# Patient Record
Sex: Male | Born: 1945 | Race: White | Hispanic: No | Marital: Married | Smoking: Never smoker
Health system: Southern US, Community
[De-identification: ages and names within clinical notes are randomized; demographics above are authoritative.]

## PROBLEM LIST (undated history)

## (undated) DIAGNOSIS — R634 Abnormal weight loss: Secondary | ICD-10-CM

## (undated) DIAGNOSIS — Z9889 Other specified postprocedural states: Secondary | ICD-10-CM

## (undated) DIAGNOSIS — E119 Type 2 diabetes mellitus without complications: Secondary | ICD-10-CM

## (undated) DIAGNOSIS — I10 Essential (primary) hypertension: Secondary | ICD-10-CM

## (undated) DIAGNOSIS — L299 Pruritus, unspecified: Secondary | ICD-10-CM

## (undated) DIAGNOSIS — N058 Unspecified nephritic syndrome with other morphologic changes: Secondary | ICD-10-CM

## (undated) DIAGNOSIS — K831 Obstruction of bile duct: Secondary | ICD-10-CM

## (undated) DIAGNOSIS — C259 Malignant neoplasm of pancreas, unspecified: Secondary | ICD-10-CM

## (undated) DIAGNOSIS — R748 Abnormal levels of other serum enzymes: Secondary | ICD-10-CM

## (undated) DIAGNOSIS — C4490 Unspecified malignant neoplasm of skin, unspecified: Secondary | ICD-10-CM

## (undated) DIAGNOSIS — E785 Hyperlipidemia, unspecified: Secondary | ICD-10-CM

## (undated) DIAGNOSIS — R112 Nausea with vomiting, unspecified: Secondary | ICD-10-CM

## (undated) DIAGNOSIS — G2581 Restless legs syndrome: Secondary | ICD-10-CM

## (undated) DIAGNOSIS — K219 Gastro-esophageal reflux disease without esophagitis: Secondary | ICD-10-CM

## (undated) HISTORY — DX: Essential (primary) hypertension: I10

## (undated) HISTORY — PX: TONSILLECTOMY: SUR1361

## (undated) HISTORY — PX: INGUINAL HERNIA REPAIR: SUR1180

## (undated) HISTORY — PX: COLONOSCOPY: SHX174

## (undated) HISTORY — PX: BACK SURGERY: SHX140

## (undated) HISTORY — PX: WISDOM TOOTH EXTRACTION: SHX21

## (undated) HISTORY — DX: Hyperlipidemia, unspecified: E78.5

---

## 1992-10-22 HISTORY — PX: ANTERIOR CERVICAL DECOMP/DISCECTOMY FUSION: SHX1161

## 2005-07-05 ENCOUNTER — Ambulatory Visit (HOSPITAL_BASED_OUTPATIENT_CLINIC_OR_DEPARTMENT_OTHER): Admission: RE | Admit: 2005-07-05 | Discharge: 2005-07-05 | Payer: Self-pay | Admitting: Orthopedic Surgery

## 2005-07-05 ENCOUNTER — Ambulatory Visit (HOSPITAL_COMMUNITY): Admission: RE | Admit: 2005-07-05 | Discharge: 2005-07-05 | Payer: Self-pay | Admitting: Orthopedic Surgery

## 2006-11-08 ENCOUNTER — Ambulatory Visit (HOSPITAL_COMMUNITY): Admission: RE | Admit: 2006-11-08 | Discharge: 2006-11-08 | Payer: Self-pay | Admitting: *Deleted

## 2012-10-22 HISTORY — PX: SHOULDER ARTHROSCOPY W/ ROTATOR CUFF REPAIR: SHX2400

## 2015-05-23 ENCOUNTER — Other Ambulatory Visit: Payer: Self-pay | Admitting: Family Medicine

## 2015-05-24 ENCOUNTER — Other Ambulatory Visit: Payer: Self-pay | Admitting: Family Medicine

## 2015-05-24 NOTE — Telephone Encounter (Signed)
ok 

## 2015-05-24 NOTE — Telephone Encounter (Signed)
Pt called and states that he is a friend of yours and would like to have this medication filled. He has NOT been seen here but does have an appt scheduled for later this month. ? OK to Refill

## 2015-05-25 ENCOUNTER — Other Ambulatory Visit: Payer: Self-pay | Admitting: Family Medicine

## 2015-05-25 MED ORDER — INSULIN ASPART 100 UNIT/ML FLEXPEN: 20.0000 [IU] | PEN_INJECTOR | Freq: Two times a day (BID) | SUBCUTANEOUS | Status: DC

## 2015-05-25 MED ORDER — INSULIN DETEMIR 100 UNIT/ML FLEXPEN: 20.0000 [IU] | PEN_INJECTOR | Freq: Every day | SUBCUTANEOUS | Status: DC

## 2015-05-25 NOTE — Telephone Encounter (Signed)
Medication refilled per protocol.  Verbal OK from provider

## 2015-06-16 ENCOUNTER — Encounter: Payer: Self-pay | Admitting: Family Medicine

## 2015-06-16 ENCOUNTER — Ambulatory Visit (INDEPENDENT_AMBULATORY_CARE_PROVIDER_SITE_OTHER): Payer: Medicare Other | Admitting: Family Medicine

## 2015-06-16 VITALS — BP 104/68 | HR 62 | Temp 98.2°F | Resp 14 | Ht 72.0 in | Wt 176.0 lb

## 2015-06-16 DIAGNOSIS — Z794 Long term (current) use of insulin: Secondary | ICD-10-CM | POA: Diagnosis not present

## 2015-06-16 DIAGNOSIS — Z7189 Other specified counseling: Secondary | ICD-10-CM

## 2015-06-16 DIAGNOSIS — Z23 Encounter for immunization: Secondary | ICD-10-CM

## 2015-06-16 DIAGNOSIS — Z0000 Encounter for general adult medical examination without abnormal findings: Secondary | ICD-10-CM | POA: Diagnosis not present

## 2015-06-16 DIAGNOSIS — Z7689 Persons encountering health services in other specified circumstances: Secondary | ICD-10-CM

## 2015-06-16 DIAGNOSIS — E119 Type 2 diabetes mellitus without complications: Secondary | ICD-10-CM

## 2015-06-16 DIAGNOSIS — IMO0001 Reserved for inherently not codable concepts without codable children: Secondary | ICD-10-CM

## 2015-06-16 LAB — LIPID PANEL
Cholesterol: 151 mg/dL (ref 125–200)
HDL: 44 mg/dL (ref >=40)
LDL CALC: 90 mg/dL (ref <130)
Total CHOL/HDL Ratio: 3.4 Ratio (ref <=5.0)
Triglycerides: 87 mg/dL (ref <150)
VLDL: 17 mg/dL (ref <30)

## 2015-06-16 LAB — CBC WITH DIFFERENTIAL/PLATELET
BASOS ABS: 0.1 10*3/uL (ref 0.0–0.1)
Basophils Relative: 1 % (ref 0–1)
EOS ABS: 0.3 10*3/uL (ref 0.0–0.7)
Eosinophils Relative: 4 % (ref 0–5)
HCT: 42 % (ref 39.0–52.0)
Hemoglobin: 13.9 g/dL (ref 13.0–17.0)
Lymphocytes Relative: 20 % (ref 12–46)
Lymphs Abs: 1.6 10*3/uL (ref 0.7–4.0)
MCH: 29.1 pg (ref 26.0–34.0)
MCHC: 33.1 g/dL (ref 30.0–36.0)
MCV: 88.1 fL (ref 78.0–100.0)
MPV: 9.7 fL (ref 8.6–12.4)
Monocytes Absolute: 0.8 10*3/uL (ref 0.1–1.0)
Monocytes Relative: 10 % (ref 3–12)
Neutro Abs: 5.3 10*3/uL (ref 1.7–7.7)
Neutrophils Relative %: 65 % (ref 43–77)
PLATELETS: 210 10*3/uL (ref 150–400)
RBC: 4.77 MIL/uL (ref 4.22–5.81)
RDW: 14.3 % (ref 11.5–15.5)
WBC: 8.1 10*3/uL (ref 4.0–10.5)

## 2015-06-16 LAB — COMPLETE METABOLIC PANEL WITH GFR
ALT: 25 U/L (ref 9–46)
AST: 25 U/L (ref 10–35)
Albumin: 4.5 g/dL (ref 3.6–5.1)
Alkaline Phosphatase: 100 U/L (ref 40–115)
BUN: 26 mg/dL — ABNORMAL HIGH (ref 7–25)
CO2: 25 mmol/L (ref 20–31)
Calcium: 9.5 mg/dL (ref 8.6–10.3)
Chloride: 100 mmol/L (ref 98–110)
Creat: 1.3 mg/dL — ABNORMAL HIGH (ref 0.70–1.25)
GFR, EST NON AFRICAN AMERICAN: 56 mL/min — AB (ref >=60)
GFR, Est African American: 65 mL/min (ref >=60)
GLUCOSE: 162 mg/dL — AB (ref 70–99)
Potassium: 5.2 mmol/L (ref 3.5–5.3)
SODIUM: 138 mmol/L (ref 135–146)
TOTAL PROTEIN: 7.1 g/dL (ref 6.1–8.1)
Total Bilirubin: 0.6 mg/dL (ref 0.2–1.2)

## 2015-06-16 LAB — HEMOGLOBIN A1C
HEMOGLOBIN A1C: 8.3 % — AB (ref <5.7)
Mean Plasma Glucose: 192 mg/dL — ABNORMAL HIGH (ref <117)

## 2015-06-16 NOTE — Progress Notes (Signed)
Subjective:    Patient ID: Jacob Hardin, male    DOB: 06-25-1946, 69 y.o.   MRN: [id number]  HPI  Patient is a very pleasant 69 year old white male who arrives known since I was a child. 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 Past medical history is significant for hypertension, hyperlipidemia, and insulin-dependent diabetes mellitus. Patient states that up until 2 weeks ago, his fasting blood sugars are all less than 130. However he recently went on a cruise to [place] and he admits to dietary indiscretion and inactivity. With that, his fasting blood sugars have risen substantially but this is just been recently. He denies any hypoglycemia. He denies any chest pain shortness of breath or dyspnea on exertion. Patient has had Pneumovax 23 as well as the shingles vaccine. He is due for Prevnar 13. He states that his prostate exam and PSA have been less than 12 months ago and we will defer that today. He had a colonoscopy within the last 5 years and was told that he is good for 10 years. Past Medical History  Diagnosis Date  . Diabetes mellitus without complication   . Hypertension   . Hyperlipidemia    History reviewed. No pertinent past surgical history. Current Outpatient Prescriptions on File Prior to Visit  Medication Sig Dispense Refill  . insulin aspart (NOVOLOG) 100 UNIT/ML FlexPen Inject 20 Units into the skin 2 (two) times daily with a meal. (Patient taking differently: Inject 8 Units into the skin daily. Before dinner) 15 mL 0  . Insulin Detemir (LEVEMIR FLEXTOUCH) 100 UNIT/ML Pen Inject 20 Units into the skin daily at 10 pm. (Patient taking differently: Inject 20 Units into the skin daily with breakfast. ) 15 mL 0   No current facility-administered medications on file prior to visit.   Allergies  Allergen Reactions  . Codeine Nausea Only   Social History   Social History  . Marital Status: Married    Spouse Name: N/A  . Number of  Children: N/A  . Years of Education: N/A   Occupational History  . Not on file.   Social History Main Topics  . Smoking status: Never Smoker   . Smokeless tobacco: Never Used  . Alcohol Use: No  . Drug Use: No  . Sexual Activity: Yes     Comment: married to [place].  Retired Corporate investment banker   Other Topics Concern  . Not on file   Social History Narrative  . No narrative on file   Family History  Problem Relation Age of Onset  . Diabetes Mother   . Hypertension Mother   . Cancer Father     lung 59  . Hypertension Father   . Cancer Brother     pancreatic   . Cancer Paternal Uncle     prostate  . Cancer Paternal Grandfather     colon     Review of Systems  All other systems reviewed and are negative.      Objective:   Physical Exam  Constitutional: He is oriented to person, place, and time. He appears well-developed and well-nourished. No distress.  HENT:  Head: Normocephalic and atraumatic.  Right Ear: External ear normal.  Left Ear: External ear normal.  Nose: Nose normal.  Mouth/Throat: Oropharynx is clear and moist. No oropharyngeal exudate.  Eyes: Conjunctivae and EOM are normal. Pupils are equal, round, and reactive to light. Right eye exhibits no discharge. Left eye  exhibits no discharge. No scleral icterus.  Neck: Normal range of motion. Neck supple. No JVD present. No tracheal deviation present. No thyromegaly present.  Cardiovascular: Normal rate, regular rhythm, normal heart sounds and intact distal pulses.  Exam reveals no gallop and no friction rub.   No murmur heard. Pulmonary/Chest: Effort normal and breath sounds normal. No stridor. No respiratory distress. He has no wheezes. He has no rales. He exhibits no tenderness.  Abdominal: Soft. Bowel sounds are normal. He exhibits no distension and no mass. There is no tenderness. There is no rebound and no guarding.  Musculoskeletal: Normal range of motion. He exhibits no edema or  tenderness.  Lymphadenopathy:    He has no cervical adenopathy.  Neurological: He is alert and oriented to person, place, and time. He has normal reflexes. He displays normal reflexes. No cranial nerve deficit. He exhibits normal muscle tone. Coordination normal.  Skin: Skin is warm. No rash noted. He is not diaphoretic. No erythema. No pallor.  Psychiatric: He has a normal mood and affect. His behavior is normal. Judgment and thought content normal.  Vitals reviewed.         Assessment & Plan:  IDDM (insulin dependent diabetes mellitus) - Plan: CBC with Differential/Platelet, COMPLETE METABOLIC PANEL WITH GFR, Lipid panel, Hemoglobin A1c, Microalbumin, urine  Routine general medical examination at a health care facility - Plan: PSA, Medicare  Establishing care with new doctor, encounter for  patient's blood pressure is outstanding. I will check a CBC, CMP, fasting lipid panel, hemoglobin A1c, and urine microalbumin. I will defer the rectal exam and a PSA until his follow-up visit in 3 months. I recommended diet exercise and lifestyle changes to address his increased fasting blood sugar. He is to check his sugar twice daily and notify me in one month. If the sugars not starting to decline, I would increase his Levemir until his fasting blood sugar was less than 130. Patient will receive Prevnar 13 today. The remainder of his preventative care is up-to-date.

## 2015-06-17 LAB — MICROALBUMIN, URINE: Microalb, Ur: 5.5 mg/dL — ABNORMAL HIGH (ref <2.0)

## 2015-06-22 ENCOUNTER — Encounter: Payer: Self-pay | Admitting: Family Medicine

## 2015-06-22 DIAGNOSIS — E785 Hyperlipidemia, unspecified: Secondary | ICD-10-CM | POA: Insufficient documentation

## 2015-06-22 DIAGNOSIS — E119 Type 2 diabetes mellitus without complications: Secondary | ICD-10-CM | POA: Insufficient documentation

## 2015-06-22 DIAGNOSIS — I10 Essential (primary) hypertension: Secondary | ICD-10-CM | POA: Insufficient documentation

## 2015-07-26 ENCOUNTER — Other Ambulatory Visit: Payer: Self-pay | Admitting: Family Medicine

## 2015-07-28 ENCOUNTER — Other Ambulatory Visit: Payer: Self-pay | Admitting: Family Medicine

## 2015-07-28 NOTE — Telephone Encounter (Signed)
Refill appropriate and filled per protocol. 

## 2015-09-16 ENCOUNTER — Ambulatory Visit: Payer: Medicare Other | Admitting: Family Medicine

## 2015-09-19 ENCOUNTER — Encounter: Payer: Self-pay | Admitting: Family Medicine

## 2015-09-19 ENCOUNTER — Ambulatory Visit (INDEPENDENT_AMBULATORY_CARE_PROVIDER_SITE_OTHER): Payer: Medicare Other | Admitting: Family Medicine

## 2015-09-19 VITALS — BP 152/78 | HR 74 | Temp 97.7°F | Resp 16 | Ht 72.0 in | Wt 177.0 lb

## 2015-09-19 DIAGNOSIS — Z794 Long term (current) use of insulin: Secondary | ICD-10-CM | POA: Diagnosis not present

## 2015-09-19 DIAGNOSIS — I10 Essential (primary) hypertension: Secondary | ICD-10-CM | POA: Diagnosis not present

## 2015-09-19 DIAGNOSIS — E119 Type 2 diabetes mellitus without complications: Secondary | ICD-10-CM | POA: Diagnosis not present

## 2015-09-19 LAB — CBC WITH DIFFERENTIAL/PLATELET
BASOS ABS: 0 10*3/uL (ref 0.0–0.1)
Basophils Relative: 0 % (ref 0–1)
EOS ABS: 0.2 10*3/uL (ref 0.0–0.7)
EOS PCT: 2 % (ref 0–5)
HEMATOCRIT: 39.8 % (ref 39.0–52.0)
Hemoglobin: 13.4 g/dL (ref 13.0–17.0)
LYMPHS ABS: 1.6 10*3/uL (ref 0.7–4.0)
LYMPHS PCT: 18 % (ref 12–46)
MCH: 29.3 pg (ref 26.0–34.0)
MCHC: 33.7 g/dL (ref 30.0–36.0)
MCV: 87.1 fL (ref 78.0–100.0)
MONOS PCT: 10 % (ref 3–12)
MPV: 9.3 fL (ref 8.6–12.4)
Monocytes Absolute: 0.9 10*3/uL (ref 0.1–1.0)
Neutro Abs: 6.3 10*3/uL (ref 1.7–7.7)
Neutrophils Relative %: 70 % (ref 43–77)
Platelets: 250 10*3/uL (ref 150–400)
RBC: 4.57 MIL/uL (ref 4.22–5.81)
RDW: 14.3 % (ref 11.5–15.5)
WBC: 9 10*3/uL (ref 4.0–10.5)

## 2015-09-19 LAB — COMPLETE METABOLIC PANEL WITH GFR
ALT: 23 U/L (ref 9–46)
AST: 27 U/L (ref 10–35)
Albumin: 4.5 g/dL (ref 3.6–5.1)
Alkaline Phosphatase: 174 U/L — ABNORMAL HIGH (ref 40–115)
BILIRUBIN TOTAL: 0.5 mg/dL (ref 0.2–1.2)
BUN: 15 mg/dL (ref 7–25)
CALCIUM: 9.9 mg/dL (ref 8.6–10.3)
CO2: 28 mmol/L (ref 20–31)
CREATININE: 0.97 mg/dL (ref 0.70–1.25)
Chloride: 101 mmol/L (ref 98–110)
GFR, EST NON AFRICAN AMERICAN: 80 mL/min (ref >=60)
Glucose, Bld: 155 mg/dL — ABNORMAL HIGH (ref 70–99)
Potassium: 5.4 mmol/L — ABNORMAL HIGH (ref 3.5–5.3)
Sodium: 140 mmol/L (ref 135–146)
TOTAL PROTEIN: 7.1 g/dL (ref 6.1–8.1)

## 2015-09-19 LAB — HEMOGLOBIN A1C
HEMOGLOBIN A1C: 7 % — AB (ref <5.7)
MEAN PLASMA GLUCOSE: 154 mg/dL — AB (ref <117)

## 2015-09-19 LAB — LIPID PANEL
CHOLESTEROL: 141 mg/dL (ref 125–200)
HDL: 44 mg/dL (ref >=40)
LDL Cholesterol: 80 mg/dL (ref <130)
Total CHOL/HDL Ratio: 3.2 Ratio (ref <=5.0)
Triglycerides: 85 mg/dL (ref <150)
VLDL: 17 mg/dL (ref <30)

## 2015-09-19 NOTE — Progress Notes (Signed)
Subjective:    Patient ID: Jacob Hardin, male    DOB: 05/09/1946, 69 y.o.   MRN: [id number]  HPI  06/16/15 Patient is a very pleasant 69 year old white male who arrives known since I was a child. 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 Past medical history is significant for hypertension, hyperlipidemia, and insulin-dependent diabetes mellitus. Patient states that up until 2 weeks ago, his fasting blood sugars are all less than 130. However he recently went on a cruise to [place] and he admits to dietary indiscretion and inactivity. With that, his fasting blood sugars have risen substantially but this is just been recently. He denies any hypoglycemia. He denies any chest pain shortness of breath or dyspnea on exertion. Patient has had Pneumovax 23 as well as the shingles vaccine. He is due for Prevnar 13. He states that his prostate exam and PSA have been less than 12 months ago and we will defer that today. He had a colonoscopy within the last 5 years and was told that he is good for 10 years.  At that time, my plan was: patient's blood pressure is outstanding. I will check a CBC, CMP, fasting lipid panel, hemoglobin A1c, and urine microalbumin. I will defer the rectal exam and a PSA until his follow-up visit in 3 months. I recommended diet exercise and lifestyle changes to address his increased fasting blood sugar. He is to check his sugar twice daily and notify me in one month. If the sugars not starting to decline, I would increase his Levemir until his fasting blood sugar was less than 130. Patient will receive Prevnar 13 today. The remainder of his preventative care is up-to-date.  09/19/15 Here today for follow up.  In August, HgA1c was 8.3 and the remainder of his lab work was normal.  Since I last saw the patient, he has increased his insulin Levemir to 37 units every day. He continues to take 7 units of NovoLog with meals. His fasting blood sugar  ranges between 75 and 145. The majority are between 120 and 130. His two-hour postprandial sugars are between 130 and 160. He does have an occasional 190. However his sugars have improved dramatically since his last office visit. Patient states that he has had his flu shot this year. He is here today to recheck fasting lab work. Past Medical History  Diagnosis Date  . Diabetes mellitus without complication   . Hyperlipidemia   . Hypertension    Past Surgical History  Procedure Laterality Date  . Spine surgery  1994    cervical  . Rotator cuff repair  2014   Current Outpatient Prescriptions on File Prior to Visit  Medication Sig Dispense Refill  . aspirin 81 MG tablet Take 81 mg by mouth daily.    . BD PEN NEEDLE NANO U/F 32G X 4 MM MISC USE TO INJECT INSULIN TWICE A DAY 200 each 3  . Glucosamine-Chondroitin (MOVE FREE PO) Take 2 tablets by mouth every morning.    . insulin aspart (NOVOLOG) 100 UNIT/ML FlexPen Inject 20 Units into the skin 2 (two) times daily with a meal. (Patient taking differently: Inject 8 Units into the skin daily. Before dinner) 15 mL 0  . Insulin Detemir (LEVEMIR FLEXTOUCH) 100 UNIT/ML Pen Inject 20 Units into the skin daily at 10 pm. (Patient taking differently: Inject 20 Units into the skin daily with breakfast. ) 15 mL 0  . JANUMET XR 706 [id number] MG  TB24 Take 1 tablet by mouth daily.  4  . LEVEMIR FLEXTOUCH 100 UNIT/ML Pen INJECT 20 UNITS SUBCUTANEOUSLY DAILY 45 mL 3  . magnesium gluconate (MAGONATE) 500 MG tablet Take 1,000 mg by mouth daily.    . Multiple Vitamin (MULTIVITAMIN) tablet Take 1 tablet by mouth daily. CentrumSilver    . pravastatin (PRAVACHOL) 80 MG tablet Take 80 mg by mouth daily.  3  . telmisartan-hydrochlorothiazide (MICARDIS HCT) 80-12.5 MG per tablet Take 1 tablet by mouth daily.  11  . vitamin C (ASCORBIC ACID) 500 MG tablet Take 500 mg by mouth daily as needed.     No current facility-administered medications on file prior to visit.    Allergies  Allergen Reactions  . Codeine Nausea Only   Social History   Social History  . Marital Status: Married    Spouse Name: N/A  . Number of Children: N/A  . Years of Education: N/A   Occupational History  . Not on file.   Social History Main Topics  . Smoking status: Never Smoker   . Smokeless tobacco: Never Used  . Alcohol Use: No  . Drug Use: No  . Sexual Activity: Yes    Birth Control/ Protection: None     Comment: married to [place].  Retired Corporate investment banker   Other Topics Concern  . Not on file   Social History Narrative   Family History  Problem Relation Age of Onset  . Diabetes Mother   . Hypertension Mother   . Cancer Father     lung 88  . Hypertension Father   . Cancer Brother     pancreatic   . Diabetes Brother   . Cancer Paternal Uncle     prostate  . Cancer Paternal Grandfather     colon     Review of Systems  All other systems reviewed and are negative.      Objective:   Physical Exam  Constitutional: He is oriented to person, place, and time. He appears well-developed and well-nourished. No distress.  HENT:  Head: Normocephalic and atraumatic.  Right Ear: External ear normal.  Left Ear: External ear normal.  Nose: Nose normal.  Mouth/Throat: Oropharynx is clear and moist. No oropharyngeal exudate.  Eyes: Conjunctivae and EOM are normal. Pupils are equal, round, and reactive to light. Right eye exhibits no discharge. Left eye exhibits no discharge. No scleral icterus.  Neck: Normal range of motion. Neck supple. No JVD present. No tracheal deviation present. No thyromegaly present.  Cardiovascular: Normal rate, regular rhythm, normal heart sounds and intact distal pulses.  Exam reveals no gallop and no friction rub.   No murmur heard. Pulmonary/Chest: Effort normal and breath sounds normal. No stridor. No respiratory distress. He has no wheezes. He has no rales. He exhibits no tenderness.  Abdominal: Soft. Bowel  sounds are normal. He exhibits no distension and no mass. There is no tenderness. There is no rebound and no guarding.  Musculoskeletal: Normal range of motion. He exhibits no edema or tenderness.  Lymphadenopathy:    He has no cervical adenopathy.  Neurological: He is alert and oriented to person, place, and time. He has normal reflexes. No cranial nerve deficit. He exhibits normal muscle tone. Coordination normal.  Skin: Skin is warm. No rash noted. He is not diaphoretic. No erythema. No pallor.  Psychiatric: He has a normal mood and affect. His behavior is normal. Judgment and thought content normal.  Vitals reviewed.  Assessment & Plan:  Diabetes mellitus, type II, insulin dependent ([place]) - Plan: CBC with Differential/Platelet, COMPLETE METABOLIC PANEL WITH GFR, Hemoglobin A1c, Lipid panel, Microalbumin, urine  Benign essential HTN  I am very pleased with his blood sugars. I will recheck a hemoglobin A1c. Goal hemoglobin A1c is less than 7. For the last 2 months, his sugars have been within a range that should allow his hemoglobin A1c to fall below 7. Therefore I will make no changes in his insulin at this time. I'll also check a urine microalbumin as well as an LDL cholesterol. Patient recently had Prevnar 57. He reports that his flu shot is up-to-date. His blood pressure today is slightly elevated however on recheck it is normal and he states that it runs normal at home.  He does report a 6 week history of occasional scratchy cough which he attributes to allergies. I recommended trying Zyrtec 10 mg by mouth daily for that.

## 2015-09-20 LAB — MICROALBUMIN, URINE: Microalb, Ur: 5 mg/dL

## 2015-09-28 ENCOUNTER — Encounter: Payer: Self-pay | Admitting: Family Medicine

## 2015-09-30 ENCOUNTER — Other Ambulatory Visit: Payer: Self-pay | Admitting: Family Medicine

## 2015-09-30 DIAGNOSIS — R748 Abnormal levels of other serum enzymes: Secondary | ICD-10-CM

## 2015-10-25 ENCOUNTER — Other Ambulatory Visit: Payer: Self-pay | Admitting: Family Medicine

## 2015-10-25 NOTE — Telephone Encounter (Signed)
Medication called/sent to requested pharmacy  

## 2015-11-03 ENCOUNTER — Other Ambulatory Visit: Payer: Medicare Other

## 2015-11-03 DIAGNOSIS — R748 Abnormal levels of other serum enzymes: Secondary | ICD-10-CM

## 2015-11-03 LAB — COMPLETE METABOLIC PANEL WITH GFR
ALT: 150 U/L — AB (ref 9–46)
AST: 107 U/L — ABNORMAL HIGH (ref 10–35)
Albumin: 4.2 g/dL (ref 3.6–5.1)
Alkaline Phosphatase: 365 U/L — ABNORMAL HIGH (ref 40–115)
BUN: 20 mg/dL (ref 7–25)
CALCIUM: 9.5 mg/dL (ref 8.6–10.3)
CHLORIDE: 100 mmol/L (ref 98–110)
CO2: 28 mmol/L (ref 20–31)
CREATININE: 0.98 mg/dL (ref 0.70–1.25)
GFR, EST NON AFRICAN AMERICAN: 79 mL/min (ref >=60)
Glucose, Bld: 101 mg/dL — ABNORMAL HIGH (ref 70–99)
Potassium: 4.3 mmol/L (ref 3.5–5.3)
Sodium: 139 mmol/L (ref 135–146)
Total Bilirubin: 0.7 mg/dL (ref 0.2–1.2)
Total Protein: 6.8 g/dL (ref 6.1–8.1)

## 2015-11-04 ENCOUNTER — Other Ambulatory Visit: Payer: Self-pay | Admitting: Family Medicine

## 2015-11-04 DIAGNOSIS — R945 Abnormal results of liver function studies: Secondary | ICD-10-CM

## 2015-11-07 ENCOUNTER — Telehealth: Payer: Self-pay | Admitting: *Deleted

## 2015-11-07 MED ORDER — INSULIN LISPRO 100 UNIT/ML (KWIKPEN): 4.0000 [IU] | PEN_INJECTOR | Freq: Every day | SUBCUTANEOUS | Status: DC

## 2015-11-07 NOTE — Telephone Encounter (Signed)
Pt wife called and aware of appt

## 2015-11-07 NOTE — Telephone Encounter (Signed)
Pt has appt scheduled for Friday Jan 20th at 10:00am pt is to arrive at 9:40am at [facility] on [address]. Lmtrc to pt for appt information

## 2015-11-07 NOTE — Telephone Encounter (Signed)
Received call from patient wife Butch Penny.   Reports that patient insurance will no longer cover Novolog. States that insurance will cover Humalog.   Requested prescription for Humalog.   Prescription sent to pharmacy.

## 2015-11-10 ENCOUNTER — Ambulatory Visit (INDEPENDENT_AMBULATORY_CARE_PROVIDER_SITE_OTHER): Payer: Medicare Other | Admitting: Family Medicine

## 2015-11-10 ENCOUNTER — Encounter: Payer: Self-pay | Admitting: Family Medicine

## 2015-11-10 VITALS — BP 100/60 | HR 74 | Temp 97.9°F | Resp 14 | Ht 72.0 in | Wt 172.0 lb

## 2015-11-10 DIAGNOSIS — R945 Abnormal results of liver function studies: Principal | ICD-10-CM

## 2015-11-10 DIAGNOSIS — R7989 Other specified abnormal findings of blood chemistry: Secondary | ICD-10-CM | POA: Diagnosis not present

## 2015-11-10 NOTE — Progress Notes (Signed)
Subjective:    Patient ID: Jacob Hardin, male    DOB: 1945/12/01, 70 y.o.   MRN: [id number]  HPI  06/16/15 Patient is a very pleasant 70 year old white male who arrives known since I was a child. 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 Past medical history is significant for hypertension, hyperlipidemia, and insulin-dependent diabetes mellitus. Patient states that up until 2 weeks ago, his fasting blood sugars are all less than 130. However he recently went on a cruise to [place] and he admits to dietary indiscretion and inactivity. With that, his fasting blood sugars have risen substantially but this is just been recently. He denies any hypoglycemia. He denies any chest pain shortness of breath or dyspnea on exertion. Patient has had Pneumovax 23 as well as the shingles vaccine. He is due for Prevnar 13. He states that his prostate exam and PSA have been less than 12 months ago and we will defer that today. He had a colonoscopy within the last 5 years and was told that he is good for 10 years.  At that time, my plan was: patient's blood pressure is outstanding. I will check a CBC, CMP, fasting lipid panel, hemoglobin A1c, and urine microalbumin. I will defer the rectal exam and a PSA until his follow-up visit in 3 months. I recommended diet exercise and lifestyle changes to address his increased fasting blood sugar. He is to check his sugar twice daily and notify me in one month. If the sugars not starting to decline, I would increase his Levemir until his fasting blood sugar was less than 130. Patient will receive Prevnar 13 today. The remainder of his preventative care is up-to-date.  09/19/15 Here today for follow up.  In August, HgA1c was 8.3 and the remainder of his lab work was normal.  Since I last saw the patient, he has increased his insulin Levemir to 37 units every day. He continues to take 7 units of NovoLog with meals. His fasting blood sugar  ranges between 75 and 145. The majority are between 120 and 130. His two-hour postprandial sugars are between 130 and 160. He does have an occasional 190. However his sugars have improved dramatically since his last office visit. Patient states that he has had his flu shot this year. He is here today to recheck fasting lab work.  At that time, my plan was: I am very pleased with his blood sugars. I will recheck a hemoglobin A1c. Goal hemoglobin A1c is less than 7. For the last 2 months, his sugars have been within a range that should allow his hemoglobin A1c to fall below 7. Therefore I will make no changes in his insulin at this time. I'll also check a urine microalbumin as well as an LDL cholesterol. Patient recently had Prevnar 63. He reports that his flu shot is up-to-date. His blood pressure today is slightly elevated however on recheck it is normal and he states that it runs normal at home.  He does report a 6 week history of occasional scratchy cough which he attributes to allergies. I recommended trying Zyrtec 10 mg by mouth daily for that.   11/10/15 Appointment on 11/03/2015  Component Date Value Ref Range Status  . Sodium 11/03/2015 139  135 - 146 mmol/L Final  . Potassium 11/03/2015 4.3  3.5 - 5.3 mmol/L Final  . Chloride 11/03/2015 100  98 - 110 mmol/L Final  . CO2 11/03/2015 28  20 -  31 mmol/L Final  . Glucose, Bld 11/03/2015 101* 70 - 99 mg/dL Final  . BUN 11/03/2015 20  7 - 25 mg/dL Final  . Creat 11/03/2015 0.98  0.70 - 1.25 mg/dL Final  . Total Bilirubin 11/03/2015 0.7  0.2 - 1.2 mg/dL Final  . Alkaline Phosphatase 11/03/2015 365* 40 - 115 U/L Final  . AST 11/03/2015 107* 10 - 35 U/L Final  . ALT 11/03/2015 150* 9 - 46 U/L Final  . Total Protein 11/03/2015 6.8  6.1 - 8.1 g/dL Final  . Albumin 11/03/2015 4.2  3.6 - 5.1 g/dL Final  . Calcium 11/03/2015 9.5  8.6 - 10.3 mg/dL Final  . GFR, Est African American 11/03/2015 >89  >=60 mL/min Final  . GFR, Est Non African American  11/03/2015 79  >=60 mL/min Final   Comment:   The estimated GFR is a calculation valid for adults (>=19 years old) that uses the CKD-EPI algorithm to adjust for age and sex. It is   not to be used for children, pregnant women, hospitalized patients,    patients on dialysis, or with rapidly changing kidney function. According to the NKDEP, eGFR >89 is normal, 60-89 shows mild impairment, 30-59 shows moderate impairment, 15-29 shows severe impairment and <15 is ESRD.     Liver function tests have subsequently returned elevated prompting me to have him hold pravastatin and order RUQ [place] and viral hepatitis panel.  Since I last saw Mortimer Fries, he has lost 5 pounds. He states that he has very little appetite. He denies any abdominal pain. He denies any black tarry stools or blood in his stool. He denies any fevers. However with the weight loss, his blood pressure is extremely low at 100/60. He does report symptoms consistent with orthostatic hypotension. He has discontinued Tylenol as well as pravastatin. He is also decreased his quick-acting insulin due to hypoglycemia which he has experienced due to the decreased appetite. He has right upper quadrant ultrasound scheduled for first thing in the morning. Viral hepatitis panel has not been checked yet.  Past Medical History  Diagnosis Date  . Diabetes mellitus without complication ([place])   . Hyperlipidemia   . Hypertension    Past Surgical History  Procedure Laterality Date  . Spine surgery  1994    cervical  . Rotator cuff repair  2014   Current Outpatient Prescriptions on File Prior to Visit  Medication Sig Dispense Refill  . aspirin 81 MG tablet Take 81 mg by mouth daily.    . BD PEN NEEDLE NANO U/F 32G X 4 MM MISC USE TO INJECT INSULIN TWICE A DAY 200 each 3  . Glucosamine-Chondroitin (MOVE FREE PO) Take 2 tablets by mouth every morning.    . Insulin Detemir (LEVEMIR FLEXTOUCH) 100 UNIT/ML Pen Inject 20 Units into the skin daily at 10 pm.  (Patient taking differently: Inject 37 Units into the skin daily with breakfast. ) 15 mL 0  . insulin lispro (HUMALOG KWIKPEN) 100 UNIT/ML KiwkPen Inject 0.04 mLs (4 Units total) into the skin daily. 15 mL 11  . JANUMET XR [phone] MG TB24 Take 1 tablet by mouth daily.  4  . magnesium gluconate (MAGONATE) 500 MG tablet Take 1,000 mg by mouth daily.    . Multiple Vitamin (MULTIVITAMIN) tablet Take 1 tablet by mouth daily. CentrumSilver    . pravastatin (PRAVACHOL) 80 MG tablet Take 80 mg by mouth daily.  3  . telmisartan-hydrochlorothiazide (MICARDIS HCT) 80-12.5 MG tablet TAKE 1 TABLET DAILY 30 tablet 11  .  vitamin C (ASCORBIC ACID) 500 MG tablet Take 500 mg by mouth daily as needed.     No current facility-administered medications on file prior to visit.   Allergies  Allergen Reactions  . Codeine Nausea Only   Social History   Social History  . Marital Status: Married    Spouse Name: N/A  . Number of Children: N/A  . Years of Education: N/A   Occupational History  . Not on file.   Social History Main Topics  . Smoking status: Never Smoker   . Smokeless tobacco: Never Used  . Alcohol Use: No  . Drug Use: No  . Sexual Activity: Yes    Birth Control/ Protection: None     Comment: married to [place].  Retired Corporate investment banker   Other Topics Concern  . Not on file   Social History Narrative   Family History  Problem Relation Age of Onset  . Diabetes Mother   . Hypertension Mother   . Cancer Father     lung 73  . Hypertension Father   . Cancer Brother     pancreatic   . Diabetes Brother   . Cancer Paternal Uncle     prostate  . Cancer Paternal Grandfather     colon     Review of Systems  All other systems reviewed and are negative.      Objective:   Physical Exam  Constitutional: He is oriented to person, place, and time. He appears well-developed and well-nourished. No distress.  HENT:  Head: Normocephalic and atraumatic.  Right Ear:  External ear normal.  Left Ear: External ear normal.  Nose: Nose normal.  Mouth/Throat: Oropharynx is clear and moist. No oropharyngeal exudate.  Eyes: Conjunctivae and EOM are normal. Pupils are equal, round, and reactive to light. Right eye exhibits no discharge. Left eye exhibits no discharge. No scleral icterus.  Neck: Normal range of motion. Neck supple. No JVD present. No tracheal deviation present. No thyromegaly present.  Cardiovascular: Normal rate, regular rhythm, normal heart sounds and intact distal pulses.  Exam reveals no gallop and no friction rub.   No murmur heard. Pulmonary/Chest: Effort normal and breath sounds normal. No stridor. No respiratory distress. He has no wheezes. He has no rales. He exhibits no tenderness.  Abdominal: Soft. Bowel sounds are normal. He exhibits no distension and no mass. There is no tenderness. There is no rebound and no guarding.  Musculoskeletal: Normal range of motion. He exhibits no edema or tenderness.  Lymphadenopathy:    He has no cervical adenopathy.  Neurological: He is alert and oriented to person, place, and time. He has normal reflexes. No cranial nerve deficit. He exhibits normal muscle tone. Coordination normal.  Skin: Skin is warm. No rash noted. He is not diaphoretic. No erythema. No pallor.  Psychiatric: He has a normal mood and affect. His behavior is normal. Judgment and thought content normal.  Vitals reviewed.         Assessment & Plan:  Elevated LFTs - Plan: AFP tumor marker, Hepatitis panel, acute, ANA, CMV IgM, CBC with Differential/Platelet  While I am hopeful that the elevated liver function tests may be due to the Tylenol and the pravastatin and that stopping the medication may solve the problem, I am concerned by the weight loss. I will await to see the results of the ultrasound. I have asked the patient to stop his blood pressure medication given his low blood pressure and to discontinue his rapid acting  insulin  until his appetite improves. I will check a viral hepatitis panel although the patient has no risk factors for hepatitis B or hepatitis C. I will check an AFP in case there is a mass on ultrasound. I will check an ANA to screen for autoimmune hepatitis. If the ANA is positive we may want to do follow-up test to evaluate for autoimmune hepatitis including anti-liver kidney microsomal antibodies and anti-smooth muscle antibodies. I will also check a CMV IgM level to evaluate for cytomegalovirus. If there is a mass on ultrasound I'll schedule the patient for a biopsy as soon as possible. If the lab work is negative and there is no mass on ultrasound, we may need to schedule a liver biopsy to evaluate further.

## 2015-11-11 ENCOUNTER — Other Ambulatory Visit: Payer: Self-pay | Admitting: Family Medicine

## 2015-11-11 ENCOUNTER — Ambulatory Visit
Admission: RE | Admit: 2015-11-11 | Discharge: 2015-11-11 | Disposition: A | Discharge status: Home / Self Care | Payer: Medicare Other | Source: Ambulatory Visit | Attending: Family Medicine | Admitting: Family Medicine

## 2015-11-11 DIAGNOSIS — R945 Abnormal results of liver function studies: Secondary | ICD-10-CM

## 2015-11-11 DIAGNOSIS — R160 Hepatomegaly, not elsewhere classified: Secondary | ICD-10-CM

## 2015-11-11 LAB — CBC WITH DIFFERENTIAL/PLATELET
Basophils Absolute: 0 10*3/uL (ref 0.0–0.1)
Basophils Relative: 0 % (ref 0–1)
Eosinophils Absolute: 0.2 10*3/uL (ref 0.0–0.7)
Eosinophils Relative: 2 % (ref 0–5)
HEMATOCRIT: 34.2 % — AB (ref 39.0–52.0)
HEMOGLOBIN: 11.3 g/dL — AB (ref 13.0–17.0)
LYMPHS PCT: 18 % (ref 12–46)
Lymphs Abs: 1.6 10*3/uL (ref 0.7–4.0)
MCH: 28.9 pg (ref 26.0–34.0)
MCHC: 33 g/dL (ref 30.0–36.0)
MCV: 87.5 fL (ref 78.0–100.0)
MONO ABS: 1 10*3/uL (ref 0.1–1.0)
MONOS PCT: 11 % (ref 3–12)
MPV: 9.5 fL (ref 8.6–12.4)
NEUTROS ABS: 6.1 10*3/uL (ref 1.7–7.7)
Neutrophils Relative %: 69 % (ref 43–77)
Platelets: 259 10*3/uL (ref 150–400)
RBC: 3.91 MIL/uL — ABNORMAL LOW (ref 4.22–5.81)
RDW: 14.5 % (ref 11.5–15.5)
WBC: 8.9 10*3/uL (ref 4.0–10.5)

## 2015-11-11 LAB — HEPATITIS PANEL, ACUTE
HCV AB: NEGATIVE
HEP B C IGM: NONREACTIVE
HEP B S AG: NEGATIVE
Hep A IgM: NONREACTIVE

## 2015-11-11 LAB — AFP TUMOR MARKER: AFP TUMOR MARKER: 1.8 ng/mL (ref <6.1)

## 2015-11-11 LAB — CMV IGM: CMV IgM: <8 AU/mL (ref <30.00)

## 2015-11-11 LAB — ANA: Anti Nuclear Antibody(ANA): POSITIVE — AB

## 2015-11-11 LAB — ANTI-NUCLEAR AB-TITER (ANA TITER)

## 2015-11-14 ENCOUNTER — Telehealth: Payer: Self-pay | Admitting: Family Medicine

## 2015-11-14 ENCOUNTER — Other Ambulatory Visit: Payer: Self-pay | Admitting: Gastroenterology

## 2015-11-14 NOTE — Telephone Encounter (Signed)
Pt's wife Butch Penny called to let Dr. Dennard Schaumann know that they have scheduled his MRI (abdomen) for this coming Wednesday the 25th @ 2:20 pm [id number]

## 2015-11-15 ENCOUNTER — Other Ambulatory Visit: Payer: Self-pay | Admitting: Family Medicine

## 2015-11-15 ENCOUNTER — Telehealth: Payer: Self-pay | Admitting: *Deleted

## 2015-11-15 ENCOUNTER — Encounter (HOSPITAL_COMMUNITY): Payer: Self-pay | Admitting: *Deleted

## 2015-11-15 ENCOUNTER — Ambulatory Visit
Admission: RE | Admit: 2015-11-15 | Discharge: 2015-11-15 | Disposition: A | Discharge status: Home / Self Care | Payer: Medicare Other | Source: Ambulatory Visit | Attending: Family Medicine | Admitting: Family Medicine

## 2015-11-15 DIAGNOSIS — K8689 Other specified diseases of pancreas: Secondary | ICD-10-CM

## 2015-11-15 DIAGNOSIS — R160 Hepatomegaly, not elsewhere classified: Secondary | ICD-10-CM

## 2015-11-15 MED ORDER — GADOBENATE DIMEGLUMINE 529 MG/ML IV SOLN
15.0000 mL | Freq: Once | INTRAVENOUS | Status: AC | PRN
  Administered 2015-11-15: 15 mL via INTRAVENOUS

## 2015-11-15 NOTE — Telephone Encounter (Signed)
Oncology Nurse Navigator Documentation  Oncology Nurse Navigator Flowsheets 11/15/2015  Navigator Location [facility]  Navigator Encounter Type Introductory phone call  Abnormal Finding Date 11/11/2015  Left VM for patient to see Dr. [name] on 11/24/15 at 2:00 pm. Requested return call to confirm. Left my direct # to call.

## 2015-11-15 NOTE — Telephone Encounter (Signed)
Wife returned call and left VM that they will be here on 2/2 at 2 pm to see Dr. [name].

## 2015-11-16 ENCOUNTER — Encounter (HOSPITAL_COMMUNITY): Payer: Self-pay | Admitting: *Deleted

## 2015-11-16 ENCOUNTER — Other Ambulatory Visit: Payer: Medicare Other

## 2015-11-16 NOTE — Progress Notes (Signed)
Pt SDW-pre-op call completed by both patient and pt spouse with consent. Pt denies SOB, chest pain, and being under the care of a cardiologist. Pt denies having an echo and cardiac cath but stated that a stress test was done >10 years ago. Pt spouse stated , " he will not take any Levemir the morning of surgery because he takes it with breakfast, he will be alright." Pt spouse made aware for pt to stop  taking Aspirin, otc vitamins, fish oil and herbal medications. Do not take any NSAIDs ie: Ibuprofen, Advil, Naproxen or any medication containing Aspirin. Pt spouse verbalized understanding of all pre-op instructions.

## 2015-11-18 ENCOUNTER — Encounter (HOSPITAL_COMMUNITY)
Admission: RE | Disposition: A | Discharge status: Home / Self Care | Payer: Self-pay | Source: Ambulatory Visit | Attending: Gastroenterology

## 2015-11-18 ENCOUNTER — Ambulatory Visit (HOSPITAL_COMMUNITY)
Admission: RE | Admit: 2015-11-18 | Discharge: 2015-11-18 | Disposition: A | Discharge status: Home / Self Care | Payer: Medicare Other | Source: Ambulatory Visit | Attending: Gastroenterology | Admitting: Gastroenterology

## 2015-11-18 ENCOUNTER — Ambulatory Visit (HOSPITAL_COMMUNITY): Payer: Medicare Other | Admitting: Certified Registered Nurse Anesthetist

## 2015-11-18 ENCOUNTER — Encounter (HOSPITAL_COMMUNITY): Payer: Self-pay | Admitting: *Deleted

## 2015-11-18 DIAGNOSIS — C170 Malignant neoplasm of duodenum: Secondary | ICD-10-CM | POA: Insufficient documentation

## 2015-11-18 DIAGNOSIS — C787 Secondary malignant neoplasm of liver and intrahepatic bile duct: Secondary | ICD-10-CM | POA: Diagnosis not present

## 2015-11-18 DIAGNOSIS — E785 Hyperlipidemia, unspecified: Secondary | ICD-10-CM | POA: Insufficient documentation

## 2015-11-18 DIAGNOSIS — I10 Essential (primary) hypertension: Secondary | ICD-10-CM | POA: Diagnosis not present

## 2015-11-18 DIAGNOSIS — E119 Type 2 diabetes mellitus without complications: Secondary | ICD-10-CM | POA: Diagnosis not present

## 2015-11-18 DIAGNOSIS — Z800 Family history of malignant neoplasm of digestive organs: Secondary | ICD-10-CM | POA: Diagnosis not present

## 2015-11-18 DIAGNOSIS — K8689 Other specified diseases of pancreas: Secondary | ICD-10-CM

## 2015-11-18 DIAGNOSIS — K831 Obstruction of bile duct: Secondary | ICD-10-CM | POA: Diagnosis not present

## 2015-11-18 HISTORY — PX: ESOPHAGOGASTRODUODENOSCOPY: SHX5428

## 2015-11-18 HISTORY — PX: EUS: SHX5427

## 2015-11-18 LAB — GLUCOSE, CAPILLARY: Glucose-Capillary: 129 mg/dL — ABNORMAL HIGH (ref 65–99)

## 2015-11-18 SURGERY — UPPER ENDOSCOPIC ULTRASOUND (EUS) LINEAR
Anesthesia: General

## 2015-11-18 MED ORDER — SODIUM CHLORIDE 0.9 % IV SOLN: INTRAVENOUS | Status: DC

## 2015-11-18 MED ORDER — ONDANSETRON HCL 4 MG/2ML IJ SOLN
4.0000 mg | Freq: Once | INTRAMUSCULAR | Status: AC
  Administered 2015-11-18: 4 mg via INTRAVENOUS

## 2015-11-18 MED ORDER — INDOMETHACIN 50 MG RE SUPP
RECTAL | Status: AC
  Filled 2015-11-18: qty 2

## 2015-11-18 MED ORDER — ONDANSETRON HCL 4 MG/2ML IJ SOLN
INTRAMUSCULAR | Status: DC | PRN
  Administered 2015-11-18: 4 mg via INTRAVENOUS

## 2015-11-18 MED ORDER — CIPROFLOXACIN IN D5W 400 MG/200ML IV SOLN
400.0000 mg | Freq: Two times a day (BID) | INTRAVENOUS | Status: DC
  Administered 2015-11-18: 400 mg via INTRAVENOUS

## 2015-11-18 MED ORDER — FENTANYL CITRATE (PF) 100 MCG/2ML IJ SOLN
INTRAMUSCULAR | Status: DC | PRN
  Administered 2015-11-18: 50 ug via INTRAVENOUS

## 2015-11-18 MED ORDER — MEPERIDINE HCL 100 MG/ML IJ SOLN: 6.2500 mg | INTRAMUSCULAR | Status: DC | PRN

## 2015-11-18 MED ORDER — DEXAMETHASONE SODIUM PHOSPHATE 4 MG/ML IJ SOLN
INTRAMUSCULAR | Status: DC | PRN
  Administered 2015-11-18: 4 mg via INTRAVENOUS

## 2015-11-18 MED ORDER — CIPROFLOXACIN IN D5W 400 MG/200ML IV SOLN
INTRAVENOUS | Status: AC
  Filled 2015-11-18: qty 200

## 2015-11-18 MED ORDER — LIDOCAINE HCL (CARDIAC) 20 MG/ML IV SOLN
INTRAVENOUS | Status: DC | PRN
  Administered 2015-11-18: 100 mg via INTRAVENOUS

## 2015-11-18 MED ORDER — PROMETHAZINE HCL 25 MG/ML IJ SOLN: 6.2500 mg | INTRAMUSCULAR | Status: DC | PRN

## 2015-11-18 MED ORDER — LACTATED RINGERS IV SOLN
INTRAVENOUS | Status: DC
  Administered 2015-11-18 (×2): via INTRAVENOUS

## 2015-11-18 MED ORDER — ONDANSETRON HCL 4 MG/2ML IJ SOLN
INTRAMUSCULAR | Status: AC
  Filled 2015-11-18: qty 2

## 2015-11-18 MED ORDER — SUCCINYLCHOLINE CHLORIDE 20 MG/ML IJ SOLN
INTRAMUSCULAR | Status: DC | PRN
  Administered 2015-11-18: 100 mg via INTRAVENOUS

## 2015-11-18 MED ORDER — HYDROMORPHONE HCL 1 MG/ML IJ SOLN: 0.2500 mg | INTRAMUSCULAR | Status: DC | PRN

## 2015-11-18 MED ORDER — EPHEDRINE SULFATE 50 MG/ML IJ SOLN
INTRAMUSCULAR | Status: DC | PRN
  Administered 2015-11-18 (×2): 5 mg via INTRAVENOUS
  Administered 2015-11-18: 10 mg via INTRAVENOUS

## 2015-11-18 MED ORDER — PROPOFOL 10 MG/ML IV BOLUS
INTRAVENOUS | Status: DC | PRN
  Administered 2015-11-18: 150 mg via INTRAVENOUS
  Administered 2015-11-18: 50 mg via INTRAVENOUS

## 2015-11-18 NOTE — Anesthesia Procedure Notes (Signed)
Procedure Name: Intubation Date/Time: 11/18/2015 11:27 AM Performed by: Rejeana Brock L Pre-anesthesia Checklist: Patient identified, Timeout performed, Emergency Drugs available, Suction available and Patient being monitored Patient Re-evaluated:Patient Re-evaluated prior to inductionOxygen Delivery Method: Circle system utilized Preoxygenation: Pre-oxygenation with 100% oxygen Intubation Type: IV induction Ventilation: Mask ventilation without difficulty Laryngoscope Size: Mac and 4 Grade View: Grade I Tube type: Oral Tube size: 7.5 mm Number of attempts: 1 Airway Equipment and Method: Stylet Placement Confirmation: ETT inserted through vocal cords under direct vision,  positive ETCO2 and breath sounds checked- equal and bilateral Secured at: 23 cm Tube secured with: Tape Dental Injury: Teeth and Oropharynx as per pre-operative assessment

## 2015-11-18 NOTE — Anesthesia Postprocedure Evaluation (Signed)
Anesthesia Post Note  Patient: Jacob Hardin  Procedure(s) Performed: Procedure(s): UPPER ENDOSCOPIC ULTRASOUND (EUS) LINEAR  Patient location during evaluation: PACU Anesthesia Type: General Level of consciousness: sedated and patient cooperative Pain management: pain level controlled Vital Signs Assessment: post-procedure vital signs reviewed and stable Respiratory status: spontaneous breathing Cardiovascular status: stable Anesthetic complications: no    Last Vitals:  Filed Vitals:   11/18/15 1340 11/18/15 1345  BP:  158/79  Pulse: 70 68  Resp: 11 18    Last Pain: There were no vitals filed for this visit.               Nolon Nations

## 2015-11-18 NOTE — Anesthesia Preprocedure Evaluation (Signed)
Anesthesia Evaluation  Patient identified by MRN, date of birth, ID band Patient awake    Reviewed: Allergy & Precautions, NPO status , Patient's Chart, lab work & pertinent test results  History of Anesthesia Complications (+) PONV and history of anesthetic complications  Airway Mallampati: II  TM Distance: >3 FB Neck ROM: Full    Dental no notable dental hx.    Pulmonary neg pulmonary ROS,    Pulmonary exam normal breath sounds clear to auscultation       Cardiovascular hypertension, Pt. on medications Normal cardiovascular exam Rhythm:Regular Rate:Normal     Neuro/Psych negative neurological ROS  negative psych ROS   GI/Hepatic negative GI ROS, Neg liver ROS,   Endo/Other  negative endocrine ROSdiabetes  Renal/GU negative Renal ROS     Musculoskeletal negative musculoskeletal ROS (+)   Abdominal   Peds  Hematology negative hematology ROS (+)   Anesthesia Other Findings   Reproductive/Obstetrics                             Anesthesia Physical Anesthesia Plan  ASA: II  Anesthesia Plan: General   Post-op Pain Management:    Induction: Intravenous  Airway Management Planned: Oral ETT  Additional Equipment:   Intra-op Plan:   Post-operative Plan: Extubation in OR  Informed Consent: I have reviewed the patients History and Physical, chart, labs and discussed the procedure including the risks, benefits and alternatives for the proposed anesthesia with the patient or authorized representative who has indicated his/her understanding and acceptance.   Dental advisory given  Plan Discussed with: CRNA  Anesthesia Plan Comments:         Anesthesia Quick Evaluation

## 2015-11-18 NOTE — H&P (Signed)
  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  He was noted, incidentally, to have an elevated liver panel.  Initially it was thought that it was secondary to statins, but further evaluation with an ultrasound revealed biliary ductal dilation, a possible mass in the head of the pancreas, and probable metastatic lesions in the liver.  A follow up MRCP was obtained, which confirmed and elucidated the findings of the ultrasound.  He is here today to undergo an ERCP with stent placement to decompress his biliary tract.  No evidence of an ascending cholangitis.  His brother died of pancreatic cancer at the age of 69/58.  Past Medical History  Diagnosis Date  . Diabetes mellitus without complication ([place])   . Hyperlipidemia   . Complication of anesthesia   . PONV (postoperative nausea and vomiting)   . Hypertension     recently weight lost-no meds now-running low.    Past Surgical History  Procedure Laterality Date  . Spine surgery  1994    cervical-bone graft  . Rotator cuff repair Left 2014  . Tonsillectomy    . Hernia repair    . Colonoscopy    . Wisdom tooth extraction      Family History  Problem Relation Age of Onset  . Diabetes Mother   . Hypertension Mother   . Cancer Father     lung 64  . Hypertension Father   . Cancer Brother     pancreatic   . Diabetes Brother   . Cancer Paternal Uncle     prostate  . Cancer Paternal Grandfather     colon    Social History:  reports that he has never smoked. He has never used smokeless tobacco. He reports that he does not drink alcohol or use illicit drugs.  Allergies:  Allergies  Allergen Reactions  . Codeine Nausea Only    Medications:  Scheduled:  Continuous: . sodium chloride    . lactated ringers      No results found for this or any previous visit (from the past 24 hour(s)).   No results found.  ROS:  As stated above in the HPI otherwise negative.  Blood  pressure 148/73, pulse 55, resp. rate 13, SpO2 98 %.    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  Plan: 1) ERCP with stent placement.  Bernie Fobes D 11/18/2015, 10:46 AM

## 2015-11-18 NOTE — Op Note (Signed)
[facility] [place], [id number]   ERCP PROCEDURE REPORT        EXAM DATE: 11/18/2015  PATIENT NAME:          Jacob Hardin, Jacob Hardin          MR #: [id number] BIRTHDATE:       1946-01-31     VISIT #:     [phone] ATTENDING:     Carol Ada, MD     STATUS:     outpatient ASSISTANT:      Jiles Harold and Cleda Daub   INDICATIONS:  The patient is a 70 yr old male here for an ERCP due to biliary obstruction. PROCEDURE PERFORMED:     Aborted ERCP, EGD with biopsies, and EUS with FNA. MEDICATIONS:     General Anesthesia  CONSENT: The patient understands the risks and benefits of the procedure and understands that these risks include, but are not limited to: sedation, allergic reaction, infection, perforation and/or bleeding. Alternative means of evaluation and treatment include, among others: physical exam, x-rays, and/or surgical intervention. The patient elects to proceed with this endoscopic procedure.  DESCRIPTION OF PROCEDURE: During intra-op preparation period all mechanical & medical equipment was checked for proper function. Hand hygiene and appropriate measures for infection prevention was taken. After the risks, benefits and alternatives of the procedure were thoroughly explained, Informed was verified, confirmed and timeout was successfully executed by the treatment team. With the patient in left semi-prone position, medications were administered intravenously.The Pentax Ercp Scope [id number] was passed from the mouth into the esophagus and further advanced from the esophagus into the stomach. From stomach scope was directed to the first portion of the duodenum.  Major papilla was aligned with the duodenoscope. The scope position was confirmed fluoroscopically. Rest of the findings/therapeutics are given below. The scope was then completely withdrawn from the patient and the procedure completed. The pulse, BP, and O2  saturation were monitored and documented by the physician and the nursing staff throughout the entire procedure. The patient was cared for as planned according to standard protocol. The patient was then discharged to recovery in stable condition and with appropriate post procedure care. Estimated blood loss is zero unless otherwise noted in this procedure report.  The ERCP scope encountered a malignant duodenal obstruction (Image 003).  I was not able to pass the scope through the obstruction. The ERCP procedure was abandoned and an EGD was performed to assess the area better.  The distal duodenal bulb was ulcerated and friable.  Cold biopsies were obtained.  Even with the smaller EGD scope the endoscope was not able to pass through the obstruction. After the EGD and EUS with FNA was pursued.  Under ultrasound evaluation the celiac axis was normal.  No evidence of any lymph nodes.  The left adrenal gland was also normal (Image 019).  In the left lobe of the liver multiple hypoechoic lesions were identified and a large 1.5 x 2.0 cm lesion was found (Image 008).  The pancreatic body and tail of the pancreas were atrophic.  In the head of the pancreas a large 3.0 x 4.0 cm irregularly bordered hypoechoic mass was identified (Image 011).  No evidence of any cystic lesions.  The CBD was occluded and it measured 1.6 cm (Image 010).  The mass did not invade into the portal vein, but it encircled the vessel (Image 012).  Five passes with the FNA needle were performed (3 passes with the  25 gauge and then 2 passes with the 22 gauge) (Image 014).  Excellent samples were obtained.    ADVERSE EVENT:     There were no complications. IMPRESSIONS:     1) Large pancreatic head mass. 2) Liver mets. 3) Malignant duodenal obstruction. 4) Dilated CBD. 5) Malignant compression of the PV.  RECOMMENDATIONS:     1) Await biopsy results. 2) Duodenal stent placement 11/22/2015. 3) Follow up for obstructive  symptoms, i.e., jaundice, and then repeat ultrasound to evaluate for biliary ductal dilation.  I spoke with IR and a percutaneous approach cannot be pursued until the ducts further dilated. REPEAT EXAM:   ___________________________________ Carol Ada, MD eSigned:  Carol Ada, MD 2015-12-06 3:56 PM   cc:  CPT CODES: ICD9 CODES:  The ICD and CPT codes recommended by this software are interpretations from the data that the clinical staff has captured with the software.  The verification of the translation of this report to the ICD and CPT codes and modifiers is the sole responsibility of the health care institution and practicing physician where this report was generated.  [facility]. will not be held responsible for the validity of the ICD and CPT codes included on this report.  AMA assumes no liability for data contained or not contained herein. CPT is a Designer, television/film set of the [employer].   PATIENT NAME:  Jacob Hardin, Jacob Hardin MR#: [id number]

## 2015-11-18 NOTE — Transfer of Care (Signed)
Immediate Anesthesia Transfer of Care Note  Patient: Jacob Hardin  Procedure(s) Performed: Procedure(s): UPPER ENDOSCOPIC ULTRASOUND (EUS) LINEAR  Patient Location: PACU  Anesthesia Type:General  Level of Consciousness: awake  Airway & Oxygen Therapy: Patient Spontanous Breathing and Patient connected to nasal cannula oxygen  Post-op Assessment: Report given to RN and Post -op Vital signs reviewed and stable  Post vital signs: stable  Last Vitals:  Filed Vitals:   11/18/15 1038 11/18/15 1257  BP: 148/73 149/71  Pulse: 55 75  Resp: 13 14    Complications: No apparent anesthesia complications

## 2015-11-18 NOTE — Discharge Instructions (Signed)
General Anesthesia, Adult °General anesthesia is a sleep-like state of non-feeling produced by medicines (anesthetics). General anesthesia prevents you from being alert and feeling pain during a medical procedure. Your caregiver may recommend general anesthesia if your procedure: °· Is long. °· Is painful or uncomfortable. °· Would be frightening to see or hear. °· Requires you to be still. °· Affects your breathing. °· Causes significant blood loss. °LET YOUR CAREGIVER KNOW ABOUT: °· Allergies to food or medicine. °· Medicines taken, including vitamins, herbs, eyedrops, over-the-counter medicines, and creams. °· Use of steroids (by mouth or creams). °· Previous problems with anesthetics or numbing medicines, including problems experienced by relatives. °· History of bleeding problems or blood clots. °· Previous surgeries and types of anesthetics received. °· Possibility of pregnancy, if this applies. °· Use of cigarettes, alcohol, or illegal drugs. °· Any health condition(s), especially diabetes, sleep apnea, and high blood pressure. °RISKS AND COMPLICATIONS °General anesthesia rarely causes complications. However, if complications do occur, they can be life threatening. Complications include: °· A lung infection. °· A stroke. °· A heart attack. °· Waking up during the procedure. When this occurs, the patient may be unable to move and communicate that he or she is awake. The patient may feel severe pain. °Older adults and adults with serious medical problems are more likely to have complications than adults who are young and healthy. Some complications can be prevented by answering all of your caregiver's questions thoroughly and by following all pre-procedure instructions. It is important to tell your caregiver if any of the pre-procedure instructions, especially those related to diet, were not followed. Any food or liquid in the stomach can cause problems when you are under general anesthesia. °BEFORE THE  PROCEDURE °· Ask your caregiver if you will have to spend the night at the hospital. If you will not have to spend the night, arrange to have an adult drive you and stay with you for 24 hours. °· Follow your caregiver's instructions if you are taking dietary supplements or medicines. Your caregiver may tell you to stop taking them or to reduce your dosage. °· Do not smoke for as long as possible before your procedure. If possible, stop smoking 3-6 weeks before the procedure. °· Do not take new dietary supplements or medicines within 1 week of your procedure unless your caregiver approves them. °· Do not eat within 8 hours of your procedure or as directed by your caregiver. Drink only clear liquids, such as water, black coffee (without milk or cream), and fruit juices (without pulp). °· Do not drink within 3 hours of your procedure or as directed by your caregiver. °· You may brush your teeth on the morning of the procedure, but make sure to spit out the toothpaste and water when finished. °PROCEDURE  °You will receive anesthetics through a mask, through an intravenous (IV) access tube, or through both. A doctor who specializes in anesthesia (anesthesiologist) or a nurse who specializes in anesthesia (nurse anesthetist) or both will stay with you throughout the procedure to make sure you remain unconscious. He or she will also watch your blood pressure, pulse, and oxygen levels to make sure that the anesthetics do not cause any problems. Once you are asleep, a breathing tube or mask may be used to help you breathe. °AFTER THE PROCEDURE °You will wake up after the procedure is complete. You may be in the room where the procedure was performed or in a recovery area. You may have a sore throat   if a breathing tube was used. You may also feel:  Dizzy.  Weak.  Drowsy.  Confused.  Nauseous.  Cold. These are all normal responses and can be expected to last for up to 24 hours after the procedure is complete. A  caregiver will tell you when you are ready to go home. This will usually be when you are fully awake and in stable condition.   This information is not intended to replace advice given to you by your health care provider. Make sure you discuss any questions you have with your health care provider.   Document Released: 01/15/2008 Document Revised: 10/29/2014 Document Reviewed: 02/06/2012 Elsevier Interactive Patient Education 2016 [place]. Esophagogastroduodenoscopy, Care After Refer to this sheet in the next few weeks. These instructions provide you with information about caring for yourself after your procedure. Your health care provider may also give you more specific instructions. Your treatment has been planned according to current medical practices, but problems sometimes occur. Call your health care provider if you have any problems or questions after your procedure. WHAT TO EXPECT AFTER THE PROCEDURE After your procedure, it is typical to feel:  Soreness in your throat.  Pain with swallowing.  Sick to your stomach (nauseous).  Bloated.  Dizzy.  Fatigued. HOME CARE INSTRUCTIONS  Do not eat or drink anything until the numbing medicine (local anesthetic) has worn off and your gag reflex has returned. You will know that the local anesthetic has worn off when you can swallow comfortably.  Do not drive or operate machinery until directed by your health care provider.  Take medicines only as directed by your health care provider. SEEK MEDICAL CARE IF:   You cannot stop coughing.  You are not urinating at all or less than usual. SEEK IMMEDIATE MEDICAL CARE IF:  You have difficulty swallowing.  You cannot eat or drink.  You have worsening throat or chest pain.  You have dizziness or lightheadedness or you faint.  You have nausea or vomiting.  You have chills.  You have a fever.  You have severe abdominal pain.  You have black, tarry, or bloody stools.   This  information is not intended to replace advice given to you by your health care provider. Make sure you discuss any questions you have with your health care provider.   Document Released: 09/24/2012 Document Revised: 10/29/2014 Document Reviewed: 09/24/2012 Elsevier Interactive Patient Education [employer].

## 2015-11-19 ENCOUNTER — Encounter (HOSPITAL_COMMUNITY): Payer: Self-pay | Admitting: Anesthesiology

## 2015-11-19 NOTE — Anesthesia Preprocedure Evaluation (Deleted)
Anesthesia Evaluation  Patient identified by MRN, date of birth, ID band Patient awake    Reviewed: Allergy & Precautions, NPO status , Patient's Chart, lab work & pertinent test results  History of Anesthesia Complications (+) PONV and history of anesthetic complications  Airway Mallampati: II  TM Distance: >3 FB Neck ROM: Full    Dental no notable dental hx.    Pulmonary neg pulmonary ROS,    Pulmonary exam normal breath sounds clear to auscultation       Cardiovascular hypertension, Pt. on medications Normal cardiovascular exam Rhythm:Regular Rate:Normal     Neuro/Psych negative neurological ROS  negative psych ROS   GI/Hepatic negative GI ROS, Neg liver ROS,   Endo/Other  diabetes  Renal/GU negative Renal ROS     Musculoskeletal negative musculoskeletal ROS (+)   Abdominal   Peds  Hematology negative hematology ROS (+)   Anesthesia Other Findings Pancreatic mass  Reproductive/Obstetrics                             Anesthesia Physical  Anesthesia Plan  ASA: II  Anesthesia Plan: MAC   Post-op Pain Management:    Induction: Intravenous  Airway Management Planned: Nasal Cannula  Additional Equipment:   Intra-op Plan:   Post-operative Plan:   Informed Consent: I have reviewed the patients History and Physical, chart, labs and discussed the procedure including the risks, benefits and alternatives for the proposed anesthesia with the patient or authorized representative who has indicated his/her understanding and acceptance.   Dental advisory given  Plan Discussed with: CRNA  Anesthesia Plan Comments:         Anesthesia Quick Evaluation

## 2015-11-21 ENCOUNTER — Encounter (HOSPITAL_COMMUNITY): Payer: Self-pay | Admitting: Gastroenterology

## 2015-11-21 NOTE — Progress Notes (Signed)
Pt SDW-pre-op call completed by both patient and pt spouse with consent. Pt denies SOB, chest pain, and being under the care of a cardiologist. Pt denies having an echo and cardiac cath but stated that a stress test was done >10 years ago. Pt spouse made aware that pt should take 20 units of Levemir Insulin the morning of procedure after checking BS. Spouse made aware of diabetes protocol for BS < 70 > 220, including interventions and checking BS every 2 hours prior to arrival. Pt spouse made aware for pt to stoptaking Aspirin, otc vitamins, fish oil and herbal medications. Do not take any NSAIDs ie: Ibuprofen, Advil, Naproxen or any medication containing Aspirin. Pt spouse verbalized understanding of all pre-op instructions.

## 2015-11-22 ENCOUNTER — Ambulatory Visit (HOSPITAL_COMMUNITY): Payer: Medicare Other | Admitting: Anesthesiology

## 2015-11-22 ENCOUNTER — Encounter (HOSPITAL_COMMUNITY)
Admission: RE | Disposition: A | Discharge status: Home / Self Care | Payer: Self-pay | Source: Ambulatory Visit | Attending: Gastroenterology

## 2015-11-22 ENCOUNTER — Ambulatory Visit (HOSPITAL_COMMUNITY)
Admission: RE | Admit: 2015-11-22 | Discharge: 2015-11-22 | Disposition: A | Discharge status: Home / Self Care | Payer: Medicare Other | Source: Ambulatory Visit | Attending: Gastroenterology | Admitting: Gastroenterology

## 2015-11-22 ENCOUNTER — Ambulatory Visit (HOSPITAL_COMMUNITY): Payer: Medicare Other

## 2015-11-22 ENCOUNTER — Encounter (HOSPITAL_COMMUNITY): Payer: Self-pay | Admitting: *Deleted

## 2015-11-22 DIAGNOSIS — C784 Secondary malignant neoplasm of small intestine: Secondary | ICD-10-CM | POA: Diagnosis not present

## 2015-11-22 DIAGNOSIS — E119 Type 2 diabetes mellitus without complications: Secondary | ICD-10-CM | POA: Diagnosis not present

## 2015-11-22 DIAGNOSIS — I10 Essential (primary) hypertension: Secondary | ICD-10-CM | POA: Diagnosis not present

## 2015-11-22 DIAGNOSIS — K315 Obstruction of duodenum: Secondary | ICD-10-CM | POA: Diagnosis present

## 2015-11-22 DIAGNOSIS — K831 Obstruction of bile duct: Secondary | ICD-10-CM | POA: Diagnosis not present

## 2015-11-22 DIAGNOSIS — C787 Secondary malignant neoplasm of liver and intrahepatic bile duct: Secondary | ICD-10-CM | POA: Insufficient documentation

## 2015-11-22 DIAGNOSIS — Z794 Long term (current) use of insulin: Secondary | ICD-10-CM | POA: Insufficient documentation

## 2015-11-22 DIAGNOSIS — E785 Hyperlipidemia, unspecified: Secondary | ICD-10-CM | POA: Insufficient documentation

## 2015-11-22 DIAGNOSIS — C250 Malignant neoplasm of head of pancreas: Secondary | ICD-10-CM | POA: Insufficient documentation

## 2015-11-22 DIAGNOSIS — Z800 Family history of malignant neoplasm of digestive organs: Secondary | ICD-10-CM | POA: Diagnosis not present

## 2015-11-22 HISTORY — PX: ESOPHAGOGASTRODUODENOSCOPY: SHX5428

## 2015-11-22 HISTORY — PX: DUODENAL STENT PLACEMENT: SHX5541

## 2015-11-22 HISTORY — DX: Malignant neoplasm of pancreas, unspecified: C25.9

## 2015-11-22 LAB — HEPATIC FUNCTION PANEL
ALT: 122 U/L — AB (ref 17–63)
AST: 97 U/L — AB (ref 15–41)
Albumin: 3.4 g/dL — ABNORMAL LOW (ref 3.5–5.0)
Alkaline Phosphatase: 526 U/L — ABNORMAL HIGH (ref 38–126)
BILIRUBIN DIRECT: 3.4 mg/dL — AB (ref 0.1–0.5)
BILIRUBIN TOTAL: 5.6 mg/dL — AB (ref 0.3–1.2)
Indirect Bilirubin: 2.2 mg/dL — ABNORMAL HIGH (ref 0.3–0.9)
Total Protein: 6.2 g/dL — ABNORMAL LOW (ref 6.5–8.1)

## 2015-11-22 LAB — GLUCOSE, CAPILLARY: Glucose-Capillary: 99 mg/dL (ref 65–99)

## 2015-11-22 SURGERY — EGD (ESOPHAGOGASTRODUODENOSCOPY)
Anesthesia: Monitor Anesthesia Care

## 2015-11-22 MED ORDER — SODIUM CHLORIDE 0.9 % IV SOLN: INTRAVENOUS | Status: DC

## 2015-11-22 MED ORDER — LACTATED RINGERS IV SOLN
INTRAVENOUS | Status: DC
  Administered 2015-11-22: 12:00:00 via INTRAVENOUS

## 2015-11-22 MED ORDER — IOHEXOL 300 MG/ML  SOLN
INTRAMUSCULAR | Status: DC | PRN
  Administered 2015-11-22: 25 mL via INTRAVENOUS

## 2015-11-22 MED ORDER — LIDOCAINE HCL (CARDIAC) 20 MG/ML IV SOLN
INTRAVENOUS | Status: DC | PRN
  Administered 2015-11-22: 100 mg via INTRAVENOUS

## 2015-11-22 MED ORDER — PROPOFOL 500 MG/50ML IV EMUL
INTRAVENOUS | Status: DC | PRN
  Administered 2015-11-22: 100 ug/kg/min via INTRAVENOUS

## 2015-11-22 MED ORDER — PROPOFOL 10 MG/ML IV BOLUS
INTRAVENOUS | Status: DC | PRN
  Administered 2015-11-22: 50 mg via INTRAVENOUS
  Administered 2015-11-22: 30 mg via INTRAVENOUS

## 2015-11-22 NOTE — Discharge Instructions (Signed)
Esophagogastroduodenoscopy Esophagogastroduodenoscopy (EGD) is a procedure that is used to examine the lining of the esophagus, stomach, and first part of the small intestine (duodenum). A long, flexible, lighted tube with a camera attached (endoscope) is inserted down the throat to view these organs. This procedure is done to detect problems or abnormalities, such as inflammation, bleeding, ulcers, or growths, in order to treat them. The procedure lasts 5-20 minutes. It is usually an outpatient procedure, but it may need to be performed in a hospital in emergency cases. LET [facility] CARE PROVIDER KNOW ABOUT:  Any allergies you have.  All medicines you are taking, including vitamins, herbs, eye drops, creams, and over-the-counter medicines.  Previous problems you or members of your family have had with the use of anesthetics.  Any blood disorders you have.  Previous surgeries you have had.  Medical conditions you have. RISKS AND COMPLICATIONS Generally, this is a safe procedure. However, problems can occur and include:  Infection.  Bleeding.  Tearing (perforation) of the esophagus, stomach, or duodenum.  Difficulty breathing or not being able to breathe.  Excessive sweating.  Spasms of the larynx.  Slowed heartbeat.  Low blood pressure. BEFORE THE PROCEDURE  Do not eat or drink anything after midnight on the night before the procedure or as directed by your health care provider.  Do not take your regular medicines before the procedure if your health care provider asks you not to. Ask your health care provider about changing or stopping those medicines.  If you wear dentures, be prepared to remove them before the procedure.  Arrange for someone to drive you home after the procedure. PROCEDURE  A numbing medicine (local anesthetic) may be sprayed in your throat for comfort and to stop you from gagging or coughing.  You will have an IV tube inserted in a vein in your  hand or arm. You will receive medicines and fluids through this tube.  You will be given a medicine to relax you (sedative).  A pain reliever will be given through the IV tube.  A mouth guard may be placed in your mouth to protect your teeth and to keep you from biting on the endoscope.  You will be asked to lie on your left side.  The endoscope will be inserted down your throat and into your esophagus, stomach, and duodenum.  Air will be put through the endoscope to allow your health care provider to clearly view the lining of your esophagus.  The lining of your esophagus, stomach, and duodenum will be examined. During the exam, your health care provider may:  Remove tissue to be examined under a microscope (biopsy) for inflammation, infection, or other medical problems.  Remove growths.  Remove objects (foreign bodies) that are stuck.  Treat any bleeding with medicines or other devices that stop tissues from bleeding (hot cautery, clipping devices).  Widen (dilate) or stretch narrowed areas of your esophagus and stomach.  The endoscope will be withdrawn. AFTER THE PROCEDURE  You will be taken to a recovery area for observation. Your blood pressure, heart rate, breathing rate, and blood oxygen level will be monitored often until the medicines you were given have worn off.  Do not eat or drink anything until the numbing medicine has worn off and your gag reflex has returned. You may choke.  Your health care provider should be able to discuss his or her findings with you. It will take longer to discuss the test results if any biopsies were taken.  This information is not intended to replace advice given to you by your health care provider. Make sure you discuss any questions you have with your health care provider. °  °Document Released: 02/08/2005 Document Revised: 10/29/2014 Document Reviewed: 09/10/2012 °Elsevier Interactive Patient Education ©2016 [employer]vier Inc. ° °

## 2015-11-22 NOTE — H&P (View-Only) (Signed)
  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  He was noted, incidentally, to have an elevated liver panel.  Initially it was thought that it was secondary to statins, but further evaluation with an ultrasound revealed biliary ductal dilation, a possible mass in the head of the pancreas, and probable metastatic lesions in the liver.  A follow up MRCP was obtained, which confirmed and elucidated the findings of the ultrasound.  He is here today to undergo an ERCP with stent placement to decompress his biliary tract.  No evidence of an ascending cholangitis.  His brother died of pancreatic cancer at the age of 51/58.  Past Medical History  Diagnosis Date  . Diabetes mellitus without complication ([place])   . Hyperlipidemia   . Complication of anesthesia   . PONV (postoperative nausea and vomiting)   . Hypertension     recently weight lost-no meds now-running low.    Past Surgical History  Procedure Laterality Date  . Spine surgery  1994    cervical-bone graft  . Rotator cuff repair Left 2014  . Tonsillectomy    . Hernia repair    . Colonoscopy    . Wisdom tooth extraction      Family History  Problem Relation Age of Onset  . Diabetes Mother   . Hypertension Mother   . Cancer Father     lung 38  . Hypertension Father   . Cancer Brother     pancreatic   . Diabetes Brother   . Cancer Paternal Uncle     prostate  . Cancer Paternal Grandfather     colon    Social History:  reports that he has never smoked. He has never used smokeless tobacco. He reports that he does not drink alcohol or use illicit drugs.  Allergies:  Allergies  Allergen Reactions  . Codeine Nausea Only    Medications:  Scheduled:  Continuous: . sodium chloride    . lactated ringers      No results found for this or any previous visit (from the past 24 hour(s)).   No results found.  ROS:  As stated above in the HPI otherwise negative.  Blood  pressure 148/73, pulse 55, resp. rate 13, SpO2 98 %.    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  Plan: 1) ERCP with stent placement.  Remiel Corti D 11/18/2015, 10:46 AM

## 2015-11-22 NOTE — Transfer of Care (Signed)
Immediate Anesthesia Transfer of Care Note  Patient: Jacob Hardin  Procedure(s) Performed: Procedure(s) with comments: ESOPHAGOGASTRODUODENOSCOPY (EGD) (N/A) - Uncovered duodenal stent placement.  Fluoroscopy required. DUODENAL STENT PLACEMENT (N/A)  Patient Location: PACU and Endoscopy Unit  Anesthesia Type:MAC  Level of Consciousness: awake, alert , oriented and patient cooperative  Airway & Oxygen Therapy: Patient Spontanous Breathing and Patient connected to nasal cannula oxygen  Post-op Assessment: Report given to RN, Post -op Vital signs reviewed and stable and Patient moving all extremities X 4  Post vital signs: Reviewed and stable  Last Vitals:  Filed Vitals:   11/22/15 1135 11/22/15 1422  BP: 147/67 113/67  Pulse: 55 62  Temp: 36.8 C   Resp:  11    Complications: No apparent anesthesia complications

## 2015-11-22 NOTE — Interval H&P Note (Signed)
History and Physical Interval Note:  11/22/2015 1:29 PM  Jacob Hardin  has presented today for surgery, with the diagnosis of Malignant duodenal obstruction.  The various methods of treatment have been discussed with the patient and family. After consideration of risks, benefits and other options for treatment, the patient has consented to  Procedure(s) with comments: ESOPHAGOGASTRODUODENOSCOPY (EGD) (N/A) - Uncovered duodenal stent placement.  Fluoroscopy required. DUODENAL STENT PLACEMENT (N/A) as a surgical intervention .  The patient's history has been reviewed, patient examined, no change in status, stable for surgery.  I have reviewed the patient's chart and labs.  Questions were answered to the patient's satisfaction.     Adysson Revelle D

## 2015-11-22 NOTE — Anesthesia Preprocedure Evaluation (Addendum)
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    Pulmonary neg pulmonary ROS,    Pulmonary exam normal breath sounds clear to auscultation       Cardiovascular hypertension, Pt. on medications Normal cardiovascular exam Rhythm:Regular Rate:Normal     Neuro/Psych negative neurological ROS  negative psych ROS   GI/Hepatic negative GI ROS, Neg liver ROS,   Endo/Other  diabetes, Insulin Dependent, Oral Hypoglycemic Agents  Renal/GU negative Renal ROS  negative genitourinary   Musculoskeletal negative musculoskeletal ROS (+)   Abdominal   Peds negative pediatric ROS (+)  Hematology negative hematology ROS (+)   Anesthesia Other Findings   Reproductive/Obstetrics negative OB ROS                            Anesthesia Physical Anesthesia Plan  ASA: III  Anesthesia Plan: MAC   Post-op Pain Management:    Induction: Intravenous  Airway Management Planned: Nasal Cannula  Additional Equipment:   Intra-op Plan:   Post-operative Plan:   Informed Consent: I have reviewed the patients History and Physical, chart, labs and discussed the procedure including the risks, benefits and alternatives for the proposed anesthesia with the patient or authorized representative who has indicated his/her understanding and acceptance.   Dental advisory given  Plan Discussed with: CRNA, Surgeon and Anesthesiologist  Anesthesia Plan Comments:        Anesthesia Quick Evaluation

## 2015-11-22 NOTE — Op Note (Signed)
[facility] [place], [address]   ENDOSCOPY PROCEDURE REPORT  PATIENT: Jacob Hardin, Jacob Hardin  MR#: [id number] BIRTHDATE: May 05, 1946 , 30  yrs. old GENDER: male ENDOSCOPIST:Purvi Ruehl [name], MD REFERRED BY: PROCEDURE DATE:  12-08-2015 PROCEDURE:   EGD with stent placement ASA CLASS:    Class II INDICATIONS: Malignant duodenla obstruction MEDICATION: Monitored anesthesia care TOPICAL ANESTHETIC:   none  DESCRIPTION OF PROCEDURE:   After the risks and benefits of the procedure were explained, informed consent was obtained.  The Pentax [id number] 3.8 [id number]  endoscope was introduced through the mouth  and advanced to the second portion of the duodenum .  The instrument was slowly withdrawn as the mucosa was fully examined. Estimated blood loss is zero unless otherwise noted in this procedure report.   FINDINGS: The esophagus was normal as well  as the gastric lumen. In the duodenal bulb friable mucosa was identified.  Using a guidewire with fluoroscopy, the wire was advanced to the proximal jejunum.  A balloon cathether was then advanced over the guidewire beyond the mass.  The balloon was inflated and withdrawn until resistance was met, which was the distal edge of the malignant stricture.  The balloon was deflated and withdrawn while injecting contrast.  The length of the obstruction was approximatly 4 cm.  A 6 cm uncovered duodenal stent was successfuly deployed fluoroscopically and also with endoscopic visualization.  Excellent placement was achieved as confirmed endoscopically and with fluoroscopy.    Retroflexed views revealed no abnormalities.    The scope was then withdrawn from the patient and the procedure completed.  COMPLICATIONS: There were no immediate complications.  ENDOSCOPIC IMPRESSION: 1) Malignant duodenal obtruction with successful stent placement.  RECOMMENDATIONS: 1) Check liver panel. 2) Follow up with Oncology on  Thursday. _______________________________ eSigned:  Carol Ada, MD 2015/12/08 2:36 PM   cc: CPT CODES: ICD CODES:

## 2015-11-22 NOTE — Anesthesia Postprocedure Evaluation (Signed)
Anesthesia Post Note  Patient: CHOYA SALEHI  Procedure(s) Performed: Procedure(s) (LRB): ESOPHAGOGASTRODUODENOSCOPY (EGD) (N/A) DUODENAL STENT PLACEMENT (N/A)  Patient location during evaluation: PACU Anesthesia Type: MAC Level of consciousness: awake and alert Pain management: pain level controlled Vital Signs Assessment: post-procedure vital signs reviewed and stable Respiratory status: spontaneous breathing, nonlabored ventilation, respiratory function stable and patient connected to nasal cannula oxygen Cardiovascular status: blood pressure returned to baseline and stable Postop Assessment: no signs of nausea or vomiting Anesthetic complications: no    Last Vitals:  Filed Vitals:   11/22/15 1422 11/22/15 1425  BP: 113/67 113/67  Pulse: 62 59  Temp:    Resp: 11 14    Last Pain: There were no vitals filed for this visit.               Earnestene Angello S

## 2015-11-23 ENCOUNTER — Encounter (HOSPITAL_COMMUNITY): Payer: Self-pay | Admitting: Gastroenterology

## 2015-11-24 ENCOUNTER — Encounter: Payer: Self-pay | Admitting: *Deleted

## 2015-11-24 ENCOUNTER — Telehealth: Payer: Self-pay | Admitting: Oncology

## 2015-11-24 ENCOUNTER — Encounter: Payer: Self-pay | Admitting: Oncology

## 2015-11-24 ENCOUNTER — Ambulatory Visit (HOSPITAL_BASED_OUTPATIENT_CLINIC_OR_DEPARTMENT_OTHER): Payer: Medicare Other | Admitting: Oncology

## 2015-11-24 ENCOUNTER — Other Ambulatory Visit: Payer: Self-pay | Admitting: Radiology

## 2015-11-24 VITALS — BP 145/66 | HR 58 | Temp 97.9°F | Resp 17 | Wt 170.5 lb

## 2015-11-24 DIAGNOSIS — E119 Type 2 diabetes mellitus without complications: Secondary | ICD-10-CM

## 2015-11-24 DIAGNOSIS — C250 Malignant neoplasm of head of pancreas: Secondary | ICD-10-CM | POA: Diagnosis not present

## 2015-11-24 DIAGNOSIS — C787 Secondary malignant neoplasm of liver and intrahepatic bile duct: Secondary | ICD-10-CM | POA: Diagnosis not present

## 2015-11-24 NOTE — Progress Notes (Signed)
[facility] Patient Consult   Referring MD: [name], Md [address] [place], [place] [address]   Jacob Hardin 70 y.o.  1945-12-15    Reason for Referral: Pancreas cancer   HPI: Jacob Hardin developed anorexia and weight loss beginning in December 2016. He saw Dr. Dennard Schaumann and the liver enzymes were elevated. He was referred for an abdominal ultrasound 11/11/2015. The common bile duct was dilated and there was a questionable mass in the pancreatic head. Multiple hypoechoic lesions were seen in the liver. An MRI of the abdomen 11/15/2015 revealed innumerable lesions in the liver consistent with metastases. There is moderate intrahepatic bile duct dilatation with the common bile duct measuring up to 1.5 cm. A mass is seen in the head of the pancreas with obstruction of the common bile duct and pancreatic duct. The mass encases the portal vein with no involvement of the celiac trunk or superior mesenteric artery. No free fluid.  He was referred to Dr. [name] and was taken to an ERCP procedure 11/18/2015. A malignant obstruction was encountered in the duodenum. The scope could not be passed through the obstruction. The distal duodenal bulb was ulcerated and friable. Biopsies were obtained. An EUS was performed multiple hypoechoic lesions were seen in the liver. A mass was noted in the head of the pancreas. The common bile duct was occluded. The mass did not invade the portal vein, but encircled the vessel. FNA biopsies of the mass were performed. The cytology confirmed malignant cells consistent with adenocarcinoma. The biopsy from the duodenal mass also revealed adenocarcinoma. He was taken to an EGD with stent placement 11/22/2015. A duodenal stent was placed over the malignant obstruction.    Past Medical History  Diagnosis Date  . Diabetes mellitus without complication ([place])   . Hyperlipidemia   . Complication of anesthesia   . PONV (postoperative  nausea and vomiting)   . Hypertension     recently weight lost-no meds now-running low.  . Primary pancreatic adenocarcinoma [facility])  January 2017     mets to liver and duodenum    Past Surgical History  Procedure Laterality Date  . Spine surgery  1994    cervical-bone graft  . Rotator cuff repair Left 2014  . Tonsillectomy    . Hernia repair    . Colonoscopy    . Wisdom tooth extraction    . Eus  11/18/2015    Procedure: UPPER ENDOSCOPIC ULTRASOUND (EUS) LINEAR;  Surgeon: Carol Ada, MD;  Location: [facility] ENDOSCOPY;  Service: Endoscopy;;  . Esophagogastroduodenoscopy N/A 11/18/2015    Procedure: ESOPHAGOGASTRODUODENOSCOPY (EGD);  Surgeon: Carol Ada, MD;  Location: [facility] ENDOSCOPY;  Service: Endoscopy;  Laterality: N/A;  . Esophagogastroduodenoscopy N/A 11/22/2015    Procedure: ESOPHAGOGASTRODUODENOSCOPY (EGD);  Surgeon: Carol Ada, MD;  Location: [facility] ENDOSCOPY;  Service: Endoscopy;  Laterality: N/A;  Uncovered duodenal stent placement.  Fluoroscopy required.  . Duodenal stent placement N/A 11/22/2015    Procedure: DUODENAL STENT PLACEMENT;  Surgeon: Carol Ada, MD;  Location: [facility] ENDOSCOPY;  Service: Endoscopy;  Laterality: N/A;    Medications: Reviewed  Allergies:  Allergies  Allergen Reactions  . Codeine Nausea Only    Family history: His father died of lung cancer and was a smoker. A paternal uncle had prostate cancer. His paternal grandfather died of colon cancer. His brother died of pancreas cancer at age 34. He has no children. No other family history of cancer.  Social History:   He is a  retired Firefighter. He lives in [place]. He does not use tobacco or alcohol. No transfusion history. No risk factor for HIV or hepatitis.     ROS:   Positives include: Anorexia, 10 pound weight loss, postprandial "tightness "in the upper abdomen, dark urine  A complete ROS was otherwise negative.  Physical Exam:  Blood pressure 145/66, pulse 58, temperature 97.9  F (36.6 C), temperature source Oral, resp. rate 17, weight 170 lb 8 oz (77.338 kg), SpO2 100 %.  HEENT: Oropharynx without visible mass, neck without mass, scleral icterus Lungs: Clear bilaterally Cardiac: Regular rate and rhythm Abdomen: No hepatosplenomegaly, no apparent ascites, no mass, mild tenderness in the right subcostal region GU: Testes without mass, nodularity at the epididymis bilaterally  Vascular: No leg edema Lymph nodes: No cervical, supraclavicular, axillary, or inguinal nodes Neurologic: Alert and oriented, the motor exam appears intact in the upper and lower extremities Skin: Jaundice Musculoskeletal: No spine tenderness   LAB:  CBC  Lab Results  Component Value Date   WBC 8.9 11/10/2015   HGB 11.3* 11/10/2015   HCT 34.2* 11/10/2015   MCV 87.5 11/10/2015   PLT 259 11/10/2015   NEUTROABS 6.1 11/10/2015     CMP      Component Value Date/Time   NA 139 11/03/2015 0839   K 4.3 11/03/2015 0839   CL 100 11/03/2015 0839   CO2 28 11/03/2015 0839   GLUCOSE 101* 11/03/2015 0839   BUN 20 11/03/2015 0839   CREATININE 0.98 11/03/2015 0839   CALCIUM 9.5 11/03/2015 0839   PROT 6.2* 11/22/2015 1448   ALBUMIN 3.4* 11/22/2015 1448   AST 97* 11/22/2015 1448   ALT 122* 11/22/2015 1448   ALKPHOS 526* 11/22/2015 1448   BILITOT 5.6* 11/22/2015 1448   GFRNONAA 79 11/03/2015 0839   GFRAA >89 11/03/2015 0839      Imaging: MRI 11/15/2015-reviewed   Assessment/Plan:   1. 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   Diabetes  3.   Anorexia/weight loss secondary to #1  4.   Obstructive jaundice   Disposition:   Jacob Hardin has been diagnosed with metastatic  pancreas cancer. The common bile duct is obstructed by tumor. An ERCP could not be performed secondary to tumor obstructing the duodenum. I reviewed the MRI images with interventional radiology. He will be referred for placement of an external/internal biliary drain within the next few days.  Jacob Hardin will return for an office visit 11/29/2015. We had a preliminary discussion regarding systemic treatment options. He will consider treatment with FOLFIRINOX, gemcitabine/Abraxane, or enrollment on the SWOG S1313 study.  We referred him for placement of a Port-A-Cath.  Approximately 50 minutes were spent with the patient today. The majority of the time was used for counseling and coordination of care.  [place], [place] 11/24/2015, 5:11 PM

## 2015-11-24 NOTE — Progress Notes (Signed)
Oncology Nurse Navigator Documentation  Oncology Nurse Navigator Flowsheets 11/24/2015  Navigator Location [facility]Med Onc  Navigator Encounter Type Initial MedOnc  Abnormal Finding Date -  Confirmed Diagnosis Date 11/18/2015  Patient Visit Type MedOnc;Initial  Treatment Phase Pre-Tx/Tx Discussion  Barriers/Navigation Needs Family concerns;Education  Education Accessing Care/ Finding Providers;Understanding Cancer/ Treatment Options;Newly Diagnosed Cancer Education;Preparing for Upcoming  Treatment  Interventions Education Method  Education Method Verbal;Written;Teach-back  Support Groups/Services GI Support Group--declines need for ACS Personal Health Manager;referral to nutrition  Acuity Level 2  Time Spent with Patient 23  Met with patient and wife, Butch Penny during new patient visit. Explained the role of the GI Nurse Navigator and provided New Patient Packet with information on: 1. Pancreas cancer 2. Support groups 3. Advanced Directives 4. Fall Safety Plan Answered questions, reviewed current treatment plan using TEACH back and provided emotional support. Provided copy of current treatment plan. Scheduled him to see Dr. [name] on 11/29/15 at 12:00. Noted for wife the names of the drugs on FOLFIRINOX regimen and Gemzar/Abraxane. Will discuss in more detail at next visit. Priority at this time is to get his bilary drain open with stent and temporary external drainage bag to get his bilirubin down so he can be treated. He is jaundiced today upon exam. Has been given samples of Creon from GI. Will provide Creon co pay card at next visit to use when script is written for him.  Merceda Elks, RN, BSN GI Oncology [place]

## 2015-11-24 NOTE — Telephone Encounter (Signed)
per pof to sch pt appt-cld pt and left message of time & date of 2/14 appt

## 2015-11-25 ENCOUNTER — Encounter (HOSPITAL_COMMUNITY): Payer: Self-pay

## 2015-11-25 ENCOUNTER — Ambulatory Visit (HOSPITAL_COMMUNITY)
Admission: RE | Admit: 2015-11-25 | Discharge: 2015-11-25 | Disposition: A | Discharge status: Home / Self Care | Payer: Medicare Other | Source: Ambulatory Visit | Attending: Gastroenterology | Admitting: Gastroenterology

## 2015-11-25 ENCOUNTER — Encounter (HOSPITAL_COMMUNITY)
Admission: RE | Disposition: A | Discharge status: Home / Self Care | Payer: Self-pay | Source: Ambulatory Visit | Attending: Gastroenterology

## 2015-11-25 ENCOUNTER — Other Ambulatory Visit: Payer: Self-pay | Admitting: Oncology

## 2015-11-25 ENCOUNTER — Ambulatory Visit (HOSPITAL_COMMUNITY)
Admission: RE | Admit: 2015-11-25 | Discharge: 2015-11-25 | Disposition: A | Discharge status: Home / Self Care | Payer: Medicare Other | Source: Ambulatory Visit | Attending: Oncology | Admitting: Oncology

## 2015-11-25 DIAGNOSIS — C784 Secondary malignant neoplasm of small intestine: Secondary | ICD-10-CM | POA: Diagnosis not present

## 2015-11-25 DIAGNOSIS — E785 Hyperlipidemia, unspecified: Secondary | ICD-10-CM | POA: Diagnosis not present

## 2015-11-25 DIAGNOSIS — Z833 Family history of diabetes mellitus: Secondary | ICD-10-CM | POA: Diagnosis not present

## 2015-11-25 DIAGNOSIS — Z800 Family history of malignant neoplasm of digestive organs: Secondary | ICD-10-CM | POA: Insufficient documentation

## 2015-11-25 DIAGNOSIS — C259 Malignant neoplasm of pancreas, unspecified: Secondary | ICD-10-CM | POA: Insufficient documentation

## 2015-11-25 DIAGNOSIS — C787 Secondary malignant neoplasm of liver and intrahepatic bile duct: Secondary | ICD-10-CM | POA: Diagnosis not present

## 2015-11-25 DIAGNOSIS — Z79899 Other long term (current) drug therapy: Secondary | ICD-10-CM | POA: Diagnosis not present

## 2015-11-25 DIAGNOSIS — R748 Abnormal levels of other serum enzymes: Secondary | ICD-10-CM | POA: Insufficient documentation

## 2015-11-25 DIAGNOSIS — I10 Essential (primary) hypertension: Secondary | ICD-10-CM | POA: Diagnosis not present

## 2015-11-25 DIAGNOSIS — Z8042 Family history of malignant neoplasm of prostate: Secondary | ICD-10-CM | POA: Diagnosis not present

## 2015-11-25 DIAGNOSIS — K831 Obstruction of bile duct: Secondary | ICD-10-CM | POA: Insufficient documentation

## 2015-11-25 DIAGNOSIS — Z885 Allergy status to narcotic agent status: Secondary | ICD-10-CM | POA: Diagnosis not present

## 2015-11-25 DIAGNOSIS — C250 Malignant neoplasm of head of pancreas: Secondary | ICD-10-CM

## 2015-11-25 DIAGNOSIS — Z794 Long term (current) use of insulin: Secondary | ICD-10-CM | POA: Insufficient documentation

## 2015-11-25 DIAGNOSIS — Z8249 Family history of ischemic heart disease and other diseases of the circulatory system: Secondary | ICD-10-CM | POA: Insufficient documentation

## 2015-11-25 DIAGNOSIS — Z801 Family history of malignant neoplasm of trachea, bronchus and lung: Secondary | ICD-10-CM | POA: Insufficient documentation

## 2015-11-25 DIAGNOSIS — E119 Type 2 diabetes mellitus without complications: Secondary | ICD-10-CM | POA: Insufficient documentation

## 2015-11-25 HISTORY — DX: Other specified postprocedural states: R11.2

## 2015-11-25 HISTORY — DX: Other specified postprocedural states: Z98.890

## 2015-11-25 LAB — PROTIME-INR
INR: 1.07 (ref 0.00–1.49)
PROTHROMBIN TIME: 14.1 s (ref 11.6–15.2)

## 2015-11-25 LAB — COMPREHENSIVE METABOLIC PANEL
ALK PHOS: 544 U/L — AB (ref 38–126)
ALT: 123 U/L — AB (ref 17–63)
ANION GAP: 11 (ref 5–15)
AST: 101 U/L — ABNORMAL HIGH (ref 15–41)
Albumin: 3.6 g/dL (ref 3.5–5.0)
BILIRUBIN TOTAL: 6.8 mg/dL — AB (ref 0.3–1.2)
BUN: 15 mg/dL (ref 6–20)
CALCIUM: 9.5 mg/dL (ref 8.9–10.3)
CO2: 23 mmol/L (ref 22–32)
CREATININE: 0.74 mg/dL (ref 0.61–1.24)
Chloride: 105 mmol/L (ref 101–111)
Glucose, Bld: 167 mg/dL — ABNORMAL HIGH (ref 65–99)
Potassium: 3.8 mmol/L (ref 3.5–5.1)
Sodium: 139 mmol/L (ref 135–145)
TOTAL PROTEIN: 7 g/dL (ref 6.5–8.1)

## 2015-11-25 LAB — CBC WITH DIFFERENTIAL/PLATELET
Basophils Absolute: 0 10*3/uL (ref 0.0–0.1)
Basophils Relative: 0 %
EOS ABS: 0.3 10*3/uL (ref 0.0–0.7)
Eosinophils Relative: 3 %
HCT: 32.8 % — ABNORMAL LOW (ref 39.0–52.0)
HEMOGLOBIN: 11 g/dL — AB (ref 13.0–17.0)
LYMPHS ABS: 1.1 10*3/uL (ref 0.7–4.0)
LYMPHS PCT: 11 %
MCH: 28.9 pg (ref 26.0–34.0)
MCHC: 33.5 g/dL (ref 30.0–36.0)
MCV: 86.1 fL (ref 78.0–100.0)
MONOS PCT: 9 %
Monocytes Absolute: 0.8 10*3/uL (ref 0.1–1.0)
NEUTROS PCT: 77 %
Neutro Abs: 7.4 10*3/uL (ref 1.7–7.7)
Platelets: 235 10*3/uL (ref 150–400)
RBC: 3.81 MIL/uL — AB (ref 4.22–5.81)
RDW: 14.8 % (ref 11.5–15.5)
WBC: 9.7 10*3/uL (ref 4.0–10.5)

## 2015-11-25 LAB — APTT: aPTT: 27 seconds (ref 24–37)

## 2015-11-25 LAB — GLUCOSE, CAPILLARY: GLUCOSE-CAPILLARY: 129 mg/dL — AB (ref 65–99)

## 2015-11-25 SURGERY — UPPER ENDOSCOPIC ULTRASOUND (EUS) LINEAR
Anesthesia: Monitor Anesthesia Care

## 2015-11-25 MED ORDER — OXYCODONE HCL 5 MG PO TABS: 5.0000 mg | ORAL_TABLET | Freq: Four times a day (QID) | ORAL | Status: DC | PRN

## 2015-11-25 MED ORDER — MIDAZOLAM HCL 2 MG/2ML IJ SOLN
INTRAMUSCULAR | Status: AC | PRN
  Administered 2015-11-25 (×5): 1 mg via INTRAVENOUS

## 2015-11-25 MED ORDER — ONDANSETRON HCL 4 MG/2ML IJ SOLN
4.0000 mg | Freq: Once | INTRAMUSCULAR | Status: AC
  Administered 2015-11-25: 4 mg via INTRAVENOUS
  Filled 2015-11-25: qty 2

## 2015-11-25 MED ORDER — HYDROMORPHONE HCL 2 MG/ML IJ SOLN
INTRAMUSCULAR | Status: AC
  Filled 2015-11-25: qty 1

## 2015-11-25 MED ORDER — IOHEXOL 300 MG/ML  SOLN
50.0000 mL | Freq: Once | INTRAMUSCULAR | Status: AC | PRN
  Administered 2015-11-25: 8 mL via INTRAVENOUS

## 2015-11-25 MED ORDER — HYDROCODONE-ACETAMINOPHEN 5-325 MG PO TABS: 1.0000 | ORAL_TABLET | ORAL | Status: DC | PRN

## 2015-11-25 MED ORDER — ONDANSETRON HCL 4 MG/2ML IJ SOLN
INTRAMUSCULAR | Status: DC
Start: 2015-11-25 — End: 2015-11-25
  Filled 2015-11-25: qty 2

## 2015-11-25 MED ORDER — LIDOCAINE HCL 1 % IJ SOLN
INTRAMUSCULAR | Status: AC
  Filled 2015-11-25: qty 20

## 2015-11-25 MED ORDER — MIDAZOLAM HCL 2 MG/2ML IJ SOLN
INTRAMUSCULAR | Status: AC
  Filled 2015-11-25: qty 6

## 2015-11-25 MED ORDER — FENTANYL CITRATE (PF) 100 MCG/2ML IJ SOLN
INTRAMUSCULAR | Status: AC | PRN
  Administered 2015-11-25 (×2): 25 ug via INTRAVENOUS
  Administered 2015-11-25: 50 ug via INTRAVENOUS

## 2015-11-25 MED ORDER — SODIUM CHLORIDE 0.9 % IV SOLN
INTRAVENOUS | Status: DC
  Administered 2015-11-25: 08:00:00 via INTRAVENOUS

## 2015-11-25 MED ORDER — PIPERACILLIN-TAZOBACTAM 3.375 G IVPB
3.3750 g | Freq: Once | INTRAVENOUS | Status: AC
  Administered 2015-11-25: 3.375 g via INTRAVENOUS
  Filled 2015-11-25: qty 50

## 2015-11-25 MED ORDER — FENTANYL CITRATE (PF) 100 MCG/2ML IJ SOLN
INTRAMUSCULAR | Status: AC
  Filled 2015-11-25: qty 4

## 2015-11-25 NOTE — H&P (Signed)
Chief Complaint: Patient was seen in consultation today for percutaneous transhepatic cholangiogram with biliary drainage  Referring Physician(s): Sherrill,Gary B  History of Present Illness: Jacob Hardin is a 70 y.o. male with history of newly diagnosed metastatic pancreatic carcinoma and biliary obstruction with recent duodenal stent placement. Recent MRI abdomen has revealed extensive liver metastasis as well as obstruction of the common bile duct and pancreatic duct by the pancreatic mass with partial encasement of the portal vein at the level of the portal venous confluence. Request now received for PTC with biliary drain placement.  Past Medical History  Diagnosis Date  . Diabetes mellitus without complication ([place])   . Hyperlipidemia   . Complication of anesthesia   . PONV (postoperative nausea and vomiting)   . Hypertension     recently weight lost-no meds now-running low.  . Primary pancreatic adenocarcinoma ([place])     mets to liver and duodenum    Past Surgical History  Procedure Laterality Date  . Spine surgery  1994    cervical-bone graft  . Rotator cuff repair Left 2014  . Tonsillectomy    . Hernia repair    . Colonoscopy    . Wisdom tooth extraction    . Eus  11/18/2015    Procedure: UPPER ENDOSCOPIC ULTRASOUND (EUS) LINEAR;  Surgeon: Carol Ada, MD;  [facility] ENDOSCOPY;  Service: Endoscopy;;  . Esophagogastroduodenoscopy N/A 11/18/2015    Procedure: ESOPHAGOGASTRODUODENOSCOPY (EGD);  Surgeon: Carol Ada, MD;  Location: [facility] ENDOSCOPY;  Service: Endoscopy;  Laterality: N/A;  . Esophagogastroduodenoscopy N/A 11/22/2015    Procedure: ESOPHAGOGASTRODUODENOSCOPY (EGD);  Surgeon: Carol Ada, MD;  Location: [facility] ENDOSCOPY;  Service: Endoscopy;  Laterality: N/A;  Uncovered duodenal stent placement.  Fluoroscopy required.  . Duodenal stent placement N/A 11/22/2015    Procedure: DUODENAL STENT PLACEMENT;  Surgeon: Carol Ada, MD;  Location: [facility] ENDOSCOPY;   Service: Endoscopy;  Laterality: N/A;    Allergies: Codeine  Medications: Prior to Admission medications   Medication Sig Start Date End Date Taking? Authorizing Provider  BD PEN NEEDLE NANO U/F 32G X 4 MM MISC USE TO INJECT INSULIN TWICE A DAY 07/26/15  Yes [name], MD  Insulin Detemir (LEVEMIR FLEXTOUCH) 100 UNIT/ML Pen Inject 20 Units into the skin daily at 10 pm. Patient taking differently: Inject 40 Units into the skin daily with breakfast.  05/25/15  Yes [name], MD  lipase/protease/amylase (CREON) 12000 units CPEP capsule Take 24,000 Units by mouth 3 (three) times daily before meals.   Yes Historical Provider, MD  Multiple Vitamin (MULTIVITAMIN) tablet Take 1 tablet by mouth every morning. CentrumSilver   Yes Historical Provider, MD  diazepam (VALIUM) 10 MG tablet Take 10 mg by mouth once. For MRIs. 11/14/15   Historical Provider, MD  loperamide (IMODIUM A-D) 2 MG tablet Take 2 mg by mouth 4 (four) times daily as needed for diarrhea or loose stools.    Historical Provider, MD     Family History  Problem Relation Age of Onset  . Diabetes Mother   . Hypertension Mother   . Cancer Father     lung 69  . Hypertension Father   . Cancer Brother     pancreatic   . Diabetes Brother   . Cancer Paternal Uncle     prostate  . Cancer Paternal Grandfather     colon    Social History   Social History  . Marital Status: Married    Spouse Name: Butch Penny  . Number  of Children: 0  . Years of Education: N/A   Occupational History  . retired     Pharmacist, hospital   Social History Main Topics  . Smoking status: Never Smoker   . Smokeless tobacco: Never Used  . Alcohol Use: No  . Drug Use: No  . Sexual Activity: Yes    Birth Control/ Protection: None     Comment: married to [place].  Retired Corporate investment banker   Other Topics Concern  . None   Social History Narrative   Married, wife Butch Penny   No children   Retired Art therapist      Review of Systems    Constitutional: Positive for appetite change and unexpected weight change. Negative for fever and chills.  Respiratory: Negative for cough and shortness of breath.   Cardiovascular: Negative for chest pain.  Gastrointestinal: Positive for abdominal pain. Negative for nausea, vomiting and blood in stool.  Genitourinary: Negative for dysuria and hematuria.  Musculoskeletal: Negative for back pain.  Skin:       jaundiced  Neurological: Negative for headaches.    Vital Signs: Blood pressure 126/76, heart rate 62, temperature 98.4, oxygen saturation is 97% room air   Physical Exam  Constitutional: He is oriented to person, place, and time. He appears well-developed and well-nourished.  Eyes: Scleral icterus is present.  Cardiovascular: Normal rate and regular rhythm.   Pulmonary/Chest: Effort normal and breath sounds normal.  Abdominal: Soft. Bowel sounds are normal.  "fullness" of epigastric region per pt but not sig tender  Musculoskeletal: Normal range of motion.  Neurological: He is alert and oriented to person, place, and time.  Skin:  jaundiced    Mallampati Score:     Imaging: Dg Abd 1 View - Kub  11/22/2015  CLINICAL DATA:  Duodenal stent placement via endoscopy. EXAM: DG C-ARM 61-120 MIN; ABDOMEN - 1 VIEW COMPARISON:  None. FINDINGS: Examination demonstrates a curvilinear metallic stent over the midline of the mid abdomen convex right compatible recent placement of duodenal stent. The stent is more narrow over its superior aspect compared to its inferior aspect. There are minimal degenerative changes of the spine. Recommend correlation with findings at the time of the procedure. IMPRESSION: Evidence of patient's duodenal stent. Electronically Signed   By: Marin Olp M.D.   On: 11/22/2015 14:36   Mr Abdomen W Wo Contrast  11/15/2015  CLINICAL DATA:  Abnormal liver enzymes. Evaluate for liver metastasis. EXAM: MRI ABDOMEN WITHOUT AND WITH CONTRAST TECHNIQUE: Multiplanar  multisequence MR imaging of the abdomen was performed both before and after the administration of intravenous contrast. CONTRAST:  57m MULTIHANCE GADOBENATE DIMEGLUMINE 529 MG/ML IV SOLN COMPARISON:  None FINDINGS: Lower chest:  No pleural or pericardial effusion identified. Hepatobiliary: Innumerable lesions are identified throughout both lobes of liver compatible with widespread liver metastases. Index lesion within lateral segment of left lobe measures 2.5 cm, image number 10 of series 11. Index lesion within segment 7 of the liver measures 2.3 cm, image number 8 of series 11. Index lesion within the segment 6 of the liver measures 3.2 cm, image 20 of series 11. The gallbladder appears normal. There is moderate intrahepatic bile duct dilatation. The common bile duct is increased in caliber measuring up to 1.5 cm. Pancreas: There is a mass within the head of pancreas which measures 3.3 x 3.6 by 3.6 cm. This obstructs the common bile duct and pancreatic duct. There is atrophy of the body and tail of pancreas with increase in  caliber of the distal pancreatic duct. The mass within head of pancreas partially encases the portal vein at the level of the portal venous confluence. The splenic vein remains patent. There is no involvement of the celiac trunk or superior mesenteric artery. Spleen: Negative Adrenals/Urinary Tract: Normal appearance of the adrenal glands. The kidneys are unremarkable. Stomach/Bowel: The stomach and the small bowel loops have a normal course and caliber. No bowel obstruction identified. Vascular/Lymphatic: Normal appearance of the abdominal aorta. No significant upper abdominal adenopathy. Other: No free fluid or fluid collections. Musculoskeletal: Normal signal identified from within the bone marrow. IMPRESSION: 1. Examination is remarkable for mass within the head of pancreas which is obstructing the pancreatic duct and common bile duct. Finding is worrisome for primary pancreatic  adenocarcinoma. 2. Extensive liver metastasis. Electronically Signed   By: Kerby Moors M.D.   On: 11/15/2015 15:11   Dg C-arm 1-60 Min  11/22/2015  CLINICAL DATA:  Duodenal stent placement via endoscopy. EXAM: DG C-ARM 61-120 MIN; ABDOMEN - 1 VIEW COMPARISON:  None. FINDINGS: Examination demonstrates a curvilinear metallic stent over the midline of the mid abdomen convex right compatible recent placement of duodenal stent. The stent is more narrow over its superior aspect compared to its inferior aspect. There are minimal degenerative changes of the spine. Recommend correlation with findings at the time of the procedure. IMPRESSION: Evidence of patient's duodenal stent. Electronically Signed   By: Marin Olp M.D.   On: 11/22/2015 14:36   US Abdomen Limited Ruq  11/11/2015  CLINICAL DATA:  Abnormal LFTs.  6 pound weight loss in last month. EXAM: US ABDOMEN LIMITED - RIGHT UPPER QUADRANT COMPARISON:  None. FINDINGS: Gallbladder: Sludge within the gallbladder. No stones or wall thickening. Negative sonographic Murphy's. Common bile duct: Diameter: Dilated, 14 mm. Questionable mass in the region the pancreatic head measuring up to 4.5 cm. Liver: Multiple hypoechoic lesions within the liver. Bowel some of these appear cystic, others appear to represent solid hypoechoic lesions. Cannot exclude metastases. IMPRESSION: Abnormal study, with multiple hypoechoic lesions within the liver, many of which appear solid. Common bile duct is dilated to 14 mm with possible pancreatic head mass. Sludge within the gallbladder. Recommend further evaluation with MRI of the abdomen with and without contrast. Electronically Signed   By: Rolm Baptise M.D.   On: 11/11/2015 12:16    Labs:  CBC:  Recent Labs  06/16/15 0839 09/19/15 0838 11/10/15 1518 11/25/15 0753  WBC 8.1 9.0 8.9 9.7  HGB 13.9 13.4 11.3* 11.0*  HCT 42.0 39.8 34.2* 32.8*  PLT 210 250 259 235    COAGS:  Recent Labs  11/25/15 0753  INR 1.07    APTT 27    BMP:  Recent Labs  06/16/15 0839 09/19/15 0838 11/03/15 0839 11/25/15 0753  NA 138 140 139 139  K 5.2 5.4* 4.3 3.8  CL 100 101 100 105  CO2 '25 28 28 23  '$ GLUCOSE 162* 155* 101* 167*  BUN 26* '15 20 15  '$ CALCIUM 9.5 9.9 9.5 9.5  CREATININE 1.30* 0.97 0.98 0.74  GFRNONAA 56* 80 79 >60  GFRAA 65 >89 >89 >60    LIVER FUNCTION TESTS:  Recent Labs  09/19/15 0838 11/03/15 0839 11/22/15 1448 11/25/15 0753  BILITOT 0.5 0.7 5.6* 6.8*  AST 27 107* 97* 101*  ALT 23 150* 122* 123*  ALKPHOS 174* 365* 526* 544*  PROT 7.1 6.8 6.2* 7.0  ALBUMIN 4.5 4.2 3.4* 3.6    TUMOR MARKERS:  Recent Labs  11/10/15 1518  AFPTM 1.8    Assessment and Plan: 70 y.o. diabetic male with history of newly diagnosed metastatic pancreatic carcinoma and biliary obstruction with recent duodenal stent placement. Recent MRI abdomen has revealed extensive liver metastases as well as obstruction of the common bile duct and pancreatic duct by the pancreatic mass with partial encasement of the portal vein at the level of the portal venous confluence. Request now received for PTC with biliary drain placement and he presents today for the procedure. Total bilirubin today is 6.8. Details/risks of procedure, including but not limited to, internal bleeding, infection/sepsis, injury to adjacent organs discussed with patient and wife with their understanding and consent.    Thank you for this interesting consult.  I greatly enjoyed meeting Jacob Hardin and look forward to participating in their care.  A copy of this report was sent to the requesting provider on this date.  Electronically Signed: D. Rowe Pieter 11/25/2015, 8:30 AM   I spent a total of 15 minutes in face to face in clinical consultation, greater than 50% of which was counseling/coordinating care for percutaneous transhepatic cholangiogram with biliary drain placement

## 2015-11-25 NOTE — Procedures (Signed)
R int/ext biliary drain catheter + cholangiogram No complication No blood loss. See complete dictation in [facility].

## 2015-11-25 NOTE — Discharge Instructions (Signed)
Biliary Drainage Catheter Home Guide A biliary drainage catheter is a tube that is inserted through your skin into the bile ducts in your liver. The purpose of a biliary drainage catheter is to prevent backup of bile into the liver. Bile is a thick yellow or green fluid that helps digest fat in foods. Backup of bile can occur when there is a blockage preventing bile from moving from the bile ducts into your small intestine, as it normally should. This can occur from a tumor, gallstones, or scar tissue. There are three types of biliary drainage:  External biliary drainage--With this type, bile is only drained into a collection bag outside your body (external collection bag).  Internal-external biliary drainage--Bile is drained to an external collection bag as well as into your small intestine.  Internal biliary drainage--Bile is only drained into your small intestine. HOW DO I CHANGE MY DRESSING? The dressing over the drain should be changed at least every other day or more frequently if needed to keep the dressing dry.  1. Wash your hands with soap and water. 2. Gently remove the old dressing. Avoid using scissors to remove the dressing because this may lead to accidental damage to the drain. 3. Once the dressing is removed, inspect the skin around the drain for redness, swelling, and foul smelling yellow or green discharge. 4. If the drain was sutured to the skin, inspect the suture to verify that it is still anchored in the skin. 5. Clean the skin around the insertion site with mild soap and warm water. Pat the area dry with a clean cloth. 6. Do not apply creams, ointments, or alcohol to the site. Allow the skin to air dry completely before applying a new dressing. 7. Use a drain sponge (4x4 split gauze) and place the drain through the slit. Cover the drain and the first gauze with a 4x4 gauze. The drain should rest on the gauze and not on the skin. 8. Tape the dressing to the skin. 9. Wash your  hands with soap and water. HOW DO I FLUSH A DRAIN NOT ATTACHED TO A BAG? Biliary drains should be flushed daily unless you are instructed otherwise by a health care provider. The end of the drain is closed using an IV cap to which a syringe can be directly connected.  1. Clean the IV cap with an alcohol swab and then screw the tip of a 10 ml normal saline syringe onto the IV cap. 2. Inject the saline over 5-10 seconds. If you feel resistance while injecting, stop immediately. 3. Remove the syringe from the cap. HOW DO I ATTACH A BAG TO MY DRAIN? If you are having trouble with your biliary drain, you may be directed by your health care provider to use bag drainage until you can be seen to fix the problem. You should always have a drainage bag and connecting tubing at home for this reason. If you do not, remember to ask for these at your next appointment.  1. Remove the bag and connecting tubing from their packaging. 2. Connect the funnel end of the tubing to the bag's cone-shaped stem. 3. Remove the IV cap from the biliary drain by unscrewing it and replace it with the screw-on end of the tubing. 4. Save the IV cap in a sealable plastic storage bag. HOW DO I EMPTY MY DRAINAGE BAG? The drainage bag should be emptied when it becomes 2/3 full or before you go to sleep. Most drainage bags have a drainage  valve at the bottom that allows them to be emptied easily. 1. Hold the bag over the toilet or basin (or measuring container, if you are directed to measure the drainage). 2. Unscrew the valve to open it, and allow the bag to drain. 3. Close the valve securely to avoid leakage, and wipe it clean with a tissue or disposable napkin. SEEK MEDICAL CARE IF:  Your pain gets worse after an initial improvement.  You have any questions about your tube.  Your redness, soreness, or swelling at the tube insertion site gets worse despite good cleaning.  Your skin breaks down around the tube.  You have  leakage of bile around the tube.  Your tube becomes blocked or clogged.  Your catheter is dislodged or comes out.  You have a fever.  You have chills or increased pain. MAKE SURE YOU:  Understand these instructions.  Will watch your condition.  Will get help right away if you are not doing well or get worse.   This information is not intended to replace advice given to you by your health care provider. Make sure you discuss any questions you have with your health care provider.   Document Released: 07/29/2013 Document Revised: 10/13/2013 Document Reviewed: 07/29/2013 Elsevier Interactive Patient Education 2016 [place] Drainage Catheter Placement A biliary drainage catheter is a tube that is inserted through your skin into the bile ducts in your liver. This is sometimes called a percutaneous transhepatic biliary drainage catheter. The purpose of a biliary drainage catheter is to keep bile from backing up into the liver. Bile is a thick yellow or green fluid that helps digest fat in foods. Backup of bile can occur when there is a blockage stopping bile from moving from the bile ducts into your small intestine, as it normally should. This can occur from a tumor or scar tissue. There are three types of biliary drainage. These are:   External biliary drainage. This is where bile is only drained into a collection bag outside your body (external collection bag).   Internal-external biliary drainage. This is where bile is drained to both an outside collection bag as well as into your small intestine.   Internal biliary drainage. This is where bile is only drained into your small intestine.  LET [facility] CARE PROVIDER KNOW ABOUT: 10. Any allergies you have. 11. All medicines you are taking, including vitamins, herbs, eye drops, creams, and over-the-counter medicines. 12. Previous problems you or members of your family have had with the use of anesthetics. 13. Any blood  disorders you have. 14. Previous surgeries you have had. 15. Medical conditions you have. 16. Possibility of pregnancy, if this applies. RISKS AND COMPLICATIONS Generally, this is a safe procedure. However, as with any procedure, problems can occur. Possible problems include: 4. Bleeding. 5. Infection. 6. Injury to the surrounding structures, such as the liver, bile ducts, intestines, or stomach. 7. Bile leak. BEFORE THE PROCEDURE 5. Your health care provider may want you to have blood tests. These tests can show how well your kidneys and liver are working. They can also show how well your blood clumps together (clots).  6. If you take blood thinners (anticoagulants), ask your health care provider about if and when you should stop taking them.  7. Make arrangements for someone to drive you home after the procedure.  PROCEDURE 4. A tube called an IV catheter will be inserted into one of your veins. Medicine will be able to flow right into  your body through this catheter. You may be given medicine through this tube to help prevent nausea, pain, or infection.  5. You will be given a medicine that makes you go to sleep (general anesthetic).  6. The biliary drainage catheter, which is thin and flexible, will be inserted into a needle.  7. The needle will be inserted into your body and guided to your liver bile duct, with the help of an imaging method that uses X-ray images (fluoroscopy). Depending on the type of biliary drainage catheter your health care provider inserts, the catheter may be advanced to your small intestine.  8. A dye will be injected through the catheter. Then, X-rays will be taken. This helps to produce pictures of the bile ducts.  9. The catheter is then left in place with the help of a stitch placed into your skin and around the tube. If an external or internal-external biliary drainage system is inserted, the catheter will exit your body and be connected to a drainage  bag. If an internal biliary drainage system is inserted, the catheter exiting your body will temporarily remain in place until its location is confirmed by X-ray.  10. The catheter will be removed once the placement of the biliary drainage system is confirmed.  AFTER THE PROCEDURE  You will stay in a recovery area until the general anesthetic has worn off. Your blood pressure and pulse will be checked.   You may have discomfort at the drain site. You will be given pain medicines to control the pain.  You will be instructed on how to empty and care for the drain.  You will need to remain lying down for several hours.   This information is not intended to replace advice given to you by your health care provider. Make sure you discuss any questions you have with your health care provider.   Document Released: 07/29/2013 Document Revised: 10/13/2013 Document Reviewed: 07/29/2013 Elsevier Interactive Patient Education 2016 [place] Drainage Catheter Placement, Care After Refer to this sheet in the next few weeks. These instructions provide you with information on caring for yourself after your procedure. Your health care provider may also give you more specific instructions. Your treatment has been planned according to current medical practices, but problems sometimes occur. Call your health care provider if you have any problems or questions after your procedure. WHAT TO EXPECT AFTER THE PROCEDURE After your procedure, it is typical to have the following:  Pain or soreness at the catheter insertion site.  Drowsiness for several hours after the procedure.  Some bruising at the catheter insertion site.  Drainage into the collection bag on the outside of your body, if you have an external drainage catheter. You might see bloody discharge in the bag for the first day or two. This should turn a yellow-green color soon afterward. HOME CARE INSTRUCTIONS  17. Do not use machinery,  drive, or make legal decisions for 24 hours after your procedure. 18. Have someone drive you home. 19. Resume your usual diet. Avoid alcoholic beverages for 24 hours after your procedure. 20. Rest for the remainder of the day. 21. Only take over-the-counter or prescription medicines for pain, discomfort, or fever as directed by your health care provider. Do not take aspirin or blood thinners unless directed otherwise. This can make bleeding worse. 22. Clean the tube insertion site as directed by your health care provider. 23. Take showers, not baths. Avoid pools and hot tubs. Before showering, cover the area with  plastic wrap and tape the edges of the plastic wrap to your skin. This is done to keep your skin dry. 24. Keep the skin around the insertion site dry. If the area gets wet, dry the skin completely. 25. Keep all follow-up appointments. SEEK MEDICAL CARE IF:  8. Your pain gets worse and is not relieved with pain medicines after an initial improvement. 9. You have any questions about your tube. 10. Your skin breaks down around the tube. 11. You have a fever. 12. You have chills. SEEK IMMEDIATE MEDICAL CARE IF:  8. Your redness, soreness, or swelling at the tube insertion site gets worse despite good cleaning. 9. You have leakage of bile around the tube. 10. Your tube becomes blocked or clogged. 11. Your catheter is dislodged or comes out.   This information is not intended to replace advice given to you by your health care provider. Make sure you discuss any questions you have with your health care provider.   Document Released: 05/22/2004 Document Revised: 10/13/2013 Document Reviewed: 06/15/2013 Elsevier Interactive Patient Education 2016 [employer] Moderate Conscious Sedation, Adult Sedation is the use of medicines to promote relaxation and relieve discomfort and anxiety. Moderate conscious sedation is a type of sedation. Under moderate conscious sedation you are less alert  than normal but are still able to respond to instructions or stimulation. Moderate conscious sedation is used during short medical and dental procedures. It is milder than deep sedation or general anesthesia and allows you to return to your regular activities sooner. LET [facility] CARE PROVIDER KNOW ABOUT:   Any allergies you have.  All medicines you are taking, including vitamins, herbs, eye drops, creams, and over-the-counter medicines.  Use of steroids (by mouth or creams).  Previous problems you or members of your family have had with the use of anesthetics.  Any blood disorders you have.  Previous surgeries you have had.  Medical conditions you have.  Possibility of pregnancy, if this applies.  Use of cigarettes, alcohol, or illegal drugs. RISKS AND COMPLICATIONS Generally, this is a safe procedure. However, as with any procedure, problems can occur. Possible problems include: 26. Oversedation. 27. Trouble breathing on your own. You may need to have a breathing tube until you are awake and breathing on your own. 28. Allergic reaction to any of the medicines used for the procedure. BEFORE THE PROCEDURE 13. You may have blood tests done. These tests can help show how well your kidneys and liver are working. They can also show how well your blood clots. 14. A physical exam will be done. 15. Only take medicines as directed by your health care provider. You may need to stop taking medicines (such as blood thinners, aspirin, or nonsteroidal anti-inflammatory drugs) before the procedure.  16. Do not eat or drink at least 6 hours before the procedure or as directed by your health care provider. 39. Arrange for a responsible adult, family member, or friend to take you home after the procedure. He or she should stay with you for at least 24 hours after the procedure, until the medicine has worn off. PROCEDURE  12. An intravenous (IV) catheter will be inserted into one of your veins.  Medicine will be able to flow directly into your body through this catheter. You may be given medicine through this tube to help prevent pain and help you relax. 13. The medical or dental procedure will be done. AFTER THE PROCEDURE 11. You will stay in a recovery area until the medicine  has worn off. Your blood pressure and pulse will be checked.  12. Depending on the procedure you had, you may be allowed to go home when you can tolerate liquids and your pain is under control.   This information is not intended to replace advice given to you by your health care provider. Make sure you discuss any questions you have with your health care provider.   Document Released: 07/03/2001 Document Revised: 10/29/2014 Document Reviewed: 06/15/2013 Elsevier Interactive Patient Education [employer].

## 2015-11-29 ENCOUNTER — Ambulatory Visit (HOSPITAL_BASED_OUTPATIENT_CLINIC_OR_DEPARTMENT_OTHER): Payer: Medicare Other | Admitting: Oncology

## 2015-11-29 ENCOUNTER — Telehealth: Payer: Self-pay | Admitting: Oncology

## 2015-11-29 ENCOUNTER — Other Ambulatory Visit: Payer: Self-pay | Admitting: Radiology

## 2015-11-29 ENCOUNTER — Telehealth: Payer: Self-pay | Admitting: *Deleted

## 2015-11-29 VITALS — BP 119/64 | HR 62 | Temp 98.1°F | Resp 18 | Ht 72.0 in | Wt 164.9 lb

## 2015-11-29 DIAGNOSIS — C250 Malignant neoplasm of head of pancreas: Secondary | ICD-10-CM | POA: Diagnosis not present

## 2015-11-29 DIAGNOSIS — R17 Unspecified jaundice: Secondary | ICD-10-CM

## 2015-11-29 DIAGNOSIS — C787 Secondary malignant neoplasm of liver and intrahepatic bile duct: Secondary | ICD-10-CM | POA: Diagnosis not present

## 2015-11-29 NOTE — Telephone Encounter (Signed)
Pt confirmed labs/ov/inj/chemo class per 02/07 POF, gave pt AVS and Calendar.Cherylann Banas, sent msg to add chemo

## 2015-11-29 NOTE — Telephone Encounter (Signed)
Per staff message and POF I have scheduled appts. Advised scheduler of appts and no available on 2/15 for treatment. Moved treatment to 2/14. JMW

## 2015-11-29 NOTE — Progress Notes (Signed)
  [facility] OFFICE PROGRESS NOTE   Diagnosis: Pancreas cancer  INTERVAL HISTORY:   Jacob Hardin returns as scheduled. He underwent placement of a internal/external biliary drain on 11/25/2015. He reports soreness in the right upper abdomen following procedure. This has improved. He feels much better since undergoing placement of the drain. No diarrhea.   Objective:  Vital signs in last 24 hours:  Blood pressure 119/64, pulse 62, temperature 98.1 F (36.7 C), temperature source Oral, resp. rate 18, height 6' (1.829 m), weight 164 lb 14.4 oz (74.798 kg), SpO2 100 %.    HEENT: Mild scleral icterus Resp: Lungs clear bilaterally Cardio: Regular rate and rhythm GI: No hepatomegaly, right upper quadrant drain site with a gauze dressing, nontender Vascular: No leg edema   Lab Results:  Lab Results  Component Value Date   WBC 9.7 11/25/2015   HGB 11.0* 11/25/2015   HCT 32.8* 11/25/2015   MCV 86.1 11/25/2015   PLT 235 11/25/2015   NEUTROABS 7.4 11/25/2015     Medications: I have reviewed the patient's current medications.  Assessment/Plan: 1. 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 Diabetes  3. Anorexia/weight loss secondary to #1  4. Obstructive jaundice secondary to #1-status post placement of a percutaneous internal/external biliary drain 11/25/2015    Disposition:  The jaundice appears improved after placement of the biliary drain. He is scheduled for Port-A-Cath placement tomorrow.  I discussed systemic treatment options with Mr. Mayhew and his wife. He will not be a candidate for the SWOG study since there is not enough diagnostic tissue  available for ancillary studies. I recommend FOLFIRINOX chemotherapy.  We reviewed the potential toxicities associated with the FOLFIRINOX regimen including the chance for nausea/vomiting, mucositis, diarrhea, alopecia, and hematologic toxicity. We discussed the sun sensitivity, rash, hyperpigmentation, and hand/foot syndrome associated with 5-fluorouracil. We discussed the acute and delayed diarrhea seen with irinotecan. We reviewed the allergic reaction and various types of neuropathy associated with oxaliplatin. He agrees to proceed. He will attend a chemotherapy teaching class.  The plan is to begin a first cycle of FOLFIRINOX on 12/07/2015. He will return for an office visit and cycle 2 on 12/21/2015.  We will ask interventional radiology to internalize the biliary drain as soon as possible.  Betsy Coder, MD  11/29/2015  12:08 PM

## 2015-11-30 ENCOUNTER — Encounter (HOSPITAL_COMMUNITY): Payer: Self-pay

## 2015-11-30 ENCOUNTER — Other Ambulatory Visit: Payer: Self-pay | Admitting: Oncology

## 2015-11-30 ENCOUNTER — Ambulatory Visit (HOSPITAL_COMMUNITY)
Admission: RE | Admit: 2015-11-30 | Discharge: 2015-11-30 | Disposition: A | Discharge status: Home / Self Care | Payer: Medicare Other | Source: Ambulatory Visit | Attending: Oncology | Admitting: Oncology

## 2015-11-30 ENCOUNTER — Other Ambulatory Visit (HOSPITAL_COMMUNITY): Payer: Self-pay | Admitting: Interventional Radiology

## 2015-11-30 DIAGNOSIS — C250 Malignant neoplasm of head of pancreas: Secondary | ICD-10-CM

## 2015-11-30 DIAGNOSIS — C259 Malignant neoplasm of pancreas, unspecified: Secondary | ICD-10-CM | POA: Diagnosis not present

## 2015-11-30 DIAGNOSIS — K831 Obstruction of bile duct: Secondary | ICD-10-CM | POA: Diagnosis not present

## 2015-11-30 DIAGNOSIS — C787 Secondary malignant neoplasm of liver and intrahepatic bile duct: Secondary | ICD-10-CM | POA: Insufficient documentation

## 2015-11-30 LAB — HEPATIC FUNCTION PANEL
ALBUMIN: 3.6 g/dL (ref 3.5–5.0)
ALK PHOS: 324 U/L — AB (ref 38–126)
ALT: 51 U/L (ref 17–63)
AST: 44 U/L — AB (ref 15–41)
BILIRUBIN INDIRECT: 1.4 mg/dL — AB (ref 0.3–0.9)
Bilirubin, Direct: 1.3 mg/dL — ABNORMAL HIGH (ref 0.1–0.5)
TOTAL PROTEIN: 7 g/dL (ref 6.5–8.1)
Total Bilirubin: 2.7 mg/dL — ABNORMAL HIGH (ref 0.3–1.2)

## 2015-11-30 LAB — GLUCOSE, CAPILLARY: GLUCOSE-CAPILLARY: 127 mg/dL — AB (ref 65–99)

## 2015-11-30 LAB — CBC
HEMATOCRIT: 36 % — AB (ref 39.0–52.0)
Hemoglobin: 11.6 g/dL — ABNORMAL LOW (ref 13.0–17.0)
MCH: 28.6 pg (ref 26.0–34.0)
MCHC: 32.2 g/dL (ref 30.0–36.0)
MCV: 88.9 fL (ref 78.0–100.0)
Platelets: 335 10*3/uL (ref 150–400)
RBC: 4.05 MIL/uL — ABNORMAL LOW (ref 4.22–5.81)
RDW: 14.5 % (ref 11.5–15.5)
WBC: 11.1 10*3/uL — ABNORMAL HIGH (ref 4.0–10.5)

## 2015-11-30 LAB — BASIC METABOLIC PANEL
Anion gap: 12 (ref 5–15)
BUN: 18 mg/dL (ref 6–20)
CHLORIDE: 101 mmol/L (ref 101–111)
CO2: 23 mmol/L (ref 22–32)
Calcium: 9.3 mg/dL (ref 8.9–10.3)
Creatinine, Ser: 0.94 mg/dL (ref 0.61–1.24)
GFR calc Af Amer: >60 mL/min (ref >60)
GFR calc non Af Amer: >60 mL/min (ref >60)
GLUCOSE: 139 mg/dL — AB (ref 65–99)
POTASSIUM: 4.2 mmol/L (ref 3.5–5.1)
SODIUM: 136 mmol/L (ref 135–145)

## 2015-11-30 LAB — APTT: aPTT: 28 seconds (ref 24–37)

## 2015-11-30 LAB — PROTIME-INR
INR: 1.1 (ref 0.00–1.49)
Prothrombin Time: 13.9 seconds (ref 11.6–15.2)

## 2015-11-30 MED ORDER — ONDANSETRON HCL 4 MG/2ML IJ SOLN
4.0000 mg | Freq: Once | INTRAMUSCULAR | Status: DC | PRN
Start: 2015-11-30 — End: 2015-12-01

## 2015-11-30 MED ORDER — FENTANYL CITRATE (PF) 100 MCG/2ML IJ SOLN
INTRAMUSCULAR | Status: AC | PRN
  Administered 2015-11-30: 50 ug via INTRAVENOUS

## 2015-11-30 MED ORDER — SODIUM CHLORIDE 0.9 % IV SOLN
Freq: Once | INTRAVENOUS | Status: AC
  Administered 2015-11-30: 12:00:00 via INTRAVENOUS

## 2015-11-30 MED ORDER — LIDOCAINE-EPINEPHRINE 2 %-1:100000 IJ SOLN
INTRAMUSCULAR | Status: AC
  Filled 2015-11-30: qty 1

## 2015-11-30 MED ORDER — MIDAZOLAM HCL 2 MG/2ML IJ SOLN
INTRAMUSCULAR | Status: AC
  Filled 2015-11-30: qty 6

## 2015-11-30 MED ORDER — CEFAZOLIN SODIUM-DEXTROSE 2-3 GM-% IV SOLR
INTRAVENOUS | Status: AC
  Filled 2015-11-30: qty 50

## 2015-11-30 MED ORDER — HEPARIN SOD (PORK) LOCK FLUSH 100 UNIT/ML IV SOLN
INTRAVENOUS | Status: AC
  Filled 2015-11-30: qty 5

## 2015-11-30 MED ORDER — MIDAZOLAM HCL 2 MG/2ML IJ SOLN
INTRAMUSCULAR | Status: AC | PRN
  Administered 2015-11-30 (×4): 1 mg via INTRAVENOUS

## 2015-11-30 MED ORDER — FENTANYL CITRATE (PF) 100 MCG/2ML IJ SOLN
INTRAMUSCULAR | Status: AC
  Filled 2015-11-30: qty 4

## 2015-11-30 MED ORDER — CEFAZOLIN SODIUM-DEXTROSE 2-3 GM-% IV SOLR
2.0000 g | INTRAVENOUS | Status: AC
  Administered 2015-11-30: 2 g via INTRAVENOUS

## 2015-11-30 MED ORDER — HEPARIN SOD (PORK) LOCK FLUSH 100 UNIT/ML IV SOLN
INTRAVENOUS | Status: AC | PRN
  Administered 2015-11-30: 500 [IU]

## 2015-11-30 NOTE — Procedures (Signed)
Successful placement of right IJ approach port-a-cath with tip at the superior caval atrial junction. The catheter is ready for immediate use. Estimated Blood Loss: Minimal No immediate post procedural complications.  Jay Pixie Burgener, MD Pager #: [phone]   

## 2015-11-30 NOTE — Discharge Instructions (Signed)
Implanted Port Insertion, Care After Refer to this sheet in the next few weeks. These instructions provide you with information on caring for yourself after your procedure. Your health care provider may also give you more specific instructions. Your treatment has been planned according to current medical practices, but problems sometimes occur. Call your health care provider if you have any problems or questions after your procedure. WHAT TO EXPECT AFTER THE PROCEDURE After your procedure, it is typical to have the following:   Discomfort at the port insertion site. Ice packs to the area will help.  Bruising on the skin over the port. This will subside in 3-4 days. HOME CARE INSTRUCTIONS  You may remove the dressings and shower after 24 hours. You may wash the area as usual and gently pat dry. The surgical glue will slowly wear away over the next 7-10 days - do not pull at the glue; please allow it to wear away on its own.  Do not soak in tub baths or go swimming until cleared by your doctor to do so.  Do not use powders or lotions over the incision site until fully healed.  After your port is placed, you will get a manufacturer's information card. The card has information about your port. Keep this card with you at all times.   Know what kind of port you have. There are many types of ports available.   Wear a medical alert bracelet in case of an emergency. This can help alert health care workers that you have a port.   The port can stay in for as long as your health care provider believes it is necessary.   A home health care nurse may give medicines and take care of the port.   You or a family member can get special training and directions for giving medicine and taking care of the port at home.  SEEK MEDICAL CARE IF:   Your port does not flush or you are unable to get a blood return.   You have a fever or chills. SEEK IMMEDIATE MEDICAL CARE IF:  You have new fluid or pus  coming from your incision.   You notice a bad smell coming from your incision site.   You have swelling, pain, or more redness at the incision or port site.   You have chest pain or shortness of breath.   This information is not intended to replace advice given to you by your health care provider. Make sure you discuss any questions you have with your health care provider.   Document Released: 07/29/2013 Document Revised: 10/13/2013 Document Reviewed: 07/29/2013 Elsevier Interactive Patient Education [employer].

## 2015-11-30 NOTE — Sedation Documentation (Signed)
Patient denies pain and is resting comfortably.  

## 2015-11-30 NOTE — Progress Notes (Signed)
Patient ID: Jacob Hardin, male   DOB: September 29, 1946, 70 y.o.   MRN: [id number]    Referring Physician(s): Betsy Coder B  Chief Complaint: "I'm here to get a port a cath"   Subjective: Patient familiar to IR service from placement of internal/ external biliary drainage catheter on 11/25/15. He is a 70 y.o. male with history of newly diagnosed metastatic pancreatic carcinoma and biliary obstruction with recent duodenal stent placement. Recent MRI abdomen has revealed extensive liver metastasis as well as obstruction of the common bile duct and pancreatic duct by the pancreatic mass with partial encasement of the portal vein at the level of the portal venous confluence. He presents again today for Port-A-Cath placement for chemotherapy. He currently denies fevers, chills, headache, chest pain, dyspnea, cough, significant abdominal or back pain, nausea, vomiting or abnormal bleeding. He did experience a moderate amount of abdominal pain post PTC placement but this resolved after a few days.   Allergies: Codeine  Medications: Prior to Admission medications   Medication Sig Start Date End Date Taking? Authorizing Provider  Insulin Detemir (LEVEMIR FLEXTOUCH) 100 UNIT/ML Pen Inject 20 Units into the skin daily at 10 pm. Patient taking differently: Inject 40 Units into the skin daily with breakfast.  05/25/15  Yes [name], MD  lipase/protease/amylase (CREON) 12000 units CPEP capsule Take 24,000 Units by mouth 3 (three) times daily before meals.   Yes Historical Provider, MD  BD PEN NEEDLE NANO U/F 32G X 4 MM MISC USE TO INJECT INSULIN TWICE A DAY 07/26/15   [name], MD  loperamide (IMODIUM A-D) 2 MG tablet Take 2 mg by mouth 4 (four) times daily as needed for diarrhea or loose stools.    Historical Provider, MD  Multiple Vitamin (MULTIVITAMIN) tablet Take 1 tablet by mouth every morning. CentrumSilver    Historical Provider, MD     Vital Signs: Blood pressure 115/68, temperature  98.6, heart rate 67, respirations 16, oxygen saturation 99% room air   Physical Exam patient is awake, alert. Chest is clear to auscultation bilaterally. Heart with regular rate and rhythm. Abdomen soft, positive bowel sounds, clean, intact internal/external biliary drain catheter with green bile in bag; extremities with full range of motion and no edema.  Imaging: No results found.  Labs:  CBC:  Recent Labs  09/19/15 0838 11/10/15 1518 11/25/15 0753 11/30/15 1130  WBC 9.0 8.9 9.7 11.1*  HGB 13.4 11.3* 11.0* 11.6*  HCT 39.8 34.2* 32.8* 36.0*  PLT 250 259 235 335    COAGS:  Recent Labs  11/25/15 0753 11/30/15 1130  INR 1.07 1.10  APTT 27 28    BMP:  Recent Labs  09/19/15 0838 11/03/15 0839 11/25/15 0753 11/30/15 1130  NA 140 139 139 136  K 5.4* 4.3 3.8 4.2  CL 101 100 105 101  CO2 _0 GLUCOSE 155* 101* 167* 139*  BUN _1 CALCIUM 9.9 9.5 9.5 9.3  CREATININE 0.97 0.98 0.74 0.94  GFRNONAA 80 79 >60 >60  GFRAA >89 >89 >60 >60    LIVER FUNCTION TESTS:  Recent Labs  09/19/15 0838 11/03/15 0839 11/22/15 1448 11/25/15 0753  BILITOT 0.5 0.7 5.6* 6.8*  AST 27 107* 97* 101*  ALT 23 150* 122* 123*  ALKPHOS 174* 365* 526* 544*  PROT 7.1 6.8 6.2* 7.0  ALBUMIN 4.5 4.2 3.4* 3.6    Assessment and Plan: 70 y.o. male with history of newly diagnosed metastatic pancreatic carcinoma and biliary obstruction with  recent duodenal stent placement. Recent MRI abdomen has revealed extensive liver metastasis as well as obstruction of the common bile duct and pancreatic duct by the pancreatic mass with partial encasement of the portal vein at the level of the portal venous confluence. He is s/p I/E biliary drain placement on 11/25/15.   He presents again today for Port-A-Cath placement for chemotherapy. Risks and benefits discussed with the patient/wife including, but not limited to bleeding, infection, pneumothorax, or fibrin sheath development and need for  additional procedures.All of the patient's questions were answered, patient is agreeable to proceed.Consent signed and in chart.     Electronically Signed: D. Rowe Macsen 11/30/2015, 1:13 PM   I spent a total of 15 minutes at the the patient's bedside AND on the patient's hospital floor or unit, greater than 50% of which was counseling/coordinating care for port a cath placement

## 2015-12-01 ENCOUNTER — Other Ambulatory Visit: Payer: Self-pay | Admitting: *Deleted

## 2015-12-01 ENCOUNTER — Telehealth: Payer: Self-pay | Admitting: Oncology

## 2015-12-01 DIAGNOSIS — C250 Malignant neoplasm of head of pancreas: Secondary | ICD-10-CM

## 2015-12-01 NOTE — Telephone Encounter (Signed)
cld & spoke to Jacob Hardin pt spouse and adv pt to come in @ 9:00 to have labs

## 2015-12-02 ENCOUNTER — Other Ambulatory Visit (HOSPITAL_BASED_OUTPATIENT_CLINIC_OR_DEPARTMENT_OTHER): Payer: Medicare Other

## 2015-12-02 ENCOUNTER — Ambulatory Visit (HOSPITAL_COMMUNITY)
Admission: RE | Admit: 2015-12-02 | Discharge: 2015-12-02 | Disposition: A | Discharge status: Home / Self Care | Payer: Medicare Other | Source: Ambulatory Visit | Attending: Interventional Radiology | Admitting: Interventional Radiology

## 2015-12-02 ENCOUNTER — Other Ambulatory Visit: Payer: Self-pay | Admitting: *Deleted

## 2015-12-02 ENCOUNTER — Other Ambulatory Visit (HOSPITAL_COMMUNITY): Payer: Self-pay | Admitting: Interventional Radiology

## 2015-12-02 ENCOUNTER — Encounter: Payer: Self-pay | Admitting: *Deleted

## 2015-12-02 ENCOUNTER — Ambulatory Visit: Payer: Medicare Other

## 2015-12-02 DIAGNOSIS — C250 Malignant neoplasm of head of pancreas: Secondary | ICD-10-CM

## 2015-12-02 LAB — COMPREHENSIVE METABOLIC PANEL
ALBUMIN: 3.6 g/dL (ref 3.5–5.0)
ALK PHOS: 361 U/L — AB (ref 40–150)
ALT: 51 U/L (ref 0–55)
ANION GAP: 14 meq/L — AB (ref 3–11)
AST: 50 U/L — ABNORMAL HIGH (ref 5–34)
BILIRUBIN TOTAL: 2.74 mg/dL — AB (ref 0.20–1.20)
BUN: 17 mg/dL (ref 7.0–26.0)
CO2: 24 meq/L (ref 22–29)
Calcium: 10.4 mg/dL (ref 8.4–10.4)
Chloride: 101 mEq/L (ref 98–109)
Creatinine: 1.1 mg/dL (ref 0.7–1.3)
EGFR: 67 mL/min/{1.73_m2} — AB (ref >90)
GLUCOSE: 224 mg/dL — AB (ref 70–140)
POTASSIUM: 4.9 meq/L (ref 3.5–5.1)
Sodium: 139 mEq/L (ref 136–145)
TOTAL PROTEIN: 7.8 g/dL (ref 6.4–8.3)

## 2015-12-02 MED ORDER — LIDOCAINE-PRILOCAINE 2.5-2.5 % EX CREA: TOPICAL_CREAM | CUTANEOUS | Status: DC

## 2015-12-02 MED ORDER — PROCHLORPERAZINE MALEATE 10 MG PO TABS: 10.0000 mg | ORAL_TABLET | Freq: Four times a day (QID) | ORAL | Status: DC | PRN

## 2015-12-02 NOTE — Progress Notes (Signed)
Spent time with patient and his wife in chemo class discussing side effects of Folfirinox.  See Education notes

## 2015-12-02 NOTE — Procedures (Signed)
Patient came in today to see if we could cap the biliary int/ext catheter.  Per Dr Kathlene Cote, if the bilirubin is stable or less than previous day then we can cap the catheter.  The bili was basically stable ay 2.74/  The catheter ws capped and the patient was educated on how to connect a new drainage bag if necessary.  He has no further questions.

## 2015-12-03 ENCOUNTER — Other Ambulatory Visit: Payer: Self-pay | Admitting: Oncology

## 2015-12-06 ENCOUNTER — Other Ambulatory Visit (HOSPITAL_BASED_OUTPATIENT_CLINIC_OR_DEPARTMENT_OTHER): Payer: Medicare Other

## 2015-12-06 ENCOUNTER — Ambulatory Visit: Payer: Medicare Other | Admitting: Nutrition

## 2015-12-06 ENCOUNTER — Telehealth: Payer: Self-pay | Admitting: Nurse Practitioner

## 2015-12-06 ENCOUNTER — Encounter: Payer: Self-pay | Admitting: *Deleted

## 2015-12-06 ENCOUNTER — Ambulatory Visit (HOSPITAL_BASED_OUTPATIENT_CLINIC_OR_DEPARTMENT_OTHER): Payer: Medicare Other | Admitting: Nurse Practitioner

## 2015-12-06 ENCOUNTER — Ambulatory Visit (HOSPITAL_BASED_OUTPATIENT_CLINIC_OR_DEPARTMENT_OTHER): Payer: Medicare Other

## 2015-12-06 ENCOUNTER — Ambulatory Visit: Payer: Medicare Other

## 2015-12-06 ENCOUNTER — Other Ambulatory Visit: Payer: Self-pay | Admitting: Diagnostic Radiology

## 2015-12-06 ENCOUNTER — Telehealth: Payer: Self-pay | Admitting: *Deleted

## 2015-12-06 VITALS — BP 126/64 | HR 68 | Temp 98.3°F | Resp 18 | Ht 72.0 in | Wt 165.1 lb

## 2015-12-06 VITALS — BP 147/69 | HR 59 | Temp 98.0°F

## 2015-12-06 DIAGNOSIS — E119 Type 2 diabetes mellitus without complications: Secondary | ICD-10-CM

## 2015-12-06 DIAGNOSIS — C787 Secondary malignant neoplasm of liver and intrahepatic bile duct: Secondary | ICD-10-CM | POA: Diagnosis not present

## 2015-12-06 DIAGNOSIS — C250 Malignant neoplasm of head of pancreas: Secondary | ICD-10-CM | POA: Diagnosis not present

## 2015-12-06 DIAGNOSIS — Z5111 Encounter for antineoplastic chemotherapy: Secondary | ICD-10-CM | POA: Diagnosis not present

## 2015-12-06 DIAGNOSIS — C259 Malignant neoplasm of pancreas, unspecified: Secondary | ICD-10-CM

## 2015-12-06 LAB — CBC WITH DIFFERENTIAL/PLATELET
BASO%: 0.6 % (ref 0.0–2.0)
BASOS ABS: 0.1 10*3/uL (ref 0.0–0.1)
EOS%: 2.8 % (ref 0.0–7.0)
Eosinophils Absolute: 0.3 10*3/uL (ref 0.0–0.5)
HEMATOCRIT: 32 % — AB (ref 38.4–49.9)
HGB: 10.6 g/dL — ABNORMAL LOW (ref 13.0–17.1)
LYMPH%: 9.9 % — AB (ref 14.0–49.0)
MCH: 28.6 pg (ref 27.2–33.4)
MCHC: 33 g/dL (ref 32.0–36.0)
MCV: 86.9 fL (ref 79.3–98.0)
MONO#: 0.7 10*3/uL (ref 0.1–0.9)
MONO%: 7.6 % (ref 0.0–14.0)
NEUT%: 79.1 % — AB (ref 39.0–75.0)
NEUTROS ABS: 7.4 10*3/uL — AB (ref 1.5–6.5)
PLATELETS: 238 10*3/uL (ref 140–400)
RBC: 3.69 10*6/uL — AB (ref 4.20–5.82)
RDW: 14.6 % (ref 11.0–14.6)
WBC: 9.3 10*3/uL (ref 4.0–10.3)
lymph#: 0.9 10*3/uL (ref 0.9–3.3)

## 2015-12-06 LAB — COMPREHENSIVE METABOLIC PANEL
ALBUMIN: 3 g/dL — AB (ref 3.5–5.0)
ALK PHOS: 387 U/L — AB (ref 40–150)
ALT: 50 U/L (ref 0–55)
ANION GAP: 10 meq/L (ref 3–11)
AST: 46 U/L — ABNORMAL HIGH (ref 5–34)
BILIRUBIN TOTAL: 1.67 mg/dL — AB (ref 0.20–1.20)
BUN: 13.5 mg/dL (ref 7.0–26.0)
CALCIUM: 8.9 mg/dL (ref 8.4–10.4)
CO2: 24 meq/L (ref 22–29)
CREATININE: 1 mg/dL (ref 0.7–1.3)
Chloride: 104 mEq/L (ref 98–109)
EGFR: 80 mL/min/{1.73_m2} — AB (ref >90)
Glucose: 256 mg/dl — ABNORMAL HIGH (ref 70–140)
Potassium: 3.9 mEq/L (ref 3.5–5.1)
Sodium: 138 mEq/L (ref 136–145)
TOTAL PROTEIN: 6.4 g/dL (ref 6.4–8.3)

## 2015-12-06 MED ORDER — PALONOSETRON HCL INJECTION 0.25 MG/5ML
INTRAVENOUS | Status: AC
  Filled 2015-12-06: qty 5

## 2015-12-06 MED ORDER — ATROPINE SULFATE 1 MG/ML IJ SOLN
0.5000 mg | Freq: Once | INTRAMUSCULAR | Status: AC | PRN
  Administered 2015-12-06: 0.5 mg via INTRAVENOUS

## 2015-12-06 MED ORDER — FOSAPREPITANT DIMEGLUMINE INJECTION 150 MG
Freq: Once | INTRAVENOUS | Status: AC
  Administered 2015-12-06: 11:00:00 via INTRAVENOUS
  Filled 2015-12-06: qty 5

## 2015-12-06 MED ORDER — OXALIPLATIN CHEMO INJECTION 100 MG/20ML
85.0000 mg/m2 | Freq: Once | INTRAVENOUS | Status: AC
  Administered 2015-12-06: 165 mg via INTRAVENOUS
  Filled 2015-12-06: qty 33

## 2015-12-06 MED ORDER — LEUCOVORIN CALCIUM INJECTION 350 MG
400.0000 mg/m2 | Freq: Once | INTRAMUSCULAR | Status: AC
  Administered 2015-12-06: 780 mg via INTRAVENOUS
  Filled 2015-12-06: qty 39

## 2015-12-06 MED ORDER — DEXTROSE 5 % IV SOLN
Freq: Once | INTRAVENOUS | Status: AC
  Administered 2015-12-06: 10:00:00 via INTRAVENOUS

## 2015-12-06 MED ORDER — ATROPINE SULFATE 1 MG/ML IJ SOLN
INTRAMUSCULAR | Status: AC
  Filled 2015-12-06: qty 1

## 2015-12-06 MED ORDER — PALONOSETRON HCL INJECTION 0.25 MG/5ML
0.2500 mg | Freq: Once | INTRAVENOUS | Status: AC
  Administered 2015-12-06: 0.25 mg via INTRAVENOUS

## 2015-12-06 MED ORDER — FLUOROURACIL CHEMO INJECTION 5 GM/100ML
2400.0000 mg/m2 | INTRAVENOUS | Status: DC
  Administered 2015-12-06: 4700 mg via INTRAVENOUS
  Filled 2015-12-06: qty 94

## 2015-12-06 MED ORDER — IRINOTECAN HCL CHEMO INJECTION 100 MG/5ML
174.0000 mg/m2 | Freq: Once | INTRAVENOUS | Status: AC
  Administered 2015-12-06: 340 mg via INTRAVENOUS
  Filled 2015-12-06: qty 5.67

## 2015-12-06 NOTE — Telephone Encounter (Signed)
Per staff message and POF I have scheduled appts. Advised scheduler of appts. JMW  

## 2015-12-06 NOTE — Progress Notes (Signed)
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  Past medical history includes diabetes, hyperlipidemia, postop nausea vomiting, and hypertension.  Medications include Creon, Imodium, multivitamin, Levemir and Humalog.  Labs include albumin 3.6, glucose 167 on February 3.  Height: 6 feet 0 inches. Weight: 165.1 pounds. Usual body weight 180 pounds. BMI: 22.39.  Patient reports improved appetite after biliary drain and duodenal stent were placed. He has been drinking one oral nutrition supplement daily. Has been eating about 3 meals a day but doesn't typically snack "because he is a diabetic".  Nutrition diagnosis:  Unintended weight loss related to new diagnosis of pancreas cancer and associated treatments as evidenced by no prior need for nutrition related information.  Intervention:  Patient was educated to consume small frequent meals and snacks utilizing high-calorie high-protein foods. Discouraged patient losing any further weight. Reviewed high protein foods and appropriate snacks.  I provided fact sheet on increasing calories and protein. Discussed oral nutrition supplements in detail and provided information and coupons. Questions were answered.  Teach back method used.  Contact information was given.  Monitoring, evaluation, goals: Patient will tolerate adequate calories and protein to minimize weight loss.  Next visit: Wednesday, March 1 during infusion.  **Disclaimer: This note was dictated with voice recognition software. Similar sounding words can inadvertently be transcribed and this note may contain transcription errors which may not have been corrected upon publication of note.**

## 2015-12-06 NOTE — Telephone Encounter (Signed)
Pt confirmed labs/ov per 02/14 POF, gave pt AVS and Calendar..... KJ, sent msg to add chemo °

## 2015-12-06 NOTE — Progress Notes (Signed)
OK to treat per Dr. [name] with today's labs and Bilirubin of 1.67. 1420 Patient having a sensation mid chest that feels like "I need to burp." The sensation lasts about 3 seconds then goes away.  1430 Patient had a series of hiccups and sensations stopped. Vital signs stable.  1557: Patient had "rumbly tummy" . Atropine 0.5 mg  Administered.

## 2015-12-06 NOTE — Progress Notes (Signed)
Oncology Nurse Navigator Documentation  Oncology Nurse Navigator Flowsheets 12/06/2015  Navigator Location [facility]  Navigator Encounter Type Treatment  Abnormal Finding Date -  Confirmed Diagnosis Date -  Treatment Initiated Date 12/06/2015  Patient Visit Type MedOnc  Treatment Phase First Chemo Tx--FOLFIRINOX  Barriers/Navigation Needs Education  Education Other--asking when he can go to IR to get external drain internalized?  Interventions Other--message to Dr. [name] with his question  Education Method -  Support Groups/Services -  Acuity Level 1  Time Spent with Patient -  Met with patient during initial chemotherapy treatment to provide support and assess for needs to promote continuity of care He has his antiemetics and has no questions regarding chemo, just about his drain. This was forwarded to MD.  Merceda Elks, RN, BSN GI Oncology [place]

## 2015-12-06 NOTE — Progress Notes (Addendum)
  Jacob Hardin OFFICE PROGRESS NOTE   Diagnosis:  Pancreas cancer  INTERVAL HISTORY:   Jacob Hardin returns as scheduled. He has mild pain at the drain site. The pain overall is improving. He denies nausea/vomiting. No diarrhea. No fever. Appetite is better. He is gaining weight.  Objective:  Vital signs in last 24 hours:  Blood pressure 126/64, pulse 68, temperature 98.3 F (36.8 C), temperature source Oral, resp. rate 18, height 6' (1.829 m), weight 165 lb 1.6 oz (74.889 kg), SpO2 100 %.    HEENT: No thrush or ulcers. Resp: Lungs clear bilaterally. Cardio: Regular rate and rhythm. GI: Abdomen is soft and nontender. Right upper quadrant drain site is bandaged. No hepatomegaly. Vascular: No leg edema.  Port a cath without erythema.  Lab Results:  Lab Results  Component Value Date   WBC 9.3 12/06/2015   HGB 10.6* 12/06/2015   HCT 32.0* 12/06/2015   MCV 86.9 12/06/2015   PLT 238 12/06/2015   NEUTROABS 7.4* 12/06/2015    Imaging:  No results found.  Medications: I have reviewed the patient's current medications.  Assessment/Plan: 1. Pancreas cancer, stage IV, pancreas head mass, status post an EUS biopsy 11/18/2015 confirming adenocarcinoma  Upper endoscopy 11/18/2015 confirmed duodenal invasion/obstruction with a biopsy confirming adenocarcinoma  Placement of a duodenal stent 11/22/2015  MRI abdomen 11/15/2015 revealed a pancreas head mass and liver metastases  Cycle 1 FOLFIRINOX 12/06/2015  2. Diabetes  3. Anorexia/weight loss secondary to #1  4. Obstructive jaundice secondary to #1-status post placement of a percutaneous internal/external biliary drain 11/25/2015; biliary drainage catheter capped 12/02/2015  5.    Port-A-Cath placement 11/30/2015 interventional radiology    Disposition: Jacob Hardin appears stable. Plan to proceed with cycle 1 FOLFIRINOX today as scheduled. Neulasta on the day of pump discontinuation.  Bilirubin  continues to improve. We will contact interventional radiology regarding internalizing the biliary drain.  He will return for a follow-up visit and cycle 2 FOLFIRINOX on 12/21/2015. He will contact the office in the interim with any problems.  Patient seen with Dr. [name].  [name] ANP/GNP-BC   12/06/2015  9:50 AM  This was a shared visit with [name]. Jacob Hardin Will proceed with a first cycle of FOLFIRINOX today. The bilirubin continues to improve with the internal/external drain. We will refer him to interventional radiology for placement of a stent.  He will return for an office visit and cycle 2 FOLFIRINOX in 2 weeks.  [name], M.D.

## 2015-12-07 ENCOUNTER — Ambulatory Visit: Payer: Medicare Other | Admitting: Nurse Practitioner

## 2015-12-07 ENCOUNTER — Telehealth: Payer: Self-pay | Admitting: *Deleted

## 2015-12-07 ENCOUNTER — Other Ambulatory Visit: Payer: Medicare Other

## 2015-12-07 LAB — CANCER ANTIGEN 19-9

## 2015-12-07 NOTE — Telephone Encounter (Signed)
Wife, Butch Penny called.  Patient has his first Folfirinox yesterday.  When he woke up this morning his dressing over his capped biliary drain was all green.  He slept on his back in a recliner last night.  It was clean last night when he went to sleep and he woke up with it like that.  She changed the bandage and did not see any visible sign of leaking.  Instructed her to contact radiology for them to look at this.  Reviewed this with [name] NP who saw him yesterday and she is in agreement with this.

## 2015-12-08 ENCOUNTER — Ambulatory Visit (HOSPITAL_BASED_OUTPATIENT_CLINIC_OR_DEPARTMENT_OTHER): Payer: Medicare Other

## 2015-12-08 VITALS — BP 129/67 | HR 69 | Temp 98.2°F

## 2015-12-08 DIAGNOSIS — Z452 Encounter for adjustment and management of vascular access device: Secondary | ICD-10-CM | POA: Diagnosis not present

## 2015-12-08 DIAGNOSIS — Z5189 Encounter for other specified aftercare: Secondary | ICD-10-CM

## 2015-12-08 DIAGNOSIS — C250 Malignant neoplasm of head of pancreas: Secondary | ICD-10-CM | POA: Diagnosis not present

## 2015-12-08 MED ORDER — HEPARIN SOD (PORK) LOCK FLUSH 100 UNIT/ML IV SOLN
500.0000 [IU] | Freq: Once | INTRAVENOUS | Status: AC | PRN
  Administered 2015-12-08: 500 [IU]
  Filled 2015-12-08: qty 5

## 2015-12-08 MED ORDER — SODIUM CHLORIDE 0.9% FLUSH
10.0000 mL | INTRAVENOUS | Status: DC | PRN
  Administered 2015-12-08: 10 mL
  Filled 2015-12-08: qty 10

## 2015-12-08 MED ORDER — PEGFILGRASTIM INJECTION 6 MG/0.6ML ~~LOC~~
6.0000 mg | PREFILLED_SYRINGE | Freq: Once | SUBCUTANEOUS | Status: AC
  Administered 2015-12-08: 6 mg via SUBCUTANEOUS
  Filled 2015-12-08: qty 0.6

## 2015-12-08 NOTE — Patient Instructions (Signed)
Implanted Port Home Guide An implanted port is a type of central line that is placed under the skin. Central lines are used to provide IV access when treatment or nutrition needs to be given through a person's veins. Implanted ports are used for long-term IV access. An implanted port may be placed because:   You need IV medicine that would be irritating to the small veins in your hands or arms.   You need long-term IV medicines, such as antibiotics.   You need IV nutrition for a long period.   You need frequent blood draws for lab tests.   You need dialysis.  Implanted ports are usually placed in the chest area, but they can also be placed in the upper arm, the abdomen, or the leg. An implanted port has two main parts:   Reservoir. The reservoir is round and will appear as a small, raised area under your skin. The reservoir is the part where a needle is inserted to give medicines or draw blood.   Catheter. The catheter is a thin, flexible tube that extends from the reservoir. The catheter is placed into a large vein. Medicine that is inserted into the reservoir goes into the catheter and then into the vein.  HOW WILL I CARE FOR MY INCISION SITE? Do not get the incision site wet. Bathe or shower as directed by your health care provider.  HOW IS MY PORT ACCESSED? Special steps must be taken to access the port:   Before the port is accessed, a numbing cream can be placed on the skin. This helps numb the skin over the port site.   Your health care provider uses a sterile technique to access the port.  Your health care provider must put on a mask and sterile gloves.  The skin over your port is cleaned carefully with an antiseptic and allowed to dry.  The port is gently pinched between sterile gloves, and a needle is inserted into the port.  Only "non-coring" port needles should be used to access the port. Once the port is accessed, a blood return should be checked. This helps  ensure that the port is in the vein and is not clogged.   If your port needs to remain accessed for a constant infusion, a clear (transparent) bandage will be placed over the needle site. The bandage and needle will need to be changed every week, or as directed by your health care provider.   Keep the bandage covering the needle clean and dry. Do not get it wet. Follow your health care provider's instructions on how to take a shower or bath while the port is accessed.   If your port does not need to stay accessed, no bandage is needed over the port.  WHAT IS FLUSHING? Flushing helps keep the port from getting clogged. Follow your health care provider's instructions on how and when to flush the port. Ports are usually flushed with saline solution or a medicine called heparin. The need for flushing will depend on how the port is used.   If the port is used for intermittent medicines or blood draws, the port will need to be flushed:   After medicines have been given.   After blood has been drawn.   As part of routine maintenance.   If a constant infusion is running, the port may not need to be flushed.  HOW LONG WILL MY PORT STAY IMPLANTED? The port can stay in for as long as your health care   provider thinks it is needed. When it is time for the port to come out, surgery will be done to remove it. The procedure is similar to the one performed when the port was put in.  WHEN SHOULD I SEEK IMMEDIATE MEDICAL CARE? When you have an implanted port, you should seek immediate medical care if:   You notice a bad smell coming from the incision site.   You have swelling, redness, or drainage at the incision site.   You have more swelling or pain at the port site or the surrounding area.   You have a fever that is not controlled with medicine.   This information is not intended to replace advice given to you by your health care provider. Make sure you discuss any questions you have with  your health care provider.   Document Released: 10/08/2005 Document Revised: 07/29/2013 Document Reviewed: 06/15/2013 Elsevier Interactive Patient Education 2016 [employer] Pegfilgrastim injection What is this medicine? PEGFILGRASTIM (PEG fil gra stim) is a long-acting granulocyte colony-stimulating factor that stimulates the growth of neutrophils, a type of white blood cell important in the body's fight against infection. It is used to reduce the incidence of fever and infection in patients with certain types of cancer who are receiving chemotherapy that affects the bone marrow, and to increase survival after being exposed to high doses of radiation. This medicine may be used for other purposes; ask your health care provider or pharmacist if you have questions. What should I tell my health care provider before I take this medicine? They need to know if you have any of these conditions: -kidney disease -latex allergy -ongoing radiation therapy -sickle cell disease -skin reactions to acrylic adhesives (On-Body Injector only) -an unusual or allergic reaction to pegfilgrastim, filgrastim, other medicines, foods, dyes, or preservatives -pregnant or trying to get pregnant -breast-feeding How should I use this medicine? This medicine is for injection under the skin. If you get this medicine at home, you will be taught how to prepare and give the pre-filled syringe or how to use the On-body Injector. Refer to the patient Instructions for Use for detailed instructions. Use exactly as directed. Take your medicine at regular intervals. Do not take your medicine more often than directed. It is important that you put your used needles and syringes in a special sharps container. Do not put them in a trash can. If you do not have a sharps container, call your pharmacist or healthcare provider to get one. Talk to your pediatrician regarding the use of this medicine in children. While this drug may be  prescribed for selected conditions, precautions do apply. Overdosage: If you think you have taken too much of this medicine contact a poison control center or emergency room at once. NOTE: This medicine is only for you. Do not share this medicine with others. What if I miss a dose? It is important not to miss your dose. Call your doctor or health care professional if you miss your dose. If you miss a dose due to an On-body Injector failure or leakage, a new dose should be administered as soon as possible using a single prefilled syringe for manual use. What may interact with this medicine? Interactions have not been studied. Give your health care provider a list of all the medicines, herbs, non-prescription drugs, or dietary supplements you use. Also tell them if you smoke, drink alcohol, or use illegal drugs. Some items may interact with your medicine. This list may not describe all possible   interactions. Give your health care provider a list of all the medicines, herbs, non-prescription drugs, or dietary supplements you use. Also tell them if you smoke, drink alcohol, or use illegal drugs. Some items may interact with your medicine. What should I watch for while using this medicine? You may need blood work done while you are taking this medicine. If you are going to need a MRI, CT scan, or other procedure, tell your doctor that you are using this medicine (On-Body Injector only). What side effects may I notice from receiving this medicine? Side effects that you should report to your doctor or health care professional as soon as possible: -allergic reactions like skin rash, itching or hives, swelling of the face, lips, or tongue -dizziness -fever -pain, redness, or irritation at site where injected -pinpoint red spots on the skin -red or dark-brown urine -shortness of breath or breathing problems -stomach or side pain, or pain at the shoulder -swelling -tiredness -trouble passing urine or  change in the amount of urine Side effects that usually do not require medical attention (report to your doctor or health care professional if they continue or are bothersome): -bone pain -muscle pain This list may not describe all possible side effects. Call your doctor for medical advice about side effects. You may report side effects to FDA at 1-800-FDA-1088. Where should I keep my medicine? Keep out of the reach of children. Store pre-filled syringes in a refrigerator between 2 and 8 degrees C (36 and 46 degrees F). Do not freeze. Keep in carton to protect from light. Throw away this medicine if it is left out of the refrigerator for more than 48 hours. Throw away any unused medicine after the expiration date. NOTE: This sheet is a summary. It may not cover all possible information. If you have questions about this medicine, talk to your doctor, pharmacist, or health care provider.    2016, Elsevier/Gold Standard. (2014-10-28 14:30:14)  

## 2015-12-08 NOTE — Progress Notes (Signed)
Rondald here for pump disconnect and Neulasta injection after his 1st chemotherapy.  States that he is doing well,  Eating and drinking well, no nausea,vomiting or diarrhea.  Just having some constipation.  He is taking stool softener twice a day.  This nurse suggested him using something like Miralax of other laxatives.  Know to call if has any problems or concerns.

## 2015-12-09 ENCOUNTER — Ambulatory Visit: Payer: Medicare Other

## 2015-12-12 ENCOUNTER — Other Ambulatory Visit: Payer: Self-pay | Admitting: Radiology

## 2015-12-12 ENCOUNTER — Ambulatory Visit (HOSPITAL_COMMUNITY)
Admission: RE | Admit: 2015-12-12 | Discharge: 2015-12-12 | Disposition: A | Discharge status: Home / Self Care | Payer: Medicare Other | Source: Ambulatory Visit | Attending: Oncology | Admitting: Oncology

## 2015-12-12 ENCOUNTER — Other Ambulatory Visit: Payer: Self-pay | Admitting: General Surgery

## 2015-12-13 ENCOUNTER — Ambulatory Visit (HOSPITAL_COMMUNITY)
Admission: RE | Admit: 2015-12-13 | Discharge: 2015-12-13 | Disposition: A | Discharge status: Home / Self Care | Payer: Medicare Other | Source: Ambulatory Visit | Attending: Diagnostic Radiology | Admitting: Diagnostic Radiology

## 2015-12-13 ENCOUNTER — Encounter (HOSPITAL_COMMUNITY): Payer: Self-pay

## 2015-12-13 DIAGNOSIS — I10 Essential (primary) hypertension: Secondary | ICD-10-CM | POA: Diagnosis not present

## 2015-12-13 DIAGNOSIS — Z4803 Encounter for change or removal of drains: Secondary | ICD-10-CM | POA: Insufficient documentation

## 2015-12-13 DIAGNOSIS — E785 Hyperlipidemia, unspecified: Secondary | ICD-10-CM | POA: Diagnosis not present

## 2015-12-13 DIAGNOSIS — Z794 Long term (current) use of insulin: Secondary | ICD-10-CM | POA: Diagnosis not present

## 2015-12-13 DIAGNOSIS — E119 Type 2 diabetes mellitus without complications: Secondary | ICD-10-CM | POA: Diagnosis not present

## 2015-12-13 DIAGNOSIS — C259 Malignant neoplasm of pancreas, unspecified: Secondary | ICD-10-CM | POA: Diagnosis not present

## 2015-12-13 DIAGNOSIS — Z8249 Family history of ischemic heart disease and other diseases of the circulatory system: Secondary | ICD-10-CM | POA: Insufficient documentation

## 2015-12-13 DIAGNOSIS — K831 Obstruction of bile duct: Secondary | ICD-10-CM | POA: Insufficient documentation

## 2015-12-13 DIAGNOSIS — C787 Secondary malignant neoplasm of liver and intrahepatic bile duct: Secondary | ICD-10-CM | POA: Insufficient documentation

## 2015-12-13 LAB — PROTIME-INR
INR: 1.08 (ref 0.00–1.49)
PROTHROMBIN TIME: 14.2 s (ref 11.6–15.2)

## 2015-12-13 LAB — GLUCOSE, CAPILLARY: GLUCOSE-CAPILLARY: 164 mg/dL — AB (ref 65–99)

## 2015-12-13 LAB — CBC
HEMATOCRIT: 31.4 % — AB (ref 39.0–52.0)
Hemoglobin: 10.3 g/dL — ABNORMAL LOW (ref 13.0–17.0)
MCH: 28.5 pg (ref 26.0–34.0)
MCHC: 32.8 g/dL (ref 30.0–36.0)
MCV: 87 fL (ref 78.0–100.0)
Platelets: 197 10*3/uL (ref 150–400)
RBC: 3.61 MIL/uL — ABNORMAL LOW (ref 4.22–5.81)
RDW: 14 % (ref 11.5–15.5)
WBC: 18 10*3/uL — ABNORMAL HIGH (ref 4.0–10.5)

## 2015-12-13 LAB — COMPREHENSIVE METABOLIC PANEL
ALBUMIN: 3.3 g/dL — AB (ref 3.5–5.0)
ALT: 41 U/L (ref 17–63)
ANION GAP: 11 (ref 5–15)
AST: 35 U/L (ref 15–41)
Alkaline Phosphatase: 404 U/L — ABNORMAL HIGH (ref 38–126)
BILIRUBIN TOTAL: 1.1 mg/dL (ref 0.3–1.2)
BUN: 18 mg/dL (ref 6–20)
CO2: 25 mmol/L (ref 22–32)
Calcium: 9 mg/dL (ref 8.9–10.3)
Chloride: 99 mmol/L — ABNORMAL LOW (ref 101–111)
Creatinine, Ser: 0.99 mg/dL (ref 0.61–1.24)
GFR calc non Af Amer: >60 mL/min (ref >60)
GLUCOSE: 189 mg/dL — AB (ref 65–99)
POTASSIUM: 3.9 mmol/L (ref 3.5–5.1)
Sodium: 135 mmol/L (ref 135–145)
TOTAL PROTEIN: 6.4 g/dL — AB (ref 6.5–8.1)

## 2015-12-13 LAB — APTT: APTT: 26 s (ref 24–37)

## 2015-12-13 MED ORDER — LIDOCAINE HCL 1 % IJ SOLN
INTRAMUSCULAR | Status: AC
  Filled 2015-12-13: qty 20

## 2015-12-13 MED ORDER — SODIUM CHLORIDE 0.9 % IV SOLN
INTRAVENOUS | Status: DC
  Administered 2015-12-13: 08:00:00 via INTRAVENOUS

## 2015-12-13 MED ORDER — HYDROCODONE-ACETAMINOPHEN 5-325 MG PO TABS: 1.0000 | ORAL_TABLET | ORAL | Status: DC | PRN

## 2015-12-13 MED ORDER — PIPERACILLIN-TAZOBACTAM 3.375 G IVPB
3.3750 g | INTRAVENOUS | Status: AC
  Administered 2015-12-13: 3.375 g via INTRAVENOUS
  Filled 2015-12-13 (×2): qty 50

## 2015-12-13 MED ORDER — IOHEXOL 300 MG/ML  SOLN
50.0000 mL | Freq: Once | INTRAMUSCULAR | Status: AC | PRN
  Administered 2015-12-13: 20 mL

## 2015-12-13 MED ORDER — FENTANYL CITRATE (PF) 100 MCG/2ML IJ SOLN
INTRAMUSCULAR | Status: AC | PRN
  Administered 2015-12-13: 25 ug via INTRAVENOUS
  Administered 2015-12-13: 50 ug via INTRAVENOUS
  Administered 2015-12-13: 25 ug via INTRAVENOUS

## 2015-12-13 MED ORDER — MIDAZOLAM HCL 2 MG/2ML IJ SOLN
INTRAMUSCULAR | Status: AC
  Filled 2015-12-13: qty 6

## 2015-12-13 MED ORDER — FENTANYL CITRATE (PF) 100 MCG/2ML IJ SOLN
INTRAMUSCULAR | Status: AC
  Filled 2015-12-13: qty 4

## 2015-12-13 MED ORDER — MIDAZOLAM HCL 2 MG/2ML IJ SOLN
INTRAMUSCULAR | Status: AC | PRN
  Administered 2015-12-13 (×2): 0.5 mg via INTRAVENOUS
  Administered 2015-12-13: 1 mg via INTRAVENOUS

## 2015-12-13 MED ORDER — HEPARIN SOD (PORK) LOCK FLUSH 100 UNIT/ML IV SOLN
500.0000 [IU] | Freq: Once | INTRAVENOUS | Status: DC
  Filled 2015-12-13: qty 5

## 2015-12-13 NOTE — Sedation Documentation (Signed)
Patient denies pain and is resting comfortably.  

## 2015-12-13 NOTE — H&P (Signed)
Chief Complaint: pancreatic cancer Referring Physician:Dr. [name] HPI: Jacob Hardin is an 70 y.o. male well-known to [place] for a int/ext biliary drain that was internalized about 2 weeks.  He has done well with this.  It has leaked only a small amount.  He started chemo last Tuesday and then received a Nulasta injection on Friday.  He has been tired, but otherwise feels about like his normal self.  He presents today for attempted stent placement and removal of drain if possible.  Past Medical History:  Past Medical History  Diagnosis Date  . Diabetes mellitus without complication ([place])   . Hyperlipidemia   . Complication of anesthesia   . PONV (postoperative nausea and vomiting)   . Hypertension     recently weight lost-no meds now-running low.  . Primary pancreatic adenocarcinoma ([place])     mets to liver and duodenum    Past Surgical History:  Past Surgical History  Procedure Laterality Date  . Spine surgery  1994    cervical-bone graft  . Rotator cuff repair Left 2014  . Tonsillectomy    . Hernia repair    . Colonoscopy    . Wisdom tooth extraction    . Eus  11/18/2015    Procedure: UPPER ENDOSCOPIC ULTRASOUND (EUS) LINEAR;  Surgeon: Carol Ada, MD;  Location: [facility] ENDOSCOPY;  Service: Endoscopy;;  . Esophagogastroduodenoscopy N/A 11/18/2015    Procedure: ESOPHAGOGASTRODUODENOSCOPY (EGD);  Surgeon: Carol Ada, MD;  Location: [facility] ENDOSCOPY;  Service: Endoscopy;  Laterality: N/A;  . Esophagogastroduodenoscopy N/A 11/22/2015    Procedure: ESOPHAGOGASTRODUODENOSCOPY (EGD);  Surgeon: Carol Ada, MD;  Location: [facility] ENDOSCOPY;  Service: Endoscopy;  Laterality: N/A;  Uncovered duodenal stent placement.  Fluoroscopy required.  . Duodenal stent placement N/A 11/22/2015    Procedure: DUODENAL STENT PLACEMENT;  Surgeon: Carol Ada, MD;  Location: [facility] ENDOSCOPY;  Service: Endoscopy;  Laterality: N/A;    Family History:  Family History  Problem Relation Age of Onset  .  Diabetes Mother   . Hypertension Mother   . Cancer Father     lung 27  . Hypertension Father   . Cancer Brother     pancreatic   . Diabetes Brother   . Cancer Paternal Uncle     prostate  . Cancer Paternal Grandfather     colon    Social History:  reports that he has never smoked. He has never used smokeless tobacco. He reports that he does not drink alcohol or use illicit drugs.  Allergies:  Allergies  Allergen Reactions  . Codeine Nausea Only    Medications:   Medication List    ASK your doctor about these medications        BD PEN NEEDLE NANO U/F 32G X 4 MM Misc  Generic drug:  Insulin Pen Needle  USE TO INJECT INSULIN TWICE A DAY     CREON 12000 units Cpep capsule  Generic drug:  lipase/protease/amylase  Take 24,000 Units by mouth 3 (three) times daily before meals.     Insulin Detemir 100 UNIT/ML Pen  Commonly known as:  LEVEMIR FLEXTOUCH  Inject 20 Units into the skin daily at 10 pm.     lidocaine-prilocaine cream  Commonly known as:  EMLA  Apply small amount over port area 1 hour prior to treatment and cover with plastic wrap.  DO NOT RUB IN     loperamide 2 MG tablet  Commonly known as:  IMODIUM A-D  Take 2 mg by mouth 4 (four)  times daily as needed for diarrhea or loose stools.     multivitamin tablet  Take 1 tablet by mouth every morning. CentrumSilver     prochlorperazine 10 MG tablet  Commonly known as:  COMPAZINE  Take 1 tablet (10 mg total) by mouth every 6 (six) hours as needed for nausea or vomiting.        Please HPI for pertinent positives, otherwise complete 10 system ROS negative.  Mallampati Score: MD Evaluation Airway: WNL Heart: WNL Abdomen: WNL Chest/ Lungs: WNL ASA  Classification: 3 Mallampati/Airway Score: One  Physical Exam: There were no vitals taken for this visit. There is no weight on file to calculate BMI. General: pleasant, WD, WN white male who is laying in bed in NAD HEENT: head is normocephalic, atraumatic.   Sclera are noninjected.  PERRL.  Ears and nose without any masses or lesions.  Mouth is pink and moist Heart: regular, rate, and rhythm.  Normal s1,s2. No obvious murmurs, gallops, or rubs noted.  Palpable radial and pedal pulses bilaterally Lungs: CTAB, no wheezes, rhonchi, or rales noted.  Respiratory effort nonlabored, PAC in RU chest Abd: soft, NT, ND, +BS, no masses, hernias, or organomegaly, drain is capped off and present in RUQ with minimal bile stain on dressing MS: all 4 extremities are symmetrical with no cyanosis, clubbing, or edema. Skin: warm and dry with no masses, lesions, or rashes Psych: A&Ox3 with an appropriate affect.   Labs: Results for orders placed or performed during the hospital encounter of 12/13/15 (from the past 48 hour(s))  Glucose, capillary     Status: Abnormal   Collection Time: 12/13/15  7:36 AM  Result Value Ref Range   Glucose-Capillary 164 (H) 65 - 99 mg/dL   Comment 1 Notify RN     Imaging: No results found.  Assessment/Plan 1. Pancreatic cancer with biliary obstruction s/p int/ext biliary drain placement -plan today is to removal this drain and place a biliary stent. -WBC is elevated at 18K.  He denies any fevers, chills, dysuria, cough, CP, SOB, etc.  He did receive Nulasta last Friday.  I have spoken to Dr. Vernard Gambles as well as Dr. [name], his oncologist.  His TB and AST,ALT are normal.  It is felt that his elevation of his WBC is secondary to his Nulasta injection.  We will go ahead and proceed with the procedure. -the procedure including risks and complications, including but not limited to, bleeding, infection, injury to adjacent structures, inability to remove drain or place stent, were explained to the patient.  He understands and is agreeable to proceed.  Thank you for this interesting consult.  I greatly enjoyed meeting Jacob Hardin and look forward to participating in their care.  A copy of this report was sent to the requesting  provider on this date.  Electronically Signed: Henreitta Cea 12/13/2015, 8:46 AM   I spent a total of    25 minutes in face to face in clinical consultation, greater than 50% of which was counseling/coordinating care for pancreatic cancer with biliary obstruction, needs stent

## 2015-12-13 NOTE — Procedures (Signed)
Internalize biliary drain with [id number] stent No complication No blood loss. See complete dictation in [facility].

## 2015-12-13 NOTE — Discharge Instructions (Signed)
Biliary Drainage Catheter Placement, Care After °Refer to this sheet in the next few weeks. These instructions provide you with information on caring for yourself after your procedure. Your health care provider may also give you more specific instructions. Your treatment has been planned according to current medical practices, but problems sometimes occur. Call your health care provider if you have any problems or questions after your procedure. °WHAT TO EXPECT AFTER THE PROCEDURE °After your procedure, it is typical to have the following: °· Pain or soreness at the catheter insertion site. °· Drowsiness for several hours after the procedure. °· Some bruising at the catheter insertion site. °· Drainage into the collection bag on the outside of your body, if you have an external drainage catheter. You might see bloody discharge in the bag for the first day or two. This should turn a yellow-green color soon afterward. °HOME CARE INSTRUCTIONS  °· Do not use machinery, drive, or make legal decisions for 24 hours after your procedure. °· Have someone drive you home. °· Resume your usual diet. Avoid alcoholic beverages for 24 hours after your procedure. °· Rest for the remainder of the day. °· Only take over-the-counter or prescription medicines for pain, discomfort, or fever as directed by your health care provider. Do not take aspirin or blood thinners unless directed otherwise. This can make bleeding worse. °· Clean the tube insertion site as directed by your health care provider. °· Take showers, not baths. Avoid pools and hot tubs. Before showering, cover the area with plastic wrap and tape the edges of the plastic wrap to your skin. This is done to keep your skin dry. °· Keep the skin around the insertion site dry. If the area gets wet, dry the skin completely. °· Keep all follow-up appointments. °SEEK MEDICAL CARE IF:  °· Your pain gets worse and is not relieved with pain medicines after an initial  improvement. °· You have any questions about your tube. °· Your skin breaks down around the tube. °· You have a fever. °· You have chills. °SEEK IMMEDIATE MEDICAL CARE IF:  °· Your redness, soreness, or swelling at the tube insertion site gets worse despite good cleaning. °· You have leakage of bile around the tube. °· Your tube becomes blocked or clogged. °· Your catheter is dislodged or comes out. °  °This information is not intended to replace advice given to you by your health care provider. Make sure you discuss any questions you have with your health care provider. °  °Document Released: 05/22/2004 Document Revised: 10/13/2013 Document Reviewed: 06/15/2013 °Elsevier Interactive Patient Education ©2016 [employer] °Moderate Conscious Sedation, Adult, Care After °Refer to this sheet in the next few weeks. These instructions provide you with information on caring for yourself after your procedure. Your health care provider may also give you more specific instructions. Your treatment has been planned according to current medical practices, but problems sometimes occur. Call your health care provider if you have any problems or questions after your procedure. °WHAT TO EXPECT AFTER THE PROCEDURE  °After your procedure: °· You may feel sleepy, clumsy, and have poor balance for several hours. °· Vomiting may occur if you eat too soon after the procedure. °HOME CARE INSTRUCTIONS °· Do not participate in any activities where you could become injured for at least 24 hours. Do not: °¨ Drive. °¨ Swim. °¨ Ride a bicycle. °¨ Operate heavy machinery. °¨ Cook. °¨ Use power tools. °¨ Climb ladders. °¨ Work from a high place. °·   Do not make important decisions or sign legal documents until you are improved. °· If you vomit, drink water, juice, or soup when you can drink without vomiting. Make sure you have little or no nausea before eating solid foods. °· Only take over-the-counter or prescription medicines for pain,  discomfort, or fever as directed by your health care provider. °· Make sure you and your family fully understand everything about the medicines given to you, including what side effects may occur. °· You should not drink alcohol, take sleeping pills, or take medicines that cause drowsiness for at least 24 hours. °· If you smoke, do not smoke without supervision. °· If you are feeling better, you may resume normal activities 24 hours after you were sedated. °· Keep all appointments with your health care provider. °SEEK MEDICAL CARE IF: °· Your skin is pale or bluish in color. °· You continue to feel nauseous or vomit. °· Your pain is getting worse and is not helped by medicine. °· You have bleeding or swelling. °· You are still sleepy or feeling clumsy after 24 hours. °SEEK IMMEDIATE MEDICAL CARE IF: °· You develop a rash. °· You have difficulty breathing. °· You develop any type of allergic problem. °· You have a fever. °MAKE SURE YOU: °· Understand these instructions. °· Will watch your condition. °· Will get help right away if you are not doing well or get worse. °  °This information is not intended to replace advice given to you by your health care provider. Make sure you discuss any questions you have with your health care provider. °  °Document Released: 07/29/2013 Document Revised: 10/29/2014 Document Reviewed: 07/29/2013 °Elsevier Interactive Patient Education ©2016 [employer] Inc. ° °

## 2015-12-18 ENCOUNTER — Other Ambulatory Visit: Payer: Self-pay | Admitting: Oncology

## 2015-12-21 ENCOUNTER — Ambulatory Visit (HOSPITAL_BASED_OUTPATIENT_CLINIC_OR_DEPARTMENT_OTHER): Payer: Medicare Other

## 2015-12-21 ENCOUNTER — Other Ambulatory Visit (HOSPITAL_BASED_OUTPATIENT_CLINIC_OR_DEPARTMENT_OTHER): Payer: Medicare Other

## 2015-12-21 ENCOUNTER — Ambulatory Visit: Payer: Medicare Other | Admitting: Nutrition

## 2015-12-21 ENCOUNTER — Telehealth: Payer: Self-pay | Admitting: *Deleted

## 2015-12-21 ENCOUNTER — Ambulatory Visit (HOSPITAL_BASED_OUTPATIENT_CLINIC_OR_DEPARTMENT_OTHER): Payer: Medicare Other | Admitting: Nurse Practitioner

## 2015-12-21 ENCOUNTER — Ambulatory Visit: Payer: Medicare Other

## 2015-12-21 ENCOUNTER — Other Ambulatory Visit: Payer: Medicare Other

## 2015-12-21 VITALS — BP 126/68 | HR 67 | Temp 98.8°F | Resp 18 | Ht 72.0 in | Wt 161.1 lb

## 2015-12-21 DIAGNOSIS — C787 Secondary malignant neoplasm of liver and intrahepatic bile duct: Secondary | ICD-10-CM

## 2015-12-21 DIAGNOSIS — C250 Malignant neoplasm of head of pancreas: Secondary | ICD-10-CM

## 2015-12-21 DIAGNOSIS — Z95828 Presence of other vascular implants and grafts: Secondary | ICD-10-CM

## 2015-12-21 DIAGNOSIS — Z5111 Encounter for antineoplastic chemotherapy: Secondary | ICD-10-CM | POA: Diagnosis not present

## 2015-12-21 LAB — COMPREHENSIVE METABOLIC PANEL
ALBUMIN: 3.2 g/dL — AB (ref 3.5–5.0)
ALK PHOS: 319 U/L — AB (ref 40–150)
ALT: 41 U/L (ref 0–55)
ANION GAP: 9 meq/L (ref 3–11)
AST: 33 U/L (ref 5–34)
BILIRUBIN TOTAL: 0.65 mg/dL (ref 0.20–1.20)
BUN: 9.7 mg/dL (ref 7.0–26.0)
CO2: 26 meq/L (ref 22–29)
Calcium: 8.9 mg/dL (ref 8.4–10.4)
Chloride: 104 mEq/L (ref 98–109)
Creatinine: 1 mg/dL (ref 0.7–1.3)
EGFR: 81 mL/min/{1.73_m2} — AB (ref >90)
GLUCOSE: 197 mg/dL — AB (ref 70–140)
POTASSIUM: 4 meq/L (ref 3.5–5.1)
SODIUM: 139 meq/L (ref 136–145)
TOTAL PROTEIN: 6.1 g/dL — AB (ref 6.4–8.3)

## 2015-12-21 LAB — CBC WITH DIFFERENTIAL/PLATELET
BASO%: 0.6 % (ref 0.0–2.0)
BASOS ABS: 0.1 10*3/uL (ref 0.0–0.1)
EOS ABS: 0.4 10*3/uL (ref 0.0–0.5)
EOS%: 3 % (ref 0.0–7.0)
HCT: 30.9 % — ABNORMAL LOW (ref 38.4–49.9)
HGB: 10.2 g/dL — ABNORMAL LOW (ref 13.0–17.1)
LYMPH%: 11.7 % — AB (ref 14.0–49.0)
MCH: 28.7 pg (ref 27.2–33.4)
MCHC: 33 g/dL (ref 32.0–36.0)
MCV: 86.8 fL (ref 79.3–98.0)
MONO#: 1.1 10*3/uL — AB (ref 0.1–0.9)
MONO%: 7.4 % (ref 0.0–14.0)
NEUT%: 77.3 % — AB (ref 39.0–75.0)
NEUTROS ABS: 10.9 10*3/uL — AB (ref 1.5–6.5)
PLATELETS: 232 10*3/uL (ref 140–400)
RBC: 3.56 10*6/uL — AB (ref 4.20–5.82)
RDW: 15.2 % — ABNORMAL HIGH (ref 11.0–14.6)
WBC: 14.1 10*3/uL — ABNORMAL HIGH (ref 4.0–10.3)
lymph#: 1.7 10*3/uL (ref 0.9–3.3)

## 2015-12-21 MED ORDER — SODIUM CHLORIDE 0.9 % IV SOLN
Freq: Once | INTRAVENOUS | Status: AC
  Administered 2015-12-21: 11:00:00 via INTRAVENOUS
  Filled 2015-12-21: qty 5

## 2015-12-21 MED ORDER — SODIUM CHLORIDE 0.9 % IV SOLN
2400.0000 mg/m2 | INTRAVENOUS | Status: DC
  Administered 2015-12-21: 4700 mg via INTRAVENOUS
  Filled 2015-12-21: qty 94

## 2015-12-21 MED ORDER — DEXTROSE 5 % IV SOLN
400.0000 mg/m2 | Freq: Once | INTRAVENOUS | Status: AC
  Administered 2015-12-21: 780 mg via INTRAVENOUS
  Filled 2015-12-21: qty 39

## 2015-12-21 MED ORDER — ATROPINE SULFATE 1 MG/ML IJ SOLN
0.5000 mg | Freq: Once | INTRAMUSCULAR | Status: AC | PRN
  Administered 2015-12-21: 0.5 mg via INTRAVENOUS

## 2015-12-21 MED ORDER — DEXTROSE 5 % IV SOLN
Freq: Once | INTRAVENOUS | Status: AC
  Administered 2015-12-21: 12:00:00 via INTRAVENOUS

## 2015-12-21 MED ORDER — BACLOFEN 10 MG PO TABS: 5.0000 mg | ORAL_TABLET | Freq: Three times a day (TID) | ORAL | Status: DC | PRN

## 2015-12-21 MED ORDER — ATROPINE SULFATE 1 MG/ML IJ SOLN
INTRAMUSCULAR | Status: AC
  Filled 2015-12-21: qty 1

## 2015-12-21 MED ORDER — PALONOSETRON HCL INJECTION 0.25 MG/5ML
INTRAVENOUS | Status: AC
  Filled 2015-12-21: qty 5

## 2015-12-21 MED ORDER — SODIUM CHLORIDE 0.9% FLUSH
10.0000 mL | INTRAVENOUS | Status: DC | PRN
  Administered 2015-12-21: 10 mL via INTRAVENOUS
  Filled 2015-12-21: qty 10

## 2015-12-21 MED ORDER — PANCRELIPASE (LIP-PROT-AMYL) 12000-38000 UNITS PO CPEP
ORAL_CAPSULE | ORAL | Status: DC
Start: 2015-12-21 — End: 2016-04-27

## 2015-12-21 MED ORDER — OXALIPLATIN CHEMO INJECTION 100 MG/20ML
85.0000 mg/m2 | Freq: Once | INTRAVENOUS | Status: AC
  Administered 2015-12-21: 165 mg via INTRAVENOUS
  Filled 2015-12-21: qty 23

## 2015-12-21 MED ORDER — PALONOSETRON HCL INJECTION 0.25 MG/5ML
0.2500 mg | Freq: Once | INTRAVENOUS | Status: AC
  Administered 2015-12-21: 0.25 mg via INTRAVENOUS

## 2015-12-21 MED ORDER — IRINOTECAN HCL CHEMO INJECTION 100 MG/5ML
174.0000 mg/m2 | Freq: Once | INTRAVENOUS | Status: AC
  Administered 2015-12-21: 340 mg via INTRAVENOUS
  Filled 2015-12-21: qty 5.67

## 2015-12-21 NOTE — Telephone Encounter (Signed)
Per staff message and POF I have scheduled appts. Advised scheduler of appts. JMW  

## 2015-12-21 NOTE — Telephone Encounter (Signed)
Call from Amy, Infusion RN: Pt is requesting something for hiccups. Had hiccups off and on for 2 days after last treatment. Reviewed with [name], NP: Order received for Baclofen. Hiccups usually go away on their own, caution pt re: drowsiness.  Teaching reviewed with pt and wife. Rx sent to pharmacy.

## 2015-12-21 NOTE — Patient Instructions (Signed)

## 2015-12-21 NOTE — Patient Instructions (Signed)
[facility] Discharge Instructions for Patients Receiving Chemotherapy  Today you received the following chemotherapy agents OXaliplatin, Leucovorin, Irinotecan and Fluorouracil.  To help prevent nausea and vomiting after your treatment, we encourage you to take your nausea medication as directed.   If you develop nausea and vomiting that is not controlled by your nausea medication, call the clinic.   BELOW ARE SYMPTOMS THAT SHOULD BE REPORTED IMMEDIATELY:  *FEVER GREATER THAN 100.5 F  *CHILLS WITH OR WITHOUT FEVER  NAUSEA AND VOMITING THAT IS NOT CONTROLLED WITH YOUR NAUSEA MEDICATION  *UNUSUAL SHORTNESS OF BREATH  *UNUSUAL BRUISING OR BLEEDING  TENDERNESS IN MOUTH AND THROAT WITH OR WITHOUT PRESENCE OF ULCERS  *URINARY PROBLEMS  *BOWEL PROBLEMS  UNUSUAL RASH Items with * indicate a potential emergency and should be followed up as soon as possible.  Feel free to call the clinic you have any questions or concerns. The clinic phone number is [phone].  Please show the [place] at check-in to the Emergency Department and triage nurse.

## 2015-12-21 NOTE — Progress Notes (Signed)
  [place] OFFICE PROGRESS NOTE   Diagnosis:  Pancreas cancer  INTERVAL HISTORY:   [name] returns as scheduled. He completed cycle 1 FOLFIRINOX 12/06/2015. He denies nausea/vomiting. No mouth sores. No diarrhea. He had mild constipation. Cold sensitivity lasted 4-5 days. No bone pain following Neulasta.  Objective:  Vital signs in last 24 hours:  Blood pressure 126/68, pulse 67, temperature 98.8 F (37.1 C), temperature source Oral, resp. rate 18, height 6' (1.829 m), weight 161 lb 1.6 oz (73.074 kg), SpO2 98 %.    HEENT: No thrush or ulcers. Resp: Lungs clear bilaterally. Cardio: Regular rate and rhythm. GI: Abdomen soft and nontender. No hepatomegaly. No mass. Vascular: No leg edema. Calves soft and nontender. Port-A-Cath without erythema.    Lab Results:  Lab Results  Component Value Date   WBC 14.1* 12/21/2015   HGB 10.2* 12/21/2015   HCT 30.9* 12/21/2015   MCV 86.8 12/21/2015   PLT 232 12/21/2015   NEUTROABS 10.9* 12/21/2015    Imaging:  No results found.  Medications: I have reviewed the patient's current medications.  Assessment/Plan: 1. Pancreas cancer, stage IV, pancreas head mass, status post an EUS biopsy 11/18/2015 confirming adenocarcinoma  Upper endoscopy 11/18/2015 confirmed duodenal invasion/obstruction with a biopsy confirming adenocarcinoma  Placement of a duodenal stent 11/22/2015  MRI abdomen 11/15/2015 revealed a pancreas head mass and liver metastases  Cycle 1 FOLFIRINOX 12/06/2015  Cycle 2 FOLFIRINOX 12/21/2015  2. Diabetes  3. Anorexia/weight loss secondary to #1  4. Obstructive jaundice secondary to #1-status post placement of a percutaneous internal/external biliary drain 11/25/2015; biliary drainage catheter capped 12/02/2015; biliary drain internalized 12/13/2015  5. Port-A-Cath placement 11/30/2015 interventional radiology    Disposition: Jacob Hardin appears stable. He has completed 1  cycle of FOLFIRINOX. Plan to proceed with cycle 2 today as scheduled. He will return for a follow-up visit and cycle 3 in 2 weeks.  He will contact the office in the interim with any problems.  [name] ANP/GNP-BC   12/21/2015  9:56 AM

## 2015-12-21 NOTE — Progress Notes (Signed)
Nutrition follow-up completed with patient diagnosed with stage IV pancreas cancer. Patient reports he experienced taste alterations 3-4 days after treatment. He also reported constipation. Weight decreased and documented as 161.1 pounds March 1 decreased from 165.1 pounds February 14. Patient reports he drinks one premier protein daily providing additional 30 g of protein.  Nutrition diagnosis: Unintended weight loss continues.  Intervention:  Educated patient on strategies for improving taste alterations and provided fact sheet Reviewed suggestions for minimizing constipation. Discussed appropriate snacks that are higher in calories, as well as high in protein, moderate in carbohydrates. Provided recipes for nonfortified milk and encouraged patient to consume homemade smoothies. Questions were answered.  Teach back method used.  Monitoring, evaluation, and goals: Patient will increase oral intake to minimize further weight loss.  Next visit: Wednesday, March 15, during treatment.  **Disclaimer: This note was dictated with voice recognition software. Similar sounding words can inadvertently be transcribed and this note may contain transcription errors which may not have been corrected upon publication of note.**

## 2015-12-23 ENCOUNTER — Ambulatory Visit: Payer: Medicare Other

## 2015-12-23 ENCOUNTER — Ambulatory Visit (HOSPITAL_BASED_OUTPATIENT_CLINIC_OR_DEPARTMENT_OTHER): Payer: Medicare Other

## 2015-12-23 DIAGNOSIS — Z5189 Encounter for other specified aftercare: Secondary | ICD-10-CM

## 2015-12-23 DIAGNOSIS — Z452 Encounter for adjustment and management of vascular access device: Secondary | ICD-10-CM | POA: Diagnosis not present

## 2015-12-23 DIAGNOSIS — C250 Malignant neoplasm of head of pancreas: Secondary | ICD-10-CM | POA: Diagnosis not present

## 2015-12-23 MED ORDER — SODIUM CHLORIDE 0.9% FLUSH
10.0000 mL | INTRAVENOUS | Status: DC | PRN
  Administered 2015-12-23: 10 mL
  Filled 2015-12-23: qty 10

## 2015-12-23 MED ORDER — PEGFILGRASTIM INJECTION 6 MG/0.6ML ~~LOC~~
6.0000 mg | PREFILLED_SYRINGE | Freq: Once | SUBCUTANEOUS | Status: AC
  Administered 2015-12-23: 6 mg via SUBCUTANEOUS
  Filled 2015-12-23: qty 0.6

## 2015-12-23 MED ORDER — HEPARIN SOD (PORK) LOCK FLUSH 100 UNIT/ML IV SOLN
500.0000 [IU] | Freq: Once | INTRAVENOUS | Status: AC | PRN
  Administered 2015-12-23: 500 [IU]
  Filled 2015-12-23: qty 5

## 2015-12-23 NOTE — Progress Notes (Signed)
Neulasta injection given by infusion nurse after home infusion pump disconnected 

## 2015-12-23 NOTE — Patient Instructions (Signed)
Implanted Port Home Guide An implanted port is a type of central line that is placed under the skin. Central lines are used to provide IV access when treatment or nutrition needs to be given through a person's veins. Implanted ports are used for long-term IV access. An implanted port may be placed because:   You need IV medicine that would be irritating to the small veins in your hands or arms.   You need long-term IV medicines, such as antibiotics.   You need IV nutrition for a long period.   You need frequent blood draws for lab tests.   You need dialysis.  Implanted ports are usually placed in the chest area, but they can also be placed in the upper arm, the abdomen, or the leg. An implanted port has two main parts:   Reservoir. The reservoir is round and will appear as a small, raised area under your skin. The reservoir is the part where a needle is inserted to give medicines or draw blood.   Catheter. The catheter is a thin, flexible tube that extends from the reservoir. The catheter is placed into a large vein. Medicine that is inserted into the reservoir goes into the catheter and then into the vein.  HOW WILL I CARE FOR MY INCISION SITE? Do not get the incision site wet. Bathe or shower as directed by your health care provider.  HOW IS MY PORT ACCESSED? Special steps must be taken to access the port:   Before the port is accessed, a numbing cream can be placed on the skin. This helps numb the skin over the port site.   Your health care provider uses a sterile technique to access the port.  Your health care provider must put on a mask and sterile gloves.  The skin over your port is cleaned carefully with an antiseptic and allowed to dry.  The port is gently pinched between sterile gloves, and a needle is inserted into the port.  Only "non-coring" port needles should be used to access the port. Once the port is accessed, a blood return should be checked. This helps  ensure that the port is in the vein and is not clogged.   If your port needs to remain accessed for a constant infusion, a clear (transparent) bandage will be placed over the needle site. The bandage and needle will need to be changed every week, or as directed by your health care provider.   Keep the bandage covering the needle clean and dry. Do not get it wet. Follow your health care provider's instructions on how to take a shower or bath while the port is accessed.   If your port does not need to stay accessed, no bandage is needed over the port.  WHAT IS FLUSHING? Flushing helps keep the port from getting clogged. Follow your health care provider's instructions on how and when to flush the port. Ports are usually flushed with saline solution or a medicine called heparin. The need for flushing will depend on how the port is used.   If the port is used for intermittent medicines or blood draws, the port will need to be flushed:   After medicines have been given.   After blood has been drawn.   As part of routine maintenance.   If a constant infusion is running, the port may not need to be flushed.  HOW LONG WILL MY PORT STAY IMPLANTED? The port can stay in for as long as your health care   provider thinks it is needed. When it is time for the port to come out, surgery will be done to remove it. The procedure is similar to the one performed when the port was put in.  WHEN SHOULD I SEEK IMMEDIATE MEDICAL CARE? When you have an implanted port, you should seek immediate medical care if:   You notice a bad smell coming from the incision site.   You have swelling, redness, or drainage at the incision site.   You have more swelling or pain at the port site or the surrounding area.   You have a fever that is not controlled with medicine.   This information is not intended to replace advice given to you by your health care provider. Make sure you discuss any questions you have with  your health care provider.   Document Released: 10/08/2005 Document Revised: 07/29/2013 Document Reviewed: 06/15/2013 Elsevier Interactive Patient Education 2016 [employer] Pegfilgrastim injection What is this medicine? PEGFILGRASTIM (PEG fil gra stim) is a long-acting granulocyte colony-stimulating factor that stimulates the growth of neutrophils, a type of white blood cell important in the body's fight against infection. It is used to reduce the incidence of fever and infection in patients with certain types of cancer who are receiving chemotherapy that affects the bone marrow, and to increase survival after being exposed to high doses of radiation. This medicine may be used for other purposes; ask your health care provider or pharmacist if you have questions. What should I tell my health care provider before I take this medicine? They need to know if you have any of these conditions: -kidney disease -latex allergy -ongoing radiation therapy -sickle cell disease -skin reactions to acrylic adhesives (On-Body Injector only) -an unusual or allergic reaction to pegfilgrastim, filgrastim, other medicines, foods, dyes, or preservatives -pregnant or trying to get pregnant -breast-feeding How should I use this medicine? This medicine is for injection under the skin. If you get this medicine at home, you will be taught how to prepare and give the pre-filled syringe or how to use the On-body Injector. Refer to the patient Instructions for Use for detailed instructions. Use exactly as directed. Take your medicine at regular intervals. Do not take your medicine more often than directed. It is important that you put your used needles and syringes in a special sharps container. Do not put them in a trash can. If you do not have a sharps container, call your pharmacist or healthcare provider to get one. Talk to your pediatrician regarding the use of this medicine in children. While this drug may be  prescribed for selected conditions, precautions do apply. Overdosage: If you think you have taken too much of this medicine contact a poison control center or emergency room at once. NOTE: This medicine is only for you. Do not share this medicine with others. What if I miss a dose? It is important not to miss your dose. Call your doctor or health care professional if you miss your dose. If you miss a dose due to an On-body Injector failure or leakage, a new dose should be administered as soon as possible using a single prefilled syringe for manual use. What may interact with this medicine? Interactions have not been studied. Give your health care provider a list of all the medicines, herbs, non-prescription drugs, or dietary supplements you use. Also tell them if you smoke, drink alcohol, or use illegal drugs. Some items may interact with your medicine. This list may not describe all possible   interactions. Give your health care provider a list of all the medicines, herbs, non-prescription drugs, or dietary supplements you use. Also tell them if you smoke, drink alcohol, or use illegal drugs. Some items may interact with your medicine. What should I watch for while using this medicine? You may need blood work done while you are taking this medicine. If you are going to need a MRI, CT scan, or other procedure, tell your doctor that you are using this medicine (On-Body Injector only). What side effects may I notice from receiving this medicine? Side effects that you should report to your doctor or health care professional as soon as possible: -allergic reactions like skin rash, itching or hives, swelling of the face, lips, or tongue -dizziness -fever -pain, redness, or irritation at site where injected -pinpoint red spots on the skin -red or dark-brown urine -shortness of breath or breathing problems -stomach or side pain, or pain at the shoulder -swelling -tiredness -trouble passing urine or  change in the amount of urine Side effects that usually do not require medical attention (report to your doctor or health care professional if they continue or are bothersome): -bone pain -muscle pain This list may not describe all possible side effects. Call your doctor for medical advice about side effects. You may report side effects to FDA at 1-800-FDA-1088. Where should I keep my medicine? Keep out of the reach of children. Store pre-filled syringes in a refrigerator between 2 and 8 degrees C (36 and 46 degrees F). Do not freeze. Keep in carton to protect from light. Throw away this medicine if it is left out of the refrigerator for more than 48 hours. Throw away any unused medicine after the expiration date. NOTE: This sheet is a summary. It may not cover all possible information. If you have questions about this medicine, talk to your doctor, pharmacist, or health care provider.    2016, Elsevier/Gold Standard. (2014-10-28 14:30:14) Pegfilgrastim injection What is this medicine? PEGFILGRASTIM (PEG fil gra stim) is a long-acting granulocyte colony-stimulating factor that stimulates the growth of neutrophils, a type of white blood cell important in the body's fight against infection. It is used to reduce the incidence of fever and infection in patients with certain types of cancer who are receiving chemotherapy that affects the bone marrow, and to increase survival after being exposed to high doses of radiation. This medicine may be used for other purposes; ask your health care provider or pharmacist if you have questions. What should I tell my health care provider before I take this medicine? They need to know if you have any of these conditions: -kidney disease -latex allergy -ongoing radiation therapy -sickle cell disease -skin reactions to acrylic adhesives (On-Body Injector only) -an unusual or allergic reaction to pegfilgrastim, filgrastim, other medicines, foods, dyes, or  preservatives -pregnant or trying to get pregnant -breast-feeding How should I use this medicine? This medicine is for injection under the skin. If you get this medicine at home, you will be taught how to prepare and give the pre-filled syringe or how to use the On-body Injector. Refer to the patient Instructions for Use for detailed instructions. Use exactly as directed. Take your medicine at regular intervals. Do not take your medicine more often than directed. It is important that you put your used needles and syringes in a special sharps container. Do not put them in a trash can. If you do not have a sharps container, call your pharmacist or healthcare provider to get one.  Talk to your pediatrician regarding the use of this medicine in children. While this drug may be prescribed for selected conditions, precautions do apply. Overdosage: If you think you have taken too much of this medicine contact a poison control center or emergency room at once. NOTE: This medicine is only for you. Do not share this medicine with others. What if I miss a dose? It is important not to miss your dose. Call your doctor or health care professional if you miss your dose. If you miss a dose due to an On-body Injector failure or leakage, a new dose should be administered as soon as possible using a single prefilled syringe for manual use. What may interact with this medicine? Interactions have not been studied. Give your health care provider a list of all the medicines, herbs, non-prescription drugs, or dietary supplements you use. Also tell them if you smoke, drink alcohol, or use illegal drugs. Some items may interact with your medicine. This list may not describe all possible interactions. Give your health care provider a list of all the medicines, herbs, non-prescription drugs, or dietary supplements you use. Also tell them if you smoke, drink alcohol, or use illegal drugs. Some items may interact with your  medicine. What should I watch for while using this medicine? You may need blood work done while you are taking this medicine. If you are going to need a MRI, CT scan, or other procedure, tell your doctor that you are using this medicine (On-Body Injector only). What side effects may I notice from receiving this medicine? Side effects that you should report to your doctor or health care professional as soon as possible: -allergic reactions like skin rash, itching or hives, swelling of the face, lips, or tongue -dizziness -fever -pain, redness, or irritation at site where injected -pinpoint red spots on the skin -red or dark-brown urine -shortness of breath or breathing problems -stomach or side pain, or pain at the shoulder -swelling -tiredness -trouble passing urine or change in the amount of urine Side effects that usually do not require medical attention (report to your doctor or health care professional if they continue or are bothersome): -bone pain -muscle pain This list may not describe all possible side effects. Call your doctor for medical advice about side effects. You may report side effects to FDA at 1-800-FDA-1088. Where should I keep my medicine? Keep out of the reach of children. Store pre-filled syringes in a refrigerator between 2 and 8 degrees C (36 and 46 degrees F). Do not freeze. Keep in carton to protect from light. Throw away this medicine if it is left out of the refrigerator for more than 48 hours. Throw away any unused medicine after the expiration date. NOTE: This sheet is a summary. It may not cover all possible information. If you have questions about this medicine, talk to your doctor, pharmacist, or health care provider.    2016, Elsevier/Gold Standard. (2014-10-28 14:30:14)

## 2015-12-30 ENCOUNTER — Other Ambulatory Visit: Payer: Self-pay | Admitting: Oncology

## 2016-01-04 ENCOUNTER — Other Ambulatory Visit: Payer: Self-pay | Admitting: Nurse Practitioner

## 2016-01-04 ENCOUNTER — Other Ambulatory Visit (HOSPITAL_BASED_OUTPATIENT_CLINIC_OR_DEPARTMENT_OTHER): Payer: Medicare Other

## 2016-01-04 ENCOUNTER — Ambulatory Visit (HOSPITAL_BASED_OUTPATIENT_CLINIC_OR_DEPARTMENT_OTHER): Payer: Medicare Other | Admitting: Nurse Practitioner

## 2016-01-04 ENCOUNTER — Ambulatory Visit (HOSPITAL_BASED_OUTPATIENT_CLINIC_OR_DEPARTMENT_OTHER): Payer: Medicare Other

## 2016-01-04 ENCOUNTER — Telehealth: Payer: Self-pay | Admitting: Oncology

## 2016-01-04 ENCOUNTER — Ambulatory Visit: Payer: Medicare Other | Admitting: Nutrition

## 2016-01-04 VITALS — BP 132/69 | HR 68 | Temp 98.4°F | Resp 18 | Ht 72.0 in | Wt 159.5 lb

## 2016-01-04 DIAGNOSIS — C250 Malignant neoplasm of head of pancreas: Secondary | ICD-10-CM

## 2016-01-04 DIAGNOSIS — Z5111 Encounter for antineoplastic chemotherapy: Secondary | ICD-10-CM | POA: Diagnosis not present

## 2016-01-04 DIAGNOSIS — C787 Secondary malignant neoplasm of liver and intrahepatic bile duct: Secondary | ICD-10-CM

## 2016-01-04 DIAGNOSIS — Z452 Encounter for adjustment and management of vascular access device: Secondary | ICD-10-CM | POA: Diagnosis not present

## 2016-01-04 DIAGNOSIS — Z95828 Presence of other vascular implants and grafts: Secondary | ICD-10-CM

## 2016-01-04 DIAGNOSIS — E119 Type 2 diabetes mellitus without complications: Secondary | ICD-10-CM

## 2016-01-04 LAB — COMPREHENSIVE METABOLIC PANEL
ALT: 62 U/L — ABNORMAL HIGH (ref 0–55)
AST: 53 U/L — AB (ref 5–34)
Albumin: 2.8 g/dL — ABNORMAL LOW (ref 3.5–5.0)
Alkaline Phosphatase: 518 U/L — ABNORMAL HIGH (ref 40–150)
Anion Gap: 10 mEq/L (ref 3–11)
BUN: 12.1 mg/dL (ref 7.0–26.0)
CHLORIDE: 103 meq/L (ref 98–109)
CO2: 27 meq/L (ref 22–29)
Calcium: 9.1 mg/dL (ref 8.4–10.4)
Creatinine: 1 mg/dL (ref 0.7–1.3)
EGFR: 80 mL/min/{1.73_m2} — ABNORMAL LOW (ref >90)
GLUCOSE: 203 mg/dL — AB (ref 70–140)
POTASSIUM: 4.3 meq/L (ref 3.5–5.1)
SODIUM: 140 meq/L (ref 136–145)
Total Bilirubin: 0.46 mg/dL (ref 0.20–1.20)
Total Protein: 6.3 g/dL — ABNORMAL LOW (ref 6.4–8.3)

## 2016-01-04 LAB — CBC WITH DIFFERENTIAL/PLATELET
BASO%: 0.8 % (ref 0.0–2.0)
Basophils Absolute: 0.1 10*3/uL (ref 0.0–0.1)
EOS%: 0.5 % (ref 0.0–7.0)
Eosinophils Absolute: 0.1 10*3/uL (ref 0.0–0.5)
HEMATOCRIT: 31 % — AB (ref 38.4–49.9)
HEMOGLOBIN: 9.9 g/dL — AB (ref 13.0–17.1)
LYMPH#: 1 10*3/uL (ref 0.9–3.3)
LYMPH%: 8.1 % — ABNORMAL LOW (ref 14.0–49.0)
MCH: 27.8 pg (ref 27.2–33.4)
MCHC: 32 g/dL (ref 32.0–36.0)
MCV: 86.8 fL (ref 79.3–98.0)
MONO#: 1 10*3/uL — ABNORMAL HIGH (ref 0.1–0.9)
MONO%: 8 % (ref 0.0–14.0)
NEUT%: 82.6 % — ABNORMAL HIGH (ref 39.0–75.0)
NEUTROS ABS: 9.8 10*3/uL — AB (ref 1.5–6.5)
Platelets: 297 10*3/uL (ref 140–400)
RBC: 3.57 10*6/uL — AB (ref 4.20–5.82)
RDW: 15.6 % — AB (ref 11.0–14.6)
WBC: 11.9 10*3/uL — ABNORMAL HIGH (ref 4.0–10.3)

## 2016-01-04 MED ORDER — LORAZEPAM 2 MG/ML IJ SOLN
INTRAMUSCULAR | Status: AC
  Filled 2016-01-04: qty 1

## 2016-01-04 MED ORDER — IRINOTECAN HCL CHEMO INJECTION 100 MG/5ML
174.0000 mg/m2 | Freq: Once | INTRAVENOUS | Status: AC
  Administered 2016-01-04: 340 mg via INTRAVENOUS
  Filled 2016-01-04: qty 5.67

## 2016-01-04 MED ORDER — SODIUM CHLORIDE 0.9% FLUSH
10.0000 mL | INTRAVENOUS | Status: AC | PRN
Start: 2016-01-04 — End: ?
  Administered 2016-01-04: 10 mL via INTRAVENOUS
  Filled 2016-01-04: qty 10

## 2016-01-04 MED ORDER — ALTEPLASE 2 MG IJ SOLR
2.0000 mg | Freq: Once | INTRAMUSCULAR | Status: AC | PRN
  Administered 2016-01-04: 2 mg
  Filled 2016-01-04: qty 2

## 2016-01-04 MED ORDER — SODIUM CHLORIDE 0.9 % IV SOLN
2400.0000 mg/m2 | INTRAVENOUS | Status: DC
  Administered 2016-01-04: 4700 mg via INTRAVENOUS
  Filled 2016-01-04: qty 94

## 2016-01-04 MED ORDER — PALONOSETRON HCL INJECTION 0.25 MG/5ML
0.2500 mg | Freq: Once | INTRAVENOUS | Status: AC
  Administered 2016-01-04: 0.25 mg via INTRAVENOUS

## 2016-01-04 MED ORDER — LEUCOVORIN CALCIUM INJECTION 350 MG
400.0000 mg/m2 | Freq: Once | INTRAVENOUS | Status: AC
  Administered 2016-01-04: 780 mg via INTRAVENOUS
  Filled 2016-01-04: qty 39

## 2016-01-04 MED ORDER — LORAZEPAM 2 MG/ML IJ SOLN
0.5000 mg | Freq: Once | INTRAMUSCULAR | Status: AC
  Administered 2016-01-04: 0.5 mg via INTRAVENOUS

## 2016-01-04 MED ORDER — DEXTROSE 5 % IV SOLN
Freq: Once | INTRAVENOUS | Status: AC
  Administered 2016-01-04: 11:00:00 via INTRAVENOUS

## 2016-01-04 MED ORDER — FOSAPREPITANT DIMEGLUMINE INJECTION 150 MG
Freq: Once | INTRAVENOUS | Status: AC
  Administered 2016-01-04: 11:00:00 via INTRAVENOUS
  Filled 2016-01-04: qty 5

## 2016-01-04 MED ORDER — OXALIPLATIN CHEMO INJECTION 100 MG/20ML
85.0000 mg/m2 | Freq: Once | INTRAVENOUS | Status: AC
  Administered 2016-01-04: 165 mg via INTRAVENOUS
  Filled 2016-01-04: qty 20

## 2016-01-04 MED ORDER — PALONOSETRON HCL INJECTION 0.25 MG/5ML
INTRAVENOUS | Status: AC
  Filled 2016-01-04: qty 5

## 2016-01-04 NOTE — Progress Notes (Signed)
At 1030, brisk blood return noted from Port-A-Cath site. Free flows to NS line. Denies pain or discomfort when Port flushed, no swelling noted.

## 2016-01-04 NOTE — Telephone Encounter (Signed)
Gave adn printed appt sched and avs for pt for March and April  °

## 2016-01-04 NOTE — Progress Notes (Signed)
  [facility] OFFICE PROGRESS NOTE   Diagnosis:  Pancreas cancer  INTERVAL HISTORY:   Jacob Hardin returns as scheduled. He completed cycle 2 FOLFIRINOX 12/21/2015. He denies nausea/vomiting. No mouth sores. No diarrhea. Cold sensitivity lasted 6 or 7 days. No persistent neuropathy symptoms. Between days 4 and 6 he developed an intermittent "cramp" at the end of the sternum. The cramp was relieved with stretching. He denies dysphagia.  Objective:  Vital signs in last 24 hours:  Blood pressure 132/69, pulse 68, temperature 98.4 F (36.9 C), temperature source Oral, resp. rate 18, height 6' (1.829 m), weight 159 lb 8 oz (72.349 kg), SpO2 100 %.    HEENT: No thrush or ulcers. Resp: Lungs clear bilaterally.  Cardio: Regular rate and rhythm. GI: Abdomen soft and nontender. No hepatomegaly. Vascular: No leg edema. Neuro: Vibratory sense minimally decreased over the fingertips per tuning fork exam.  Skin: No rash. Port-A-Cath without erythema.    Lab Results:  Lab Results  Component Value Date   WBC 11.9* 01/04/2016   HGB 9.9* 01/04/2016   HCT 31.0* 01/04/2016   MCV 86.8 01/04/2016   PLT 297 01/04/2016   NEUTROABS 9.8* 01/04/2016    Imaging:  No results found.  Medications: I have reviewed the patient's current medications.  Assessment/Plan: 1. 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 Diabetes  3. Anorexia/weight loss secondary to #1  4. Obstructive jaundice secondary to #1-status post placement of a percutaneous internal/external biliary drain 11/25/2015;  biliary drainage catheter capped 12/02/2015; biliary drain internalized 12/13/2015  5. Port-A-Cath placement 11/30/2015 interventional radiology  Disposition: Jacob Hardin appears stable. He has completed 2 cycles of FOLFIRINOX. Plan to proceed with cycle 3 today as scheduled. He will return for a follow-up visit and cycle 4 in 2 weeks. We will repeat the CA-19-9 when he returns in 2 weeks. He will contact the office in the interim with any problems.    [name] ANP/GNP-BC   01/04/2016  9:24 AM

## 2016-01-04 NOTE — Patient Instructions (Signed)
[facility] Discharge Instructions for Patients Receiving Chemotherapy  Today you received the following chemotherapy agents OXaliplatin, Leucovorin, Irinotecan and Fluorouracil.  To help prevent nausea and vomiting after your treatment, we encourage you to take your nausea medication as directed.   If you develop nausea and vomiting that is not controlled by your nausea medication, call the clinic.   BELOW ARE SYMPTOMS THAT SHOULD BE REPORTED IMMEDIATELY:  *FEVER GREATER THAN 100.5 F  *CHILLS WITH OR WITHOUT FEVER  NAUSEA AND VOMITING THAT IS NOT CONTROLLED WITH YOUR NAUSEA MEDICATION  *UNUSUAL SHORTNESS OF BREATH  *UNUSUAL BRUISING OR BLEEDING  TENDERNESS IN MOUTH AND THROAT WITH OR WITHOUT PRESENCE OF ULCERS  *URINARY PROBLEMS  *BOWEL PROBLEMS  UNUSUAL RASH Items with * indicate a potential emergency and should be followed up as soon as possible.  Feel free to call the clinic you have any questions or concerns. The clinic phone number is [phone].  Please show the [facility] at check-in to the Emergency Department and triage nurse.

## 2016-01-04 NOTE — Patient Instructions (Signed)

## 2016-01-04 NOTE — Progress Notes (Signed)
Nutrition follow-up completed with patient diagnosed with stage IV pancreas cancer. Patient reports he feels well. Constipation has resolved.  Patient denies nausea or vomiting. Weight decreased and documented as 159.5 pounds March 15, down from 161.1 pounds March 1. Patient continues to drink one Premier protein shake daily.  Nutrition diagnosis: Unintended weight loss continues.  Intervention:  Patient educated to continue strategies for increasing calories and protein to minimize further weight loss. Recommended patient continue strategies for bowel regimen to minimize constipation. Recommended patient switched to whole milk from 2% milk. Teach back method used.  Monitoring, evaluation, goals: Patient will increase oral intake to minimize further weight loss.  Next visit: Wednesday able 12, during infusion.  **Disclaimer: This note was dictated with voice recognition software. Similar sounding words can inadvertently be transcribed and this note may contain transcription errors which may not have been corrected upon publication of note.**

## 2016-01-06 ENCOUNTER — Ambulatory Visit: Payer: Medicare Other

## 2016-01-06 ENCOUNTER — Ambulatory Visit (HOSPITAL_BASED_OUTPATIENT_CLINIC_OR_DEPARTMENT_OTHER): Payer: Medicare Other

## 2016-01-06 VITALS — BP 154/72 | HR 79 | Temp 99.0°F | Resp 16

## 2016-01-06 DIAGNOSIS — C250 Malignant neoplasm of head of pancreas: Secondary | ICD-10-CM

## 2016-01-06 DIAGNOSIS — Z5189 Encounter for other specified aftercare: Secondary | ICD-10-CM | POA: Diagnosis not present

## 2016-01-06 DIAGNOSIS — Z452 Encounter for adjustment and management of vascular access device: Secondary | ICD-10-CM

## 2016-01-06 MED ORDER — PEGFILGRASTIM INJECTION 6 MG/0.6ML ~~LOC~~
6.0000 mg | PREFILLED_SYRINGE | Freq: Once | SUBCUTANEOUS | Status: AC
  Administered 2016-01-06: 6 mg via SUBCUTANEOUS
  Filled 2016-01-06: qty 0.6

## 2016-01-06 MED ORDER — HEPARIN SOD (PORK) LOCK FLUSH 100 UNIT/ML IV SOLN
500.0000 [IU] | Freq: Once | INTRAVENOUS | Status: AC | PRN
  Administered 2016-01-06: 500 [IU]
  Filled 2016-01-06: qty 5

## 2016-01-06 MED ORDER — SODIUM CHLORIDE 0.9% FLUSH
10.0000 mL | INTRAVENOUS | Status: DC | PRN
  Administered 2016-01-06: 10 mL
  Filled 2016-01-06: qty 10

## 2016-01-06 NOTE — Patient Instructions (Signed)

## 2016-01-06 NOTE — Progress Notes (Signed)
Neulasta injection given by infusion nurse after home infusion pump disconnected 

## 2016-01-15 ENCOUNTER — Other Ambulatory Visit: Payer: Self-pay | Admitting: Oncology

## 2016-01-17 ENCOUNTER — Encounter (HOSPITAL_COMMUNITY): Payer: Self-pay | Admitting: *Deleted

## 2016-01-17 ENCOUNTER — Telehealth: Payer: Self-pay | Admitting: *Deleted

## 2016-01-17 ENCOUNTER — Inpatient Hospital Stay (HOSPITAL_COMMUNITY)
Admission: EM | Admit: 2016-01-17 | Discharge: 2016-01-22 | DRG: 872 | Disposition: A | Discharge status: Home Health | Payer: Medicare Other | Attending: Internal Medicine | Admitting: Internal Medicine

## 2016-01-17 ENCOUNTER — Emergency Department (HOSPITAL_COMMUNITY): Payer: Medicare Other

## 2016-01-17 DIAGNOSIS — E440 Moderate protein-calorie malnutrition: Secondary | ICD-10-CM | POA: Diagnosis present

## 2016-01-17 DIAGNOSIS — E785 Hyperlipidemia, unspecified: Secondary | ICD-10-CM | POA: Diagnosis present

## 2016-01-17 DIAGNOSIS — E869 Volume depletion, unspecified: Secondary | ICD-10-CM | POA: Diagnosis present

## 2016-01-17 DIAGNOSIS — C787 Secondary malignant neoplasm of liver and intrahepatic bile duct: Secondary | ICD-10-CM | POA: Diagnosis present

## 2016-01-17 DIAGNOSIS — C801 Malignant (primary) neoplasm, unspecified: Secondary | ICD-10-CM | POA: Diagnosis present

## 2016-01-17 DIAGNOSIS — R509 Fever, unspecified: Secondary | ICD-10-CM | POA: Diagnosis not present

## 2016-01-17 DIAGNOSIS — I10 Essential (primary) hypertension: Secondary | ICD-10-CM | POA: Diagnosis present

## 2016-01-17 DIAGNOSIS — E876 Hypokalemia: Secondary | ICD-10-CM | POA: Diagnosis not present

## 2016-01-17 DIAGNOSIS — T80219A Unspecified infection due to central venous catheter, initial encounter: Secondary | ICD-10-CM | POA: Insufficient documentation

## 2016-01-17 DIAGNOSIS — B954 Other streptococcus as the cause of diseases classified elsewhere: Secondary | ICD-10-CM | POA: Diagnosis not present

## 2016-01-17 DIAGNOSIS — E871 Hypo-osmolality and hyponatremia: Secondary | ICD-10-CM | POA: Diagnosis present

## 2016-01-17 DIAGNOSIS — D899 Disorder involving the immune mechanism, unspecified: Secondary | ICD-10-CM | POA: Diagnosis present

## 2016-01-17 DIAGNOSIS — C799 Secondary malignant neoplasm of unspecified site: Secondary | ICD-10-CM

## 2016-01-17 DIAGNOSIS — C250 Malignant neoplasm of head of pancreas: Secondary | ICD-10-CM | POA: Diagnosis present

## 2016-01-17 DIAGNOSIS — A419 Sepsis, unspecified organism: Secondary | ICD-10-CM | POA: Diagnosis not present

## 2016-01-17 DIAGNOSIS — A491 Streptococcal infection, unspecified site: Secondary | ICD-10-CM | POA: Insufficient documentation

## 2016-01-17 DIAGNOSIS — Z794 Long term (current) use of insulin: Secondary | ICD-10-CM | POA: Diagnosis not present

## 2016-01-17 DIAGNOSIS — Z66 Do not resuscitate: Secondary | ICD-10-CM | POA: Diagnosis present

## 2016-01-17 DIAGNOSIS — A408 Other streptococcal sepsis: Principal | ICD-10-CM | POA: Diagnosis present

## 2016-01-17 DIAGNOSIS — Z6821 Body mass index (BMI) 21.0-21.9, adult: Secondary | ICD-10-CM

## 2016-01-17 DIAGNOSIS — R7881 Bacteremia: Secondary | ICD-10-CM | POA: Insufficient documentation

## 2016-01-17 DIAGNOSIS — Z95828 Presence of other vascular implants and grafts: Secondary | ICD-10-CM | POA: Diagnosis not present

## 2016-01-17 DIAGNOSIS — E119 Type 2 diabetes mellitus without complications: Secondary | ICD-10-CM

## 2016-01-17 DIAGNOSIS — C259 Malignant neoplasm of pancreas, unspecified: Secondary | ICD-10-CM | POA: Insufficient documentation

## 2016-01-17 DIAGNOSIS — R569 Unspecified convulsions: Secondary | ICD-10-CM | POA: Diagnosis present

## 2016-01-17 DIAGNOSIS — R6510 Systemic inflammatory response syndrome (SIRS) of non-infectious origin without acute organ dysfunction: Secondary | ICD-10-CM | POA: Insufficient documentation

## 2016-01-17 DIAGNOSIS — T80211D Bloodstream infection due to central venous catheter, subsequent encounter: Secondary | ICD-10-CM | POA: Diagnosis not present

## 2016-01-17 DIAGNOSIS — D849 Immunodeficiency, unspecified: Secondary | ICD-10-CM

## 2016-01-17 DIAGNOSIS — Y838 Other surgical procedures as the cause of abnormal reaction of the patient, or of later complication, without mention of misadventure at the time of the procedure: Secondary | ICD-10-CM | POA: Diagnosis not present

## 2016-01-17 HISTORY — DX: Unspecified malignant neoplasm of skin, unspecified: C44.90

## 2016-01-17 HISTORY — DX: Type 2 diabetes mellitus without complications: E11.9

## 2016-01-17 LAB — COMPREHENSIVE METABOLIC PANEL
ALK PHOS: 632 U/L — AB (ref 38–126)
ALT: 55 U/L (ref 17–63)
ANION GAP: 12 (ref 5–15)
AST: 48 U/L — ABNORMAL HIGH (ref 15–41)
Albumin: 2.7 g/dL — ABNORMAL LOW (ref 3.5–5.0)
BILIRUBIN TOTAL: 0.7 mg/dL (ref 0.3–1.2)
BUN: 13 mg/dL (ref 6–20)
CALCIUM: 8.7 mg/dL — AB (ref 8.9–10.3)
CO2: 21 mmol/L — AB (ref 22–32)
CREATININE: 1.11 mg/dL (ref 0.61–1.24)
Chloride: 98 mmol/L — ABNORMAL LOW (ref 101–111)
GFR calc non Af Amer: >60 mL/min (ref >60)
GLUCOSE: 253 mg/dL — AB (ref 65–99)
Potassium: 3.5 mmol/L (ref 3.5–5.1)
SODIUM: 131 mmol/L — AB (ref 135–145)
TOTAL PROTEIN: 6 g/dL — AB (ref 6.5–8.1)

## 2016-01-17 LAB — CBC WITH DIFFERENTIAL/PLATELET
BASOS ABS: 0 10*3/uL (ref 0.0–0.1)
BASOS PCT: 0 %
EOS ABS: 0 10*3/uL (ref 0.0–0.7)
Eosinophils Relative: 0 %
HCT: 30.3 % — ABNORMAL LOW (ref 39.0–52.0)
Hemoglobin: 9.8 g/dL — ABNORMAL LOW (ref 13.0–17.0)
Lymphocytes Relative: 3 %
Lymphs Abs: 0.5 10*3/uL — ABNORMAL LOW (ref 0.7–4.0)
MCH: 27.5 pg (ref 26.0–34.0)
MCHC: 32.3 g/dL (ref 30.0–36.0)
MCV: 85.1 fL (ref 78.0–100.0)
MONO ABS: 0.9 10*3/uL (ref 0.1–1.0)
Monocytes Relative: 6 %
NEUTROS ABS: 14 10*3/uL — AB (ref 1.7–7.7)
NEUTROS PCT: 91 %
PLATELETS: 248 10*3/uL (ref 150–400)
RBC: 3.56 MIL/uL — ABNORMAL LOW (ref 4.22–5.81)
RDW: 15.8 % — AB (ref 11.5–15.5)
WBC: 15.4 10*3/uL — ABNORMAL HIGH (ref 4.0–10.5)

## 2016-01-17 LAB — GLUCOSE, CAPILLARY: Glucose-Capillary: 115 mg/dL — ABNORMAL HIGH (ref 65–99)

## 2016-01-17 LAB — URINE MICROSCOPIC-ADD ON

## 2016-01-17 LAB — CBG MONITORING, ED: GLUCOSE-CAPILLARY: 256 mg/dL — AB (ref 65–99)

## 2016-01-17 LAB — URINALYSIS, ROUTINE W REFLEX MICROSCOPIC
Ketones, ur: NEGATIVE mg/dL
Leukocytes, UA: NEGATIVE
Nitrite: NEGATIVE
Protein, ur: 100 mg/dL — AB
Specific Gravity, Urine: 1.026 (ref 1.005–1.030)
pH: 5.5 (ref 5.0–8.0)

## 2016-01-17 LAB — LACTIC ACID, PLASMA
Lactic Acid, Venous: 2.2 mmol/L (ref 0.5–2.0)
Lactic Acid, Venous: 1.2 mmol/L (ref 0.5–2.0)

## 2016-01-17 LAB — I-STAT CG4 LACTIC ACID, ED: Lactic Acid, Venous: 2.88 mmol/L (ref 0.5–2.0)

## 2016-01-17 LAB — INFLUENZA PANEL BY PCR (TYPE A & B)
H1N1FLUPCR: NOT DETECTED
Influenza A By PCR: NEGATIVE
Influenza B By PCR: NEGATIVE

## 2016-01-17 MED ORDER — SODIUM CHLORIDE 0.9 % IV SOLN
1000.0000 mL | INTRAVENOUS | Status: DC
Start: 2016-01-17 — End: 2016-01-17
  Administered 2016-01-17: 1000 mL via INTRAVENOUS

## 2016-01-17 MED ORDER — SODIUM CHLORIDE 0.9 % IV SOLN
1000.0000 mL | INTRAVENOUS | Status: DC
  Administered 2016-01-18 – 2016-01-20 (×5): 1000 mL via INTRAVENOUS

## 2016-01-17 MED ORDER — SODIUM CHLORIDE 0.9 % IV BOLUS (SEPSIS): 1000.0000 mL | INTRAVENOUS | Status: DC

## 2016-01-17 MED ORDER — ACETAMINOPHEN 325 MG PO TABS
650.0000 mg | ORAL_TABLET | Freq: Four times a day (QID) | ORAL | Status: DC | PRN
  Filled 2016-01-17: qty 2

## 2016-01-17 MED ORDER — PIPERACILLIN-TAZOBACTAM 3.375 G IVPB
3.3750 g | Freq: Three times a day (TID) | INTRAVENOUS | Status: DC
  Administered 2016-01-17 – 2016-01-20 (×8): 3.375 g via INTRAVENOUS
  Filled 2016-01-17 (×10): qty 50

## 2016-01-17 MED ORDER — ENOXAPARIN SODIUM 40 MG/0.4ML ~~LOC~~ SOLN
40.0000 mg | SUBCUTANEOUS | Status: DC
  Administered 2016-01-17 – 2016-01-21 (×5): 40 mg via SUBCUTANEOUS
  Filled 2016-01-17 (×5): qty 0.4

## 2016-01-17 MED ORDER — IBUPROFEN 600 MG PO TABS
600.0000 mg | ORAL_TABLET | Freq: Four times a day (QID) | ORAL | Status: DC | PRN
  Administered 2016-01-20 – 2016-01-21 (×2): 600 mg via ORAL
  Filled 2016-01-17 (×2): qty 1

## 2016-01-17 MED ORDER — SODIUM CHLORIDE 0.9 % IV BOLUS (SEPSIS): 500.0000 mL | INTRAVENOUS | Status: AC

## 2016-01-17 MED ORDER — ENSURE ENLIVE PO LIQD
237.0000 mL | Freq: Two times a day (BID) | ORAL | Status: DC
  Administered 2016-01-18 – 2016-01-22 (×5): 237 mL via ORAL

## 2016-01-17 MED ORDER — LORATADINE 10 MG PO TABS
10.0000 mg | ORAL_TABLET | Freq: Every day | ORAL | Status: DC
  Administered 2016-01-18 – 2016-01-22 (×5): 10 mg via ORAL
  Filled 2016-01-17 (×5): qty 1

## 2016-01-17 MED ORDER — SODIUM CHLORIDE 0.9 % IV SOLN
INTRAVENOUS | Status: AC
  Administered 2016-01-17: 19:00:00 via INTRAVENOUS

## 2016-01-17 MED ORDER — ACETAMINOPHEN 650 MG RE SUPP: 650.0000 mg | Freq: Four times a day (QID) | RECTAL | Status: DC | PRN

## 2016-01-17 MED ORDER — ONDANSETRON HCL 4 MG/2ML IJ SOLN: 4.0000 mg | Freq: Four times a day (QID) | INTRAMUSCULAR | Status: DC | PRN

## 2016-01-17 MED ORDER — LOPERAMIDE HCL 2 MG PO TABS: 2.0000 mg | ORAL_TABLET | Freq: Four times a day (QID) | ORAL | Status: DC | PRN

## 2016-01-17 MED ORDER — SODIUM CHLORIDE 0.9 % IV BOLUS (SEPSIS)
1000.0000 mL | INTRAVENOUS | Status: AC
  Administered 2016-01-17 (×2): 1000 mL via INTRAVENOUS

## 2016-01-17 MED ORDER — SODIUM CHLORIDE 0.9 % IV BOLUS (SEPSIS)
500.0000 mL | Freq: Once | INTRAVENOUS | Status: AC
  Administered 2016-01-17: 500 mL via INTRAVENOUS

## 2016-01-17 MED ORDER — VANCOMYCIN HCL IN DEXTROSE 1-5 GM/200ML-% IV SOLN
1000.0000 mg | Freq: Once | INTRAVENOUS | Status: AC
  Administered 2016-01-17: 1000 mg via INTRAVENOUS
  Filled 2016-01-17: qty 200

## 2016-01-17 MED ORDER — VANCOMYCIN HCL 500 MG IV SOLR
500.0000 mg | Freq: Two times a day (BID) | INTRAVENOUS | Status: DC
  Administered 2016-01-18 – 2016-01-20 (×5): 500 mg via INTRAVENOUS
  Filled 2016-01-17 (×7): qty 500

## 2016-01-17 MED ORDER — PROCHLORPERAZINE MALEATE 10 MG PO TABS
10.0000 mg | ORAL_TABLET | Freq: Four times a day (QID) | ORAL | Status: DC | PRN
  Filled 2016-01-17: qty 1

## 2016-01-17 MED ORDER — INSULIN DETEMIR 100 UNIT/ML FLEXPEN: 20.0000 [IU] | PEN_INJECTOR | Freq: Every day | SUBCUTANEOUS | Status: DC

## 2016-01-17 MED ORDER — PIPERACILLIN-TAZOBACTAM 3.375 G IVPB 30 MIN
3.3750 g | Freq: Once | INTRAVENOUS | Status: AC
  Administered 2016-01-17: 3.375 g via INTRAVENOUS
  Filled 2016-01-17: qty 50

## 2016-01-17 MED ORDER — LOPERAMIDE HCL 2 MG PO CAPS: 2.0000 mg | ORAL_CAPSULE | Freq: Four times a day (QID) | ORAL | Status: DC | PRN

## 2016-01-17 MED ORDER — IBUPROFEN 400 MG PO TABS
600.0000 mg | ORAL_TABLET | Freq: Once | ORAL | Status: AC
  Administered 2016-01-17: 600 mg via ORAL
  Filled 2016-01-17: qty 1

## 2016-01-17 MED ORDER — ACETAMINOPHEN 500 MG PO TABS: 1000.0000 mg | ORAL_TABLET | Freq: Once | ORAL | Status: DC

## 2016-01-17 MED ORDER — ONE-DAILY MULTI VITAMINS PO TABS: 1.0000 | ORAL_TABLET | Freq: Every day | ORAL | Status: DC

## 2016-01-17 MED ORDER — ONDANSETRON HCL 4 MG PO TABS: 4.0000 mg | ORAL_TABLET | Freq: Four times a day (QID) | ORAL | Status: DC | PRN

## 2016-01-17 MED ORDER — ADULT MULTIVITAMIN W/MINERALS CH
1.0000 | ORAL_TABLET | Freq: Every day | ORAL | Status: DC
Start: 2016-01-17 — End: 2016-01-22
  Administered 2016-01-18 – 2016-01-22 (×5): 1 via ORAL
  Filled 2016-01-17 (×5): qty 1

## 2016-01-17 MED ORDER — SODIUM CHLORIDE 0.9 % IV BOLUS (SEPSIS): 500.0000 mL | INTRAVENOUS | Status: DC

## 2016-01-17 MED ORDER — PANCRELIPASE (LIP-PROT-AMYL) 12000-38000 UNITS PO CPEP
12000.0000 [IU] | ORAL_CAPSULE | Freq: Three times a day (TID) | ORAL | Status: DC
  Administered 2016-01-18: 12000 [IU] via ORAL
  Filled 2016-01-17: qty 1

## 2016-01-17 MED ORDER — INSULIN DETEMIR 100 UNIT/ML ~~LOC~~ SOLN
20.0000 [IU] | Freq: Every day | SUBCUTANEOUS | Status: DC
  Administered 2016-01-19 – 2016-01-22 (×4): 20 [IU] via SUBCUTANEOUS
  Filled 2016-01-17 (×6): qty 0.2

## 2016-01-17 NOTE — Telephone Encounter (Signed)
Message from pt's friend, Jan: Butch Penny called EMS to transport pt to [facility] ED. EMT thought pt's symptoms appeared neurological so the took him to [facility] ED. Dr. [name] made aware.

## 2016-01-17 NOTE — H&P (Signed)
[facility] History and Physical  Jacob Hardin [id number] DOB: [birth year] DOA: 01/17/2016  Referring physician: EDP PCP: Jacob Fraction, MD   Chief Complaint: shakes  HPI: Jacob Hardin is a 70 y.o. 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 Patient's wife reports that he's not been feeling well since his most recent round of chemotherapy he had a fever of 103, 2 nights ago and 102 last night. They deny any abdominal pain, nausea, vomiting, diarrhea, cough congestion, rash etc. He also denies any flulike symptoms. Today he was having severe shakes and his wife suspected that he was having seizures since gotten to [facility] emergency room was noted to have a fever of 10 33F. Rest of his vital signs were stable in the emergency room, fever down to 103.1 after a dose of ibuprofen. WBC is 15K  Review of Systems: positives bolded Constitutional:  No weight loss, night sweats, Fevers, chills, fatigue.  HEENT:  No headaches, Difficulty swallowing,Tooth/dental problems,Sore throat,  No sneezing, itching, ear ache, nasal congestion, post nasal drip,  Cardio-vascular:  No chest pain, Orthopnea, PND, swelling in lower extremities, anasarca, dizziness, palpitations  GI:  No heartburn, indigestion, abdominal pain, nausea, vomiting, diarrhea, change in bowel habits, loss of appetite  Resp:  No shortness of breath with exertion or at rest. No excess mucus, no productive cough, No non-productive cough, No coughing up of blood.No change in color of mucus.No wheezing.No chest wall deformity  Skin:  no rash or lesions.  GU:  no dysuria, change in color of urine, no urgency or frequency. No flank pain.  Musculoskeletal:  No joint  pain or swelling. No decreased range of motion. No back pain.  Psych:  No change in mood or affect. No depression or anxiety. No memory loss.   Past Medical History  Diagnosis Date  . Diabetes mellitus without complication ([place])   . Hyperlipidemia   . Complication of anesthesia   . PONV (postoperative nausea and vomiting)   . Hypertension     recently weight lost-no meds now-running low.  . Primary pancreatic adenocarcinoma ([place])     mets to liver and duodenum   Past Surgical History  Procedure Laterality Date  . Spine surgery  1994    cervical-bone graft  . Rotator cuff repair Left 2014  . Tonsillectomy    . Hernia repair    . Colonoscopy    . Wisdom tooth extraction    . Eus  11/18/2015    Procedure: UPPER ENDOSCOPIC ULTRASOUND (EUS) LINEAR;  Surgeon: Jacob Ada, MD;  Location: [facility] ENDOSCOPY;  Service: Endoscopy;;  . Esophagogastroduodenoscopy N/A 11/18/2015    Procedure: ESOPHAGOGASTRODUODENOSCOPY (EGD);  Surgeon: Jacob Ada, MD;  Location: [facility] ENDOSCOPY;  Service: Endoscopy;  Laterality: N/A;  . Esophagogastroduodenoscopy N/A 11/22/2015    Procedure: ESOPHAGOGASTRODUODENOSCOPY (EGD);  Surgeon: Jacob Ada, MD;  Location: [facility] ENDOSCOPY;  Service: Endoscopy;  Laterality: N/A;  Uncovered duodenal stent placement.  Fluoroscopy required.  . Duodenal stent placement N/A 11/22/2015    Procedure: DUODENAL STENT PLACEMENT;  Surgeon: Jacob Ada, MD;  Location: [facility] ENDOSCOPY;  Service: Endoscopy;  Laterality: N/A;   Social History:  reports that he has never smoked. He has never used smokeless tobacco. He reports that he does not drink alcohol or use illicit drugs.  Allergies  Allergen Reactions  . Codeine Nausea Only    Family History  Problem Relation Age of Onset  . Diabetes Mother   . Hypertension Mother   . Cancer Father     lung 70  . Hypertension Father   . Cancer Brother     pancreatic   . Diabetes Brother   . Cancer Paternal Uncle     prostate  . Cancer Paternal  Grandfather     colon    Prior to Admission medications   Medication Sig Start Date End Date Taking? Authorizing Provider  Insulin Detemir (LEVEMIR FLEXTOUCH) 100 UNIT/ML Pen Inject 20 Units into the skin daily at 10 pm. Patient taking differently: Inject 40 Units into the skin daily with breakfast.  05/25/15  Yes Jacob Frizzle, MD  lidocaine-prilocaine (EMLA) cream Apply small amount over port area 1 hour prior to treatment and cover with plastic wrap.  DO NOT RUB IN 12/02/15  Yes Jacob Pier, MD  lipase/protease/amylase (CREON) 12000 units CPEP capsule Take 2 tabs before breakfast and 1 tab before lunch and supper 12/21/15  Yes Jacob Shark, NP  loperamide (IMODIUM A-D) 2 MG tablet Take 2 mg by mouth 4 (four) times daily as needed for diarrhea or loose stools.   Yes Historical Provider, MD  loratadine (CLARITIN) 10 MG tablet Take 10 mg by mouth daily. Takes couple day prior to Neulasta injection   Yes Historical Provider, MD  Multiple Vitamin (MULTIVITAMIN) tablet Take 1 tablet by mouth daily. CentrumSilver   Yes Historical Provider, MD  prochlorperazine (COMPAZINE) 10 MG tablet Take 1 tablet (10 mg total) by mouth every 6 (six) hours as needed for nausea or vomiting. 12/02/15  Yes Jacob Pier, MD  baclofen (LIORESAL) 10 MG tablet Take 0.5 tablets (5 mg total) by mouth 3 (three) times daily as needed (Hiccups). Patient not taking: Reported on 01/17/2016 12/21/15   Jacob Shark, NP  BD PEN NEEDLE NANO U/F 32G X 4 MM MISC USE TO INJECT INSULIN TWICE A DAY 07/26/15   Jacob Frizzle, MD   Physical Exam: Filed Vitals:   01/17/16 1615 01/17/16 1630 01/17/16 1646 01/17/16 1700  BP: 148/71 143/63  134/63  Pulse: 102 98  92  Temp:   103.1 F (39.5 C)   TempSrc:   Rectal   Resp: 25 17  21   Weight:      SpO2: 100% 99%  99%    Wt Readings from Last 3 Encounters:  01/17/16 67.246 kg (148 lb 4 oz)  01/04/16 72.349 kg (159 lb 8 oz)  12/21/15 73.074 kg (161 lb 1.6 oz)    General:  Appears  calm and comfortable, non distress Eyes: PERRL, normal lids, irises & conjunctiva ENT: grossly normal lips & tongue, no pharyngeal erythema Neck: no LAD, masses or thyromegaly Cardiovascular: RRR, no m/r/g. No LE edema. Telemetry: SR, no arrhythmias  Respiratory: CTA bilaterally, no w/r/r. Normal respiratory effort. Abdomen: soft, ntnd, BS present Skin: no rash or induration seen on limited exam Musculoskeletal: grossly normal tone BUE/BLE Psychiatric: grossly normal mood and affect, speech fluent and appropriate Neurologic: grossly non-focal.          Labs on Admission:  Basic Metabolic Panel:  Recent Labs Lab 01/17/16 1534  NA 131*  K 3.5  CL 98*  CO2 21*  GLUCOSE 253*  BUN 13  CREATININE 1.11  CALCIUM 8.7*   Liver Function Tests:  Recent Labs Lab 01/17/16 1534  AST 48*  ALT 55  ALKPHOS 632*  BILITOT 0.7  PROT 6.0*  ALBUMIN 2.7*   No results for input(s): LIPASE, AMYLASE in the last 168 hours. No results for input(s): AMMONIA in the last 168 hours. CBC:  Recent Labs Lab 01/17/16 1534  WBC 15.4*  NEUTROABS 14.0*  HGB 9.8*  HCT 30.3*  MCV 85.1  PLT 248   Cardiac Enzymes: No results for input(s): CKTOTAL, CKMB, CKMBINDEX, TROPONINI in the last 168 hours.  BNP (last 3 results) No results for input(s): BNP in the last 8760 hours.  ProBNP (last 3 results) No results for input(s): PROBNP in the last 8760 hours.  CBG:  Recent Labs Lab 01/17/16 1450  GLUCAP 256*    Radiological Exams on Admission: Dg Chest Port 1 View  01/17/2016  CLINICAL DATA:  High grade fever today, patient on chemotherapy for pancreatic cancer EXAM: PORTABLE CHEST 1 VIEW COMPARISON:  None. FINDINGS: Right Port-A-Cath with tip to the cavoatrial junction. Heart size and vascular pattern normal. No consolidation or effusion. IMPRESSION: No active disease. Electronically Signed   By: Skipper Cliche M.D.   On: 01/17/2016 15:28    EKG: Independently reviewed. Sinus tachycardia,  LAFB  Assessment/Plan Principal Problem:   Fever/SIRS (systemic inflammatory response syndrome) (HCC) -source unclear -bacteremia is a possibility, has a port, do not suspect cholangitis with normal bili and absence of abd pain -IVF, continue broad spectrum Abx -FU Blood Cx, FU FLU PCR -could be related to his underlying malignancy if infectious workup is negative, consider CT abd if continues to have fevers -trend lactate    Diabetes mellitus without complication ([place]) -continue lantus at half dose, SSI    Stage 4 pancreatic CA -liver mets, on chemo, followed by Dr.Sherril, will notify him via EPIC    Hyponatremia -think its due to volume depletion, hydrate and monitor   Code Status: DNR DVT Prophylaxis:lovenox Family Communication: wife Disposition Plan: inpatient  Time spent: 30min  Lanett Lasorsa [facility] Pager [phone]

## 2016-01-17 NOTE — ED Notes (Signed)
Ice bag leaked on bed and gown - pt is refusing a gown and bed sheet change.

## 2016-01-17 NOTE — ED Notes (Signed)
CBG 256  °

## 2016-01-17 NOTE — Telephone Encounter (Signed)
Spouse Butch Penny called asking advice.  "Auguster had a fever Sunday 102.1, I gave ibuprofen, it came down.  Last night temp = 103.1, I gave ibuprofen, it came down.  He now is trembling all over.  Temp = 96.2.  I gave an ibuprofen a few minutes ago.  What do I need to do.  He has no other symptoms.  No N/V, diarrhea, cough, congestion, urinating fine.  Port-a-cath site looks good."  Offered [facility].  "He's trembling so bad he's not able to ride.  Dr. [name] notified.  Verbal order received and read back from Dr. [name] for Wife to call 911 to get him to ED for further evaluation.  Order given to Butch Penny at this time.  Says she "will see what she can do".

## 2016-01-17 NOTE — ED Notes (Signed)
Pt arrives from home via [facility]. Pt was noted to have a period of slurred speech followed by seizure like activity of the arms only noted by EMS x45 min. Pt was a&o during the episode, wife informed this RN that his cancer RN states the oxyplatin he is on can cause sx like this. Pt sx have resolved upon arrival. Pt does have stage 4 pancreatic cancer.

## 2016-01-17 NOTE — Progress Notes (Signed)
CRITICAL VALUE ALERT  Critical value received: Lactic acid=2.2  Date of notification:  01-17-16  Time of notification:  1954  Critical value read back:yes  Nurse who received alert: Skipper Cliche  MD notified (1st page): Luiz Blare  Time of first page: 2003  MD notified (2nd page):  Time of second page:  Responding MD: Forrest Moron, PA  Time MD responded: 20:06

## 2016-01-17 NOTE — Progress Notes (Signed)
Pharmacy Antibiotic Note  Jacob Hardin is a 70 y.o. male with pancreatic cancer admitted on 01/17/2016 with sepsis.  Pharmacy has been consulted for Vancomycin and Zosyn dosing. WBC 15.4, Tmax 103.1, LA 2.88, CrCl ~27mL/min   Plan: Vancomycin 1g IV x1 in the ED Vancomycin 500mg  IV 12h  Zosyn 3.375g IV x1 in the ED (30 min inf) Zosyn 3.375g IV Q8h (4 hr inf) F/U BMET and further abx dosing   Weight: 148 lb 4 oz (67.246 kg)  Temp (24hrs), Avg:102.4 F (39.1 C), Min:99.1 F (37.3 C), Max:105.6 F (40.9 C)  No results for input(s): WBC, CREATININE, LATICACIDVEN, VANCOTROUGH, VANCOPEAK, VANCORANDOM, GENTTROUGH, GENTPEAK, GENTRANDOM, TOBRATROUGH, TOBRAPEAK, TOBRARND, AMIKACINPEAK, AMIKACINTROU, AMIKACIN in the last 168 hours.  Estimated Creatinine Clearance: 67.2 mL/min (by C-G formula based on Cr of 1).    Allergies  Allergen Reactions  . Codeine Nausea Only    Antimicrobials this admission: 3/28 Vanc>> 3/28 Zosyn>>  Thank you for allowing pharmacy to be a part of this patient's care.  Faustina Gebert C. Lennox Grumbles, PharmD Pharmacy Resident  Pager: [phone] 01/17/2016 3:24 PM

## 2016-01-17 NOTE — ED Provider Notes (Signed)
CSN: [id number]     Arrival date & time 01/17/16  1406 History   First MD Initiated Contact with Patient 01/17/16 1410     Chief Complaint  Patient presents with  . Seizures     (Consider location/radiation/quality/duration/timing/severity/associated sxs/prior Treatment) The history is provided by the spouse and the patient.     Pt with metastatic pancreatic cancer, diabetes, stent placement for obstructive jaundice, port-a-cath p/w diffuse shaking and chills that began around 12:30pm today.  He has had fevers for the past two nights - two nights ago to 102.6 and yesterday to 103.1.  When he started shaking today his temperature was 96.  His last chemotherapy treatment was 2 weeks ago.  He denies any other sick symptoms at all including URI symptoms, any pain, N/V/D, urinary symptoms.   He and his wife were concerned for seizure or stroke, thought that his chemotherapy drug may be causing this.  The symptoms they were concerned were seizure or stroke pt was exhibiting while we spoke.    Oncologist is Dr [name].    Past Medical History  Diagnosis Date  . Diabetes mellitus without complication ([place])   . Hyperlipidemia   . Complication of anesthesia   . PONV (postoperative nausea and vomiting)   . Hypertension     recently weight lost-no meds now-running low.  . Primary pancreatic adenocarcinoma ([place])     mets to liver and duodenum   Past Surgical History  Procedure Laterality Date  . Spine surgery  1994    cervical-bone graft  . Rotator cuff repair Left 2014  . Tonsillectomy    . Hernia repair    . Colonoscopy    . Wisdom tooth extraction    . Eus  11/18/2015    Procedure: UPPER ENDOSCOPIC ULTRASOUND (EUS) LINEAR;  Surgeon: Carol Ada, MD;  Location: [facility] ENDOSCOPY;  Service: Endoscopy;;  . Esophagogastroduodenoscopy N/A 11/18/2015    Procedure: ESOPHAGOGASTRODUODENOSCOPY (EGD);  Surgeon: Carol Ada, MD;  Location: [facility] ENDOSCOPY;  Service: Endoscopy;  Laterality: N/A;  .  Esophagogastroduodenoscopy N/A 11/22/2015    Procedure: ESOPHAGOGASTRODUODENOSCOPY (EGD);  Surgeon: Carol Ada, MD;  Location: [facility] ENDOSCOPY;  Service: Endoscopy;  Laterality: N/A;  Uncovered duodenal stent placement.  Fluoroscopy required.  . Duodenal stent placement N/A 11/22/2015    Procedure: DUODENAL STENT PLACEMENT;  Surgeon: Carol Ada, MD;  Location: [facility] ENDOSCOPY;  Service: Endoscopy;  Laterality: N/A;   Family History  Problem Relation Age of Onset  . Diabetes Mother   . Hypertension Mother   . Cancer Father     lung 59  . Hypertension Father   . Cancer Brother     pancreatic   . Diabetes Brother   . Cancer Paternal Uncle     prostate  . Cancer Paternal Grandfather     colon   Social History  Substance Use Topics  . Smoking status: Never Smoker   . Smokeless tobacco: Never Used  . Alcohol Use: No    Review of Systems  All other systems reviewed and are negative.     Allergies  Codeine  Home Medications   Prior to Admission medications   Medication Sig Start Date End Date Taking? Authorizing Provider  baclofen (LIORESAL) 10 MG tablet Take 0.5 tablets (5 mg total) by mouth 3 (three) times daily as needed (Hiccups). 12/21/15   Owens Shark, NP  BD PEN NEEDLE NANO U/F 32G X 4 MM MISC USE TO INJECT INSULIN TWICE A DAY 07/26/15   [name], MD  Insulin Detemir (LEVEMIR FLEXTOUCH) 100 UNIT/ML Pen Inject 20 Units into the skin daily at 10 pm. Patient taking differently: Inject 40 Units into the skin daily with breakfast.  05/25/15   [name], MD  lidocaine-prilocaine (EMLA) cream Apply small amount over port area 1 hour prior to treatment and cover with plastic wrap.  DO NOT RUB IN 12/02/15   Ladell Pier, MD  lipase/protease/amylase (CREON) 12000 units CPEP capsule Take 2 tabs before breakfast and 1 tab before lunch and supper 12/21/15   Owens Shark, NP  loperamide (IMODIUM A-D) 2 MG tablet Take 2 mg by mouth 4 (four) times daily as needed for diarrhea or  loose stools.    Historical Provider, MD  loratadine (CLARITIN) 10 MG tablet Take 10 mg by mouth daily. Takes couple day prior to Neulasta injection    Historical Provider, MD  Multiple Vitamin (MULTIVITAMIN) tablet Take 1 tablet by mouth every morning. CentrumSilver    Historical Provider, MD  pegfilgrastim (NEULASTA) 6 MG/0.6ML injection Inject 6 mg into the skin once.    Historical Provider, MD  prochlorperazine (COMPAZINE) 10 MG tablet Take 1 tablet (10 mg total) by mouth every 6 (six) hours as needed for nausea or vomiting. 12/02/15   Ladell Pier, MD   BP 149/79 mmHg  Pulse 97  Temp(Src) 99.1 F (37.3 C) (Oral)  Resp 14  SpO2 98% Physical Exam  Constitutional: He appears well-developed and well-nourished. No distress.  Rigors.  Covered in multiple blankets.    HENT:  Head: Normocephalic and atraumatic.  Eyes: Conjunctivae are normal.  Neck: Normal range of motion. Neck supple.  Cardiovascular: Normal rate and regular rhythm.   Pulmonary/Chest: Effort normal and breath sounds normal. No respiratory distress. He has no wheezes. He has no rales.  Right chest port a cath.  No overlying erythema, edema, warmth, tenderness.    Abdominal: Soft. He exhibits no distension and no mass. There is no tenderness. There is no rebound and no guarding.  Neurological: He is alert. He exhibits normal muscle tone.  Skin: No rash noted. He is not diaphoretic.  Nursing note and vitals reviewed.   ED Course  Procedures (including critical care time) Labs Review Labs Reviewed  COMPREHENSIVE METABOLIC PANEL - Abnormal; Notable for the following:    Sodium 131 (*)    Chloride 98 (*)    CO2 21 (*)    Glucose, Bld 253 (*)    Calcium 8.7 (*)    Total Protein 6.0 (*)    Albumin 2.7 (*)    AST 48 (*)    Alkaline Phosphatase 632 (*)    All other components within normal limits  CBC WITH DIFFERENTIAL/PLATELET - Abnormal; Notable for the following:    WBC 15.4 (*)    RBC 3.56 (*)    Hemoglobin  9.8 (*)    HCT 30.3 (*)    RDW 15.8 (*)    Neutro Abs 14.0 (*)    Lymphs Abs 0.5 (*)    All other components within normal limits  URINALYSIS, ROUTINE W REFLEX MICROSCOPIC (NOT AT [facility]) - Abnormal; Notable for the following:    Color, Urine AMBER (*)    APPearance CLOUDY (*)    Glucose, UA >1000 (*)    Hgb urine dipstick MODERATE (*)    Bilirubin Urine SMALL (*)    Protein, ur 100 (*)    All other components within normal limits  URINE MICROSCOPIC-ADD ON - Abnormal; Notable for the following:  Squamous Epithelial / LPF 0-5 (*)    Bacteria, UA RARE (*)    Casts HYALINE CASTS (*)    Crystals CA OXALATE CRYSTALS (*)    All other components within normal limits  I-STAT CG4 LACTIC ACID, ED - Abnormal; Notable for the following:    Lactic Acid, Venous 2.88 (*)    All other components within normal limits  CBG MONITORING, ED - Abnormal; Notable for the following:    Glucose-Capillary 256 (*)    All other components within normal limits  CULTURE, BLOOD (ROUTINE X 2)  CULTURE, BLOOD (ROUTINE X 2)  URINE CULTURE  INFLUENZA PANEL BY PCR (TYPE A & B, H1N1)  I-STAT CG4 LACTIC ACID, ED    Imaging Review Dg Chest Port 1 View  01/17/2016  CLINICAL DATA:  High grade fever today, patient on chemotherapy for pancreatic cancer EXAM: PORTABLE CHEST 1 VIEW COMPARISON:  None. FINDINGS: Right Port-A-Cath with tip to the cavoatrial junction. Heart size and vascular pattern normal. No consolidation or effusion. IMPRESSION: No active disease. Electronically Signed   By: Skipper Cliche M.D.   On: 01/17/2016 15:28   I have personally reviewed and evaluated these images and lab results as part of my medical decision-making.   EKG Interpretation None       3:05 PM Temp 105 rectally.  Tylenol ordered, sepsis, unknown source abx ordered.    5:07 PM Admitted to [facility] Internal Medicine teaching service.    Medicine resident called and informed me patient's PCP is [facility] to [facility].    5:26 PM I spoke  with Dr Broadus John of [facility] who agrees to admission, recommends med-surg, full admission.    CRITICAL CARE Performed by: Clayton Bibles   Total critical care time: 30 minutes  Critical care time was exclusive of separately billable procedures and treating other patients.  Critical care was necessary to treat or prevent imminent or life-threatening deterioration.  Critical care was time spent personally by me on the following activities: development of treatment plan with patient and/or surrogate as well as nursing, discussions with consultants, evaluation of patient's response to treatment, examination of patient, obtaining history from patient or surrogate, ordering and performing treatments and interventions, ordering and review of laboratory studies, ordering and review of radiographic studies, pulse oximetry and re-evaluation of patient's condition.   MDM   Final diagnoses:  Sepsis, due to unspecified organism [facility])  Metastatic cancer ([place])  Immunocompromised patient ([place])    Immunocompromised patient presented with rigors, Sepsis protocol initiated. Rectal temp 105.  Workup did not reveal source of infection.   Lactic acid 2.8, leukocytosis 15, chronic anemia, baseline/chronic abnormalities of chemistry panel.  UA and CXR did not demonstrate infection.  Blood cultures pending.  Urine culture pending.  Vanc/zosyn started, IVF given.  Unclear if this could be related to chemotherapy medications, last chemo treatment was 2 weeks ago.  Admitted to [facility], Dr Broadus John accepting.      Clayton Bibles, PA-C 01/17/16 2019  Charlesetta Shanks, MD 01/18/16 [phone]

## 2016-01-18 ENCOUNTER — Inpatient Hospital Stay (HOSPITAL_COMMUNITY): Payer: Medicare Other

## 2016-01-18 ENCOUNTER — Ambulatory Visit: Payer: Medicare Other | Admitting: Oncology

## 2016-01-18 ENCOUNTER — Ambulatory Visit: Payer: Medicare Other

## 2016-01-18 ENCOUNTER — Other Ambulatory Visit: Payer: Medicare Other

## 2016-01-18 ENCOUNTER — Encounter (HOSPITAL_COMMUNITY): Payer: Self-pay | Admitting: Radiology

## 2016-01-18 ENCOUNTER — Other Ambulatory Visit: Payer: Self-pay | Admitting: *Deleted

## 2016-01-18 DIAGNOSIS — C801 Malignant (primary) neoplasm, unspecified: Secondary | ICD-10-CM

## 2016-01-18 DIAGNOSIS — C250 Malignant neoplasm of head of pancreas: Secondary | ICD-10-CM

## 2016-01-18 DIAGNOSIS — C799 Secondary malignant neoplasm of unspecified site: Secondary | ICD-10-CM

## 2016-01-18 DIAGNOSIS — R509 Fever, unspecified: Secondary | ICD-10-CM

## 2016-01-18 DIAGNOSIS — E119 Type 2 diabetes mellitus without complications: Secondary | ICD-10-CM

## 2016-01-18 DIAGNOSIS — C259 Malignant neoplasm of pancreas, unspecified: Secondary | ICD-10-CM | POA: Insufficient documentation

## 2016-01-18 DIAGNOSIS — A419 Sepsis, unspecified organism: Secondary | ICD-10-CM

## 2016-01-18 LAB — BASIC METABOLIC PANEL
Anion gap: 7 (ref 5–15)
BUN: 8 mg/dL (ref 6–20)
CALCIUM: 8.3 mg/dL — AB (ref 8.9–10.3)
CO2: 25 mmol/L (ref 22–32)
CREATININE: 0.9 mg/dL (ref 0.61–1.24)
Chloride: 110 mmol/L (ref 101–111)
GFR calc Af Amer: >60 mL/min (ref >60)
Glucose, Bld: 90 mg/dL (ref 65–99)
Potassium: 3.8 mmol/L (ref 3.5–5.1)
SODIUM: 142 mmol/L (ref 135–145)

## 2016-01-18 LAB — CBC
HCT: 26.6 % — ABNORMAL LOW (ref 39.0–52.0)
Hemoglobin: 8.5 g/dL — ABNORMAL LOW (ref 13.0–17.0)
MCH: 27.8 pg (ref 26.0–34.0)
MCHC: 32 g/dL (ref 30.0–36.0)
MCV: 86.9 fL (ref 78.0–100.0)
PLATELETS: 201 10*3/uL (ref 150–400)
RBC: 3.06 MIL/uL — AB (ref 4.22–5.81)
RDW: 16 % — AB (ref 11.5–15.5)
WBC: 14.8 10*3/uL — ABNORMAL HIGH (ref 4.0–10.5)

## 2016-01-18 LAB — GLUCOSE, CAPILLARY
GLUCOSE-CAPILLARY: 49 mg/dL — AB (ref 65–99)
Glucose-Capillary: 88 mg/dL (ref 65–99)
Glucose-Capillary: 245 mg/dL — ABNORMAL HIGH (ref 65–99)
Glucose-Capillary: 208 mg/dL — ABNORMAL HIGH (ref 65–99)

## 2016-01-18 LAB — LACTIC ACID, PLASMA: Lactic Acid, Venous: 2.2 mmol/L (ref 0.5–2.0)

## 2016-01-18 MED ORDER — PANCRELIPASE (LIP-PROT-AMYL) 12000-38000 UNITS PO CPEP
24000.0000 [IU] | ORAL_CAPSULE | Freq: Every day | ORAL | Status: DC
  Administered 2016-01-19 – 2016-01-22 (×4): 24000 [IU] via ORAL
  Filled 2016-01-18 (×4): qty 2

## 2016-01-18 MED ORDER — PANCRELIPASE (LIP-PROT-AMYL) 12000-38000 UNITS PO CPEP
12000.0000 [IU] | ORAL_CAPSULE | Freq: Two times a day (BID) | ORAL | Status: DC
  Administered 2016-01-18 – 2016-01-22 (×9): 12000 [IU] via ORAL
  Filled 2016-01-18 (×10): qty 1

## 2016-01-18 MED ORDER — IOHEXOL 300 MG/ML  SOLN
100.0000 mL | Freq: Once | INTRAMUSCULAR | Status: AC | PRN
  Administered 2016-01-18: 100 mL via INTRAVENOUS

## 2016-01-18 MED ORDER — IOHEXOL 300 MG/ML  SOLN
25.0000 mL | INTRAMUSCULAR | Status: AC
  Administered 2016-01-18 (×2): 25 mL via INTRAVENOUS

## 2016-01-18 NOTE — Progress Notes (Signed)
CRITICAL VALUE ALERT  Critical value received: Gram (+) cocci in chain an anaerobic bottle taken on 01/17/16 at 1534 from Lt FA Date of notification: 01/18/16 Time of notification:  0735 Critical value read back: yes  Nurse who received alert:  Jeralene Peters, RN MD notified (1st page):  Dr. Coralyn Pear Time of first page: 0815 MD notified (2nd page):N/A  Time of second page:N/A  Responding MD: Coralyn Pear Time MD responded: [phone]

## 2016-01-18 NOTE — Care Management Note (Signed)
Case Management Note  Patient Details  Name: Jacob Hardin MRN: [id number] Date of Birth: 05-Oct-1946  Subjective/Objective:                    Action/Plan:  Initial UR completed  Expected Discharge Date:                  Expected Discharge Plan:  Home/Self Care  In-House Referral:     Discharge planning Services     Post Acute Care Choice:    Choice offered to:     DME Arranged:    DME Agency:     HH Arranged:    [place] Agency:     Status of Service:  In process, will continue to follow  Medicare Important Message Given:    Date Medicare IM Given:    Medicare IM give by:    Date Additional Medicare IM Given:    Additional Medicare Important Message give by:     If discussed at [place] of Stay Meetings, dates discussed:    Additional Comments:  Marilu Favre, RN 01/18/2016, 11:36 AM

## 2016-01-18 NOTE — Progress Notes (Signed)
CRITICAL VALUE ALERT  Critical value received:Positive blood cultures; Gram (+) Cocci in pairs and chains in anaerobic bottle Date of notification:  01/18/16 Time of notification: 0909 Critical value read back:yes Nurse who received alert:  Jeralene Peters, RN MD notified (1st page):  Coralyn Pear Time of first page: [phone] MD notified (2nd page):N/A  Time of second page:N/A  Responding MD: Coralyn Pear Time MD responded: [phone]

## 2016-01-18 NOTE — Progress Notes (Addendum)
Inpatient Diabetes Program Recommendations  AACE/ADA: New Consensus Statement on Inpatient Glycemic Control (2015)  Target Ranges:  Prepandial:   less than 140 mg/dL      Peak postprandial:   less than 180 mg/dL (1-2 hours)      Critically ill patients:  140 - 180 mg/dL   Results for ARNEY, DRACE (MRN [id number]) as of 01/18/2016 11:03  Ref. Range 01/17/2016 14:50 01/17/2016 18:32 01/18/2016 08:00 01/18/2016 08:28  Glucose-Capillary Latest Ref Range: 65-99 mg/dL 256 (H) 115 (H) 49 (L) 88   Admit with: Fever/ SIRS  History: DM2, Stage 4 Pancreatic Cancer with Liver Mets  Home DM Meds: Levemir 40 units daily  Current Insulin Orders: Levemir 20 units daily      MD- Note patient with Severe Hypoglycemia this AM (CBG 49 mg/dl).  Has not received any insulin yet this admission.  Patient may have still had some Levemir insulin on board from home?  RN held Levemir 20 units this AM.  May want to consider reducing dose further to Levemir 10 units daily (25% home dose)  Please also start Novolog Sensitive Correction Scale/ SSI (0-9 units) Q4 hours      --Will follow patient during hospitalization--  Wyn Quaker RN, MSN, CDE Diabetes Coordinator Inpatient Glycemic Control Team Team Pager: [phone] (8a-5p)

## 2016-01-18 NOTE — Progress Notes (Signed)
[facility] PROGRESS NOTE  Jacob Hardin [id number] DOB: 1946-03-09 DOA: 01/17/2016 PCP: Jacob Fraction, MD  Assessment/Plan: 1. Sepsis -Present on admission, evidenced by fever of 105.6, heart rate of 112, white count of 15,400, blood cultures growing gram-positive cocci in chains x 2 cultures. Suspect the source of infection may be intra-abdominal given history of metastatic pancreatic cancer. -CT scan of abdomen and pelvis ordered to assess for possible intra-abdominal source of infection. -On physical examination his abdomen was nontender and overall appears nonfocal -Meanwhile plan to continue broad-spectrum IV antibiotic therapy, IV fluid resuscitation, supportive care  2.  Stage IV pancreatic cancer  -Pathology confirming adenocarcinoma -Currently receiving his care at the cancer center undergoing so far 3 cycles of FOLFIRINOX -He has a history of obstructive jaundice undergoing percutaneous biliary drain placement on 11/25/2015 -His primary oncologist Dr.Sherrill consulted  3.  Diabetes mellitus. 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 He was admitted to the medicine service on 01/17/2016. On presentation he was found to have a temperature of 105.6. Initial lab work did not reveal obvious source of infection. Chest x-ray negative for acute cardiopulmonary disease. Urinalysis unremarkable. He was further worked up with CT scan of abdomen  and pelvis to assess for possible intra-abdominal source of infection. Meanwhile started on broad-spectrum IV antibiotic therapy with vancomycin and Zosyn.  Objective: Filed Vitals:   01/17/16 2215 01/18/16 0537  BP: 105/67 138/83  Pulse: 71 72  Temp: 98.3 F (36.8 C) 98.4 F (36.9 C)  Resp: 18 18    Intake/Output Summary (Last 24 hours) at 01/18/16 1338 Last data filed at 01/18/16 0858  Gross per 24 hour  Intake 2076.67 ml  Output      0 ml  Net 2076.67 ml   Filed Weights   01/17/16 1451 01/17/16 1826  Weight: 67.246 kg (148 lb 4 oz) 72.031 kg (158 lb 12.8 oz)    Exam:   General:  He is nontoxic appearing, awake and alert, states feeling a little better today  Cardiovascular: Regular rate rhythm normal S1-S2 no murmurs rubs or gallops  Respiratory: Normal respiratory effort  Abdomen: Soft nontender nondistended  Musculoskeletal: No edema  Data Reviewed: Basic Metabolic Panel:  Recent Labs Lab 01/17/16 1534 01/18/16 0120  NA 131* 142  K 3.5 3.8  CL 98* 110  CO2 21* 25  GLUCOSE 253* 90  BUN 13 8  CREATININE 1.11 0.90  CALCIUM 8.7* 8.3*   Liver Function Tests:  Recent Labs Lab 01/17/16 1534  AST 48*  ALT 55  ALKPHOS 632*  BILITOT 0.7  PROT 6.0*  ALBUMIN 2.7*   No results for input(s): LIPASE, AMYLASE in the last 168 hours. No results for input(s): AMMONIA in the last 168 hours. CBC:  Recent Labs Lab 01/17/16 1534 01/18/16 0120  WBC 15.4* 14.8*  NEUTROABS 14.0*  --   HGB 9.8* 8.5*  HCT 30.3* 26.6*  MCV 85.1 86.9  PLT 248 201   Cardiac Enzymes: No results for input(s): CKTOTAL, CKMB, CKMBINDEX, TROPONINI in the last  168 hours. BNP (last 3 results) No results for input(s): BNP in the last 8760 hours.  ProBNP (last 3 results) No results for input(s): PROBNP in the last 8760 hours.  CBG:  Recent Labs Lab 01/17/16 1450 01/17/16 1832 01/18/16 0800 01/18/16 0828 01/18/16 1202  GLUCAP 256* 115* 49* 88 245*    Recent Results  (from the past 240 hour(s))  Blood Culture (routine x 2)     Status: None (Preliminary result)   Collection Time: 01/17/16  3:26 PM  Result Value Ref Range Status   Specimen Description BLOOD LEFT ANTECUBITAL  Final   Special Requests BOTTLES DRAWN AEROBIC AND ANAEROBIC 5CC  Final   Culture  Setup Time   Final    GRAM POSITIVE COCCI IN PAIRS IN CHAINS ANAEROBIC BOTTLE ONLY CRITICAL RESULT CALLED TO, READ BACK BY AND VERIFIED WITH: J. BOVELAL,RN AT [id number] ON [id number] BY Rhea Bleacher    Culture NO GROWTH < 24 HOURS  Final   Report Status PENDING  Incomplete  Blood Culture (routine x 2)     Status: None (Preliminary result)   Collection Time: 01/17/16  3:34 PM  Result Value Ref Range Status   Specimen Description BLOOD LEFT FOREARM  Final   Special Requests BOTTLES DRAWN AEROBIC AND ANAEROBIC 5CC  Final   Culture  Setup Time   Final    GRAM POSITIVE COCCI IN CHAINS ANAEROBIC BOTTLE ONLY CRITICAL RESULT CALLED TO, READ BACK BY AND VERIFIED WITH: J BOZEMAN,RN @0735  01/18/16 MKELLY    Culture GRAM POSITIVE COCCI  Final   Report Status PENDING  Incomplete  Urine culture     Status: None (Preliminary result)   Collection Time: 01/17/16  4:16 PM  Result Value Ref Range Status   Specimen Description URINE, CLEAN CATCH  Final   Special Requests NONE  Final   Culture NO GROWTH < 24 HOURS  Final   Report Status PENDING  Incomplete     Studies: Dg Chest Port 1 View  01/17/2016  CLINICAL DATA:  High grade fever today, patient on chemotherapy for pancreatic cancer EXAM: PORTABLE CHEST 1 VIEW COMPARISON:  None. FINDINGS: Right Port-A-Cath with tip to the cavoatrial junction. Heart size and vascular pattern normal. No consolidation or effusion. IMPRESSION: No active disease. Electronically Signed   By: Skipper Cliche M.D.   On: 01/17/2016 15:28    Scheduled Meds: . enoxaparin (LOVENOX) injection  40 mg Subcutaneous Q24H  . feeding supplement (ENSURE ENLIVE)  237 mL Oral BID BM  . insulin detemir   20 Units Subcutaneous Q breakfast  . lipase/protease/amylase  12,000 Units Oral BID AC  . [START ON 01/19/2016] lipase/protease/amylase  24,000 Units Oral Q breakfast  . loratadine  10 mg Oral Daily  . multivitamin with minerals  1 tablet Oral Daily  . piperacillin-tazobactam (ZOSYN)  IV  3.375 g Intravenous 3 times per day  . vancomycin  500 mg Intravenous Q12H   Continuous Infusions: . 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 If 7PM-7AM, please contact night-coverage at [email or web address], password [facility] 01/18/2016, 1:38 PM  LOS: 1 day

## 2016-01-18 NOTE — Progress Notes (Signed)
IP PROGRESS NOTE  Subjective:   Jacob Hardin is known to me with a history of metastatic pancreas cancer. He completed cycle 3 FOLFIRINOX 01/04/2016. He reports increased anorexia and malaise following this cycle of chemotherapy. No nausea/vomiting or diarrhea. He developed a fever on Sunday and Monday of this week. The fever was greater than 102 on Monday and responded to ibuprofen. Yesterday he developed severe rigors and [facility]. He was noted to have a high fever and was admitted with the concern for sepsis. He denies pain at the Port-A-Cath site, dysuria, and he does not have significant respiratory symptoms.  Objective: Vital signs in last 24 hours: Blood pressure 138/83, pulse 72, temperature 98.4 F (36.9 C), temperature source Oral, resp. rate 18, height 6' (1.829 m), weight 158 lb 12.8 oz (72.031 kg), SpO2 99 %.  Intake/Output from previous day: 03/28 0701 - 03/29 0700 In: 1836.7 [P.O.:120; I.V.:1216.7; IV Piggyback:500] Out: -   Physical Exam:  HEENT: No thrush, mild erythema at the tip of the tongue Lungs: Clear bilaterally Cardiac: Regular rate and rhythm Abdomen: No hepatomegaly, nontender, no mass Extremities: No leg edema   Portacath without erythema or tenderness  Lab Results:  Recent Labs  01/17/16 1534 01/18/16 0120  WBC 15.4* 14.8*  HGB 9.8* 8.5*  HCT 30.3* 26.6*  PLT 248 201    BMET  Recent Labs  01/17/16 1534 01/18/16 0120  NA 131* 142  K 3.5 3.8  CL 98* 110  CO2 21* 25  GLUCOSE 253* 90  BUN 13 8  CREATININE 1.11 0.90  CALCIUM 8.7* 8.3*  Blood cultures from 01/17/2016-positive for gram-positive cocci in chains  Studies/Results: Dg Chest Port 1 View  01/17/2016  CLINICAL DATA:  High grade fever today, patient on chemotherapy for pancreatic cancer EXAM: PORTABLE CHEST 1 VIEW COMPARISON:  None. FINDINGS: Right Port-A-Cath with tip to the cavoatrial junction. Heart size and vascular pattern normal. No  consolidation or effusion. IMPRESSION: No active disease. Electronically Signed   By: Skipper Cliche M.D.   On: 01/17/2016 15:28    Medications: I have reviewed the patient's current medications.  Assessment/Plan:  1. 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 Diabetes  3. Anorexia/weight loss secondary to #1  4. Obstructive jaundice secondary to #1-status post placement of a percutaneous internal/external biliary drain 11/25/2015; biliary drainage catheter capped 12/02/2015; biliary drain internalized 12/13/2015  5. Port-A-Cath placement 11/30/2015 interventional radiology  6.   Admission 01/17/2016 with a high fever and rigors-no apparent source for infection upon review of his history and examination  [name] has metastatic pancreas cancer. He has completed 3 cycles of FOLFIRINOX and is scheduled for cycle 4 today. He presented yesterday with a high fever and chills. No source for infection has been identified. The likely sources for infection in his case are the Port-A-Cath and hepatobiliary system.  Recommendations:  1. Continue broad-spectrum intravenous antibiotics and follow-up ID/sensitivity on the gram-positive cocci identified on the 01/17/2016 blood cultures 2. Check CA 19-9 3. Cycle 4 FOLFIRINOX will be rescheduled for  01/25/2016    LOS: 1 day   Betsy Coder, MD   01/18/2016, 9:32 AM

## 2016-01-19 ENCOUNTER — Other Ambulatory Visit: Payer: Self-pay | Admitting: *Deleted

## 2016-01-19 ENCOUNTER — Telehealth: Payer: Self-pay | Admitting: Oncology

## 2016-01-19 ENCOUNTER — Telehealth: Payer: Self-pay | Admitting: *Deleted

## 2016-01-19 LAB — URINE CULTURE: Culture: NO GROWTH

## 2016-01-19 LAB — GLUCOSE, CAPILLARY: Glucose-Capillary: 183 mg/dL — ABNORMAL HIGH (ref 65–99)

## 2016-01-19 MED ORDER — SODIUM CHLORIDE 0.9% FLUSH
10.0000 mL | INTRAVENOUS | Status: DC | PRN
  Administered 2016-01-20 (×2): 10 mL
  Filled 2016-01-19 (×2): qty 40

## 2016-01-19 NOTE — Progress Notes (Signed)
[facility] PROGRESS NOTE  Jacob Hardin [id number] DOB: [birth year] DOA: 01/17/2016 PCP: [name], MD  Assessment/Plan: 1. Sepsis -Present on admission, evidenced by fever of 105.6, heart rate of 112, white count of 15,400, blood cultures growing gram-positive cocci in chains x 2 cultures. Suspect the source of infection may be intra-abdominal given history of metastatic pancreatic cancer. -A CT scan of abdomen and pelvis performed on 01/18/2016 did not reveal evidence of an acute intra-abdominal infection. I think it is possible that he may have seeded bacteria having underlying metastatic pancreatic cancer with multiple hepatic metastasis. -Microbiology reporting gram-positive cocci in chains, awaiting identification and susceptibility testing. Repeat blood cultures were obtained on 01/19/2016. -Plan to continue current IV antimicrobial regimen with IV vancomycin and Zosyn until culture data is available.  2.  Stage IV pancreatic cancer  -Pathology confirming adenocarcinoma -Currently receiving his care at the cancer center undergoing so far 3 cycles of FOLFIRINOX -He has a history of obstructive jaundice undergoing percutaneous biliary drain placement on 11/25/2015 -His primary oncologist Dr.Sherrill consulted  3.  Diabetes mellitus. -Continue Levemir 20 units subcutaneous daily -Accu-Cheks before meals and at bedtime with sliding scale coverage   Code Status: DO NOT RESUSCITATE Family Communication: I spoke to his wife was present at bedside Disposition Plan: Awaiting culture data    Consultants:  Medical oncology  Antibiotics:  IV vancomycin  IV Zosyn  HPI/Subjective: Jacob Hardin is a 70 year old gentleman with a past medical history stage IV pancreatic cancer with liver metastasis undergoing FOLFIRINOX therapy at the cancer center, presented with fevers, chills, malaise. He was admitted to the medicine service on 01/17/2016. On presentation he was  found to have a temperature of 105.6. Initial lab work did not reveal obvious source of infection. Chest x-ray negative for acute cardiopulmonary disease. Urinalysis unremarkable. He was further worked up with CT scan of abdomen and pelvis to assess for possible intra-abdominal source of infection. Meanwhile started on broad-spectrum IV antibiotic therapy with vancomycin and Zosyn.  Objective: Filed Vitals:   01/19/16 1100 01/19/16 1400  BP: 135/75 157/74  Pulse: 70 70  Temp: 98.2 F (36.8 C) 98.6 F (37 C)  Resp:      Intake/Output Summary (Last 24 hours) at 01/19/16 1506 Last data filed at 01/19/16 1500  Gross per 24 hour  Intake   4580 ml  Output      0 ml  Net   4580 ml   Filed Weights   01/17/16 1451 01/17/16 1826  Weight: 67.246 kg (148 lb 4 oz) 72.031 kg (158 lb 12.8 oz)    Exam:   General:  Looks better, reports feeling well, ablating down the hallway  Cardiovascular: Regular rate rhythm normal S1-S2 no murmurs rubs or gallops  Respiratory: Normal respiratory effort  Abdomen: Soft nontender nondistended  Musculoskeletal: No edema  Data Reviewed: Basic Metabolic Panel:  Recent Labs Lab 01/17/16 1534 01/18/16 0120  NA 131* 142  K 3.5 3.8  CL 98* 110  CO2 21* 25  GLUCOSE 253* 90  BUN 13 8  CREATININE 1.11 0.90  CALCIUM 8.7* 8.3*   Liver Function Tests:  Recent Labs Lab 01/17/16 1534  AST 48*  ALT 55  ALKPHOS 632*  BILITOT 0.7  PROT 6.0*  ALBUMIN 2.7*   No results for input(s): LIPASE, AMYLASE in the last 168 hours. No results for input(s): AMMONIA in the last 168 hours. CBC:  Recent Labs Lab 01/17/16 1534 01/18/16 0120  WBC 15.4* 14.8*  NEUTROABS 14.0*  --  HGB 9.8* 8.5*  HCT 30.3* 26.6*  MCV 85.1 86.9  PLT 248 201   Cardiac Enzymes: No results for input(s): CKTOTAL, CKMB, CKMBINDEX, TROPONINI in the last 168 hours. BNP (last 3 results) No results for input(s): BNP in the last 8760 hours.  ProBNP (last 3 results) No results  for input(s): PROBNP in the last 8760 hours.  CBG:  Recent Labs Lab 01/18/16 0800 01/18/16 0828 01/18/16 1202 01/18/16 1654 01/19/16 0833  GLUCAP 49* 88 245* 208* 183*    Recent Results (from the past 240 hour(s))  Blood Culture (routine x 2)     Status: None (Preliminary result)   Collection Time: 01/17/16  3:26 PM  Result Value Ref Range Status   Specimen Description BLOOD LEFT ANTECUBITAL  Final   Special Requests BOTTLES DRAWN AEROBIC AND ANAEROBIC 5CC  Final   Culture  Setup Time   Final    GRAM POSITIVE COCCI IN PAIRS IN CHAINS IN BOTH AEROBIC AND ANAEROBIC BOTTLES CRITICAL RESULT CALLED TO, READ BACK BY AND VERIFIED WITH: J. BOVELAL,RN AT [id number] ON [id number] BY Rhea Bleacher    Culture   Final    GRAM POSITIVE COCCI IDENTIFICATION AND SUSCEPTIBILITIES TO FOLLOW    Report Status PENDING  Incomplete  Blood Culture (routine x 2)     Status: None (Preliminary result)   Collection Time: 01/17/16  3:34 PM  Result Value Ref Range Status   Specimen Description BLOOD LEFT FOREARM  Final   Special Requests BOTTLES DRAWN AEROBIC AND ANAEROBIC 5CC  Final   Culture  Setup Time   Final    GRAM POSITIVE COCCI IN CHAINS ANAEROBIC BOTTLE ONLY CRITICAL RESULT CALLED TO, READ BACK BY AND VERIFIED WITH: J BOZEMAN,RN @0735  01/18/16 MKELLY    Culture GRAM POSITIVE COCCI  Final   Report Status PENDING  Incomplete  Urine culture     Status: None   Collection Time: 01/17/16  4:16 PM  Result Value Ref Range Status   Specimen Description URINE, CLEAN CATCH  Final   Special Requests NONE  Final   Culture NO GROWTH 2 DAYS  Final   Report Status 01/19/2016 FINAL  Final  Culture, blood (Routine X 2) w Reflex to ID Panel     Status: None (Preliminary result)   Collection Time: 01/19/16 11:10 AM  Result Value Ref Range Status   Specimen Description BLOOD RIGHT ANTECUBITAL  Final   Special Requests IN PEDIATRIC BOTTLE 3CC  Final   Culture PENDING  Incomplete   Report Status PENDING  Incomplete      Studies: Ct Abdomen Pelvis W Contrast  01/18/2016  CLINICAL DATA:  Sepsis, follow-up, prior abnormal MRI showing a pancreatic mass with CBD obstruction and hepatic metastases EXAM: CT ABDOMEN AND PELVIS WITH CONTRAST TECHNIQUE: Multidetector CT imaging of the abdomen and pelvis was performed using the standard protocol following bolus administration of intravenous contrast. Sagittal and coronal MPR images reconstructed from axial data set. CONTRAST:  16mL OMNIPAQUE IOHEXOL 300 MG/ML SOLN IV. Dilute oral contrast. COMPARISON:  Jacob abdomen 11/15/2015 FINDINGS: Dependent atelectasis at both lung bases. Multiple poorly defined hepatic masses compatible with metastases, up to 3.1 cm in size. CBD stent extending into duodenum. Pneumobilia with additional gas within gallbladder. Atrophic pancreas with enlargement of pancreatic head corresponding to tumor seen on prior Jacob, margins poorly delineated question 3.5 x 2.4 x 5.8 cm. Mild pancreatic ductal dilatation. Spleen, adrenal glands and kidneys normal appearance. Duodenal stent present. Stomach and bowel loops normal appearance. Small poorly marginated  fluid collection medial to ascending colon 2.3 x 1.7 cm, uncertain etiology, not definitely seen on prior Jacob. Scattered atherosclerotic calcification including coronary arteries. Unremarkable bladder and ureters. Minimal free pelvic fluid. No new adenopathy, free air, hernia or acute bone lesions. IMPRESSION: Pancreatic head mass post CBD stenting and duodenal stenting. Multiple hepatic metastases. Small amount of free pelvic fluid as well as a poorly marginated collection of fluid in the mesentery medial to the RIGHT colon, nonspecific ; this does not have the appearance of a discrete marginated abscess collection at this time. Electronically Signed   By: Lavonia Dana M.D.   On: 01/18/2016 19:30   Dg Chest Port 1 View  01/17/2016  CLINICAL DATA:  High grade fever today, patient on chemotherapy for pancreatic  cancer EXAM: PORTABLE CHEST 1 VIEW COMPARISON:  None. FINDINGS: Right Port-A-Cath with tip to the cavoatrial junction. Heart size and vascular pattern normal. No consolidation or effusion. IMPRESSION: No active disease. Electronically Signed   By: Skipper Cliche M.D.   On: 01/17/2016 15:28    Scheduled Meds: . enoxaparin (LOVENOX) injection  40 mg Subcutaneous Q24H  . feeding supplement (ENSURE ENLIVE)  237 mL Oral BID BM  . insulin detemir  20 Units Subcutaneous Q breakfast  . lipase/protease/amylase  12,000 Units Oral BID AC  . lipase/protease/amylase  24,000 Units Oral Q breakfast  . loratadine  10 mg Oral Daily  . multivitamin with minerals  1 tablet Oral Daily  . piperacillin-tazobactam (ZOSYN)  IV  3.375 g Intravenous 3 times per day  . vancomycin  500 mg Intravenous Q12H   Continuous Infusions: . sodium chloride 1,000 mL (01/19/16 1058)    Principal Problem:   Sepsis ([place]) Active Problems:   Diabetes mellitus without complication ([place])   Cancer of head of pancreas ([place])   Metastatic cancer ([place])    Time spent: 25 minutes    Kelvin Cellar  [facility] Pager [phone]. If 7PM-7AM, please contact night-coverage at [email or web address], password [facility] 01/19/2016, 3:06 PM  LOS: 2 days

## 2016-01-19 NOTE — Telephone Encounter (Signed)
s.w. pt and advised on April appt °

## 2016-01-19 NOTE — Progress Notes (Signed)
Initial Nutrition Assessment  DOCUMENTATION CODES:   Non-severe (moderate) malnutrition in context of chronic illness  INTERVENTION:   -Continue Ensure Enlive po BID, each supplement provides 350 kcal and 20 grams of protein -Pt wife to bring in home supply of Premier Protein to help supplement diet  NUTRITION DIAGNOSIS:   Malnutrition related to chronic illness as evidenced by mild depletion of muscle mass, mild depletion of body fat, percent weight loss.  GOAL:   Patient will meet greater than or equal to 90% of their needs  MONITOR:   PO intake, Supplement acceptance, Labs, Weight trends, Skin, I & O's  REASON FOR ASSESSMENT:   Malnutrition Screening Tool    ASSESSMENT:   Jacob Hardin is a 70 y.o. 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  Pt admitted with sepsis. Noted pt with pancreatic cancer with liver mets. Pt is [facility] by [facility]; noted pt completed cycle 3 FOLFIRINOX on 01/04/2016. Plan to resume treatment on 01/25/16.   Spoke with pt and wife at bedside. Both confirm a general decline in health including poor appetite and weight loss since being diagnosed with pancreatic cancer and side effects of chemotherapy treatments over the past 3 months. Pt was in good spirits today and reports he had a good breakfast this morning (meal completion 25-100%). Pt reveals that this is the first meal that he enjoyed without taste changes in the last several months.   Pt has been consuming Ensure supplements, however, pt wife concerned about effects on pt's blood sugar. She reveals pt consumes 1-2 Premier Protein shakes daily PTA to optimize nutritional intake. Pt wife reports she would like to bring home supply of Premier Protein  to hospital. Pt is also followed by [facility] RD and both pt and wife report that these visits have been very helpful.   Pt reports UBW around 175#. Pt estimated a 20-25# wt loss over the past 2-3 months. Noted a 10.7% wt loss over the past 3-4 months, which is significant for time frame.   Nutrition-Focused physical exam completed. Findings are mild to moderate fat depletion, mild to moderate muscle depletion, and no edema.   Pt and wife very knowledgeable about importance of good meal and supplement intake to promote nutritional adequacy and prevent further weight loss. They have no further questions at this time and expressed appreciation for visit. Both verbalize intent to continue to follow-up with [facility] for further surveillance.  Case discussed with RN.  Labs reviewed: CBGS: 183-208.   Diet Order:  Diet Carb Modified Fluid consistency:: Thin; Room service appropriate?: Yes  Skin:  Reviewed, no issues  Last BM:  01/19/16  Height:   Ht Readings from Last 1 Encounters:  01/17/16 6' (1.829 m)    Weight:   Wt Readings from Last 1 Encounters:  01/17/16 158 lb 12.8 oz (72.031 kg)    Ideal Body Weight:  80.9 kg  BMI:  Body mass index is 21.53 kg/(m^2).  Estimated Nutritional Needs:   Kcal:  2100-2300  Protein:  95-110 grams  Fluid:  2.1-2.3 L  EDUCATION NEEDS:   Education needs addressed  Jacob Hardin, RD, LDN, CDE Pager: [phone] After hours Pager: [phone]

## 2016-01-19 NOTE — Progress Notes (Signed)
IP PROGRESS NOTE  Subjective:   Jacob Hardin reports mild discomfort in the upper abdomen.  Objective: Vital signs in last 24 hours: Blood pressure 157/74, pulse 70, temperature 98.6 F (37 C), temperature source Oral, resp. rate 16, height 6' (1.829 m), weight 158 lb 12.8 oz (72.031 kg), SpO2 99 %.  Intake/Output from previous day: 03/29 0701 - 03/30 0700 In: 4280 [P.O.:240; I.V.:3890; IV Piggyback:150] Out: -   Physical Exam:  HEENT: No thrush  Abdomen: No hepatomegaly, nontender, no mass Extremities: No leg edema   Portacath without erythema or tenderness  Lab Results:  Recent Labs  01/17/16 1534 01/18/16 0120  WBC 15.4* 14.8*  HGB 9.8* 8.5*  HCT 30.3* 26.6*  PLT 248 201    BMET  Recent Labs  01/17/16 1534 01/18/16 0120  NA 131* 142  K 3.5 3.8  CL 98* 110  CO2 21* 25  GLUCOSE 253* 90  BUN 13 8  CREATININE 1.11 0.90  CALCIUM 8.7* 8.3*  Blood cultures from 01/17/2016-positive for gram-positive cocci in chains  Studies/Results: Ct Abdomen Pelvis W Contrast  01/18/2016  CLINICAL DATA:  Sepsis, follow-up, prior abnormal MRI showing a pancreatic mass with CBD obstruction and hepatic metastases EXAM: CT ABDOMEN AND PELVIS WITH CONTRAST TECHNIQUE: Multidetector CT imaging of the abdomen and pelvis was performed using the standard protocol following bolus administration of intravenous contrast. Sagittal and coronal MPR images reconstructed from axial data set. CONTRAST:  170mL OMNIPAQUE IOHEXOL 300 MG/ML SOLN IV. Dilute oral contrast. COMPARISON:  MR abdomen 11/15/2015 FINDINGS: Dependent atelectasis at both lung bases. Multiple poorly defined hepatic masses compatible with metastases, up to 3.1 cm in size. CBD stent extending into duodenum. Pneumobilia with additional gas within gallbladder. Atrophic pancreas with enlargement of pancreatic head corresponding to tumor seen on prior MR, margins poorly delineated question 3.5 x 2.4 x 5.8 cm. Mild pancreatic ductal  dilatation. Spleen, adrenal glands and kidneys normal appearance. Duodenal stent present. Stomach and bowel loops normal appearance. Small poorly marginated fluid collection medial to ascending colon 2.3 x 1.7 cm, uncertain etiology, not definitely seen on prior MR. Scattered atherosclerotic calcification including coronary arteries. Unremarkable bladder and ureters. Minimal free pelvic fluid. No new adenopathy, free air, hernia or acute bone lesions. IMPRESSION: Pancreatic head mass post CBD stenting and duodenal stenting. Multiple hepatic metastases. Small amount of free pelvic fluid as well as a poorly marginated collection of fluid in the mesentery medial to the RIGHT colon, nonspecific ; this does not have the appearance of a discrete marginated abscess collection at this time. Electronically Signed   By: Lavonia Dana M.D.   On: 01/18/2016 19:30    Medications: I have reviewed the patient's current medications.  Assessment/Plan:  1. 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 Diabetes  3. Anorexia/weight loss secondary to #1  4. Obstructive jaundice secondary to #1-status post placement of a percutaneous internal/external biliary drain 11/25/2015; biliary drainage catheter capped 12/02/2015; biliary drain internalized 12/13/2015  5. Port-A-Cath placement 11/30/2015 interventional radiology  6.   Admission 01/17/2016 with a high fever and rigors-no apparent source for infection upon review of his history and examination, blood cultures positive for gram-positive cocci  Mr.  Jacob Hardin appears  well. He had a low-grade fever this morning. The identification of the gram-positive cocci is pending. The CT abdomen 01/18/2016 reveals no clear source for infection. I suspect the infection is related to the biliary tract or liver. I have a low clinical suspicion for a Port-A-Cath infection.  The CT confirms a pancreas mass and multiple liver metastases. I will ask for a direct comparison with the 11/15/2015 MRI scan. We checked a CA 19-9 today.    Recommendations:  1. Continue broad-spectrum intravenous antibiotics and follow-up ID/sensitivity on the gram-positive cocci identified on the 01/17/2016 blood cultures 2. Consider infectious disease consult to recommend a course of antibiotics 3. Access Port-A-Cath and draw blood culture from the Port-A-Cath 4. Please call Oncology as needed. I will check on him 01/23/2016 if he remains in the hospital 5. Cycle 4 FOLFIRINOX will be rescheduled for  01/25/2016    LOS: 2 days   Betsy Coder, MD   01/19/2016, 3:52 PM

## 2016-01-19 NOTE — Telephone Encounter (Signed)
Per staff message and POF I have scheduled appts. Advised scheduler of appts and first available given. JMW  

## 2016-01-20 ENCOUNTER — Ambulatory Visit: Payer: Medicare Other

## 2016-01-20 DIAGNOSIS — A408 Other streptococcal sepsis: Principal | ICD-10-CM

## 2016-01-20 DIAGNOSIS — C787 Secondary malignant neoplasm of liver and intrahepatic bile duct: Secondary | ICD-10-CM

## 2016-01-20 DIAGNOSIS — Z95828 Presence of other vascular implants and grafts: Secondary | ICD-10-CM

## 2016-01-20 DIAGNOSIS — R7881 Bacteremia: Secondary | ICD-10-CM

## 2016-01-20 LAB — CBC
HEMATOCRIT: 26.6 % — AB (ref 39.0–52.0)
Hemoglobin: 8.6 g/dL — ABNORMAL LOW (ref 13.0–17.0)
MCH: 27.7 pg (ref 26.0–34.0)
MCHC: 32.3 g/dL (ref 30.0–36.0)
MCV: 85.5 fL (ref 78.0–100.0)
PLATELETS: 183 10*3/uL (ref 150–400)
RBC: 3.11 MIL/uL — ABNORMAL LOW (ref 4.22–5.81)
RDW: 15.9 % — AB (ref 11.5–15.5)
WBC: 10.4 10*3/uL (ref 4.0–10.5)

## 2016-01-20 LAB — BASIC METABOLIC PANEL
Anion gap: 6 (ref 5–15)
CO2: 27 mmol/L (ref 22–32)
CREATININE: 0.68 mg/dL (ref 0.61–1.24)
Calcium: 8.1 mg/dL — ABNORMAL LOW (ref 8.9–10.3)
Chloride: 105 mmol/L (ref 101–111)
GFR calc Af Amer: >60 mL/min (ref >60)
GLUCOSE: 72 mg/dL (ref 65–99)
Potassium: 3.1 mmol/L — ABNORMAL LOW (ref 3.5–5.1)
SODIUM: 138 mmol/L (ref 135–145)

## 2016-01-20 LAB — CULTURE, BLOOD (ROUTINE X 2)

## 2016-01-20 LAB — GLUCOSE, CAPILLARY: Glucose-Capillary: 102 mg/dL — ABNORMAL HIGH (ref 65–99)

## 2016-01-20 LAB — CANCER ANTIGEN 19-9: CA 19 9: 481 U/mL — AB (ref 0–35)

## 2016-01-20 MED ORDER — POTASSIUM CHLORIDE CRYS ER 20 MEQ PO TBCR
40.0000 meq | EXTENDED_RELEASE_TABLET | Freq: Four times a day (QID) | ORAL | Status: AC
  Administered 2016-01-20 (×2): 40 meq via ORAL
  Filled 2016-01-20 (×2): qty 2

## 2016-01-20 MED ORDER — DEXTROSE 5 % IV SOLN
4.0000 10*6.[IU] | INTRAVENOUS | Status: DC
  Administered 2016-01-20 – 2016-01-21 (×7): 4 10*6.[IU] via INTRAVENOUS
  Filled 2016-01-20 (×9): qty 4

## 2016-01-20 NOTE — Progress Notes (Addendum)
[facility] PROGRESS NOTE  Jacob Hardin [id number] DOB: 09-Dec-1945 DOA: 01/17/2016 PCP: Jacob Fraction, MD  Assessment/Plan: 1. Sepsis -Present on admission, evidenced by fever of 105.6, heart rate of 112, white count of 15,400, blood cultures growing gram-positive cocci in chains x 2 cultures. Suspect the source of infection may be intra-abdominal given history of metastatic pancreatic cancer. -A CT scan of abdomen and pelvis performed on 01/18/2016 did not reveal evidence of an acute intra-abdominal infection. I think it is possible that he may have seeded bacteria having underlying metastatic pancreatic cancer with multiple hepatic metastasis. -On 01/20/2016 microbiology identified organism as  viridans streptococcus, raising the possibility of this being of GI source having history of metastatic pancreatic cancer. It is possible that bacteria may have translocated from gut having an underlying malignancy.  -Plan to follow up with transthoracic echocardiogram. B blood cultures remain negative. -Dr Jacob Hardin of infectious disease consulted, recommended changing antibiotics to IV penicillin G. Patient can be discharged on amoxicillin 500 mg 3 times a day. Would consider transesophageal echocardiogram if TTE concerning for endocarditis or repeat blood cultures turned positive.  2.  Stage IV pancreatic cancer  -Pathology confirming adenocarcinoma -Currently receiving his care at the cancer center undergoing so far 3 cycles of FOLFIRINOX -He has a history of obstructive jaundice undergoing percutaneous biliary drain placement on 11/25/2015 -His primary oncologist Jacob Hardin following  3.  Diabetes mellitus. -Continue Levemir 20 units subcutaneous daily -Accu-Cheks before meals and at bedtime with sliding scale coverage  4.  Hypokalemia -A.m. labs showing potassium of 3.1, will replace with oral potassium   Code Status: DO NOT RESUSCITATE Family Communication: I spoke to his  wife was present at bedside Disposition Plan: Awaiting culture data    Consultants:  Medical oncology  Antibiotics:  IV vancomycin  IV Zosyn  HPI/Subjective: Jacob Hardin is a 70 year old gentleman with a past medical history stage IV pancreatic cancer with liver metastasis undergoing FOLFIRINOX therapy at the cancer center, presented with fevers, chills, malaise. He was admitted to the medicine service on 01/17/2016. On presentation he was found to have a temperature of 105.6. Initial lab work did not reveal obvious source of infection. Chest x-ray negative for acute cardiopulmonary disease. Urinalysis unremarkable. He was further worked up with CT scan of abdomen and pelvis to assess for possible intra-abdominal source of infection. Meanwhile started on broad-spectrum IV antibiotic therapy with vancomycin and Zosyn.  Objective: Filed Vitals:   01/19/16 2110 01/20/16 0500  BP: 153/76 147/68  Pulse: 74 64  Temp: 99.4 F (37.4 C) 98.3 F (36.8 C)  Resp: 18 18    Intake/Output Summary (Last 24 hours) at 01/20/16 1409 Last data filed at 01/20/16 1238  Gross per 24 hour  Intake 2941.33 ml  Output      0 ml  Net 2941.33 ml   Filed Weights   01/17/16 1451 01/17/16 1826  Weight: 67.246 kg (148 lb 4 oz) 72.031 kg (158 lb 12.8 oz)    Exam:   General:  Looks better, reports feeling well, ablating down the hallway  Cardiovascular: Regular rate rhythm normal S1-S2 no murmurs rubs or gallops  Respiratory: Normal respiratory effort  Abdomen: Soft nontender nondistended  Musculoskeletal: No edema  Data Reviewed: Basic Metabolic Panel:  Recent Labs Lab 01/17/16 1534 01/18/16 0120 01/20/16 0458  NA 131* 142 138  K 3.5 3.8 3.1*  CL 98* 110 105  CO2 21* 25 27  GLUCOSE 253* 90 72  BUN 13 8 <5*  CREATININE 1.11  0.90 0.68  CALCIUM 8.7* 8.3* 8.1*   Liver Function Tests:  Recent Labs Lab 01/17/16 1534  AST 48*  ALT 55  ALKPHOS 632*  BILITOT 0.7  PROT 6.0*   ALBUMIN 2.7*   No results for input(s): LIPASE, AMYLASE in the last 168 hours. No results for input(s): AMMONIA in the last 168 hours. CBC:  Recent Labs Lab 01/17/16 1534 01/18/16 0120 01/20/16 0458  WBC 15.4* 14.8* 10.4  NEUTROABS 14.0*  --   --   HGB 9.8* 8.5* 8.6*  HCT 30.3* 26.6* 26.6*  MCV 85.1 86.9 85.5  PLT 248 201 183   Cardiac Enzymes: No results for input(s): CKTOTAL, CKMB, CKMBINDEX, TROPONINI in the last 168 hours. BNP (last 3 results) No results for input(s): BNP in the last 8760 hours.  ProBNP (last 3 results) No results for input(s): PROBNP in the last 8760 hours.  CBG:  Recent Labs Lab 01/18/16 0828 01/18/16 1202 01/18/16 1654 01/19/16 0833 01/20/16 0717  GLUCAP 88 245* 208* 183* 102*    Recent Results (from the past 240 hour(s))  Blood Culture (routine x 2)     Status: None   Collection Time: 01/17/16  3:26 PM  Result Value Ref Range Status   Specimen Description BLOOD LEFT ANTECUBITAL  Final   Special Requests BOTTLES DRAWN AEROBIC AND ANAEROBIC 5CC  Final   Culture  Setup Time   Final    GRAM POSITIVE COCCI IN PAIRS IN CHAINS IN BOTH AEROBIC AND ANAEROBIC BOTTLES CRITICAL RESULT CALLED TO, READ BACK BY AND VERIFIED WITH: J. BOVELAL,RN AT [id number] ON [id number] BY Rhea Bleacher    Culture VIRIDANS STREPTOCOCCUS  Final   Report Status 01/20/2016 FINAL  Final   Organism ID, Bacteria VIRIDANS STREPTOCOCCUS  Final      Susceptibility   Viridans streptococcus - MIC*    PENICILLIN <=0.06 SENSITIVE Sensitive     CEFTRIAXONE <=0.12 SENSITIVE Sensitive     ERYTHROMYCIN 2 RESISTANT Resistant     LEVOFLOXACIN 0.5 SENSITIVE Sensitive     VANCOMYCIN 0.5 SENSITIVE Sensitive     * VIRIDANS STREPTOCOCCUS  Blood Culture (routine x 2)     Status: None   Collection Time: 01/17/16  3:34 PM  Result Value Ref Range Status   Specimen Description BLOOD LEFT FOREARM  Final   Special Requests BOTTLES DRAWN AEROBIC AND ANAEROBIC 5CC  Final   Culture  Setup Time    Final    GRAM POSITIVE COCCI IN CHAINS ANAEROBIC BOTTLE ONLY CRITICAL RESULT CALLED TO, READ BACK BY AND VERIFIED WITH: J BOZEMAN,RN @0735  01/18/16 MKELLY    Culture   Final    VIRIDANS STREPTOCOCCUS SUSCEPTIBILITIES PERFORMED ON PREVIOUS CULTURE WITHIN THE LAST 5 DAYS.    Report Status 01/20/2016 FINAL  Final  Urine culture     Status: None   Collection Time: 01/17/16  4:16 PM  Result Value Ref Range Status   Specimen Description URINE, CLEAN CATCH  Final   Special Requests NONE  Final   Culture NO GROWTH 2 DAYS  Final   Report Status 01/19/2016 FINAL  Final  Culture, blood (Routine X 2) w Reflex to ID Panel     Status: None (Preliminary result)   Collection Time: 01/19/16 11:10 AM  Result Value Ref Range Status   Specimen Description BLOOD RIGHT ANTECUBITAL  Final   Special Requests IN PEDIATRIC BOTTLE 3CC  Final   Culture NO GROWTH < 24 HOURS  Final   Report Status PENDING  Incomplete  Culture, blood (Routine  X 2) w Reflex to ID Panel     Status: None (Preliminary result)   Collection Time: 01/19/16 12:30 PM  Result Value Ref Range Status   Specimen Description BLOOD  Final   Special Requests BOTTLES DRAWN AEROBIC AND ANAEROBIC 5CC  Final   Culture NO GROWTH < 24 HOURS  Final   Report Status PENDING  Incomplete     Studies: Ct Abdomen Pelvis W Contrast  01/19/2016  ADDENDUM REPORT: 01/19/2016 17:06 ADDENDUM: Addendum requested by Dr. [name] for comparison to prior MRI with regards to patient's metastatic pancreatic cancer. The pancreatic head mass is difficult to discretely measure but measures approximately 3.2 x 2.2 cm (series 201/image 33), previously 3.3 x 3.6 cm, mildly decreased. Multifocal hepatic metastases are similar but favored to be mildly decreased. For example: --2.1 x 1.9 cm lesion anteriorly in the right hepatic dome (series 201/image 16), previously 2.2 x 2.1 cm --1.6 x 1.4 cm lesion in the lateral segment left hepatic lobe (series 201/image 20), previously  1.7 x 1.4 cm --3.2 x 2.0 cm lesion in the posterior segment right hepatic lobe (series 201/image 27), previously 4.3 x 2.5 cm --2.5 x 2.1 cm lesion inferiorly in the right hepatic lobe (series 201/image 33), previously 2.8 x 2.1 cm Overall, the patient's metastatic pancreatic cancer is favored to be mildly improved when compared to 11/15/2015. Electronically Signed   By: Julian Hy M.D.   On: 01/19/2016 17:06  01/19/2016  CLINICAL DATA:  Sepsis, follow-up, prior abnormal MRI showing a pancreatic mass with CBD obstruction and hepatic metastases EXAM: CT ABDOMEN AND PELVIS WITH CONTRAST TECHNIQUE: Multidetector CT imaging of the abdomen and pelvis was performed using the standard protocol following bolus administration of intravenous contrast. Sagittal and coronal MPR images reconstructed from axial data set. CONTRAST:  129mL OMNIPAQUE IOHEXOL 300 MG/ML SOLN IV. Dilute oral contrast. COMPARISON:  Jacob abdomen 11/15/2015 FINDINGS: Dependent atelectasis at both lung bases. Multiple poorly defined hepatic masses compatible with metastases, up to 3.1 cm in size. CBD stent extending into duodenum. Pneumobilia with additional gas within gallbladder. Atrophic pancreas with enlargement of pancreatic head corresponding to tumor seen on prior Jacob, margins poorly delineated question 3.5 x 2.4 x 5.8 cm. Mild pancreatic ductal dilatation. Spleen, adrenal glands and kidneys normal appearance. Duodenal stent present. Stomach and bowel loops normal appearance. Small poorly marginated fluid collection medial to ascending colon 2.3 x 1.7 cm, uncertain etiology, not definitely seen on prior Jacob. Scattered atherosclerotic calcification including coronary arteries. Unremarkable bladder and ureters. Minimal free pelvic fluid. No new adenopathy, free air, hernia or acute bone lesions. IMPRESSION: Pancreatic head mass post CBD stenting and duodenal stenting. Multiple hepatic metastases. Small amount of free pelvic fluid as well as a  poorly marginated collection of fluid in the mesentery medial to the RIGHT colon, nonspecific ; this does not have the appearance of a discrete marginated abscess collection at this time. Electronically Signed: By: Lavonia Dana M.D. On: 01/18/2016 19:30    Scheduled Meds: . enoxaparin (LOVENOX) injection  40 mg Subcutaneous Q24H  . feeding supplement (ENSURE ENLIVE)  237 mL Oral BID BM  . insulin detemir  20 Units Subcutaneous Q breakfast  . lipase/protease/amylase  12,000 Units Oral BID AC  . lipase/protease/amylase  24,000 Units Oral Q breakfast  . loratadine  10 mg Oral Daily  . multivitamin with minerals  1 tablet Oral Daily  . pencillin G potassium IV  4 Million Units Intravenous 6 times per day   Continuous Infusions:  Principal Problem:   Sepsis ([place]) Active Problems:   Diabetes mellitus without complication ([place])   Cancer of head of pancreas ([place])   Metastatic cancer ([place])    Time spent: 25 minutes    Kelvin Cellar  [facility] Pager [phone]. If 7PM-7AM, please contact night-coverage at [email or web address], password [facility] 01/20/2016, 2:09 PM  LOS: 3 days

## 2016-01-20 NOTE — Care Management Important Message (Signed)
Important Message  Patient Details  Name: ALAKI ROSEL MRN: [id number] Date of Birth: January 10, 1946   Medicare Important Message Given:  Yes    Barb Merino [place] 01/20/2016, 12:24 PM

## 2016-01-20 NOTE — Consult Note (Signed)
[place] for [facility]       Reason for Consult: Strep viridans bacteremia    Referring Physician: Dr. Coralyn Pear  Principal Problem:   Sepsis [facility]) Active Problems:   Diabetes mellitus without complication ([place])   Cancer of head of pancreas ([place])   Metastatic cancer ([place])   . enoxaparin (LOVENOX) injection  40 mg Subcutaneous Q24H  . feeding supplement (ENSURE ENLIVE)  237 mL Oral BID BM  . insulin detemir  20 Units Subcutaneous Q breakfast  . lipase/protease/amylase  12,000 Units Oral BID AC  . lipase/protease/amylase  24,000 Units Oral Q breakfast  . loratadine  10 mg Oral Daily  . multivitamin with minerals  1 tablet Oral Daily  . pencillin G potassium IV  4 Million Units Intravenous 6 times per day  . potassium chloride  40 mEq Oral Q6H    Recommendations: Will narrow to penicillin IV TTE Repeat blood cultures sent TEE only if repeat blood cultures again positive or concerns on TTE Can use oral amoxicillin 500 mg three times a day for 7 days at discharge   Dr Tommy Medal will follow up tomorrow  Assessment: He has Strep viridans bacteremia, 2/2, likely GI source. Much improved since starting antibiotics.   WBC now WNL.    Antibiotics: Vancomycin and zosyn  HPI: Jacob Hardin is a 70 y.o. male with pancreatic cancer with liver metastasis, undergoing chemotherapy and now has completed 3 cycles, history of obstructive jaundice with biliary drains placed in February who came in with fever to 105, rigors that occurred rather suddenly.  Due to severe shaking, the wife thought it might be a seizure but now has grown Strep viridans in 2/2 blood cultures.  Now feeling much better.   CT abd independently reviewed and no abscess noted.    Review of Systems:  Constitutional: negative for fevers and chills Cardiovascular: negative for chest pain Gastrointestinal: negative for nausea and diarrhea Integument/breast: negative for rash All other systems reviewed  and are negative   Past Medical History  Diagnosis Date  . Hyperlipidemia   . Hypertension     recently weight lost-no meds now-running low.  . Primary pancreatic adenocarcinoma ([place])     mets to liver and duodenum  . Skin cancer     "burned off my face & head"  . PONV (postoperative nausea and vomiting)   . Type II diabetes mellitus ([place])     Social History  Substance Use Topics  . Smoking status: Never Smoker   . Smokeless tobacco: Never Used  . Alcohol Use: No    Family History  Problem Relation Age of Onset  . Diabetes Mother   . Hypertension Mother   . Cancer Father     lung 48  . Hypertension Father   . Cancer Brother     pancreatic   . Diabetes Brother   . Cancer Paternal Uncle     prostate  . Cancer Paternal Grandfather     colon    Allergies  Allergen Reactions  . Codeine Nausea Only    Physical Exam: Constitutional: in no apparent distress and alert  Filed Vitals:   01/19/16 2110 01/20/16 0500  BP: 153/76 147/68  Pulse: 74 64  Temp: 99.4 F (37.4 C) 98.3 F (36.8 C)  Resp: 18 18   EYES: anicteric ENMT: no thrush Cardiovascular: Cor RRR Respiratory: CTA B; normal respiratory effort GI: Bowel sounds are normal, liver is not enlarged, spleen is not enlarged Musculoskeletal: no  pedal edema noted Skin: negatives: no rash Hematologic: no cervical or supraclavicular lad Chest: port-a-cath with no erythema  Lab Results  Component Value Date   WBC 10.4 01/20/2016   HGB 8.6* 01/20/2016   HCT 26.6* 01/20/2016   MCV 85.5 01/20/2016   PLT 183 01/20/2016    Lab Results  Component Value Date   CREATININE 0.68 01/20/2016   BUN <5* 01/20/2016   NA 138 01/20/2016   K 3.1* 01/20/2016   CL 105 01/20/2016   CO2 27 01/20/2016    Lab Results  Component Value Date   ALT 55 01/17/2016   AST 48* 01/17/2016   ALKPHOS 632* 01/17/2016     Microbiology: Recent Results (from the past 240 hour(s))  Blood Culture (routine x 2)     Status: None    Collection Time: 01/17/16  3:26 PM  Result Value Ref Range Status   Specimen Description BLOOD LEFT ANTECUBITAL  Final   Special Requests BOTTLES DRAWN AEROBIC AND ANAEROBIC 5CC  Final   Culture  Setup Time   Final    GRAM POSITIVE COCCI IN PAIRS IN CHAINS IN BOTH AEROBIC AND ANAEROBIC BOTTLES CRITICAL RESULT CALLED TO, READ BACK BY AND VERIFIED WITH: J. BOVELAL,RN AT [id number] ON [id number] BY Rhea Bleacher    Culture VIRIDANS STREPTOCOCCUS  Final   Report Status 01/20/2016 FINAL  Final   Organism ID, Bacteria VIRIDANS STREPTOCOCCUS  Final      Susceptibility   Viridans streptococcus - MIC*    PENICILLIN <=0.06 SENSITIVE Sensitive     CEFTRIAXONE <=0.12 SENSITIVE Sensitive     ERYTHROMYCIN 2 RESISTANT Resistant     LEVOFLOXACIN 0.5 SENSITIVE Sensitive     VANCOMYCIN 0.5 SENSITIVE Sensitive     * VIRIDANS STREPTOCOCCUS  Blood Culture (routine x 2)     Status: None   Collection Time: 01/17/16  3:34 PM  Result Value Ref Range Status   Specimen Description BLOOD LEFT FOREARM  Final   Special Requests BOTTLES DRAWN AEROBIC AND ANAEROBIC 5CC  Final   Culture  Setup Time   Final    GRAM POSITIVE COCCI IN CHAINS ANAEROBIC BOTTLE ONLY CRITICAL RESULT CALLED TO, READ BACK BY AND VERIFIED WITH: J BOZEMAN,RN @0735  01/18/16 MKELLY    Culture   Final    VIRIDANS STREPTOCOCCUS SUSCEPTIBILITIES PERFORMED ON PREVIOUS CULTURE WITHIN THE LAST 5 DAYS.    Report Status 01/20/2016 FINAL  Final  Urine culture     Status: None   Collection Time: 01/17/16  4:16 PM  Result Value Ref Range Status   Specimen Description URINE, CLEAN CATCH  Final   Special Requests NONE  Final   Culture NO GROWTH 2 DAYS  Final   Report Status 01/19/2016 FINAL  Final  Culture, blood (Routine X 2) w Reflex to ID Panel     Status: None (Preliminary result)   Collection Time: 01/19/16 11:10 AM  Result Value Ref Range Status   Specimen Description BLOOD RIGHT ANTECUBITAL  Final   Special Requests IN PEDIATRIC BOTTLE 3CC  Final    Culture PENDING  Incomplete   Report Status PENDING  Incomplete    Austin Pongratz, Herbie [place], [place] [facility] [email or web address] [phone] pager  [phone] cell 01/20/2016, 10:57 AM

## 2016-01-21 ENCOUNTER — Inpatient Hospital Stay (HOSPITAL_COMMUNITY): Payer: Medicare Other

## 2016-01-21 DIAGNOSIS — Y838 Other surgical procedures as the cause of abnormal reaction of the patient, or of later complication, without mention of misadventure at the time of the procedure: Secondary | ICD-10-CM

## 2016-01-21 DIAGNOSIS — T80211D Bloodstream infection due to central venous catheter, subsequent encounter: Secondary | ICD-10-CM

## 2016-01-21 DIAGNOSIS — A491 Streptococcal infection, unspecified site: Secondary | ICD-10-CM | POA: Insufficient documentation

## 2016-01-21 DIAGNOSIS — R7881 Bacteremia: Secondary | ICD-10-CM | POA: Insufficient documentation

## 2016-01-21 DIAGNOSIS — R509 Fever, unspecified: Secondary | ICD-10-CM

## 2016-01-21 DIAGNOSIS — D899 Disorder involving the immune mechanism, unspecified: Secondary | ICD-10-CM

## 2016-01-21 DIAGNOSIS — T80219A Unspecified infection due to central venous catheter, initial encounter: Secondary | ICD-10-CM | POA: Insufficient documentation

## 2016-01-21 DIAGNOSIS — D849 Immunodeficiency, unspecified: Secondary | ICD-10-CM | POA: Insufficient documentation

## 2016-01-21 DIAGNOSIS — E440 Moderate protein-calorie malnutrition: Secondary | ICD-10-CM | POA: Insufficient documentation

## 2016-01-21 DIAGNOSIS — B954 Other streptococcus as the cause of diseases classified elsewhere: Secondary | ICD-10-CM

## 2016-01-21 LAB — BASIC METABOLIC PANEL
Anion gap: 7 (ref 5–15)
BUN: 8 mg/dL (ref 6–20)
CHLORIDE: 105 mmol/L (ref 101–111)
CO2: 26 mmol/L (ref 22–32)
CREATININE: 0.69 mg/dL (ref 0.61–1.24)
Calcium: 8.4 mg/dL — ABNORMAL LOW (ref 8.9–10.3)
GFR calc non Af Amer: >60 mL/min (ref >60)
Glucose, Bld: 167 mg/dL — ABNORMAL HIGH (ref 65–99)
Potassium: 4.1 mmol/L (ref 3.5–5.1)
Sodium: 138 mmol/L (ref 135–145)

## 2016-01-21 LAB — ECHOCARDIOGRAM COMPLETE
Height: 72 in
WEIGHTICAEL: 2540.8 [oz_av]

## 2016-01-21 LAB — GLUCOSE, CAPILLARY: Glucose-Capillary: 117 mg/dL — ABNORMAL HIGH (ref 65–99)

## 2016-01-21 MED ORDER — DEXTROSE 5 % IV SOLN
2.0000 g | INTRAVENOUS | Status: DC
  Administered 2016-01-21 – 2016-01-22 (×2): 2 g via INTRAVENOUS
  Filled 2016-01-21 (×2): qty 2

## 2016-01-21 NOTE — Progress Notes (Signed)
Called Jacob Hardin and spoke to her about case management order.

## 2016-01-21 NOTE — Progress Notes (Signed)
[facility] PROGRESS NOTE  Jacob Hardin [id number] DOB: 03/08/1946 DOA: 01/17/2016 PCP: [name], MD  Assessment/Plan: 1. Sepsis -Present on admission, evidenced by fever of 105.6, heart rate of 112, white count of 15,400, blood cultures growing gram-positive cocci in chains x 2 cultures. Suspect the source of infection may be intra-abdominal given history of metastatic pancreatic cancer. -A CT scan of abdomen and pelvis performed on 01/18/2016 did not reveal evidence of an acute intra-abdominal infection. I think it is possible that he may have seeded bacteria having underlying metastatic pancreatic cancer with multiple hepatic metastasis. -On 01/20/2016 microbiology identified organism as  viridans streptococcus, raising the possibility of this being of GI source having history of metastatic pancreatic cancer. It is possible that bacteria may have translocated from gut having an underlying malignancy.  -Plan to follow up with transthoracic echocardiogram. B blood cultures remain negative. -On 01/21/2016 I discussed case with Dr. Lucianne Lei dam of infectious disease. Transthoracic echocardiogram did not reveal evidence of endocarditis. He did recommend that third and streptococcal bacteremia be treated with IV ceftriaxone for a total of 2 weeks. -Requested home health services for home IV therapies.  2.  Stage IV pancreatic cancer  -Pathology confirming adenocarcinoma -Currently receiving his care at the cancer center undergoing so far 3 cycles of FOLFIRINOX -He has a history of obstructive jaundice undergoing percutaneous biliary drain placement on 11/25/2015 -His primary oncologist Dr.Sherrill following  3.  Diabetes mellitus. -Continue Levemir 20 units subcutaneous daily -Accu-Cheks before meals and at bedtime with sliding scale coverage  4.  Hypokalemia -Corrected with administration of oral potassium replacement   Code Status: DO NOT RESUSCITATE Family  Communication: I spoke to his wife was present at bedside Disposition Plan: Plan for patient to be discharged the next 24 hours will be going home on a 2 week course of ceftriaxone 1 g IV every 24 hours   Consultants:  Medical oncology  Antibiotics:  IV vancomycin  IV Zosyn  HPI/Subjective: [name] is a 70 year old gentleman with a past medical history stage IV pancreatic cancer with liver metastasis undergoing FOLFIRINOX therapy at the cancer center, presented with fevers, chills, malaise. He was admitted to the medicine service on 01/17/2016. On presentation he was found to have a temperature of 105.6. Initial lab work did not reveal obvious source of infection. Chest x-ray negative for acute cardiopulmonary disease. Urinalysis unremarkable. He was further worked up with CT scan of abdomen and pelvis to assess for possible intra-abdominal source of infection. Meanwhile started on broad-spectrum IV antibiotic therapy with vancomycin and Zosyn.  Objective: Filed Vitals:   01/20/16 2110 01/21/16 0519  BP: 145/82 159/79  Pulse: 79 61  Temp: 97.5 F (36.4 C) 98.1 F (36.7 C)  Resp: 18 18    Intake/Output Summary (Last 24 hours) at 01/21/16 1643 Last data filed at 01/21/16 1500  Gross per 24 hour  Intake   1270 ml  Output    675 ml  Net    595 ml   Filed Weights   01/17/16 1451 01/17/16 1826  Weight: 67.246 kg (148 lb 4 oz) 72.031 kg (158 lb 12.8 oz)    Exam:   General:  Looks better, reports feeling well, Ambulating down the hallway  Cardiovascular: Regular rate rhythm normal S1-S2 no murmurs rubs or gallops  Respiratory: Normal respiratory effort  Abdomen: Soft nontender nondistended  Musculoskeletal: No edema  Data Reviewed: Basic Metabolic Panel:  Recent Labs Lab 01/17/16 1534 01/18/16 0120 01/20/16 0458 01/21/16 0529  NA  131* 142 138 138  K 3.5 3.8 3.1* 4.1  CL 98* 110 105 105  CO2 21* 25 27 26   GLUCOSE 253* 90 72 167*  BUN 13 8 <5* 8   CREATININE 1.11 0.90 0.68 0.69  CALCIUM 8.7* 8.3* 8.1* 8.4*   Liver Function Tests:  Recent Labs Lab 01/17/16 1534  AST 48*  ALT 55  ALKPHOS 632*  BILITOT 0.7  PROT 6.0*  ALBUMIN 2.7*   No results for input(s): LIPASE, AMYLASE in the last 168 hours. No results for input(s): AMMONIA in the last 168 hours. CBC:  Recent Labs Lab 01/17/16 1534 01/18/16 0120 01/20/16 0458  WBC 15.4* 14.8* 10.4  NEUTROABS 14.0*  --   --   HGB 9.8* 8.5* 8.6*  HCT 30.3* 26.6* 26.6*  MCV 85.1 86.9 85.5  PLT 248 201 183   Cardiac Enzymes: No results for input(s): CKTOTAL, CKMB, CKMBINDEX, TROPONINI in the last 168 hours. BNP (last 3 results) No results for input(s): BNP in the last 8760 hours.  ProBNP (last 3 results) No results for input(s): PROBNP in the last 8760 hours.  CBG:  Recent Labs Lab 01/18/16 1202 01/18/16 1654 01/19/16 0833 01/20/16 0717 01/21/16 0813  GLUCAP 245* 208* 183* 102* 117*    Recent Results (from the past 240 hour(s))  Blood Culture (routine x 2)     Status: None   Collection Time: 01/17/16  3:26 PM  Result Value Ref Range Status   Specimen Description BLOOD LEFT ANTECUBITAL  Final   Special Requests BOTTLES DRAWN AEROBIC AND ANAEROBIC 5CC  Final   Culture  Setup Time   Final    GRAM POSITIVE COCCI IN PAIRS IN CHAINS IN BOTH AEROBIC AND ANAEROBIC BOTTLES CRITICAL RESULT CALLED TO, READ BACK BY AND VERIFIED WITH: J. BOVELAL,RN AT [id number] ON [id number] BY Rhea Bleacher    Culture VIRIDANS STREPTOCOCCUS  Final   Report Status 01/20/2016 FINAL  Final   Organism ID, Bacteria VIRIDANS STREPTOCOCCUS  Final      Susceptibility   Viridans streptococcus - MIC*    PENICILLIN <=0.06 SENSITIVE Sensitive     CEFTRIAXONE <=0.12 SENSITIVE Sensitive     ERYTHROMYCIN 2 RESISTANT Resistant     LEVOFLOXACIN 0.5 SENSITIVE Sensitive     VANCOMYCIN 0.5 SENSITIVE Sensitive     * VIRIDANS STREPTOCOCCUS  Blood Culture (routine x 2)     Status: None   Collection Time: 01/17/16   3:34 PM  Result Value Ref Range Status   Specimen Description BLOOD LEFT FOREARM  Final   Special Requests BOTTLES DRAWN AEROBIC AND ANAEROBIC 5CC  Final   Culture  Setup Time   Final    GRAM POSITIVE COCCI IN CHAINS ANAEROBIC BOTTLE ONLY CRITICAL RESULT CALLED TO, READ BACK BY AND VERIFIED WITH: J BOZEMAN,RN @0735  01/18/16 MKELLY    Culture   Final    VIRIDANS STREPTOCOCCUS SUSCEPTIBILITIES PERFORMED ON PREVIOUS CULTURE WITHIN THE LAST 5 DAYS.    Report Status 01/20/2016 FINAL  Final  Urine culture     Status: None   Collection Time: 01/17/16  4:16 PM  Result Value Ref Range Status   Specimen Description URINE, CLEAN CATCH  Final   Special Requests NONE  Final   Culture NO GROWTH 2 DAYS  Final   Report Status 01/19/2016 FINAL  Final  Culture, blood (Routine X 2) w Reflex to ID Panel     Status: None (Preliminary result)   Collection Time: 01/19/16 11:10 AM  Result Value Ref Range Status  Specimen Description BLOOD RIGHT ANTECUBITAL  Final   Special Requests IN PEDIATRIC BOTTLE 3CC  Final   Culture NO GROWTH 2 DAYS  Final   Report Status PENDING  Incomplete  Culture, blood (Routine X 2) w Reflex to ID Panel     Status: None (Preliminary result)   Collection Time: 01/19/16 12:30 PM  Result Value Ref Range Status   Specimen Description BLOOD RIGHT ANTECUBITAL  Final   Special Requests BOTTLES DRAWN AEROBIC AND ANAEROBIC 5CC  Final   Culture NO GROWTH 2 DAYS  Final   Report Status PENDING  Incomplete     Studies: No results found.  Scheduled Meds: . cefTRIAXone (ROCEPHIN)  IV  2 g Intravenous Q24H  . enoxaparin (LOVENOX) injection  40 mg Subcutaneous Q24H  . feeding supplement (ENSURE ENLIVE)  237 mL Oral BID BM  . insulin detemir  20 Units Subcutaneous Q breakfast  . lipase/protease/amylase  12,000 Units Oral BID AC  . lipase/protease/amylase  24,000 Units Oral Q breakfast  . loratadine  10 mg Oral Daily  . 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 If 7PM-7AM, please contact night-coverage at [email or web address], password [facility] 01/21/2016, 4:43 PM  LOS: 4 days

## 2016-01-21 NOTE — Progress Notes (Signed)
Subjective: No new complaints   Antibiotics:  Anti-infectives    Start     Dose/Rate Route Frequency Ordered Stop   01/21/16 1345  cefTRIAXone (ROCEPHIN) 2 g in dextrose 5 % 50 mL IVPB     2 g 100 mL/hr over 30 Minutes Intravenous Every 24 hours 01/21/16 1335     01/20/16 1200  penicillin G potassium 4 Million Units in dextrose 5 % 250 mL IVPB  Status:  Discontinued     4 Million Units 250 mL/hr over 60 Minutes Intravenous 6 times per day 01/20/16 1057 01/21/16 1335   01/18/16 0330  vancomycin (VANCOCIN) 500 mg in sodium chloride 0.9 % 100 mL IVPB  Status:  Discontinued     500 mg 100 mL/hr over 60 Minutes Intravenous Every 12 hours 01/17/16 1808 01/20/16 1057   01/17/16 2130  piperacillin-tazobactam (ZOSYN) IVPB 3.375 g  Status:  Discontinued     3.375 g 12.5 mL/hr over 240 Minutes Intravenous 3 times per day 01/17/16 1808 01/20/16 1057   01/17/16 1515  piperacillin-tazobactam (ZOSYN) IVPB 3.375 g     3.375 g 100 mL/hr over 30 Minutes Intravenous  Once 01/17/16 1505 01/17/16 1600   01/17/16 1515  vancomycin (VANCOCIN) IVPB 1000 mg/200 mL premix     1,000 mg 200 mL/hr over 60 Minutes Intravenous  Once 01/17/16 1505 01/17/16 1630      Medications: Scheduled Meds: . cefTRIAXone (ROCEPHIN)  IV  2 g Intravenous Q24H  . enoxaparin (LOVENOX) injection  40 mg Subcutaneous Q24H  . feeding supplement (ENSURE ENLIVE)  237 mL Oral BID BM  . insulin detemir  20 Units Subcutaneous Q breakfast  . lipase/protease/amylase  12,000 Units Oral BID AC  . lipase/protease/amylase  24,000 Units Oral Q breakfast  . loratadine  10 mg Oral Daily  . multivitamin with minerals  1 tablet Oral Daily   Continuous Infusions:  PRN Meds:.acetaminophen **OR** acetaminophen, ibuprofen, loperamide, ondansetron **OR** ondansetron (ZOFRAN) IV, prochlorperazine, sodium chloride flush    Objective: Weight change:   Intake/Output Summary (Last 24 hours) at 01/21/16 1533 Last data filed at  01/21/16 1500  Gross per 24 hour  Intake   1520 ml  Output    675 ml  Net    845 ml   Blood pressure 159/79, pulse 61, temperature 98.1 F (36.7 C), temperature source Oral, resp. rate 18, height 6' (1.829 m), weight 158 lb 12.8 oz (72.031 kg), SpO2 98 %. Temp:  [97.5 F (36.4 C)-98.1 F (36.7 C)] 98.1 F (36.7 C) (04/01 0519) Pulse Rate:  [61-79] 61 (04/01 0519) Resp:  [18] 18 (04/01 0519) BP: (145-159)/(79-82) 159/79 mmHg (04/01 0519) SpO2:  [98 %-100 %] 98 % (04/01 0519)  Physical Exam: General: Alert and awake, oriented x3, not in any acute distress. HEENT: anicteric sclera, pupils reactive to light and accommodation, EOMI CVS regular rate, normal r,  no murmur rubs or gallops Chest: clear to auscultation bilaterally, no wheezing, rales or rhonchi Abdomen: soft nontender, nondistended, normal bowel sounds, Extremities: no  clubbing or edema noted bilaterally Skin: Port-A-Cath clean Neuro: nonfocal  CBC:  CBC Latest Ref Rng 01/20/2016 01/18/2016 01/17/2016  WBC 4.0 - 10.5 K/uL 10.4 14.8(H) 15.4(H)  Hemoglobin 13.0 - 17.0 g/dL 8.6(L) 8.5(L) 9.8(L)  Hematocrit 39.0 - 52.0 % 26.6(L) 26.6(L) 30.3(L)  Platelets 150 - 400 K/uL 183 201 248       BMET  Recent Labs  01/20/16 0458 01/21/16 0529  NA 138 138  K 3.1*  4.1  CL 105 105  CO2 27 26  GLUCOSE 72 167*  BUN <5* 8  CREATININE 0.68 0.69  CALCIUM 8.1* 8.4*     Liver Panel  No results for input(s): PROT, ALBUMIN, AST, ALT, ALKPHOS, BILITOT, BILIDIR, IBILI in the last 72 hours.     Sedimentation Rate No results for input(s): ESRSEDRATE in the last 72 hours. C-Reactive Protein No results for input(s): CRP in the last 72 hours.  Micro Results: Recent Results (from the past 720 hour(s))  Blood Culture (routine x 2)     Status: None   Collection Time: 01/17/16  3:26 PM  Result Value Ref Range Status   Specimen Description BLOOD LEFT ANTECUBITAL  Final   Special Requests BOTTLES DRAWN AEROBIC AND ANAEROBIC  5CC  Final   Culture  Setup Time   Final    GRAM POSITIVE COCCI IN PAIRS IN CHAINS IN BOTH AEROBIC AND ANAEROBIC BOTTLES CRITICAL RESULT CALLED TO, READ BACK BY AND VERIFIED WITH: J. BOVELAL,RN AT [id number] ON [id number] BY Rhea Bleacher    Culture VIRIDANS STREPTOCOCCUS  Final   Report Status 01/20/2016 FINAL  Final   Organism ID, Bacteria VIRIDANS STREPTOCOCCUS  Final      Susceptibility   Viridans streptococcus - MIC*    PENICILLIN <=0.06 SENSITIVE Sensitive     CEFTRIAXONE <=0.12 SENSITIVE Sensitive     ERYTHROMYCIN 2 RESISTANT Resistant     LEVOFLOXACIN 0.5 SENSITIVE Sensitive     VANCOMYCIN 0.5 SENSITIVE Sensitive     * VIRIDANS STREPTOCOCCUS  Blood Culture (routine x 2)     Status: None   Collection Time: 01/17/16  3:34 PM  Result Value Ref Range Status   Specimen Description BLOOD LEFT FOREARM  Final   Special Requests BOTTLES DRAWN AEROBIC AND ANAEROBIC 5CC  Final   Culture  Setup Time   Final    GRAM POSITIVE COCCI IN CHAINS ANAEROBIC BOTTLE ONLY CRITICAL RESULT CALLED TO, READ BACK BY AND VERIFIED WITH: J BOZEMAN,RN @0735  01/18/16 MKELLY    Culture   Final    VIRIDANS STREPTOCOCCUS SUSCEPTIBILITIES PERFORMED ON PREVIOUS CULTURE WITHIN THE LAST 5 DAYS.    Report Status 01/20/2016 FINAL  Final  Urine culture     Status: None   Collection Time: 01/17/16  4:16 PM  Result Value Ref Range Status   Specimen Description URINE, CLEAN CATCH  Final   Special Requests NONE  Final   Culture NO GROWTH 2 DAYS  Final   Report Status 01/19/2016 FINAL  Final  Culture, blood (Routine X 2) w Reflex to ID Panel     Status: None (Preliminary result)   Collection Time: 01/19/16 11:10 AM  Result Value Ref Range Status   Specimen Description BLOOD RIGHT ANTECUBITAL  Final   Special Requests IN PEDIATRIC BOTTLE 3CC  Final   Culture NO GROWTH 2 DAYS  Final   Report Status PENDING  Incomplete  Culture, blood (Routine X 2) w Reflex to ID Panel     Status: None (Preliminary result)   Collection  Time: 01/19/16 12:30 PM  Result Value Ref Range Status   Specimen Description BLOOD RIGHT ANTECUBITAL  Final   Special Requests BOTTLES DRAWN AEROBIC AND ANAEROBIC 5CC  Final   Culture NO GROWTH 2 DAYS  Final   Report Status PENDING  Incomplete    Studies/Results: No results found.    Assessment/Plan:  INTERVAL HISTORY:   TTE negative   Principal Problem:   Sepsis ([place]) Active Problems:   Diabetes mellitus  without complication ([place])   Cancer of head of pancreas ([place])   Metastatic cancer [facility])    Jacob Hardin is a 70 y.o. male with  Pancreatic cancer, viridans streptococcal bacteremia and porta-cath. TTE is negative for vegetations  #1 Viridans streptococcal bacteremia: no endocarditis from TTE. Given that we are going to try to "treat through the Port-A-Cath" for this bloodstream infection I would prefer to do so with an IV antibiotic in the form of ceftriaxone.  Therefore I would give him 2 weeks of IV ceftriaxone which can be injected quickly through his Port-A-Cath on a daily basis.  We can see him for follow-up in 4 weeks for surveillance cultures or they can be obtained at his oncologists office  I will sign off for now  Please call with questions.    LOS: 4 days   Alcide Evener 01/21/2016, 3:33 PM

## 2016-01-21 NOTE — Care Management Note (Signed)
Case Management Note  Patient Details  Name: Jacob Hardin MRN: [id number] Date of Birth: 22-Nov-1945  Subjective/Objective:                  1. Sepsis Action/Plan: Discharge planning Expected Discharge Date:                  Expected Discharge Plan:  [place]  In-House Referral:     Discharge planning Services  CM Consult  Post Acute Care Choice:    Choice offered to:  Patient, Spouse  DME Arranged:  IV pump/equipment DME Agency:  [place]:  RN [facility] Agency:  [place]  Status of Service:  In process, will continue to follow  Medicare Important Message Given:  Yes Date Medicare IM Given:    Medicare IM give by:    Date Additional Medicare IM Given:    Additional Medicare Important Message give by:     If discussed at [place] of Stay Meetings, dates discussed:    Additional Comments: CM received call from RN as plan to go home with IV ABX and please begin arranging.  CM spoke with pt and wife who choose [facility] to render [facility] services for IV ABX and PICC line maintenance.  Referral called to [facility] rep, Tiffany.  CM requested orders and face to face and will continue to follow for progression.  Dellie Catholic, RN 01/21/2016, 3:50 PM

## 2016-01-22 LAB — GLUCOSE, CAPILLARY: GLUCOSE-CAPILLARY: 120 mg/dL — AB (ref 65–99)

## 2016-01-22 MED ORDER — DEXTROSE 5 % IV SOLN: 2.0000 g | INTRAVENOUS | Status: DC

## 2016-01-22 NOTE — Discharge Summary (Signed)
Physician Discharge Summary  JACY KRUKOWSKI [id number] DOB: Oct 08, 1946 DOA: 01/17/2016  PCP: [name], MD  Admit date: 01/17/2016 Discharge date: 01/22/2016  Time spent: 35 minutes  Recommendations for Outpatient Follow-up:  1. [name] to be treated with Ceftriaxone 2 grams IV q 24 hours x 2 weeks, anticipated stop date: February 04, 2016 for treatment of viridans streptococcal bacteremia as recommended by infectious disease. 2. Home health services set up for IV home infusion 3. Please follow-up on CBC and BMP on hospital follow-up visit   Discharge Diagnoses:  Principal Problem:   Sepsis ([place]) Active Problems:   Diabetes mellitus without complication ([place])   Cancer of head of pancreas ([place])   Metastatic cancer ([place])   Immunocompromised patient ([place])   Viridans streptococci infection   Bacteremia   Central line infection   Malnutrition of moderate degree   Discharge Condition: Stable  Diet recommendation: Heart healthy  Filed Weights   01/17/16 1451 01/17/16 1826  Weight: 67.246 kg (148 lb 4 oz) 72.031 kg (158 lb 12.8 oz)    History of present illness:  EDKER BERGSTEN is a 70 y.o. 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 Patient's wife reports that he's not been feeling well since his most recent round of chemotherapy he had a fever of 103, 2 nights ago and 102 last night. They deny any abdominal pain, nausea, vomiting, diarrhea, cough congestion, rash etc. He also denies any flulike symptoms. Today he was having severe shakes and his wife suspected that he was having seizures since gotten to [facility] emergency room was noted to have a fever of 10 63F. Rest of his vital signs were stable in the  emergency room, fever down to 103.1 after a dose of ibuprofen. 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 He was admitted to the medicine service on 01/17/2016. On presentation he was found to have a temperature of 105.6. Initial lab work did not reveal obvious source of infection. Chest x-ray negative for acute cardiopulmonary disease. Urinalysis unremarkable. He was further worked up with CT scan of abdomen and pelvis to assess for possible intra-abdominal source of infection. Meanwhile started on broad-spectrum IV antibiotic therapy with vancomycin and Zosyn.   Sepsis -Present on admission, evidenced by fever of 105.6, heart rate of 112, white count of 15,400, blood cultures growing gram-positive cocci in chains x 2 cultures. Suspect the source of infection may be intra-abdominal given history of metastatic pancreatic cancer. -A CT scan of abdomen and pelvis performed on 01/18/2016 did not reveal evidence of an acute intra-abdominal infection. I think it is possible that he may have seeded bacteria having underlying metastatic pancreatic cancer with multiple hepatic metastasis. -On 01/20/2016 microbiology identified organism as viridans streptococcus, raising the possibility of this being of GI source having history of metastatic pancreatic cancer. It is possible that bacteria may have translocated from gut having an underlying malignancy.  -Plan to follow up with transthoracic echocardiogram. B blood cultures remain negative. -On 01/21/2016 I discussed case with Dr. Lucianne Lei dam of infectious disease. Transthoracic echocardiogram did not reveal evidence of endocarditis. He  did recommend that third and streptococcal bacteremia be treated with IV ceftriaxone for a total of 2 weeks. -Requested home health services  for home IV therapies.  2. Stage IV pancreatic cancer  -Pathology confirming adenocarcinoma -Currently receiving his care at the cancer center undergoing so far 3 cycles of FOLFIRINOX -He has a history of obstructive jaundice undergoing percutaneous biliary drain placement on 11/25/2015 -Follow up with his primary oncologist Dr.Sherrill  3. Diabetes mellitus. -Continue Levemir 20 units subcutaneous daily -Accu-Cheks before meals and at bedtime with sliding scale coverage  4. Hypokalemia -Corrected with administration of oral potassium replacement   Consultations:  ID  Oncology  Discharge Exam: Filed Vitals:   01/21/16 2107 01/22/16 0515  BP: 146/76 148/76  Pulse: 76 76  Temp: 99.9 F (37.7 C) 97.4 F (36.3 C)  Resp: 18 190     General: Looks better, reports feeling well, Ambulating down the hallway  Cardiovascular: Regular rate rhythm normal S1-S2 no murmurs rubs or gallops  Respiratory: Normal respiratory effort  Abdomen: Soft nontender nondistended  Musculoskeletal: No edema  Discharge Instructions   Discharge Instructions    Call MD for:  difficulty breathing, headache or visual disturbances    Complete by:  As directed      Call MD for:  extreme fatigue    Complete by:  As directed      Call MD for:  hives    Complete by:  As directed      Call MD for:  persistant dizziness or light-headedness    Complete by:  As directed      Call MD for:  persistant nausea and vomiting    Complete by:  As directed      Call MD for:  redness, tenderness, or signs of infection (pain, swelling, redness, odor or green/yellow discharge around incision site)    Complete by:  As directed      Call MD for:  severe uncontrolled pain    Complete by:  As directed      Call MD for:  temperature >100.4    Complete by:  As directed      Call MD for:    Complete by:  As directed      Diet - low sodium heart healthy    Complete by:  As directed      Increase activity  slowly    Complete by:  As directed           Current Discharge Medication List    START taking these medications   Details  cefTRIAXone 2 g in dextrose 5 % 50 mL Inject 2 g into the vein daily. Qty: 1 Bottle, Refills: 0      CONTINUE these medications which have NOT CHANGED   Details  Insulin Detemir (LEVEMIR FLEXTOUCH) 100 UNIT/ML Pen Inject 20 Units into the skin daily at 10 pm. Qty: 15 mL, Refills: 0    lidocaine-prilocaine (EMLA) cream Apply small amount over port area 1 hour prior to treatment and cover with plastic wrap.  DO NOT RUB IN Qty: 30 g, Refills: PRN    lipase/protease/amylase (CREON) 12000 units CPEP capsule Take 2 tabs before breakfast and 1 tab before lunch and supper Qty: 120 capsule, Refills: 2   Associated Diagnoses: Cancer of head of pancreas (HCC)    loperamide (IMODIUM A-D) 2 MG tablet Take 2 mg by mouth 4 (four) times daily as needed for diarrhea or loose stools.    loratadine (CLARITIN) 10 MG tablet Take  10 mg by mouth daily. Takes couple day prior to Neulasta injection    Multiple Vitamin (MULTIVITAMIN) tablet Take 1 tablet by mouth daily. CentrumSilver    prochlorperazine (COMPAZINE) 10 MG tablet Take 1 tablet (10 mg total) by mouth every 6 (six) hours as needed for nausea or vomiting. Qty: 30 tablet, Refills: 0    baclofen (LIORESAL) 10 MG tablet Take 0.5 tablets (5 mg total) by mouth 3 (three) times daily as needed (Hiccups). Qty: 30 each, Refills: 0    BD PEN NEEDLE NANO U/F 32G X 4 MM MISC USE TO INJECT INSULIN TWICE A DAY Qty: 200 each, Refills: 3       Allergies  Allergen Reactions  . Codeine Nausea Only   Follow-up Information    Follow up with Alcide Evener, MD In 1 week.   Specialty:  Infectious Diseases   Contact information:   [address] [place] [address] [phone]       Follow up with [facility] TOM, MD In 2 weeks.   Specialty:  Family Medicine   Contact information:   [address] [place] [address] [phone]       Follow up with Betsy Coder, MD In 3 days.   Specialty:  Oncology   Contact information:   [place] [address] [phone]        The results of significant diagnostics from this hospitalization (including imaging, microbiology, ancillary and laboratory) are listed below for reference.    Significant Diagnostic Studies: Ct Abdomen Pelvis W Contrast  01/19/2016  ADDENDUM REPORT: 01/19/2016 17:06 ADDENDUM: Addendum requested by Dr. [name] for comparison to prior MRI with regards to patient's metastatic pancreatic cancer. The pancreatic head mass is difficult to discretely measure but measures approximately 3.2 x 2.2 cm (series 201/image 33), previously 3.3 x 3.6 cm, mildly decreased. Multifocal hepatic metastases are similar but favored to be mildly decreased. For example: --2.1 x 1.9 cm lesion anteriorly in the right hepatic dome (series 201/image 16), previously 2.2 x 2.1 cm --1.6 x 1.4 cm lesion in the lateral segment left hepatic lobe (series 201/image 20), previously 1.7 x 1.4 cm --3.2 x 2.0 cm lesion in the posterior segment right hepatic lobe (series 201/image 27), previously 4.3 x 2.5 cm --2.5 x 2.1 cm lesion inferiorly in the right hepatic lobe (series 201/image 33), previously 2.8 x 2.1 cm Overall, the patient's metastatic pancreatic cancer is favored to be mildly improved when compared to 11/15/2015. Electronically Signed   By: Julian Hy M.D.   On: 01/19/2016 17:06  01/19/2016  CLINICAL DATA:  Sepsis, follow-up, prior abnormal MRI showing a pancreatic mass with CBD obstruction and hepatic metastases EXAM: CT ABDOMEN AND PELVIS WITH CONTRAST TECHNIQUE: Multidetector CT imaging of the abdomen and pelvis was performed using the standard protocol following bolus administration of intravenous contrast. Sagittal and coronal MPR images reconstructed from axial data set. CONTRAST:  19mL OMNIPAQUE  IOHEXOL 300 MG/ML SOLN IV. Dilute oral contrast. COMPARISON:  MR abdomen 11/15/2015 FINDINGS: Dependent atelectasis at both lung bases. Multiple poorly defined hepatic masses compatible with metastases, up to 3.1 cm in size. CBD stent extending into duodenum. Pneumobilia with additional gas within gallbladder. Atrophic pancreas with enlargement of pancreatic head corresponding to tumor seen on prior MR, margins poorly delineated question 3.5 x 2.4 x 5.8 cm. Mild pancreatic ductal dilatation. Spleen, adrenal glands and kidneys normal appearance. Duodenal stent present. Stomach and bowel loops normal appearance.  Small poorly marginated fluid collection medial to ascending colon 2.3 x 1.7 cm, uncertain etiology, not definitely seen on prior MR. Scattered atherosclerotic calcification including coronary arteries. Unremarkable bladder and ureters. Minimal free pelvic fluid. No new adenopathy, free air, hernia or acute bone lesions. IMPRESSION: Pancreatic head mass post CBD stenting and duodenal stenting. Multiple hepatic metastases. Small amount of free pelvic fluid as well as a poorly marginated collection of fluid in the mesentery medial to the RIGHT colon, nonspecific ; this does not have the appearance of a discrete marginated abscess collection at this time. Electronically Signed: By: Lavonia Dana M.D. On: 01/18/2016 19:30   Dg Chest Port 1 View  01/17/2016  CLINICAL DATA:  High grade fever today, patient on chemotherapy for pancreatic cancer EXAM: PORTABLE CHEST 1 VIEW COMPARISON:  None. FINDINGS: Right Port-A-Cath with tip to the cavoatrial junction. Heart size and vascular pattern normal. No consolidation or effusion. IMPRESSION: No active disease. Electronically Signed   By: Skipper Cliche M.D.   On: 01/17/2016 15:28    Microbiology: Recent Results (from the past 240 hour(s))  Blood Culture (routine x 2)     Status: None   Collection Time: 01/17/16  3:26 PM  Result Value Ref Range Status   Specimen  Description BLOOD LEFT ANTECUBITAL  Final   Special Requests BOTTLES DRAWN AEROBIC AND ANAEROBIC 5CC  Final   Culture  Setup Time   Final    GRAM POSITIVE COCCI IN PAIRS IN CHAINS IN BOTH AEROBIC AND ANAEROBIC BOTTLES CRITICAL RESULT CALLED TO, READ BACK BY AND VERIFIED WITH: J. BOVELAL,RN AT [id number] ON [id number] BY Rhea Bleacher    Culture VIRIDANS STREPTOCOCCUS  Final   Report Status 01/20/2016 FINAL  Final   Organism ID, Bacteria VIRIDANS STREPTOCOCCUS  Final      Susceptibility   Viridans streptococcus - MIC*    PENICILLIN <=0.06 SENSITIVE Sensitive     CEFTRIAXONE <=0.12 SENSITIVE Sensitive     ERYTHROMYCIN 2 RESISTANT Resistant     LEVOFLOXACIN 0.5 SENSITIVE Sensitive     VANCOMYCIN 0.5 SENSITIVE Sensitive     * VIRIDANS STREPTOCOCCUS  Blood Culture (routine x 2)     Status: None   Collection Time: 01/17/16  3:34 PM  Result Value Ref Range Status   Specimen Description BLOOD LEFT FOREARM  Final   Special Requests BOTTLES DRAWN AEROBIC AND ANAEROBIC 5CC  Final   Culture  Setup Time   Final    GRAM POSITIVE COCCI IN CHAINS ANAEROBIC BOTTLE ONLY CRITICAL RESULT CALLED TO, READ BACK BY AND VERIFIED WITH: J BOZEMAN,RN @0735  01/18/16 MKELLY    Culture   Final    VIRIDANS STREPTOCOCCUS SUSCEPTIBILITIES PERFORMED ON PREVIOUS CULTURE WITHIN THE LAST 5 DAYS.    Report Status 01/20/2016 FINAL  Final  Urine culture     Status: None   Collection Time: 01/17/16  4:16 PM  Result Value Ref Range Status   Specimen Description URINE, CLEAN CATCH  Final   Special Requests NONE  Final   Culture NO GROWTH 2 DAYS  Final   Report Status 01/19/2016 FINAL  Final  Culture, blood (Routine X 2) w Reflex to ID Panel     Status: None (Preliminary result)   Collection Time: 01/19/16 11:10 AM  Result Value Ref Range Status   Specimen Description BLOOD RIGHT ANTECUBITAL  Final   Special Requests IN PEDIATRIC BOTTLE 3CC  Final   Culture NO GROWTH 2 DAYS  Final   Report Status PENDING  Incomplete  Culture, blood (Routine X 2) w Reflex to ID Panel     Status: None (Preliminary result)   Collection Time: 01/19/16 12:30 PM  Result Value Ref Range Status   Specimen Description BLOOD RIGHT ANTECUBITAL  Final   Special Requests BOTTLES DRAWN AEROBIC AND ANAEROBIC 5CC  Final   Culture NO GROWTH 2 DAYS  Final   Report Status PENDING  Incomplete     Labs: Basic Metabolic Panel:  Recent Labs Lab 01/17/16 1534 01/18/16 0120 01/20/16 0458 01/21/16 0529  NA 131* 142 138 138  K 3.5 3.8 3.1* 4.1  CL 98* 110 105 105  CO2 21* 25 27 26   GLUCOSE 253* 90 72 167*  BUN 13 8 <5* 8  CREATININE 1.11 0.90 0.68 0.69  CALCIUM 8.7* 8.3* 8.1* 8.4*   Liver Function Tests:  Recent Labs Lab 01/17/16 1534  AST 48*  ALT 55  ALKPHOS 632*  BILITOT 0.7  PROT 6.0*  ALBUMIN 2.7*   No results for input(s): LIPASE, AMYLASE in the last 168 hours. No results for input(s): AMMONIA in the last 168 hours. CBC:  Recent Labs Lab 01/17/16 1534 01/18/16 0120 01/20/16 0458  WBC 15.4* 14.8* 10.4  NEUTROABS 14.0*  --   --   HGB 9.8* 8.5* 8.6*  HCT 30.3* 26.6* 26.6*  MCV 85.1 86.9 85.5  PLT 248 201 183   Cardiac Enzymes: No results for input(s): CKTOTAL, CKMB, CKMBINDEX, TROPONINI in the last 168 hours. BNP: BNP (last 3 results) No results for input(s): BNP in the last 8760 hours.  ProBNP (last 3 results) No results for input(s): PROBNP in the last 8760 hours.  CBG:  Recent Labs Lab 01/18/16 1654 01/19/16 0833 01/20/16 0717 01/21/16 0813 01/22/16 0720  GLUCAP 208* 183* 102* 117* 120*       Signed:  Kelvin Cellar MD.  [facility] 01/22/2016, 10:08 AM

## 2016-01-22 NOTE — Progress Notes (Signed)
AVS given to patient.  IV removed.  Belongings packed.  Transportation arranged by patient. Understanding demonstrated.

## 2016-01-22 NOTE — Progress Notes (Signed)
CM met with pt and wife in room and explained pt would receive 13:00 dose of Rocephin her at the hospital and [facility] for IV ABX will begin tomorrow at pt's home.  CM notified RN, retrieved prescription, and faxed to [facility] pharmacy.  [facility] rep, Tiffany notified of discharge.  NO other CM needs were communicated.

## 2016-01-24 LAB — CULTURE, BLOOD (ROUTINE X 2)
Culture: NO GROWTH
Culture: NO GROWTH

## 2016-01-26 ENCOUNTER — Telehealth: Payer: Self-pay | Admitting: Oncology

## 2016-01-26 ENCOUNTER — Telehealth: Payer: Self-pay | Admitting: *Deleted

## 2016-01-26 ENCOUNTER — Encounter: Payer: Self-pay | Admitting: *Deleted

## 2016-01-26 ENCOUNTER — Ambulatory Visit: Payer: Medicare Other

## 2016-01-26 ENCOUNTER — Ambulatory Visit (HOSPITAL_BASED_OUTPATIENT_CLINIC_OR_DEPARTMENT_OTHER): Payer: Medicare Other | Admitting: Nurse Practitioner

## 2016-01-26 ENCOUNTER — Other Ambulatory Visit (HOSPITAL_BASED_OUTPATIENT_CLINIC_OR_DEPARTMENT_OTHER): Payer: Medicare Other

## 2016-01-26 VITALS — BP 123/63 | HR 79 | Temp 98.2°F | Resp 18 | Ht 72.0 in | Wt 156.3 lb

## 2016-01-26 DIAGNOSIS — C250 Malignant neoplasm of head of pancreas: Secondary | ICD-10-CM

## 2016-01-26 DIAGNOSIS — C799 Secondary malignant neoplasm of unspecified site: Secondary | ICD-10-CM

## 2016-01-26 DIAGNOSIS — E119 Type 2 diabetes mellitus without complications: Secondary | ICD-10-CM

## 2016-01-26 DIAGNOSIS — A491 Streptococcal infection, unspecified site: Secondary | ICD-10-CM

## 2016-01-26 DIAGNOSIS — B954 Other streptococcus as the cause of diseases classified elsewhere: Secondary | ICD-10-CM | POA: Diagnosis not present

## 2016-01-26 DIAGNOSIS — Z452 Encounter for adjustment and management of vascular access device: Secondary | ICD-10-CM

## 2016-01-26 DIAGNOSIS — Z95828 Presence of other vascular implants and grafts: Secondary | ICD-10-CM

## 2016-01-26 LAB — CBC WITH DIFFERENTIAL/PLATELET
BASO%: 0.9 % (ref 0.0–2.0)
Basophils Absolute: 0.1 10*3/uL (ref 0.0–0.1)
EOS ABS: 0.1 10*3/uL (ref 0.0–0.5)
EOS%: 1.3 % (ref 0.0–7.0)
HEMATOCRIT: 32.6 % — AB (ref 38.4–49.9)
HEMOGLOBIN: 10.5 g/dL — AB (ref 13.0–17.1)
LYMPH#: 1.2 10*3/uL (ref 0.9–3.3)
LYMPH%: 10.7 % — AB (ref 14.0–49.0)
MCH: 28 pg (ref 27.2–33.4)
MCHC: 32.1 g/dL (ref 32.0–36.0)
MCV: 87 fL (ref 79.3–98.0)
MONO#: 1.1 10*3/uL — AB (ref 0.1–0.9)
MONO%: 9.6 % (ref 0.0–14.0)
NEUT%: 77.5 % — ABNORMAL HIGH (ref 39.0–75.0)
NEUTROS ABS: 9 10*3/uL — AB (ref 1.5–6.5)
PLATELETS: 383 10*3/uL (ref 140–400)
RBC: 3.75 10*6/uL — ABNORMAL LOW (ref 4.20–5.82)
RDW: 17.6 % — ABNORMAL HIGH (ref 11.0–14.6)
WBC: 11.6 10*3/uL — AB (ref 4.0–10.3)

## 2016-01-26 LAB — COMPREHENSIVE METABOLIC PANEL
ALBUMIN: 2.9 g/dL — AB (ref 3.5–5.0)
ALT: 50 U/L (ref 0–55)
ANION GAP: 10 meq/L (ref 3–11)
AST: 46 U/L — ABNORMAL HIGH (ref 5–34)
Alkaline Phosphatase: 631 U/L — ABNORMAL HIGH (ref 40–150)
BILIRUBIN TOTAL: 0.66 mg/dL (ref 0.20–1.20)
BUN: 16.6 mg/dL (ref 7.0–26.0)
CALCIUM: 9.6 mg/dL (ref 8.4–10.4)
CO2: 26 mEq/L (ref 22–29)
Chloride: 100 mEq/L (ref 98–109)
Creatinine: 1 mg/dL (ref 0.7–1.3)
EGFR: 78 mL/min/{1.73_m2} — AB (ref >90)
Glucose: 252 mg/dl — ABNORMAL HIGH (ref 70–140)
Potassium: 4.9 mEq/L (ref 3.5–5.1)
Sodium: 136 mEq/L (ref 136–145)
TOTAL PROTEIN: 7.2 g/dL (ref 6.4–8.3)

## 2016-01-26 MED ORDER — HEPARIN SOD (PORK) LOCK FLUSH 100 UNIT/ML IV SOLN
500.0000 [IU] | Freq: Once | INTRAVENOUS | Status: AC
  Administered 2016-01-26: 500 [IU] via INTRAVENOUS
  Filled 2016-01-26: qty 5

## 2016-01-26 MED ORDER — SODIUM CHLORIDE 0.9% FLUSH
10.0000 mL | INTRAVENOUS | Status: DC | PRN
  Administered 2016-01-26: 10 mL via INTRAVENOUS
  Filled 2016-01-26: qty 10

## 2016-01-26 NOTE — Telephone Encounter (Signed)
per pfo to sch pt appt*gave pt copy of avs-MW sch trmt

## 2016-01-26 NOTE — Telephone Encounter (Signed)
Per staff message and POF I have scheduled appts. Advised scheduler of appts. JMW  

## 2016-01-26 NOTE — Progress Notes (Signed)
Oncology Nurse Navigator Documentation  Oncology Nurse Navigator Flowsheets 01/26/2016  Navigator Location [facility]  Navigator Encounter Type Follow-up Appt  Abnormal Finding Date 11/11/2015  Confirmed Diagnosis Date 11/18/2015  Treatment Initiated Date -  Patient Visit Type MedOnc  Treatment Phase Other--Hold this week due to IV antibiotics continuing  Barriers/Navigation Needs No barriers at this time;No Questions;No Needs  Education -  Interventions None required  Education Method -  Support Groups/Services -  Acuity Level 1  Time Spent with Patient 37  Wife doing well with administration of IV antibiotic and flushing PAC line afterwards. Will delay tx till next week. He reports getting his appetite back and feeling better. [facility] providing supplies for home infusions. Started having significant thinning of hair after 2nd tx-has shaved his head now.

## 2016-01-26 NOTE — Progress Notes (Addendum)
  [facility] OFFICE PROGRESS NOTE   Diagnosis:  Pancreas cancer  INTERVAL HISTORY:   Jacob Hardin returns for follow-up. He completed cycle 3 FOLFIRINOX 01/04/2016. He was hospitalized 01/17/2016 with fever. Blood cultures returned positive for viridans streptococcus. He is completing 2 weeks of IV antibiotics. He was discharged home 01/22/2016.  He is feeling better. No fever. No nausea or vomiting. No mouth sores. No diarrhea. No numbness or tingling in his hands or feet.  Objective:  Vital signs in last 24 hours:  Blood pressure 123/63, pulse 79, temperature 98.2 F (36.8 C), temperature source Oral, resp. rate 18, height 6' (1.829 m), weight 156 lb 4.8 oz (70.897 kg), SpO2 99 %.    HEENT: No thrush or ulcers. Resp: Lungs clear bilaterally. Cardio: Regular rate and rhythm. GI: Abdomen soft and nontender. No hepatomegaly. Vascular: No leg edema. Calves soft and nontender. Neuro: Vibratory sense moderately decreased over the fingertips per tuning fork exam.  Skin: No rash. Port-A-Cath without erythema.    Lab Results:  Lab Results  Component Value Date   WBC 11.6* 01/26/2016   HGB 10.5* 01/26/2016   HCT 32.6* 01/26/2016   MCV 87.0 01/26/2016   PLT 383 01/26/2016   NEUTROABS 9.0* 01/26/2016    Imaging:  No results found.  Medications: I have reviewed the patient's current medications.  Assessment/Plan: 1. 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  2.  Diabetes  3. Anorexia/weight loss secondary to #1  4. Obstructive jaundice secondary to #1-status post placement of a percutaneous internal/external biliary drain 11/25/2015; biliary drainage catheter capped 12/02/2015; biliary drain internalized 12/13/2015  5. Port-A-Cath placement 11/30/2015 interventional radiology  6. Admission 01/17/2016 with a high fever and rigors-no apparent source for infection upon review of his history and examination, blood cultures positive for gram-positive cocci--viridans streptococcus--completing 2 weeks of IV ceftriaxone; no endocarditis on TTE.  Disposition: Jacob Hardin is recovering from the recent hospitalization with viridans streptococcus bacteremia. He is completing a two-week course of IV antibiotics.  He has completed 3 cycles of FOLFIRINOX. CT abdomen/pelvis on 01/19/2016 showed improvement. The plan is to proceed with cycle 4 on 02/01/2016. We will see him in follow-up prior to treatment that day. He will contact the office in the interim with any problems.  Patient seen with Dr. [name].    [name] ANP/GNP-BC   01/26/2016  8:53 AM  This was a shared visit with [name]. Jacob Hardin is recovering from the admission with strep bacteremia. We will hold the next cycle of FOLFIRINOX for one week.  [name], M.D.

## 2016-01-31 ENCOUNTER — Other Ambulatory Visit: Payer: Self-pay | Admitting: *Deleted

## 2016-02-01 ENCOUNTER — Encounter: Payer: Self-pay | Admitting: *Deleted

## 2016-02-01 ENCOUNTER — Other Ambulatory Visit (HOSPITAL_BASED_OUTPATIENT_CLINIC_OR_DEPARTMENT_OTHER): Payer: Medicare Other

## 2016-02-01 ENCOUNTER — Telehealth: Payer: Self-pay | Admitting: Oncology

## 2016-02-01 ENCOUNTER — Ambulatory Visit (HOSPITAL_BASED_OUTPATIENT_CLINIC_OR_DEPARTMENT_OTHER): Payer: Medicare Other

## 2016-02-01 ENCOUNTER — Ambulatory Visit: Payer: Medicare Other | Admitting: Nutrition

## 2016-02-01 ENCOUNTER — Ambulatory Visit (HOSPITAL_BASED_OUTPATIENT_CLINIC_OR_DEPARTMENT_OTHER): Payer: Medicare Other | Admitting: Oncology

## 2016-02-01 VITALS — BP 115/59 | HR 65 | Temp 97.7°F | Resp 18 | Ht 72.0 in | Wt 155.4 lb

## 2016-02-01 DIAGNOSIS — C787 Secondary malignant neoplasm of liver and intrahepatic bile duct: Secondary | ICD-10-CM

## 2016-02-01 DIAGNOSIS — C250 Malignant neoplasm of head of pancreas: Secondary | ICD-10-CM

## 2016-02-01 DIAGNOSIS — E119 Type 2 diabetes mellitus without complications: Secondary | ICD-10-CM

## 2016-02-01 DIAGNOSIS — Z452 Encounter for adjustment and management of vascular access device: Secondary | ICD-10-CM | POA: Diagnosis not present

## 2016-02-01 DIAGNOSIS — Z5111 Encounter for antineoplastic chemotherapy: Secondary | ICD-10-CM | POA: Diagnosis not present

## 2016-02-01 DIAGNOSIS — Z95828 Presence of other vascular implants and grafts: Secondary | ICD-10-CM

## 2016-02-01 LAB — CBC WITH DIFFERENTIAL/PLATELET
BASO%: 1.4 % (ref 0.0–2.0)
Basophils Absolute: 0.1 10*3/uL (ref 0.0–0.1)
EOS ABS: 0.2 10*3/uL (ref 0.0–0.5)
EOS%: 2.9 % (ref 0.0–7.0)
HEMATOCRIT: 31 % — AB (ref 38.4–49.9)
HGB: 9.9 g/dL — ABNORMAL LOW (ref 13.0–17.1)
LYMPH#: 1.4 10*3/uL (ref 0.9–3.3)
LYMPH%: 16.8 % (ref 14.0–49.0)
MCH: 28 pg (ref 27.2–33.4)
MCHC: 31.9 g/dL — ABNORMAL LOW (ref 32.0–36.0)
MCV: 87.8 fL (ref 79.3–98.0)
MONO#: 0.7 10*3/uL (ref 0.1–0.9)
MONO%: 8.6 % (ref 0.0–14.0)
NEUT%: 70.3 % (ref 39.0–75.0)
NEUTROS ABS: 5.7 10*3/uL (ref 1.5–6.5)
PLATELETS: 339 10*3/uL (ref 140–400)
RBC: 3.53 10*6/uL — ABNORMAL LOW (ref 4.20–5.82)
RDW: 16.6 % — ABNORMAL HIGH (ref 11.0–14.6)
WBC: 8.1 10*3/uL (ref 4.0–10.3)

## 2016-02-01 LAB — COMPREHENSIVE METABOLIC PANEL
ALK PHOS: 506 U/L — AB (ref 40–150)
ALT: 49 U/L (ref 0–55)
ANION GAP: 9 meq/L (ref 3–11)
AST: 51 U/L — ABNORMAL HIGH (ref 5–34)
Albumin: 2.9 g/dL — ABNORMAL LOW (ref 3.5–5.0)
BILIRUBIN TOTAL: 0.48 mg/dL (ref 0.20–1.20)
BUN: 17.1 mg/dL (ref 7.0–26.0)
CALCIUM: 9.4 mg/dL (ref 8.4–10.4)
CO2: 28 mEq/L (ref 22–29)
CREATININE: 1 mg/dL (ref 0.7–1.3)
Chloride: 102 mEq/L (ref 98–109)
EGFR: 81 mL/min/{1.73_m2} — AB (ref >90)
Glucose: 231 mg/dl — ABNORMAL HIGH (ref 70–140)
Potassium: 4 mEq/L (ref 3.5–5.1)
Sodium: 138 mEq/L (ref 136–145)
Total Protein: 6.9 g/dL (ref 6.4–8.3)

## 2016-02-01 MED ORDER — DEXTROSE 5 % IV SOLN
Freq: Once | INTRAVENOUS | Status: AC
  Administered 2016-02-01: 11:00:00 via INTRAVENOUS

## 2016-02-01 MED ORDER — SODIUM CHLORIDE 0.9 % IV SOLN
Freq: Once | INTRAVENOUS | Status: AC
  Administered 2016-02-01: 11:00:00 via INTRAVENOUS

## 2016-02-01 MED ORDER — PALONOSETRON HCL INJECTION 0.25 MG/5ML
INTRAVENOUS | Status: AC
  Filled 2016-02-01: qty 5

## 2016-02-01 MED ORDER — OXALIPLATIN CHEMO INJECTION 100 MG/20ML
85.0000 mg/m2 | Freq: Once | INTRAVENOUS | Status: AC
  Administered 2016-02-01: 165 mg via INTRAVENOUS
  Filled 2016-02-01: qty 33

## 2016-02-01 MED ORDER — LEUCOVORIN CALCIUM INJECTION 350 MG
400.0000 mg/m2 | Freq: Once | INTRAVENOUS | Status: AC
  Administered 2016-02-01: 780 mg via INTRAVENOUS
  Filled 2016-02-01: qty 39

## 2016-02-01 MED ORDER — SODIUM CHLORIDE 0.9 % IV SOLN
2400.0000 mg/m2 | INTRAVENOUS | Status: DC
  Administered 2016-02-01: 4700 mg via INTRAVENOUS
  Filled 2016-02-01: qty 94

## 2016-02-01 MED ORDER — SODIUM CHLORIDE 0.9% FLUSH
10.0000 mL | INTRAVENOUS | Status: DC | PRN
  Administered 2016-02-01: 10 mL via INTRAVENOUS
  Filled 2016-02-01: qty 10

## 2016-02-01 MED ORDER — ATROPINE SULFATE 1 MG/ML IJ SOLN
0.5000 mg | Freq: Once | INTRAMUSCULAR | Status: AC | PRN
  Administered 2016-02-01: 0.5 mg via INTRAVENOUS

## 2016-02-01 MED ORDER — FOSAPREPITANT DIMEGLUMINE INJECTION 150 MG
Freq: Once | INTRAVENOUS | Status: AC
  Administered 2016-02-01: 11:00:00 via INTRAVENOUS
  Filled 2016-02-01: qty 5

## 2016-02-01 MED ORDER — PALONOSETRON HCL INJECTION 0.25 MG/5ML
0.2500 mg | Freq: Once | INTRAVENOUS | Status: AC
  Administered 2016-02-01: 0.25 mg via INTRAVENOUS

## 2016-02-01 MED ORDER — ATROPINE SULFATE 1 MG/ML IJ SOLN
INTRAMUSCULAR | Status: AC
  Filled 2016-02-01: qty 1

## 2016-02-01 MED ORDER — IRINOTECAN HCL CHEMO INJECTION 100 MG/5ML
174.0000 mg/m2 | Freq: Once | INTRAVENOUS | Status: AC
  Administered 2016-02-01: 340 mg via INTRAVENOUS
  Filled 2016-02-01: qty 5.67

## 2016-02-01 NOTE — Patient Instructions (Signed)
[facility] Discharge Instructions for Patients Receiving Chemotherapy  Today you received the following chemotherapy agents : Oxaliplatin, Camptosar, Leucovorin, Fluorouracil.  To help prevent nausea and vomiting after your treatment, we encourage you to take your nausea medication as prescribed.   If you develop nausea and vomiting that is not controlled by your nausea medication, call the clinic.   BELOW ARE SYMPTOMS THAT SHOULD BE REPORTED IMMEDIATELY:  *FEVER GREATER THAN 100.5 F  *CHILLS WITH OR WITHOUT FEVER  NAUSEA AND VOMITING THAT IS NOT CONTROLLED WITH YOUR NAUSEA MEDICATION  *UNUSUAL SHORTNESS OF BREATH  *UNUSUAL BRUISING OR BLEEDING  TENDERNESS IN MOUTH AND THROAT WITH OR WITHOUT PRESENCE OF ULCERS  *URINARY PROBLEMS  *BOWEL PROBLEMS  UNUSUAL RASH Items with * indicate a potential emergency and should be followed up as soon as possible.  Feel free to call the clinic you have any questions or concerns. The clinic phone number is [phone].  Please show the [facility] at check-in to the Emergency Department and triage nurse.

## 2016-02-01 NOTE — Telephone Encounter (Signed)
Gave pt appt for may & avs °

## 2016-02-01 NOTE — Progress Notes (Signed)
Nutrition follow-up completed with patient and wife in the infusion area for chemotherapy for stage IV pancreas cancer. Patient reports he feels well and he attributes this to a break from chemotherapy. Patient denies all nutrition impact symptoms. Patient has increased premier protein shakes to twice daily. Weight has decreased and was documented as 155.0 pounds April 12, down from 159.5 pounds March 15.  Nutrition diagnosis:  Unintended weight loss continues.  Intervention:  Provided support and encouragement for patient to continue increased calories and protein throughout the day. Patient should continue premier protein shakes twice a day. Teach back method used.  Monitoring, evaluation, goals:  Patient will work to increase oral intake to promote weight stabilization.  Next visit: Wednesday, May 10, during infusion.  **Disclaimer: This note was dictated with voice recognition software. Similar sounding words can inadvertently be transcribed and this note may contain transcription errors which may not have been corrected upon publication of note.**

## 2016-02-01 NOTE — Patient Instructions (Signed)

## 2016-02-01 NOTE — Progress Notes (Signed)
Pt saw Dr. [name] today prior to chemo.  OK to treat with all lab results today as per Dr. [name].

## 2016-02-01 NOTE — Progress Notes (Signed)
Oncology Nurse Navigator Documentation  Oncology Nurse Navigator Flowsheets 02/01/2016  Navigator Location [facility]  Navigator Encounter Type Treatment  Abnormal Finding Date -  Confirmed Diagnosis Date -  Treatment Initiated Date -  Patient Visit Type MedOnc  Treatment Phase Active Tx--FOLFIRINOX # 4  Barriers/Navigation Needs No barriers at this time;No Questions;No Needs  Education -  Interventions None required  Education Method -  Support Groups/Services -  Acuity -  Time Spent with Patient 15  Feeling back to his baseline today.Will resume chemo-wife doing well with administration of his antibiotic daily.

## 2016-02-01 NOTE — Progress Notes (Signed)
  [facility] OFFICE PROGRESS NOTE   Diagnosis: Pancreas cancer  INTERVAL HISTORY:   Jacob Hardin returns as scheduled. He feels well. No fever or chills. His energy level and appetite are improved. No complaint. He is completing a course of outpatient ceftriaxone. No neuropathy symptoms.  Objective:  Vital signs in last 24 hours:  Blood pressure 115/59, pulse 65, temperature 97.7 F (36.5 C), temperature source Oral, resp. rate 18, height 6' (1.829 m), weight 155 lb 6.4 oz (70.489 kg), SpO2 99 %.    HEENT: No thrush or ulcers Resp: Lungs clear bilaterally Cardio: Regular rate and rhythm GI: No hepatomegaly, no mass, nontender Vascular: No leg edema     Portacath/PICC-without erythema  Lab Results:  Lab Results  Component Value Date   WBC 8.1 02/01/2016   HGB 9.9* 02/01/2016   HCT 31.0* 02/01/2016   MCV 87.8 02/01/2016   PLT 339 02/01/2016   NEUTROABS 5.7 02/01/2016     Medications: I have reviewed the patient's current medications.  Assessment/Plan: 1. 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  Cycle 4 FOLFIRINOX 02/01/2016  2. Diabetes  3. History of Anorexia/weight loss secondary to #1  4. Obstructive jaundice secondary to #1-status post placement of a percutaneous internal/external biliary drain 11/25/2015; biliary drainage catheter capped 12/02/2015; biliary drain internalized 12/13/2015  5. Port-A-Cath placement 11/30/2015 interventional  radiology  6. Admission 01/17/2016 with a high fever and rigors-no apparent source for infection upon review of his history and examination, blood cultures positive for gram-positive cocci--viridans streptococcus--completed 2 weeks of IV ceftriaxone; no endocarditis on TTE.    Disposition:  Mr. Gibas appears well. He appears stable to resume chemotherapy today. He will complete cycle 4 of FOLFIRINOX today.  Mr. Maruca will return for an office visit and chemotherapy in 2 weeks. He will contact us for recurrent fever. He will complete the course of IV ceftriaxone on 02/04/2016.  Betsy Coder, MD  02/01/2016  10:24 AM

## 2016-02-03 ENCOUNTER — Ambulatory Visit (HOSPITAL_BASED_OUTPATIENT_CLINIC_OR_DEPARTMENT_OTHER): Payer: Medicare Other

## 2016-02-03 ENCOUNTER — Other Ambulatory Visit: Payer: Self-pay | Admitting: Medical Oncology

## 2016-02-03 ENCOUNTER — Ambulatory Visit: Payer: Medicare Other

## 2016-02-03 DIAGNOSIS — C250 Malignant neoplasm of head of pancreas: Secondary | ICD-10-CM

## 2016-02-03 DIAGNOSIS — Z452 Encounter for adjustment and management of vascular access device: Secondary | ICD-10-CM | POA: Diagnosis not present

## 2016-02-03 DIAGNOSIS — Z5189 Encounter for other specified aftercare: Secondary | ICD-10-CM

## 2016-02-03 MED ORDER — HEPARIN SOD (PORK) LOCK FLUSH 100 UNIT/ML IV SOLN
500.0000 [IU] | Freq: Once | INTRAVENOUS | Status: DC | PRN
  Filled 2016-02-03: qty 5

## 2016-02-03 MED ORDER — PEGFILGRASTIM INJECTION 6 MG/0.6ML ~~LOC~~
6.0000 mg | PREFILLED_SYRINGE | Freq: Once | SUBCUTANEOUS | Status: AC
  Administered 2016-02-03: 6 mg via SUBCUTANEOUS
  Filled 2016-02-03: qty 0.6

## 2016-02-03 MED ORDER — SODIUM CHLORIDE 0.9% FLUSH
10.0000 mL | INTRAVENOUS | Status: DC | PRN
Start: 2016-02-03 — End: 2016-02-03
  Administered 2016-02-03: 10 mL
  Filled 2016-02-03: qty 10

## 2016-02-03 NOTE — Progress Notes (Signed)
Neulasata injection given by the flush nurse after home infusion pump disconnected.

## 2016-02-03 NOTE — Patient Instructions (Signed)

## 2016-02-06 ENCOUNTER — Other Ambulatory Visit: Payer: Self-pay | Admitting: Nurse Practitioner

## 2016-02-06 DIAGNOSIS — C250 Malignant neoplasm of head of pancreas: Secondary | ICD-10-CM

## 2016-02-12 ENCOUNTER — Other Ambulatory Visit: Payer: Self-pay | Admitting: Oncology

## 2016-02-14 ENCOUNTER — Other Ambulatory Visit: Payer: Self-pay

## 2016-02-14 DIAGNOSIS — Z95828 Presence of other vascular implants and grafts: Secondary | ICD-10-CM | POA: Insufficient documentation

## 2016-02-15 ENCOUNTER — Ambulatory Visit (HOSPITAL_BASED_OUTPATIENT_CLINIC_OR_DEPARTMENT_OTHER): Payer: Medicare Other

## 2016-02-15 ENCOUNTER — Ambulatory Visit (HOSPITAL_BASED_OUTPATIENT_CLINIC_OR_DEPARTMENT_OTHER): Payer: Medicare Other | Admitting: Oncology

## 2016-02-15 ENCOUNTER — Ambulatory Visit: Payer: Medicare Other

## 2016-02-15 ENCOUNTER — Telehealth: Payer: Self-pay | Admitting: Oncology

## 2016-02-15 ENCOUNTER — Other Ambulatory Visit (HOSPITAL_BASED_OUTPATIENT_CLINIC_OR_DEPARTMENT_OTHER): Payer: Medicare Other

## 2016-02-15 VITALS — BP 133/72 | HR 78 | Temp 98.5°F | Resp 18 | Ht 72.0 in | Wt 155.7 lb

## 2016-02-15 DIAGNOSIS — R740 Nonspecific elevation of levels of transaminase and lactic acid dehydrogenase [LDH]: Secondary | ICD-10-CM | POA: Diagnosis not present

## 2016-02-15 DIAGNOSIS — C250 Malignant neoplasm of head of pancreas: Secondary | ICD-10-CM

## 2016-02-15 DIAGNOSIS — Z5111 Encounter for antineoplastic chemotherapy: Secondary | ICD-10-CM | POA: Diagnosis not present

## 2016-02-15 DIAGNOSIS — G620 Drug-induced polyneuropathy: Secondary | ICD-10-CM | POA: Diagnosis not present

## 2016-02-15 DIAGNOSIS — C799 Secondary malignant neoplasm of unspecified site: Secondary | ICD-10-CM

## 2016-02-15 DIAGNOSIS — C787 Secondary malignant neoplasm of liver and intrahepatic bile duct: Secondary | ICD-10-CM | POA: Diagnosis not present

## 2016-02-15 DIAGNOSIS — A491 Streptococcal infection, unspecified site: Secondary | ICD-10-CM

## 2016-02-15 DIAGNOSIS — Z95828 Presence of other vascular implants and grafts: Secondary | ICD-10-CM

## 2016-02-15 LAB — COMPREHENSIVE METABOLIC PANEL
ALBUMIN: 3.3 g/dL — AB (ref 3.5–5.0)
ALK PHOS: 523 U/L — AB (ref 40–150)
ALT: 127 U/L — ABNORMAL HIGH (ref 0–55)
AST: 134 U/L — ABNORMAL HIGH (ref 5–34)
Anion Gap: 8 mEq/L (ref 3–11)
BUN: 12.2 mg/dL (ref 7.0–26.0)
CHLORIDE: 104 meq/L (ref 98–109)
CO2: 25 meq/L (ref 22–29)
Calcium: 9.3 mg/dL (ref 8.4–10.4)
Creatinine: 0.8 mg/dL (ref 0.7–1.3)
GLUCOSE: 174 mg/dL — AB (ref 70–140)
POTASSIUM: 3.8 meq/L (ref 3.5–5.1)
SODIUM: 138 meq/L (ref 136–145)
Total Bilirubin: 0.76 mg/dL (ref 0.20–1.20)
Total Protein: 6.5 g/dL (ref 6.4–8.3)

## 2016-02-15 LAB — CBC WITH DIFFERENTIAL/PLATELET
BASO%: 0.4 % (ref 0.0–2.0)
BASOS ABS: 0 10*3/uL (ref 0.0–0.1)
EOS ABS: 0.3 10*3/uL (ref 0.0–0.5)
EOS%: 3 % (ref 0.0–7.0)
HCT: 32.3 % — ABNORMAL LOW (ref 38.4–49.9)
HGB: 10.5 g/dL — ABNORMAL LOW (ref 13.0–17.1)
LYMPH%: 11.2 % — AB (ref 14.0–49.0)
MCH: 28.6 pg (ref 27.2–33.4)
MCHC: 32.5 g/dL (ref 32.0–36.0)
MCV: 88 fL (ref 79.3–98.0)
MONO#: 0.8 10*3/uL (ref 0.1–0.9)
MONO%: 8.4 % (ref 0.0–14.0)
NEUT#: 7.2 10*3/uL — ABNORMAL HIGH (ref 1.5–6.5)
NEUT%: 77 % — AB (ref 39.0–75.0)
Platelets: 177 10*3/uL (ref 140–400)
RBC: 3.67 10*6/uL — AB (ref 4.20–5.82)
RDW: 17.1 % — ABNORMAL HIGH (ref 11.0–14.6)
WBC: 9.3 10*3/uL (ref 4.0–10.3)
lymph#: 1 10*3/uL (ref 0.9–3.3)

## 2016-02-15 MED ORDER — DEXTROSE 5 % IV SOLN
Freq: Once | INTRAVENOUS | Status: AC
  Administered 2016-02-15: 10:00:00 via INTRAVENOUS

## 2016-02-15 MED ORDER — OXALIPLATIN CHEMO INJECTION 100 MG/20ML
85.0000 mg/m2 | Freq: Once | INTRAVENOUS | Status: AC
  Administered 2016-02-15: 165 mg via INTRAVENOUS
  Filled 2016-02-15: qty 33

## 2016-02-15 MED ORDER — PALONOSETRON HCL INJECTION 0.25 MG/5ML
0.2500 mg | Freq: Once | INTRAVENOUS | Status: AC
  Administered 2016-02-15: 0.25 mg via INTRAVENOUS

## 2016-02-15 MED ORDER — SODIUM CHLORIDE 0.9 % IJ SOLN
10.0000 mL | INTRAMUSCULAR | Status: DC | PRN
  Administered 2016-02-15: 10 mL via INTRAVENOUS
  Filled 2016-02-15: qty 10

## 2016-02-15 MED ORDER — ATROPINE SULFATE 1 MG/ML IJ SOLN
INTRAMUSCULAR | Status: AC
  Filled 2016-02-15: qty 1

## 2016-02-15 MED ORDER — SODIUM CHLORIDE 0.9 % IV SOLN
Freq: Once | INTRAVENOUS | Status: AC
  Administered 2016-02-15: 10:00:00 via INTRAVENOUS
  Filled 2016-02-15: qty 5

## 2016-02-15 MED ORDER — PALONOSETRON HCL INJECTION 0.25 MG/5ML
INTRAVENOUS | Status: AC
  Filled 2016-02-15: qty 5

## 2016-02-15 MED ORDER — LEUCOVORIN CALCIUM INJECTION 350 MG
400.0000 mg/m2 | Freq: Once | INTRAVENOUS | Status: AC
  Administered 2016-02-15: 780 mg via INTRAVENOUS
  Filled 2016-02-15: qty 39

## 2016-02-15 MED ORDER — IRINOTECAN HCL CHEMO INJECTION 100 MG/5ML
180.0000 mg/m2 | Freq: Once | INTRAVENOUS | Status: AC
  Administered 2016-02-15: 340 mg via INTRAVENOUS
  Filled 2016-02-15: qty 5.67

## 2016-02-15 MED ORDER — FLUOROURACIL CHEMO INJECTION 5 GM/100ML
2400.0000 mg/m2 | INTRAVENOUS | Status: DC
  Administered 2016-02-15: 4700 mg via INTRAVENOUS
  Filled 2016-02-15: qty 94

## 2016-02-15 MED ORDER — ATROPINE SULFATE 1 MG/ML IJ SOLN
0.5000 mg | Freq: Once | INTRAMUSCULAR | Status: AC | PRN
  Administered 2016-02-15: 0.5 mg via INTRAVENOUS

## 2016-02-15 NOTE — Patient Instructions (Signed)
[facility] Discharge Instructions for Patients Receiving Chemotherapy  Today you received the following chemotherapy agents : Oxaliplatin, Camptosar, Leucovorin, Fluorouracil.  To help prevent nausea and vomiting after your treatment, we encourage you to take your nausea medication as prescribed.   If you develop nausea and vomiting that is not controlled by your nausea medication, call the clinic.   BELOW ARE SYMPTOMS THAT SHOULD BE REPORTED IMMEDIATELY:  *FEVER GREATER THAN 100.5 F  *CHILLS WITH OR WITHOUT FEVER  NAUSEA AND VOMITING THAT IS NOT CONTROLLED WITH YOUR NAUSEA MEDICATION  *UNUSUAL SHORTNESS OF BREATH  *UNUSUAL BRUISING OR BLEEDING  TENDERNESS IN MOUTH AND THROAT WITH OR WITHOUT PRESENCE OF ULCERS  *URINARY PROBLEMS  *BOWEL PROBLEMS  UNUSUAL RASH Items with * indicate a potential emergency and should be followed up as soon as possible.  Feel free to call the clinic you have any questions or concerns. The clinic phone number is [phone].  Please show the [facility] at check-in to the Emergency Department and triage nurse.

## 2016-02-15 NOTE — Telephone Encounter (Signed)
Gave and printed appt sched and avs for pt for April and May °

## 2016-02-15 NOTE — Patient Instructions (Signed)

## 2016-02-15 NOTE — Progress Notes (Signed)
  [facility] OFFICE PROGRESS NOTE   Diagnosis: Pancreas cancer  INTERVAL HISTORY:   Jacob Hardin completed another cycle FOLFIRINOX 02/01/2016. No nausea/vomiting, mouth sores, or diarrhea following chemotherapy. He reports cold sensitivity lasted 7-8 days. No neuropathy symptoms at present. He has an improved energy level and appetite this week. No fever. He has developed dryness and the rash at the forearms.  Objective:  Vital signs in last 24 hours:  Blood pressure 133/72, pulse 78, temperature 98.5 F (36.9 C), temperature source Oral, resp. rate 18, height 6' (1.829 m), weight 155 lb 11.2 oz (70.625 kg), SpO2 100 %.    HEENT: No thrush or ulcers Resp: Lungs clear bilaterally Cardio: Regular rate and rhythm GI: No hepatomegaly, nontender Vascular: No leg edema Neuro: Mild to moderate decrease in vibratory sense at the fingertips bilaterally  Skin: Mild hyperpigmented rash at the forearm bilaterally   Portacath/PICC-without erythema  Lab Results:  Lab Results  Component Value Date   WBC 9.3 02/15/2016   HGB 10.5* 02/15/2016   HCT 32.3* 02/15/2016   MCV 88.0 02/15/2016   PLT 177 02/15/2016   NEUTROABS 7.2* 02/15/2016    Medications: I have reviewed the patient's current medications.  Assessment/Plan: 1. 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  Cycle 4 FOLFIRINOX 02/01/2016  Cycle 5 FOLFIRINOX 02/15/2016  2. Diabetes  3. History of Anorexia/weight loss  secondary to #1  4. Obstructive jaundice secondary to #1-status post placement of a percutaneous internal/external biliary drain 11/25/2015; biliary drainage catheter capped 12/02/2015; biliary drain internalized 12/13/2015  5. Port-A-Cath placement 11/30/2015 interventional radiology  6. Admission 01/17/2016 with a high fever and rigors-no apparent source for infection upon review of his history and examination, blood cultures positive for gram-positive cocci--viridans streptococcus--completed 2 weeks of IV ceftriaxone; no endocarditis on TTE.  7.   Oxaliplatin neuropathy  8.   Elevated transaminases-likely secondary to chemotherapy, reviewed with [facility]   Disposition:  Jacob Hardin appears well. He has completed 4 cycles of FOLFIRINOX. We will follow-up on the CA 19-9 from today. The plan is to proceed with cycle 5 FOLFIRINOX today. He will return for an office visit and chemotherapy in 2 weeks. He will have repeat blood cultures today.  I discussed the elevated liver enzymes with the [place]. There is no indication to dose reduce or delay chemotherapy based on the review of the literature.    Betsy Coder, MD  02/15/2016  8:55 AM

## 2016-02-16 LAB — CANCER ANTIGEN 19-9: CAN 19-9: 245 U/mL — AB (ref 0–35)

## 2016-02-17 ENCOUNTER — Ambulatory Visit: Payer: Medicare Other

## 2016-02-17 ENCOUNTER — Ambulatory Visit (HOSPITAL_BASED_OUTPATIENT_CLINIC_OR_DEPARTMENT_OTHER): Payer: Medicare Other

## 2016-02-17 ENCOUNTER — Telehealth: Payer: Self-pay | Admitting: *Deleted

## 2016-02-17 VITALS — BP 134/72 | HR 57 | Temp 98.3°F | Resp 17

## 2016-02-17 DIAGNOSIS — C250 Malignant neoplasm of head of pancreas: Secondary | ICD-10-CM

## 2016-02-17 DIAGNOSIS — Z452 Encounter for adjustment and management of vascular access device: Secondary | ICD-10-CM | POA: Diagnosis not present

## 2016-02-17 DIAGNOSIS — Z5189 Encounter for other specified aftercare: Secondary | ICD-10-CM

## 2016-02-17 MED ORDER — SODIUM CHLORIDE 0.9% FLUSH
10.0000 mL | INTRAVENOUS | Status: DC | PRN
  Administered 2016-02-17: 10 mL
  Filled 2016-02-17: qty 10

## 2016-02-17 MED ORDER — HEPARIN SOD (PORK) LOCK FLUSH 100 UNIT/ML IV SOLN
500.0000 [IU] | Freq: Once | INTRAVENOUS | Status: AC | PRN
  Administered 2016-02-17: 500 [IU]
  Filled 2016-02-17: qty 5

## 2016-02-17 MED ORDER — PEGFILGRASTIM INJECTION 6 MG/0.6ML ~~LOC~~
6.0000 mg | PREFILLED_SYRINGE | Freq: Once | SUBCUTANEOUS | Status: AC
  Administered 2016-02-17: 6 mg via SUBCUTANEOUS
  Filled 2016-02-17: qty 0.6

## 2016-02-17 NOTE — Telephone Encounter (Signed)
-----   Message from Ladell Pier, MD sent at 02/17/2016  5:51 PM EDT ----- Please call patient, ca19-9 is better

## 2016-02-17 NOTE — Telephone Encounter (Signed)
Per Dr. [name], pt.'s wife notified that ca19-9 is better.  Pt.'s wife has no questions at this time and is appreciative of call.

## 2016-02-17 NOTE — Progress Notes (Signed)
Neulasta injection given during flush appt.

## 2016-02-21 LAB — CULTURE, BLOOD (SINGLE)

## 2016-02-25 ENCOUNTER — Other Ambulatory Visit: Payer: Self-pay | Admitting: Oncology

## 2016-02-29 ENCOUNTER — Ambulatory Visit (HOSPITAL_BASED_OUTPATIENT_CLINIC_OR_DEPARTMENT_OTHER): Payer: Medicare Other

## 2016-02-29 ENCOUNTER — Other Ambulatory Visit (HOSPITAL_BASED_OUTPATIENT_CLINIC_OR_DEPARTMENT_OTHER): Payer: Medicare Other

## 2016-02-29 ENCOUNTER — Encounter: Payer: Self-pay | Admitting: *Deleted

## 2016-02-29 ENCOUNTER — Ambulatory Visit: Payer: Medicare Other | Admitting: Nutrition

## 2016-02-29 ENCOUNTER — Ambulatory Visit (HOSPITAL_BASED_OUTPATIENT_CLINIC_OR_DEPARTMENT_OTHER): Payer: Medicare Other | Admitting: Oncology

## 2016-02-29 VITALS — BP 124/69 | HR 94 | Temp 98.3°F | Resp 18 | Wt 156.8 lb

## 2016-02-29 DIAGNOSIS — Z452 Encounter for adjustment and management of vascular access device: Secondary | ICD-10-CM

## 2016-02-29 DIAGNOSIS — C250 Malignant neoplasm of head of pancreas: Secondary | ICD-10-CM

## 2016-02-29 DIAGNOSIS — C787 Secondary malignant neoplasm of liver and intrahepatic bile duct: Secondary | ICD-10-CM

## 2016-02-29 DIAGNOSIS — Z95828 Presence of other vascular implants and grafts: Secondary | ICD-10-CM

## 2016-02-29 DIAGNOSIS — G620 Drug-induced polyneuropathy: Secondary | ICD-10-CM

## 2016-02-29 DIAGNOSIS — Z5111 Encounter for antineoplastic chemotherapy: Secondary | ICD-10-CM

## 2016-02-29 DIAGNOSIS — R740 Nonspecific elevation of levels of transaminase and lactic acid dehydrogenase [LDH]: Secondary | ICD-10-CM

## 2016-02-29 DIAGNOSIS — E119 Type 2 diabetes mellitus without complications: Secondary | ICD-10-CM | POA: Diagnosis not present

## 2016-02-29 LAB — CBC WITH DIFFERENTIAL/PLATELET
BASO%: 0.3 % (ref 0.0–2.0)
Basophils Absolute: 0 10*3/uL (ref 0.0–0.1)
EOS%: 1.1 % (ref 0.0–7.0)
Eosinophils Absolute: 0.1 10*3/uL (ref 0.0–0.5)
HEMATOCRIT: 30.7 % — AB (ref 38.4–49.9)
HEMOGLOBIN: 9.9 g/dL — AB (ref 13.0–17.1)
LYMPH#: 1.1 10*3/uL (ref 0.9–3.3)
LYMPH%: 8.8 % — ABNORMAL LOW (ref 14.0–49.0)
MCH: 28.5 pg (ref 27.2–33.4)
MCHC: 32.2 g/dL (ref 32.0–36.0)
MCV: 88.5 fL (ref 79.3–98.0)
MONO#: 1.1 10*3/uL — ABNORMAL HIGH (ref 0.1–0.9)
MONO%: 8.5 % (ref 0.0–14.0)
NEUT#: 10.4 10*3/uL — ABNORMAL HIGH (ref 1.5–6.5)
NEUT%: 81.3 % — ABNORMAL HIGH (ref 39.0–75.0)
Platelets: 201 10*3/uL (ref 140–400)
RBC: 3.47 10*6/uL — ABNORMAL LOW (ref 4.20–5.82)
RDW: 17.4 % — AB (ref 11.0–14.6)
WBC: 12.8 10*3/uL — AB (ref 4.0–10.3)

## 2016-02-29 LAB — COMPREHENSIVE METABOLIC PANEL
ALT: 52 U/L (ref 0–55)
ANION GAP: 8 meq/L (ref 3–11)
AST: 43 U/L — ABNORMAL HIGH (ref 5–34)
Albumin: 3.1 g/dL — ABNORMAL LOW (ref 3.5–5.0)
Alkaline Phosphatase: 419 U/L — ABNORMAL HIGH (ref 40–150)
BILIRUBIN TOTAL: 0.37 mg/dL (ref 0.20–1.20)
BUN: 10.2 mg/dL (ref 7.0–26.0)
CALCIUM: 9 mg/dL (ref 8.4–10.4)
CO2: 27 meq/L (ref 22–29)
CREATININE: 0.8 mg/dL (ref 0.7–1.3)
Chloride: 103 mEq/L (ref 98–109)
EGFR: >90 mL/min/{1.73_m2} (ref >90)
Glucose: 207 mg/dl — ABNORMAL HIGH (ref 70–140)
Potassium: 3.8 mEq/L (ref 3.5–5.1)
Sodium: 138 mEq/L (ref 136–145)
TOTAL PROTEIN: 6.2 g/dL — AB (ref 6.4–8.3)

## 2016-02-29 MED ORDER — ATROPINE SULFATE 1 MG/ML IJ SOLN: 0.5000 mg | Freq: Once | INTRAMUSCULAR | Status: DC | PRN

## 2016-02-29 MED ORDER — LEUCOVORIN CALCIUM INJECTION 350 MG
413.0000 mg/m2 | Freq: Once | INTRAVENOUS | Status: AC
  Administered 2016-02-29: 780 mg via INTRAVENOUS
  Filled 2016-02-29: qty 39

## 2016-02-29 MED ORDER — PALONOSETRON HCL INJECTION 0.25 MG/5ML
0.2500 mg | Freq: Once | INTRAVENOUS | Status: AC
  Administered 2016-02-29: 0.25 mg via INTRAVENOUS

## 2016-02-29 MED ORDER — SODIUM CHLORIDE 0.9 % IV SOLN
Freq: Once | INTRAVENOUS | Status: AC
  Administered 2016-02-29: 09:00:00 via INTRAVENOUS
  Filled 2016-02-29: qty 5

## 2016-02-29 MED ORDER — IRINOTECAN HCL CHEMO INJECTION 100 MG/5ML
180.0000 mg/m2 | Freq: Once | INTRAVENOUS | Status: AC
  Administered 2016-02-29: 340 mg via INTRAVENOUS
  Filled 2016-02-29: qty 5.67

## 2016-02-29 MED ORDER — OXALIPLATIN CHEMO INJECTION 100 MG/20ML
86.0000 mg/m2 | Freq: Once | INTRAVENOUS | Status: AC
  Administered 2016-02-29: 165 mg via INTRAVENOUS
  Filled 2016-02-29: qty 33

## 2016-02-29 MED ORDER — SODIUM CHLORIDE 0.9 % IV SOLN
2490.0000 mg/m2 | INTRAVENOUS | Status: DC
  Administered 2016-02-29: 4700 mg via INTRAVENOUS
  Filled 2016-02-29: qty 94

## 2016-02-29 MED ORDER — SODIUM CHLORIDE 0.9 % IJ SOLN
10.0000 mL | INTRAMUSCULAR | Status: DC | PRN
  Administered 2016-02-29: 10 mL via INTRAVENOUS
  Filled 2016-02-29: qty 10

## 2016-02-29 MED ORDER — PALONOSETRON HCL INJECTION 0.25 MG/5ML
INTRAVENOUS | Status: AC
  Filled 2016-02-29: qty 5

## 2016-02-29 MED ORDER — DEXTROSE 5 % IV SOLN
Freq: Once | INTRAVENOUS | Status: AC
  Administered 2016-02-29: 09:00:00 via INTRAVENOUS

## 2016-02-29 NOTE — Progress Notes (Signed)
Nutrition follow-up completed with patient and wife, during infusion for pancreas cancer. Patient reports he continues to feel well stating he feels almost normal. Patient denies nutrition impact symptoms. Weight improved documented as 156.8 pounds on May 10 improved from 155 pounds April 12. Patient has not needed to drink as many protein shakes because he feels he is eating more food.  Nutrition diagnosis: Unintended weight loss improved.  Intervention:  Educated patient to continue strategies for adequate calories and protein intake to promote weight stability. Recommended patient continue premier protein shakes as needed. Teach back method used.  Monitoring, evaluation, goals: Patient will continue to consume adequate calories and protein to promote weight stabilization.  Next visit: Wednesday, May 24, during infusion.  **Disclaimer: This note was dictated with voice recognition software. Similar sounding words can inadvertently be transcribed and this note may contain transcription errors which may not have been corrected upon publication of note.**

## 2016-02-29 NOTE — Patient Instructions (Signed)

## 2016-02-29 NOTE — Progress Notes (Signed)
Oncology Nurse Navigator Documentation  Oncology Nurse Navigator Flowsheets 02/29/2016  Navigator Location [facility]  Navigator Encounter Type Treatment  Abnormal Finding Date -  Confirmed Diagnosis Date -  Treatment Initiated Date -  Patient Visit Type MedOnc  Treatment Phase Active Tx-FOLFIRINOX   Barriers/Navigation Needs No barriers at this time;No Questions;No Needs  Education -  Interventions -  Education Method -  Support Groups/Services -  Acuity -  Time Spent with Patient 15  Reports feeling better of recent-eating well (weight up),mowed his grass. Informed him he could be feeling better because his cancer is getting better and his liver functions are improved.

## 2016-02-29 NOTE — Patient Instructions (Signed)
[facility] Discharge Instructions for Patients Receiving Chemotherapy  Today you received the following chemotherapy agents:  Oxaliplatin, Irinotecan, Leucovorin, Fluorouracil  To help prevent nausea and vomiting after your treatment, we encourage you to take your nausea medication as prescribed.   If you develop nausea and vomiting that is not controlled by your nausea medication, call the clinic.   BELOW ARE SYMPTOMS THAT SHOULD BE REPORTED IMMEDIATELY:  *FEVER GREATER THAN 100.5 F  *CHILLS WITH OR WITHOUT FEVER  NAUSEA AND VOMITING THAT IS NOT CONTROLLED WITH YOUR NAUSEA MEDICATION  *UNUSUAL SHORTNESS OF BREATH  *UNUSUAL BRUISING OR BLEEDING  TENDERNESS IN MOUTH AND THROAT WITH OR WITHOUT PRESENCE OF ULCERS  *URINARY PROBLEMS  *BOWEL PROBLEMS  UNUSUAL RASH Items with * indicate a potential emergency and should be followed up as soon as possible.  Feel free to call the clinic you have any questions or concerns. The clinic phone number is [phone].  Please show the [facility] at check-in to the Emergency Department and triage nurse.

## 2016-02-29 NOTE — Progress Notes (Signed)
  [facility] OFFICE PROGRESS NOTE   Diagnosis: Pancreas cancer  INTERVAL HISTORY:   Jacob Hardin returns as scheduled. He completed another cycle of FOLFIRINOX on 02/15/2016. No nausea/vomiting or mouth sores following chemotherapy. He had cold sensitivity and tingling at the fingers for proximally 5 days following chemotherapy. No neuropathy symptoms at present. He reports constipation for 2-3 days following chemotherapy. No fever. He feels well.  Objective:  Vital signs in last 24 hours:  Blood pressure 124/69, pulse 94, temperature 98.3 F (36.8 C), temperature source Oral, resp. rate 18, weight 156 lb 12.8 oz (71.124 kg), SpO2 100 %.    HEENT: No thrush or ulcers Resp: Lungs clear bilaterally Cardio: Regular rate and rhythm GI: No hepatomegaly, nontender, no mass Vascular: No leg edema Neuro: Mild loss of vibratory sense at [place] bilaterally  Skin: Palms without erythema   Portacath/PICC-without erythema  Lab Results:  Lab Results  Component Value Date   WBC 12.8* 02/29/2016   HGB 9.9* 02/29/2016   HCT 30.7* 02/29/2016   MCV 88.5 02/29/2016   PLT 201 02/29/2016   NEUTROABS 10.4* 02/29/2016     Medications: I have reviewed the patient's current medications.  Assessment/Plan: 1. 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  Cycle 4 FOLFIRINOX 02/01/2016  Cycle 5 FOLFIRINOX 02/15/2016  Cycle 6 FOLFIRINOX 02/29/2016  2. Diabetes  3. History of  Anorexia/weight loss secondary to #1  4. Obstructive jaundice secondary to #1-status post placement of a percutaneous internal/external biliary drain 11/25/2015; biliary drainage catheter capped 12/02/2015; biliary drain internalized 12/13/2015  5. Port-A-Cath placement 11/30/2015 interventional radiology  6. Admission 01/17/2016 with a high fever and rigors-no apparent source for infection upon review of his history and examination, blood cultures positive for gram-positive cocci--viridans streptococcus--completed 2 weeks of IV ceftriaxone; no endocarditis on TTE.  7. Oxaliplatin neuropathy  8. Elevated transaminases-likely secondary to chemotherapy, reviewed with [facility]    Disposition:  Jacob Hardin has completed 5 cycles of FOLFIRINOX. He is tolerating the chemotherapy well. He has an excellent performance status. The plan is to proceed with cycle 6 FOLFIRINOX today. He will return for an office visit and chemotherapy in 2 weeks.  He has developed mild oxaliplatin neuropathy. We will discontinue oxaliplatin from the chemotherapy regimen if the neuropathy symptoms or physical exam findings progress.  The plan is to schedule a restaging CT evaluation after cycle 7.  Betsy Coder, MD  02/29/2016  8:30 AM

## 2016-03-01 ENCOUNTER — Telehealth: Payer: Self-pay | Admitting: *Deleted

## 2016-03-01 LAB — CANCER ANTIGEN 19-9: CAN 19-9: 174 U/mL — AB (ref 0–35)

## 2016-03-01 NOTE — Telephone Encounter (Signed)
Per Dr. [name], pt notified that CA19-9 is better.  Pt appreciative of call and has no questions at this time.

## 2016-03-01 NOTE — Telephone Encounter (Signed)
-----   Message from Ladell Pier, MD sent at 03/01/2016  1:31 PM EDT ----- Please call patient, ca19-9 is better

## 2016-03-02 ENCOUNTER — Ambulatory Visit: Payer: Medicare Other

## 2016-03-02 ENCOUNTER — Ambulatory Visit (HOSPITAL_BASED_OUTPATIENT_CLINIC_OR_DEPARTMENT_OTHER): Payer: Medicare Other

## 2016-03-02 VITALS — BP 146/78 | HR 62 | Temp 97.9°F | Resp 18

## 2016-03-02 DIAGNOSIS — C250 Malignant neoplasm of head of pancreas: Secondary | ICD-10-CM

## 2016-03-02 DIAGNOSIS — Z452 Encounter for adjustment and management of vascular access device: Secondary | ICD-10-CM | POA: Diagnosis not present

## 2016-03-02 DIAGNOSIS — Z5189 Encounter for other specified aftercare: Secondary | ICD-10-CM | POA: Diagnosis not present

## 2016-03-02 MED ORDER — SODIUM CHLORIDE 0.9% FLUSH
10.0000 mL | INTRAVENOUS | Status: DC | PRN
  Administered 2016-03-02: 10 mL
  Filled 2016-03-02: qty 10

## 2016-03-02 MED ORDER — HEPARIN SOD (PORK) LOCK FLUSH 100 UNIT/ML IV SOLN
500.0000 [IU] | Freq: Once | INTRAVENOUS | Status: AC | PRN
  Administered 2016-03-02: 500 [IU]
  Filled 2016-03-02: qty 5

## 2016-03-02 MED ORDER — PEGFILGRASTIM INJECTION 6 MG/0.6ML ~~LOC~~
6.0000 mg | PREFILLED_SYRINGE | Freq: Once | SUBCUTANEOUS | Status: AC
  Administered 2016-03-02: 6 mg via SUBCUTANEOUS
  Filled 2016-03-02: qty 0.6

## 2016-03-11 ENCOUNTER — Other Ambulatory Visit: Payer: Self-pay | Admitting: Oncology

## 2016-03-14 ENCOUNTER — Ambulatory Visit (HOSPITAL_BASED_OUTPATIENT_CLINIC_OR_DEPARTMENT_OTHER): Payer: Medicare Other | Admitting: Oncology

## 2016-03-14 ENCOUNTER — Ambulatory Visit: Payer: Medicare Other | Admitting: Nutrition

## 2016-03-14 ENCOUNTER — Ambulatory Visit (HOSPITAL_BASED_OUTPATIENT_CLINIC_OR_DEPARTMENT_OTHER): Payer: Medicare Other

## 2016-03-14 ENCOUNTER — Telehealth: Payer: Self-pay | Admitting: Oncology

## 2016-03-14 ENCOUNTER — Other Ambulatory Visit: Payer: Self-pay | Admitting: *Deleted

## 2016-03-14 ENCOUNTER — Ambulatory Visit: Payer: Medicare Other

## 2016-03-14 ENCOUNTER — Other Ambulatory Visit (HOSPITAL_BASED_OUTPATIENT_CLINIC_OR_DEPARTMENT_OTHER): Payer: Medicare Other

## 2016-03-14 VITALS — BP 143/51 | HR 71 | Temp 98.5°F | Resp 18 | Ht 72.0 in | Wt 155.6 lb

## 2016-03-14 DIAGNOSIS — Z95828 Presence of other vascular implants and grafts: Secondary | ICD-10-CM

## 2016-03-14 DIAGNOSIS — E119 Type 2 diabetes mellitus without complications: Secondary | ICD-10-CM

## 2016-03-14 DIAGNOSIS — C787 Secondary malignant neoplasm of liver and intrahepatic bile duct: Secondary | ICD-10-CM

## 2016-03-14 DIAGNOSIS — C250 Malignant neoplasm of head of pancreas: Secondary | ICD-10-CM

## 2016-03-14 DIAGNOSIS — Z5111 Encounter for antineoplastic chemotherapy: Secondary | ICD-10-CM | POA: Diagnosis not present

## 2016-03-14 LAB — CBC WITH DIFFERENTIAL/PLATELET
BASO%: 0.3 % (ref 0.0–2.0)
Basophils Absolute: 0 10*3/uL (ref 0.0–0.1)
EOS ABS: 0.1 10*3/uL (ref 0.0–0.5)
EOS%: 1 % (ref 0.0–7.0)
HCT: 30.8 % — ABNORMAL LOW (ref 38.4–49.9)
HGB: 10.1 g/dL — ABNORMAL LOW (ref 13.0–17.1)
LYMPH%: 7.7 % — AB (ref 14.0–49.0)
MCH: 28.9 pg (ref 27.2–33.4)
MCHC: 32.8 g/dL (ref 32.0–36.0)
MCV: 88.3 fL (ref 79.3–98.0)
MONO#: 1.2 10*3/uL — ABNORMAL HIGH (ref 0.1–0.9)
MONO%: 8.9 % (ref 0.0–14.0)
NEUT%: 82.1 % — ABNORMAL HIGH (ref 39.0–75.0)
NEUTROS ABS: 11 10*3/uL — AB (ref 1.5–6.5)
Platelets: 199 10*3/uL (ref 140–400)
RBC: 3.49 10*6/uL — AB (ref 4.20–5.82)
RDW: 17.3 % — ABNORMAL HIGH (ref 11.0–14.6)
WBC: 13.4 10*3/uL — ABNORMAL HIGH (ref 4.0–10.3)
lymph#: 1 10*3/uL (ref 0.9–3.3)

## 2016-03-14 LAB — COMPREHENSIVE METABOLIC PANEL
ALT: 59 U/L — ABNORMAL HIGH (ref 0–55)
AST: 57 U/L — ABNORMAL HIGH (ref 5–34)
Albumin: 3.1 g/dL — ABNORMAL LOW (ref 3.5–5.0)
Alkaline Phosphatase: 526 U/L — ABNORMAL HIGH (ref 40–150)
Anion Gap: 9 mEq/L (ref 3–11)
BUN: 6.6 mg/dL — ABNORMAL LOW (ref 7.0–26.0)
CO2: 25 meq/L (ref 22–29)
Calcium: 8.8 mg/dL (ref 8.4–10.4)
Chloride: 104 mEq/L (ref 98–109)
Creatinine: 0.9 mg/dL (ref 0.7–1.3)
EGFR: 89 mL/min/{1.73_m2} — AB (ref >90)
GLUCOSE: 162 mg/dL — AB (ref 70–140)
POTASSIUM: 3.1 meq/L — AB (ref 3.5–5.1)
SODIUM: 138 meq/L (ref 136–145)
TOTAL PROTEIN: 6.1 g/dL — AB (ref 6.4–8.3)
Total Bilirubin: 0.47 mg/dL (ref 0.20–1.20)

## 2016-03-14 MED ORDER — IRINOTECAN HCL CHEMO INJECTION 100 MG/5ML
180.0000 mg/m2 | Freq: Once | INTRAVENOUS | Status: AC
  Administered 2016-03-14: 340 mg via INTRAVENOUS
  Filled 2016-03-14: qty 5.67

## 2016-03-14 MED ORDER — PALONOSETRON HCL INJECTION 0.25 MG/5ML
INTRAVENOUS | Status: AC
  Filled 2016-03-14: qty 5

## 2016-03-14 MED ORDER — PALONOSETRON HCL INJECTION 0.25 MG/5ML
0.2500 mg | Freq: Once | INTRAVENOUS | Status: AC
  Administered 2016-03-14: 0.25 mg via INTRAVENOUS

## 2016-03-14 MED ORDER — POTASSIUM CHLORIDE CRYS ER 20 MEQ PO TBCR: 20.0000 meq | EXTENDED_RELEASE_TABLET | Freq: Every day | ORAL | Status: DC

## 2016-03-14 MED ORDER — SODIUM CHLORIDE 0.9 % IV SOLN
Freq: Once | INTRAVENOUS | Status: AC
  Administered 2016-03-14: 11:00:00 via INTRAVENOUS
  Filled 2016-03-14: qty 5

## 2016-03-14 MED ORDER — LEUCOVORIN CALCIUM INJECTION 350 MG
400.0000 mg/m2 | Freq: Once | INTRAMUSCULAR | Status: AC
  Administered 2016-03-14: 756 mg via INTRAVENOUS
  Filled 2016-03-14: qty 37.8

## 2016-03-14 MED ORDER — DEXTROSE 5 % IV SOLN
Freq: Once | INTRAVENOUS | Status: AC
  Administered 2016-03-14: 10:00:00 via INTRAVENOUS

## 2016-03-14 MED ORDER — LORAZEPAM 2 MG/ML IJ SOLN
1.0000 mg | Freq: Once | INTRAMUSCULAR | Status: AC
  Administered 2016-03-14: 1 mg via INTRAVENOUS

## 2016-03-14 MED ORDER — OXALIPLATIN CHEMO INJECTION 100 MG/20ML
85.0000 mg/m2 | Freq: Once | INTRAVENOUS | Status: AC
  Administered 2016-03-14: 160 mg via INTRAVENOUS
  Filled 2016-03-14: qty 32

## 2016-03-14 MED ORDER — SODIUM CHLORIDE 0.9 % IV SOLN
2400.0000 mg/m2 | INTRAVENOUS | Status: DC
  Administered 2016-03-14: 4550 mg via INTRAVENOUS
  Filled 2016-03-14: qty 91

## 2016-03-14 MED ORDER — SODIUM CHLORIDE 0.9% FLUSH
10.0000 mL | INTRAVENOUS | Status: AC | PRN
  Administered 2016-03-14: 10 mL via INTRAVENOUS
  Filled 2016-03-14: qty 10

## 2016-03-14 NOTE — Patient Instructions (Addendum)
Irinotecan injection  What is this medicine?  IRINOTECAN (ir in oh TEE kan ) is a chemotherapy drug. It is used to treat colon and rectal cancer.  This medicine may be used for other purposes; ask your health care provider or pharmacist if you have questions.  What should I tell my health care provider before I take this medicine?  They need to know if you have any of these conditions:  -blood disorders  -dehydration  -diarrhea  -infection (especially a virus infection such as chickenpox, cold sores, or herpes)  -liver disease  -low blood counts, like low white cell, platelet, or red cell counts  -recent or ongoing radiation therapy  -an unusual or allergic reaction to irinotecan, sorbitol, other chemotherapy, other medicines, foods, dyes, or preservatives  -pregnant or trying to get pregnant  -breast-feeding  How should I use this medicine?  This drug is given as an infusion into a vein. It is administered in a hospital or clinic by a specially trained health care professional.  Talk to your pediatrician regarding the use of this medicine in children. Special care may be needed.  Overdosage: If you think you have taken too much of this medicine contact a poison control center or emergency room at once.  NOTE: This medicine is only for you. Do not share this medicine with others.  What if I miss a dose?  It is important not to miss your dose. Call your doctor or health care professional if you are unable to keep an appointment.  What may interact with this medicine?  Do not take this medicine with any of the following medications:  -atazanavir  -certain medicines for fungal infections like itraconazole and ketoconazole  -St. John's Wort  This medicine may also interact with the following medications:  -dexamethasone  -diuretics  -laxatives  -medicines for seizures like carbamazepine, mephobarbital, phenobarbital, phenytoin, primidone  -medicines to increase blood counts like filgrastim, pegfilgrastim,  sargramostim  -prochlorperazine  -vaccines  This list may not describe all possible interactions. Give your health care provider a list of all the medicines, herbs, non-prescription drugs, or dietary supplements you use. Also tell them if you smoke, drink alcohol, or use illegal drugs. Some items may interact with your medicine.  What should I watch for while using this medicine?  Your condition will be monitored carefully while you are receiving this medicine. You will need important blood work done while you are taking this medicine.  This drug may make you feel generally unwell. This is not uncommon, as chemotherapy can affect healthy cells as well as cancer cells. Report any side effects. Continue your course of treatment even though you feel ill unless your doctor tells you to stop.  In some cases, you may be given additional medicines to help with side effects. Follow all directions for their use.  You may get drowsy or dizzy. Do not drive, use machinery, or do anything that needs mental alertness until you know how this medicine affects you. Do not stand or sit up quickly, especially if you are an older patient. This reduces the risk of dizzy or fainting spells.  Call your doctor or health care professional for advice if you get a fever, chills or sore throat, or other symptoms of a cold or flu. Do not treat yourself. This drug decreases your body's ability to fight infections. Try to avoid being around people who are sick.  This medicine may increase your risk to bruise or bleed. Call your doctor   receiving this medicine. Avoid taking products that contain aspirin, acetaminophen, ibuprofen, naproxen, or ketoprofen unless instructed by your doctor. These medicines may  hide a fever. Do not become pregnant while taking this medicine. Women should inform their doctor if they wish to become pregnant or think they might be pregnant. There is a potential for serious side effects to an unborn child. Talk to your health care professional or pharmacist for more information. Do not breast-feed an infant while taking this medicine. What side effects may I notice from receiving this medicine? Side effects that you should report to your doctor or health care professional as soon as possible: -allergic reactions like skin rash, itching or hives, swelling of the face, lips, or tongue -low blood counts - this medicine may decrease the number of white blood cells, red blood cells and platelets. You may be at increased risk for infections and bleeding. -signs of infection - fever or chills, cough, sore throat, pain or difficulty passing urine -signs of decreased platelets or bleeding - bruising, pinpoint red spots on the skin, black, tarry stools, blood in the urine -signs of decreased red blood cells - unusually weak or tired, fainting spells, lightheadedness -breathing problems -chest pain -diarrhea -feeling faint or lightheaded, falls -flushing, runny nose, sweating during infusion -mouth sores or pain -pain, swelling, redness or irritation where injected -pain, swelling, warmth in the leg -pain, tingling, numbness in the hands or feet -problems with balance, talking, walking -stomach cramps, pain -trouble passing urine or change in the amount of urine -vomiting as to be unable to hold down drinks or food -yellowing of the eyes or skin Side effects that usually do not require medical attention (report to your doctor or health care professional if they continue or are bothersome): -constipation -hair loss -headache -loss of appetite -nausea, vomiting -stomach upset This list may not describe all possible side effects. Call your doctor for medical advice about side  effects. You may report side effects to FDA at 1-800-FDA-1088. Where should I keep my medicine? This drug is given in a hospital or clinic and will not be stored at home. NOTE: This sheet is a summary. It may not cover all possible information. If you have questions about this medicine, talk to your doctor, pharmacist, or health care provider.    2016, [employer]. (2013-04-06 16:29:32) Oxaliplatin Injection What is this medicine? OXALIPLATIN (ox AL i PLA tin) is a chemotherapy drug. It targets fast dividing cells, like cancer cells, and causes these cells to die. This medicine is used to treat cancers of the colon and rectum, and many other cancers. This medicine may be used for other purposes; ask your health care provider or pharmacist if you have questions. What should I tell my health care provider before I take this medicine? They need to know if you have any of these conditions: -kidney disease -an unusual or allergic reaction to oxaliplatin, other chemotherapy, other medicines, foods, dyes, or preservatives -pregnant or trying to get pregnant -breast-feeding How should I use this medicine? This drug is given as an infusion into a vein. It is administered in a hospital or clinic by a specially trained health care professional. Talk to your pediatrician regarding the use of this medicine in children. Special care may be needed. Overdosage: If you think you have taken too much of this medicine contact a poison control center or emergency room at once. NOTE: This medicine is only for you. Do not share this medicine with others. What if  I miss a dose? It is important not to miss a dose. Call your doctor or health care professional if you are unable to keep an appointment. What may interact with this medicine? -medicines to increase blood counts like filgrastim, pegfilgrastim, sargramostim -probenecid -some antibiotics like amikacin, gentamicin, neomycin, polymyxin B,  streptomycin, tobramycin -zalcitabine Talk to your doctor or health care professional before taking any of these medicines: -acetaminophen -aspirin -ibuprofen -ketoprofen -naproxen This list may not describe all possible interactions. Give your health care provider a list of all the medicines, herbs, non-prescription drugs, or dietary supplements you use. Also tell them if you smoke, drink alcohol, or use illegal drugs. Some items may interact with your medicine. What should I watch for while using this medicine? Your condition will be monitored carefully while you are receiving this medicine. You will need important blood work done while you are taking this medicine. This medicine can make you more sensitive to cold. Do not drink cold drinks or use ice. Cover exposed skin before coming in contact with cold temperatures or cold objects. When out in cold weather wear warm clothing and cover your mouth and nose to warm the air that goes into your lungs. Tell your doctor if you get sensitive to the cold. This drug may make you feel generally unwell. This is not uncommon, as chemotherapy can affect healthy cells as well as cancer cells. Report any side effects. Continue your course of treatment even though you feel ill unless your doctor tells you to stop. In some cases, you may be given additional medicines to help with side effects. Follow all directions for their use. Call your doctor or health care professional for advice if you get a fever, chills or sore throat, or other symptoms of a cold or flu. Do not treat yourself. This drug decreases your body's ability to fight infections. Try to avoid being around people who are sick. This medicine may increase your risk to bruise or bleed. Call your doctor or health care professional if you notice any unusual bleeding. Be careful brushing and flossing your teeth or using a toothpick because you may get an infection or bleed more easily. If you have any  dental work done, tell your dentist you are receiving this medicine. Avoid taking products that contain aspirin, acetaminophen, ibuprofen, naproxen, or ketoprofen unless instructed by your doctor. These medicines may hide a fever. Do not become pregnant while taking this medicine. Women should inform their doctor if they wish to become pregnant or think they might be pregnant. There is a potential for serious side effects to an unborn child. Talk to your health care professional or pharmacist for more information. Do not breast-feed an infant while taking this medicine. Call your doctor or health care professional if you get diarrhea. Do not treat yourself. What side effects may I notice from receiving this medicine? Side effects that you should report to your doctor or health care professional as soon as possible: -allergic reactions like skin rash, itching or hives, swelling of the face, lips, or tongue -low blood counts - This drug may decrease the number of white blood cells, red blood cells and platelets. You may be at increased risk for infections and bleeding. -signs of infection - fever or chills, cough, sore throat, pain or difficulty passing urine -signs of decreased platelets or bleeding - bruising, pinpoint red spots on the skin, black, tarry stools, nosebleeds -signs of decreased red blood cells - unusually weak or tired, fainting spells,  lightheadedness -breathing problems -chest pain, pressure -cough -diarrhea -jaw tightness -mouth sores -nausea and vomiting -pain, swelling, redness or irritation at the injection site -pain, tingling, numbness in the hands or feet -problems with balance, talking, walking -redness, blistering, peeling or loosening of the skin, including inside the mouth -trouble passing urine or change in the amount of urine Side effects that usually do not require medical attention (report to your doctor or health care professional if they continue or are  bothersome): -changes in vision -constipation -hair loss -loss of appetite -metallic taste in the mouth or changes in taste -stomach pain This list may not describe all possible side effects. Call your doctor for medical advice about side effects. You may report side effects to FDA at 1-800-FDA-1088. Where should I keep my medicine? This drug is given in a hospital or clinic and will not be stored at home. NOTE: This sheet is a summary. It may not cover all possible information. If you have questions about this medicine, talk to your doctor, pharmacist, or health care provider.    2016, [employer] Standard. (2008-05-04 17:22:47) Leucovorin injection What is this medicine? LEUCOVORIN (loo koe VOR in) is used to prevent or treat the harmful effects of some medicines. This medicine is used to treat anemia caused by a low amount of folic acid in the body. It is also used with 5-fluorouracil (5-FU) to treat colon cancer. This medicine may be used for other purposes; ask your health care provider or pharmacist if you have questions. What should I tell my health care provider before I take this medicine? They need to know if you have any of these conditions: -anemia from low levels of vitamin B-12 in the blood -an unusual or allergic reaction to leucovorin, folic acid, other medicines, foods, dyes, or preservatives -pregnant or trying to get pregnant -breast-feeding How should I use this medicine? This medicine is for injection into a muscle or into a vein. It is given by a health care professional in a hospital or clinic setting. Talk to your pediatrician regarding the use of this medicine in children. Special care may be needed. Overdosage: If you think you have taken too much of this medicine contact a poison control center or emergency room at once. NOTE: This medicine is only for you. Do not share this medicine with others. What if I miss a dose? This does not apply. What may interact  with this medicine? -capecitabine -fluorouracil -phenobarbital -phenytoin -primidone -trimethoprim-sulfamethoxazole This list may not describe all possible interactions. Give your health care provider a list of all the medicines, herbs, non-prescription drugs, or dietary supplements you use. Also tell them if you smoke, drink alcohol, or use illegal drugs. Some items may interact with your medicine. What should I watch for while using this medicine? Your condition will be monitored carefully while you are receiving this medicine. This medicine may increase the side effects of 5-fluorouracil, 5-FU. Tell your doctor or health care professional if you have diarrhea or mouth sores that do not get better or that get worse. What side effects may I notice from receiving this medicine? Side effects that you should report to your doctor or health care professional as soon as possible: -allergic reactions like skin rash, itching or hives, swelling of the face, lips, or tongue -breathing problems -fever, infection -mouth sores -unusual bleeding or bruising -unusually weak or tired Side effects that usually do not require medical attention (report to your doctor or health care professional if they continue  or are bothersome): -constipation or diarrhea -loss of appetite -nausea, vomiting This list may not describe all possible side effects. Call your doctor for medical advice about side effects. You may report side effects to FDA at 1-800-FDA-1088. Where should I keep my medicine? This drug is given in a hospital or clinic and will not be stored at home. NOTE: This sheet is a summary. It may not cover all possible information. If you have questions about this medicine, talk to your doctor, pharmacist, or health care provider.    2016, [employer] Standard. (2008-04-13 16:50:29) Fluorouracil, 5-FU injection What is this medicine? FLUOROURACIL, 5-FU (flure oh YOOR a sil) is a chemotherapy drug. It  slows the growth of cancer cells. This medicine is used to treat many types of cancer like breast cancer, colon or rectal cancer, pancreatic cancer, and stomach cancer. This medicine may be used for other purposes; ask your health care provider or pharmacist if you have questions. What should I tell my health care provider before I take this medicine? They need to know if you have any of these conditions: -blood disorders -dihydropyrimidine dehydrogenase (DPD) deficiency -infection (especially a virus infection such as chickenpox, cold sores, or herpes) -kidney disease -liver disease -malnourished, poor nutrition -recent or ongoing radiation therapy -an unusual or allergic reaction to fluorouracil, other chemotherapy, other medicines, foods, dyes, or preservatives -pregnant or trying to get pregnant -breast-feeding How should I use this medicine? This drug is given as an infusion or injection into a vein. It is administered in a hospital or clinic by a specially trained health care professional. Talk to your pediatrician regarding the use of this medicine in children. Special care may be needed. Overdosage: If you think you have taken too much of this medicine contact a poison control center or emergency room at once. NOTE: This medicine is only for you. Do not share this medicine with others. What if I miss a dose? It is important not to miss your dose. Call your doctor or health care professional if you are unable to keep an appointment. What may interact with this medicine? -allopurinol -cimetidine -dapsone -digoxin -hydroxyurea -leucovorin -levamisole -medicines for seizures like ethotoin, fosphenytoin, phenytoin -medicines to increase blood counts like filgrastim, pegfilgrastim, sargramostim -medicines that treat or prevent blood clots like warfarin, enoxaparin, and dalteparin -methotrexate -metronidazole -pyrimethamine -some other chemotherapy drugs like busulfan, cisplatin,  estramustine, vinblastine -trimethoprim -trimetrexate -vaccines Talk to your doctor or health care professional before taking any of these medicines: -acetaminophen -aspirin -ibuprofen -ketoprofen -naproxen This list may not describe all possible interactions. Give your health care provider a list of all the medicines, herbs, non-prescription drugs, or dietary supplements you use. Also tell them if you smoke, drink alcohol, or use illegal drugs. Some items may interact with your medicine. What should I watch for while using this medicine? Visit your doctor for checks on your progress. This drug may make you feel generally unwell. This is not uncommon, as chemotherapy can affect healthy cells as well as cancer cells. Report any side effects. Continue your course of treatment even though you feel ill unless your doctor tells you to stop. In some cases, you may be given additional medicines to help with side effects. Follow all directions for their use. Call your doctor or health care professional for advice if you get a fever, chills or sore throat, or other symptoms of a cold or flu. Do not treat yourself. This drug decreases your body's ability to fight infections. Try to avoid  being around people who are sick. This medicine may increase your risk to bruise or bleed. Call your doctor or health care professional if you notice any unusual bleeding. Be careful brushing and flossing your teeth or using a toothpick because you may get an infection or bleed more easily. If you have any dental work done, tell your dentist you are receiving this medicine. Avoid taking products that contain aspirin, acetaminophen, ibuprofen, naproxen, or ketoprofen unless instructed by your doctor. These medicines may hide a fever. Do not become pregnant while taking this medicine. Women should inform their doctor if they wish to become pregnant or think they might be pregnant. There is a potential for serious side effects  to an unborn child. Talk to your health care professional or pharmacist for more information. Do not breast-feed an infant while taking this medicine. Men should inform their doctor if they wish to father a child. This medicine may lower sperm counts. Do not treat diarrhea with over the counter products. Contact your doctor if you have diarrhea that lasts more than 2 days or if it is severe and watery. This medicine can make you more sensitive to the sun. Keep out of the sun. If you cannot avoid being in the sun, wear protective clothing and use sunscreen. Do not use sun lamps or tanning beds/booths. What side effects may I notice from receiving this medicine? Side effects that you should report to your doctor or health care professional as soon as possible: -allergic reactions like skin rash, itching or hives, swelling of the face, lips, or tongue -low blood counts - this medicine may decrease the number of white blood cells, red blood cells and platelets. You may be at increased risk for infections and bleeding. -signs of infection - fever or chills, cough, sore throat, pain or difficulty passing urine -signs of decreased platelets or bleeding - bruising, pinpoint red spots on the skin, black, tarry stools, blood in the urine -signs of decreased red blood cells - unusually weak or tired, fainting spells, lightheadedness -breathing problems -changes in vision -chest pain -mouth sores -nausea and vomiting -pain, swelling, redness at site where injected -pain, tingling, numbness in the hands or feet -redness, swelling, or sores on hands or feet -stomach pain -unusual bleeding Side effects that usually do not require medical attention (report to your doctor or health care professional if they continue or are bothersome): -changes in finger or toe nails -diarrhea -dry or itchy skin -hair loss -headache -loss of appetite -sensitivity of eyes to the light -stomach upset -unusually teary  eyes This list may not describe all possible side effects. Call your doctor for medical advice about side effects. You may report side effects to FDA at 1-800-FDA-1088. Where should I keep my medicine? This drug is given in a hospital or clinic and will not be stored at home. NOTE: This sheet is a summary. It may not cover all possible information. If you have questions about this medicine, talk to your doctor, pharmacist, or health care provider.    2016, [employer] Standard. (2008-02-11 13:53:16)   PRESCRIPTION FOR POTASSIUM 20 MEQ DAILY HAS BEEN CALLED IN TO [facility] ON [address]

## 2016-03-14 NOTE — Progress Notes (Signed)
1543 patient feeling less nausea after getting ativan iv push. Left via w/c. Wife donna to drive.

## 2016-03-14 NOTE — Progress Notes (Signed)
Per Dr. [name], pt is to start KCL 20 meq PO daily.  Pt in infusion room and notified of new prescription.

## 2016-03-14 NOTE — Telephone Encounter (Signed)
per pof to sch pt appt-sent MW email to sch trmt-pt to get updated copy on 5/26 appt

## 2016-03-14 NOTE — Progress Notes (Unsigned)
Patient c/o nausea, per lisa thomas give 1 mg of iv ativan.

## 2016-03-14 NOTE — Progress Notes (Signed)
  [place] OFFICE PROGRESS NOTE   Diagnosis: Pancreas cancer  INTERVAL HISTORY:   [name] returns as scheduled. He completed another cycle of FOLFIRINOX on 02/29/2016. He reports increased malaise following this cycle of chemotherapy. No nausea/vomiting, mouth sores, diarrhea, or fever. He has persistent cold sensitivity. No numbness. Good appetite. No pain. He has been active working at home.  Objective:  Vital signs in last 24 hours:  Blood pressure 143/51, pulse 71, temperature 98.5 F (36.9 C), temperature source Oral, resp. rate 18, height 6' (1.829 m), weight 155 lb 9.6 oz (70.58 kg), SpO2 98 %.    HEENT: No thrush or ulcers Resp: Lungs clear bilaterally Cardio: Regular rate and rhythm GI: No hepatomegaly, nontender, no mass Vascular: No leg edema Neuro: Mild to moderate decrease in vibratory sense at the fingertips bilaterally  Skin: Healing erythematous lesion was superficial skin breakdown at the left upper back   Portacath/PICC-without erythema  Lab Results:  Lab Results  Component Value Date   WBC 13.4* 03/14/2016   HGB 10.1* 03/14/2016   HCT 30.8* 03/14/2016   MCV 88.3 03/14/2016   PLT 199 03/14/2016   NEUTROABS 11.0* 03/14/2016  Alkaline phosphatase 526, AST 57, ALT 59 CA 19-9 on 02/29/2016-174  Medications: I have reviewed the patient's current medications.  Assessment/Plan: 1. 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  Cycle 4 FOLFIRINOX  02/01/2016  Cycle 5 FOLFIRINOX 02/15/2016  Cycle 6 FOLFIRINOX 02/29/2016  Cycle 7 FOLFIRINOX 03/14/2016  2. Diabetes  3. History of Anorexia/weight loss secondary to #1  4. Obstructive jaundice secondary to #1-status post placement of a percutaneous internal/external biliary drain 11/25/2015; biliary drainage catheter capped 12/02/2015; biliary drain internalized 12/13/2015  5. Port-A-Cath placement 11/30/2015 interventional radiology  6. Admission 01/17/2016 with a high fever and rigors-no apparent source for infection upon review of his history and examination, blood cultures positive for gram-positive cocci--viridans streptococcus--completed 2 weeks of IV ceftriaxone; no endocarditis on TTE.  7. Oxaliplatin neuropathy  8. Elevated transaminases-likely secondary to chemotherapy-improved    Disposition:  Jacob Hardin continues to tolerate chemotherapy well. He has developed mild neuropathy symptoms, not interfering with activity at present. The plan is to proceed with another cycle of FOLFIRINOX today. He will return for an office visit and cycle 8 FOLFIRINOX in 2 weeks. He will be scheduled for a restaging CT after cycle 8.    Betsy Coder, MD  03/14/2016  10:21 AM

## 2016-03-14 NOTE — Progress Notes (Signed)
Nutrition follow-up completed with patient and wife, during infusion for pancreas cancer. Patient is asleep so I did not awaken. Weight is stable overall and documented as 155 pounds. Per wife.  Patient feels good and has a good appetite.   Wife reports patient's potassium was low. Expressed no nutrition needs.  Nutrition diagnosis: Unintended weight loss improved.  Intervention:  Patient was educated to continue strategies for adequate calories and protein to promote weight maintenance. Provided diet education on foods high in potassium.  I provided fact sheet. Questions were answered.  Teach back was used.  Monitoring, evaluation, goals: Patient will tolerate adequate calories and protein to promote maintenance of lean body mass.  Next visit: Wednesday, June 7, during infusion.  **Disclaimer: This note was dictated with voice recognition software. Similar sounding words can inadvertently be transcribed and this note may contain transcription errors which may not have been corrected upon publication of note.**

## 2016-03-14 NOTE — Progress Notes (Signed)
Pt here in infusion room, will notify patient of new prescription for KCL.

## 2016-03-15 LAB — CANCER ANTIGEN 19-9: CA 19-9: 171 U/mL — ABNORMAL HIGH (ref 0–35)

## 2016-03-16 ENCOUNTER — Ambulatory Visit (HOSPITAL_BASED_OUTPATIENT_CLINIC_OR_DEPARTMENT_OTHER): Payer: Medicare Other

## 2016-03-16 ENCOUNTER — Ambulatory Visit: Payer: Medicare Other

## 2016-03-16 VITALS — BP 144/76 | HR 68 | Temp 98.0°F | Resp 16

## 2016-03-16 DIAGNOSIS — Z5189 Encounter for other specified aftercare: Secondary | ICD-10-CM | POA: Diagnosis not present

## 2016-03-16 DIAGNOSIS — C250 Malignant neoplasm of head of pancreas: Secondary | ICD-10-CM

## 2016-03-16 MED ORDER — HEPARIN SOD (PORK) LOCK FLUSH 100 UNIT/ML IV SOLN
500.0000 [IU] | Freq: Once | INTRAVENOUS | Status: AC | PRN
  Administered 2016-03-16: 500 [IU]
  Filled 2016-03-16: qty 5

## 2016-03-16 MED ORDER — SODIUM CHLORIDE 0.9% FLUSH
10.0000 mL | INTRAVENOUS | Status: DC | PRN
  Administered 2016-03-16: 10 mL
  Filled 2016-03-16: qty 10

## 2016-03-16 MED ORDER — PEGFILGRASTIM INJECTION 6 MG/0.6ML ~~LOC~~
6.0000 mg | PREFILLED_SYRINGE | Freq: Once | SUBCUTANEOUS | Status: AC
  Administered 2016-03-16: 6 mg via SUBCUTANEOUS
  Filled 2016-03-16: qty 0.6

## 2016-03-16 NOTE — Patient Instructions (Signed)

## 2016-03-25 ENCOUNTER — Other Ambulatory Visit: Payer: Self-pay | Admitting: Oncology

## 2016-03-26 LAB — HM DIABETES EYE EXAM

## 2016-03-27 ENCOUNTER — Encounter: Payer: Self-pay | Admitting: *Deleted

## 2016-03-28 ENCOUNTER — Ambulatory Visit (HOSPITAL_BASED_OUTPATIENT_CLINIC_OR_DEPARTMENT_OTHER): Payer: Medicare Other

## 2016-03-28 ENCOUNTER — Ambulatory Visit: Payer: Medicare Other | Admitting: Nutrition

## 2016-03-28 ENCOUNTER — Other Ambulatory Visit (HOSPITAL_BASED_OUTPATIENT_CLINIC_OR_DEPARTMENT_OTHER): Payer: Medicare Other

## 2016-03-28 ENCOUNTER — Ambulatory Visit (HOSPITAL_BASED_OUTPATIENT_CLINIC_OR_DEPARTMENT_OTHER): Payer: Medicare Other | Admitting: Nurse Practitioner

## 2016-03-28 VITALS — BP 127/67 | HR 59 | Temp 97.7°F | Resp 18 | Wt 150.8 lb

## 2016-03-28 DIAGNOSIS — C250 Malignant neoplasm of head of pancreas: Secondary | ICD-10-CM

## 2016-03-28 DIAGNOSIS — G620 Drug-induced polyneuropathy: Secondary | ICD-10-CM | POA: Diagnosis not present

## 2016-03-28 DIAGNOSIS — E119 Type 2 diabetes mellitus without complications: Secondary | ICD-10-CM

## 2016-03-28 DIAGNOSIS — Z5111 Encounter for antineoplastic chemotherapy: Secondary | ICD-10-CM

## 2016-03-28 DIAGNOSIS — C787 Secondary malignant neoplasm of liver and intrahepatic bile duct: Secondary | ICD-10-CM | POA: Diagnosis not present

## 2016-03-28 DIAGNOSIS — R740 Nonspecific elevation of levels of transaminase and lactic acid dehydrogenase [LDH]: Secondary | ICD-10-CM

## 2016-03-28 LAB — CBC WITH DIFFERENTIAL/PLATELET
BASO%: 0.5 % (ref 0.0–2.0)
Basophils Absolute: 0 10*3/uL (ref 0.0–0.1)
EOS%: 2.1 % (ref 0.0–7.0)
Eosinophils Absolute: 0.2 10*3/uL (ref 0.0–0.5)
HEMATOCRIT: 32.4 % — AB (ref 38.4–49.9)
HEMOGLOBIN: 10.7 g/dL — AB (ref 13.0–17.1)
LYMPH#: 1.2 10*3/uL (ref 0.9–3.3)
LYMPH%: 12.9 % — ABNORMAL LOW (ref 14.0–49.0)
MCH: 29.5 pg (ref 27.2–33.4)
MCHC: 32.9 g/dL (ref 32.0–36.0)
MCV: 89.5 fL (ref 79.3–98.0)
MONO#: 1 10*3/uL — ABNORMAL HIGH (ref 0.1–0.9)
MONO%: 10.9 % (ref 0.0–14.0)
NEUT%: 73.6 % (ref 39.0–75.0)
NEUTROS ABS: 6.7 10*3/uL — AB (ref 1.5–6.5)
Platelets: 179 10*3/uL (ref 140–400)
RBC: 3.62 10*6/uL — ABNORMAL LOW (ref 4.20–5.82)
RDW: 19.1 % — AB (ref 11.0–14.6)
WBC: 9.1 10*3/uL (ref 4.0–10.3)

## 2016-03-28 LAB — COMPREHENSIVE METABOLIC PANEL
ALBUMIN: 3.2 g/dL — AB (ref 3.5–5.0)
ALK PHOS: 471 U/L — AB (ref 40–150)
ALT: 59 U/L — AB (ref 0–55)
AST: 50 U/L — AB (ref 5–34)
Anion Gap: 8 mEq/L (ref 3–11)
BUN: 10.8 mg/dL (ref 7.0–26.0)
CO2: 26 mEq/L (ref 22–29)
CREATININE: 0.9 mg/dL (ref 0.7–1.3)
Calcium: 9 mg/dL (ref 8.4–10.4)
Chloride: 105 mEq/L (ref 98–109)
EGFR: 87 mL/min/{1.73_m2} — ABNORMAL LOW (ref >90)
GLUCOSE: 214 mg/dL — AB (ref 70–140)
POTASSIUM: 4.1 meq/L (ref 3.5–5.1)
SODIUM: 140 meq/L (ref 136–145)
TOTAL PROTEIN: 6.2 g/dL — AB (ref 6.4–8.3)
Total Bilirubin: 0.34 mg/dL (ref 0.20–1.20)

## 2016-03-28 MED ORDER — ATROPINE SULFATE 1 MG/ML IJ SOLN: 0.5000 mg | Freq: Once | INTRAMUSCULAR | Status: DC | PRN

## 2016-03-28 MED ORDER — PALONOSETRON HCL INJECTION 0.25 MG/5ML
INTRAVENOUS | Status: AC
  Filled 2016-03-28: qty 5

## 2016-03-28 MED ORDER — SODIUM CHLORIDE 0.9 % IV SOLN
2400.0000 mg/m2 | INTRAVENOUS | Status: DC
  Administered 2016-03-28: 4550 mg via INTRAVENOUS
  Filled 2016-03-28: qty 91

## 2016-03-28 MED ORDER — IRINOTECAN HCL CHEMO INJECTION 100 MG/5ML
180.0000 mg/m2 | Freq: Once | INTRAVENOUS | Status: AC
  Administered 2016-03-28: 340 mg via INTRAVENOUS
  Filled 2016-03-28: qty 5.67

## 2016-03-28 MED ORDER — DEXTROSE 5 % IV SOLN
Freq: Once | INTRAVENOUS | Status: AC
  Administered 2016-03-28: 11:00:00 via INTRAVENOUS

## 2016-03-28 MED ORDER — FOSAPREPITANT DIMEGLUMINE INJECTION 150 MG
Freq: Once | INTRAVENOUS | Status: AC
  Administered 2016-03-28: 11:00:00 via INTRAVENOUS
  Filled 2016-03-28: qty 5

## 2016-03-28 MED ORDER — OXALIPLATIN CHEMO INJECTION 100 MG/20ML
85.0000 mg/m2 | Freq: Once | INTRAVENOUS | Status: AC
  Administered 2016-03-28: 160 mg via INTRAVENOUS
  Filled 2016-03-28: qty 32

## 2016-03-28 MED ORDER — DEXTROSE 5 % IV SOLN
400.0000 mg/m2 | Freq: Once | INTRAVENOUS | Status: AC
  Administered 2016-03-28: 756 mg via INTRAVENOUS
  Filled 2016-03-28: qty 37.8

## 2016-03-28 MED ORDER — PALONOSETRON HCL INJECTION 0.25 MG/5ML
0.2500 mg | Freq: Once | INTRAVENOUS | Status: AC
  Administered 2016-03-28: 0.25 mg via INTRAVENOUS

## 2016-03-28 NOTE — Progress Notes (Signed)
  [place] OFFICE PROGRESS NOTE   Diagnosis:  Pancreas cancer  INTERVAL HISTORY:   Jacob Hardin returns as scheduled. He completed cycle 7 FOLFIRINOX 03/14/2016. He denies nausea/vomiting. No mouth sores. No diarrhea. He has mild persistent cold sensitivity. No numbness or tingling unrelated to cold exposure. He denies pain. Main complaints are decreased appetite and poor energy.  Objective:  Vital signs in last 24 hours:  Blood pressure 127/67, pulse 59, temperature 97.7 F (36.5 C), temperature source Oral, resp. rate 18, weight 150 lb 12.8 oz (68.402 kg), SpO2 100 %.    HEENT: No thrush or ulcers. Resp: Lungs clear bilaterally. Cardio: Regular rate and rhythm. GI: Abdomen soft and nontender. No hepatomegaly. No mass. Vascular: No leg edema. Calves soft and nontender. Neuro: Vibratory sense mildly decreased over the fingertips per tuning fork exam.  Skin: Healing, scabbed lesion left upper back. Port-A-Cath without erythema.    Lab Results:  Lab Results  Component Value Date   WBC 9.1 03/28/2016   HGB 10.7* 03/28/2016   HCT 32.4* 03/28/2016   MCV 89.5 03/28/2016   PLT 179 03/28/2016   NEUTROABS 6.7* 03/28/2016    Imaging:  No results found.  Medications: I have reviewed the patient's current medications.  Assessment/Plan: 1. 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  Cycle 4 FOLFIRINOX 02/01/2016  Cycle 5 FOLFIRINOX 02/15/2016  Cycle 6 FOLFIRINOX  02/29/2016  Cycle 7 FOLFIRINOX 03/14/2016  Cycle 8 FOLFIRINOX 03/28/2016  2. Diabetes  3. History of Anorexia/weight loss secondary to #1  4. Obstructive jaundice secondary to #1-status post placement of a percutaneous internal/external biliary drain 11/25/2015; biliary drainage catheter capped 12/02/2015; biliary drain internalized 12/13/2015  5. Port-A-Cath placement 11/30/2015 interventional radiology  6. Admission 01/17/2016 with a high fever and rigors-no apparent source for infection upon review of his history and examination, blood cultures positive for gram-positive cocci--viridans streptococcus--completed 2 weeks of IV ceftriaxone; no endocarditis on TTE.  7. Oxaliplatin neuropathy  8. Elevated transaminases-likely secondary to chemotherapy-improved   Disposition: Jacob Hardin appears stable. He has completed 7 cycles of FOLFIRINOX. Plan to proceed with cycle 8 today as scheduled. He is scheduled for restaging CT scans 04/09/2016. He will return for a follow-up visit 04/11/2016. He will contact the office in the interim with any problems.  Plan reviewed with Dr. [name].    [name] ANP/GNP-BC   03/28/2016  9:55 AM

## 2016-03-28 NOTE — Patient Instructions (Signed)
[facility] Discharge Instructions for Patients Receiving Chemotherapy  Today you received the following chemotherapy agents:  Oxaliplatin, Leucovorin, Irinotecan, Fluorouracil  To help prevent nausea and vomiting after your treatment, we encourage you to take your nausea medication as prescribed.   If you develop nausea and vomiting that is not controlled by your nausea medication, call the clinic.   BELOW ARE SYMPTOMS THAT SHOULD BE REPORTED IMMEDIATELY:  *FEVER GREATER THAN 100.5 F  *CHILLS WITH OR WITHOUT FEVER  NAUSEA AND VOMITING THAT IS NOT CONTROLLED WITH YOUR NAUSEA MEDICATION  *UNUSUAL SHORTNESS OF BREATH  *UNUSUAL BRUISING OR BLEEDING  TENDERNESS IN MOUTH AND THROAT WITH OR WITHOUT PRESENCE OF ULCERS  *URINARY PROBLEMS  *BOWEL PROBLEMS  UNUSUAL RASH Items with * indicate a potential emergency and should be followed up as soon as possible.  Feel free to call the clinic you have any questions or concerns. The clinic phone number is [phone].  Please show the CHEMO ALERT CARD at check-in to the Emergency Department and triage nurse.   

## 2016-03-28 NOTE — Progress Notes (Signed)
OK to infuse the Leucovorin for 90 mins per Arbie Cookey Pharmacist to run with the Irinotecan for 90 mins.

## 2016-03-28 NOTE — Progress Notes (Signed)
Nutrition follow-up completed with patient and wife, during infusion for pancreas cancer. Weight has decreased and was documented as 150.8 pounds June 7, down from 155 pounds. Patient reports decreased appetite and increased fatigue. Complains of taste alterations and increased cold sensitivity. Patient struggles with finding room temperature or warm foods. Potassium improved and documented as 4.1.  Nutrition diagnosis: Unintended weight loss continues.  Intervention: Educated patient on strategies for increased oral intake to minimize further weight loss. Reviewed warm foods, supplements and samples. Provided support and encouragement. Teach back method used.  Monitoring, evaluation, goals: Patient will tolerate increased calories and protein to promote maintenance of lean body mass.  Next visit: To be scheduled as needed.  **Disclaimer: This note was dictated with voice recognition software. Similar sounding words can inadvertently be transcribed and this note may contain transcription errors which may not have been corrected upon publication of note.**

## 2016-03-29 LAB — CANCER ANTIGEN 19-9: CAN 19-9: 169 U/mL — AB (ref 0–35)

## 2016-03-30 ENCOUNTER — Ambulatory Visit: Payer: Medicare Other

## 2016-03-30 ENCOUNTER — Ambulatory Visit (HOSPITAL_BASED_OUTPATIENT_CLINIC_OR_DEPARTMENT_OTHER): Payer: Medicare Other

## 2016-03-30 VITALS — BP 137/74 | HR 69 | Temp 98.2°F | Resp 18

## 2016-03-30 DIAGNOSIS — Z5189 Encounter for other specified aftercare: Secondary | ICD-10-CM

## 2016-03-30 DIAGNOSIS — Z452 Encounter for adjustment and management of vascular access device: Secondary | ICD-10-CM | POA: Diagnosis not present

## 2016-03-30 DIAGNOSIS — C250 Malignant neoplasm of head of pancreas: Secondary | ICD-10-CM | POA: Diagnosis not present

## 2016-03-30 MED ORDER — HEPARIN SOD (PORK) LOCK FLUSH 100 UNIT/ML IV SOLN
500.0000 [IU] | Freq: Once | INTRAVENOUS | Status: AC | PRN
  Administered 2016-03-30: 500 [IU]
  Filled 2016-03-30: qty 5

## 2016-03-30 MED ORDER — PEGFILGRASTIM INJECTION 6 MG/0.6ML ~~LOC~~
6.0000 mg | PREFILLED_SYRINGE | Freq: Once | SUBCUTANEOUS | Status: AC
  Administered 2016-03-30: 6 mg via SUBCUTANEOUS
  Filled 2016-03-30: qty 0.6

## 2016-03-30 MED ORDER — SODIUM CHLORIDE 0.9% FLUSH
10.0000 mL | INTRAVENOUS | Status: DC | PRN
  Administered 2016-03-30: 10 mL
  Filled 2016-03-30: qty 10

## 2016-03-30 NOTE — Patient Instructions (Signed)
Pegfilgrastim injection What is this medicine? PEGFILGRASTIM (PEG fil gra stim) is a long-acting granulocyte colony-stimulating factor that stimulates the growth of neutrophils, a type of white blood cell important in the body's fight against infection. It is used to reduce the incidence of fever and infection in patients with certain types of cancer who are receiving chemotherapy that affects the bone marrow, and to increase survival after being exposed to high doses of radiation. This medicine may be used for other purposes; ask your health care provider or pharmacist if you have questions. What should I tell my health care provider before I take this medicine? They need to know if you have any of these conditions: -kidney disease -latex allergy -ongoing radiation therapy -sickle cell disease -skin reactions to acrylic adhesives (On-Body Injector only) -an unusual or allergic reaction to pegfilgrastim, filgrastim, other medicines, foods, dyes, or preservatives -pregnant or trying to get pregnant -breast-feeding How should I use this medicine? This medicine is for injection under the skin. If you get this medicine at home, you will be taught how to prepare and give the pre-filled syringe or how to use the On-body Injector. Refer to the patient Instructions for Use for detailed instructions. Use exactly as directed. Take your medicine at regular intervals. Do not take your medicine more often than directed. It is important that you put your used needles and syringes in a special sharps container. Do not put them in a trash can. If you do not have a sharps container, call your pharmacist or healthcare provider to get one. Talk to your pediatrician regarding the use of this medicine in children. While this drug may be prescribed for selected conditions, precautions do apply. Overdosage: If you think you have taken too much of this medicine contact a poison control center or emergency room at  once. NOTE: This medicine is only for you. Do not share this medicine with others. What if I miss a dose? It is important not to miss your dose. Call your doctor or health care professional if you miss your dose. If you miss a dose due to an On-body Injector failure or leakage, a new dose should be administered as soon as possible using a single prefilled syringe for manual use. What may interact with this medicine? Interactions have not been studied. Give your health care provider a list of all the medicines, herbs, non-prescription drugs, or dietary supplements you use. Also tell them if you smoke, drink alcohol, or use illegal drugs. Some items may interact with your medicine. This list may not describe all possible interactions. Give your health care provider a list of all the medicines, herbs, non-prescription drugs, or dietary supplements you use. Also tell them if you smoke, drink alcohol, or use illegal drugs. Some items may interact with your medicine. What should I watch for while using this medicine? You may need blood work done while you are taking this medicine. If you are going to need a MRI, CT scan, or other procedure, tell your doctor that you are using this medicine (On-Body Injector only). What side effects may I notice from receiving this medicine? Side effects that you should report to your doctor or health care professional as soon as possible: -allergic reactions like skin rash, itching or hives, swelling of the face, lips, or tongue -dizziness -fever -pain, redness, or irritation at site where injected -pinpoint red spots on the skin -red or dark-brown urine -shortness of breath or breathing problems -stomach or side pain, or pain   at the shoulder -swelling -tiredness -trouble passing urine or change in the amount of urine Side effects that usually do not require medical attention (report to your doctor or health care professional if they continue or are  bothersome): -bone pain -muscle pain This list may not describe all possible side effects. Call your doctor for medical advice about side effects. You may report side effects to FDA at 1-800-FDA-1088. Where should I keep my medicine? Keep out of the reach of children. Store pre-filled syringes in a refrigerator between 2 and 8 degrees C (36 and 46 degrees F). Do not freeze. Keep in carton to protect from light. Throw away this medicine if it is left out of the refrigerator for more than 48 hours. Throw away any unused medicine after the expiration date. NOTE: This sheet is a summary. It may not cover all possible information. If you have questions about this medicine, talk to your doctor, pharmacist, or health care provider.    2016, Elsevier/Gold Standard. (2014-10-28 14:30:14)  

## 2016-03-30 NOTE — Progress Notes (Signed)
Completed in Infusion room 

## 2016-04-08 ENCOUNTER — Other Ambulatory Visit: Payer: Self-pay | Admitting: Oncology

## 2016-04-09 ENCOUNTER — Other Ambulatory Visit: Payer: Self-pay | Admitting: *Deleted

## 2016-04-09 ENCOUNTER — Ambulatory Visit (HOSPITAL_COMMUNITY)
Admission: RE | Admit: 2016-04-09 | Discharge: 2016-04-09 | Disposition: A | Discharge status: Home / Self Care | Payer: Medicare Other | Source: Ambulatory Visit | Attending: Oncology | Admitting: Oncology

## 2016-04-09 ENCOUNTER — Encounter (HOSPITAL_COMMUNITY): Payer: Self-pay

## 2016-04-09 ENCOUNTER — Telehealth: Payer: Self-pay | Admitting: Oncology

## 2016-04-09 DIAGNOSIS — C250 Malignant neoplasm of head of pancreas: Secondary | ICD-10-CM

## 2016-04-09 DIAGNOSIS — C787 Secondary malignant neoplasm of liver and intrahepatic bile duct: Secondary | ICD-10-CM | POA: Diagnosis not present

## 2016-04-09 MED ORDER — DIATRIZOATE MEGLUMINE & SODIUM 66-10 % PO SOLN: 30.0000 mL | Freq: Once | ORAL | Status: DC

## 2016-04-09 MED ORDER — DIATRIZOATE MEGLUMINE & SODIUM 66-10 % PO SOLN
30.0000 mL | Freq: Once | ORAL | Status: AC
  Administered 2016-04-09: 30 mL via ORAL

## 2016-04-09 MED ORDER — IOPAMIDOL (ISOVUE-300) INJECTION 61%: 100.0000 mL | Freq: Once | INTRAVENOUS | Status: DC | PRN

## 2016-04-09 MED ORDER — IOPAMIDOL (ISOVUE-300) INJECTION 61%
100.0000 mL | Freq: Once | INTRAVENOUS | Status: AC | PRN
  Administered 2016-04-09: 100 mL via INTRAVENOUS

## 2016-04-09 NOTE — Telephone Encounter (Signed)
inj was scheduled in correctly... I fixed it inj sched for 6/23

## 2016-04-11 ENCOUNTER — Ambulatory Visit: Payer: Medicare Other

## 2016-04-11 ENCOUNTER — Telehealth: Payer: Self-pay | Admitting: Oncology

## 2016-04-11 ENCOUNTER — Other Ambulatory Visit (HOSPITAL_BASED_OUTPATIENT_CLINIC_OR_DEPARTMENT_OTHER): Payer: Medicare Other

## 2016-04-11 ENCOUNTER — Ambulatory Visit (HOSPITAL_BASED_OUTPATIENT_CLINIC_OR_DEPARTMENT_OTHER): Payer: Medicare Other

## 2016-04-11 ENCOUNTER — Ambulatory Visit (HOSPITAL_BASED_OUTPATIENT_CLINIC_OR_DEPARTMENT_OTHER): Payer: Medicare Other | Admitting: Oncology

## 2016-04-11 ENCOUNTER — Other Ambulatory Visit: Payer: Self-pay | Admitting: *Deleted

## 2016-04-11 VITALS — BP 134/70 | HR 64 | Temp 98.2°F | Resp 18 | Ht 72.0 in | Wt 152.6 lb

## 2016-04-11 DIAGNOSIS — C787 Secondary malignant neoplasm of liver and intrahepatic bile duct: Secondary | ICD-10-CM

## 2016-04-11 DIAGNOSIS — Z5111 Encounter for antineoplastic chemotherapy: Secondary | ICD-10-CM

## 2016-04-11 DIAGNOSIS — R740 Nonspecific elevation of levels of transaminase and lactic acid dehydrogenase [LDH]: Secondary | ICD-10-CM | POA: Diagnosis not present

## 2016-04-11 DIAGNOSIS — C250 Malignant neoplasm of head of pancreas: Secondary | ICD-10-CM | POA: Diagnosis not present

## 2016-04-11 DIAGNOSIS — E119 Type 2 diabetes mellitus without complications: Secondary | ICD-10-CM

## 2016-04-11 DIAGNOSIS — Z452 Encounter for adjustment and management of vascular access device: Secondary | ICD-10-CM | POA: Diagnosis not present

## 2016-04-11 DIAGNOSIS — Z95828 Presence of other vascular implants and grafts: Secondary | ICD-10-CM

## 2016-04-11 DIAGNOSIS — G620 Drug-induced polyneuropathy: Secondary | ICD-10-CM

## 2016-04-11 LAB — CBC WITH DIFFERENTIAL/PLATELET
BASO%: 0.5 % (ref 0.0–2.0)
BASOS ABS: 0.1 10*3/uL (ref 0.0–0.1)
EOS ABS: 0.1 10*3/uL (ref 0.0–0.5)
EOS%: 1.3 % (ref 0.0–7.0)
HEMATOCRIT: 31.8 % — AB (ref 38.4–49.9)
HGB: 10.4 g/dL — ABNORMAL LOW (ref 13.0–17.1)
LYMPH#: 1 10*3/uL (ref 0.9–3.3)
LYMPH%: 11.1 % — ABNORMAL LOW (ref 14.0–49.0)
MCH: 29.7 pg (ref 27.2–33.4)
MCHC: 32.8 g/dL (ref 32.0–36.0)
MCV: 90.6 fL (ref 79.3–98.0)
MONO#: 1 10*3/uL — AB (ref 0.1–0.9)
MONO%: 10.2 % (ref 0.0–14.0)
NEUT#: 7.2 10*3/uL — ABNORMAL HIGH (ref 1.5–6.5)
NEUT%: 76.9 % — AB (ref 39.0–75.0)
PLATELETS: 170 10*3/uL (ref 140–400)
RBC: 3.51 10*6/uL — ABNORMAL LOW (ref 4.20–5.82)
RDW: 19.3 % — ABNORMAL HIGH (ref 11.0–14.6)
WBC: 9.4 10*3/uL (ref 4.0–10.3)

## 2016-04-11 LAB — COMPREHENSIVE METABOLIC PANEL
ALT: 53 U/L (ref 0–55)
ANION GAP: 8 meq/L (ref 3–11)
AST: 46 U/L — ABNORMAL HIGH (ref 5–34)
Albumin: 3.1 g/dL — ABNORMAL LOW (ref 3.5–5.0)
Alkaline Phosphatase: 479 U/L — ABNORMAL HIGH (ref 40–150)
BILIRUBIN TOTAL: 0.48 mg/dL (ref 0.20–1.20)
BUN: 11.7 mg/dL (ref 7.0–26.0)
CALCIUM: 9 mg/dL (ref 8.4–10.4)
CHLORIDE: 102 meq/L (ref 98–109)
CO2: 27 mEq/L (ref 22–29)
CREATININE: 0.9 mg/dL (ref 0.7–1.3)
EGFR: 85 mL/min/{1.73_m2} — AB (ref >90)
Glucose: 278 mg/dl — ABNORMAL HIGH (ref 70–140)
Potassium: 4.1 mEq/L (ref 3.5–5.1)
Sodium: 137 mEq/L (ref 136–145)
Total Protein: 6.2 g/dL — ABNORMAL LOW (ref 6.4–8.3)

## 2016-04-11 MED ORDER — POTASSIUM CHLORIDE CRYS ER 20 MEQ PO TBCR: 20.0000 meq | EXTENDED_RELEASE_TABLET | Freq: Every day | ORAL | Status: DC

## 2016-04-11 MED ORDER — SODIUM CHLORIDE 0.9 % IJ SOLN
10.0000 mL | INTRAMUSCULAR | Status: DC | PRN
  Administered 2016-04-11: 10 mL via INTRAVENOUS
  Filled 2016-04-11: qty 10

## 2016-04-11 MED ORDER — PALONOSETRON HCL INJECTION 0.25 MG/5ML
INTRAVENOUS | Status: AC
  Filled 2016-04-11: qty 5

## 2016-04-11 MED ORDER — SODIUM CHLORIDE 0.9 % IV SOLN
Freq: Once | INTRAVENOUS | Status: AC
  Administered 2016-04-11: 12:00:00 via INTRAVENOUS
  Filled 2016-04-11: qty 5

## 2016-04-11 MED ORDER — LEUCOVORIN CALCIUM INJECTION 350 MG
400.0000 mg/m2 | Freq: Once | INTRAVENOUS | Status: AC
  Administered 2016-04-11: 756 mg via INTRAVENOUS
  Filled 2016-04-11: qty 37.8

## 2016-04-11 MED ORDER — SODIUM CHLORIDE 0.9 % IV SOLN
2400.0000 mg/m2 | INTRAVENOUS | Status: DC
  Administered 2016-04-11: 4550 mg via INTRAVENOUS
  Filled 2016-04-11: qty 91

## 2016-04-11 MED ORDER — ATROPINE SULFATE 1 MG/ML IJ SOLN: 0.5000 mg | Freq: Once | INTRAMUSCULAR | Status: DC | PRN

## 2016-04-11 MED ORDER — PALONOSETRON HCL INJECTION 0.25 MG/5ML
0.2500 mg | Freq: Once | INTRAVENOUS | Status: AC
  Administered 2016-04-11: 0.25 mg via INTRAVENOUS

## 2016-04-11 MED ORDER — IRINOTECAN HCL CHEMO INJECTION 100 MG/5ML
180.0000 mg/m2 | Freq: Once | INTRAVENOUS | Status: AC
  Administered 2016-04-11: 340 mg via INTRAVENOUS
  Filled 2016-04-11: qty 5.67

## 2016-04-11 NOTE — Patient Instructions (Signed)

## 2016-04-11 NOTE — Telephone Encounter (Signed)
Pt will get updated sched in tx room °

## 2016-04-11 NOTE — Progress Notes (Signed)
[place] OFFICE PROGRESS NOTE   Diagnosis: pancreas cancer INTERVAL HISTORY:   [name] returns as scheduled.he completed another cycle FOLFIRINOX on 03/28/2016. He reports prolonged cold sensitivity following chemotherapy. He has mild numbness in the fingers. He had malaise following chemotherapy. Good appetite. The malaise has improved. No pain or diarrhea.  Objective:  Vital signs in last 24 hours:  Blood pressure 134/70, pulse 64, temperature 98.2 F (36.8 C), temperature source Oral, resp. rate 18, height 6' (1.829 m), weight 152 lb 9.6 oz (69.219 kg), SpO2 100 %.    HEENT: no thrush or ulcers Resp: lungs clear bilaterally Cardio: regular rate and rhythm GI: no hepatomegaly, no mass, nontender Vascular: no leg edema    Portacath/PICC-without erythema  Lab Results:  Lab Results  Component Value Date   WBC 9.4 04/11/2016   HGB 10.4* 04/11/2016   HCT 31.8* 04/11/2016   MCV 90.6 04/11/2016   PLT 170 04/11/2016   NEUTROABS 7.2* 04/11/2016   CA 19-9 on 03/28/2016: 169   Imaging:  Ct Abdomen Pelvis W Contrast  04/09/2016  CLINICAL DATA:  Pancreatic cancer diagnosed 5 months ago status post biliary stenting. Chemotherapy in progress. 30 pound weight loss over the last 4 months. EXAM: CT ABDOMEN AND PELVIS WITH CONTRAST TECHNIQUE: Multidetector CT imaging of the abdomen and pelvis was performed using the standard protocol following bolus administration of intravenous contrast. CONTRAST:  14mL ISOVUE-300 IOPAMIDOL (ISOVUE-300) INJECTION 61% COMPARISON:  Abdominal MRI 11/15/2015 and CT 01/18/2016. FINDINGS: Lower chest: Clear lung bases. No significant pleural or pericardial effusion. Hepatobiliary: The previously demonstrated multiple hepatic metastases have decreased in size and density. Several of the previously measured lesions are no longer measurable. Remaining lesions include, as measured on series 4: A 1.7 x 1.5 cm lesion anteriorly in the right  hepatic lobe on image 17 (previously 2.1 x 1.9 cm), a 1.2 x 1.3 cm lesion in the left hepatic lobe on image 16 (previously 1.6 x 1.4 cm), and a 2.0 x 1.6 cm lesion inferiorly in the right lobe on image 33 (previously 2.5 x 2.1 cm). No new or enlarging lesions are identified. Biliary stent appears unchanged and patent with persistent pneumobilia. Pancreas: The pancreas is atrophied with stable mild pancreatic ductal dilatation. The pancreatic head mass remains ill-defined and difficult to measure, although is subjectively smaller, measuring approximately 2.0 x 1.5 cm on image 30 (previously 3.2 x 2.2 cm). Spleen: Normal in size without focal abnormality. Adrenals/Urinary Tract: Both adrenal glands appear normal. The kidneys appear normal without evidence of urinary tract calculus, suspicious lesion or hydronephrosis. No bladder abnormalities are seen. Stomach/Bowel: No evidence of bowel wall thickening, distention or surrounding inflammatory change. Duodenal stent appears unchanged in position and patent. Vascular/Lymphatic: There are no enlarged abdominal or pelvic lymph nodes. Small peripancreatic lymph nodes, best seen on the coronal images, are unchanged. There is diffuse aortic and branch vessel atherosclerosis. No significant venous abnormalities are identified. The superior mesenteric, splenic and portal veins remain patent. Reproductive: Unremarkable. Other: No ascites or peritoneal nodularity. Musculoskeletal: No acute or significant osseous findings. IMPRESSION: 1. Interval further improvement in the pancreatic head mass and the multifocal hepatic metastatic disease. 2. No new disease identified. 3. Patency of the biliary and duodenal stents. Electronically Signed   By: Richardean Sale M.D.   On: 04/09/2016 09:49    Medications: I have reviewed the patient's current medications.  Assessment/Plan: 1. Pancreas cancer, stage IV, pancreas head mass, status post an EUS biopsy 11/18/2015 confirming  adenocarcinoma  Upper endoscopy 11/18/2015 confirmed duodenal invasion/obstruction with a biopsy confirming adenocarcinoma  Placement of a duodenal stent 11/22/2015  MRI abdomen 11/15/2015 revealed a pancreas head mass and liver metastases  Cycle 1 FOLFIRINOX 12/06/2015  Cycle 2 FOLFIRINOX 12/21/2015  Cycle 3 FOLFIRINOX 01/04/2016  CT abdomen/pelvis 01/19/2016 showed improvement in the pancreatic head mass and liver metastases.  Cycle 4 FOLFIRINOX 02/01/2016  Cycle 5 FOLFIRINOX 02/15/2016  Cycle 6 FOLFIRINOX 02/29/2016  Cycle 7 FOLFIRINOX 03/14/2016  Cycle 8 FOLFIRINOX 03/28/2016  CT abdomen/pelvis 04/09/2016-improvement in pancreas head mass and liver metastases, no new metastatic disease  Chemotherapy switch to FOLFIRI every 3 weeks beginning 04/11/2016  2. Diabetes  3. History of Anorexia/weight loss secondary to #1  4. Obstructive jaundice secondary to #1-status post placement of a percutaneous internal/external biliary drain 11/25/2015; biliary drainage catheter capped 12/02/2015; biliary drain internalized 12/13/2015  5. Port-A-Cath placement 11/30/2015 interventional radiology  6. Admission 01/17/2016 with a high fever and rigors-no apparent source for infection upon review of his history and examination, blood cultures positive for gram-positive cocci--viridans streptococcus--completed 2 weeks of IV ceftriaxone; no endocarditis on TTE.  7. Oxaliplatin neuropathy  8. Elevated transaminases-likely secondary to chemotherapy-improved   Disposition:  [name] appears stable. He has completed 8 cycles of FOLFIRINOX therapy. The restaging CT confirms improvement in the liver metastases and pancreas mass. He has developed increased malaise following chemotherapy and neuropathy symptoms. We decided to delete oxaliplatin from the chemotherapy regimen. He will continue chemotherapy with FOLFIRI every 3 weeks. He will complete a cycle of FOLFIRI  today.  [name] will return for an office visit and chemotherapy in 3 weeks.  Betsy Coder, MD  04/11/2016  10:40 AM

## 2016-04-11 NOTE — Patient Instructions (Signed)
[place] Discharge Instructions for Patients Receiving Chemotherapy  Today you received the following chemotherapy agents:  Leucovorin, Irinotecan, Fluorouracil  To help prevent nausea and vomiting after your treatment, we encourage you to take your nausea medication as prescribed.   If you develop nausea and vomiting that is not controlled by your nausea medication, call the clinic.   BELOW ARE SYMPTOMS THAT SHOULD BE REPORTED IMMEDIATELY:  *FEVER GREATER THAN 100.5 F  *CHILLS WITH OR WITHOUT FEVER  NAUSEA AND VOMITING THAT IS NOT CONTROLLED WITH YOUR NAUSEA MEDICATION  *UNUSUAL SHORTNESS OF BREATH  *UNUSUAL BRUISING OR BLEEDING  TENDERNESS IN MOUTH AND THROAT WITH OR WITHOUT PRESENCE OF ULCERS  *URINARY PROBLEMS  *BOWEL PROBLEMS  UNUSUAL RASH Items with * indicate a potential emergency and should be followed up as soon as possible.  Feel free to call the clinic you have any questions or concerns. The clinic phone number is [phone].  Please show the [place] at check-in to the Emergency Department and triage nurse.

## 2016-04-12 LAB — CANCER ANTIGEN 19-9: CA 19-9: 154 U/mL — ABNORMAL HIGH (ref 0–35)

## 2016-04-13 ENCOUNTER — Ambulatory Visit (HOSPITAL_BASED_OUTPATIENT_CLINIC_OR_DEPARTMENT_OTHER): Payer: Medicare Other

## 2016-04-13 ENCOUNTER — Ambulatory Visit: Payer: Medicare Other

## 2016-04-13 VITALS — BP 120/68 | HR 64 | Temp 98.2°F | Resp 16

## 2016-04-13 DIAGNOSIS — Z452 Encounter for adjustment and management of vascular access device: Secondary | ICD-10-CM | POA: Diagnosis not present

## 2016-04-13 DIAGNOSIS — C250 Malignant neoplasm of head of pancreas: Secondary | ICD-10-CM

## 2016-04-13 DIAGNOSIS — Z5189 Encounter for other specified aftercare: Secondary | ICD-10-CM

## 2016-04-13 MED ORDER — PEGFILGRASTIM INJECTION 6 MG/0.6ML ~~LOC~~
6.0000 mg | PREFILLED_SYRINGE | Freq: Once | SUBCUTANEOUS | Status: AC
  Administered 2016-04-13: 6 mg via SUBCUTANEOUS
  Filled 2016-04-13: qty 0.6

## 2016-04-13 MED ORDER — SODIUM CHLORIDE 0.9% FLUSH
10.0000 mL | INTRAVENOUS | Status: DC | PRN
  Administered 2016-04-13: 10 mL
  Filled 2016-04-13: qty 10

## 2016-04-13 MED ORDER — HEPARIN SOD (PORK) LOCK FLUSH 100 UNIT/ML IV SOLN
500.0000 [IU] | Freq: Once | INTRAVENOUS | Status: AC | PRN
  Administered 2016-04-13: 500 [IU]
  Filled 2016-04-13: qty 5

## 2016-04-13 NOTE — Progress Notes (Signed)
Completed in Flush Room

## 2016-04-13 NOTE — Patient Instructions (Signed)
Pegfilgrastim injection What is this medicine? PEGFILGRASTIM (PEG fil gra stim) is a long-acting granulocyte colony-stimulating factor that stimulates the growth of neutrophils, a type of white blood cell important in the body's fight against infection. It is used to reduce the incidence of fever and infection in patients with certain types of cancer who are receiving chemotherapy that affects the bone marrow, and to increase survival after being exposed to high doses of radiation. This medicine may be used for other purposes; ask your health care provider or pharmacist if you have questions. What should I tell my health care provider before I take this medicine? They need to know if you have any of these conditions: -kidney disease -latex allergy -ongoing radiation therapy -sickle cell disease -skin reactions to acrylic adhesives (On-Body Injector only) -an unusual or allergic reaction to pegfilgrastim, filgrastim, other medicines, foods, dyes, or preservatives -pregnant or trying to get pregnant -breast-feeding How should I use this medicine? This medicine is for injection under the skin. If you get this medicine at home, you will be taught how to prepare and give the pre-filled syringe or how to use the On-body Injector. Refer to the patient Instructions for Use for detailed instructions. Use exactly as directed. Take your medicine at regular intervals. Do not take your medicine more often than directed. It is important that you put your used needles and syringes in a special sharps container. Do not put them in a trash can. If you do not have a sharps container, call your pharmacist or healthcare provider to get one. Talk to your pediatrician regarding the use of this medicine in children. While this drug may be prescribed for selected conditions, precautions do apply. Overdosage: If you think you have taken too much of this medicine contact a poison control center or emergency room at  once. NOTE: This medicine is only for you. Do not share this medicine with others. What if I miss a dose? It is important not to miss your dose. Call your doctor or health care professional if you miss your dose. If you miss a dose due to an On-body Injector failure or leakage, a new dose should be administered as soon as possible using a single prefilled syringe for manual use. What may interact with this medicine? Interactions have not been studied. Give your health care provider a list of all the medicines, herbs, non-prescription drugs, or dietary supplements you use. Also tell them if you smoke, drink alcohol, or use illegal drugs. Some items may interact with your medicine. This list may not describe all possible interactions. Give your health care provider a list of all the medicines, herbs, non-prescription drugs, or dietary supplements you use. Also tell them if you smoke, drink alcohol, or use illegal drugs. Some items may interact with your medicine. What should I watch for while using this medicine? You may need blood work done while you are taking this medicine. If you are going to need a MRI, CT scan, or other procedure, tell your doctor that you are using this medicine (On-Body Injector only). What side effects may I notice from receiving this medicine? Side effects that you should report to your doctor or health care professional as soon as possible: -allergic reactions like skin rash, itching or hives, swelling of the face, lips, or tongue -dizziness -fever -pain, redness, or irritation at site where injected -pinpoint red spots on the skin -red or dark-brown urine -shortness of breath or breathing problems -stomach or side pain, or pain   at the shoulder -swelling -tiredness -trouble passing urine or change in the amount of urine Side effects that usually do not require medical attention (report to your doctor or health care professional if they continue or are  bothersome): -bone pain -muscle pain This list may not describe all possible side effects. Call your doctor for medical advice about side effects. You may report side effects to FDA at 1-800-FDA-1088. Where should I keep my medicine? Keep out of the reach of children. Store pre-filled syringes in a refrigerator between 2 and 8 degrees C (36 and 46 degrees F). Do not freeze. Keep in carton to protect from light. Throw away this medicine if it is left out of the refrigerator for more than 48 hours. Throw away any unused medicine after the expiration date. NOTE: This sheet is a summary. It may not cover all possible information. If you have questions about this medicine, talk to your doctor, pharmacist, or health care provider.    2016, Elsevier/Gold Standard. (2014-10-28 14:30:14)  

## 2016-04-27 ENCOUNTER — Other Ambulatory Visit: Payer: Self-pay | Admitting: Nurse Practitioner

## 2016-04-29 ENCOUNTER — Other Ambulatory Visit: Payer: Self-pay | Admitting: Oncology

## 2016-05-02 ENCOUNTER — Encounter: Payer: Self-pay | Admitting: *Deleted

## 2016-05-02 ENCOUNTER — Ambulatory Visit (HOSPITAL_BASED_OUTPATIENT_CLINIC_OR_DEPARTMENT_OTHER): Payer: Medicare Other | Admitting: Nurse Practitioner

## 2016-05-02 ENCOUNTER — Other Ambulatory Visit (HOSPITAL_BASED_OUTPATIENT_CLINIC_OR_DEPARTMENT_OTHER): Payer: Medicare Other

## 2016-05-02 ENCOUNTER — Telehealth: Payer: Self-pay | Admitting: Oncology

## 2016-05-02 ENCOUNTER — Ambulatory Visit (HOSPITAL_BASED_OUTPATIENT_CLINIC_OR_DEPARTMENT_OTHER): Payer: Medicare Other

## 2016-05-02 VITALS — BP 131/68 | HR 63 | Temp 98.3°F | Resp 18 | Ht 72.0 in | Wt 154.4 lb

## 2016-05-02 DIAGNOSIS — Z5111 Encounter for antineoplastic chemotherapy: Secondary | ICD-10-CM

## 2016-05-02 DIAGNOSIS — C250 Malignant neoplasm of head of pancreas: Secondary | ICD-10-CM

## 2016-05-02 DIAGNOSIS — G620 Drug-induced polyneuropathy: Secondary | ICD-10-CM | POA: Diagnosis not present

## 2016-05-02 DIAGNOSIS — C170 Malignant neoplasm of duodenum: Secondary | ICD-10-CM

## 2016-05-02 DIAGNOSIS — E119 Type 2 diabetes mellitus without complications: Secondary | ICD-10-CM

## 2016-05-02 DIAGNOSIS — C787 Secondary malignant neoplasm of liver and intrahepatic bile duct: Secondary | ICD-10-CM | POA: Diagnosis not present

## 2016-05-02 DIAGNOSIS — Z452 Encounter for adjustment and management of vascular access device: Secondary | ICD-10-CM

## 2016-05-02 DIAGNOSIS — R740 Nonspecific elevation of levels of transaminase and lactic acid dehydrogenase [LDH]: Secondary | ICD-10-CM

## 2016-05-02 DIAGNOSIS — Z95828 Presence of other vascular implants and grafts: Secondary | ICD-10-CM

## 2016-05-02 LAB — COMPREHENSIVE METABOLIC PANEL
ALT: 43 U/L (ref 0–55)
ANION GAP: 8 meq/L (ref 3–11)
AST: 46 U/L — ABNORMAL HIGH (ref 5–34)
Albumin: 3 g/dL — ABNORMAL LOW (ref 3.5–5.0)
Alkaline Phosphatase: 584 U/L — ABNORMAL HIGH (ref 40–150)
BILIRUBIN TOTAL: 0.61 mg/dL (ref 0.20–1.20)
BUN: 10.6 mg/dL (ref 7.0–26.0)
CHLORIDE: 102 meq/L (ref 98–109)
CO2: 26 meq/L (ref 22–29)
Calcium: 8.8 mg/dL (ref 8.4–10.4)
Creatinine: 0.8 mg/dL (ref 0.7–1.3)
EGFR: 90 mL/min/{1.73_m2} — AB (ref >90)
GLUCOSE: 200 mg/dL — AB (ref 70–140)
POTASSIUM: 3.5 meq/L (ref 3.5–5.1)
SODIUM: 137 meq/L (ref 136–145)
TOTAL PROTEIN: 6.3 g/dL — AB (ref 6.4–8.3)

## 2016-05-02 LAB — CBC WITH DIFFERENTIAL/PLATELET
BASO%: 0.7 % (ref 0.0–2.0)
BASOS ABS: 0 10*3/uL (ref 0.0–0.1)
EOS%: 1.8 % (ref 0.0–7.0)
Eosinophils Absolute: 0.1 10*3/uL (ref 0.0–0.5)
HCT: 30.5 % — ABNORMAL LOW (ref 38.4–49.9)
HGB: 10.1 g/dL — ABNORMAL LOW (ref 13.0–17.1)
LYMPH%: 18 % (ref 14.0–49.0)
MCH: 30.1 pg (ref 27.2–33.4)
MCHC: 33.1 g/dL (ref 32.0–36.0)
MCV: 91 fL (ref 79.3–98.0)
MONO#: 0.8 10*3/uL (ref 0.1–0.9)
MONO%: 13.8 % (ref 0.0–14.0)
NEUT#: 3.9 10*3/uL (ref 1.5–6.5)
NEUT%: 65.7 % (ref 39.0–75.0)
NRBC: 0 % (ref 0–0)
PLATELETS: 167 10*3/uL (ref 140–400)
RBC: 3.35 10*6/uL — AB (ref 4.20–5.82)
RDW: 17.1 % — AB (ref 11.0–14.6)
WBC: 6 10*3/uL (ref 4.0–10.3)
lymph#: 1.1 10*3/uL (ref 0.9–3.3)

## 2016-05-02 MED ORDER — SODIUM CHLORIDE 0.9 % IV SOLN
Freq: Once | INTRAVENOUS | Status: AC
  Administered 2016-05-02: 13:00:00 via INTRAVENOUS

## 2016-05-02 MED ORDER — LEUCOVORIN CALCIUM INJECTION 350 MG
400.0000 mg/m2 | Freq: Once | INTRAMUSCULAR | Status: AC
  Administered 2016-05-02: 756 mg via INTRAVENOUS
  Filled 2016-05-02: qty 37.8

## 2016-05-02 MED ORDER — IRINOTECAN HCL CHEMO INJECTION 100 MG/5ML
180.0000 mg/m2 | Freq: Once | INTRAVENOUS | Status: AC
  Administered 2016-05-02: 340 mg via INTRAVENOUS
  Filled 2016-05-02: qty 5.67

## 2016-05-02 MED ORDER — PALONOSETRON HCL INJECTION 0.25 MG/5ML
0.2500 mg | Freq: Once | INTRAVENOUS | Status: AC
  Administered 2016-05-02: 0.25 mg via INTRAVENOUS

## 2016-05-02 MED ORDER — SODIUM CHLORIDE 0.9 % IJ SOLN
10.0000 mL | INTRAMUSCULAR | Status: DC | PRN
  Administered 2016-05-02: 10 mL via INTRAVENOUS
  Filled 2016-05-02: qty 10

## 2016-05-02 MED ORDER — SODIUM CHLORIDE 0.9 % IV SOLN
2400.0000 mg/m2 | INTRAVENOUS | Status: DC
  Administered 2016-05-02: 4550 mg via INTRAVENOUS
  Filled 2016-05-02: qty 91

## 2016-05-02 MED ORDER — PALONOSETRON HCL INJECTION 0.25 MG/5ML
INTRAVENOUS | Status: AC
  Filled 2016-05-02: qty 5

## 2016-05-02 MED ORDER — SODIUM CHLORIDE 0.9 % IV SOLN
Freq: Once | INTRAVENOUS | Status: AC
  Administered 2016-05-02: 13:00:00 via INTRAVENOUS
  Filled 2016-05-02: qty 5

## 2016-05-02 NOTE — Telephone Encounter (Signed)
Gave patient avs report and appointments for July and August.  °

## 2016-05-02 NOTE — Patient Instructions (Signed)

## 2016-05-02 NOTE — Progress Notes (Signed)
Oncology Nurse Navigator Documentation  Oncology Nurse Navigator Flowsheets 05/02/2016  Navigator Location [facility]  Navigator Encounter Type Treatment  Abnormal Finding Date -  Confirmed Diagnosis Date -  Treatment Initiated Date -  Patient Visit Type MedOnc  Treatment Phase Active Tx  Barriers/Navigation Needs No barriers at this time;No Questions;No Needs  Education -  Interventions None required  Education Method -  Support Groups/Services Feeling well and tolerating the chemo at every 3 week interval now. Planning on an anniversary cruise in August. Has gained a few lbs back according to patient.   Acuity -  Time Spent with Patient 15

## 2016-05-02 NOTE — Progress Notes (Signed)
  [place] OFFICE PROGRESS NOTE   Diagnosis:  Pancreas cancer  INTERVAL HISTORY:   Mr. Steinhaus returns as scheduled. He completed cycle 8 FOLFIRINOX 03/28/2016. CT abdomen/pelvis 04/09/2016 showed improvement. Oxaliplatin deleted and FOLFIRI continued on a 3 week schedule beginning 04/11/2016.  He denies nausea/vomiting. No mouth sores. No diarrhea. Cold sensitivity is better. No numbness or tingling in the absence of cold exposure. He has a good appetite. He has gained some weight. He denies pain.  Objective:  Vital signs in last 24 hours:  Blood pressure 131/68, pulse 63, temperature 98.3 F (36.8 C), temperature source Oral, resp. rate 18, height 6' (1.829 m), weight 154 lb 6.4 oz (70.035 kg), SpO2 100 %.    HEENT: No thrush or ulcers. Resp: Lungs clear bilaterally. Cardio: Regular rate and rhythm. GI: Abdomen soft and nontender. No hepatomegaly. No mass. Vascular: No leg edema. Port-A-Cath without erythema.   Lab Results:  Lab Results  Component Value Date   WBC 6.0 05/02/2016   HGB 10.1* 05/02/2016   HCT 30.5* 05/02/2016   MCV 91.0 05/02/2016   PLT 167 05/02/2016   NEUTROABS 3.9 05/02/2016    Imaging:  No results found.  Medications: I have reviewed the patient's current medications.  Assessment/Plan: 1. 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  Cycle 4 FOLFIRINOX 02/01/2016  Cycle 5 FOLFIRINOX 02/15/2016  Cycle 6 FOLFIRINOX 02/29/2016  Cycle 7  FOLFIRINOX 03/14/2016  Cycle 8 FOLFIRINOX 03/28/2016  CT abdomen/pelvis 04/09/2016-improvement in pancreas head mass and liver metastases, no new metastatic disease  Chemotherapy switch to FOLFIRI every 3 weeks beginning 04/11/2016  2. Diabetes  3. History of Anorexia/weight loss secondary to #1  4. Obstructive jaundice secondary to #1-status post placement of a percutaneous internal/external biliary drain 11/25/2015; biliary drainage catheter capped 12/02/2015; biliary drain internalized 12/13/2015  5. Port-A-Cath placement 11/30/2015 interventional radiology  6. Admission 01/17/2016 with a high fever and rigors-no apparent source for infection upon review of his history and examination, blood cultures positive for gram-positive cocci--viridans streptococcus--completed 2 weeks of IV ceftriaxone; no endocarditis on TTE.  7. Oxaliplatin neuropathy  8. Elevated transaminases-likely secondary to chemotherapy-improved   Disposition: Mr. Fly appears stable. Plan to continue FOLFIRI every 3 weeks. He will receive treatment today. We will see him in follow-up on 05/21/2016. He will contact the office in the interim with any problems.    [name] ANP/GNP-BC   05/02/2016  11:59 AM

## 2016-05-02 NOTE — Patient Instructions (Signed)
[facility] Discharge Instructions for Patients Receiving Chemotherapy  Today you received the following chemotherapy agents :  Camptosar, Leucovorin, Fluorouracil.  To help prevent nausea and vomiting after your treatment, we encourage you to take your nausea medication as prescribed.   If you develop nausea and vomiting that is not controlled by your nausea medication, call the clinic.   BELOW ARE SYMPTOMS THAT SHOULD BE REPORTED IMMEDIATELY:  *FEVER GREATER THAN 100.5 F  *CHILLS WITH OR WITHOUT FEVER  NAUSEA AND VOMITING THAT IS NOT CONTROLLED WITH YOUR NAUSEA MEDICATION  *UNUSUAL SHORTNESS OF BREATH  *UNUSUAL BRUISING OR BLEEDING  TENDERNESS IN MOUTH AND THROAT WITH OR WITHOUT PRESENCE OF ULCERS  *URINARY PROBLEMS  *BOWEL PROBLEMS  UNUSUAL RASH Items with * indicate a potential emergency and should be followed up as soon as possible.  Feel free to call the clinic you have any questions or concerns. The clinic phone number is [phone].  Please show the CHEMO ALERT CARD at check-in to the Emergency Department and triage nurse.   

## 2016-05-03 ENCOUNTER — Telehealth: Payer: Self-pay | Admitting: *Deleted

## 2016-05-03 ENCOUNTER — Encounter: Payer: Self-pay | Admitting: *Deleted

## 2016-05-03 ENCOUNTER — Telehealth: Payer: Self-pay

## 2016-05-03 LAB — CANCER ANTIGEN 19-9: CA 19-9: 133 U/mL — ABNORMAL HIGH (ref 0–35)

## 2016-05-03 NOTE — Telephone Encounter (Signed)
Spoke with patient and then later wife.  The chemotherapy he got yesterday seemed to be much harder on him than the last cycle. (He received emend/dec and aloxi just as in previous cycles).   By the time he got home he was feeling "really bad."   Not really nauseated -just wiped out.  Spoke with wife. He went to bed at 7:30 last night and did not get up until this morning.  She gave him compazine last night and this am.  Didn't really seem to make much difference - however, it is not that he is so nauseated - just feeling bad.  He is drinking fluids.  She thinks he is doing better now and he was able to eat some  lunch.  She knows to to call us if he does not continue to feel better or if he is not able to take po fluids.

## 2016-05-03 NOTE — Telephone Encounter (Signed)
Called and informed patient and his wife of CA 19-9 results. Patient verbalized understanding and denies any questions or concerns at this time.

## 2016-05-03 NOTE — Telephone Encounter (Signed)
-----   Message from Jacob Pier, MD sent at 05/03/2016  9:41 AM EDT ----- Please call patient, ca19-9 is better

## 2016-05-04 ENCOUNTER — Ambulatory Visit (HOSPITAL_BASED_OUTPATIENT_CLINIC_OR_DEPARTMENT_OTHER): Payer: Medicare Other

## 2016-05-04 ENCOUNTER — Ambulatory Visit: Payer: Medicare Other

## 2016-05-04 VITALS — BP 117/66 | HR 65 | Temp 98.1°F | Resp 17

## 2016-05-04 DIAGNOSIS — Z5189 Encounter for other specified aftercare: Secondary | ICD-10-CM

## 2016-05-04 DIAGNOSIS — Z452 Encounter for adjustment and management of vascular access device: Secondary | ICD-10-CM

## 2016-05-04 DIAGNOSIS — C250 Malignant neoplasm of head of pancreas: Secondary | ICD-10-CM

## 2016-05-04 MED ORDER — PEGFILGRASTIM INJECTION 6 MG/0.6ML ~~LOC~~
6.0000 mg | PREFILLED_SYRINGE | Freq: Once | SUBCUTANEOUS | Status: AC
  Administered 2016-05-04: 6 mg via SUBCUTANEOUS
  Filled 2016-05-04: qty 0.6

## 2016-05-04 MED ORDER — HEPARIN SOD (PORK) LOCK FLUSH 100 UNIT/ML IV SOLN
500.0000 [IU] | Freq: Once | INTRAVENOUS | Status: AC | PRN
  Administered 2016-05-04: 500 [IU]
  Filled 2016-05-04: qty 5

## 2016-05-04 MED ORDER — SODIUM CHLORIDE 0.9% FLUSH
10.0000 mL | INTRAVENOUS | Status: DC | PRN
  Administered 2016-05-04: 10 mL
  Filled 2016-05-04: qty 10

## 2016-05-04 NOTE — Patient Instructions (Signed)
Pegfilgrastim injection What is this medicine? PEGFILGRASTIM (PEG fil gra stim) is a long-acting granulocyte colony-stimulating factor that stimulates the growth of neutrophils, a type of white blood cell important in the body's fight against infection. It is used to reduce the incidence of fever and infection in patients with certain types of cancer who are receiving chemotherapy that affects the bone marrow, and to increase survival after being exposed to high doses of radiation. This medicine may be used for other purposes; ask your health care provider or pharmacist if you have questions. What should I tell my health care provider before I take this medicine? They need to know if you have any of these conditions: -kidney disease -latex allergy -ongoing radiation therapy -sickle cell disease -skin reactions to acrylic adhesives (On-Body Injector only) -an unusual or allergic reaction to pegfilgrastim, filgrastim, other medicines, foods, dyes, or preservatives -pregnant or trying to get pregnant -breast-feeding How should I use this medicine? This medicine is for injection under the skin. If you get this medicine at home, you will be taught how to prepare and give the pre-filled syringe or how to use the On-body Injector. Refer to the patient Instructions for Use for detailed instructions. Use exactly as directed. Take your medicine at regular intervals. Do not take your medicine more often than directed. It is important that you put your used needles and syringes in a special sharps container. Do not put them in a trash can. If you do not have a sharps container, call your pharmacist or healthcare provider to get one. Talk to your pediatrician regarding the use of this medicine in children. While this drug may be prescribed for selected conditions, precautions do apply. Overdosage: If you think you have taken too much of this medicine contact a poison control center or emergency room at  once. NOTE: This medicine is only for you. Do not share this medicine with others. What if I miss a dose? It is important not to miss your dose. Call your doctor or health care professional if you miss your dose. If you miss a dose due to an On-body Injector failure or leakage, a new dose should be administered as soon as possible using a single prefilled syringe for manual use. What may interact with this medicine? Interactions have not been studied. Give your health care provider a list of all the medicines, herbs, non-prescription drugs, or dietary supplements you use. Also tell them if you smoke, drink alcohol, or use illegal drugs. Some items may interact with your medicine. This list may not describe all possible interactions. Give your health care provider a list of all the medicines, herbs, non-prescription drugs, or dietary supplements you use. Also tell them if you smoke, drink alcohol, or use illegal drugs. Some items may interact with your medicine. What should I watch for while using this medicine? You may need blood work done while you are taking this medicine. If you are going to need a MRI, CT scan, or other procedure, tell your doctor that you are using this medicine (On-Body Injector only). What side effects may I notice from receiving this medicine? Side effects that you should report to your doctor or health care professional as soon as possible: -allergic reactions like skin rash, itching or hives, swelling of the face, lips, or tongue -dizziness -fever -pain, redness, or irritation at site where injected -pinpoint red spots on the skin -red or dark-brown urine -shortness of breath or breathing problems -stomach or side pain, or pain   at the shoulder -swelling -tiredness -trouble passing urine or change in the amount of urine Side effects that usually do not require medical attention (report to your doctor or health care professional if they continue or are  bothersome): -bone pain -muscle pain This list may not describe all possible side effects. Call your doctor for medical advice about side effects. You may report side effects to FDA at 1-800-FDA-1088. Where should I keep my medicine? Keep out of the reach of children. Store pre-filled syringes in a refrigerator between 2 and 8 degrees C (36 and 46 degrees F). Do not freeze. Keep in carton to protect from light. Throw away this medicine if it is left out of the refrigerator for more than 48 hours. Throw away any unused medicine after the expiration date. NOTE: This sheet is a summary. It may not cover all possible information. If you have questions about this medicine, talk to your doctor, pharmacist, or health care provider.    2016, Elsevier/Gold Standard. (2014-10-28 14:30:14)  

## 2016-05-20 ENCOUNTER — Other Ambulatory Visit: Payer: Self-pay | Admitting: Oncology

## 2016-05-21 ENCOUNTER — Other Ambulatory Visit: Payer: Self-pay | Admitting: *Deleted

## 2016-05-21 ENCOUNTER — Other Ambulatory Visit (HOSPITAL_BASED_OUTPATIENT_CLINIC_OR_DEPARTMENT_OTHER): Payer: Medicare Other

## 2016-05-21 ENCOUNTER — Ambulatory Visit (HOSPITAL_BASED_OUTPATIENT_CLINIC_OR_DEPARTMENT_OTHER): Payer: Medicare Other

## 2016-05-21 ENCOUNTER — Encounter: Payer: Self-pay | Admitting: Nurse Practitioner

## 2016-05-21 ENCOUNTER — Ambulatory Visit: Payer: Medicare Other

## 2016-05-21 ENCOUNTER — Telehealth: Payer: Self-pay | Admitting: Oncology

## 2016-05-21 ENCOUNTER — Ambulatory Visit (HOSPITAL_BASED_OUTPATIENT_CLINIC_OR_DEPARTMENT_OTHER): Payer: Medicare Other | Admitting: Nurse Practitioner

## 2016-05-21 VITALS — BP 135/71 | HR 64 | Temp 98.1°F | Resp 18 | Ht 72.0 in | Wt 150.4 lb

## 2016-05-21 DIAGNOSIS — C250 Malignant neoplasm of head of pancreas: Secondary | ICD-10-CM | POA: Diagnosis not present

## 2016-05-21 DIAGNOSIS — R740 Nonspecific elevation of levels of transaminase and lactic acid dehydrogenase [LDH]: Secondary | ICD-10-CM

## 2016-05-21 DIAGNOSIS — C787 Secondary malignant neoplasm of liver and intrahepatic bile duct: Secondary | ICD-10-CM

## 2016-05-21 DIAGNOSIS — G620 Drug-induced polyneuropathy: Secondary | ICD-10-CM

## 2016-05-21 DIAGNOSIS — Z5111 Encounter for antineoplastic chemotherapy: Secondary | ICD-10-CM

## 2016-05-21 DIAGNOSIS — E119 Type 2 diabetes mellitus without complications: Secondary | ICD-10-CM

## 2016-05-21 DIAGNOSIS — C799 Secondary malignant neoplasm of unspecified site: Secondary | ICD-10-CM

## 2016-05-21 LAB — COMPREHENSIVE METABOLIC PANEL
ALT: 75 U/L — AB (ref 0–55)
ANION GAP: 10 meq/L (ref 3–11)
AST: 73 U/L — ABNORMAL HIGH (ref 5–34)
Albumin: 3 g/dL — ABNORMAL LOW (ref 3.5–5.0)
Alkaline Phosphatase: 807 U/L — ABNORMAL HIGH (ref 40–150)
BUN: 15.5 mg/dL (ref 7.0–26.0)
CHLORIDE: 103 meq/L (ref 98–109)
CO2: 25 meq/L (ref 22–29)
Calcium: 9 mg/dL (ref 8.4–10.4)
Creatinine: 0.9 mg/dL (ref 0.7–1.3)
EGFR: 88 mL/min/{1.73_m2} — AB (ref >90)
Glucose: 225 mg/dl — ABNORMAL HIGH (ref 70–140)
POTASSIUM: 4.3 meq/L (ref 3.5–5.1)
Sodium: 138 mEq/L (ref 136–145)
Total Bilirubin: 0.42 mg/dL (ref 0.20–1.20)
Total Protein: 6.2 g/dL — ABNORMAL LOW (ref 6.4–8.3)

## 2016-05-21 LAB — CBC WITH DIFFERENTIAL/PLATELET
BASO%: 1.1 % (ref 0.0–2.0)
Basophils Absolute: 0.1 10*3/uL (ref 0.0–0.1)
EOS%: 5 % (ref 0.0–7.0)
Eosinophils Absolute: 0.3 10*3/uL (ref 0.0–0.5)
HCT: 31.3 % — ABNORMAL LOW (ref 38.4–49.9)
HGB: 10.2 g/dL — ABNORMAL LOW (ref 13.0–17.1)
LYMPH%: 15.2 % (ref 14.0–49.0)
MCH: 30.6 pg (ref 27.2–33.4)
MCHC: 32.7 g/dL (ref 32.0–36.0)
MCV: 93.5 fL (ref 79.3–98.0)
MONO#: 0.6 10*3/uL (ref 0.1–0.9)
MONO%: 11 % (ref 0.0–14.0)
NEUT#: 3.6 10*3/uL (ref 1.5–6.5)
NEUT%: 67.7 % (ref 39.0–75.0)
PLATELETS: 193 10*3/uL (ref 140–400)
RBC: 3.34 10*6/uL — AB (ref 4.20–5.82)
RDW: 16.5 % — ABNORMAL HIGH (ref 11.0–14.6)
WBC: 5.3 10*3/uL (ref 4.0–10.3)
lymph#: 0.8 10*3/uL — ABNORMAL LOW (ref 0.9–3.3)

## 2016-05-21 MED ORDER — SODIUM CHLORIDE 0.9 % IV SOLN
2400.0000 mg/m2 | INTRAVENOUS | Status: DC
  Administered 2016-05-21: 4550 mg via INTRAVENOUS
  Filled 2016-05-21: qty 91

## 2016-05-21 MED ORDER — PALONOSETRON HCL INJECTION 0.25 MG/5ML
0.2500 mg | Freq: Once | INTRAVENOUS | Status: AC
  Administered 2016-05-21: 0.25 mg via INTRAVENOUS

## 2016-05-21 MED ORDER — LORAZEPAM 2 MG/ML IJ SOLN
INTRAMUSCULAR | Status: AC
  Filled 2016-05-21: qty 1

## 2016-05-21 MED ORDER — LEUCOVORIN CALCIUM INJECTION 350 MG
400.0000 mg/m2 | Freq: Once | INTRAVENOUS | Status: AC
  Administered 2016-05-21: 756 mg via INTRAVENOUS
  Filled 2016-05-21: qty 37.8

## 2016-05-21 MED ORDER — ATROPINE SULFATE 1 MG/ML IJ SOLN: 0.5000 mg | Freq: Once | INTRAMUSCULAR | Status: DC | PRN

## 2016-05-21 MED ORDER — DEXTROSE 5 % IV SOLN
Freq: Once | INTRAVENOUS | Status: DC
Start: 2016-05-21 — End: 2016-05-21

## 2016-05-21 MED ORDER — SODIUM CHLORIDE 0.9 % IV SOLN
Freq: Once | INTRAVENOUS | Status: AC
  Administered 2016-05-21: 10:00:00 via INTRAVENOUS
  Filled 2016-05-21: qty 5

## 2016-05-21 MED ORDER — IRINOTECAN HCL CHEMO INJECTION 100 MG/5ML
180.0000 mg/m2 | Freq: Once | INTRAVENOUS | Status: AC
  Administered 2016-05-21: 340 mg via INTRAVENOUS
  Filled 2016-05-21: qty 15

## 2016-05-21 MED ORDER — LORAZEPAM 2 MG/ML IJ SOLN
0.5000 mg | Freq: Once | INTRAMUSCULAR | Status: AC
  Administered 2016-05-21: 0.5 mg via INTRAVENOUS

## 2016-05-21 MED ORDER — PALONOSETRON HCL INJECTION 0.25 MG/5ML
INTRAVENOUS | Status: AC
  Filled 2016-05-21: qty 5

## 2016-05-21 NOTE — Patient Instructions (Signed)

## 2016-05-21 NOTE — Progress Notes (Signed)
  [place] OFFICE PROGRESS NOTE   Diagnosis:  Pancreas cancer  INTERVAL HISTORY:   [name] returns as scheduled. He completed another cycle of FOLFIRI 05/02/2016. He had some nausea. No mouth sores. He is having bowel movements after each meal. He also notes increased gas. The stools are not consistently loose. He has constipation periodically. He denies cold sensitivity. No significant numbness or tingling in his fingertips or toes. He was more fatigued following the most recent cycle of chemotherapy.  Objective:  Vital signs in last 24 hours:  Blood pressure 135/71, pulse 64, temperature 98.1 F (36.7 C), temperature source Oral, resp. rate 18, height 6' (1.829 m), weight 150 lb 6.4 oz (68.2 kg), SpO2 100 %.    HEENT: No thrush or ulcers. Resp: Lungs clear bilaterally. Cardio: Regular rate and rhythm. GI: Abdomen soft and nontender. No hepatomegaly. No mass. Vascular: No leg edema. Calves soft and nontender. Skin: No rash. Port-A-Cath without erythema.    Lab Results:  Lab Results  Component Value Date   WBC 5.3 05/21/2016   HGB 10.2 (L) 05/21/2016   HCT 31.3 (L) 05/21/2016   MCV 93.5 05/21/2016   PLT 193 05/21/2016   NEUTROABS 3.6 05/21/2016    Imaging:  No results found.  Medications: I have reviewed the patient's current medications.  Assessment/Plan: 1. 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 Upper endoscopy 11/18/2015 confirmed duodenal invasion/obstruction with a biopsy confirming adenocarcinoma ? Placement of a duodenal stent 11/22/2015 ? MRI abdomen 11/15/2015 revealed a pancreas head mass and liver metastases ? Cycle 1 FOLFIRINOX 12/06/2015 ? Cycle 2 FOLFIRINOX 12/21/2015 ? Cycle 3 FOLFIRINOX 01/04/2016 ? CT abdomen/pelvis 01/19/2016 showed improvement in the pancreatic head mass and liver metastases. ? Cycle 4 FOLFIRINOX 02/01/2016 ? Cycle 5 FOLFIRINOX  02/15/2016 ? Cycle 6 FOLFIRINOX 02/29/2016 ? Cycle 7 FOLFIRINOX 03/14/2016 ? Cycle 8 FOLFIRINOX 03/28/2016 ? CT abdomen/pelvis 04/09/2016-improvement in pancreas head mass and liver metastases, no new metastatic disease ? Chemotherapy switch to FOLFIRI every 3 weeks beginning 04/11/2016  2. Diabetes  3. History of Anorexia/weight loss secondary to #1  4. Obstructive jaundice secondary to #1-status post placement of a percutaneous internal/external biliary drain 11/25/2015; biliary drainage catheter capped 12/02/2015; biliary drain internalized 12/13/2015  5. Port-A-Cath placement 11/30/2015 interventional radiology  6. Admission 01/17/2016 with a high fever and rigors-no apparent source for infection upon review of his history and examination, blood cultures positive for gram-positive cocci--viridans streptococcus--completed 2 weeks of IV ceftriaxone; no endocarditis on TTE.  7. Oxaliplatin neuropathy  8. Elevated transaminases-likely secondary to chemotherapy-improved   Disposition: [name] appears stable. The plan is to continue FOLFIRI every 3 weeks. He will receive treatment from today. We will follow-up on the CA-19-9 from today.   For the frequent bowel movements he will increase Creon to 2 tablets 3 times a day before meals.  He will return for a follow-up visit in 3 weeks. He will contact the office in the interim with any problems.  Plan reviewed with Dr. [name].    [name] ANP/GNP-BC   05/21/2016  8:48 AM

## 2016-05-21 NOTE — Progress Notes (Signed)
Notified Dr. [name] of changes in AST, ALT, and Alk Phos. Dr. [name] advised to proceed with treatment without changes.  During treatment, patient began c/o nausea. States this has happened in previous treatments. Notified Dr. [name] of complaint. Orders received. Medicated per orders. Patient verbalized improvement in nausea.

## 2016-05-21 NOTE — Telephone Encounter (Signed)
Gave pt cal & avs °

## 2016-05-22 LAB — CANCER ANTIGEN 19-9: CA 19-9: 97 U/mL — ABNORMAL HIGH (ref 0–35)

## 2016-05-23 ENCOUNTER — Ambulatory Visit (HOSPITAL_BASED_OUTPATIENT_CLINIC_OR_DEPARTMENT_OTHER): Payer: Medicare Other

## 2016-05-23 ENCOUNTER — Ambulatory Visit: Payer: Medicare Other

## 2016-05-23 VITALS — BP 122/70 | HR 62 | Temp 98.3°F | Resp 16

## 2016-05-23 DIAGNOSIS — Z5189 Encounter for other specified aftercare: Secondary | ICD-10-CM

## 2016-05-23 DIAGNOSIS — Z452 Encounter for adjustment and management of vascular access device: Secondary | ICD-10-CM

## 2016-05-23 DIAGNOSIS — C250 Malignant neoplasm of head of pancreas: Secondary | ICD-10-CM | POA: Diagnosis not present

## 2016-05-23 MED ORDER — SODIUM CHLORIDE 0.9% FLUSH
10.0000 mL | INTRAVENOUS | Status: DC | PRN
  Administered 2016-05-23: 10 mL
  Filled 2016-05-23: qty 10

## 2016-05-23 MED ORDER — HEPARIN SOD (PORK) LOCK FLUSH 100 UNIT/ML IV SOLN
500.0000 [IU] | Freq: Once | INTRAVENOUS | Status: AC | PRN
Start: 2016-05-23 — End: 2016-05-23
  Administered 2016-05-23: 500 [IU]
  Filled 2016-05-23: qty 5

## 2016-05-23 MED ORDER — PEGFILGRASTIM INJECTION 6 MG/0.6ML ~~LOC~~
6.0000 mg | PREFILLED_SYRINGE | Freq: Once | SUBCUTANEOUS | Status: AC
  Administered 2016-05-23: 6 mg via SUBCUTANEOUS
  Filled 2016-05-23: qty 0.6

## 2016-05-23 NOTE — Progress Notes (Signed)
Neulasta injection given in Infusion Room

## 2016-05-23 NOTE — Patient Instructions (Signed)
Pegfilgrastim injection What is this medicine? PEGFILGRASTIM (PEG fil gra stim) is a long-acting granulocyte colony-stimulating factor that stimulates the growth of neutrophils, a type of white blood cell important in the body's fight against infection. It is used to reduce the incidence of fever and infection in patients with certain types of cancer who are receiving chemotherapy that affects the bone marrow, and to increase survival after being exposed to high doses of radiation. This medicine may be used for other purposes; ask your health care provider or pharmacist if you have questions. What should I tell my health care provider before I take this medicine? They need to know if you have any of these conditions: -kidney disease -latex allergy -ongoing radiation therapy -sickle cell disease -skin reactions to acrylic adhesives (On-Body Injector only) -an unusual or allergic reaction to pegfilgrastim, filgrastim, other medicines, foods, dyes, or preservatives -pregnant or trying to get pregnant -breast-feeding How should I use this medicine? This medicine is for injection under the skin. If you get this medicine at home, you will be taught how to prepare and give the pre-filled syringe or how to use the On-body Injector. Refer to the patient Instructions for Use for detailed instructions. Use exactly as directed. Take your medicine at regular intervals. Do not take your medicine more often than directed. It is important that you put your used needles and syringes in a special sharps container. Do not put them in a trash can. If you do not have a sharps container, call your pharmacist or healthcare provider to get one. Talk to your pediatrician regarding the use of this medicine in children. While this drug may be prescribed for selected conditions, precautions do apply. Overdosage: If you think you have taken too much of this medicine contact a poison control center or emergency room at  once. NOTE: This medicine is only for you. Do not share this medicine with others. What if I miss a dose? It is important not to miss your dose. Call your doctor or health care professional if you miss your dose. If you miss a dose due to an On-body Injector failure or leakage, a new dose should be administered as soon as possible using a single prefilled syringe for manual use. What may interact with this medicine? Interactions have not been studied. Give your health care provider a list of all the medicines, herbs, non-prescription drugs, or dietary supplements you use. Also tell them if you smoke, drink alcohol, or use illegal drugs. Some items may interact with your medicine. This list may not describe all possible interactions. Give your health care provider a list of all the medicines, herbs, non-prescription drugs, or dietary supplements you use. Also tell them if you smoke, drink alcohol, or use illegal drugs. Some items may interact with your medicine. What should I watch for while using this medicine? You may need blood work done while you are taking this medicine. If you are going to need a MRI, CT scan, or other procedure, tell your doctor that you are using this medicine (On-Body Injector only). What side effects may I notice from receiving this medicine? Side effects that you should report to your doctor or health care professional as soon as possible: -allergic reactions like skin rash, itching or hives, swelling of the face, lips, or tongue -dizziness -fever -pain, redness, or irritation at site where injected -pinpoint red spots on the skin -red or dark-brown urine -shortness of breath or breathing problems -stomach or side pain, or pain   at the shoulder -swelling -tiredness -trouble passing urine or change in the amount of urine Side effects that usually do not require medical attention (report to your doctor or health care professional if they continue or are  bothersome): -bone pain -muscle pain This list may not describe all possible side effects. Call your doctor for medical advice about side effects. You may report side effects to FDA at 1-800-FDA-1088. Where should I keep my medicine? Keep out of the reach of children. Store pre-filled syringes in a refrigerator between 2 and 8 degrees C (36 and 46 degrees F). Do not freeze. Keep in carton to protect from light. Throw away this medicine if it is left out of the refrigerator for more than 48 hours. Throw away any unused medicine after the expiration date. NOTE: This sheet is a summary. It may not cover all possible information. If you have questions about this medicine, talk to your doctor, pharmacist, or health care provider.    2016, Elsevier/Gold Standard. (2014-10-28 14:30:14) Implanted Port Home Guide An implanted port is a type of central line that is placed under the skin. Central lines are used to provide IV access when treatment or nutrition needs to be given through a person's veins. Implanted ports are used for long-term IV access. An implanted port may be placed because:   You need IV medicine that would be irritating to the small veins in your hands or arms.   You need long-term IV medicines, such as antibiotics.   You need IV nutrition for a long period.   You need frequent blood draws for lab tests.   You need dialysis.  Implanted ports are usually placed in the chest area, but they can also be placed in the upper arm, the abdomen, or the leg. An implanted port has two main parts:   Reservoir. The reservoir is round and will appear as a small, raised area under your skin. The reservoir is the part where a needle is inserted to give medicines or draw blood.   Catheter. The catheter is a thin, flexible tube that extends from the reservoir. The catheter is placed into a large vein. Medicine that is inserted into the reservoir goes into the catheter and then into the vein.   HOW WILL I CARE FOR MY INCISION SITE? Do not get the incision site wet. Bathe or shower as directed by your health care provider.  HOW IS MY PORT ACCESSED? Special steps must be taken to access the port:   Before the port is accessed, a numbing cream can be placed on the skin. This helps numb the skin over the port site.   Your health care provider uses a sterile technique to access the port.  Your health care provider must put on a mask and sterile gloves.  The skin over your port is cleaned carefully with an antiseptic and allowed to dry.  The port is gently pinched between sterile gloves, and a needle is inserted into the port.  Only "non-coring" port needles should be used to access the port. Once the port is accessed, a blood return should be checked. This helps ensure that the port is in the vein and is not clogged.   If your port needs to remain accessed for a constant infusion, a clear (transparent) bandage will be placed over the needle site. The bandage and needle will need to be changed every week, or as directed by your health care provider.   Keep the bandage covering the   needle clean and dry. Do not get it wet. Follow your health care provider's instructions on how to take a shower or bath while the port is accessed.   If your port does not need to stay accessed, no bandage is needed over the port.  WHAT IS FLUSHING? Flushing helps keep the port from getting clogged. Follow your health care provider's instructions on how and when to flush the port. Ports are usually flushed with saline solution or a medicine called heparin. The need for flushing will depend on how the port is used.   If the port is used for intermittent medicines or blood draws, the port will need to be flushed:   After medicines have been given.   After blood has been drawn.   As part of routine maintenance.   If a constant infusion is running, the port may not need to be flushed.  HOW  LONG WILL MY PORT STAY IMPLANTED? The port can stay in for as long as your health care provider thinks it is needed. When it is time for the port to come out, surgery will be done to remove it. The procedure is similar to the one performed when the port was put in.  WHEN SHOULD I SEEK IMMEDIATE MEDICAL CARE? When you have an implanted port, you should seek immediate medical care if:   You notice a bad smell coming from the incision site.   You have swelling, redness, or drainage at the incision site.   You have more swelling or pain at the port site or the surrounding area.   You have a fever that is not controlled with medicine.   This information is not intended to replace advice given to you by your health care provider. Make sure you discuss any questions you have with your health care provider.   Document Released: 10/08/2005 Document Revised: 07/29/2013 Document Reviewed: 06/15/2013 Elsevier Interactive Patient Education 2016 Elsevier Inc.  

## 2016-05-25 ENCOUNTER — Telehealth: Payer: Self-pay | Admitting: *Deleted

## 2016-05-25 NOTE — Telephone Encounter (Signed)
Left message on voicemail for pt to call office. Lab looks better, per Dr. [name].

## 2016-05-25 NOTE — Telephone Encounter (Signed)
-----   Message from Ladell Pier, MD sent at 05/25/2016  4:28 PM EDT ----- Please call patient ca19-9 is better

## 2016-06-10 ENCOUNTER — Other Ambulatory Visit: Payer: Self-pay | Admitting: Oncology

## 2016-06-13 ENCOUNTER — Ambulatory Visit: Payer: Medicare Other

## 2016-06-13 ENCOUNTER — Telehealth: Payer: Self-pay | Admitting: *Deleted

## 2016-06-13 ENCOUNTER — Telehealth: Payer: Self-pay | Admitting: Oncology

## 2016-06-13 ENCOUNTER — Other Ambulatory Visit (HOSPITAL_BASED_OUTPATIENT_CLINIC_OR_DEPARTMENT_OTHER): Payer: Medicare Other

## 2016-06-13 ENCOUNTER — Ambulatory Visit (HOSPITAL_BASED_OUTPATIENT_CLINIC_OR_DEPARTMENT_OTHER): Payer: Medicare Other | Admitting: Oncology

## 2016-06-13 VITALS — BP 115/69 | HR 64 | Temp 98.5°F | Resp 17 | Ht 72.0 in | Wt 152.3 lb

## 2016-06-13 DIAGNOSIS — R740 Nonspecific elevation of levels of transaminase and lactic acid dehydrogenase [LDH]: Secondary | ICD-10-CM

## 2016-06-13 DIAGNOSIS — Z95828 Presence of other vascular implants and grafts: Secondary | ICD-10-CM

## 2016-06-13 DIAGNOSIS — E119 Type 2 diabetes mellitus without complications: Secondary | ICD-10-CM | POA: Diagnosis not present

## 2016-06-13 DIAGNOSIS — C250 Malignant neoplasm of head of pancreas: Secondary | ICD-10-CM

## 2016-06-13 DIAGNOSIS — C787 Secondary malignant neoplasm of liver and intrahepatic bile duct: Secondary | ICD-10-CM

## 2016-06-13 DIAGNOSIS — Z452 Encounter for adjustment and management of vascular access device: Secondary | ICD-10-CM | POA: Diagnosis not present

## 2016-06-13 LAB — CBC WITH DIFFERENTIAL/PLATELET
BASO%: 0.4 % (ref 0.0–2.0)
Basophils Absolute: 0 10*3/uL (ref 0.0–0.1)
EOS ABS: 0.1 10*3/uL (ref 0.0–0.5)
EOS%: 1.4 % (ref 0.0–7.0)
HEMATOCRIT: 30.5 % — AB (ref 38.4–49.9)
HEMOGLOBIN: 9.9 g/dL — AB (ref 13.0–17.1)
LYMPH%: 9.3 % — AB (ref 14.0–49.0)
MCH: 30.6 pg (ref 27.2–33.4)
MCHC: 32.5 g/dL (ref 32.0–36.0)
MCV: 94.1 fL (ref 79.3–98.0)
MONO#: 1.2 10*3/uL — AB (ref 0.1–0.9)
MONO%: 12.8 % (ref 0.0–14.0)
NEUT%: 76.1 % — ABNORMAL HIGH (ref 39.0–75.0)
NEUTROS ABS: 7.3 10*3/uL — AB (ref 1.5–6.5)
PLATELETS: 150 10*3/uL (ref 140–400)
RBC: 3.24 10*6/uL — AB (ref 4.20–5.82)
RDW: 15.5 % — ABNORMAL HIGH (ref 11.0–14.6)
WBC: 9.6 10*3/uL (ref 4.0–10.3)
lymph#: 0.9 10*3/uL (ref 0.9–3.3)

## 2016-06-13 LAB — COMPREHENSIVE METABOLIC PANEL
ALBUMIN: 3.1 g/dL — AB (ref 3.5–5.0)
ALK PHOS: 831 U/L — AB (ref 40–150)
ALT: 105 U/L — ABNORMAL HIGH (ref 0–55)
ANION GAP: 8 meq/L (ref 3–11)
AST: 106 U/L — ABNORMAL HIGH (ref 5–34)
BILIRUBIN TOTAL: 1.33 mg/dL — AB (ref 0.20–1.20)
BUN: 14.3 mg/dL (ref 7.0–26.0)
CALCIUM: 9.3 mg/dL (ref 8.4–10.4)
CO2: 26 mEq/L (ref 22–29)
Chloride: 101 mEq/L (ref 98–109)
Creatinine: 0.9 mg/dL (ref 0.7–1.3)
EGFR: 83 mL/min/{1.73_m2} — AB (ref >90)
Glucose: 257 mg/dl — ABNORMAL HIGH (ref 70–140)
Potassium: 4.2 mEq/L (ref 3.5–5.1)
Sodium: 136 mEq/L (ref 136–145)
TOTAL PROTEIN: 6.4 g/dL (ref 6.4–8.3)

## 2016-06-13 MED ORDER — SODIUM CHLORIDE 0.9 % IJ SOLN
10.0000 mL | INTRAMUSCULAR | Status: DC | PRN
  Administered 2016-06-13: 10 mL via INTRAVENOUS
  Filled 2016-06-13: qty 10

## 2016-06-13 MED ORDER — PANCRELIPASE (LIP-PROT-AMYL) 12000-38000 UNITS PO CPEP: ORAL_CAPSULE | ORAL | 2 refills | Status: DC

## 2016-06-13 MED ORDER — HEPARIN SOD (PORK) LOCK FLUSH 100 UNIT/ML IV SOLN
500.0000 [IU] | Freq: Once | INTRAVENOUS | Status: AC | PRN
  Administered 2016-06-13: 500 [IU] via INTRAVENOUS
  Filled 2016-06-13: qty 5

## 2016-06-13 NOTE — Progress Notes (Signed)
  [place] OFFICE PROGRESS NOTE   Diagnosis: Pancreas cancer  INTERVAL HISTORY:   [name] returns as scheduled. He completed another cycle of FOLFIRI 05/21/2016. He tolerated chemotherapy well. No nausea/vomiting, mouth sores, or diarrhea. He recently returned from a cruise to [place]. He felt well on the cruise except for 2 days. He is working at home. He had an episode of chills last night. He relates this to low blood sugar. No fever.    Objective:  Vital signs in last 24 hours:  Blood pressure 115/69, pulse 64, temperature 98.5 F (36.9 C), temperature source Oral, resp. rate 17, height 6' (1.829 m), weight 152 lb 4.8 oz (69.1 kg), SpO2 100 %.   HEENT: No thrush or ulcers Lungs clear bilaterally Cardio: Regular rate and rhythm GI: No hepatomegaly, nontender Skin:Palms without erythema  Lab Results:  Lab Results  Component Value Date   WBC 9.6 06/13/2016   HGB 9.9 (L) 06/13/2016   HCT 30.5 (L) 06/13/2016   MCV 94.1 06/13/2016   PLT 150 06/13/2016   NEUTROABS 7.3 (H) 06/13/2016   Alkaline phosphatase 831, AST 106, ALT 105, bilirubin 1.33  CA 19-9 on 731 2017-97    Medications: I have reviewed the patient's current medications.  Assessment/Plan: 1. 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 Upper endoscopy 11/18/2015 confirmed duodenal invasion/obstruction with a biopsy confirming adenocarcinoma ? Placement of a duodenal stent 11/22/2015 ? MRI abdomen 11/15/2015 revealed a pancreas head mass and liver metastases ? Cycle 1 FOLFIRINOX 12/06/2015 ? Cycle 2 FOLFIRINOX 12/21/2015 ? Cycle 3 FOLFIRINOX 01/04/2016 ? CT abdomen/pelvis 01/19/2016 showed improvement in the pancreatic head mass and liver metastases. ? Cycle 4 FOLFIRINOX 02/01/2016 ? Cycle 5 FOLFIRINOX 02/15/2016 ? Cycle 6 FOLFIRINOX 02/29/2016 ? Cycle 7 FOLFIRINOX 03/14/2016 ? Cycle 8 FOLFIRINOX 03/28/2016 ? CT  abdomen/pelvis 04/09/2016-improvement in pancreas head mass and liver metastases, no new metastatic disease ? Chemotherapy switch to FOLFIRI every 3 weeks beginning 04/11/2016  2. Diabetes  3. History of Anorexia/weight loss secondary to #1  4. Obstructive jaundice secondary to #1-status post placement of a percutaneous internal/external biliary drain 11/25/2015; biliary drainage catheter capped 12/02/2015; biliary drain internalized 12/13/2015  5. Port-A-Cath placement 11/30/2015 interventional radiology  6. Admission 01/17/2016 with a high fever and rigors-no apparent source for infection upon review of his history and examination, blood cultures positive for gram-positive cocci--viridans streptococcus--completed 2 weeks of IV ceftriaxone; no endocarditis on TTE.  7. Oxaliplatin neuropathy  8. Elevated transaminases, alkaline phosphatase, and bilirubin     Disposition Mr. Niven appears well. No clinical evidence of disease progression. The liver enzymes and bilirubin are higher today. This may be related to toxicity from chemotherapy versus stent obstruction. He will contact us for a fever or recurrent chills. We decided to hold chemotherapy today. He will return for an office visit and the next cycle of FOLFIRI on 06/19/2016.     Betsy Coder, MD  06/13/2016  11:01 AM

## 2016-06-13 NOTE — Telephone Encounter (Signed)
APPT SCHD AND AVS GIVEN PER 06/13/16 LOS.

## 2016-06-13 NOTE — Patient Instructions (Signed)

## 2016-06-13 NOTE — Telephone Encounter (Signed)
Per Staff message and LOS I have scheduled 8/29 appt. Message sent to shceduler

## 2016-06-13 NOTE — Addendum Note (Signed)
Addended by: Brien Few on: 06/13/2016 11:48 AM   Modules accepted: Orders

## 2016-06-14 LAB — CANCER ANTIGEN 19-9: CA 19-9: 116 U/mL — ABNORMAL HIGH (ref 0–35)

## 2016-06-15 ENCOUNTER — Ambulatory Visit: Payer: Medicare Other

## 2016-06-19 ENCOUNTER — Ambulatory Visit (HOSPITAL_BASED_OUTPATIENT_CLINIC_OR_DEPARTMENT_OTHER): Payer: Medicare Other

## 2016-06-19 ENCOUNTER — Encounter: Payer: Self-pay | Admitting: *Deleted

## 2016-06-19 ENCOUNTER — Telehealth: Payer: Self-pay | Admitting: Oncology

## 2016-06-19 ENCOUNTER — Other Ambulatory Visit (HOSPITAL_BASED_OUTPATIENT_CLINIC_OR_DEPARTMENT_OTHER): Payer: Medicare Other

## 2016-06-19 ENCOUNTER — Ambulatory Visit (HOSPITAL_BASED_OUTPATIENT_CLINIC_OR_DEPARTMENT_OTHER): Payer: Medicare Other | Admitting: Nurse Practitioner

## 2016-06-19 VITALS — BP 144/66 | HR 72 | Temp 98.7°F | Resp 18 | Ht 72.0 in | Wt 155.4 lb

## 2016-06-19 DIAGNOSIS — G620 Drug-induced polyneuropathy: Secondary | ICD-10-CM

## 2016-06-19 DIAGNOSIS — C250 Malignant neoplasm of head of pancreas: Secondary | ICD-10-CM

## 2016-06-19 DIAGNOSIS — Z452 Encounter for adjustment and management of vascular access device: Secondary | ICD-10-CM

## 2016-06-19 DIAGNOSIS — E119 Type 2 diabetes mellitus without complications: Secondary | ICD-10-CM | POA: Diagnosis not present

## 2016-06-19 DIAGNOSIS — Z95828 Presence of other vascular implants and grafts: Secondary | ICD-10-CM

## 2016-06-19 DIAGNOSIS — C787 Secondary malignant neoplasm of liver and intrahepatic bile duct: Secondary | ICD-10-CM

## 2016-06-19 DIAGNOSIS — C799 Secondary malignant neoplasm of unspecified site: Secondary | ICD-10-CM

## 2016-06-19 DIAGNOSIS — Z5111 Encounter for antineoplastic chemotherapy: Secondary | ICD-10-CM | POA: Diagnosis not present

## 2016-06-19 DIAGNOSIS — R740 Nonspecific elevation of levels of transaminase and lactic acid dehydrogenase [LDH]: Secondary | ICD-10-CM

## 2016-06-19 LAB — COMPREHENSIVE METABOLIC PANEL
ALBUMIN: 3 g/dL — AB (ref 3.5–5.0)
ALK PHOS: 898 U/L — AB (ref 40–150)
ALT: 86 U/L — AB (ref 0–55)
ANION GAP: 12 meq/L — AB (ref 3–11)
AST: 88 U/L — AB (ref 5–34)
BUN: 15.8 mg/dL (ref 7.0–26.0)
CALCIUM: 8.9 mg/dL (ref 8.4–10.4)
CHLORIDE: 103 meq/L (ref 98–109)
CO2: 22 mEq/L (ref 22–29)
CREATININE: 0.9 mg/dL (ref 0.7–1.3)
EGFR: 89 mL/min/{1.73_m2} — ABNORMAL LOW (ref >90)
Glucose: 279 mg/dl — ABNORMAL HIGH (ref 70–140)
POTASSIUM: 3.6 meq/L (ref 3.5–5.1)
Sodium: 137 mEq/L (ref 136–145)
Total Bilirubin: 0.85 mg/dL (ref 0.20–1.20)
Total Protein: 6.4 g/dL (ref 6.4–8.3)

## 2016-06-19 LAB — CBC WITH DIFFERENTIAL/PLATELET
BASO%: 0.4 % (ref 0.0–2.0)
BASOS ABS: 0 10*3/uL (ref 0.0–0.1)
EOS ABS: 0.2 10*3/uL (ref 0.0–0.5)
EOS%: 1.8 % (ref 0.0–7.0)
HEMATOCRIT: 31.4 % — AB (ref 38.4–49.9)
HEMOGLOBIN: 10.2 g/dL — AB (ref 13.0–17.1)
LYMPH#: 0.6 10*3/uL — AB (ref 0.9–3.3)
LYMPH%: 6.9 % — ABNORMAL LOW (ref 14.0–49.0)
MCH: 30.2 pg (ref 27.2–33.4)
MCHC: 32.4 g/dL (ref 32.0–36.0)
MCV: 93.2 fL (ref 79.3–98.0)
MONO#: 0.8 10*3/uL (ref 0.1–0.9)
MONO%: 8.8 % (ref 0.0–14.0)
NEUT%: 82.1 % — ABNORMAL HIGH (ref 39.0–75.0)
NEUTROS ABS: 7 10*3/uL — AB (ref 1.5–6.5)
Platelets: 183 10*3/uL (ref 140–400)
RBC: 3.37 10*6/uL — ABNORMAL LOW (ref 4.20–5.82)
RDW: 15.9 % — AB (ref 11.0–14.6)
WBC: 8.6 10*3/uL (ref 4.0–10.3)

## 2016-06-19 MED ORDER — PALONOSETRON HCL INJECTION 0.25 MG/5ML
0.2500 mg | Freq: Once | INTRAVENOUS | Status: AC
  Administered 2016-06-19: 0.25 mg via INTRAVENOUS

## 2016-06-19 MED ORDER — PALONOSETRON HCL INJECTION 0.25 MG/5ML
INTRAVENOUS | Status: AC
  Filled 2016-06-19: qty 5

## 2016-06-19 MED ORDER — HEPARIN SOD (PORK) LOCK FLUSH 100 UNIT/ML IV SOLN
500.0000 [IU] | Freq: Once | INTRAVENOUS | Status: DC | PRN
  Filled 2016-06-19: qty 5

## 2016-06-19 MED ORDER — SODIUM CHLORIDE 0.9 % IV SOLN
180.0000 mg/m2 | Freq: Once | INTRAVENOUS | Status: AC
  Administered 2016-06-19: 340 mg via INTRAVENOUS
  Filled 2016-06-19: qty 15

## 2016-06-19 MED ORDER — SODIUM CHLORIDE 0.9 % IV SOLN
Freq: Once | INTRAVENOUS | Status: AC
  Administered 2016-06-19: 11:00:00 via INTRAVENOUS
  Filled 2016-06-19: qty 5

## 2016-06-19 MED ORDER — SODIUM CHLORIDE 0.9 % IV SOLN
2400.0000 mg/m2 | INTRAVENOUS | Status: DC
  Administered 2016-06-19: 4550 mg via INTRAVENOUS
  Filled 2016-06-19: qty 91

## 2016-06-19 MED ORDER — LEUCOVORIN CALCIUM INJECTION 350 MG: 400.0000 mg/m2 | Freq: Once | INTRAVENOUS | Status: DC

## 2016-06-19 MED ORDER — SODIUM CHLORIDE 0.9 % IJ SOLN
10.0000 mL | INTRAMUSCULAR | Status: DC | PRN
  Administered 2016-06-19: 10 mL via INTRAVENOUS
  Filled 2016-06-19: qty 10

## 2016-06-19 MED ORDER — SODIUM CHLORIDE 0.9 % IV SOLN
400.0000 mg/m2 | Freq: Once | INTRAVENOUS | Status: AC
  Administered 2016-06-19: 756 mg via INTRAVENOUS
  Filled 2016-06-19: qty 37.8

## 2016-06-19 MED ORDER — SODIUM CHLORIDE 0.9% FLUSH
10.0000 mL | INTRAVENOUS | Status: DC | PRN
  Filled 2016-06-19: qty 10

## 2016-06-19 NOTE — Patient Instructions (Signed)
[facility] Discharge Instructions for Patients Receiving Chemotherapy  Today you received the following chemotherapy agents Leucovorin, Irinotecan and Adrucil  To help prevent nausea and vomiting after your treatment, we encourage you to take your nausea medication as directed. No Zofran for 3 days. Take Compazine instead.   If you develop nausea and vomiting that is not controlled by your nausea medication, call the clinic.   BELOW ARE SYMPTOMS THAT SHOULD BE REPORTED IMMEDIATELY:  *FEVER GREATER THAN 100.5 F  *CHILLS WITH OR WITHOUT FEVER  NAUSEA AND VOMITING THAT IS NOT CONTROLLED WITH YOUR NAUSEA MEDICATION  *UNUSUAL SHORTNESS OF BREATH  *UNUSUAL BRUISING OR BLEEDING  TENDERNESS IN MOUTH AND THROAT WITH OR WITHOUT PRESENCE OF ULCERS  *URINARY PROBLEMS  *BOWEL PROBLEMS  UNUSUAL RASH Items with * indicate a potential emergency and should be followed up as soon as possible.  Feel free to call the clinic you have any questions or concerns. The clinic phone number is [phone].  Please show the [place] at check-in to the Emergency Department and triage nurse.

## 2016-06-19 NOTE — Progress Notes (Signed)
  Jacob Hardin OFFICE PROGRESS NOTE   Diagnosis:  Pancreas cancer  INTERVAL HISTORY:   Jacob Hardin returns as scheduled. Chemotherapy was held last week due to elevated liver enzymes and bilirubin. He feels well. No further chills. No fever. No abdominal pain. He denies diarrhea. No nausea or vomiting. He has a good appetite. No mouth sores.  Objective:  Vital signs in last 24 hours:  Blood pressure (!) 144/66, pulse 72, temperature 98.7 F (37.1 C), temperature source Oral, resp. rate 18, height 6' (1.829 m), weight 155 lb 6.4 oz (70.5 kg), SpO2 100 %.    HEENT: No thrush or ulcers. Resp: Lungs clear bilaterally. Cardio: Regular rate and rhythm. GI: Abdomen soft and nontender. No hepatomegaly. No mass. Vascular: No leg edema. Calves soft and nontender. Skin: Palms without erythema. Port-A-Cath without erythema.    Lab Results:  Lab Results  Component Value Date   WBC 8.6 06/19/2016   HGB 10.2 (L) 06/19/2016   HCT 31.4 (L) 06/19/2016   MCV 93.2 06/19/2016   PLT 183 06/19/2016   NEUTROABS 7.0 (H) 06/19/2016    Imaging:  No results found.  Medications: I have reviewed the patient's current medications.  Assessment/Plan: 1. 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 Upper endoscopy 11/18/2015 confirmed duodenal invasion/obstruction with a biopsy confirming adenocarcinoma ? Placement of a duodenal stent 11/22/2015 ? MRI abdomen 11/15/2015 revealed a pancreas head mass and liver metastases ? Cycle 1 FOLFIRINOX 12/06/2015 ? Cycle 2 FOLFIRINOX 12/21/2015 ? Cycle 3 FOLFIRINOX 01/04/2016 ? CT abdomen/pelvis 01/19/2016 showed improvement in the pancreatic head mass and liver metastases. ? Cycle 4 FOLFIRINOX 02/01/2016 ? Cycle 5 FOLFIRINOX 02/15/2016 ? Cycle 6 FOLFIRINOX 02/29/2016 ? Cycle 7 FOLFIRINOX 03/14/2016 ? Cycle 8 FOLFIRINOX 03/28/2016 ? CT abdomen/pelvis 04/09/2016-improvement  in pancreas head mass and liver metastases, no new metastatic disease ? Chemotherapy switch to FOLFIRI every 3 weeks beginning 04/11/2016  2. Diabetes  3. History of Anorexia/weight loss secondary to #1  4. Obstructive jaundice secondary to #1-status post placement of a percutaneous internal/external biliary drain 11/25/2015; biliary drainage catheter capped 12/02/2015; biliary drain internalized 12/13/2015  5. Port-A-Cath placement 11/30/2015 interventional radiology  6. Admission 01/17/2016 with a high fever and rigors-no apparent source for infection upon review of his history and examination, blood cultures positive for gram-positive cocci--viridans streptococcus--completed 2 weeks of IV ceftriaxone; no endocarditis on TTE.  7. Oxaliplatin neuropathy  8. Elevated transaminases, alkaline phosphatase, and bilirubin 06/13/2016. 06/19/2016 bilirubin improved to normal range; liver enzymes remain elevated.   Disposition: Jacob Hardin appears well. FOLFIRI was held last week due to elevated liver enzymes and bilirubin. I reviewed today's labs with Dr. [name] and the [facility] pharmacist. We decided to proceed with treatment today as scheduled. He will return for a follow-up visit and the next cycle of FOLFIRI in 3 weeks. He will contact the office in the interim with any problems.  25 minutes were spent face-to-face at today's visit with the majority of that time involved in counseling/coordination of care.    [name] ANP/GNP-BC   06/19/2016  8:56 AM

## 2016-06-19 NOTE — Telephone Encounter (Signed)
GAVE PATIENT WIFE AVS REPORT AND APPOINTMENTS FOR AUGUST THRU October.

## 2016-06-20 LAB — CANCER ANTIGEN 19-9: CA 19-9: 128 U/mL — ABNORMAL HIGH (ref 0–35)

## 2016-06-21 ENCOUNTER — Ambulatory Visit (HOSPITAL_BASED_OUTPATIENT_CLINIC_OR_DEPARTMENT_OTHER): Payer: Medicare Other

## 2016-06-21 VITALS — BP 139/73 | HR 61 | Temp 98.4°F | Resp 16

## 2016-06-21 DIAGNOSIS — Z5189 Encounter for other specified aftercare: Secondary | ICD-10-CM | POA: Diagnosis not present

## 2016-06-21 DIAGNOSIS — C250 Malignant neoplasm of head of pancreas: Secondary | ICD-10-CM | POA: Diagnosis not present

## 2016-06-21 DIAGNOSIS — Z452 Encounter for adjustment and management of vascular access device: Secondary | ICD-10-CM

## 2016-06-21 MED ORDER — HEPARIN SOD (PORK) LOCK FLUSH 100 UNIT/ML IV SOLN
500.0000 [IU] | Freq: Once | INTRAVENOUS | Status: AC | PRN
  Administered 2016-06-21: 500 [IU]
  Filled 2016-06-21: qty 5

## 2016-06-21 MED ORDER — SODIUM CHLORIDE 0.9% FLUSH
10.0000 mL | INTRAVENOUS | Status: DC | PRN
  Administered 2016-06-21: 10 mL
  Filled 2016-06-21: qty 10

## 2016-06-21 MED ORDER — PEGFILGRASTIM INJECTION 6 MG/0.6ML ~~LOC~~
6.0000 mg | PREFILLED_SYRINGE | Freq: Once | SUBCUTANEOUS | Status: AC
  Administered 2016-06-21: 6 mg via SUBCUTANEOUS
  Filled 2016-06-21: qty 0.6

## 2016-06-21 NOTE — Progress Notes (Signed)
Neulasta injection was given in Infusion Room

## 2016-07-04 ENCOUNTER — Ambulatory Visit: Payer: Medicare Other

## 2016-07-04 ENCOUNTER — Ambulatory Visit: Payer: Medicare Other | Admitting: Nurse Practitioner

## 2016-07-04 ENCOUNTER — Other Ambulatory Visit: Payer: Medicare Other

## 2016-07-06 ENCOUNTER — Ambulatory Visit: Payer: Medicare Other

## 2016-07-11 ENCOUNTER — Ambulatory Visit (HOSPITAL_BASED_OUTPATIENT_CLINIC_OR_DEPARTMENT_OTHER): Payer: Medicare Other | Admitting: Nurse Practitioner

## 2016-07-11 ENCOUNTER — Telehealth: Payer: Self-pay | Admitting: Oncology

## 2016-07-11 ENCOUNTER — Other Ambulatory Visit (HOSPITAL_BASED_OUTPATIENT_CLINIC_OR_DEPARTMENT_OTHER): Payer: Medicare Other

## 2016-07-11 ENCOUNTER — Ambulatory Visit (HOSPITAL_BASED_OUTPATIENT_CLINIC_OR_DEPARTMENT_OTHER): Payer: Medicare Other

## 2016-07-11 VITALS — BP 162/76 | HR 61 | Temp 98.4°F | Resp 18 | Ht 72.0 in | Wt 159.5 lb

## 2016-07-11 VITALS — BP 167/88

## 2016-07-11 DIAGNOSIS — C799 Secondary malignant neoplasm of unspecified site: Secondary | ICD-10-CM

## 2016-07-11 DIAGNOSIS — Z95828 Presence of other vascular implants and grafts: Secondary | ICD-10-CM

## 2016-07-11 DIAGNOSIS — R945 Abnormal results of liver function studies: Secondary | ICD-10-CM

## 2016-07-11 DIAGNOSIS — E119 Type 2 diabetes mellitus without complications: Secondary | ICD-10-CM | POA: Diagnosis not present

## 2016-07-11 DIAGNOSIS — C250 Malignant neoplasm of head of pancreas: Secondary | ICD-10-CM

## 2016-07-11 DIAGNOSIS — Z5111 Encounter for antineoplastic chemotherapy: Secondary | ICD-10-CM

## 2016-07-11 DIAGNOSIS — Z23 Encounter for immunization: Secondary | ICD-10-CM

## 2016-07-11 DIAGNOSIS — Z452 Encounter for adjustment and management of vascular access device: Secondary | ICD-10-CM | POA: Diagnosis not present

## 2016-07-11 DIAGNOSIS — C787 Secondary malignant neoplasm of liver and intrahepatic bile duct: Secondary | ICD-10-CM | POA: Diagnosis not present

## 2016-07-11 DIAGNOSIS — G620 Drug-induced polyneuropathy: Secondary | ICD-10-CM | POA: Diagnosis not present

## 2016-07-11 DIAGNOSIS — R740 Nonspecific elevation of levels of transaminase and lactic acid dehydrogenase [LDH]: Secondary | ICD-10-CM

## 2016-07-11 LAB — COMPREHENSIVE METABOLIC PANEL
ALT: 94 U/L — ABNORMAL HIGH (ref 0–55)
ANION GAP: 9 meq/L (ref 3–11)
AST: 104 U/L — ABNORMAL HIGH (ref 5–34)
Albumin: 3.1 g/dL — ABNORMAL LOW (ref 3.5–5.0)
Alkaline Phosphatase: 953 U/L — ABNORMAL HIGH (ref 40–150)
BUN: 11.7 mg/dL (ref 7.0–26.0)
CHLORIDE: 105 meq/L (ref 98–109)
CO2: 25 meq/L (ref 22–29)
Calcium: 8.8 mg/dL (ref 8.4–10.4)
Creatinine: 0.9 mg/dL (ref 0.7–1.3)
EGFR: 88 mL/min/{1.73_m2} — AB (ref >90)
GLUCOSE: 215 mg/dL — AB (ref 70–140)
Potassium: 3.6 mEq/L (ref 3.5–5.1)
SODIUM: 138 meq/L (ref 136–145)
TOTAL PROTEIN: 6.3 g/dL — AB (ref 6.4–8.3)
Total Bilirubin: 0.66 mg/dL (ref 0.20–1.20)

## 2016-07-11 LAB — CBC WITH DIFFERENTIAL/PLATELET
BASO%: 0.5 % (ref 0.0–2.0)
Basophils Absolute: 0 10*3/uL (ref 0.0–0.1)
EOS ABS: 0.1 10*3/uL (ref 0.0–0.5)
EOS%: 2 % (ref 0.0–7.0)
HCT: 30.5 % — ABNORMAL LOW (ref 38.4–49.9)
HGB: 10 g/dL — ABNORMAL LOW (ref 13.0–17.1)
LYMPH%: 12.9 % — AB (ref 14.0–49.0)
MCH: 30.7 pg (ref 27.2–33.4)
MCHC: 32.8 g/dL (ref 32.0–36.0)
MCV: 93.6 fL (ref 79.3–98.0)
MONO#: 0.6 10*3/uL (ref 0.1–0.9)
MONO%: 10 % (ref 0.0–14.0)
NEUT%: 74.6 % (ref 39.0–75.0)
NEUTROS ABS: 4.4 10*3/uL (ref 1.5–6.5)
PLATELETS: 153 10*3/uL (ref 140–400)
RBC: 3.26 10*6/uL — AB (ref 4.20–5.82)
RDW: 15.9 % — ABNORMAL HIGH (ref 11.0–14.6)
WBC: 5.9 10*3/uL (ref 4.0–10.3)
lymph#: 0.8 10*3/uL — ABNORMAL LOW (ref 0.9–3.3)

## 2016-07-11 MED ORDER — SODIUM CHLORIDE 0.9 % IJ SOLN
10.0000 mL | INTRAMUSCULAR | Status: DC | PRN
  Administered 2016-07-11: 10 mL via INTRAVENOUS
  Filled 2016-07-11: qty 10

## 2016-07-11 MED ORDER — SODIUM CHLORIDE 0.9 % IV SOLN
180.0000 mg/m2 | Freq: Once | INTRAVENOUS | Status: AC
  Administered 2016-07-11: 340 mg via INTRAVENOUS
  Filled 2016-07-11: qty 15

## 2016-07-11 MED ORDER — PALONOSETRON HCL INJECTION 0.25 MG/5ML
0.2500 mg | Freq: Once | INTRAVENOUS | Status: AC
  Administered 2016-07-11: 0.25 mg via INTRAVENOUS

## 2016-07-11 MED ORDER — ATROPINE SULFATE 1 MG/ML IJ SOLN: 0.5000 mg | Freq: Once | INTRAMUSCULAR | Status: DC | PRN

## 2016-07-11 MED ORDER — SODIUM CHLORIDE 0.9 % IV SOLN
Freq: Once | INTRAVENOUS | Status: AC
  Administered 2016-07-11: 11:00:00 via INTRAVENOUS
  Filled 2016-07-11: qty 5

## 2016-07-11 MED ORDER — SODIUM CHLORIDE 0.9 % IV SOLN
2400.0000 mg/m2 | INTRAVENOUS | Status: DC
  Administered 2016-07-11: 4550 mg via INTRAVENOUS
  Filled 2016-07-11: qty 91

## 2016-07-11 MED ORDER — LEUCOVORIN CALCIUM INJECTION 350 MG
400.0000 mg/m2 | Freq: Once | INTRAMUSCULAR | Status: AC
Start: 2016-07-11 — End: 2016-07-11
  Administered 2016-07-11: 756 mg via INTRAVENOUS
  Filled 2016-07-11: qty 37.8

## 2016-07-11 MED ORDER — INFLUENZA VAC SPLIT QUAD 0.5 ML IM SUSY
0.5000 mL | PREFILLED_SYRINGE | Freq: Once | INTRAMUSCULAR | Status: AC
  Administered 2016-07-11: 0.5 mL via INTRAMUSCULAR
  Filled 2016-07-11: qty 0.5

## 2016-07-11 MED ORDER — LEUCOVORIN CALCIUM INJECTION 350 MG: 400.0000 mg/m2 | Freq: Once | INTRAVENOUS | Status: DC

## 2016-07-11 MED ORDER — PALONOSETRON HCL INJECTION 0.25 MG/5ML
INTRAVENOUS | Status: AC
  Filled 2016-07-11: qty 5

## 2016-07-11 NOTE — Patient Instructions (Signed)
[facility] Discharge Instructions for Patients Receiving Chemotherapy  Today you received the following chemotherapy agents:  Irinotecan, Leucovorin, Fluorouracil  To help prevent nausea and vomiting after your treatment, we encourage you to take your nausea medication as prescribed.   If you develop nausea and vomiting that is not controlled by your nausea medication, call the clinic.   BELOW ARE SYMPTOMS THAT SHOULD BE REPORTED IMMEDIATELY:  *FEVER GREATER THAN 100.5 F  *CHILLS WITH OR WITHOUT FEVER  NAUSEA AND VOMITING THAT IS NOT CONTROLLED WITH YOUR NAUSEA MEDICATION  *UNUSUAL SHORTNESS OF BREATH  *UNUSUAL BRUISING OR BLEEDING  TENDERNESS IN MOUTH AND THROAT WITH OR WITHOUT PRESENCE OF ULCERS  *URINARY PROBLEMS  *BOWEL PROBLEMS  UNUSUAL RASH Items with * indicate a potential emergency and should be followed up as soon as possible.  Feel free to call the clinic you have any questions or concerns. The clinic phone number is [phone].  Please show the CHEMO ALERT CARD at check-in to the Emergency Department and triage nurse.   

## 2016-07-11 NOTE — Progress Notes (Signed)
  [place] OFFICE PROGRESS NOTE   Diagnosis:  Pancreas cancer  INTERVAL HISTORY:   Mr. Choksi returns as scheduled. He completed another cycle of FOLFIRI 06/19/2016. He denies nausea/vomiting. No mouth sores. No diarrhea. No fever. No abdominal pain. He has a good appetite.  Objective:  Vital signs in last 24 hours:  Blood pressure (!) 162/76, pulse 61, temperature 98.4 F (36.9 C), temperature source Oral, resp. rate 18, height 6' (1.829 m), weight 159 lb 8 oz (72.3 kg), SpO2 100 %.    HEENT: No thrush or ulcers. Resp: Lungs clear bilaterally. Cardio: Regular rate and rhythm. GI: Abdomen soft and nontender. No hepatomegaly. No mass. Vascular: No leg edema. Port-A-Cath without erythema.  Lab Results:  Lab Results  Component Value Date   WBC 5.9 07/11/2016   HGB 10.0 (L) 07/11/2016   HCT 30.5 (L) 07/11/2016   MCV 93.6 07/11/2016   PLT 153 07/11/2016   NEUTROABS 4.4 07/11/2016    Imaging:  No results found.  Medications: I have reviewed the patient's current medications.  Assessment/Plan: 1. 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 Upper endoscopy 11/18/2015 confirmed duodenal invasion/obstruction with a biopsy confirming adenocarcinoma ? Placement of a duodenal stent 11/22/2015 ? MRI abdomen 11/15/2015 revealed a pancreas head mass and liver metastases ? Cycle 1 FOLFIRINOX 12/06/2015 ? Cycle 2 FOLFIRINOX 12/21/2015 ? Cycle 3 FOLFIRINOX 01/04/2016 ? CT abdomen/pelvis 01/19/2016 showed improvement in the pancreatic head mass and liver metastases. ? Cycle 4 FOLFIRINOX 02/01/2016 ? Cycle 5 FOLFIRINOX 02/15/2016 ? Cycle 6 FOLFIRINOX 02/29/2016 ? Cycle 7 FOLFIRINOX 03/14/2016 ? Cycle 8 FOLFIRINOX 03/28/2016 ? CT abdomen/pelvis 04/09/2016-improvement in pancreas head mass and liver metastases, no new metastatic disease ? Chemotherapy switch to FOLFIRI every 3 weeks beginning  04/11/2016  2. Diabetes  3. History of Anorexia/weight loss secondary to #1  4. Obstructive jaundice secondary to #1-status post placement of a percutaneous internal/external biliary drain 11/25/2015; biliary drainage catheter capped 12/02/2015; biliary drain internalized 12/13/2015  5. Port-A-Cath placement 11/30/2015 interventional radiology  6. Admission 01/17/2016 with a high fever and rigors-no apparent source for infection upon review of his history and examination, blood cultures positive for gram-positive cocci--viridans streptococcus--completed 2 weeks of IV ceftriaxone; no endocarditis on TTE.  7. Oxaliplatin neuropathy  8. Elevated transaminases, alkaline phosphatase, and bilirubin 06/13/2016. 06/19/2016 bilirubin improved to normal range; liver enzymes remain elevated.   Disposition: Mr. Lazer appears stable. The plan is to continue every 3 week FOLFIRI. We will obtain restaging CT scans prior to his next visit in 3 weeks.  Liver enzymes remain elevated. Bilirubin is normal. I reviewed the labs with Dr. [name] and the [facility] pharmacist. No dose adjustment necessary.  The influenza vaccine will be administered today.  He will return for a follow-up visit on 08/01/2016. We will schedule the CT scans a few days prior. He will contact the office prior to his next visit with any problems.  Plan reviewed with Dr. [name].    [name] ANP/GNP-BC   07/11/2016  9:48 AM

## 2016-07-11 NOTE — Telephone Encounter (Signed)
Gave patient avs report and appointments for September thru November  °

## 2016-07-12 LAB — CANCER ANTIGEN 19-9: CAN 19-9: 152 U/mL — AB (ref 0–35)

## 2016-07-13 ENCOUNTER — Ambulatory Visit: Payer: Medicare Other

## 2016-07-13 ENCOUNTER — Ambulatory Visit (HOSPITAL_BASED_OUTPATIENT_CLINIC_OR_DEPARTMENT_OTHER): Payer: Medicare Other

## 2016-07-13 VITALS — BP 132/73 | HR 57 | Temp 98.4°F | Resp 18

## 2016-07-13 DIAGNOSIS — Z5189 Encounter for other specified aftercare: Secondary | ICD-10-CM

## 2016-07-13 DIAGNOSIS — C250 Malignant neoplasm of head of pancreas: Secondary | ICD-10-CM | POA: Diagnosis not present

## 2016-07-13 DIAGNOSIS — Z452 Encounter for adjustment and management of vascular access device: Secondary | ICD-10-CM | POA: Diagnosis not present

## 2016-07-13 MED ORDER — PEGFILGRASTIM INJECTION 6 MG/0.6ML ~~LOC~~
6.0000 mg | PREFILLED_SYRINGE | Freq: Once | SUBCUTANEOUS | Status: AC
  Administered 2016-07-13: 6 mg via SUBCUTANEOUS
  Filled 2016-07-13: qty 0.6

## 2016-07-13 MED ORDER — SODIUM CHLORIDE 0.9% FLUSH
10.0000 mL | INTRAVENOUS | Status: DC | PRN
  Administered 2016-07-13: 10 mL
  Filled 2016-07-13: qty 10

## 2016-07-13 MED ORDER — HEPARIN SOD (PORK) LOCK FLUSH 100 UNIT/ML IV SOLN
500.0000 [IU] | Freq: Once | INTRAVENOUS | Status: AC | PRN
  Administered 2016-07-13: 500 [IU]
  Filled 2016-07-13: qty 5

## 2016-07-13 NOTE — Patient Instructions (Signed)
Pegfilgrastim injection What is this medicine? PEGFILGRASTIM (PEG fil gra stim) is a long-acting granulocyte colony-stimulating factor that stimulates the growth of neutrophils, a type of white blood cell important in the body's fight against infection. It is used to reduce the incidence of fever and infection in patients with certain types of cancer who are receiving chemotherapy that affects the bone marrow, and to increase survival after being exposed to high doses of radiation. This medicine may be used for other purposes; ask your health care provider or pharmacist if you have questions. What should I tell my health care provider before I take this medicine? They need to know if you have any of these conditions: -kidney disease -latex allergy -ongoing radiation therapy -sickle cell disease -skin reactions to acrylic adhesives (On-Body Injector only) -an unusual or allergic reaction to pegfilgrastim, filgrastim, other medicines, foods, dyes, or preservatives -pregnant or trying to get pregnant -breast-feeding How should I use this medicine? This medicine is for injection under the skin. If you get this medicine at home, you will be taught how to prepare and give the pre-filled syringe or how to use the On-body Injector. Refer to the patient Instructions for Use for detailed instructions. Use exactly as directed. Take your medicine at regular intervals. Do not take your medicine more often than directed. It is important that you put your used needles and syringes in a special sharps container. Do not put them in a trash can. If you do not have a sharps container, call your pharmacist or healthcare provider to get one. Talk to your pediatrician regarding the use of this medicine in children. While this drug may be prescribed for selected conditions, precautions do apply. Overdosage: If you think you have taken too much of this medicine contact a poison control center or emergency room at  once. NOTE: This medicine is only for you. Do not share this medicine with others. What if I miss a dose? It is important not to miss your dose. Call your doctor or health care professional if you miss your dose. If you miss a dose due to an On-body Injector failure or leakage, a new dose should be administered as soon as possible using a single prefilled syringe for manual use. What may interact with this medicine? Interactions have not been studied. Give your health care provider a list of all the medicines, herbs, non-prescription drugs, or dietary supplements you use. Also tell them if you smoke, drink alcohol, or use illegal drugs. Some items may interact with your medicine. This list may not describe all possible interactions. Give your health care provider a list of all the medicines, herbs, non-prescription drugs, or dietary supplements you use. Also tell them if you smoke, drink alcohol, or use illegal drugs. Some items may interact with your medicine. What should I watch for while using this medicine? You may need blood work done while you are taking this medicine. If you are going to need a MRI, CT scan, or other procedure, tell your doctor that you are using this medicine (On-Body Injector only). What side effects may I notice from receiving this medicine? Side effects that you should report to your doctor or health care professional as soon as possible: -allergic reactions like skin rash, itching or hives, swelling of the face, lips, or tongue -dizziness -fever -pain, redness, or irritation at site where injected -pinpoint red spots on the skin -red or dark-brown urine -shortness of breath or breathing problems -stomach or side pain, or pain   at the shoulder -swelling -tiredness -trouble passing urine or change in the amount of urine Side effects that usually do not require medical attention (report to your doctor or health care professional if they continue or are  bothersome): -bone pain -muscle pain This list may not describe all possible side effects. Call your doctor for medical advice about side effects. You may report side effects to FDA at 1-800-FDA-1088. Where should I keep my medicine? Keep out of the reach of children. Store pre-filled syringes in a refrigerator between 2 and 8 degrees C (36 and 46 degrees F). Do not freeze. Keep in carton to protect from light. Throw away this medicine if it is left out of the refrigerator for more than 48 hours. Throw away any unused medicine after the expiration date. NOTE: This sheet is a summary. It may not cover all possible information. If you have questions about this medicine, talk to your doctor, pharmacist, or health care provider.    2016, Elsevier/Gold Standard. (2014-10-28 14:30:14) Implanted Port Home Guide An implanted port is a type of central line that is placed under the skin. Central lines are used to provide IV access when treatment or nutrition needs to be given through a person's veins. Implanted ports are used for long-term IV access. An implanted port may be placed because:   You need IV medicine that would be irritating to the small veins in your hands or arms.   You need long-term IV medicines, such as antibiotics.   You need IV nutrition for a long period.   You need frequent blood draws for lab tests.   You need dialysis.  Implanted ports are usually placed in the chest area, but they can also be placed in the upper arm, the abdomen, or the leg. An implanted port has two main parts:   Reservoir. The reservoir is round and will appear as a small, raised area under your skin. The reservoir is the part where a needle is inserted to give medicines or draw blood.   Catheter. The catheter is a thin, flexible tube that extends from the reservoir. The catheter is placed into a large vein. Medicine that is inserted into the reservoir goes into the catheter and then into the vein.   HOW WILL I CARE FOR MY INCISION SITE? Do not get the incision site wet. Bathe or shower as directed by your health care provider.  HOW IS MY PORT ACCESSED? Special steps must be taken to access the port:   Before the port is accessed, a numbing cream can be placed on the skin. This helps numb the skin over the port site.   Your health care provider uses a sterile technique to access the port.  Your health care provider must put on a mask and sterile gloves.  The skin over your port is cleaned carefully with an antiseptic and allowed to dry.  The port is gently pinched between sterile gloves, and a needle is inserted into the port.  Only "non-coring" port needles should be used to access the port. Once the port is accessed, a blood return should be checked. This helps ensure that the port is in the vein and is not clogged.   If your port needs to remain accessed for a constant infusion, a clear (transparent) bandage will be placed over the needle site. The bandage and needle will need to be changed every week, or as directed by your health care provider.   Keep the bandage covering the   needle clean and dry. Do not get it wet. Follow your health care provider's instructions on how to take a shower or bath while the port is accessed.   If your port does not need to stay accessed, no bandage is needed over the port.  WHAT IS FLUSHING? Flushing helps keep the port from getting clogged. Follow your health care provider's instructions on how and when to flush the port. Ports are usually flushed with saline solution or a medicine called heparin. The need for flushing will depend on how the port is used.   If the port is used for intermittent medicines or blood draws, the port will need to be flushed:   After medicines have been given.   After blood has been drawn.   As part of routine maintenance.   If a constant infusion is running, the port may not need to be flushed.  HOW  LONG WILL MY PORT STAY IMPLANTED? The port can stay in for as long as your health care provider thinks it is needed. When it is time for the port to come out, surgery will be done to remove it. The procedure is similar to the one performed when the port was put in.  WHEN SHOULD I SEEK IMMEDIATE MEDICAL CARE? When you have an implanted port, you should seek immediate medical care if:   You notice a bad smell coming from the incision site.   You have swelling, redness, or drainage at the incision site.   You have more swelling or pain at the port site or the surrounding area.   You have a fever that is not controlled with medicine.   This information is not intended to replace advice given to you by your health care provider. Make sure you discuss any questions you have with your health care provider.   Document Released: 10/08/2005 Document Revised: 07/29/2013 Document Reviewed: 06/15/2013 Elsevier Interactive Patient Education 2016 Elsevier Inc.  

## 2016-07-13 NOTE — Progress Notes (Signed)
Neulasta injection to  Be given in Infusion Area

## 2016-07-14 ENCOUNTER — Other Ambulatory Visit: Payer: Self-pay | Admitting: Oncology

## 2016-07-20 ENCOUNTER — Other Ambulatory Visit: Payer: Self-pay | Admitting: Oncology

## 2016-07-20 DIAGNOSIS — C250 Malignant neoplasm of head of pancreas: Secondary | ICD-10-CM

## 2016-07-20 MED ORDER — PANCRELIPASE (LIP-PROT-AMYL) 36000-114000 UNITS PO CPEP: 36000.0000 [IU] | ORAL_CAPSULE | Freq: Three times a day (TID) | ORAL | 1 refills | Status: DC

## 2016-07-23 ENCOUNTER — Other Ambulatory Visit: Payer: Self-pay | Admitting: Family Medicine

## 2016-07-29 ENCOUNTER — Other Ambulatory Visit: Payer: Self-pay | Admitting: Oncology

## 2016-07-30 ENCOUNTER — Ambulatory Visit (HOSPITAL_COMMUNITY)
Admission: RE | Admit: 2016-07-30 | Discharge: 2016-07-30 | Disposition: A | Discharge status: Home / Self Care | Payer: Medicare Other | Source: Ambulatory Visit | Attending: Nurse Practitioner | Admitting: Nurse Practitioner

## 2016-07-30 ENCOUNTER — Encounter (HOSPITAL_COMMUNITY): Payer: Self-pay

## 2016-07-30 DIAGNOSIS — C799 Secondary malignant neoplasm of unspecified site: Secondary | ICD-10-CM

## 2016-07-30 DIAGNOSIS — R590 Localized enlarged lymph nodes: Secondary | ICD-10-CM | POA: Insufficient documentation

## 2016-07-30 DIAGNOSIS — I700 Atherosclerosis of aorta: Secondary | ICD-10-CM | POA: Insufficient documentation

## 2016-07-30 DIAGNOSIS — C250 Malignant neoplasm of head of pancreas: Secondary | ICD-10-CM | POA: Diagnosis not present

## 2016-07-30 DIAGNOSIS — Z9689 Presence of other specified functional implants: Secondary | ICD-10-CM | POA: Insufficient documentation

## 2016-07-30 DIAGNOSIS — C787 Secondary malignant neoplasm of liver and intrahepatic bile duct: Secondary | ICD-10-CM | POA: Insufficient documentation

## 2016-07-30 MED ORDER — IOPAMIDOL (ISOVUE-300) INJECTION 61%
30.0000 mL | Freq: Once | INTRAVENOUS | Status: DC | PRN
  Administered 2016-07-30: 30 mL via ORAL
  Filled 2016-07-30: qty 30

## 2016-07-30 MED ORDER — IOPAMIDOL (ISOVUE-300) INJECTION 61%
100.0000 mL | Freq: Once | INTRAVENOUS | Status: AC | PRN
  Administered 2016-07-30: 100 mL via INTRAVENOUS

## 2016-07-31 ENCOUNTER — Telehealth: Payer: Self-pay | Admitting: *Deleted

## 2016-07-31 NOTE — Telephone Encounter (Signed)
-----   Message from Ladell Pier, MD sent at 07/30/2016  5:21 PM EDT ----- Please call patient, liver lesions are smaller, f/u as scheduled

## 2016-07-31 NOTE — Telephone Encounter (Signed)
Spoke with pt's wife, informed her of CT result. She voiced understanding, they will follow up 10/11 as scheduled.

## 2016-08-01 ENCOUNTER — Ambulatory Visit (HOSPITAL_BASED_OUTPATIENT_CLINIC_OR_DEPARTMENT_OTHER): Payer: Medicare Other

## 2016-08-01 ENCOUNTER — Ambulatory Visit: Payer: Medicare Other

## 2016-08-01 ENCOUNTER — Telehealth: Payer: Self-pay | Admitting: Oncology

## 2016-08-01 ENCOUNTER — Other Ambulatory Visit (HOSPITAL_BASED_OUTPATIENT_CLINIC_OR_DEPARTMENT_OTHER): Payer: Medicare Other

## 2016-08-01 ENCOUNTER — Telehealth: Payer: Self-pay | Admitting: *Deleted

## 2016-08-01 ENCOUNTER — Ambulatory Visit (HOSPITAL_BASED_OUTPATIENT_CLINIC_OR_DEPARTMENT_OTHER): Payer: Medicare Other | Admitting: Oncology

## 2016-08-01 VITALS — BP 165/59 | HR 62 | Temp 98.6°F | Resp 18 | Ht 72.0 in | Wt 160.3 lb

## 2016-08-01 DIAGNOSIS — C250 Malignant neoplasm of head of pancreas: Secondary | ICD-10-CM

## 2016-08-01 DIAGNOSIS — C787 Secondary malignant neoplasm of liver and intrahepatic bile duct: Secondary | ICD-10-CM | POA: Diagnosis not present

## 2016-08-01 DIAGNOSIS — Z5111 Encounter for antineoplastic chemotherapy: Secondary | ICD-10-CM

## 2016-08-01 DIAGNOSIS — C799 Secondary malignant neoplasm of unspecified site: Secondary | ICD-10-CM

## 2016-08-01 DIAGNOSIS — Z95828 Presence of other vascular implants and grafts: Secondary | ICD-10-CM

## 2016-08-01 LAB — CBC WITH DIFFERENTIAL/PLATELET
BASO%: 1.1 % (ref 0.0–2.0)
BASOS ABS: 0.1 10*3/uL (ref 0.0–0.1)
EOS%: 2.8 % (ref 0.0–7.0)
Eosinophils Absolute: 0.1 10*3/uL (ref 0.0–0.5)
HEMATOCRIT: 31 % — AB (ref 38.4–49.9)
HGB: 10.1 g/dL — ABNORMAL LOW (ref 13.0–17.1)
LYMPH%: 14.2 % (ref 14.0–49.0)
MCH: 30.9 pg (ref 27.2–33.4)
MCHC: 32.7 g/dL (ref 32.0–36.0)
MCV: 94.6 fL (ref 79.3–98.0)
MONO#: 0.6 10*3/uL (ref 0.1–0.9)
MONO%: 12.9 % (ref 0.0–14.0)
NEUT#: 3.4 10*3/uL (ref 1.5–6.5)
NEUT%: 69 % (ref 39.0–75.0)
Platelets: 154 10*3/uL (ref 140–400)
RBC: 3.28 10*6/uL — AB (ref 4.20–5.82)
RDW: 17.4 % — ABNORMAL HIGH (ref 11.0–14.6)
WBC: 4.9 10*3/uL (ref 4.0–10.3)
lymph#: 0.7 10*3/uL — ABNORMAL LOW (ref 0.9–3.3)

## 2016-08-01 LAB — COMPREHENSIVE METABOLIC PANEL
ALT: 107 U/L — AB (ref 0–55)
AST: 97 U/L — AB (ref 5–34)
Albumin: 3.2 g/dL — ABNORMAL LOW (ref 3.5–5.0)
Alkaline Phosphatase: 902 U/L — ABNORMAL HIGH (ref 40–150)
Anion Gap: 9 mEq/L (ref 3–11)
BUN: 14.9 mg/dL (ref 7.0–26.0)
CALCIUM: 8.8 mg/dL (ref 8.4–10.4)
CHLORIDE: 105 meq/L (ref 98–109)
CO2: 24 meq/L (ref 22–29)
Creatinine: 0.9 mg/dL (ref 0.7–1.3)
EGFR: 84 mL/min/{1.73_m2} — ABNORMAL LOW (ref >90)
Glucose: 262 mg/dl — ABNORMAL HIGH (ref 70–140)
POTASSIUM: 4.1 meq/L (ref 3.5–5.1)
Sodium: 139 mEq/L (ref 136–145)
Total Bilirubin: 0.93 mg/dL (ref 0.20–1.20)
Total Protein: 6.3 g/dL — ABNORMAL LOW (ref 6.4–8.3)

## 2016-08-01 MED ORDER — ATROPINE SULFATE 1 MG/ML IJ SOLN: 0.5000 mg | Freq: Once | INTRAMUSCULAR | Status: DC | PRN

## 2016-08-01 MED ORDER — PALONOSETRON HCL INJECTION 0.25 MG/5ML
0.2500 mg | Freq: Once | INTRAVENOUS | Status: AC
  Administered 2016-08-01: 0.25 mg via INTRAVENOUS

## 2016-08-01 MED ORDER — PALONOSETRON HCL INJECTION 0.25 MG/5ML
INTRAVENOUS | Status: AC
  Filled 2016-08-01: qty 5

## 2016-08-01 MED ORDER — FOSAPREPITANT DIMEGLUMINE INJECTION 150 MG
Freq: Once | INTRAVENOUS | Status: AC
  Administered 2016-08-01: 10:00:00 via INTRAVENOUS
  Filled 2016-08-01: qty 5

## 2016-08-01 MED ORDER — FLUOROURACIL CHEMO INJECTION 5 GM/100ML
2400.0000 mg/m2 | INTRAVENOUS | Status: DC
  Administered 2016-08-01: 4550 mg via INTRAVENOUS
  Filled 2016-08-01: qty 91

## 2016-08-01 MED ORDER — SODIUM CHLORIDE 0.9 % IV SOLN
180.0000 mg/m2 | Freq: Once | INTRAVENOUS | Status: AC
  Administered 2016-08-01: 340 mg via INTRAVENOUS
  Filled 2016-08-01: qty 15

## 2016-08-01 MED ORDER — SODIUM CHLORIDE 0.9 % IV SOLN
400.0000 mg/m2 | Freq: Once | INTRAVENOUS | Status: AC
  Administered 2016-08-01: 756 mg via INTRAVENOUS
  Filled 2016-08-01: qty 37.8

## 2016-08-01 MED ORDER — SODIUM CHLORIDE 0.9 % IJ SOLN
10.0000 mL | INTRAMUSCULAR | Status: DC | PRN
  Administered 2016-08-01: 10 mL via INTRAVENOUS
  Filled 2016-08-01: qty 10

## 2016-08-01 NOTE — Patient Instructions (Signed)
[facility] Discharge Instructions for Patients Receiving Chemotherapy  Today you received the following chemotherapy agents:  Leucovorin, Irinotecan, Fluorouracil  To help prevent nausea and vomiting after your treatment, we encourage you to take your nausea medication as prescribed.   If you develop nausea and vomiting that is not controlled by your nausea medication, call the clinic.   BELOW ARE SYMPTOMS THAT SHOULD BE REPORTED IMMEDIATELY:  *FEVER GREATER THAN 100.5 F  *CHILLS WITH OR WITHOUT FEVER  NAUSEA AND VOMITING THAT IS NOT CONTROLLED WITH YOUR NAUSEA MEDICATION  *UNUSUAL SHORTNESS OF BREATH  *UNUSUAL BRUISING OR BLEEDING  TENDERNESS IN MOUTH AND THROAT WITH OR WITHOUT PRESENCE OF ULCERS  *URINARY PROBLEMS  *BOWEL PROBLEMS  UNUSUAL RASH Items with * indicate a potential emergency and should be followed up as soon as possible.  Feel free to call the clinic you have any questions or concerns. The clinic phone number is [phone].  Please show the [place] at check-in to the Emergency Department and triage nurse.

## 2016-08-01 NOTE — Progress Notes (Signed)
[place] OFFICE PROGRESS NOTE   Diagnosis: Pancreas cancer  INTERVAL HISTORY:   Jacob Hardin returns as scheduled. He completed another cycle of FOLFIRI 07/11/2016. He denies nausea/vomiting, mouth sores, and diarrhea. He continues to have 3-4 bowel movements per day while taking Creon. No pain. Good appetite and energy level. Persistent neuropathy symptoms in the extremities.  Objective:  Vital signs in last 24 hours:  Blood pressure (!) 165/59, pulse 62, temperature 98.6 F (37 C), temperature source Oral, resp. rate 18, height 6' (1.829 m), weight 160 lb 4.8 oz (72.7 kg), SpO2 100 %.    HEENT: No thrush or ulcers Resp: Lungs clear bilaterally Cardio: Regular rate and rhythm GI: No hepatomegaly, no mass, nontender Vascular: No leg edema   Portacath/PICC-without erythema  Lab Results:  Lab Results  Component Value Date   WBC 4.9 08/01/2016   HGB 10.1 (L) 08/01/2016   HCT 31.0 (L) 08/01/2016   MCV 94.6 08/01/2016   PLT 154 08/01/2016   NEUTROABS 3.4 08/01/2016    CA 19-9 on 07/11/2016-152 Imaging:  Ct Abdomen Pelvis W Contrast  Result Date: 07/30/2016 CLINICAL DATA:  70 year old male with history of pancreatic cancer diagnosed in January 2017. Chemotherapy in progress. EXAM: CT ABDOMEN AND PELVIS WITH CONTRAST TECHNIQUE: Multidetector CT imaging of the abdomen and pelvis was performed using the standard protocol following bolus administration of intravenous contrast. CONTRAST:  162mL ISOVUE-300 IOPAMIDOL (ISOVUE-300) INJECTION 61% COMPARISON:  Multiple priors, most recently 04/09/2016. FINDINGS: Lower chest: No suspicious pulmonary nodules in the visualize lung bases. Atherosclerotic calcifications in the right coronary artery. Atherosclerosis in the distal descending thoracic aorta. Prominent but nonenlarged retrocrural lymph nodes measuring up to 8 mm in short axis. Hepatobiliary: Again noted are numerous hypovascular lesions scattered throughout the  liver, however, these appear decreased in both number and size. Specific examples include a 1.0 x 1.0 cm lesion in segment 8 (image 14 of series 4) which previously measured 1.7 x 1.5 cm, a 1.0 x 0.8 cm lesion in the periphery of segment 2 (image 16 of series 4), which previously measured 1.2 x 1.3 cm, and they 1.1 x 1.5 cm lesion in inferior aspect of the right lobe of the liver between segments 5 and 6 (image 30 of series 4) which currently measures 1.1 x 1.5 cm (previously 1.6 x 2.0 cm). There continues to be mild intrahepatic biliary ductal dilatation and pneumobilia throughout much of the left lobe of the liver. Common bile duct stent in place extending to the level of the ampulla. Pancreas: Pancreatic atrophy again noted. Pancreatic head mass remains ill-defined and difficult to discretely measure, however, this appears essentially unchanged compared to the prior examination. The bulkiest portion of the lesion is noted superiorly immediately anterior to the splenoportal confluence, where this portion of the lesion measures 2.0 x 1.8 cm (image 39 of series 2). Ductal ectasia throughout the body and tail of the pancreas, similar to the prior study. Spleen: Unremarkable. Adrenals/Urinary Tract: Bilateral kidneys and bilateral adrenal glands are normal in appearance. No hydroureteronephrosis or perinephric stranding to indicate urinary tract obstruction at this time. Urinary bladder is normal in appearance. Stomach/Bowel: Normal appearance of the stomach. Duodenal stent extending from the duodenal bulb to the distal second portion of the duodenum appears unchanged. No pathologic dilatation of small bowel or colon. Normal appendix. Vascular/Lymphatic: Aortic atherosclerosis, without definite aneurysm in the abdominal or pelvic vasculature. Multiple small peripancreatic lymph nodes similar to prior examinations, measuring up to 9 mm in short axis (  image 55 of series 6). Reproductive: Prostate gland and seminal  vesicles are unremarkable in appearance. Other: No significant volume of ascites.  No pneumoperitoneum. Musculoskeletal: There are no aggressive appearing lytic or blastic lesions noted in the visualized portions of the skeleton. IMPRESSION: 1. Overall, today's study demonstrates continued positive response to therapy, as demonstrated by a continued decrease in number and size of numerous metastatic lesions to the liver. Primary lesion in the pancreatic head is difficult to discretely measure, but appears relatively similar to prior examinations. Numerous borderline enlarged peripancreatic lymph nodes are similar to prior studies. 2. CBD stent and duodenal stent both appear similarly positioned, and appear to be patent. 3. Aortic atherosclerosis. Electronically Signed   By: Vinnie Langton M.D.   On: 07/30/2016 09:41    Medications: I have reviewed the patient's current medications.  Assessment/Plan: 1. 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 Upper endoscopy 11/18/2015 confirmed duodenal invasion/obstruction with a biopsy confirming adenocarcinoma ? Placement of a duodenal stent 11/22/2015 ? MRI abdomen 11/15/2015 revealed a pancreas head mass and liver metastases ? Cycle 1 FOLFIRINOX 12/06/2015 ? Cycle 2 FOLFIRINOX 12/21/2015 ? Cycle 3 FOLFIRINOX 01/04/2016 ? CT abdomen/pelvis 01/19/2016 showed improvement in the pancreatic head mass and liver metastases. ? Cycle 4 FOLFIRINOX 02/01/2016 ? Cycle 5 FOLFIRINOX 02/15/2016 ? Cycle 6 FOLFIRINOX 02/29/2016 ? Cycle 7 FOLFIRINOX 03/14/2016 ? Cycle 8 FOLFIRINOX 03/28/2016 ? CT abdomen/pelvis 04/09/2016-improvement in pancreas head mass and liver metastases, no new metastatic disease ? Chemotherapy switch to FOLFIRI every 3 weeks beginning 04/11/2016 ? CT 07/30/2016-improvement in liver metastases, stable pancreas mass  ? FOLFIRI continued on a 3 week schedule  2.  Diabetes  3. History of Anorexia/weight loss secondary to #1  4. Obstructive jaundice secondary to #1-status post placement of a percutaneous internal/external biliary drain 11/25/2015; biliary drainage catheter capped 12/02/2015; biliary drain internalized 12/13/2015  5. Port-A-Cath placement 11/30/2015 interventional radiology  6. Admission 01/17/2016 with a high fever and rigors-no apparent source for infection upon review of his history and examination, blood cultures positive for gram-positive cocci--viridans streptococcus--completed 2 weeks of IV ceftriaxone; no endocarditis on TTE.  7. Oxaliplatin neuropathy  8. Elevated transaminases, alkaline phosphatase, and bilirubin 06/13/2016.06/19/2016 bilirubin improved to normal range; liver enzymes remain elevated.     Disposition:  Jacob Hardin appears unchanged. He continues to have a good performance status. He is tolerating chemotherapy well. The restaging CT reveals improvement in the liver metastases and no evidence of disease progression.  I discussed treatment options with Jacob Hardin and his wife. He will continue every three-week FOLFIRI.    Betsy Coder, MD  08/01/2016  9:32 AM

## 2016-08-01 NOTE — Progress Notes (Signed)
OK to treat with elevated liver enzymes today, per Dr. [name].

## 2016-08-01 NOTE — Telephone Encounter (Signed)
Per LOS I have scheduled appts and notified the scheduler 

## 2016-08-01 NOTE — Telephone Encounter (Signed)
Message sent to chemo scheduler to add chemo. Follow up, lab and flush appointment scheduled per 08/01/16 los. Avs report and appointment schedule given to patient, per 08/01/16 los.

## 2016-08-02 LAB — CANCER ANTIGEN 19-9: CA 19-9: 154 U/mL — ABNORMAL HIGH (ref 0–35)

## 2016-08-03 ENCOUNTER — Ambulatory Visit (HOSPITAL_BASED_OUTPATIENT_CLINIC_OR_DEPARTMENT_OTHER): Payer: Medicare Other

## 2016-08-03 ENCOUNTER — Ambulatory Visit: Payer: Medicare Other

## 2016-08-03 DIAGNOSIS — C250 Malignant neoplasm of head of pancreas: Secondary | ICD-10-CM | POA: Diagnosis not present

## 2016-08-03 MED ORDER — PEGFILGRASTIM INJECTION 6 MG/0.6ML ~~LOC~~
6.0000 mg | PREFILLED_SYRINGE | Freq: Once | SUBCUTANEOUS | Status: AC
  Administered 2016-08-03: 6 mg via SUBCUTANEOUS
  Filled 2016-08-03: qty 0.6

## 2016-08-03 MED ORDER — HEPARIN SOD (PORK) LOCK FLUSH 100 UNIT/ML IV SOLN
500.0000 [IU] | Freq: Once | INTRAVENOUS | Status: AC | PRN
  Administered 2016-08-03: 500 [IU]
  Filled 2016-08-03: qty 5

## 2016-08-03 MED ORDER — SODIUM CHLORIDE 0.9% FLUSH
10.0000 mL | INTRAVENOUS | Status: DC | PRN
  Administered 2016-08-03: 10 mL
  Filled 2016-08-03: qty 10

## 2016-08-18 ENCOUNTER — Other Ambulatory Visit: Payer: Self-pay | Admitting: Oncology

## 2016-08-22 ENCOUNTER — Ambulatory Visit (HOSPITAL_BASED_OUTPATIENT_CLINIC_OR_DEPARTMENT_OTHER): Payer: Medicare Other | Admitting: Oncology

## 2016-08-22 ENCOUNTER — Other Ambulatory Visit (HOSPITAL_BASED_OUTPATIENT_CLINIC_OR_DEPARTMENT_OTHER): Payer: Medicare Other

## 2016-08-22 ENCOUNTER — Ambulatory Visit (HOSPITAL_BASED_OUTPATIENT_CLINIC_OR_DEPARTMENT_OTHER): Payer: Medicare Other

## 2016-08-22 ENCOUNTER — Telehealth: Payer: Self-pay | Admitting: *Deleted

## 2016-08-22 ENCOUNTER — Telehealth: Payer: Self-pay | Admitting: Oncology

## 2016-08-22 VITALS — BP 154/71 | HR 62 | Temp 97.3°F | Resp 17 | Ht 72.0 in | Wt 161.0 lb

## 2016-08-22 DIAGNOSIS — C250 Malignant neoplasm of head of pancreas: Secondary | ICD-10-CM

## 2016-08-22 DIAGNOSIS — Z5111 Encounter for antineoplastic chemotherapy: Secondary | ICD-10-CM | POA: Diagnosis not present

## 2016-08-22 DIAGNOSIS — C787 Secondary malignant neoplasm of liver and intrahepatic bile duct: Secondary | ICD-10-CM

## 2016-08-22 DIAGNOSIS — Z452 Encounter for adjustment and management of vascular access device: Secondary | ICD-10-CM

## 2016-08-22 DIAGNOSIS — G620 Drug-induced polyneuropathy: Secondary | ICD-10-CM

## 2016-08-22 DIAGNOSIS — E119 Type 2 diabetes mellitus without complications: Secondary | ICD-10-CM | POA: Diagnosis not present

## 2016-08-22 DIAGNOSIS — C799 Secondary malignant neoplasm of unspecified site: Secondary | ICD-10-CM

## 2016-08-22 DIAGNOSIS — R740 Nonspecific elevation of levels of transaminase and lactic acid dehydrogenase [LDH]: Secondary | ICD-10-CM

## 2016-08-22 DIAGNOSIS — Z95828 Presence of other vascular implants and grafts: Secondary | ICD-10-CM

## 2016-08-22 LAB — COMPREHENSIVE METABOLIC PANEL
ALT: 96 U/L — AB (ref 0–55)
ANION GAP: 8 meq/L (ref 3–11)
AST: 80 U/L — ABNORMAL HIGH (ref 5–34)
Albumin: 3.3 g/dL — ABNORMAL LOW (ref 3.5–5.0)
Alkaline Phosphatase: 799 U/L — ABNORMAL HIGH (ref 40–150)
BILIRUBIN TOTAL: 0.78 mg/dL (ref 0.20–1.20)
BUN: 13.5 mg/dL (ref 7.0–26.0)
CALCIUM: 9.1 mg/dL (ref 8.4–10.4)
CO2: 25 mEq/L (ref 22–29)
CREATININE: 0.9 mg/dL (ref 0.7–1.3)
Chloride: 104 mEq/L (ref 98–109)
EGFR: 88 mL/min/{1.73_m2} — ABNORMAL LOW (ref >90)
Glucose: 249 mg/dl — ABNORMAL HIGH (ref 70–140)
Potassium: 3.8 mEq/L (ref 3.5–5.1)
Sodium: 138 mEq/L (ref 136–145)
TOTAL PROTEIN: 6.6 g/dL (ref 6.4–8.3)

## 2016-08-22 LAB — CBC WITH DIFFERENTIAL/PLATELET
BASO%: 0.7 % (ref 0.0–2.0)
BASOS ABS: 0 10*3/uL (ref 0.0–0.1)
EOS%: 2.3 % (ref 0.0–7.0)
Eosinophils Absolute: 0.1 10*3/uL (ref 0.0–0.5)
HCT: 32 % — ABNORMAL LOW (ref 38.4–49.9)
HGB: 10.5 g/dL — ABNORMAL LOW (ref 13.0–17.1)
LYMPH%: 14.7 % (ref 14.0–49.0)
MCH: 31 pg (ref 27.2–33.4)
MCHC: 32.8 g/dL (ref 32.0–36.0)
MCV: 94.4 fL (ref 79.3–98.0)
MONO#: 0.6 10*3/uL (ref 0.1–0.9)
MONO%: 9.3 % (ref 0.0–14.0)
NEUT#: 4.5 10*3/uL (ref 1.5–6.5)
NEUT%: 73 % (ref 39.0–75.0)
Platelets: 143 10*3/uL (ref 140–400)
RBC: 3.39 10*6/uL — AB (ref 4.20–5.82)
RDW: 15.7 % — AB (ref 11.0–14.6)
WBC: 6.1 10*3/uL (ref 4.0–10.3)
lymph#: 0.9 10*3/uL (ref 0.9–3.3)
nRBC: 0 % (ref 0–0)

## 2016-08-22 MED ORDER — FLUOROURACIL CHEMO INJECTION 5 GM/100ML
2400.0000 mg/m2 | INTRAVENOUS | Status: DC
  Administered 2016-08-22: 4550 mg via INTRAVENOUS
  Filled 2016-08-22: qty 91

## 2016-08-22 MED ORDER — DEXTROSE 5 % IV SOLN: Freq: Once | INTRAVENOUS | Status: DC

## 2016-08-22 MED ORDER — SODIUM CHLORIDE 0.9 % IV SOLN
180.0000 mg/m2 | Freq: Once | INTRAVENOUS | Status: AC
  Administered 2016-08-22: 340 mg via INTRAVENOUS
  Filled 2016-08-22: qty 15

## 2016-08-22 MED ORDER — PALONOSETRON HCL INJECTION 0.25 MG/5ML
INTRAVENOUS | Status: AC
  Filled 2016-08-22: qty 5

## 2016-08-22 MED ORDER — SODIUM CHLORIDE 0.9 % IV SOLN
Freq: Once | INTRAVENOUS | Status: AC
  Administered 2016-08-22: 11:00:00 via INTRAVENOUS

## 2016-08-22 MED ORDER — SODIUM CHLORIDE 0.9 % IV SOLN
400.0000 mg/m2 | Freq: Once | INTRAVENOUS | Status: AC
  Administered 2016-08-22: 756 mg via INTRAVENOUS
  Filled 2016-08-22: qty 37.8

## 2016-08-22 MED ORDER — SODIUM CHLORIDE 0.9 % IV SOLN
Freq: Once | INTRAVENOUS | Status: AC
  Administered 2016-08-22: 11:00:00 via INTRAVENOUS
  Filled 2016-08-22: qty 5

## 2016-08-22 MED ORDER — PANCRELIPASE (LIP-PROT-AMYL) 36000-114000 UNITS PO CPEP: 36000.0000 [IU] | ORAL_CAPSULE | Freq: Three times a day (TID) | ORAL | 1 refills | Status: DC

## 2016-08-22 MED ORDER — PALONOSETRON HCL INJECTION 0.25 MG/5ML
0.2500 mg | Freq: Once | INTRAVENOUS | Status: AC
  Administered 2016-08-22: 0.25 mg via INTRAVENOUS

## 2016-08-22 MED ORDER — SODIUM CHLORIDE 0.9 % IJ SOLN
10.0000 mL | INTRAMUSCULAR | Status: DC | PRN
  Administered 2016-08-22: 10 mL via INTRAVENOUS
  Filled 2016-08-22: qty 10

## 2016-08-22 NOTE — Progress Notes (Signed)
OK to treat with elevated AST/ALT, per Dr. [name]. Joni, Infusion RN made aware.

## 2016-08-22 NOTE — Patient Instructions (Signed)
[place] Discharge Instructions for Patients Receiving Chemotherapy  Today you received the following chemotherapy agents:  Leucovorin, Irinotecan, Fluorouracil  To help prevent nausea and vomiting after your treatment, we encourage you to take your nausea medication as prescribed.   If you develop nausea and vomiting that is not controlled by your nausea medication, call the clinic.   BELOW ARE SYMPTOMS THAT SHOULD BE REPORTED IMMEDIATELY:  *FEVER GREATER THAN 100.5 F  *CHILLS WITH OR WITHOUT FEVER  NAUSEA AND VOMITING THAT IS NOT CONTROLLED WITH YOUR NAUSEA MEDICATION  *UNUSUAL SHORTNESS OF BREATH  *UNUSUAL BRUISING OR BLEEDING  TENDERNESS IN MOUTH AND THROAT WITH OR WITHOUT PRESENCE OF ULCERS  *URINARY PROBLEMS  *BOWEL PROBLEMS  UNUSUAL RASH Items with * indicate a potential emergency and should be followed up as soon as possible.  Feel free to call the clinic you have any questions or concerns. The clinic phone number is [phone].  Please show the [place] at check-in to the Emergency Department and triage nurse.

## 2016-08-22 NOTE — Telephone Encounter (Signed)
Per LOS I have scheduled appts and notified the scheduler 

## 2016-08-22 NOTE — Progress Notes (Signed)
  [name] OFFICE PROGRESS NOTE   Diagnosis:  Pancreas cancer  INTERVAL HISTORY:   Jacob Hardin returns as scheduled. He completed another cycle of FOLFIRI on 08/01/2016. He tolerated chemotherapy well. He reports a good appetite and energy level. Minimal neuropathy symptoms in the fingers. This does not interfere with activity. No new complaint.  Objective:  Vital signs in last 24 hours:  Blood pressure (!) 154/71, pulse 62, temperature 97.3 F (36.3 C), temperature source Oral, resp. rate 17, height 6' (1.829 m), weight 161 lb (73 kg), SpO2 100 %.    HEENT: No thrush or ulcers Resp: Lungs clear bilaterally Cardio: Regular rate and rhythm, 2/6 systolic murmur GI: No hepatosplenomegaly, no mass, nontender Vascular: No leg edema   Portacath/PICC-without erythema  Lab Results:  Lab Results  Component Value Date   WBC 6.1 08/22/2016   HGB 10.5 (L) 08/22/2016   HCT 32.0 (L) 08/22/2016   MCV 94.4 08/22/2016   PLT 143 08/22/2016   NEUTROABS 4.5 08/22/2016   08/01/2016: CA 19-9    154  Medications: I have reviewed the patient's current medications.  Assessment/Plan: 1. 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 Upper endoscopy 11/18/2015 confirmed duodenal invasion/obstruction with a biopsy confirming adenocarcinoma ? Placement of a duodenal stent 11/22/2015 ? MRI abdomen 11/15/2015 revealed a pancreas head mass and liver metastases ? Cycle 1 FOLFIRINOX 12/06/2015 ? Cycle 2 FOLFIRINOX 12/21/2015 ? Cycle 3 FOLFIRINOX 01/04/2016 ? CT abdomen/pelvis 01/19/2016 showed improvement in the pancreatic head mass and liver metastases. ? Cycle 4 FOLFIRINOX 02/01/2016 ? Cycle 5 FOLFIRINOX 02/15/2016 ? Cycle 6 FOLFIRINOX 02/29/2016 ? Cycle 7 FOLFIRINOX 03/14/2016 ? Cycle 8 FOLFIRINOX 03/28/2016 ? CT abdomen/pelvis 04/09/2016-improvement in pancreas head mass and liver metastases, no new  metastatic disease ? Chemotherapy switch to FOLFIRI every 3 weeks beginning 04/11/2016 ? CT 07/30/2016-improvement in liver metastases, stable pancreas mass  ? FOLFIRI continued on a 3 week schedule  2. Diabetes  3. History of Anorexia/weight loss secondary to #1  4. Obstructive jaundice secondary to #1-status post placement of a percutaneous internal/external biliary drain 11/25/2015; biliary drainage catheter capped 12/02/2015; biliary drain internalized 12/13/2015  5. Port-A-Cath placement 11/30/2015 interventional radiology  6. Admission 01/17/2016 with a high fever and rigors-no apparent source for infection upon review of his history and examination, blood cultures positive for gram-positive cocci--viridans streptococcus--completed 2 weeks of IV ceftriaxone; no endocarditis on TTE.  7. Oxaliplatin neuropathy  8. Elevated transaminases, alkaline phosphatase, and bilirubin 06/13/2016.06/19/2016 bilirubin improved to normal range; liver enzymes remain elevated.   Disposition:  Jacob Hardin appears stable. The plan is to continue FOLFIRI. He will complete another cycle of chemotherapy today. He will return for an office visit and chemotherapy on 09/19/2016. The mild elevation of the liver enzymes is likely related to toxicity from chemotherapy. The liver enzymes are stable today.  Betsy Coder, MD  08/22/2016  9:39 AM

## 2016-08-22 NOTE — Telephone Encounter (Signed)
Message sent to chemo scheduler to be added, per 08/22/16 los. AVS report and appointment schedule given to patient, per 08/22/16 los.

## 2016-08-23 LAB — CANCER ANTIGEN 19-9: CA 19-9: 174 U/mL — ABNORMAL HIGH (ref 0–35)

## 2016-08-24 ENCOUNTER — Ambulatory Visit (HOSPITAL_BASED_OUTPATIENT_CLINIC_OR_DEPARTMENT_OTHER): Payer: Medicare Other

## 2016-08-24 VITALS — BP 121/64 | HR 62 | Temp 97.6°F | Resp 16

## 2016-08-24 DIAGNOSIS — Z5189 Encounter for other specified aftercare: Secondary | ICD-10-CM

## 2016-08-24 DIAGNOSIS — Z452 Encounter for adjustment and management of vascular access device: Secondary | ICD-10-CM

## 2016-08-24 DIAGNOSIS — C250 Malignant neoplasm of head of pancreas: Secondary | ICD-10-CM | POA: Diagnosis not present

## 2016-08-24 DIAGNOSIS — Z95828 Presence of other vascular implants and grafts: Secondary | ICD-10-CM

## 2016-08-24 MED ORDER — SODIUM CHLORIDE 0.9 % IJ SOLN
10.0000 mL | INTRAMUSCULAR | Status: DC | PRN
  Administered 2016-08-24: 10 mL via INTRAVENOUS
  Filled 2016-08-24: qty 10

## 2016-08-24 MED ORDER — PEGFILGRASTIM INJECTION 6 MG/0.6ML ~~LOC~~
6.0000 mg | PREFILLED_SYRINGE | Freq: Once | SUBCUTANEOUS | Status: AC
  Administered 2016-08-24: 6 mg via SUBCUTANEOUS
  Filled 2016-08-24: qty 0.6

## 2016-08-24 MED ORDER — HEPARIN SOD (PORK) LOCK FLUSH 100 UNIT/ML IV SOLN
500.0000 [IU] | Freq: Once | INTRAVENOUS | Status: AC | PRN
  Administered 2016-08-24: 500 [IU] via INTRAVENOUS
  Filled 2016-08-24: qty 5

## 2016-08-24 NOTE — Patient Instructions (Signed)
Implanted Port Home Guide An implanted port is a type of central line that is placed under the skin. Central lines are used to provide IV access when treatment or nutrition needs to be given through a person's veins. Implanted ports are used for long-term IV access. An implanted port may be placed because:   You need IV medicine that would be irritating to the small veins in your hands or arms.   You need long-term IV medicines, such as antibiotics.   You need IV nutrition for a long period.   You need frequent blood draws for lab tests.   You need dialysis.  Implanted ports are usually placed in the chest area, but they can also be placed in the upper arm, the abdomen, or the leg. An implanted port has two main parts:   Reservoir. The reservoir is round and will appear as a small, raised area under your skin. The reservoir is the part where a needle is inserted to give medicines or draw blood.   Catheter. The catheter is a thin, flexible tube that extends from the reservoir. The catheter is placed into a large vein. Medicine that is inserted into the reservoir goes into the catheter and then into the vein.  HOW WILL I CARE FOR MY INCISION SITE? Do not get the incision site wet. Bathe or shower as directed by your health care provider.  HOW IS MY PORT ACCESSED? Special steps must be taken to access the port:   Before the port is accessed, a numbing cream can be placed on the skin. This helps numb the skin over the port site.   Your health care provider uses a sterile technique to access the port.  Your health care provider must put on a mask and sterile gloves.  The skin over your port is cleaned carefully with an antiseptic and allowed to dry.  The port is gently pinched between sterile gloves, and a needle is inserted into the port.  Only "non-coring" port needles should be used to access the port. Once the port is accessed, a blood return should be checked. This helps  ensure that the port is in the vein and is not clogged.   If your port needs to remain accessed for a constant infusion, a clear (transparent) bandage will be placed over the needle site. The bandage and needle will need to be changed every week, or as directed by your health care provider.   Keep the bandage covering the needle clean and dry. Do not get it wet. Follow your health care provider's instructions on how to take a shower or bath while the port is accessed.   If your port does not need to stay accessed, no bandage is needed over the port.  WHAT IS FLUSHING? Flushing helps keep the port from getting clogged. Follow your health care provider's instructions on how and when to flush the port. Ports are usually flushed with saline solution or a medicine called heparin. The need for flushing will depend on how the port is used.   If the port is used for intermittent medicines or blood draws, the port will need to be flushed:   After medicines have been given.   After blood has been drawn.   As part of routine maintenance.   If a constant infusion is running, the port may not need to be flushed.  HOW LONG WILL MY PORT STAY IMPLANTED? The port can stay in for as long as your health care   provider thinks it is needed. When it is time for the port to come out, surgery will be done to remove it. The procedure is similar to the one performed when the port was put in.  WHEN SHOULD I SEEK IMMEDIATE MEDICAL CARE? When you have an implanted port, you should seek immediate medical care if:   You notice a bad smell coming from the incision site.   You have swelling, redness, or drainage at the incision site.   You have more swelling or pain at the port site or the surrounding area.   You have a fever that is not controlled with medicine.   This information is not intended to replace advice given to you by your health care provider. Make sure you discuss any questions you have with  your health care provider.   Document Released: 10/08/2005 Document Revised: 07/29/2013 Document Reviewed: 06/15/2013 Elsevier Interactive Patient Education 2016 [employer] Pegfilgrastim injection What is this medicine? PEGFILGRASTIM (PEG fil gra stim) is a long-acting granulocyte colony-stimulating factor that stimulates the growth of neutrophils, a type of white blood cell important in the body's fight against infection. It is used to reduce the incidence of fever and infection in patients with certain types of cancer who are receiving chemotherapy that affects the bone marrow, and to increase survival after being exposed to high doses of radiation. This medicine may be used for other purposes; ask your health care provider or pharmacist if you have questions. What should I tell my health care provider before I take this medicine? They need to know if you have any of these conditions: -kidney disease -latex allergy -ongoing radiation therapy -sickle cell disease -skin reactions to acrylic adhesives (On-Body Injector only) -an unusual or allergic reaction to pegfilgrastim, filgrastim, other medicines, foods, dyes, or preservatives -pregnant or trying to get pregnant -breast-feeding How should I use this medicine? This medicine is for injection under the skin. If you get this medicine at home, you will be taught how to prepare and give the pre-filled syringe or how to use the On-body Injector. Refer to the patient Instructions for Use for detailed instructions. Use exactly as directed. Take your medicine at regular intervals. Do not take your medicine more often than directed. It is important that you put your used needles and syringes in a special sharps container. Do not put them in a trash can. If you do not have a sharps container, call your pharmacist or healthcare provider to get one. Talk to your pediatrician regarding the use of this medicine in children. While this drug may be  prescribed for selected conditions, precautions do apply. Overdosage: If you think you have taken too much of this medicine contact a poison control center or emergency room at once. NOTE: This medicine is only for you. Do not share this medicine with others. What if I miss a dose? It is important not to miss your dose. Call your doctor or health care professional if you miss your dose. If you miss a dose due to an On-body Injector failure or leakage, a new dose should be administered as soon as possible using a single prefilled syringe for manual use. What may interact with this medicine? Interactions have not been studied. Give your health care provider a list of all the medicines, herbs, non-prescription drugs, or dietary supplements you use. Also tell them if you smoke, drink alcohol, or use illegal drugs. Some items may interact with your medicine. This list may not describe all possible   interactions. Give your health care provider a list of all the medicines, herbs, non-prescription drugs, or dietary supplements you use. Also tell them if you smoke, drink alcohol, or use illegal drugs. Some items may interact with your medicine. What should I watch for while using this medicine? You may need blood work done while you are taking this medicine. If you are going to need a MRI, CT scan, or other procedure, tell your doctor that you are using this medicine (On-Body Injector only). What side effects may I notice from receiving this medicine? Side effects that you should report to your doctor or health care professional as soon as possible: -allergic reactions like skin rash, itching or hives, swelling of the face, lips, or tongue -dizziness -fever -pain, redness, or irritation at site where injected -pinpoint red spots on the skin -red or dark-brown urine -shortness of breath or breathing problems -stomach or side pain, or pain at the shoulder -swelling -tiredness -trouble passing urine or  change in the amount of urine Side effects that usually do not require medical attention (report to your doctor or health care professional if they continue or are bothersome): -bone pain -muscle pain This list may not describe all possible side effects. Call your doctor for medical advice about side effects. You may report side effects to FDA at 1-800-FDA-1088. Where should I keep my medicine? Keep out of the reach of children. Store pre-filled syringes in a refrigerator between 2 and 8 degrees C (36 and 46 degrees F). Do not freeze. Keep in carton to protect from light. Throw away this medicine if it is left out of the refrigerator for more than 48 hours. Throw away any unused medicine after the expiration date. NOTE: This sheet is a summary. It may not cover all possible information. If you have questions about this medicine, talk to your doctor, pharmacist, or health care provider.    2016, Elsevier/Gold Standard. (2014-10-28 14:30:14)  

## 2016-08-25 ENCOUNTER — Ambulatory Visit: Payer: Medicare Other

## 2016-09-12 ENCOUNTER — Other Ambulatory Visit: Payer: Self-pay | Admitting: Family Medicine

## 2016-09-19 ENCOUNTER — Telehealth: Payer: Self-pay | Admitting: Oncology

## 2016-09-19 ENCOUNTER — Ambulatory Visit (HOSPITAL_BASED_OUTPATIENT_CLINIC_OR_DEPARTMENT_OTHER): Payer: Medicare Other

## 2016-09-19 ENCOUNTER — Telehealth: Payer: Self-pay | Admitting: *Deleted

## 2016-09-19 ENCOUNTER — Ambulatory Visit (HOSPITAL_BASED_OUTPATIENT_CLINIC_OR_DEPARTMENT_OTHER): Payer: Medicare Other | Admitting: Oncology

## 2016-09-19 ENCOUNTER — Other Ambulatory Visit (HOSPITAL_BASED_OUTPATIENT_CLINIC_OR_DEPARTMENT_OTHER): Payer: Medicare Other

## 2016-09-19 VITALS — BP 162/68 | HR 64 | Temp 98.3°F | Resp 16 | Ht 72.0 in | Wt 163.0 lb

## 2016-09-19 DIAGNOSIS — C250 Malignant neoplasm of head of pancreas: Secondary | ICD-10-CM

## 2016-09-19 DIAGNOSIS — G620 Drug-induced polyneuropathy: Secondary | ICD-10-CM | POA: Diagnosis not present

## 2016-09-19 DIAGNOSIS — R740 Nonspecific elevation of levels of transaminase and lactic acid dehydrogenase [LDH]: Secondary | ICD-10-CM

## 2016-09-19 DIAGNOSIS — C787 Secondary malignant neoplasm of liver and intrahepatic bile duct: Secondary | ICD-10-CM | POA: Diagnosis not present

## 2016-09-19 DIAGNOSIS — Z5111 Encounter for antineoplastic chemotherapy: Secondary | ICD-10-CM | POA: Diagnosis not present

## 2016-09-19 DIAGNOSIS — Z95828 Presence of other vascular implants and grafts: Secondary | ICD-10-CM

## 2016-09-19 DIAGNOSIS — E119 Type 2 diabetes mellitus without complications: Secondary | ICD-10-CM | POA: Diagnosis not present

## 2016-09-19 DIAGNOSIS — Z452 Encounter for adjustment and management of vascular access device: Secondary | ICD-10-CM | POA: Diagnosis not present

## 2016-09-19 LAB — COMPREHENSIVE METABOLIC PANEL
ALBUMIN: 3.2 g/dL — AB (ref 3.5–5.0)
ALK PHOS: 653 U/L — AB (ref 40–150)
ALT: 107 U/L — ABNORMAL HIGH (ref 0–55)
AST: 88 U/L — ABNORMAL HIGH (ref 5–34)
Anion Gap: 8 mEq/L (ref 3–11)
BILIRUBIN TOTAL: 0.83 mg/dL (ref 0.20–1.20)
BUN: 12.5 mg/dL (ref 7.0–26.0)
CO2: 24 meq/L (ref 22–29)
Calcium: 9 mg/dL (ref 8.4–10.4)
Chloride: 104 mEq/L (ref 98–109)
Creatinine: 0.9 mg/dL (ref 0.7–1.3)
EGFR: 86 mL/min/{1.73_m2} — AB (ref >90)
Glucose: 309 mg/dl — ABNORMAL HIGH (ref 70–140)
POTASSIUM: 3.7 meq/L (ref 3.5–5.1)
SODIUM: 136 meq/L (ref 136–145)
TOTAL PROTEIN: 6.4 g/dL (ref 6.4–8.3)

## 2016-09-19 LAB — CBC WITH DIFFERENTIAL/PLATELET
BASO%: 0.8 % (ref 0.0–2.0)
Basophils Absolute: 0 10*3/uL (ref 0.0–0.1)
EOS%: 2.7 % (ref 0.0–7.0)
Eosinophils Absolute: 0.1 10*3/uL (ref 0.0–0.5)
HCT: 33 % — ABNORMAL LOW (ref 38.4–49.9)
HEMOGLOBIN: 10.7 g/dL — AB (ref 13.0–17.1)
LYMPH%: 13.7 % — AB (ref 14.0–49.0)
MCH: 30.6 pg (ref 27.2–33.4)
MCHC: 32.5 g/dL (ref 32.0–36.0)
MCV: 94.3 fL (ref 79.3–98.0)
MONO#: 0.5 10*3/uL (ref 0.1–0.9)
MONO%: 10.7 % (ref 0.0–14.0)
NEUT%: 72.1 % (ref 39.0–75.0)
NEUTROS ABS: 3.5 10*3/uL (ref 1.5–6.5)
Platelets: 126 10*3/uL — ABNORMAL LOW (ref 140–400)
RBC: 3.5 10*6/uL — AB (ref 4.20–5.82)
RDW: 14.5 % (ref 11.0–14.6)
WBC: 4.8 10*3/uL (ref 4.0–10.3)
lymph#: 0.7 10*3/uL — ABNORMAL LOW (ref 0.9–3.3)

## 2016-09-19 MED ORDER — FOSAPREPITANT DIMEGLUMINE INJECTION 150 MG
Freq: Once | INTRAVENOUS | Status: AC
  Administered 2016-09-19: 10:00:00 via INTRAVENOUS
  Filled 2016-09-19: qty 5

## 2016-09-19 MED ORDER — PALONOSETRON HCL INJECTION 0.25 MG/5ML
INTRAVENOUS | Status: AC
  Filled 2016-09-19: qty 5

## 2016-09-19 MED ORDER — DEXTROSE 5 % IV SOLN: Freq: Once | INTRAVENOUS | Status: DC

## 2016-09-19 MED ORDER — PALONOSETRON HCL INJECTION 0.25 MG/5ML
0.2500 mg | Freq: Once | INTRAVENOUS | Status: AC
  Administered 2016-09-19: 0.25 mg via INTRAVENOUS

## 2016-09-19 MED ORDER — SODIUM CHLORIDE 0.9 % IJ SOLN
10.0000 mL | INTRAMUSCULAR | Status: DC | PRN
  Administered 2016-09-19: 10 mL via INTRAVENOUS
  Filled 2016-09-19: qty 10

## 2016-09-19 MED ORDER — SODIUM CHLORIDE 0.9 % IV SOLN
2400.0000 mg/m2 | INTRAVENOUS | Status: DC
  Administered 2016-09-19: 4550 mg via INTRAVENOUS
  Filled 2016-09-19: qty 91

## 2016-09-19 MED ORDER — SODIUM CHLORIDE 0.9 % IV SOLN
Freq: Once | INTRAVENOUS | Status: AC
  Administered 2016-09-19: 10:00:00 via INTRAVENOUS

## 2016-09-19 MED ORDER — IRINOTECAN HCL CHEMO INJECTION 100 MG/5ML
180.0000 mg/m2 | Freq: Once | INTRAVENOUS | Status: AC
  Administered 2016-09-19: 340 mg via INTRAVENOUS
  Filled 2016-09-19: qty 15

## 2016-09-19 MED ORDER — SODIUM CHLORIDE 0.9 % IV SOLN
400.0000 mg/m2 | Freq: Once | INTRAVENOUS | Status: AC
  Administered 2016-09-19: 756 mg via INTRAVENOUS
  Filled 2016-09-19: qty 37.8

## 2016-09-19 MED ORDER — LEUCOVORIN CALCIUM INJECTION 350 MG: 400.0000 mg/m2 | Freq: Once | INTRAVENOUS | Status: DC

## 2016-09-19 NOTE — Telephone Encounter (Signed)
Message sent to chemo scheduler to be added. Appointments scheduled per 09/19/16 los. Patient will receive copy of AVS report with appointment schedule from [facility] in Infusion, after treatment.

## 2016-09-19 NOTE — Patient Instructions (Signed)
[place] Discharge Instructions for Patients Receiving Chemotherapy  Today you received the following chemotherapy agents Leucovorin and Irinotecan and Adrucil. To help prevent nausea and vomiting after your treatment, we encourage you to take your nausea medication as directed.  No zofran for 3 days.   If you develop nausea and vomiting that is not controlled by your nausea medication, call the clinic.   BELOW ARE SYMPTOMS THAT SHOULD BE REPORTED IMMEDIATELY:  *FEVER GREATER THAN 100.5 F  *CHILLS WITH OR WITHOUT FEVER  NAUSEA AND VOMITING THAT IS NOT CONTROLLED WITH YOUR NAUSEA MEDICATION  *UNUSUAL SHORTNESS OF BREATH  *UNUSUAL BRUISING OR BLEEDING  TENDERNESS IN MOUTH AND THROAT WITH OR WITHOUT PRESENCE OF ULCERS  *URINARY PROBLEMS  *BOWEL PROBLEMS  UNUSUAL RASH Items with * indicate a potential emergency and should be followed up as soon as possible.  Feel free to call the clinic you have any questions or concerns. The clinic phone number is [phone].  Please show the [place] at check-in to the Emergency Department and triage nurse.

## 2016-09-19 NOTE — Progress Notes (Signed)
OK to treat with elevated liver enzymes today per MD [name]

## 2016-09-19 NOTE — Telephone Encounter (Signed)
Per LOS I have scheduled appts and notified the scheduler 

## 2016-09-19 NOTE — Progress Notes (Signed)
  [facility] OFFICE PROGRESS NOTE   Diagnosis: Pancreas cancer  INTERVAL HISTORY:   Mr. Majewski returns as scheduled. He completed another cycle of FOLFIRI on 08/22/2016. He reports fatigue for a few days after the Neulasta injection. No fever or pain. Good appetite and energy level. No diarrhea. The neuropathy symptoms have improved.  Objective:  Vital signs in last 24 hours:  There were no vitals taken for this visit.    HEENT: No thrush or ulcers Resp: Lungs clear bilaterally Cardio: Regular rate and rhythm GI: No hepatomegaly, nontender, no mass Vascular: No leg edema  Skin: Palms without erythema   Portacath/PICC-without erythema  Lab Results:  Lab Results  Component Value Date   WBC 4.8 09/19/2016   HGB 10.7 (L) 09/19/2016   HCT 33.0 (L) 09/19/2016   MCV 94.3 09/19/2016   PLT 126 (L) 09/19/2016   NEUTROABS 3.5 09/19/2016     Medications: I have reviewed the patient's current medications.  Assessment/Plan: 1. 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 Upper endoscopy 11/18/2015 confirmed duodenal invasion/obstruction with a biopsy confirming adenocarcinoma ? Placement of a duodenal stent 11/22/2015 ? MRI abdomen 11/15/2015 revealed a pancreas head mass and liver metastases ? Cycle 1 FOLFIRINOX 12/06/2015 ? Cycle 2 FOLFIRINOX 12/21/2015 ? Cycle 3 FOLFIRINOX 01/04/2016 ? CT abdomen/pelvis 01/19/2016 showed improvement in the pancreatic head mass and liver metastases. ? Cycle 4 FOLFIRINOX 02/01/2016 ? Cycle 5 FOLFIRINOX 02/15/2016 ? Cycle 6 FOLFIRINOX 02/29/2016 ? Cycle 7 FOLFIRINOX 03/14/2016 ? Cycle 8 FOLFIRINOX 03/28/2016 ? CT abdomen/pelvis 04/09/2016-improvement in pancreas head mass and liver metastases, no new metastatic disease ? Chemotherapy switch to FOLFIRI every 3 weeks beginning 04/11/2016 ? CT 07/30/2016-improvement in liver metastases, stable pancreas mass   ? FOLFIRI continued on a 3 week schedule  2. Diabetes  3. History of Anorexia/weight loss secondary to #1  4. Obstructive jaundice secondary to #1-status post placement of a percutaneous internal/external biliary drain 11/25/2015; biliary drainage catheter capped 12/02/2015; biliary drain internalized 12/13/2015  5. Port-A-Cath placement 11/30/2015 interventional radiology  6. Admission 01/17/2016 with a high fever and rigors-no apparent source for infection upon review of his history and examination, blood cultures positive for gram-positive cocci--viridans streptococcus--completed 2 weeks of IV ceftriaxone; no endocarditis on TTE.  7. Oxaliplatin neuropathy  8. Elevated transaminases, alkaline phosphatase, and bilirubin 06/13/2016.06/19/2016 bilirubin improved to normal range; liver enzymes remain elevated.     Disposition:  He appears stable. The plan is to continue FOLFIRI. We will discontinue Neulasta with this cycle secondary to the malaise he attributes to receiving Neulasta. He knows to contact us for a fever.  Mr. Steward return for an office visit and chemotherapy on 10/10/2016. We will plan for a restaging CT in January. The CA 19-9 has been slightly higher over the past few months.  Betsy Coder, MD  09/19/2016  9:30 AM

## 2016-09-20 LAB — CANCER ANTIGEN 19-9: CAN 19-9: 216 U/mL — AB (ref 0–35)

## 2016-09-21 ENCOUNTER — Ambulatory Visit: Payer: Medicare Other

## 2016-09-21 ENCOUNTER — Ambulatory Visit (HOSPITAL_BASED_OUTPATIENT_CLINIC_OR_DEPARTMENT_OTHER): Payer: Medicare Other

## 2016-09-21 ENCOUNTER — Telehealth: Payer: Self-pay | Admitting: *Deleted

## 2016-09-21 VITALS — BP 169/82 | HR 61 | Temp 98.4°F | Resp 18

## 2016-09-21 DIAGNOSIS — Z452 Encounter for adjustment and management of vascular access device: Secondary | ICD-10-CM

## 2016-09-21 DIAGNOSIS — C250 Malignant neoplasm of head of pancreas: Secondary | ICD-10-CM | POA: Diagnosis not present

## 2016-09-21 MED ORDER — HEPARIN SOD (PORK) LOCK FLUSH 100 UNIT/ML IV SOLN
500.0000 [IU] | Freq: Once | INTRAVENOUS | Status: AC | PRN
  Administered 2016-09-21: 500 [IU]
  Filled 2016-09-21: qty 5

## 2016-09-21 MED ORDER — SODIUM CHLORIDE 0.9% FLUSH
10.0000 mL | INTRAVENOUS | Status: DC | PRN
  Administered 2016-09-21: 10 mL
  Filled 2016-09-21: qty 10

## 2016-09-21 NOTE — Patient Instructions (Signed)

## 2016-09-21 NOTE — Telephone Encounter (Signed)
-----   Message from Jacob Pier, MD sent at 09/20/2016  5:20 PM EST ----- Please call patient, ca19-9 is slightly higher, will repeat with next treatment and if higher consider checking CT, only mildly higher and sill much lower than at presentation

## 2016-09-24 ENCOUNTER — Telehealth: Payer: Self-pay | Admitting: *Deleted

## 2016-09-24 NOTE — Telephone Encounter (Signed)
-----   Message from Ladell Pier, MD sent at 09/20/2016  5:20 PM EST ----- Please call patient, ca19-9 is slightly higher, will repeat with next treatment and if higher consider checking CT, only mildly higher and sill much lower than at presentation

## 2016-09-24 NOTE — Telephone Encounter (Signed)
Call placed to patient's home to inform him of instructions from Dr. [name] regarding CA19-9 and wife answered telephone to say, "this better be important, we are eating supper" and hung up the phone.  Will attempt to call pt again tomorrow.

## 2016-09-25 ENCOUNTER — Telehealth: Payer: Self-pay

## 2016-09-25 ENCOUNTER — Telehealth: Payer: Self-pay | Admitting: *Deleted

## 2016-09-25 NOTE — Telephone Encounter (Signed)
-----   Message from Ladell Pier, MD sent at 09/20/2016  5:20 PM EST ----- Please call patient, ca19-9 is slightly higher, will repeat with next treatment and if higher consider checking CT, only mildly higher and sill much lower than at presentation

## 2016-09-25 NOTE — Telephone Encounter (Signed)
Call returned from patient's wife and patient's wife informed per Dr. [name] that ca 19-9 is slightly higher, will repeat with next treatment and if higher consider checking CT, only mildly higher and still much lower than at presentation.  Patient's wife has no questions or concerns at this time.

## 2016-09-25 NOTE — Telephone Encounter (Signed)
Attempted to call pt, no answer. Left message for pt to call back regarding lab results

## 2016-10-03 ENCOUNTER — Telehealth: Payer: Self-pay | Admitting: *Deleted

## 2016-10-03 ENCOUNTER — Ambulatory Visit (HOSPITAL_BASED_OUTPATIENT_CLINIC_OR_DEPARTMENT_OTHER): Payer: Medicare Other

## 2016-10-03 DIAGNOSIS — C250 Malignant neoplasm of head of pancreas: Secondary | ICD-10-CM

## 2016-10-03 LAB — CBC WITH DIFFERENTIAL/PLATELET
BASO%: 0.7 % (ref 0.0–2.0)
BASOS ABS: 0 10*3/uL (ref 0.0–0.1)
EOS ABS: 0.2 10*3/uL (ref 0.0–0.5)
EOS%: 3.3 % (ref 0.0–7.0)
HEMATOCRIT: 33.7 % — AB (ref 38.4–49.9)
HEMOGLOBIN: 10.9 g/dL — AB (ref 13.0–17.1)
LYMPH#: 0.9 10*3/uL (ref 0.9–3.3)
LYMPH%: 18.7 % (ref 14.0–49.0)
MCH: 30.2 pg (ref 27.2–33.4)
MCHC: 32.3 g/dL (ref 32.0–36.0)
MCV: 93.4 fL (ref 79.3–98.0)
MONO#: 0.4 10*3/uL (ref 0.1–0.9)
MONO%: 8.8 % (ref 0.0–14.0)
NEUT#: 3.4 10*3/uL (ref 1.5–6.5)
NEUT%: 68.5 % (ref 39.0–75.0)
Platelets: 145 10*3/uL (ref 140–400)
RBC: 3.61 10*6/uL — ABNORMAL LOW (ref 4.20–5.82)
RDW: 14.8 % — AB (ref 11.0–14.6)
WBC: 5 10*3/uL (ref 4.0–10.3)

## 2016-10-03 LAB — COMPREHENSIVE METABOLIC PANEL
ALBUMIN: 3.1 g/dL — AB (ref 3.5–5.0)
ALK PHOS: 804 U/L — AB (ref 40–150)
ALT: 192 U/L — ABNORMAL HIGH (ref 0–55)
AST: 170 U/L — AB (ref 5–34)
Anion Gap: 8 mEq/L (ref 3–11)
BILIRUBIN TOTAL: 1.11 mg/dL (ref 0.20–1.20)
BUN: 15.9 mg/dL (ref 7.0–26.0)
CALCIUM: 9.4 mg/dL (ref 8.4–10.4)
CO2: 27 mEq/L (ref 22–29)
Chloride: 105 mEq/L (ref 98–109)
Creatinine: 1 mg/dL (ref 0.7–1.3)
EGFR: 78 mL/min/{1.73_m2} — ABNORMAL LOW (ref >90)
Glucose: 191 mg/dl — ABNORMAL HIGH (ref 70–140)
POTASSIUM: 4 meq/L (ref 3.5–5.1)
Sodium: 140 mEq/L (ref 136–145)
Total Protein: 6.5 g/dL (ref 6.4–8.3)

## 2016-10-03 NOTE — Telephone Encounter (Signed)
Per Dr. [name]: Check CMET today. Called pt with lab appt, he is on his way to clinic. Instructed him to wait for results.

## 2016-10-03 NOTE — Telephone Encounter (Signed)
Message from pt's wife reporting itching "all over like ants are crawling on him." Benadryl helps itching. Returned call, she reports he is applying lotion to prevent dry skin. Has tried Claritin without relief. He does not have a rash or redness on skin.  Instructed her to continue Benadryl PRN. Will review with Dr. [name].

## 2016-10-04 ENCOUNTER — Telehealth: Payer: Self-pay | Admitting: *Deleted

## 2016-10-04 DIAGNOSIS — C250 Malignant neoplasm of head of pancreas: Secondary | ICD-10-CM

## 2016-10-04 LAB — CANCER ANTIGEN 19-9: CAN 19-9: 245 U/mL — AB (ref 0–35)

## 2016-10-04 NOTE — Telephone Encounter (Signed)
Informed wife of lab result as noted below, per Dr. [name]. Instructed her to call office if he develops rash, fever or worsening pruritis. She voiced understanding. Noted 12/20 lab orders were released instead of the CMET ordered 12/13. CBC credited- unable to credit tumor marker as it was sent to an outside lab.

## 2016-10-04 NOTE — Telephone Encounter (Signed)
-----   Message from Ladell Pier, MD sent at 10/03/2016  7:35 PM EST ----- Please call patient, bilirubin is ok, pruritis could be related to elevated liver enzymes, call for fever, rash , or increased pruritis  Looks like they released labs scheduled for next chemo. Ask lab to reorder

## 2016-10-10 ENCOUNTER — Ambulatory Visit: Payer: Medicare Other

## 2016-10-10 ENCOUNTER — Other Ambulatory Visit (HOSPITAL_BASED_OUTPATIENT_CLINIC_OR_DEPARTMENT_OTHER): Payer: Medicare Other

## 2016-10-10 ENCOUNTER — Ambulatory Visit (HOSPITAL_BASED_OUTPATIENT_CLINIC_OR_DEPARTMENT_OTHER): Payer: Medicare Other

## 2016-10-10 ENCOUNTER — Ambulatory Visit (HOSPITAL_BASED_OUTPATIENT_CLINIC_OR_DEPARTMENT_OTHER): Payer: Medicare Other | Admitting: Nurse Practitioner

## 2016-10-10 ENCOUNTER — Telehealth: Payer: Self-pay | Admitting: Oncology

## 2016-10-10 VITALS — BP 135/75 | HR 68 | Temp 99.2°F | Resp 18 | Wt 160.3 lb

## 2016-10-10 DIAGNOSIS — Z452 Encounter for adjustment and management of vascular access device: Secondary | ICD-10-CM

## 2016-10-10 DIAGNOSIS — Z95828 Presence of other vascular implants and grafts: Secondary | ICD-10-CM

## 2016-10-10 DIAGNOSIS — C250 Malignant neoplasm of head of pancreas: Secondary | ICD-10-CM | POA: Diagnosis not present

## 2016-10-10 DIAGNOSIS — G620 Drug-induced polyneuropathy: Secondary | ICD-10-CM

## 2016-10-10 DIAGNOSIS — E119 Type 2 diabetes mellitus without complications: Secondary | ICD-10-CM | POA: Diagnosis not present

## 2016-10-10 LAB — CBC WITH DIFFERENTIAL/PLATELET
BASO%: 0.5 % (ref 0.0–2.0)
BASOS ABS: 0 10*3/uL (ref 0.0–0.1)
EOS ABS: 0.1 10*3/uL (ref 0.0–0.5)
EOS%: 3.1 % (ref 0.0–7.0)
HCT: 32.5 % — ABNORMAL LOW (ref 38.4–49.9)
HGB: 10.9 g/dL — ABNORMAL LOW (ref 13.0–17.1)
LYMPH%: 18.7 % (ref 14.0–49.0)
MCH: 31.2 pg (ref 27.2–33.4)
MCHC: 33.5 g/dL (ref 32.0–36.0)
MCV: 93.1 fL (ref 79.3–98.0)
MONO#: 0.6 10*3/uL (ref 0.1–0.9)
MONO%: 15.1 % — AB (ref 0.0–14.0)
NEUT%: 62.6 % (ref 39.0–75.0)
NEUTROS ABS: 2.4 10*3/uL (ref 1.5–6.5)
Platelets: 143 10*3/uL (ref 140–400)
RBC: 3.49 10*6/uL — AB (ref 4.20–5.82)
RDW: 14.6 % (ref 11.0–14.6)
WBC: 3.9 10*3/uL — AB (ref 4.0–10.3)
lymph#: 0.7 10*3/uL — ABNORMAL LOW (ref 0.9–3.3)

## 2016-10-10 LAB — COMPREHENSIVE METABOLIC PANEL
ALT: 204 U/L — ABNORMAL HIGH (ref 0–55)
AST: 173 U/L — AB (ref 5–34)
Albumin: 3.1 g/dL — ABNORMAL LOW (ref 3.5–5.0)
Alkaline Phosphatase: 1064 U/L — ABNORMAL HIGH (ref 40–150)
Anion Gap: 7 mEq/L (ref 3–11)
BUN: 11.5 mg/dL (ref 7.0–26.0)
CO2: 27 meq/L (ref 22–29)
Calcium: 9 mg/dL (ref 8.4–10.4)
Chloride: 102 mEq/L (ref 98–109)
Creatinine: 0.8 mg/dL (ref 0.7–1.3)
EGFR: 90 mL/min/{1.73_m2} — ABNORMAL LOW (ref >90)
GLUCOSE: 219 mg/dL — AB (ref 70–140)
POTASSIUM: 3.7 meq/L (ref 3.5–5.1)
SODIUM: 136 meq/L (ref 136–145)
Total Bilirubin: 2.18 mg/dL — ABNORMAL HIGH (ref 0.20–1.20)
Total Protein: 6.6 g/dL (ref 6.4–8.3)

## 2016-10-10 MED ORDER — HEPARIN SOD (PORK) LOCK FLUSH 100 UNIT/ML IV SOLN
500.0000 [IU] | Freq: Once | INTRAVENOUS | Status: AC | PRN
  Administered 2016-10-10: 500 [IU] via INTRAVENOUS
  Filled 2016-10-10: qty 5

## 2016-10-10 MED ORDER — SODIUM CHLORIDE 0.9 % IJ SOLN
10.0000 mL | INTRAMUSCULAR | Status: DC | PRN
  Administered 2016-10-10: 10 mL via INTRAVENOUS
  Filled 2016-10-10: qty 10

## 2016-10-10 NOTE — Progress Notes (Signed)
[name] OFFICE PROGRESS NOTE   Diagnosis:  Pancreas cancer  INTERVAL HISTORY:   Jacob Hardin returns as scheduled. He completed another cycle of FOLFIRI 09/19/2016. He denies nausea/vomiting. No mouth sores. No diarrhea. He denies abdominal pain. He has noted generalized pruritus, worse at nighttime, for the past 2 weeks. No rash. He has applied multiple lotions with no improvement. He has also tried Benadryl and is not sure if this is helping. No new medications, soaps, detergents.  Objective:  Vital signs in last 24 hours:  Blood pressure 135/75, pulse 68, temperature 99.2 F (37.3 C), temperature source Oral, resp. rate 18, weight 160 lb 4.8 oz (72.7 kg), SpO2 100 %.    HEENT: No thrush or ulcers. Sclera anicteric. Resp: Lungs clear bilaterally. Cardio: Regular rate and rhythm. GI: Abdomen soft and nontender. No hepatomegaly. No mass. Vascular: No leg edema. Skin: No rash. Port-A-Cath without erythema.    Lab Results:  Lab Results  Component Value Date   WBC 3.9 (L) 10/10/2016   HGB 10.9 (L) 10/10/2016   HCT 32.5 (L) 10/10/2016   MCV 93.1 10/10/2016   PLT 143 10/10/2016   NEUTROABS 2.4 10/10/2016    Imaging:  No results found.  Medications: I have reviewed the patient's current medications.  Assessment/Plan: 1. 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 Upper endoscopy 11/18/2015 confirmed duodenal invasion/obstruction with a biopsy confirming adenocarcinoma ? Placement of a duodenal stent 11/22/2015 ? MRI abdomen 11/15/2015 revealed a pancreas head mass and liver metastases ? Cycle 1 FOLFIRINOX 12/06/2015 ? Cycle 2 FOLFIRINOX 12/21/2015 ? Cycle 3 FOLFIRINOX 01/04/2016 ? CT abdomen/pelvis 01/19/2016 showed improvement in the pancreatic head mass and liver metastases. ? Cycle 4 FOLFIRINOX 02/01/2016 ? Cycle 5 FOLFIRINOX 02/15/2016 ? Cycle 6 FOLFIRINOX 02/29/2016 ? Cycle  7 FOLFIRINOX 03/14/2016 ? Cycle 8 FOLFIRINOX 03/28/2016 ? CT abdomen/pelvis 04/09/2016-improvement in pancreas head mass and liver metastases, no new metastatic disease ? Chemotherapy switch to FOLFIRI every 3 weeks beginning 04/11/2016 ? CT 07/30/2016-improvement in liver metastases, stable pancreas mass  ? FOLFIRI continued on a 3 week schedule  2. Diabetes  3. History of Anorexia/weight loss secondary to #1  4. Obstructive jaundice secondary to #1-status post placement of a percutaneous internal/external biliary drain 11/25/2015; biliary drainage catheter capped 12/02/2015; biliary drain internalized 12/13/2015  5. Port-A-Cath placement 11/30/2015 interventional radiology  6. Admission 01/17/2016 with a high fever and rigors-no apparent source for infection upon review of his history and examination, blood cultures positive for gram-positive cocci--viridans streptococcus--completed 2 weeks of IV ceftriaxone; no endocarditis on TTE.  7. Oxaliplatin neuropathy  8. Elevated transaminases, alkaline phosphatase, and bilirubin 06/13/2016.06/19/2016 bilirubin improved to normal range; liver enzymes remain elevated.  9.   Generalized pruritus, no rash 10/10/2016. Likely secondary to hyperbilirubinemia.     Disposition: [name] appears stable. He is currently on active treatment with FOLFIRI every 3 weeks. He has had generalized pruritus for the last approximate 2 weeks. Bilirubin is elevated on labs today with other liver function tests with persistent progressive elevation as well. He has a biliary stent.  We are holding today's chemotherapy and referring him for an abdominal ultrasound to evaluate for stent occlusion. If the stent is occluded we will refer him to Dr. [name].   He understands to contact the office or seek emergency evaluation with fever, other signs of infection. We will adjust his schedule pending the ultrasound result.  Plan reviewed with Dr.  Burr Medico in  Dr. Gearldine Shown absence. 25 minutes were spent face-to-face at today's visit with the majority of that time involved in counseling/coordination of care.        [name] ANP/GNP-BC   10/10/2016  9:22 AM

## 2016-10-10 NOTE — Telephone Encounter (Signed)
No LOS, per 10/10/16 date of service.

## 2016-10-10 NOTE — Progress Notes (Signed)
Called [facility] ultrasound dept, spoke with [place]ley. Pt unable to get ultrasound done today d/t having to be done NPO, pt ate at 7am. Per Malachy Mood, pt to come in at 730am tomorrow. Called central scheduling. Pt scheduled for 730, pt to be NPO after midnight and arrive at 715. Pt informed.

## 2016-10-11 ENCOUNTER — Telehealth: Payer: Self-pay

## 2016-10-11 ENCOUNTER — Ambulatory Visit (HOSPITAL_COMMUNITY)
Admission: RE | Admit: 2016-10-11 | Discharge: 2016-10-11 | Disposition: A | Discharge status: Home / Self Care | Payer: Medicare Other | Source: Ambulatory Visit | Attending: Nurse Practitioner | Admitting: Nurse Practitioner

## 2016-10-11 DIAGNOSIS — R509 Fever, unspecified: Secondary | ICD-10-CM | POA: Diagnosis not present

## 2016-10-11 DIAGNOSIS — T85898A Other specified complication of other internal prosthetic devices, implants and grafts, initial encounter: Secondary | ICD-10-CM | POA: Diagnosis not present

## 2016-10-11 DIAGNOSIS — C250 Malignant neoplasm of head of pancreas: Secondary | ICD-10-CM

## 2016-10-11 DIAGNOSIS — K869 Disease of pancreas, unspecified: Secondary | ICD-10-CM | POA: Insufficient documentation

## 2016-10-11 DIAGNOSIS — K828 Other specified diseases of gallbladder: Secondary | ICD-10-CM | POA: Insufficient documentation

## 2016-10-11 DIAGNOSIS — R938 Abnormal findings on diagnostic imaging of other specified body structures: Secondary | ICD-10-CM

## 2016-10-11 LAB — CANCER ANTIGEN 19-9: CAN 19-9: 283 U/mL — AB (ref 0–35)

## 2016-10-11 NOTE — Telephone Encounter (Signed)
Called Dr.Hung's office to get pt scheduled for appt for possible stent obstruction. Spoke with Caryl Pina, appt made for 10/17/16 at 845am. Pt made aware. Notes and [place] result faxed

## 2016-10-12 ENCOUNTER — Other Ambulatory Visit: Payer: Self-pay | Admitting: Nurse Practitioner

## 2016-10-12 ENCOUNTER — Inpatient Hospital Stay (HOSPITAL_COMMUNITY)
Admission: EM | Admit: 2016-10-12 | Discharge: 2016-10-20 | DRG: 919 | Disposition: A | Discharge status: Home Health | Payer: Medicare Other | Attending: Internal Medicine | Admitting: Internal Medicine

## 2016-10-12 ENCOUNTER — Telehealth: Payer: Self-pay | Admitting: *Deleted

## 2016-10-12 ENCOUNTER — Encounter (HOSPITAL_COMMUNITY): Payer: Self-pay | Admitting: *Deleted

## 2016-10-12 ENCOUNTER — Other Ambulatory Visit (HOSPITAL_COMMUNITY): Payer: Self-pay

## 2016-10-12 ENCOUNTER — Other Ambulatory Visit: Payer: Self-pay

## 2016-10-12 ENCOUNTER — Emergency Department (HOSPITAL_COMMUNITY): Payer: Medicare Other

## 2016-10-12 ENCOUNTER — Ambulatory Visit: Payer: Medicare Other

## 2016-10-12 DIAGNOSIS — R5081 Fever presenting with conditions classified elsewhere: Secondary | ICD-10-CM | POA: Diagnosis present

## 2016-10-12 DIAGNOSIS — K219 Gastro-esophageal reflux disease without esophagitis: Secondary | ICD-10-CM | POA: Diagnosis present

## 2016-10-12 DIAGNOSIS — Y838 Other surgical procedures as the cause of abnormal reaction of the patient, or of later complication, without mention of misadventure at the time of the procedure: Secondary | ICD-10-CM | POA: Diagnosis present

## 2016-10-12 DIAGNOSIS — Z85828 Personal history of other malignant neoplasm of skin: Secondary | ICD-10-CM | POA: Diagnosis not present

## 2016-10-12 DIAGNOSIS — C250 Malignant neoplasm of head of pancreas: Secondary | ICD-10-CM | POA: Diagnosis present

## 2016-10-12 DIAGNOSIS — R509 Fever, unspecified: Secondary | ICD-10-CM

## 2016-10-12 DIAGNOSIS — A419 Sepsis, unspecified organism: Secondary | ICD-10-CM | POA: Diagnosis present

## 2016-10-12 DIAGNOSIS — T85898A Other specified complication of other internal prosthetic devices, implants and grafts, initial encounter: Principal | ICD-10-CM | POA: Diagnosis present

## 2016-10-12 DIAGNOSIS — C784 Secondary malignant neoplasm of small intestine: Secondary | ICD-10-CM | POA: Diagnosis present

## 2016-10-12 DIAGNOSIS — Z8249 Family history of ischemic heart disease and other diseases of the circulatory system: Secondary | ICD-10-CM | POA: Diagnosis not present

## 2016-10-12 DIAGNOSIS — K315 Obstruction of duodenum: Secondary | ICD-10-CM | POA: Diagnosis present

## 2016-10-12 DIAGNOSIS — Z794 Long term (current) use of insulin: Secondary | ICD-10-CM | POA: Diagnosis not present

## 2016-10-12 DIAGNOSIS — E11649 Type 2 diabetes mellitus with hypoglycemia without coma: Secondary | ICD-10-CM | POA: Diagnosis present

## 2016-10-12 DIAGNOSIS — D709 Neutropenia, unspecified: Secondary | ICD-10-CM | POA: Diagnosis present

## 2016-10-12 DIAGNOSIS — K810 Acute cholecystitis: Secondary | ICD-10-CM

## 2016-10-12 DIAGNOSIS — Z981 Arthrodesis status: Secondary | ICD-10-CM | POA: Diagnosis not present

## 2016-10-12 DIAGNOSIS — K831 Obstruction of bile duct: Secondary | ICD-10-CM | POA: Diagnosis not present

## 2016-10-12 DIAGNOSIS — D849 Immunodeficiency, unspecified: Secondary | ICD-10-CM

## 2016-10-12 DIAGNOSIS — D638 Anemia in other chronic diseases classified elsewhere: Secondary | ICD-10-CM | POA: Diagnosis present

## 2016-10-12 DIAGNOSIS — E119 Type 2 diabetes mellitus without complications: Secondary | ICD-10-CM

## 2016-10-12 DIAGNOSIS — Z885 Allergy status to narcotic agent status: Secondary | ICD-10-CM

## 2016-10-12 DIAGNOSIS — Z79899 Other long term (current) drug therapy: Secondary | ICD-10-CM | POA: Diagnosis not present

## 2016-10-12 DIAGNOSIS — D899 Disorder involving the immune mechanism, unspecified: Secondary | ICD-10-CM | POA: Diagnosis present

## 2016-10-12 DIAGNOSIS — C801 Malignant (primary) neoplasm, unspecified: Secondary | ICD-10-CM | POA: Diagnosis not present

## 2016-10-12 DIAGNOSIS — E876 Hypokalemia: Secondary | ICD-10-CM | POA: Diagnosis present

## 2016-10-12 DIAGNOSIS — C259 Malignant neoplasm of pancreas, unspecified: Secondary | ICD-10-CM | POA: Diagnosis present

## 2016-10-12 DIAGNOSIS — K5909 Other constipation: Secondary | ICD-10-CM | POA: Diagnosis present

## 2016-10-12 DIAGNOSIS — Z833 Family history of diabetes mellitus: Secondary | ICD-10-CM

## 2016-10-12 DIAGNOSIS — I10 Essential (primary) hypertension: Secondary | ICD-10-CM | POA: Diagnosis present

## 2016-10-12 DIAGNOSIS — E785 Hyperlipidemia, unspecified: Secondary | ICD-10-CM | POA: Diagnosis present

## 2016-10-12 DIAGNOSIS — K828 Other specified diseases of gallbladder: Secondary | ICD-10-CM | POA: Diagnosis present

## 2016-10-12 DIAGNOSIS — L299 Pruritus, unspecified: Secondary | ICD-10-CM | POA: Diagnosis present

## 2016-10-12 DIAGNOSIS — E440 Moderate protein-calorie malnutrition: Secondary | ICD-10-CM | POA: Diagnosis not present

## 2016-10-12 DIAGNOSIS — C787 Secondary malignant neoplasm of liver and intrahepatic bile duct: Secondary | ICD-10-CM | POA: Diagnosis present

## 2016-10-12 DIAGNOSIS — Z809 Family history of malignant neoplasm, unspecified: Secondary | ICD-10-CM

## 2016-10-12 DIAGNOSIS — Z9221 Personal history of antineoplastic chemotherapy: Secondary | ICD-10-CM

## 2016-10-12 LAB — CBC WITH DIFFERENTIAL/PLATELET
BASOS ABS: 0 10*3/uL (ref 0.0–0.1)
BASOS PCT: 1 %
Eosinophils Absolute: 0.1 10*3/uL (ref 0.0–0.7)
Eosinophils Relative: 2 %
HEMATOCRIT: 29.9 % — AB (ref 39.0–52.0)
Hemoglobin: 10.1 g/dL — ABNORMAL LOW (ref 13.0–17.0)
LYMPHS PCT: 5 %
Lymphs Abs: 0.2 10*3/uL — ABNORMAL LOW (ref 0.7–4.0)
MCH: 31.3 pg (ref 26.0–34.0)
MCHC: 33.8 g/dL (ref 30.0–36.0)
MCV: 92.6 fL (ref 78.0–100.0)
Monocytes Absolute: 0.2 10*3/uL (ref 0.1–1.0)
Monocytes Relative: 5 %
NEUTROS ABS: 2.8 10*3/uL (ref 1.7–7.7)
NEUTROS PCT: 87 %
Platelets: 128 10*3/uL — ABNORMAL LOW (ref 150–400)
RBC: 3.23 MIL/uL — AB (ref 4.22–5.81)
RDW: 14.6 % (ref 11.5–15.5)
WBC: 3.2 10*3/uL — AB (ref 4.0–10.5)

## 2016-10-12 LAB — URINALYSIS, ROUTINE W REFLEX MICROSCOPIC
BACTERIA UA: NONE SEEN
Ketones, ur: NEGATIVE mg/dL
LEUKOCYTES UA: NEGATIVE
NITRITE: NEGATIVE
PROTEIN: 30 mg/dL — AB
SPECIFIC GRAVITY, URINE: 1.035 — AB (ref 1.005–1.030)
SQUAMOUS EPITHELIAL / LPF: NONE SEEN
pH: 5 (ref 5.0–8.0)

## 2016-10-12 LAB — LIPASE, BLOOD: Lipase: <10 U/L — ABNORMAL LOW (ref 11–51)

## 2016-10-12 LAB — COMPREHENSIVE METABOLIC PANEL
ALBUMIN: 3 g/dL — AB (ref 3.5–5.0)
ALT: 173 U/L — ABNORMAL HIGH (ref 17–63)
ANION GAP: 9 (ref 5–15)
AST: 135 U/L — ABNORMAL HIGH (ref 15–41)
Alkaline Phosphatase: 871 U/L — ABNORMAL HIGH (ref 38–126)
BILIRUBIN TOTAL: 5.8 mg/dL — AB (ref 0.3–1.2)
BUN: 17 mg/dL (ref 6–20)
CO2: 24 mmol/L (ref 22–32)
Calcium: 8.3 mg/dL — ABNORMAL LOW (ref 8.9–10.3)
Chloride: 101 mmol/L (ref 101–111)
Creatinine, Ser: 1.04 mg/dL (ref 0.61–1.24)
Glucose, Bld: 350 mg/dL — ABNORMAL HIGH (ref 65–99)
POTASSIUM: 3.4 mmol/L — AB (ref 3.5–5.1)
Sodium: 134 mmol/L — ABNORMAL LOW (ref 135–145)
TOTAL PROTEIN: 6.1 g/dL — AB (ref 6.5–8.1)

## 2016-10-12 LAB — I-STAT CG4 LACTIC ACID, ED: Lactic Acid, Venous: 1.37 mmol/L (ref 0.5–1.9)

## 2016-10-12 LAB — AMMONIA: AMMONIA: 43 umol/L — AB (ref 9–35)

## 2016-10-12 LAB — BILIRUBIN, DIRECT: Bilirubin, Direct: 3.8 mg/dL — ABNORMAL HIGH (ref 0.1–0.5)

## 2016-10-12 LAB — AMYLASE: AMYLASE: 42 U/L (ref 28–100)

## 2016-10-12 LAB — PROTIME-INR
INR: 0.98
PROTHROMBIN TIME: 13 s (ref 11.4–15.2)

## 2016-10-12 LAB — GLUCOSE, CAPILLARY
GLUCOSE-CAPILLARY: 53 mg/dL — AB (ref 65–99)
GLUCOSE-CAPILLARY: 79 mg/dL (ref 65–99)

## 2016-10-12 LAB — PROCALCITONIN: PROCALCITONIN: 6.7 ng/mL

## 2016-10-12 LAB — LACTIC ACID, PLASMA
LACTIC ACID, VENOUS: 1.9 mmol/L (ref 0.5–1.9)
Lactic Acid, Venous: 1.5 mmol/L (ref 0.5–1.9)

## 2016-10-12 LAB — APTT: APTT: 30 s (ref 24–36)

## 2016-10-12 MED ORDER — ONDANSETRON HCL 4 MG/2ML IJ SOLN
4.0000 mg | Freq: Three times a day (TID) | INTRAMUSCULAR | Status: DC | PRN
  Administered 2016-10-17: 4 mg via INTRAVENOUS
  Filled 2016-10-12: qty 2

## 2016-10-12 MED ORDER — POTASSIUM CHLORIDE 20 MEQ/15ML (10%) PO SOLN
20.0000 meq | Freq: Once | ORAL | Status: AC
  Administered 2016-10-12: 20 meq via ORAL
  Filled 2016-10-12: qty 15

## 2016-10-12 MED ORDER — INSULIN DETEMIR 100 UNIT/ML ~~LOC~~ SOLN
30.0000 [IU] | Freq: Every day | SUBCUTANEOUS | Status: DC
  Administered 2016-10-15 – 2016-10-20 (×5): 30 [IU] via SUBCUTANEOUS
  Filled 2016-10-12 (×8): qty 0.3

## 2016-10-12 MED ORDER — SODIUM CHLORIDE 0.9 % IV BOLUS (SEPSIS)
1500.0000 mL | Freq: Once | INTRAVENOUS | Status: AC
  Administered 2016-10-12: 1500 mL via INTRAVENOUS

## 2016-10-12 MED ORDER — ADULT MULTIVITAMIN W/MINERALS CH
1.0000 | ORAL_TABLET | Freq: Every day | ORAL | Status: DC
  Administered 2016-10-15 – 2016-10-20 (×5): 1 via ORAL
  Filled 2016-10-12 (×6): qty 1

## 2016-10-12 MED ORDER — DEXTROSE 5 % IV SOLN
2.0000 g | Freq: Two times a day (BID) | INTRAVENOUS | Status: DC
  Administered 2016-10-13 – 2016-10-18 (×11): 2 g via INTRAVENOUS
  Filled 2016-10-12 (×11): qty 2

## 2016-10-12 MED ORDER — POLYETHYLENE GLYCOL 3350 17 G PO PACK
17.0000 g | PACK | Freq: Every day | ORAL | Status: DC | PRN
  Administered 2016-10-17 – 2016-10-18 (×3): 17 g via ORAL
  Filled 2016-10-12 (×3): qty 1

## 2016-10-12 MED ORDER — SODIUM CHLORIDE 0.9 % IV SOLN
INTRAVENOUS | Status: DC
  Administered 2016-10-13: 01:00:00 via INTRAVENOUS

## 2016-10-12 MED ORDER — DEXTROSE 5 % IV SOLN
2.0000 g | Freq: Once | INTRAVENOUS | Status: AC
  Administered 2016-10-12: 2 g via INTRAVENOUS
  Filled 2016-10-12: qty 2

## 2016-10-12 MED ORDER — PANTOPRAZOLE SODIUM 40 MG PO TBEC
40.0000 mg | DELAYED_RELEASE_TABLET | Freq: Every day | ORAL | Status: DC
  Administered 2016-10-15 – 2016-10-20 (×5): 40 mg via ORAL
  Filled 2016-10-12 (×6): qty 1

## 2016-10-12 MED ORDER — LORATADINE 10 MG PO TABS
10.0000 mg | ORAL_TABLET | Freq: Every day | ORAL | Status: DC
  Administered 2016-10-15 – 2016-10-20 (×5): 10 mg via ORAL
  Filled 2016-10-12 (×6): qty 1

## 2016-10-12 MED ORDER — PANCRELIPASE (LIP-PROT-AMYL) 12000-38000 UNITS PO CPEP
36000.0000 [IU] | ORAL_CAPSULE | Freq: Three times a day (TID) | ORAL | Status: DC
  Administered 2016-10-14 – 2016-10-20 (×17): 36000 [IU] via ORAL
  Filled 2016-10-12: qty 1
  Filled 2016-10-12 (×3): qty 3
  Filled 2016-10-12 (×2): qty 1
  Filled 2016-10-12 (×7): qty 3
  Filled 2016-10-12 (×2): qty 1
  Filled 2016-10-12 (×5): qty 3
  Filled 2016-10-12: qty 1

## 2016-10-12 MED ORDER — LOPERAMIDE HCL 2 MG PO CAPS: 2.0000 mg | ORAL_CAPSULE | ORAL | Status: DC | PRN

## 2016-10-12 MED ORDER — MORPHINE SULFATE (PF) 2 MG/ML IV SOLN
2.0000 mg | INTRAVENOUS | Status: DC | PRN
  Administered 2016-10-16 – 2016-10-17 (×7): 2 mg via INTRAVENOUS
  Filled 2016-10-12 (×8): qty 1

## 2016-10-12 MED ORDER — LIDOCAINE-PRILOCAINE 2.5-2.5 % EX CREA
1.0000 "application " | TOPICAL_CREAM | CUTANEOUS | Status: DC | PRN
  Filled 2016-10-12: qty 5

## 2016-10-12 MED ORDER — SODIUM CHLORIDE 0.9 % IV BOLUS (SEPSIS)
1000.0000 mL | Freq: Once | INTRAVENOUS | Status: AC
  Administered 2016-10-12: 1000 mL via INTRAVENOUS

## 2016-10-12 MED ORDER — SODIUM CHLORIDE 0.9% FLUSH: 3.0000 mL | Freq: Two times a day (BID) | INTRAVENOUS | Status: DC

## 2016-10-12 MED ORDER — IBUPROFEN 200 MG PO TABS
400.0000 mg | ORAL_TABLET | Freq: Four times a day (QID) | ORAL | Status: DC | PRN
  Administered 2016-10-13 – 2016-10-19 (×6): 400 mg via ORAL
  Filled 2016-10-12 (×6): qty 2

## 2016-10-12 MED ORDER — INSULIN ASPART 100 UNIT/ML ~~LOC~~ SOLN
0.0000 [IU] | Freq: Three times a day (TID) | SUBCUTANEOUS | Status: DC
  Administered 2016-10-14: 5 [IU] via SUBCUTANEOUS
  Administered 2016-10-14 – 2016-10-15 (×3): 3 [IU] via SUBCUTANEOUS
  Administered 2016-10-15: 2 [IU] via SUBCUTANEOUS
  Administered 2016-10-15: 9 [IU] via SUBCUTANEOUS
  Administered 2016-10-16: 1 [IU] via SUBCUTANEOUS
  Administered 2016-10-17: 2 [IU] via SUBCUTANEOUS
  Administered 2016-10-17: 7 [IU] via SUBCUTANEOUS
  Administered 2016-10-17 – 2016-10-18 (×2): 5 [IU] via SUBCUTANEOUS
  Administered 2016-10-18: 9 [IU] via SUBCUTANEOUS
  Administered 2016-10-19: 1 [IU] via SUBCUTANEOUS
  Administered 2016-10-19: 9 [IU] via SUBCUTANEOUS
  Administered 2016-10-19: 5 [IU] via SUBCUTANEOUS
  Administered 2016-10-20: 7 [IU] via SUBCUTANEOUS

## 2016-10-12 MED ORDER — BACLOFEN 10 MG PO TABS: 5.0000 mg | ORAL_TABLET | Freq: Three times a day (TID) | ORAL | Status: DC | PRN

## 2016-10-12 MED ORDER — SODIUM CHLORIDE 0.9% FLUSH
10.0000 mL | INTRAVENOUS | Status: DC | PRN
  Administered 2016-10-12 – 2016-10-20 (×5): 10 mL
  Filled 2016-10-12 (×5): qty 40

## 2016-10-12 MED ORDER — ZOLPIDEM TARTRATE 5 MG PO TABS
5.0000 mg | ORAL_TABLET | Freq: Every evening | ORAL | Status: DC | PRN
  Administered 2016-10-14 – 2016-10-15 (×3): 5 mg via ORAL
  Filled 2016-10-12 (×3): qty 1

## 2016-10-12 NOTE — ED Provider Notes (Signed)
[facility] DEPT Provider Note   CSN: [id number] Arrival date & time: 10/12/16  1533     History   Chief Complaint Chief Complaint  Patient presents with  . Chills  . Abnormal Lab    HPI Jacob Hardin is a 70 y.o. male.  HPI CC: Chills  Onset/Duration: 2 days Timing: intermittent Location: generalized Quality: chils Severity: moderate Modifying Factors:  Improved by: tylenol  Worsened by: nothing Associated Signs/Symptoms:  Pertinent (+): pruritis  Pertinent (-): fever uri sx, cp, sob, abd pain, n/v/d, dysuria Context: h/o Panc Ca on chemo (x3) s/p biliary stents. Had labs on Wed and noted that bilirubin was elevated; chemo held at that time.   Past Medical History:  Diagnosis Date  . Hyperlipidemia   . Hypertension    recently weight lost-no meds now-running low.  Marland Kitchen PONV (postoperative nausea and vomiting)   . Primary pancreatic adenocarcinoma (HCC)    mets to liver and duodenum  . Skin cancer    "burned off my face & head"  . Type II diabetes mellitus [facility])     Patient Active Problem List   Diagnosis Date Noted  . GERD (gastroesophageal reflux disease) 10/12/2016  . Hyperbilirubinemia 10/12/2016  . Hypokalemia 10/12/2016  . Port catheter in place 02/14/2016  . Malnutrition of moderate degree 01/21/2016  . Immunocompromised patient ([place])   . Viridans streptococci infection   . Bacteremia   . Central line infection   . Metastatic cancer ([place])   . Sepsis ([place]) 01/17/2016  . SIRS (systemic inflammatory response syndrome) ([place]) 01/17/2016  . Cancer of head of pancreas ([place]) 11/24/2015  . Diabetes mellitus without complication ([place])   . Hyperlipidemia   . Hypertension     Past Surgical History:  Procedure Laterality Date  . ANTERIOR CERVICAL DECOMP/DISCECTOMY FUSION  1994   cervical-bone graft  . BACK SURGERY    . COLONOSCOPY    . DUODENAL STENT PLACEMENT N/A 11/22/2015   Procedure: DUODENAL STENT PLACEMENT;  Surgeon: Carol Ada, MD;   Location: [facility];  Service: Endoscopy;  Laterality: N/A;  . ESOPHAGOGASTRODUODENOSCOPY N/A 11/18/2015   Procedure: ESOPHAGOGASTRODUODENOSCOPY (EGD);  Surgeon: Carol Ada, MD;  Location: [facility] ENDOSCOPY;  Service: Endoscopy;  Laterality: N/A;  . ESOPHAGOGASTRODUODENOSCOPY N/A 11/22/2015   Procedure: ESOPHAGOGASTRODUODENOSCOPY (EGD);  Surgeon: Carol Ada, MD;  Location: [facility] ENDOSCOPY;  Service: Endoscopy;  Laterality: N/A;  Uncovered duodenal stent placement.  Fluoroscopy required.  . EUS  11/18/2015   Procedure: UPPER ENDOSCOPIC ULTRASOUND (EUS) LINEAR;  Surgeon: Carol Ada, MD;  Location: [facility];  Service: Endoscopy;;  . INGUINAL HERNIA REPAIR Left   . SHOULDER ARTHROSCOPY W/ ROTATOR CUFF REPAIR Left 2014  . TONSILLECTOMY    . WISDOM TOOTH EXTRACTION         Home Medications    Prior to Admission medications   Medication Sig Start Date End Date Taking? Authorizing Provider  baclofen (LIORESAL) 10 MG tablet Take 5 mg by mouth 3 (three) times daily as needed (for hiccups).   Yes Historical Provider, MD  esomeprazole (NEXIUM) 40 MG capsule Take 40 mg by mouth daily.   Yes Historical Provider, MD  insulin aspart (NOVOLOG FLEXPEN) 100 UNIT/ML FlexPen Inject 4 Units into the skin daily before supper.   Yes Historical Provider, MD  Insulin Detemir (LEVEMIR FLEXTOUCH) 100 UNIT/ML Pen Inject 40 Units into the skin daily.   Yes Historical Provider, MD  lidocaine-prilocaine (EMLA) cream Apply 1 application topically as needed (prior to accessing port).   Yes Historical Provider, MD  lipase/protease/amylase (CREON) 36000 UNITS CPEP capsule Take 1 capsule (36,000 Units total) by mouth 3 (three) times daily before meals. 08/22/16  Yes Ladell Pier, MD  loperamide (IMODIUM) 2 MG capsule Take 2 mg by mouth as needed for diarrhea or loose stools.   Yes Historical Provider, MD  loratadine (CLARITIN) 10 MG tablet Take 10 mg by mouth daily.    Yes Historical Provider, MD  Multiple Vitamin  (MULTIVITAMIN WITH MINERALS) TABS tablet Take 1 tablet by mouth daily.   Yes Historical Provider, MD  ondansetron (ZOFRAN) 8 MG tablet Take 8 mg by mouth every 6 (six) hours as needed for nausea or vomiting.    Yes Historical Provider, MD  polyethylene glycol (MIRALAX / GLYCOLAX) packet Take 17 g by mouth daily as needed for mild constipation.    Yes Historical Provider, MD  prochlorperazine (COMPAZINE) 10 MG tablet Take 1 tablet (10 mg total) by mouth every 6 (six) hours as needed for nausea or vomiting. 12/02/15  Yes Ladell Pier, MD    Family History Family History  Problem Relation Age of Onset  . Diabetes Mother   . Hypertension Mother   . Cancer Father     lung 64  . Hypertension Father   . Cancer Brother     pancreatic   . Diabetes Brother   . Cancer Paternal Grandfather     colon  . Cancer Paternal Uncle     prostate    Social History Social History  Substance Use Topics  . Smoking status: Never Smoker  . Smokeless tobacco: Never Used  . Alcohol use No     Allergies   Codeine   Review of Systems Review of Systems  Ten systems are reviewed and are negative for acute change except as noted in the HPI  Physical Exam Updated Vital Signs BP 142/74   Pulse 102   Temp 99.1 F (37.3 C) (Oral) Comment: took 2 tylenol @ 2:30pm  Resp 15   SpO2 97%   Physical Exam  Constitutional: He is oriented to person, place, and time. He appears well-developed and well-nourished. No distress.  HENT:  Head: Normocephalic and atraumatic.  Nose: Nose normal.  Eyes: Conjunctivae and EOM are normal. Pupils are equal, round, and reactive to light. Right eye exhibits no discharge. Left eye exhibits no discharge. No scleral icterus.  Neck: Normal range of motion. Neck supple.  Cardiovascular: Normal rate and regular rhythm.  Exam reveals no gallop and no friction rub.   No murmur heard. Pulmonary/Chest: Effort normal and breath sounds normal. No stridor. No respiratory distress.  He has no rales.    Abdominal: Soft. He exhibits no distension. There is no tenderness.  Musculoskeletal: He exhibits no edema or tenderness.  Neurological: He is alert and oriented to person, place, and time.  Skin: Skin is warm and dry. No rash noted. He is not diaphoretic. No erythema.  jaundice  Psychiatric: He has a normal mood and affect.  Vitals reviewed.    ED Treatments / Results  Labs (all labs ordered are listed, but only abnormal results are displayed) Labs Reviewed  COMPREHENSIVE METABOLIC PANEL - Abnormal; Notable for the following:       Result Value   Sodium 134 (*)    Potassium 3.4 (*)    Glucose, Bld 350 (*)    Calcium 8.3 (*)    Total Protein 6.1 (*)    Albumin 3.0 (*)    AST 135 (*)    ALT 173 (*)  Alkaline Phosphatase 871 (*)    Total Bilirubin 5.8 (*)    All other components within normal limits  CBC WITH DIFFERENTIAL/PLATELET - Abnormal; Notable for the following:    WBC 3.2 (*)    RBC 3.23 (*)    Hemoglobin 10.1 (*)    HCT 29.9 (*)    Platelets 128 (*)    Lymphs Abs 0.2 (*)    All other components within normal limits  URINALYSIS, ROUTINE W REFLEX MICROSCOPIC - Abnormal; Notable for the following:    Color, Urine AMBER (*)    Specific Gravity, Urine 1.035 (*)    Glucose, UA >=500 (*)    Hgb urine dipstick SMALL (*)    Bilirubin Urine MODERATE (*)    Protein, ur 30 (*)    All other components within normal limits  AMMONIA - Abnormal; Notable for the following:    Ammonia 43 (*)    All other components within normal limits  BILIRUBIN, DIRECT - Abnormal; Notable for the following:    Bilirubin, Direct 3.8 (*)    All other components within normal limits  LIPASE, BLOOD - Abnormal; Notable for the following:    Lipase <10 (*)    All other components within normal limits  GLUCOSE, CAPILLARY - Abnormal; Notable for the following:    Glucose-Capillary 53 (*)    All other components within normal limits  RESPIRATORY PANEL BY PCR  CULTURE,  BLOOD (ROUTINE X 2)  CULTURE, BLOOD (ROUTINE X 2)  AMYLASE  LACTIC ACID, PLASMA  LACTIC ACID, PLASMA  PROCALCITONIN  PROTIME-INR  APTT  GLUCOSE, CAPILLARY  COMPREHENSIVE METABOLIC PANEL  CBC  I-STAT CG4 LACTIC ACID, ED    EKG  EKG Interpretation None       Radiology Dg Chest 2 View  Result Date: 10/12/2016 CLINICAL DATA:  Fever, chills, pancreatic cancer EXAM: CHEST  2 VIEW COMPARISON:  01/09/2016 FINDINGS: Lungs are clear.  No pleural effusion or pneumothorax. The heart is normal in size. Right chest port terminates in the lower SVC. IMPRESSION: No evidence of acute cardiopulmonary disease. Electronically Signed   By: Julian Hy M.D.   On: 10/12/2016 19:53   US Abdomen Complete  Result Date: 10/11/2016 CLINICAL DATA:  History of pancreatic head carcinoma with biliary stent present EXAM: ABDOMEN ULTRASOUND COMPLETE COMPARISON:  CT abdomen and pelvis July 30, 2016 FINDINGS: Gallbladder: Within the gallbladder, there are multiple echogenic foci which move but do not shadow. Gallbladder wall is mildly thickened and subtly edematous. There is no pericholecystic fluid. Largest individual echogenic focus measures 9 mm in length. The patient is not focally tender over the gallbladder during this examination. Common bile duct: Diameter: 11 mm, dilated. Areas seen in the intrahepatic ducts as well as in the common bile duct. Stent not well seen, likely due to surrounding air. Liver: The liver has an inhomogeneous appearance with several nodular appearing areas throughout the liver ranging in size from 1-2 cm. Assessment of the liver is somewhat less than optimal due to air within the biliary ducts stool system. IVC: No abnormality visualized. Pancreas: There is a mass in the head of the pancreas which is somewhat hypoechoic measuring 1.9 x 1.6 x 1.2 cm. Note that portions of pancreas are obscured by gas. Spleen: Size and appearance within normal limits. Right Kidney: Length: 12.0 cm.  Echogenicity within normal limits. No mass or hydronephrosis visualized. Left Kidney: Length: 10.0 cm. Echogenicity within normal limits. No mass or hydronephrosis visualized. Abdominal aorta: No aneurysm visualized. Note that  gas obscures portions of the mid the distal aorta. Other findings: No evidence of ascites. IMPRESSION: Gallbladder contains sludge with probable nonshadowing gallstones. Gallbladder wall is thickened and subtly edematous. Suspect a degree of acute cholecystitis. Biliary ducts are dilated and contain air. Liver shows abnormal echogenicity with nodular opacities throughout the liver, concerning for metastatic disease superimposed on hepatic steatosis. Small mass in head of pancreas. Note that portions of pancreas not visualized. Portions of aorta not visualized. Visualized portions of aorta appear normal. Given this constellation of findings, it may be prudent to consider correlation with CT or MR of the abdomen, ideally with oral and intravenous contrast, to further evaluate. In particular, contrast-enhanced CT or MR could be helpful to assess for degree of potential hepatic metastatic disease. These results will be called to the ordering clinician or representative by the Radiologist Assistant, and communication documented in the PACS or zVision Dashboard. Electronically Signed   By: Lowella Grip III M.D.   On: 10/11/2016 08:53    Procedures Procedures (including critical care time)  Medications Ordered in ED Medications  baclofen (LIORESAL) tablet 5 mg (not administered)  insulin detemir (LEVEMIR) injection 30 Units (not administered)  lidocaine-prilocaine (EMLA) cream 1 application (not administered)  loperamide (IMODIUM) capsule 2 mg (not administered)  multivitamin with minerals tablet 1 tablet (not administered)  lipase/protease/amylase (CREON) capsule 36,000 Units (not administered)  pantoprazole (PROTONIX) EC tablet 40 mg (not administered)  polyethylene glycol  (MIRALAX / GLYCOLAX) packet 17 g (not administered)  loratadine (CLARITIN) tablet 10 mg (not administered)  ondansetron (ZOFRAN) injection 4 mg (not administered)  morphine 2 MG/ML injection 2 mg (not administered)  0.9 %  sodium chloride infusion ( Intravenous New Bag/Given 10/13/16 0035)  sodium chloride flush (NS) 0.9 % injection 3 mL (3 mLs Intravenous Not Given 10/12/16 2319)  zolpidem (AMBIEN) tablet 5 mg (not administered)  insulin aspart (novoLOG) injection 0-9 Units (not administered)  ibuprofen (ADVIL,MOTRIN) tablet 400 mg (not administered)  ceFEPIme (MAXIPIME) 2 g in dextrose 5 % 50 mL IVPB (not administered)  sodium chloride flush (NS) 0.9 % injection 10-40 mL (10 mLs Intracatheter Given 10/12/16 2307)  sodium chloride 0.9 % bolus 1,000 mL (0 mLs Intravenous Stopped 10/12/16 1745)  ceFEPIme (MAXIPIME) 2 g in dextrose 5 % 50 mL IVPB (0 g Intravenous Stopped 10/12/16 2052)  sodium chloride 0.9 % bolus 1,500 mL (1,500 mLs Intravenous New Bag/Given 10/12/16 2009)  potassium chloride 20 MEQ/15ML (10%) solution 20 mEq (20 mEq Oral Given 10/12/16 2318)     Initial Impression / Assessment and Plan / ED Course  I have reviewed the triage vital signs and the nursing notes.  Pertinent labs & imaging results that were available during my care of the patient were reviewed by me and considered in my medical decision making (see chart for details).  Clinical Course     Neutropenic fever started on enteric antibiotics. Patient also with worsening biliary obstruction. Discussed case with oncology who agreed with admission. We'll admit to hospitalist service for continued workup and management. Discussed case with gastroenterology who will follow along.  Final Clinical Impressions(s) / ED Diagnoses   Final diagnoses:  Fever  Biliary obstruction      Fatima Blank, MD 10/13/16 [phone]

## 2016-10-12 NOTE — Telephone Encounter (Signed)
Received call from wife Butch Penny reporting pt having trembling all over.  Spoke with Butch Penny and was informed that pt had just taken 2 Tylenol tabs.  Temp post Tylenol was 96.8 per Butch Penny.  Pt is still shaking, and c/o chills all over.  Denied nausea/vomiting. Lattie Haw, NP notified who consulted with Dr. Burr Medico. Spoke with Butch Penny, and instructed her to take pt to ER for further evaluation as per Dr. Ernestina Penna instructions.   Butch Penny voiced understanding. Butch Penny' s  Phone     [phone].

## 2016-10-12 NOTE — ED Notes (Signed)
Pt aware awaiting oncology consult.

## 2016-10-12 NOTE — ED Notes (Signed)
Patient transported to X-ray 

## 2016-10-12 NOTE — Telephone Encounter (Signed)
-----   Message from Owens Shark, NP sent at 10/12/2016  9:55 AM EST ----- Please let him know Dr [name] would like for him to have CT scans. I have entered order.

## 2016-10-12 NOTE — Telephone Encounter (Signed)
Received call from [facility] calling report of US Abdomen done 10/11/16.   Report printed and gave to Lattie Haw, NP.   Per NP, results had been reviewed and appropriate measures taken.

## 2016-10-12 NOTE — ED Notes (Signed)
I set of Blood cultured sent down to lab prior to IV antboitic given

## 2016-10-12 NOTE — Progress Notes (Signed)
Pharmacy Antibiotic Note  Jacob Hardin is a 70 y.o. 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 Also considering UTI, but UA negative and no urinary Sx. Pharmacy has been consulted for Cefepime dosing. Last chemo 12/1, and noted ANC 2.8  Plan:  Cefepime 2 g IV q12 hr    Temp (24hrs), Avg:99.7 F (37.6 C), Min:99.1 F (37.3 C), Max:100.2 F (37.9 C)   Recent Labs Lab 10/10/16 0742 10/12/16 1612 10/12/16 1621  WBC 3.9* 3.2*  --   CREATININE 0.8 1.04  --   LATICACIDVEN  --   --  1.37    Estimated Creatinine Clearance: 69.9 mL/min (by C-G formula based on SCr of 1.04 mg/dL).    Allergies  Allergen Reactions  . Codeine Nausea Only    Antimicrobials this admission: Cefepime 12/22 >>   Dose adjustments this admission: ---  Microbiology results: 12/22 BCx: sent 12/22 resp panel:     Thank you for allowing pharmacy to be a part of this patient's care.  Reuel Boom, PharmD, BCPS Pager: [phone] 10/12/2016, 8:43 PM

## 2016-10-12 NOTE — ED Notes (Signed)
[facility] aware of pt temperature and bolus completion.

## 2016-10-12 NOTE — Telephone Encounter (Signed)
Call placed to patient's wife and notified per L. Marcello Moores NP that Dr. [name] would like for patient to have CT scans.  Patient's wife appreciative of call and has no questions at this time.

## 2016-10-12 NOTE — ED Triage Notes (Signed)
Pt is a pancreatic CA pt with Dr. [name]. States that he has had chills today and last night and has been taking tylenol/ibuprofen. Was told that he now has a slight blockage of the stent in his pancreas. Pt's bilirubin and liver enzymes are high and they are suspicious for infection. Alert and oriented.

## 2016-10-12 NOTE — H&P (Addendum)
History and Physical    Jacob Hardin [id number] DOB: [birth year] DOA: 10/12/2016  Referring MD/NP/PA:   PCP: [name], MD   Patient coming from:  The patient is coming from home.  At baseline, pt is independent for most of ADL.   Chief Complaint: chills  HPI: Jacob Hardin is a 70 y.o. 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 Hung, hx of obstructive jaundice secondary (status post placement of a percutaneous internal/external biliary drain 11/25/2015; biliary drainage catheter capped 12/02/2015; biliary drain internalized 12/13/2015), currently on chemotherapy, hypertension, hyperlipidemia, diabetes mellitus, GERD, who presents with chills.   Patient reports that she started having chills since last night, denies subjective fever, but his temperature is 100.2 in ED. Patient denies GI symptoms, respiratory symptoms or symptoms of UTI. No nausea, vomiting, abdominal pain, diarrhea, cough, runny nose, sore throat, shortness of breath, chest pain, dysuria or burning on urination. No unilateral weakness. Pt had US-abd showed sludge with probable nonshadowing gallstones, gallbladder wall thickened and subtly edematous, suspecting a degree of acute cholecystitis, biliary ducts are dilated and contain air, and metastasized to liver disease.  ED Course: pt was found to have WBC 3.2, lactate 1.37, negative urinalysis, potassium 3.4, creatinine 1.04, abnormal liver function with total bilirubin 5.8, ALP 871, AST 175 and ALT 173. temperature 100.2, tachycardia, oxygen saturation 97% on room air. Pt is admitted to telemetry bed as inpatient. Oncology and GI were consulted.  Review of Systems:   General: has fevers, chills, no changes in body weight, has poor appetite, has fatigue HEENT: no blurry vision, hearing changes or sore throat Respiratory: no dyspnea, coughing,  wheezing CV: no chest pain, no palpitations GI: no nausea, vomiting, abdominal pain, diarrhea, constipation GU: no dysuria, burning on urination, increased urinary frequency, hematuria  Ext: no leg edema Neuro: no unilateral weakness, numbness, or tingling, no vision change or hearing loss Skin: no rash, no skin tear. MSK: No muscle spasm, no deformity, no limitation of range of movement in spin Heme: No easy bruising.  Travel history: No recent long distant travel.  Allergy:  Allergies  Allergen Reactions  . Codeine Nausea Only    Past Medical History:  Diagnosis Date  . Hyperlipidemia   . Hypertension    recently weight lost-no meds now-running low.  Marland Kitchen PONV (postoperative nausea and vomiting)   . Primary pancreatic adenocarcinoma (HCC)    mets to liver and duodenum  . Skin cancer    "burned off my face & head"  . Type II diabetes mellitus ([place])     Past Surgical History:  Procedure Laterality Date  . ANTERIOR CERVICAL DECOMP/DISCECTOMY FUSION  1994   cervical-bone graft  . BACK SURGERY    . COLONOSCOPY    . DUODENAL STENT PLACEMENT N/A 11/22/2015   Procedure: DUODENAL STENT PLACEMENT;  Surgeon: Carol Ada, MD;  Location: [place];  Service: Endoscopy;  Laterality: N/A;  . ESOPHAGOGASTRODUODENOSCOPY N/A 11/18/2015   Procedure: ESOPHAGOGASTRODUODENOSCOPY (EGD);  Surgeon: Carol Ada, MD;  Location: [facility] ENDOSCOPY;  Service: Endoscopy;  Laterality: N/A;  . ESOPHAGOGASTRODUODENOSCOPY N/A 11/22/2015   Procedure: ESOPHAGOGASTRODUODENOSCOPY (EGD);  Surgeon: Carol Ada, MD;  Location[facility] END[facility]COPY;  Service: Endoscopy;  Laterality: N/A;  Uncovered duodenal stent placement.  Fluoroscopy required.  . EUS  11/18/2015   Procedure: UPPER ENDOSCOPIC ULTRASOUND (EUS) LINEAR;  Surgeon: Carol Ada, MD;  Location: [place];  Service: Endoscopy;;  . INGUINAL HERNIA REPAIR Left   .  SHOULDER ARTHROSCOPY W/ ROTATOR CUFF REPAIR Left 2014  . TONSILLECTOMY    . WISDOM TOOTH  EXTRACTION      Social History:  reports that he has never smoked. He has never used smokeless tobacco. He reports that he does not drink alcohol or use drugs.  Family History:  Family History  Problem Relation Age of Onset  . Diabetes Mother   . Hypertension Mother   . Cancer Father     lung 22  . Hypertension Father   . Cancer Brother     pancreatic   . Diabetes Brother   . Cancer Paternal Grandfather     colon  . Cancer Paternal Uncle     prostate     Prior to Admission medications   Medication Sig Start Date End Date Taking? Authorizing Provider  baclofen (LIORESAL) 10 MG tablet Take 5 mg by mouth 3 (three) times daily as needed (for hiccups).   Yes Historical Provider, MD  esomeprazole (NEXIUM) 40 MG capsule Take 40 mg by mouth daily.   Yes Historical Provider, MD  insulin aspart (NOVOLOG FLEXPEN) 100 UNIT/ML FlexPen Inject 4 Units into the skin daily before supper.   Yes Historical Provider, MD  Insulin Detemir (LEVEMIR FLEXTOUCH) 100 UNIT/ML Pen Inject 40 Units into the skin daily.   Yes Historical Provider, MD  lidocaine-prilocaine (EMLA) cream Apply 1 application topically as needed (prior to accessing port).   Yes Historical Provider, MD  lipase/protease/amylase (CREON) 36000 UNITS CPEP capsule Take 1 capsule (36,000 Units total) by mouth 3 (three) times daily before meals. 08/22/16  Yes Ladell Pier, MD  loperamide (IMODIUM) 2 MG capsule Take 2 mg by mouth as needed for diarrhea or loose stools.   Yes Historical Provider, MD  loratadine (CLARITIN) 10 MG tablet Take 10 mg by mouth daily.    Yes Historical Provider, MD  Multiple Vitamin (MULTIVITAMIN WITH MINERALS) TABS tablet Take 1 tablet by mouth daily.   Yes Historical Provider, MD  ondansetron (ZOFRAN) 8 MG tablet Take 8 mg by mouth every 6 (six) hours as needed for nausea or vomiting.    Yes Historical Provider, MD  polyethylene glycol (MIRALAX / GLYCOLAX) packet Take 17 g by mouth daily as needed for mild  constipation.    Yes Historical Provider, MD  prochlorperazine (COMPAZINE) 10 MG tablet Take 1 tablet (10 mg total) by mouth every 6 (six) hours as needed for nausea or vomiting. 12/02/15  Yes Ladell Pier, MD    Physical Exam: Vitals:   10/12/16 1730 10/12/16 1745 10/12/16 1827 10/12/16 1922  BP: 128/75  135/72 145/74  Pulse: 80  88 88  Resp: 16  16 20   Temp:  100.2 F (37.9 C)    TempSrc:  Oral    SpO2: 96%  97% 98%   General: Not in acute distress HEENT:       Eyes: PERRL, EOMI, no scleral icterus.       ENT: No discharge from the ears and nose, no pharynx injection, no tonsillar enlargement.        Neck: No JVD, no bruit, no mass felt. Heme: No neck lymph node enlargement. Cardiac: S1/S2, RRR, No murmurs, No gallops or rubs. Respiratory: No rales, wheezing, rhonchi or rubs. GI: Soft, nondistended, nontender, no rebound pain, no organomegaly, BS present. Has Port-A line in R upper chest with clean surroundings. GU: No hematuria Ext: No pitting leg edema bilaterally. 2+DP/PT pulse bilaterally. Musculoskeletal: No joint deformities, No joint redness or warmth, no limitation  of ROM in spin. Skin: No rashes.  Neuro: Alert, oriented X3, cranial nerves II-XII grossly intact, moves all extremities normally.  Labs on Admission: I have personally reviewed following labs and imaging studies  CBC:  Recent Labs Lab 10/10/16 0742 10/12/16 1612  WBC 3.9* 3.2*  NEUTROABS 2.4 2.8  HGB 10.9* 10.1*  HCT 32.5* 29.9*  MCV 93.1 92.6  PLT 143 0000000*   Basic Metabolic Panel:  Recent Labs Lab 10/10/16 0742 10/12/16 1612  NA 136 134*  K 3.7 3.4*  CL  --  101  CO2 27 24  GLUCOSE 219* 350*  BUN 11.5 17  CREATININE 0.8 1.04  CALCIUM 9.0 8.3*   GFR: Estimated Creatinine Clearance: 69.9 mL/min (by C-G formula based on SCr of 1.04 mg/dL). Liver Function Tests:  Recent Labs Lab 10/10/16 0742 10/12/16 1612  AST 173* 135*  ALT 204* 173*  ALKPHOS 1,064* 871*  BILITOT 2.18* 5.8*   PROT 6.6 6.1*  ALBUMIN 3.1* 3.0*    Recent Labs Lab 10/12/16 1612  LIPASE <10*  AMYLASE 42    Recent Labs Lab 10/12/16 1612  AMMONIA 43*   Coagulation Profile: No results for input(s): INR, PROTIME in the last 168 hours. Cardiac Enzymes: No results for input(s): CKTOTAL, CKMB, CKMBINDEX, TROPONINI in the last 168 hours. BNP (last 3 results) No results for input(s): PROBNP in the last 8760 hours. HbA1C: No results for input(s): HGBA1C in the last 72 hours. CBG: No results for input(s): GLUCAP in the last 168 hours. Lipid Profile: No results for input(s): CHOL, HDL, LDLCALC, TRIG, CHOLHDL, LDLDIRECT in the last 72 hours. Thyroid Function Tests: No results for input(s): TSH, T4TOTAL, FREET4, T3FREE, THYROIDAB in the last 72 hours. Anemia Panel: No results for input(s): VITAMINB12, FOLATE, FERRITIN, TIBC, IRON, RETICCTPCT in the last 72 hours. Urine analysis:    Component Value Date/Time   COLORURINE AMBER (A) 10/12/2016 1540   APPEARANCEUR CLEAR 10/12/2016 1540   LABSPEC 1.035 (H) 10/12/2016 1540   PHURINE 5.0 10/12/2016 1540   GLUCOSEU >=500 (A) 10/12/2016 1540   HGBUR SMALL (A) 10/12/2016 1540   BILIRUBINUR MODERATE (A) 10/12/2016 1540   KETONESUR NEGATIVE 10/12/2016 1540   PROTEINUR 30 (A) 10/12/2016 1540   NITRITE NEGATIVE 10/12/2016 1540   LEUKOCYTESUR NEGATIVE 10/12/2016 1540   Sepsis Labs: @LABRCNTIP (procalcitonin:4,lacticidven:4) )No results found for this or any previous visit (from the past 240 hour(s)).   Radiological Exams on Admission: Dg Chest 2 View  Result Date: 10/12/2016 CLINICAL DATA:  Fever, chills, pancreatic cancer EXAM: CHEST  2 VIEW COMPARISON:  01/09/2016 FINDINGS: Lungs are clear.  No pleural effusion or pneumothorax. The heart is normal in size. Right chest port terminates in the lower SVC. IMPRESSION: No evidence of acute cardiopulmonary disease. Electronically Signed   By: Julian Hy M.D.   On: 10/12/2016 19:53   US Abdomen  Complete  Result Date: 10/11/2016 CLINICAL DATA:  History of pancreatic head carcinoma with biliary stent present EXAM: ABDOMEN ULTRASOUND COMPLETE COMPARISON:  CT abdomen and pelvis July 30, 2016 FINDINGS: Gallbladder: Within the gallbladder, there are multiple echogenic foci which move but do not shadow. Gallbladder wall is mildly thickened and subtly edematous. There is no pericholecystic fluid. Largest individual echogenic focus measures 9 mm in length. The patient is not focally tender over the gallbladder during this examination. Common bile duct: Diameter: 11 mm, dilated. Areas seen in the intrahepatic ducts as well as in the common bile duct. Stent not well seen, likely due to surrounding air. Liver: The  liver has an inhomogeneous appearance with several nodular appearing areas throughout the liver ranging in size from 1-2 cm. Assessment of the liver is somewhat less than optimal due to air within the biliary ducts stool system. IVC: No abnormality visualized. Pancreas: There is a mass in the head of the pancreas which is somewhat hypoechoic measuring 1.9 x 1.6 x 1.2 cm. Note that portions of pancreas are obscured by gas. Spleen: Size and appearance within normal limits. Right Kidney: Length: 12.0 cm. Echogenicity within normal limits. No mass or hydronephrosis visualized. Left Kidney: Length: 10.0 cm. Echogenicity within normal limits. No mass or hydronephrosis visualized. Abdominal aorta: No aneurysm visualized. Note that gas obscures portions of the mid the distal aorta. Other findings: No evidence of ascites. IMPRESSION: Gallbladder contains sludge with probable nonshadowing gallstones. Gallbladder wall is thickened and subtly edematous. Suspect a degree of acute cholecystitis. Biliary ducts are dilated and contain air. Liver shows abnormal echogenicity with nodular opacities throughout the liver, concerning for metastatic disease superimposed on hepatic steatosis. Small mass in head of pancreas.  Note that portions of pancreas not visualized. Portions of aorta not visualized. Visualized portions of aorta appear normal. Given this constellation of findings, it may be prudent to consider correlation with CT or MR of the abdomen, ideally with oral and intravenous contrast, to further evaluate. In particular, contrast-enhanced CT or MR could be helpful to assess for degree of potential hepatic metastatic disease. These results will be called to the ordering clinician or representative by the Radiologist Assistant, and communication documented in the PACS or zVision Dashboard. Electronically Signed   By: Lowella Grip III M.D.   On: 10/11/2016 08:53     EKG: Independently reviewed. Sinus rhythm, QTC 430, LAd, poor R-wave progression.   Assessment/Plan Principal Problem:   Sepsis ([place]) Active Problems:   Diabetes mellitus without complication ([place])   Cancer of head of pancreas ([place])   Metastatic cancer ([place])   Immunocompromised patient ([place])   Malnutrition of moderate degree   GERD (gastroesophageal reflux disease)   Hyperbilirubinemia   Hypokalemia   Sepsis possibly due to biliary system infection: Patient has abnormal liver function with increased total bilirubin plus abdominal ultrasound findings, consistent with biliary system infection. Patient meets criteria for sepsis with WBC 3.2, fever and tachycardia. Lactate is normal. Hemodynamically stable currently. Of note, pt had hx of blood cultures positive for gram-positive cocci--viridans streptococcus.   - will admit to tele bed - Cefepime was started in ED, will continue now - PRN Zofran for nausea, morphine for pain - Blood cultures x 2  - will get Procalcitonin and trend lactic acid levels per sepsis protocol. - IVF: 2.5 L of NS bolus in ED, followed by 75 cc/h - Oncology, Dr. Jonette Eva and GI, Dr. [name]. Dr. [name] will see pt in AM. - F/u respiratory virus panel which was ordered by EDP. - prn ibuprofen for  fever  Hypokalemia: K= 3.4 on admission. - Repleted  DM-II: Last A1c 7.0 on 09/19/15, well controled. 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 [name].  Patient is on chemotherapy, last dose was 3 weeks ago. CA-19-9 was 283 on 10/10/16 -f/u with Dr. [name]  Malnutrition of moderate degree: -Nutrition consult  GERD: -Protonix  DVT ppx: SCD Code Status: Partial Code (OK for CPR, but not intubation) Family Communication:  Yes, patient's wife  at bed side Disposition Plan:  Anticipate discharge back to previous home environment Consults called:   [place] Admission status: Inpatient/tele   Date of Service 10/12/2016    Ailea Rhatigan, [facility] Pager [phone]  If 7PM-7AM, please contact night-coverage [email or web address] Password [email or web address] 10/12/2016, 8:16 PM

## 2016-10-13 ENCOUNTER — Inpatient Hospital Stay (HOSPITAL_COMMUNITY): Payer: Medicare Other

## 2016-10-13 LAB — COMPREHENSIVE METABOLIC PANEL
ALT: 155 U/L — AB (ref 17–63)
AST: 117 U/L — ABNORMAL HIGH (ref 15–41)
Albumin: 3 g/dL — ABNORMAL LOW (ref 3.5–5.0)
Alkaline Phosphatase: 798 U/L — ABNORMAL HIGH (ref 38–126)
Anion gap: 6 (ref 5–15)
BUN: 11 mg/dL (ref 6–20)
CHLORIDE: 108 mmol/L (ref 101–111)
CO2: 25 mmol/L (ref 22–32)
CREATININE: 0.59 mg/dL — AB (ref 0.61–1.24)
Calcium: 8.4 mg/dL — ABNORMAL LOW (ref 8.9–10.3)
GFR calc non Af Amer: >60 mL/min (ref >60)
Glucose, Bld: 133 mg/dL — ABNORMAL HIGH (ref 65–99)
POTASSIUM: 3.8 mmol/L (ref 3.5–5.1)
SODIUM: 139 mmol/L (ref 135–145)
Total Bilirubin: 4.8 mg/dL — ABNORMAL HIGH (ref 0.3–1.2)
Total Protein: 6 g/dL — ABNORMAL LOW (ref 6.5–8.1)

## 2016-10-13 LAB — CBC
HEMATOCRIT: 31.1 % — AB (ref 39.0–52.0)
HEMOGLOBIN: 10.7 g/dL — AB (ref 13.0–17.0)
MCH: 31.8 pg (ref 26.0–34.0)
MCHC: 34.4 g/dL (ref 30.0–36.0)
MCV: 92.3 fL (ref 78.0–100.0)
Platelets: 141 10*3/uL — ABNORMAL LOW (ref 150–400)
RBC: 3.37 MIL/uL — AB (ref 4.22–5.81)
RDW: 14.8 % (ref 11.5–15.5)
WBC: 6.9 10*3/uL (ref 4.0–10.5)

## 2016-10-13 LAB — RESPIRATORY PANEL BY PCR
Adenovirus: NOT DETECTED
BORDETELLA PERTUSSIS-RVPCR: NOT DETECTED
CORONAVIRUS 229E-RVPPCR: NOT DETECTED
CORONAVIRUS HKU1-RVPPCR: NOT DETECTED
CORONAVIRUS OC43-RVPPCR: NOT DETECTED
Chlamydophila pneumoniae: NOT DETECTED
Coronavirus NL63: NOT DETECTED
INFLUENZA B-RVPPCR: NOT DETECTED
Influenza A: NOT DETECTED
MYCOPLASMA PNEUMONIAE-RVPPCR: NOT DETECTED
Metapneumovirus: NOT DETECTED
PARAINFLUENZA VIRUS 1-RVPPCR: NOT DETECTED
Parainfluenza Virus 2: NOT DETECTED
Parainfluenza Virus 3: NOT DETECTED
Parainfluenza Virus 4: NOT DETECTED
RESPIRATORY SYNCYTIAL VIRUS-RVPPCR: NOT DETECTED
Rhinovirus / Enterovirus: NOT DETECTED

## 2016-10-13 LAB — GLUCOSE, CAPILLARY
GLUCOSE-CAPILLARY: 90 mg/dL (ref 65–99)
Glucose-Capillary: 68 mg/dL (ref 65–99)
Glucose-Capillary: 123 mg/dL — ABNORMAL HIGH (ref 65–99)
Glucose-Capillary: 118 mg/dL — ABNORMAL HIGH (ref 65–99)
Glucose-Capillary: 300 mg/dL — ABNORMAL HIGH (ref 65–99)

## 2016-10-13 MED ORDER — IOPAMIDOL (ISOVUE-300) INJECTION 61%
INTRAVENOUS | Status: AC
  Filled 2016-10-13: qty 30

## 2016-10-13 MED ORDER — IOPAMIDOL (ISOVUE-300) INJECTION 61%
100.0000 mL | Freq: Once | INTRAVENOUS | Status: AC | PRN
  Administered 2016-10-13: 100 mL via INTRAVENOUS

## 2016-10-13 MED ORDER — IOPAMIDOL (ISOVUE-300) INJECTION 61%
INTRAVENOUS | Status: AC
  Filled 2016-10-13: qty 100

## 2016-10-13 MED ORDER — DEXTROSE-NACL 5-0.45 % IV SOLN
INTRAVENOUS | Status: DC
  Administered 2016-10-13 – 2016-10-14 (×3): via INTRAVENOUS

## 2016-10-13 MED ORDER — DEXTROSE 50 % IV SOLN
INTRAVENOUS | Status: AC
  Filled 2016-10-13: qty 50

## 2016-10-13 MED ORDER — IOPAMIDOL (ISOVUE-300) INJECTION 61%
30.0000 mL | Freq: Once | INTRAVENOUS | Status: AC | PRN
  Administered 2016-10-13: 30 mL via ORAL

## 2016-10-13 MED ORDER — DEXTROSE 50 % IV SOLN
25.0000 mL | Freq: Once | INTRAVENOUS | Status: AC
  Administered 2016-10-13: 25 mL via INTRAVENOUS

## 2016-10-13 NOTE — Progress Notes (Signed)
Hypoglycemic Event  CBG: 53  Treatment: 15 GM carbohydrate snack  Symptoms: None  Follow-up CBG: Time:2202 CBG Result:79  Possible Reasons for Event: Inadequate meal intake  Comments/MD notified: hypoglycemic protocol followed    Dimple Nanas

## 2016-10-13 NOTE — Progress Notes (Signed)
PROGRESS NOTE  Jacob Hardin  [id number] DOB: 07-18-1946 DOA: 10/12/2016 PCP: [name], MD  Outpatient Specialists: Dr [name], gastroenterology  Brief Narrative: Jacob TOLER is a 70 y.o. 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 [name], (had placement of a percutaneous internal/external biliary drain 11/25/2015; biliary drainage catheter capped 12/02/2015; biliary drain internalized 12/13/2015), HTN, HLD, DM, and GERD, who presented 12/22 with chills. He had no other significant symptoms, but had similar presentation in February of this year that developed into sepsis. On arrival, work up was significant for abdominal U/S showing sludge with probable nonshadowing gallstones in thickened edematous gallbladder as well as dilated biliary ducts with air. WBC 3.2, lactate 1.37, negative urinalysis, potassium 3.4, creatinine 1.04, abnormal liver function with total bilirubin 5.8, ALP 871, AST 175 and ALT 173. temperature 100.2, tachycardia, oxygen saturation 97% on room air. Oncology and GI were consulted and he was admitted for treatment of sepsis due to presumed acute cholecystitis.  Assessment & Plan: Principal Problem:   Sepsis ([place]) Active Problems:   Diabetes mellitus without complication ([place])   Cancer of head of pancreas ([place])   Metastatic cancer ([place])   Immunocompromised patient ([place])   Malnutrition of moderate degree   GERD (gastroesophageal reflux disease)   Hyperbilirubinemia   Hypokalemia  Sepsis possibly due to biliary system infection: Patient has abnormal liver function with increased total bilirubin plus abdominal ultrasound findings, consistent with biliary system infection. Patient meets criteria for sepsis with WBC 3.2, fever and tachycardia. Lactate is normal. Hemodynamically stable currently. Of note, pt had hx of blood cultures  positive for gram-positive cocci--viridans streptococcus.  - Continue IVF's - Continue cefepime.  - will admit to tele bed - Blood cultures x 2  - Trend procalcitonin, lactate wnl. - Monitor WBC (corrected to normal) - Consider DC tele if no events overnight.  Biliary obstruction/acute cholecystitis: TBili 5.8 on admission, slowly rising as outpatient with evidence of GB inflammation on U/S - CT abdomen/pelvis - GI consulted - Keep NPO for now. INR 0.98 - Discussed with IR. Suspect he will need drain and doubt he is a surgical candidate at this time. Will discuss further once CT is performed.   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 [name].  Patient is on chemotherapy, last dose was 3 weeks ago, recently held for hyperbilirubinemia. CA-19-9 was 283 on 10/10/16 - Dr. Marin Olp consulted by ED -f/u with Dr. [name] as outpatient  Hypokalemia: K= 3.4 on admission. Resolved s/p repletion.  DM-II: Last A1c 7.0 on 09/19/15, well controled. Patient is taking Levemir at home -will decrease Lantus dose from 40-->30 units daily  -SSI  Malnutrition of moderate degree: -Nutrition consult  GERD: -Protonix  DVT prophylaxis: SCDs Code Status: Partial code, DNI, otherwise all resuscitative measures.  Family Communication: Wife at bedside Disposition Plan: Continued treatment of sepsis, pending GI evaluation/procedure.   Consultants:   Gastroenterology, Dr. [name]  Oncology   Interventional radiology  Procedures:   None  Antimicrobials:  Cefepime 12/22 >>    Subjective: Pt without chills at the moment. Some itchiness. No fevers, abd pain, N/V/D, chest pain or palpitations.   Objective: Vitals:   10/12/16 2030 10/12/16 2132 10/12/16 2300 10/13/16 0456  BP: 139/67 130/64    Pulse: 88 87  74  Resp:  18  18  Temp:  100 F (37.8  C)  97.8 F (36.6 C)  TempSrc:  Oral  Oral  SpO2: 95% 98%    Weight:   73.6 kg  (162 lb 4.1 oz)   Height:   6' (1.829 m)     Intake/Output Summary (Last 24 hours) at 10/13/16 1331 Last data filed at 10/13/16 0600  Gross per 24 hour  Intake          1456.25 ml  Output                0 ml  Net          1456.25 ml   Filed Weights   10/12/16 2300  Weight: 73.6 kg (162 lb 4.1 oz)    Examination: General exam: 70 y.o. male in no distress Respiratory system: Non-labored breathing room air. Clear to auscultation bilaterally.  Cardiovascular system: Regular rate and rhythm. No murmur, rub, or gallop. No JVD, and no pedal edema. Gastrointestinal system: Abdomen soft, non-tender, non-distended, with normoactive bowel sounds. No organomegaly or masses felt. Central nervous system: Alert and oriented. No focal neurological deficits. Extremities: Warm, no deformities Skin: No rashes. Right upper chest port c/d/i without erythema Psychiatry: Judgement and insight appear normal. Mood & affect appropriate.   Data Reviewed: I have personally reviewed following labs and imaging studies  CBC:  Recent Labs Lab 10/10/16 0742 10/12/16 1612 10/13/16 0440  WBC 3.9* 3.2* 6.9  NEUTROABS 2.4 2.8  --   HGB 10.9* 10.1* 10.7*  HCT 32.5* 29.9* 31.1*  MCV 93.1 92.6 92.3  PLT 143 128* [id number]*   Basic Metabolic Panel:  Recent Labs Lab 10/10/16 0742 10/12/16 1612 10/13/16 0440  NA 136 134* 139  K 3.7 3.4* 3.8  CL  --  101 108  CO2 27 24 25   GLUCOSE 219* 350* 133*  BUN 11.5 17 11   CREATININE 0.8 1.04 0.59*  CALCIUM 9.0 8.3* 8.4*   GFR: Estimated Creatinine Clearance: 92 mL/min (by C-G formula based on SCr of 0.59 mg/dL (L)). Liver Function Tests:  Recent Labs Lab 10/10/16 0742 10/12/16 1612 10/13/16 0440  AST 173* 135* 117*  ALT 204* 173* 155*  ALKPHOS 1,064* 871* 798*  BILITOT 2.18* 5.8* 4.8*  PROT 6.6 6.1* 6.0*  ALBUMIN 3.1* 3.0* 3.0*    Recent Labs Lab 10/12/16 1612  LIPASE <10*  AMYLASE 42    Recent Labs Lab 10/12/16 1612  AMMONIA 43*    Coagulation Profile:  Recent Labs Lab 10/12/16 2021  INR 0.98   Cardiac Enzymes: No results for input(s): CKTOTAL, CKMB, CKMBINDEX, TROPONINI in the last 168 hours. BNP (last 3 results) No results for input(s): PROBNP in the last 8760 hours. HbA1C: No results for input(s): HGBA1C in the last 72 hours. CBG:  Recent Labs Lab 10/12/16 2130 10/12/16 2202 10/13/16 0758 10/13/16 0903 10/13/16 1159  GLUCAP 53* 79 68 123* 90   Lipid Profile: No results for input(s): CHOL, HDL, LDLCALC, TRIG, CHOLHDL, LDLDIRECT in the last 72 hours. Thyroid Function Tests: No results for input(s): TSH, T4TOTAL, FREET4, T3FREE, THYROIDAB in the last 72 hours. Anemia Panel: No results for input(s): VITAMINB12, FOLATE, FERRITIN, TIBC, IRON, RETICCTPCT in the last 72 hours. Urine analysis:    Component Value Date/Time   COLORURINE AMBER (A) 10/12/2016 1540   APPEARANCEUR CLEAR 10/12/2016 1540   LABSPEC 1.035 (H) 10/12/2016 1540   PHURINE 5.0 10/12/2016 1540   GLUCOSEU >=500 (A) 10/12/2016 1540   HGBUR SMALL (A) 10/12/2016 1540   BILIRUBINUR MODERATE (A) 10/12/2016 1540   KETONESUR NEGATIVE  10/12/2016 1540   PROTEINUR 30 (A) 10/12/2016 1540   NITRITE NEGATIVE 10/12/2016 1540   LEUKOCYTESUR NEGATIVE 10/12/2016 1540   Sepsis Labs: @LABRCNTIP (procalcitonin:4,lacticidven:4)  ) Recent Results (from the past 240 hour(s))  Respiratory Panel by PCR     Status: None   Collection Time: 10/12/16  7:00 PM  Result Value Ref Range Status   Adenovirus NOT DETECTED NOT DETECTED Final   Coronavirus 229E NOT DETECTED NOT DETECTED Final   Coronavirus HKU1 NOT DETECTED NOT DETECTED Final   Coronavirus NL63 NOT DETECTED NOT DETECTED Final   Coronavirus OC43 NOT DETECTED NOT DETECTED Final   Metapneumovirus NOT DETECTED NOT DETECTED Final   Rhinovirus / Enterovirus NOT DETECTED NOT DETECTED Final   Influenza A NOT DETECTED NOT DETECTED Final   Influenza B NOT DETECTED NOT DETECTED Final   Parainfluenza  Virus 1 NOT DETECTED NOT DETECTED Final   Parainfluenza Virus 2 NOT DETECTED NOT DETECTED Final   Parainfluenza Virus 3 NOT DETECTED NOT DETECTED Final   Parainfluenza Virus 4 NOT DETECTED NOT DETECTED Final   Respiratory Syncytial Virus NOT DETECTED NOT DETECTED Final   Bordetella pertussis NOT DETECTED NOT DETECTED Final   Chlamydophila pneumoniae NOT DETECTED NOT DETECTED Final   Mycoplasma pneumoniae NOT DETECTED NOT DETECTED Final    Comment: Performed at [facility]     Radiology Studies: Dg Chest 2 View  Result Date: 10/12/2016 CLINICAL DATA:  Fever, chills, pancreatic cancer EXAM: CHEST  2 VIEW COMPARISON:  01/09/2016 FINDINGS: Lungs are clear.  No pleural effusion or pneumothorax. The heart is normal in size. Right chest port terminates in the lower SVC. IMPRESSION: No evidence of acute cardiopulmonary disease. Electronically Signed   By: Julian Hy M.D.   On: 10/12/2016 19:53    Scheduled Meds: . ceFEPime (MAXIPIME) IV  2 g Intravenous BID  . dextrose      . insulin aspart  0-9 Units Subcutaneous TID WC  . insulin detemir  30 Units Subcutaneous Daily  . lipase/protease/amylase  36,000 Units Oral TID AC  . loratadine  10 mg Oral Daily  . multivitamin with minerals  1 tablet Oral Daily  . pantoprazole  40 mg Oral Daily  . sodium chloride flush  3 mL Intravenous Q12H   Continuous Infusions: . dextrose 5 % and 0.45% NaCl 75 mL/hr at 10/13/16 1255     LOS: 1 day   Time spent: 25 minutes.  Vance Gather, MD [facility] Pager [phone]  If 7PM-7AM, please contact night-coverage [email or web address] Password [email or web address] 10/13/2016, 1:31 PM

## 2016-10-13 NOTE — Progress Notes (Signed)
Hypoglycemic Event  CBG: 68  Treatment: D50 IV 25 mL  Symptoms: Sweaty  Follow-up CBG: Time: 0903 CBG Result: 123  Possible Reasons for Event: Inadequate meal intake- Pt NPO  Comments/MD notified: Will make MD aware when he rounds on patient, and request to change IVF to possibly D5 if NPO status continues    [employer]

## 2016-10-13 NOTE — Consult Note (Signed)
Reason for Consult: Jaundice, fever, chills, acute cholecystitis Referring Physician: [facility]  Jacob Hardin HPI: This is a 70 year old male diagnosed with metastatic pancreatic cancer on 11/18/2015 admitted for elevated TB, subjective fever, chills, and pruritis.  His pruritis started on 10/03/2016, but his TB was normal at that time.  During his follow up appointment on 10/10/2016 his TB increased into the 2 range and his pruritis worsened.  A RUQ U/S was obtained on 10/10/2016 and it revealed an early acute cholecystitis.  On 12/2015 he was hospitalized for fever and the blood culture was positive for Strep viridans.  During the initial evaluation an ERCP was attempted, but it failed as the pancreatic cancer invaded into the duodenal lumen with resultant occlusion.  The procedure was converted to an EUS and then on 11/22/2015 an EGD with duodenal stent placement was performed.  He also underwent IR stent placement.  His WBC during this admission was low and he did not have a fever.  Past Medical History:  Diagnosis Date  . Hyperlipidemia   . Hypertension    recently weight lost-no meds now-running low.  Marland Kitchen PONV (postoperative nausea and vomiting)   . Primary pancreatic adenocarcinoma ([facility])    mets to liver and duodenum  . Skin cancer    "burned off my face & head"  . Type II diabetes mellitus ([place])     Past Surgical History:  Procedure Laterality Date  . ANTERIOR CERVICAL DECOMP/DISCECTOMY FUSION  1994   cervical-bone graft  . BACK SURGERY    . COLONOSCOPY    . DUODENAL STENT PLACEMENT N/A 11/22/2015   Procedure: DUODENAL STENT PLACEMENT;  Surgeon: Carol Ada, MD;  Location: [place];  Service: Endoscopy;  Laterality: N/A;  . ESOPHAGOGASTRODUODENOSCOPY N/A 11/18/2015   Procedure: ESOPHAGOGASTRODUODENOSCOPY (EGD);  Surgeon: Carol Ada, MD;  Location: [facility] ENDOSCOPY;  Service: Endoscopy;  Laterality: N/A;  . ESOPHAGOGASTRODUODENOSCOPY N/A 11/22/2015   Procedure:  ESOPHAGOGASTRODUODENOSCOPY (EGD);  Surgeon: Carol Ada, MD;  Location: [facility] END[facility]COPY;  Service: Endoscopy;  Laterality: N/A;  Uncovered duodenal stent placement.  Fluoroscopy required.  . EUS  11/18/2015   Procedure: UPPER ENDOSCOPIC ULTRASOUND (EUS) LINEAR;  Surgeon: Carol Ada, MD;  Location: [place];  Service: Endoscopy;;  . INGUINAL HERNIA REPAIR Left   . SHOULDER ARTHROSCOPY W/ ROTATOR CUFF REPAIR Left 2014  . TONSILLECTOMY    . WISDOM TOOTH EXTRACTION      Family History  Problem Relation Age of Onset  . Diabetes Mother   . Hypertension Mother   . Cancer Father     lung 34  . Hypertension Father   . Cancer Brother     pancreatic   . Diabetes Brother   . Cancer Paternal Grandfather     colon  . Cancer Paternal Uncle     prostate    Social History:  reports that he has never smoked. He has never used smokeless tobacco. He reports that he does not drink alcohol or use drugs.  Allergies:  Allergies  Allergen Reactions  . Codeine Nausea Only    Medications:  Scheduled: . ceFEPime (MAXIPIME) IV  2 g Intravenous BID  . dextrose      . insulin aspart  0-9 Units Subcutaneous TID WC  . insulin detemir  30 Units Subcutaneous Daily  . iopamidol      . lipase/protease/amylase  36,000 Units Oral TID AC  . loratadine  10 mg Oral Daily  . multivitamin with minerals  1 tablet Oral Daily  .  pantoprazole  40 mg Oral Daily  . sodium chloride flush  3 mL Intravenous Q12H   Continuous: . dextrose 5 % and 0.45% NaCl 75 mL/hr at 10/13/16 1255    Results for orders placed or performed during the hospital encounter of 10/12/16 (from the past 24 hour(s))  Urinalysis, Routine w reflex microscopic     Status: Abnormal   Collection Time: 10/12/16  3:40 PM  Result Value Ref Range   Color, Urine AMBER (A) YELLOW   APPearance CLEAR CLEAR   Specific Gravity, Urine 1.035 (H) 1.005 - 1.030   pH 5.0 5.0 - 8.0   Glucose, UA >=500 (A) NEGATIVE mg/dL   Hgb urine dipstick SMALL (A)  NEGATIVE   Bilirubin Urine MODERATE (A) NEGATIVE   Ketones, ur NEGATIVE NEGATIVE mg/dL   Protein, ur 30 (A) NEGATIVE mg/dL   Nitrite NEGATIVE NEGATIVE   Leukocytes, UA NEGATIVE NEGATIVE   RBC / HPF 0-5 0 - 5 RBC/hpf   WBC, UA 0-5 0 - 5 WBC/hpf   Bacteria, UA NONE SEEN NONE SEEN   Squamous Epithelial / LPF NONE SEEN NONE SEEN   Mucous PRESENT    Hyaline Casts, UA PRESENT   Comprehensive metabolic panel     Status: Abnormal   Collection Time: 10/12/16  4:12 PM  Result Value Ref Range   Sodium 134 (L) 135 - 145 mmol/L   Potassium 3.4 (L) 3.5 - 5.1 mmol/L   Chloride 101 101 - 111 mmol/L   CO2 24 22 - 32 mmol/L   Glucose, Bld 350 (H) 65 - 99 mg/dL   BUN 17 6 - 20 mg/dL   Creatinine, Ser 1.04 0.61 - 1.24 mg/dL   Calcium 8.3 (L) 8.9 - 10.3 mg/dL   Total Protein 6.1 (L) 6.5 - 8.1 g/dL   Albumin 3.0 (L) 3.5 - 5.0 g/dL   AST 135 (H) 15 - 41 U/L   ALT 173 (H) 17 - 63 U/L   Alkaline Phosphatase 871 (H) 38 - 126 U/L   Total Bilirubin 5.8 (H) 0.3 - 1.2 mg/dL   GFR calc non Af Amer >60 >60 mL/min   GFR calc Af Amer >60 >60 mL/min   Anion gap 9 5 - 15  CBC with Differential     Status: Abnormal   Collection Time: 10/12/16  4:12 PM  Result Value Ref Range   WBC 3.2 (L) 4.0 - 10.5 K/uL   RBC 3.23 (L) 4.22 - 5.81 MIL/uL   Hemoglobin 10.1 (L) 13.0 - 17.0 g/dL   HCT 29.9 (L) 39.0 - 52.0 %   MCV 92.6 78.0 - 100.0 fL   MCH 31.3 26.0 - 34.0 pg   MCHC 33.8 30.0 - 36.0 g/dL   RDW 14.6 11.5 - 15.5 %   Platelets 128 (L) 150 - 400 K/uL   Neutrophils Relative % 87 %   Neutro Abs 2.8 1.7 - 7.7 K/uL   Lymphocytes Relative 5 %   Lymphs Abs 0.2 (L) 0.7 - 4.0 K/uL   Monocytes Relative 5 %   Monocytes Absolute 0.2 0.1 - 1.0 K/uL   Eosinophils Relative 2 %   Eosinophils Absolute 0.1 0.0 - 0.7 K/uL   Basophils Relative 1 %   Basophils Absolute 0.0 0.0 - 0.1 K/uL  Ammonia     Status: Abnormal   Collection Time: 10/12/16  4:12 PM  Result Value Ref Range   Ammonia 43 (H) 9 - 35 umol/L  Bilirubin,  direct     Status: Abnormal  Collection Time: 10/12/16  4:12 PM  Result Value Ref Range   Bilirubin, Direct 3.8 (H) 0.1 - 0.5 mg/dL  Lipase, blood     Status: Abnormal   Collection Time: 10/12/16  4:12 PM  Result Value Ref Range   Lipase <10 (L) 11 - 51 U/L  Amylase     Status: None   Collection Time: 10/12/16  4:12 PM  Result Value Ref Range   Amylase 42 28 - 100 U/L  I-Stat CG4 Lactic Acid, ED     Status: None   Collection Time: 10/12/16  4:21 PM  Result Value Ref Range   Lactic Acid, Venous 1.37 0.5 - 1.9 mmol/L  Respiratory Panel by PCR     Status: None   Collection Time: 10/12/16  7:00 PM  Result Value Ref Range   Adenovirus NOT DETECTED NOT DETECTED   Coronavirus 229E NOT DETECTED NOT DETECTED   Coronavirus HKU1 NOT DETECTED NOT DETECTED   Coronavirus NL63 NOT DETECTED NOT DETECTED   Coronavirus OC43 NOT DETECTED NOT DETECTED   Metapneumovirus NOT DETECTED NOT DETECTED   Rhinovirus / Enterovirus NOT DETECTED NOT DETECTED   Influenza A NOT DETECTED NOT DETECTED   Influenza B NOT DETECTED NOT DETECTED   Parainfluenza Virus 1 NOT DETECTED NOT DETECTED   Parainfluenza Virus 2 NOT DETECTED NOT DETECTED   Parainfluenza Virus 3 NOT DETECTED NOT DETECTED   Parainfluenza Virus 4 NOT DETECTED NOT DETECTED   Respiratory Syncytial Virus NOT DETECTED NOT DETECTED   Bordetella pertussis NOT DETECTED NOT DETECTED   Chlamydophila pneumoniae NOT DETECTED NOT DETECTED   Mycoplasma pneumoniae NOT DETECTED NOT DETECTED  Lactic acid, plasma     Status: None   Collection Time: 10/12/16  8:21 PM  Result Value Ref Range   Lactic Acid, Venous 1.5 0.5 - 1.9 mmol/L  Procalcitonin     Status: None   Collection Time: 10/12/16  8:21 PM  Result Value Ref Range   Procalcitonin 6.70 ng/mL  Protime-INR     Status: None   Collection Time: 10/12/16  8:21 PM  Result Value Ref Range   Prothrombin Time 13.0 11.4 - 15.2 seconds   INR 0.98   APTT     Status: None   Collection Time: 10/12/16  8:21 PM   Result Value Ref Range   aPTT 30 24 - 36 seconds  Glucose, capillary     Status: Abnormal   Collection Time: 10/12/16  9:30 PM  Result Value Ref Range   Glucose-Capillary 53 (L) 65 - 99 mg/dL  Glucose, capillary     Status: None   Collection Time: 10/12/16 10:02 PM  Result Value Ref Range   Glucose-Capillary 79 65 - 99 mg/dL  Lactic acid, plasma     Status: None   Collection Time: 10/12/16 11:02 PM  Result Value Ref Range   Lactic Acid, Venous 1.9 0.5 - 1.9 mmol/L  Comprehensive metabolic panel     Status: Abnormal   Collection Time: 10/13/16  4:40 AM  Result Value Ref Range   Sodium 139 135 - 145 mmol/L   Potassium 3.8 3.5 - 5.1 mmol/L   Chloride 108 101 - 111 mmol/L   CO2 25 22 - 32 mmol/L   Glucose, Bld 133 (H) 65 - 99 mg/dL   BUN 11 6 - 20 mg/dL   Creatinine, Ser 0.59 (L) 0.61 - 1.24 mg/dL   Calcium 8.4 (L) 8.9 - 10.3 mg/dL   Total Protein 6.0 (L) 6.5 - 8.1 g/dL  Albumin 3.0 (L) 3.5 - 5.0 g/dL   AST 117 (H) 15 - 41 U/L   ALT 155 (H) 17 - 63 U/L   Alkaline Phosphatase 798 (H) 38 - 126 U/L   Total Bilirubin 4.8 (H) 0.3 - 1.2 mg/dL   GFR calc non Af Amer >60 >60 mL/min   GFR calc Af Amer >60 >60 mL/min   Anion gap 6 5 - 15  CBC     Status: Abnormal   Collection Time: 10/13/16  4:40 AM  Result Value Ref Range   WBC 6.9 4.0 - 10.5 K/uL   RBC 3.37 (L) 4.22 - 5.81 MIL/uL   Hemoglobin 10.7 (L) 13.0 - 17.0 g/dL   HCT 31.1 (L) 39.0 - 52.0 %   MCV 92.3 78.0 - 100.0 fL   MCH 31.8 26.0 - 34.0 pg   MCHC 34.4 30.0 - 36.0 g/dL   RDW 14.8 11.5 - 15.5 %   Platelets 141 (L) 150 - 400 K/uL  Glucose, capillary     Status: None   Collection Time: 10/13/16  7:58 AM  Result Value Ref Range   Glucose-Capillary 68 65 - 99 mg/dL  Glucose, capillary     Status: Abnormal   Collection Time: 10/13/16  9:03 AM  Result Value Ref Range   Glucose-Capillary 123 (H) 65 - 99 mg/dL     Dg Chest 2 View  Result Date: 10/12/2016 CLINICAL DATA:  Fever, chills, pancreatic cancer EXAM: CHEST  2  VIEW COMPARISON:  01/09/2016 FINDINGS: Lungs are clear.  No pleural effusion or pneumothorax. The heart is normal in size. Right chest port terminates in the lower SVC. IMPRESSION: No evidence of acute cardiopulmonary disease. Electronically Signed   By: Julian Hy M.D.   On: 10/12/2016 19:53    ROS:  As stated above in the HPI otherwise negative.  Blood pressure 130/64, pulse 74, temperature 97.8 F (36.6 C), temperature source Oral, resp. rate 18, height 6' (1.829 m), weight 73.6 kg (162 lb 4.1 oz), SpO2 98 %.    PE: Gen: NAD, Alert and Oriented HEENT:  [place]/AT, EOMI Neck: Supple, no LAD Lungs: CTA Bilaterally CV: RRR without M/G/R ABM: Soft, NTND, +BS Ext: No C/C/E  Assessment/Plan: 1) Acute cholecystitis, per [place] report. 2) Elevated TB. 3) Metastatic pancreatic cancer.   I believe his symptoms are secondary to an acute cholecystitis.  Clinically he appears much better than anticipated for his disease.  A CT scan was ordered and hopefully this will help to elucidate the issue of cholecystitis and the status of his biliary stent.  Plan: 1) Continue with Cefepime.  He is feeling better.  No reports of any further fevers or chills. 2) Await CT scan results.  If there is biliary obstruction, IR will need to be consulted. 3) If there is cholecystitis, surgery needs to be consulted about further management of this issue in this clinical setting. Magnus Crescenzo D 10/13/2016, 10:19 AM

## 2016-10-14 ENCOUNTER — Encounter (HOSPITAL_COMMUNITY)
Admission: EM | Disposition: A | Discharge status: Home Health | Payer: Self-pay | Source: Home / Self Care | Attending: Family Medicine

## 2016-10-14 ENCOUNTER — Encounter (HOSPITAL_COMMUNITY): Payer: Self-pay

## 2016-10-14 HISTORY — PX: ESOPHAGOGASTRODUODENOSCOPY: SHX5428

## 2016-10-14 LAB — COMPREHENSIVE METABOLIC PANEL
ALBUMIN: 2.8 g/dL — AB (ref 3.5–5.0)
ALK PHOS: 699 U/L — AB (ref 38–126)
ALT: 138 U/L — AB (ref 17–63)
AST: 118 U/L — AB (ref 15–41)
Anion gap: 6 (ref 5–15)
BUN: 11 mg/dL (ref 6–20)
CALCIUM: 8.4 mg/dL — AB (ref 8.9–10.3)
CO2: 26 mmol/L (ref 22–32)
CREATININE: 0.73 mg/dL (ref 0.61–1.24)
Chloride: 103 mmol/L (ref 101–111)
GFR calc Af Amer: >60 mL/min (ref >60)
GFR calc non Af Amer: >60 mL/min (ref >60)
Glucose, Bld: 226 mg/dL — ABNORMAL HIGH (ref 65–99)
Potassium: 3.3 mmol/L — ABNORMAL LOW (ref 3.5–5.1)
SODIUM: 135 mmol/L (ref 135–145)
Total Bilirubin: 5.1 mg/dL — ABNORMAL HIGH (ref 0.3–1.2)
Total Protein: 6 g/dL — ABNORMAL LOW (ref 6.5–8.1)

## 2016-10-14 LAB — GLUCOSE, CAPILLARY
GLUCOSE-CAPILLARY: 228 mg/dL — AB (ref 65–99)
GLUCOSE-CAPILLARY: 232 mg/dL — AB (ref 65–99)
Glucose-Capillary: 221 mg/dL — ABNORMAL HIGH (ref 65–99)
Glucose-Capillary: 279 mg/dL — ABNORMAL HIGH (ref 65–99)

## 2016-10-14 LAB — CBC
HCT: 29.5 % — ABNORMAL LOW (ref 39.0–52.0)
Hemoglobin: 10 g/dL — ABNORMAL LOW (ref 13.0–17.0)
MCH: 30.7 pg (ref 26.0–34.0)
MCHC: 33.9 g/dL (ref 30.0–36.0)
MCV: 90.5 fL (ref 78.0–100.0)
PLATELETS: 110 10*3/uL — AB (ref 150–400)
RBC: 3.26 MIL/uL — ABNORMAL LOW (ref 4.22–5.81)
RDW: 14.6 % (ref 11.5–15.5)
WBC: 4.1 10*3/uL (ref 4.0–10.5)

## 2016-10-14 LAB — LACTATE DEHYDROGENASE: LDH: 146 U/L (ref 98–192)

## 2016-10-14 SURGERY — EGD (ESOPHAGOGASTRODUODENOSCOPY)
Anesthesia: Moderate Sedation

## 2016-10-14 MED ORDER — FENTANYL CITRATE (PF) 100 MCG/2ML IJ SOLN
INTRAMUSCULAR | Status: AC
  Filled 2016-10-14: qty 2

## 2016-10-14 MED ORDER — DIPHENHYDRAMINE HCL 50 MG/ML IJ SOLN
INTRAMUSCULAR | Status: AC
  Filled 2016-10-14: qty 1

## 2016-10-14 MED ORDER — FENTANYL CITRATE (PF) 100 MCG/2ML IJ SOLN
INTRAMUSCULAR | Status: DC | PRN
  Administered 2016-10-14 (×2): 25 ug via INTRAVENOUS

## 2016-10-14 MED ORDER — MIDAZOLAM HCL 10 MG/2ML IJ SOLN
INTRAMUSCULAR | Status: DC | PRN
  Administered 2016-10-14 (×2): 2 mg via INTRAVENOUS

## 2016-10-14 MED ORDER — MIDAZOLAM HCL 5 MG/ML IJ SOLN
INTRAMUSCULAR | Status: AC
  Filled 2016-10-14: qty 2

## 2016-10-14 NOTE — Op Note (Signed)
[facility] Patient Name: Jacob Hardin Procedure Date: 10/14/2016 MRN: [id number] Attending MD: Carol Ada , MD Date of Birth: 1946-07-18 CSN: [id number] Age: 70 Admit Type: Inpatient Procedure:                Upper GI endoscopy Indications:              Abnormal CT of the GI tract Providers:                Carol Ada, MD, Laverta [place] RN, RN, Cherylynn Ridges, Technician Referring MD:              Medicines:                Midazolam 4 mg IV, Fentanyl 50 micrograms IV Complications:            No immediate complications. Estimated Blood Loss:     Estimated blood loss was minimal. Procedure:                Pre-Anesthesia Assessment:                           - Prior to the procedure, a History and Physical                            was performed, and patient medications and                            allergies were reviewed. The patient's tolerance of                            previous anesthesia was also reviewed. The risks                            and benefits of the procedure and the sedation                            options and risks were discussed with the patient.                            All questions were answered, and informed consent                            was obtained. Prior Anticoagulants: The patient has                            taken no previous anticoagulant or antiplatelet                            agents. ASA Grade Assessment: III - A patient with                            severe systemic disease. After reviewing the risks  and benefits, the patient was deemed in                            satisfactory condition to undergo the procedure.                           - Sedation was administered by an endoscopy nurse.                            The sedation level attained was moderate.                           After obtaining informed consent, the endoscope was           passed under direct vision. Throughout the                            procedure, the patient's blood pressure, pulse, and                            oxygen saturations were monitored continuously. The                            [id number] scope was introduced through the                            mouth, and advanced to the second part of duodenum.                            The [id number] scope was introduced                            through the and advanced to the. The upper GI                            endoscopy was accomplished without difficulty. The                            patient tolerated the procedure well. Scope In: Scope Out: Findings:      The esophagus was normal.      The stomach was normal.      An acquired extrinsic moderate stenosis was found in the duodenal bulb       and was traversed.      The upper endoscope was able to pass through the duodenal stent without       significant difficulty. The biliary stent extended into the duodenal       lumen and there was a significant amount of debris in the duodenal       stent. A duodenoscope was then used in an attempt to plan for an ERCP in       the near future. Unfortunately, the duodenoscope was not able to pass       through the duodenal bulb lumen. Impression:               - Normal esophagus.                           -  Normal stomach.                           - Acquired duodenal stenosis.                           - No specimens collected. Moderate Sedation:      Moderate (conscious) sedation was administered by the endoscopy nurse       and supervised by the endoscopist. The following parameters were       monitored: oxygen saturation, heart rate, blood pressure, and response       to care. Recommendation:           - Return patient to hospital ward for ongoing care.                           - Resume regular diet.                           - Continue present medications.                            - I will discuss the case with IR. Procedure Code(s):        --- Professional ---                           [phone], Esophagogastroduodenoscopy, flexible,                            transoral; diagnostic, including collection of                            specimen(s) by brushing or washing, when performed                            (separate procedure) Diagnosis Code(s):        --- Professional ---                           K31.5, Obstruction of duodenum                           R93.3, Abnormal findings on diagnostic imaging of                            other parts of digestive tract CPT copyright 2016 [employer]. All rights reserved. The codes documented in this report are preliminary and upon coder review may  be revised to meet current compliance requirements. Carol Ada, MD Carol Ada, MD 10/14/2016 4:14:27 PM This report has been signed electronically. Number of Addenda: 0

## 2016-10-14 NOTE — Progress Notes (Signed)
I went over in detail with radiology about the CT scan.  There was mention of some debris in the stent, but this does not appear to be significantly changed.  There is a noted mild increase in the central intrahepatic ducts, but there is no further ductal dilation.  I discussed these findings with the patient and his wife.  I will pursue an EGD to further evaluate the stents.

## 2016-10-14 NOTE — Progress Notes (Signed)
Nutrition Brief Note  RD consulted for nutritional assessment.  Spoke with patient and pt's wife who both state the patient has a good appetite and has not had any issues with appetite for a while. He has had weight loss but it has since ceased and he is starting to gain weight back. Pt currently NPO for pending EGD. Ate 100% of breakfast meal this morning prior to being NPO. Pt does not use protein supplements at home d/t eating well.  Wt Readings from Last 15 Encounters:  10/12/16 162 lb 4.1 oz (73.6 kg)  10/10/16 160 lb 4.8 oz (72.7 kg)  09/19/16 163 lb (73.9 kg)  08/22/16 161 lb (73 kg)  08/01/16 160 lb 4.8 oz (72.7 kg)  07/11/16 159 lb 8 oz (72.3 kg)  06/19/16 155 lb 6.4 oz (70.5 kg)  06/13/16 152 lb 4.8 oz (69.1 kg)  05/21/16 150 lb 6.4 oz (68.2 kg)  05/02/16 154 lb 6.4 oz (70 kg)  04/11/16 152 lb 9.6 oz (69.2 kg)  03/28/16 150 lb 12.8 oz (68.4 kg)  03/14/16 155 lb 9.6 oz (70.6 kg)  02/29/16 156 lb 12.8 oz (71.1 kg)  02/15/16 155 lb 11.2 oz (70.6 kg)    Body mass index is 22.01 kg/m. Patient meets criteria for normal based on current BMI.   Current diet order is NPO but was on regular diet, patient is consuming approximately 100% of meals at this time. Labs and medications reviewed.   No nutrition interventions warranted at this time. If nutrition issues arise, please consult RD.   Clayton Bibles, MS, RD, LDN Pager: [phone] After Hours Pager: [phone]

## 2016-10-14 NOTE — Progress Notes (Signed)
PROGRESS NOTE  Jacob Hardin  [id number] DOB: 04-08-1946 DOA: 10/12/2016 PCP: [name], MD  Outpatient Specialists: Dr [name], gastroenterology  Brief Narrative: Jacob Hardin is a 70 y.o. 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 [name], (had placement of a percutaneous internal/external biliary drain 11/25/2015; biliary drainage catheter capped 12/02/2015; biliary drain internalized 12/13/2015), HTN, HLD, DM, and GERD, who presented 12/22 with chills. He had no other significant symptoms, but had similar presentation in February of this year that developed into sepsis. On arrival, work up was significant for abdominal U/S showing sludge with probable nonshadowing gallstones in thickened edematous gallbladder as well as dilated biliary ducts with air. WBC 3.2, lactate 1.37, negative urinalysis, potassium 3.4, creatinine 1.04, abnormal liver function with total bilirubin 5.8, ALP 871, AST 175 and ALT 173. temperature 100.2, tachycardia, oxygen saturation 97% on room air. Oncology and GI were consulted and he was admitted for treatment of sepsis due to presumed acute cholecystitis. Subsequent CT showed distended GB without evidence of biliary obstruction. EGD is planned 12/24.   Assessment & Plan: Principal Problem:   Sepsis ([place]) Active Problems:   Diabetes mellitus without complication ([place])   Cancer of head of pancreas ([place])   Metastatic cancer ([place])   Immunocompromised patient ([place])   Malnutrition of moderate degree   GERD (gastroesophageal reflux disease)   Hyperbilirubinemia   Hypokalemia  Sepsis possibly due to cholestasis: Patient has abnormal liver function with increased total bilirubin plus abdominal ultrasound findings, consistent with biliary system infection. Sepsis resolved since admission. Lactate is normal. Hemodynamically stable  currently. 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  - Continue IVF's - Continue cefepime - Blood cultures x 2  - Trend procalcitonin, lactate wnl. - Monitor WBC (corrected to normal) - DC tele  Biliary obstruction/hyperbilirubinemia: TBili 5.8 on admission, slowly rising as outpatient with evidence of GB inflammation on U/S. CT showed distended gallbladder without inflammation. No evidence of hemolysis.  - GI consulted: EGD 12/24 to evaluate stents.  - Discussed with IR: No indication for drain at this time found on CT scan, no obvious cholecystitis either. They will follow peripherally.   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 [name].  Patient is on chemotherapy, last dose was 3 weeks ago, recently held for hyperbilirubinemia. CA-19-9 was 283 on 10/10/16 - Dr. Marin Olp consulted by ED  -f/u with Dr. [name] as outpatient  Hypokalemia: K= 3.4 on admission. Resolved s/p repletion.  DM-II: Last A1c 7.0 on 09/19/15, well controled. Patient is taking Levemir at home -will decrease Lantus dose from 40-->30 units daily  -SSI  Malnutrition of moderate degree: -Nutrition consult  GERD: -Protonix  DVT prophylaxis: SCDs Code Status: Partial code, DNI, otherwise all resuscitative measures.  Family Communication: Wife at bedside Disposition Plan: Continued treatment of sepsis, pending GI evaluation/procedure.   Consultants:   Gastroenterology, Dr. [name]  Oncology   Interventional radiology  Procedures:   EGD 12/24  Antimicrobials:  Cefepime 12/22 >>    Subjective: Pt feels improved, no chills and no fever. Still jaundiced. Denies abd pain, N/V/D, ate breakfast without issues this AM.   Objective: Vitals:   10/13/16 1926 10/13/16 2130 10/14/16 0507 10/14/16 1334  BP:  (!) 158/67 (!) 163/86 (!) 155/75  Pulse:  65 71 64  Resp:  18 18 20   Temp: 98.9 F (37.2 C)  99.4 F (37.4 C) 98.7 F (37.1 C)  98.4 F (36.9 C)  TempSrc: Oral Oral Oral Oral  SpO2:  100% 97% 99%  Weight:      Height:        Intake/Output Summary (Last 24 hours) at 10/14/16 1441 Last data filed at 10/14/16 1300  Gross per 24 hour  Intake          1671.25 ml  Output                0 ml  Net          1671.25 ml   Filed Weights   10/12/16 2300  Weight: 73.6 kg (162 lb 4.1 oz)    Examination: General exam: Jaundiced 70 y.o. male in no distress Respiratory system: Non-labored breathing room air. Clear to auscultation bilaterally.  Cardiovascular system: Regular rate and rhythm. No murmur, rub, or gallop. No JVD, and no pedal edema. Gastrointestinal system: Abdomen soft, absolutely not tender, non-distended, with normoactive bowel sounds. No organomegaly or masses felt. Central nervous system: Alert and oriented. No focal neurological deficits. Extremities: Warm, no deformities Skin: No rashes. Right upper chest port c/d/i without erythema Psychiatry: Judgement and insight appear normal. Mood & affect appropriate.   Data Reviewed: I have personally reviewed following labs and imaging studies  CBC:  Recent Labs Lab 10/10/16 0742 10/12/16 1612 10/13/16 0440 10/14/16 0458  WBC 3.9* 3.2* 6.9 4.1  NEUTROABS 2.4 2.8  --   --   HGB 10.9* 10.1* 10.7* 10.0*  HCT 32.5* 29.9* 31.1* 29.5*  MCV 93.1 92.6 92.3 90.5  PLT 143 128* 141* [id number]*   Basic Metabolic Panel:  Recent Labs Lab 10/10/16 0742 10/12/16 1612 10/13/16 0440 10/14/16 0458  NA 136 134* 139 135  K 3.7 3.4* 3.8 3.3*  CL  --  101 108 103  CO2 27 24 25 26   GLUCOSE 219* 350* 133* 226*  BUN 11.5 17 11 11   CREATININE 0.8 1.04 0.59* 0.73  CALCIUM 9.0 8.3* 8.4* 8.4*   GFR: Estimated Creatinine Clearance: 92 mL/min (by C-G formula based on SCr of 0.73 mg/dL). Liver Function Tests:  Recent Labs Lab 10/10/16 0742 10/12/16 1612 10/13/16 0440 10/14/16 0458  AST 173* 135* 117* 118*  ALT 204* 173* 155* 138*  ALKPHOS 1,064* 871* 798* 699*    BILITOT 2.18* 5.8* 4.8* 5.1*  PROT 6.6 6.1* 6.0* 6.0*  ALBUMIN 3.1* 3.0* 3.0* 2.8*    Recent Labs Lab 10/12/16 1612  LIPASE <10*  AMYLASE 42    Recent Labs Lab 10/12/16 1612  AMMONIA 43*   Coagulation Profile:  Recent Labs Lab 10/12/16 2021  INR 0.98   Cardiac Enzymes: No results for input(s): CKTOTAL, CKMB, CKMBINDEX, TROPONINI in the last 168 hours. BNP (last 3 results) No results for input(s): PROBNP in the last 8760 hours. HbA1C: No results for input(s): HGBA1C in the last 72 hours. CBG:  Recent Labs Lab 10/13/16 1159 10/13/16 1723 10/13/16 2120 10/14/16 0745 10/14/16 1138  GLUCAP 90 118* 300* 221* 279*   Lipid Profile: No results for input(s): CHOL, HDL, LDLCALC, TRIG, CHOLHDL, LDLDIRECT in the last 72 hours. Thyroid Function Tests: No results for input(s): TSH, T4TOTAL, FREET4, T3FREE, THYROIDAB in the last 72 hours. Anemia Panel: No results for input(s): VITAMINB12, FOLATE, FERRITIN, TIBC, IRON, RETICCTPCT in the last 72 hours. Urine analysis:    Component Value Date/Time   COLORURINE AMBER (A) 10/12/2016 1540   APPEARANCEUR CLEAR 10/12/2016 1540   LABSPEC 1.035 (H) 10/12/2016 1540  PHURINE 5.0 10/12/2016 1540   GLUCOSEU >=500 (A) 10/12/2016 1540   HGBUR SMALL (A) 10/12/2016 1540   BILIRUBINUR MODERATE (A) 10/12/2016 1540   KETONESUR NEGATIVE 10/12/2016 1540   PROTEINUR 30 (A) 10/12/2016 1540   NITRITE NEGATIVE 10/12/2016 1540   LEUKOCYTESUR NEGATIVE 10/12/2016 1540   Sepsis Labs: @LABRCNTIP (procalcitonin:4,lacticidven:4)  ) Recent Results (from the past 240 hour(s))  Culture, blood (x 2)     Status: None (Preliminary result)   Collection Time: 10/12/16  4:12 PM  Result Value Ref Range Status   Specimen Description PORTA CATH RIGHT  Final   Special Requests BOTTLES DRAWN AEROBIC AND ANAEROBIC 5CC  Final   Culture   Final    NO GROWTH 2 DAYS Performed at [facility]    Report Status PENDING  Incomplete  Respiratory Panel by  PCR     Status: None   Collection Time: 10/12/16  7:00 PM  Result Value Ref Range Status   Adenovirus NOT DETECTED NOT DETECTED Final   Coronavirus 229E NOT DETECTED NOT DETECTED Final   Coronavirus HKU1 NOT DETECTED NOT DETECTED Final   Coronavirus NL63 NOT DETECTED NOT DETECTED Final   Coronavirus OC43 NOT DETECTED NOT DETECTED Final   Metapneumovirus NOT DETECTED NOT DETECTED Final   Rhinovirus / Enterovirus NOT DETECTED NOT DETECTED Final   Influenza A NOT DETECTED NOT DETECTED Final   Influenza B NOT DETECTED NOT DETECTED Final   Parainfluenza Virus 1 NOT DETECTED NOT DETECTED Final   Parainfluenza Virus 2 NOT DETECTED NOT DETECTED Final   Parainfluenza Virus 3 NOT DETECTED NOT DETECTED Final   Parainfluenza Virus 4 NOT DETECTED NOT DETECTED Final   Respiratory Syncytial Virus NOT DETECTED NOT DETECTED Final   Bordetella pertussis NOT DETECTED NOT DETECTED Final   Chlamydophila pneumoniae NOT DETECTED NOT DETECTED Final   Mycoplasma pneumoniae NOT DETECTED NOT DETECTED Final    Comment: Performed at [facility]  Culture, blood (x 2)     Status: None (Preliminary result)   Collection Time: 10/12/16  8:17 PM  Result Value Ref Range Status   Specimen Description RIGHT ANTECUBITAL  Final   Special Requests BOTTLES DRAWN AEROBIC AND ANAEROBIC 5CC  Final   Culture   Final    NO GROWTH 2 DAYS Performed at [facility]    Report Status PENDING  Incomplete     Radiology Studies: Dg Chest 2 View  Result Date: 10/12/2016 CLINICAL DATA:  Fever, chills, pancreatic cancer EXAM: CHEST  2 VIEW COMPARISON:  01/09/2016 FINDINGS: Lungs are clear.  No pleural effusion or pneumothorax. The heart is normal in size. Right chest port terminates in the lower SVC. IMPRESSION: No evidence of acute cardiopulmonary disease. Electronically Signed   By: Julian Hy M.D.   On: 10/12/2016 19:53   Ct Abdomen Pelvis W Contrast  Result Date: 10/13/2016 CLINICAL DATA:  History of  pancreatic carcinoma with fever and chills EXAM: CT ABDOMEN AND PELVIS WITH CONTRAST TECHNIQUE: Multidetector CT imaging of the abdomen and pelvis was performed using the standard protocol following bolus administration of intravenous contrast. CONTRAST:  159mL ISOVUE-300 IOPAMIDOL (ISOVUE-300) INJECTION 61%, 44mL ISOVUE-300 IOPAMIDOL (ISOVUE-300) INJECTION 61% COMPARISON:  07/30/2016, 10/11/2016 FINDINGS: Lower chest: No acute abnormality. Hepatobiliary: Scattered hypodensities are again identified throughout the liver consistent with metastatic disease. The overall appearance is stable. Some differential in the enhancement of the liver is noted related to the timing of the contrast bolus. No significant biliary ductal dilatation is seen. The gallbladder is well distended.  Common bile duct stent is noted in satisfactory position. Cement soft tissue density is noted within which may represent debris. Pancreas: Fullness in the pancreatic head is again noted consistent with the patient's given clinical history of pancreatic carcinoma. Common bile duct stent courses through this area. Spleen: Normal in size without focal abnormality. Adrenals/Urinary Tract: Adrenal glands are unremarkable. Kidneys are normal, without renal calculi, focal lesion, or hydronephrosis. Bladder is unremarkable. Stomach/Bowel: Stent is noted within the duodenum proximally which appears patent. The appendix is within normal limits. Scattered diverticular change of the colon is noted without diverticulitis. No obstructive changes are seen. The CT Um and proximal portion of the ascending colon are predominately decompressed with some mild pericolonic inflammatory changes. This may represent some focal colitis. Vascular/Lymphatic: Aortic atherosclerosis. No enlarged abdominal or pelvic lymph nodes. Reproductive: Prostate is unremarkable. Other: Minimal ascites is noted along the liver. Musculoskeletal: No acute or significant osseous findings.  IMPRESSION: Changes consistent with the known history of pancreatic head mass with liver metastatic disease. Duodenal and common bile duct stents are noted in place. Changes suggestive of focal colitis within the right colon Electronically Signed   By: Inez Catalina M.D.   On: 10/13/2016 17:25    Scheduled Meds: . ceFEPime (MAXIPIME) IV  2 g Intravenous BID  . insulin aspart  0-9 Units Subcutaneous TID WC  . insulin detemir  30 Units Subcutaneous Daily  . lipase/protease/amylase  36,000 Units Oral TID AC  . loratadine  10 mg Oral Daily  . multivitamin with minerals  1 tablet Oral Daily  . pantoprazole  40 mg Oral Daily  . sodium chloride flush  3 mL Intravenous Q12H   Continuous Infusions: . dextrose 5 % and 0.45% NaCl 75 mL/hr at 10/14/16 0309     LOS: 2 days   Time spent: 25 minutes.  Vance Gather, MD [facility] Pager [phone]  If 7PM-7AM, please contact night-coverage [email or web address] Password [facility] 10/14/2016, 2:41 PM

## 2016-10-15 DIAGNOSIS — C801 Malignant (primary) neoplasm, unspecified: Secondary | ICD-10-CM

## 2016-10-15 DIAGNOSIS — K831 Obstruction of bile duct: Secondary | ICD-10-CM

## 2016-10-15 LAB — COMPREHENSIVE METABOLIC PANEL
ALBUMIN: 2.8 g/dL — AB (ref 3.5–5.0)
ALT: 147 U/L — ABNORMAL HIGH (ref 17–63)
ANION GAP: 6 (ref 5–15)
AST: 154 U/L — ABNORMAL HIGH (ref 15–41)
Alkaline Phosphatase: 753 U/L — ABNORMAL HIGH (ref 38–126)
BUN: 11 mg/dL (ref 6–20)
CO2: 26 mmol/L (ref 22–32)
Calcium: 8.7 mg/dL — ABNORMAL LOW (ref 8.9–10.3)
Chloride: 107 mmol/L (ref 101–111)
Creatinine, Ser: 0.56 mg/dL — ABNORMAL LOW (ref 0.61–1.24)
GFR calc non Af Amer: >60 mL/min (ref >60)
GLUCOSE: 195 mg/dL — AB (ref 65–99)
POTASSIUM: 3.4 mmol/L — AB (ref 3.5–5.1)
SODIUM: 139 mmol/L (ref 135–145)
Total Bilirubin: 5.5 mg/dL — ABNORMAL HIGH (ref 0.3–1.2)
Total Protein: 5.9 g/dL — ABNORMAL LOW (ref 6.5–8.1)

## 2016-10-15 LAB — CBC
HEMATOCRIT: 28.5 % — AB (ref 39.0–52.0)
HEMOGLOBIN: 9.9 g/dL — AB (ref 13.0–17.0)
MCH: 31.3 pg (ref 26.0–34.0)
MCHC: 34.7 g/dL (ref 30.0–36.0)
MCV: 90.2 fL (ref 78.0–100.0)
Platelets: 114 10*3/uL — ABNORMAL LOW (ref 150–400)
RBC: 3.16 MIL/uL — AB (ref 4.22–5.81)
RDW: 14.2 % (ref 11.5–15.5)
WBC: 3.2 10*3/uL — ABNORMAL LOW (ref 4.0–10.5)

## 2016-10-15 LAB — GLUCOSE, CAPILLARY
GLUCOSE-CAPILLARY: 73 mg/dL (ref 65–99)
Glucose-Capillary: 184 mg/dL — ABNORMAL HIGH (ref 65–99)
Glucose-Capillary: 387 mg/dL — ABNORMAL HIGH (ref 65–99)
Glucose-Capillary: 225 mg/dL — ABNORMAL HIGH (ref 65–99)

## 2016-10-15 LAB — HAPTOGLOBIN: HAPTOGLOBIN: 206 mg/dL — AB (ref 34–200)

## 2016-10-15 NOTE — Progress Notes (Signed)
Subjective: No complaints.  Feeling well.  Objective: Vital signs in last 24 hours: Temp:  [97.7 F (36.5 C)-100.1 F (37.8 C)] 97.7 F (36.5 C) (12/25 0515) Pulse Rate:  [61-73] 61 (12/25 0515) Resp:  [15-21] 16 (12/25 0515) BP: (153-190)/(75-116) 153/75 (12/25 0515) SpO2:  [98 %-100 %] 100 % (12/25 0515) Last BM Date: 10/14/16  Intake/Output from previous day: 12/24 0701 - 12/25 0700 In: 1015 [P.O.:240; I.V.:675; IV Piggyback:100] Out: -  Intake/Output this shift: No intake/output data recorded.  General appearance: alert and no distress GI: soft, non-tender; bowel sounds normal; no masses,  no organomegaly  Lab Results:  Recent Labs  10/13/16 0440 10/14/16 0458 10/15/16 0433  WBC 6.9 4.1 3.2*  HGB 10.7* 10.0* 9.9*  HCT 31.1* 29.5* 28.5*  PLT 141* 110* 114*   BMET  Recent Labs  10/13/16 0440 10/14/16 0458 10/15/16 0433  NA 139 135 139  K 3.8 3.3* 3.4*  CL 108 103 107  CO2 _0 GLUCOSE 133* 226* 195*  BUN _1 CREATININE 0.59* 0.73 0.56*  CALCIUM 8.4* 8.4* 8.7*   LFT  Recent Labs  10/12/16 1612  10/15/16 0433  PROT 6.1*  < > 5.9*  ALBUMIN 3.0*  < > 2.8*  AST 135*  < > 154*  ALT 173*  < > 147*  ALKPHOS 871*  < > 753*  BILITOT 5.8*  < > 5.5*  BILIDIR 3.8*  --   --   < > = values in this interval not displayed. PT/INR  Recent Labs  10/12/16 2021  LABPROT 13.0  INR 0.98   Hepatitis Panel No results for input(s): HEPBSAG, HCVAB, HEPAIGM, HEPBIGM in the last 72 hours. C-Diff No results for input(s): CDIFFTOX in the last 72 hours. Fecal Lactopherrin No results for input(s): FECLLACTOFRN in the last 72 hours.  Studies/Results: Ct Abdomen Pelvis W Contrast  Result Date: 10/13/2016 CLINICAL DATA:  History of pancreatic carcinoma with fever and chills EXAM: CT ABDOMEN AND PELVIS WITH CONTRAST TECHNIQUE: Multidetector CT imaging of the abdomen and pelvis was performed using the standard protocol following bolus administration of  intravenous contrast. CONTRAST:  126m ISOVUE-300 IOPAMIDOL (ISOVUE-300) INJECTION 61%, 325mISOVUE-300 IOPAMIDOL (ISOVUE-300) INJECTION 61% COMPARISON:  07/30/2016, 10/11/2016 FINDINGS: Lower chest: No acute abnormality. Hepatobiliary: Scattered hypodensities are again identified throughout the liver consistent with metastatic disease. The overall appearance is stable. Some differential in the enhancement of the liver is noted related to the timing of the contrast bolus. No significant biliary ductal dilatation is seen. The gallbladder is well distended. Common bile duct stent is noted in satisfactory position. Cement soft tissue density is noted within which may represent debris. Pancreas: Fullness in the pancreatic head is again noted consistent with the patient's given clinical history of pancreatic carcinoma. Common bile duct stent courses through this area. Spleen: Normal in size without focal abnormality. Adrenals/Urinary Tract: Adrenal glands are unremarkable. Kidneys are normal, without renal calculi, focal lesion, or hydronephrosis. Bladder is unremarkable. Stomach/Bowel: Stent is noted within the duodenum proximally which appears patent. The appendix is within normal limits. Scattered diverticular change of the colon is noted without diverticulitis. No obstructive changes are seen. The CT Um and proximal portion of the ascending colon are predominately decompressed with some mild pericolonic inflammatory changes. This may represent some focal colitis. Vascular/Lymphatic: Aortic atherosclerosis. No enlarged abdominal or pelvic lymph nodes. Reproductive: Prostate is unremarkable. Other: Minimal ascites is noted along the liver. Musculoskeletal: No acute or significant osseous findings. IMPRESSION: Changes  consistent with the known history of pancreatic head mass with liver metastatic disease. Duodenal and common bile duct stents are noted in place. Changes suggestive of focal colitis within the right colon  Electronically Signed   By: Mark  Lukens M.D.   On: 10/13/2016 17:25    Medications:  Scheduled: . ceFEPime (MAXIPIME) IV  2 g Intravenous BID  . insulin aspart  0-9 Units Subcutaneous TID WC  . insulin detemir  30 Units Subcutaneous Daily  . lipase/protease/amylase  36,000 Units Oral TID AC  . loratadine  10 mg Oral Daily  . multivitamin with minerals  1 tablet Oral Daily  . pantoprazole  40 mg Oral Daily  . sodium chloride flush  3 mL Intravenous Q12H   Continuous:   Assessment/Plan: 1) Pancreatic cancer. 2) Obstructive jaundice.   The patient will be evaluated by IR tomorrow for PTC.     LOS: 3 days   HUNG,PATRICK D 10/15/2016, 12:03 PM 

## 2016-10-15 NOTE — Progress Notes (Signed)
PROGRESS NOTE  Jacob Hardin  [id number] DOB: [birth year] DOA: 10/12/2016 PCP: [name], MD  Outpatient Specialists: Dr [name], gastroenterology  Brief Narrative: Jacob Hardin is a 70 y.o. 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 [name], (had placement of a percutaneous internal/external biliary drain 11/25/2015; biliary drainage catheter capped 12/02/2015; biliary drain internalized 12/13/2015), HTN, HLD, DM, and GERD, who presented 12/22 with chills. He had no other significant symptoms, but had similar presentation in February of this year that developed into sepsis. On arrival, work up was significant for abdominal U/S showing sludge with probable nonshadowing gallstones in thickened edematous gallbladder as well as dilated biliary ducts with air. WBC 3.2, lactate 1.37, negative urinalysis, potassium 3.4, creatinine 1.04, abnormal liver function with total bilirubin 5.8, ALP 871, AST 175 and ALT 173. temperature 100.2, tachycardia, oxygen saturation 97% on room air. Oncology and GI were consulted and he was admitted for treatment of sepsis due to presumed acute cholecystitis. Subsequent CT showed distended GB without evidence of biliary obstruction. EGD 12/24 showed obstructing debris in biliary stent. IR consulted and Scheduled now for percutaneous transhepatic cholangiogram/drain placement in IR 12/26  Assessment & Plan: Principal Problem:   Sepsis ([place]) Active Problems:   Diabetes mellitus without complication ([place])   Cancer of head of pancreas ([place])   Metastatic cancer ([place])   Immunocompromised patient ([place])   Malnutrition of moderate degree   GERD (gastroesophageal reflux disease)   Hyperbilirubinemia   Hypokalemia  Sepsis possibly due to cholestasis: Patient has abnormal liver function with increased total bilirubin plus abdominal  ultrasound findings, consistent with biliary system infection. Sepsis resolved since admission. Lactate is normal. Hemodynamically stable currently. 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  - Continue IVF's - Continue cefepime - Blood cultures x 2 NGTD - Lactate wnl. - Monitor CBC w/diff: WBC trending back downward - DC tele  Biliary obstruction/hyperbilirubinemia: TBili 5.8 on admission, slowly rising as outpatient with evidence of GB inflammation on U/S. CT showed distended gallbladder without inflammation. No evidence of hemolysis. EGD 12/24 showed biliary stent occlusion.  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 [name].  Patient is on chemotherapy, last dose was 3 weeks ago, recently held for hyperbilirubinemia. CA-19-9 was 283 on 10/10/16 - f/u with Dr. [name] as outpatient  Hypokalemia: K= 3.4, replete and recheck. Due to poor po from frequent NPO status for procedures  DM-II: Last A1c 7.0 on 09/19/15, well controled. Patient is taking Levemir at home -will decrease Lantus dose from 40-->30 units daily  -SSI  Malnutrition of moderate degree: -Nutrition consult  GERD: -Protonix  DVT prophylaxis: SCDs Code Status: Partial code, DNI, otherwise all resuscitative measures.  Family Communication: Wife at bedside Disposition Plan: Continued treatment of sepsis, pending IR procedure Consultants:   Gastroenterology, Dr. [name]  Oncology   Interventional radiology  Procedures:   EGD 12/24  Antimicrobials:  Cefepime 12/22 >>    Subjective: Pt feels improved, no chills and no fever. Still jaundiced. Denies abd pain, N/V/D, ate breakfast without issues this AM. Ambulating frequently.  Objective: Vitals:   10/14/16 1610 10/14/16 1640 10/14/16 2136 10/15/16 0515  BP: (!) 153/84 (!) 154/85 (!)  172/86 (!) 153/75  Pulse:  68 73 61  Resp:  16 16 16  Temp:  97.7 F (36.5 C) 100.1 F (37.8 C) 97.7 F (36.5 C)  TempSrc:  Oral Oral Oral  SpO2:  98% 98% 100%  Weight:      Height:        Intake/Output Summary (Last 24 hours) at 10/15/16 1240 Last data filed at 10/14/16 2053  Gross per 24 hour  Intake              725 ml  Output                0 ml  Net              725 ml   Filed Weights   10/12/16 2300  Weight: 73.6 kg (162 lb 4.1 oz)    Examination: General exam: Jaundiced 70 y.o. male in no distress Respiratory system: Non-labored breathing room air. Clear to auscultation bilaterally.  Cardiovascular system: Regular rate and rhythm. No murmur, rub, or gallop. No JVD, and no pedal edema. Gastrointestinal system: Abdomen soft, absolutely not tender, non-distended, with normoactive bowel sounds. No organomegaly or masses felt. Central nervous system: Alert and oriented. No focal neurological deficits. Extremities: Warm, no deformities Skin: No rashes. Right upper chest port c/d/i without erythema Psychiatry: Judgement and insight appear normal. Mood & affect appropriate.   Data Reviewed: I have personally reviewed following labs and imaging studies  CBC:  Recent Labs Lab 10/10/16 0742 10/12/16 1612 10/13/16 0440 10/14/16 0458 10/15/16 0433  WBC 3.9* 3.2* 6.9 4.1 3.2*  NEUTROABS 2.4 2.8  --   --   --   HGB 10.9* 10.1* 10.7* 10.0* 9.9*  HCT 32.5* 29.9* 31.1* 29.5* 28.5*  MCV 93.1 92.6 92.3 90.5 90.2  PLT 143 128* 141* 110* [id number]*   Basic Metabolic Panel:  Recent Labs Lab 10/10/16 0742 10/12/16 1612 10/13/16 0440 10/14/16 0458 10/15/16 0433  NA 136 134* 139 135 139  K 3.7 3.4* 3.8 3.3* 3.4*  CL  --  101 108 103 107  CO2 27 24 25 26 26   GLUCOSE 219* 350* 133* 226* 195*  BUN 11.5 17 11 11 11   CREATININE 0.8 1.04 0.59* 0.73 0.56*  CALCIUM 9.0 8.3* 8.4* 8.4* 8.7*   GFR: Estimated Creatinine Clearance: 92 mL/min (by C-G formula based on SCr of 0.56  mg/dL (L)). Liver Function Tests:  Recent Labs Lab 10/10/16 0742 10/12/16 1612 10/13/16 0440 10/14/16 0458 10/15/16 0433  AST 173* 135* 117* 118* 154*  ALT 204* 173* 155* 138* 147*  ALKPHOS 1,064* 871* 798* 699* 753*  BILITOT 2.18* 5.8* 4.8* 5.1* 5.5*  PROT 6.6 6.1* 6.0* 6.0* 5.9*  ALBUMIN 3.1* 3.0* 3.0* 2.8* 2.8*    Recent Labs Lab 10/12/16 1612  LIPASE <10*  AMYLASE 42    Recent Labs Lab 10/12/16 1612  AMMONIA 43*   Coagulation Profile:  Recent Labs Lab 10/12/16 2021  INR 0.98   Cardiac Enzymes: No results for input(s): CKTOTAL, CKMB, CKMBINDEX, TROPONINI in the last 168 hours. BNP (last 3 results) No results for input(s): PROBNP in the last 8760 hours. HbA1C: No results for input(s): HGBA1C in the last 72 hours. CBG:  Recent Labs Lab 10/14/16 1138 10/14/16 1702 10/14/16 2135 10/15/16 0727 10/15/16 1141  GLUCAP 279* 228* 232* 184* 387*   Lipid Profile: No results for input(s): CHOL, HDL, LDLCALC, TRIG, CHOLHDL, LDLDIRECT in the last 72 hours. Thyroid Function Tests: No results for input(s): TSH, T4TOTAL, FREET4, T3FREE, THYROIDAB in the last 72 hours. Anemia Panel: No results for input(s): VITAMINB12, FOLATE, FERRITIN,  TIBC, IRON, RETICCTPCT in the last 72 hours. Urine analysis:    Component Value Date/Time   COLORURINE AMBER (A) 10/12/2016 1540   APPEARANCEUR CLEAR 10/12/2016 1540   LABSPEC 1.035 (H) 10/12/2016 1540   PHURINE 5.0 10/12/2016 1540   GLUCOSEU >=500 (A) 10/12/2016 1540   HGBUR SMALL (A) 10/12/2016 1540   BILIRUBINUR MODERATE (A) 10/12/2016 1540   KETONESUR NEGATIVE 10/12/2016 1540   PROTEINUR 30 (A) 10/12/2016 1540   NITRITE NEGATIVE 10/12/2016 1540   LEUKOCYTESUR NEGATIVE 10/12/2016 1540   Sepsis Labs: @LABRCNTIP (procalcitonin:4,lacticidven:4)  ) Recent Results (from the past 240 hour(s))  Culture, blood (x 2)     Status: None (Preliminary result)   Collection Time: 10/12/16  4:12 PM  Result Value Ref Range Status    Specimen Description PORTA CATH RIGHT  Final   Special Requests BOTTLES DRAWN AEROBIC AND ANAEROBIC 5CC  Final   Culture   Final    NO GROWTH 2 DAYS Performed at [facility]    Report Status PENDING  Incomplete  Respiratory Panel by PCR     Status: None   Collection Time: 10/12/16  7:00 PM  Result Value Ref Range Status   Adenovirus NOT DETECTED NOT DETECTED Final   Coronavirus 229E NOT DETECTED NOT DETECTED Final   Coronavirus HKU1 NOT DETECTED NOT DETECTED Final   Coronavirus NL63 NOT DETECTED NOT DETECTED Final   Coronavirus OC43 NOT DETECTED NOT DETECTED Final   Metapneumovirus NOT DETECTED NOT DETECTED Final   Rhinovirus / Enterovirus NOT DETECTED NOT DETECTED Final   Influenza A NOT DETECTED NOT DETECTED Final   Influenza B NOT DETECTED NOT DETECTED Final   Parainfluenza Virus 1 NOT DETECTED NOT DETECTED Final   Parainfluenza Virus 2 NOT DETECTED NOT DETECTED Final   Parainfluenza Virus 3 NOT DETECTED NOT DETECTED Final   Parainfluenza Virus 4 NOT DETECTED NOT DETECTED Final   Respiratory Syncytial Virus NOT DETECTED NOT DETECTED Final   Bordetella pertussis NOT DETECTED NOT DETECTED Final   Chlamydophila pneumoniae NOT DETECTED NOT DETECTED Final   Mycoplasma pneumoniae NOT DETECTED NOT DETECTED Final    Comment: Performed at [facility]  Culture, blood (x 2)     Status: None (Preliminary result)   Collection Time: 10/12/16  8:17 PM  Result Value Ref Range Status   Specimen Description RIGHT ANTECUBITAL  Final   Special Requests BOTTLES DRAWN AEROBIC AND ANAEROBIC 5CC  Final   Culture   Final    NO GROWTH 2 DAYS Performed at [facility]    Report Status PENDING  Incomplete     Radiology Studies: Ct Abdomen Pelvis W Contrast  Result Date: 10/13/2016 CLINICAL DATA:  History of pancreatic carcinoma with fever and chills EXAM: CT ABDOMEN AND PELVIS WITH CONTRAST TECHNIQUE: Multidetector CT imaging of the abdomen and pelvis was performed using the  standard protocol following bolus administration of intravenous contrast. CONTRAST:  116mL ISOVUE-300 IOPAMIDOL (ISOVUE-300) INJECTION 61%, 21mL ISOVUE-300 IOPAMIDOL (ISOVUE-300) INJECTION 61% COMPARISON:  07/30/2016, 10/11/2016 FINDINGS: Lower chest: No acute abnormality. Hepatobiliary: Scattered hypodensities are again identified throughout the liver consistent with metastatic disease. The overall appearance is stable. Some differential in the enhancement of the liver is noted related to the timing of the contrast bolus. No significant biliary ductal dilatation is seen. The gallbladder is well distended. Common bile duct stent is noted in satisfactory position. Cement soft tissue density is noted within which may represent debris. Pancreas: Fullness in the pancreatic head is again noted consistent with the  patient's given clinical history of pancreatic carcinoma. Common bile duct stent courses through this area. Spleen: Normal in size without focal abnormality. Adrenals/Urinary Tract: Adrenal glands are unremarkable. Kidneys are normal, without renal calculi, focal lesion, or hydronephrosis. Bladder is unremarkable. Stomach/Bowel: Stent is noted within the duodenum proximally which appears patent. The appendix is within normal limits. Scattered diverticular change of the colon is noted without diverticulitis. No obstructive changes are seen. The CT Um and proximal portion of the ascending colon are predominately decompressed with some mild pericolonic inflammatory changes. This may represent some focal colitis. Vascular/Lymphatic: Aortic atherosclerosis. No enlarged abdominal or pelvic lymph nodes. Reproductive: Prostate is unremarkable. Other: Minimal ascites is noted along the liver. Musculoskeletal: No acute or significant osseous findings. IMPRESSION: Changes consistent with the known history of pancreatic head mass with liver metastatic disease. Duodenal and common bile duct stents are noted in place. Changes  suggestive of focal colitis within the right colon Electronically Signed   By: Inez Catalina M.D.   On: 10/13/2016 17:25    Scheduled Meds: . ceFEPime (MAXIPIME) IV  2 g Intravenous BID  . insulin aspart  0-9 Units Subcutaneous TID WC  . insulin detemir  30 Units Subcutaneous Daily  . lipase/protease/amylase  36,000 Units Oral TID AC  . loratadine  10 mg Oral Daily  . multivitamin with minerals  1 tablet Oral Daily  . pantoprazole  40 mg Oral Daily  . sodium chloride flush  3 mL Intravenous Q12H   Continuous Infusions:    LOS: 3 days   Time spent: 25 minutes.  Vance Gather, MD [facility] Pager [phone]  If 7PM-7AM, please contact night-coverage [email or web address] Password [email or web address] 10/15/2016, 12:40 PM

## 2016-10-15 NOTE — Consult Note (Signed)
Chief Complaint: Patient was seen in consultation today for percutaneous transhepatic cholangiogram/drain placement Chief Complaint  Patient presents with  . Chills  . Abnormal Lab   at the request of Dr Mathews Robinsons  Referring Physician(s): Dr Benny Lennert  Supervising Physician: Markus Daft  Patient Status: [facility]  History of Present Illness: Jacob Hardin is a 70 y.o. 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                           - Normal stomach.                           - Acquired duodenal stenosis.                           - No specimens collected. Request now for percutaneous transhepatic cholangiogram with drain placement Per Dr [name] Dr Annamaria Boots has reviewed imaging and approves procedure Planned for 12/26  Past Medical History:  Diagnosis Date  . Hyperlipidemia   . Hypertension    recently weight lost-no meds now-running low.  Marland Kitchen PONV (postoperative nausea and vomiting)   . Primary pancreatic adenocarcinoma (HCC)    mets to liver and duodenum  . Skin cancer    "burned off my face & head"  . Type II diabetes mellitus ([place])     Past Surgical History:  Procedure Laterality Date  . ANTERIOR CERVICAL DECOMP/DISCECTOMY FUSION  1994   cervical-bone graft  . BACK SURGERY    . COLONOSCOPY    . DUODENAL STENT PLACEMENT N/A 11/22/2015   Procedure: DUODENAL STENT PLACEMENT;  Surgeon: Carol Ada, MD;  Location: [place];  Service: Endoscopy;  Laterality: N/A;  . ESOPHAGOGASTRODUODENOSCOPY N/A 11/18/2015   Procedure: ESOPHAGOGASTRODUODENOSCOPY (EGD);  Surgeon: Carol Ada, MD;  Location: [facility] ENDOSCOPY;  Service: Endoscopy;  Laterality: N/A;  . ESOPHAGOGASTRODUODENOSCOPY N/A 11/22/2015   Procedure:  ESOPHAGOGASTRODUODENOSCOPY (EGD);  Surgeon: Carol Ada, MD;  Location: [facility] ENDOSCOPY;  Service: Endoscopy;  Laterality: N/A;  Uncovered duodenal stent placement.  Fluoroscopy required.  . EUS  11/18/2015   Procedure: UPPER ENDOSCOPIC ULTRASOUND (EUS) LINEAR;  Surgeon: Carol Ada, MD;  Location: [place];  Service: Endoscopy;;  . INGUINAL HERNIA REPAIR Left   . SHOULDER ARTHROSCOPY W/ ROTATOR CUFF REPAIR Left 2014  . TONSILLECTOMY    . WISDOM TOOTH EXTRACTION      Allergies: Codeine  Medications: Prior to Admission medications   Medication Sig Start Date End Date Taking? Authorizing Provider  baclofen (LIORESAL) 10 MG tablet Take 5 mg by mouth 3 (three) times daily as needed (for hiccups).   Yes Historical Provider, MD  esomeprazole (NEXIUM) 40 MG capsule Take 40 mg by mouth daily.   Yes Historical Provider, MD  insulin aspart (NOVOLOG FLEXPEN) 100 UNIT/ML FlexPen Inject 4 Units into the skin daily before supper.   Yes Historical Provider, MD  Insulin Detemir (LEVEMIR FLEXTOUCH) 100 UNIT/ML Pen Inject 40 Units into the skin daily.   Yes Historical Provider, MD  lidocaine-prilocaine (EMLA) cream Apply 1 application topically as needed (prior to accessing port).  Yes Historical Provider, MD  lipase/protease/amylase (CREON) 36000 UNITS CPEP capsule Take 1 capsule (36,000 Units total) by mouth 3 (three) times daily before meals. 08/22/16  Yes Ladene Artist, MD  loperamide (IMODIUM) 2 MG capsule Take 2 mg by mouth as needed for diarrhea or loose stools.   Yes Historical Provider, MD  loratadine (CLARITIN) 10 MG tablet Take 10 mg by mouth daily.    Yes Historical Provider, MD  Multiple Vitamin (MULTIVITAMIN WITH MINERALS) TABS tablet Take 1 tablet by mouth daily.   Yes Historical Provider, MD  ondansetron (ZOFRAN) 8 MG tablet Take 8 mg by mouth every 6 (six) hours as needed for nausea or vomiting.    Yes Historical Provider, MD  polyethylene glycol (MIRALAX / GLYCOLAX) packet Take 17 g by  mouth daily as needed for mild constipation.    Yes Historical Provider, MD  prochlorperazine (COMPAZINE) 10 MG tablet Take 1 tablet (10 mg total) by mouth every 6 (six) hours as needed for nausea or vomiting. 12/02/15  Yes Ladene Artist, MD     Family History  Problem Relation Age of Onset  . Diabetes Mother   . Hypertension Mother   . Cancer Father     lung 20  . Hypertension Father   . Cancer Brother     pancreatic   . Diabetes Brother   . Cancer Paternal Grandfather     colon  . Cancer Paternal Uncle     prostate    Social History   Social History  . Marital status: Married    Spouse name: Lupita Leash  . Number of children: 0  . Years of education: N/A   Occupational History  . retired     Runner, broadcasting/film/video   Social History Main Topics  . Smoking status: Never Smoker  . Smokeless tobacco: Never Used  . Alcohol use No  . Drug use: No  . Sexual activity: Yes    Birth control/ protection: None     Comment: married to [place].  Retired Location manager   Other Topics Concern  . None   Social History Narrative   Married, wife Lupita Leash   No children   Retired Banker    Review of Systems: A 12 point ROS discussed and pertinent positives are indicated in the HPI above.  All other systems are negative.  Review of Systems  Constitutional: Positive for activity change and appetite change. Negative for fatigue and fever.  Respiratory: Negative for shortness of breath.   Cardiovascular: Negative for chest pain.  Gastrointestinal: Positive for nausea. Negative for abdominal pain.  Musculoskeletal: Negative for back pain.  Psychiatric/Behavioral: Negative for behavioral problems and confusion.    Vital Signs: BP (!) 153/75 (BP Location: Right Arm)   Pulse 61   Temp 97.7 F (36.5 C) (Oral)   Resp 16   Ht 6' (1.829 m)   Wt 162 lb 4.1 oz (73.6 kg)   SpO2 100%   BMI 22.01 kg/m   Physical Exam  Constitutional: He is oriented to person, place, and  time.  Cardiovascular: Normal rate, regular rhythm and normal heart sounds.   Pulmonary/Chest: Effort normal and breath sounds normal. He has no wheezes.  Abdominal: Soft. Bowel sounds are normal. There is no tenderness.  Musculoskeletal: Normal range of motion.  Neurological: He is alert and oriented to person, place, and time.  Skin: Skin is warm and dry.  Psychiatric: He has a normal mood and affect. His behavior is normal. Judgment  and thought content normal.  Nursing note and vitals reviewed.   Mallampati Score:  MD Evaluation Airway: WNL Heart: WNL Abdomen: WNL Chest/ Lungs: WNL ASA  Classification: 3 Mallampati/Airway Score: One  Imaging: Dg Chest 2 View  Result Date: 10/12/2016 CLINICAL DATA:  Fever, chills, pancreatic cancer EXAM: CHEST  2 VIEW COMPARISON:  01/09/2016 FINDINGS: Lungs are clear.  No pleural effusion or pneumothorax. The heart is normal in size. Right chest port terminates in the lower SVC. IMPRESSION: No evidence of acute cardiopulmonary disease. Electronically Signed   By: Julian Hy M.D.   On: 10/12/2016 19:53   US Abdomen Complete  Result Date: 10/11/2016 CLINICAL DATA:  History of pancreatic head carcinoma with biliary stent present EXAM: ABDOMEN ULTRASOUND COMPLETE COMPARISON:  CT abdomen and pelvis July 30, 2016 FINDINGS: Gallbladder: Within the gallbladder, there are multiple echogenic foci which move but do not shadow. Gallbladder wall is mildly thickened and subtly edematous. There is no pericholecystic fluid. Largest individual echogenic focus measures 9 mm in length. The patient is not focally tender over the gallbladder during this examination. Common bile duct: Diameter: 11 mm, dilated. Areas seen in the intrahepatic ducts as well as in the common bile duct. Stent not well seen, likely due to surrounding air. Liver: The liver has an inhomogeneous appearance with several nodular appearing areas throughout the liver ranging in size from 1-2  cm. Assessment of the liver is somewhat less than optimal due to air within the biliary ducts stool system. IVC: No abnormality visualized. Pancreas: There is a mass in the head of the pancreas which is somewhat hypoechoic measuring 1.9 x 1.6 x 1.2 cm. Note that portions of pancreas are obscured by gas. Spleen: Size and appearance within normal limits. Right Kidney: Length: 12.0 cm. Echogenicity within normal limits. No mass or hydronephrosis visualized. Left Kidney: Length: 10.0 cm. Echogenicity within normal limits. No mass or hydronephrosis visualized. Abdominal aorta: No aneurysm visualized. Note that gas obscures portions of the mid the distal aorta. Other findings: No evidence of ascites. IMPRESSION: Gallbladder contains sludge with probable nonshadowing gallstones. Gallbladder wall is thickened and subtly edematous. Suspect a degree of acute cholecystitis. Biliary ducts are dilated and contain air. Liver shows abnormal echogenicity with nodular opacities throughout the liver, concerning for metastatic disease superimposed on hepatic steatosis. Small mass in head of pancreas. Note that portions of pancreas not visualized. Portions of aorta not visualized. Visualized portions of aorta appear normal. Given this constellation of findings, it may be prudent to consider correlation with CT or MR of the abdomen, ideally with oral and intravenous contrast, to further evaluate. In particular, contrast-enhanced CT or MR could be helpful to assess for degree of potential hepatic metastatic disease. These results will be called to the ordering clinician or representative by the Radiologist Assistant, and communication documented in the PACS or zVision Dashboard. Electronically Signed   By: Lowella Grip III M.D.   On: 10/11/2016 08:53   Ct Abdomen Pelvis W Contrast  Result Date: 10/13/2016 CLINICAL DATA:  History of pancreatic carcinoma with fever and chills EXAM: CT ABDOMEN AND PELVIS WITH CONTRAST TECHNIQUE:  Multidetector CT imaging of the abdomen and pelvis was performed using the standard protocol following bolus administration of intravenous contrast. CONTRAST:  114m ISOVUE-300 IOPAMIDOL (ISOVUE-300) INJECTION 61%, 374mISOVUE-300 IOPAMIDOL (ISOVUE-300) INJECTION 61% COMPARISON:  07/30/2016, 10/11/2016 FINDINGS: Lower chest: No acute abnormality. Hepatobiliary: Scattered hypodensities are again identified throughout the liver consistent with metastatic disease. The overall appearance is stable. Some differential in  the enhancement of the liver is noted related to the timing of the contrast bolus. No significant biliary ductal dilatation is seen. The gallbladder is well distended. Common bile duct stent is noted in satisfactory position. Cement soft tissue density is noted within which may represent debris. Pancreas: Fullness in the pancreatic head is again noted consistent with the patient's given clinical history of pancreatic carcinoma. Common bile duct stent courses through this area. Spleen: Normal in size without focal abnormality. Adrenals/Urinary Tract: Adrenal glands are unremarkable. Kidneys are normal, without renal calculi, focal lesion, or hydronephrosis. Bladder is unremarkable. Stomach/Bowel: Stent is noted within the duodenum proximally which appears patent. The appendix is within normal limits. Scattered diverticular change of the colon is noted without diverticulitis. No obstructive changes are seen. The CT Um and proximal portion of the ascending colon are predominately decompressed with some mild pericolonic inflammatory changes. This may represent some focal colitis. Vascular/Lymphatic: Aortic atherosclerosis. No enlarged abdominal or pelvic lymph nodes. Reproductive: Prostate is unremarkable. Other: Minimal ascites is noted along the liver. Musculoskeletal: No acute or significant osseous findings. IMPRESSION: Changes consistent with the known history of pancreatic head mass with liver  metastatic disease. Duodenal and common bile duct stents are noted in place. Changes suggestive of focal colitis within the right colon Electronically Signed   By: Inez Catalina M.D.   On: 10/13/2016 17:25    Labs:  CBC:  Recent Labs  10/12/16 1612 10/13/16 0440 10/14/16 0458 10/15/16 0433  WBC 3.2* 6.9 4.1 3.2*  HGB 10.1* 10.7* 10.0* 9.9*  HCT 29.9* 31.1* 29.5* 28.5*  PLT 128* 141* 110* 114*    COAGS:  Recent Labs  11/25/15 0753 11/30/15 1130 12/13/15 0750 10/12/16 2021  INR 1.07 1.10 1.08 0.98  APTT '27 28 26 30    '$ BMP:  Recent Labs  10/12/16 1612 10/13/16 0440 10/14/16 0458 10/15/16 0433  NA 134* 139 135 139  K 3.4* 3.8 3.3* 3.4*  CL 101 108 103 107  CO2 '24 25 26 26  '$ GLUCOSE 350* 133* 226* 195*  BUN '17 11 11 11  '$ CALCIUM 8.3* 8.4* 8.4* 8.7*  CREATININE 1.04 0.59* 0.73 0.56*  GFRNONAA >60 >60 >60 >60  GFRAA >60 >60 >60 >60    LIVER FUNCTION TESTS:  Recent Labs  10/12/16 1612 10/13/16 0440 10/14/16 0458 10/15/16 0433  BILITOT 5.8* 4.8* 5.1* 5.5*  AST 135* 117* 118* 154*  ALT 173* 155* 138* 147*  ALKPHOS 871* 798* 699* 753*  PROT 6.1* 6.0* 6.0* 5.9*  ALBUMIN 3.0* 3.0* 2.8* 2.8*    TUMOR MARKERS:  Recent Labs  11/10/15 1518 01/19/16 0459  AFPTM 1.8  --   CA199  --  481*    Assessment and Plan:  Pancreatic Ca Existing duodenal and biliary stents Rising bilirubin Obstructive jaundice Scheduled now for percutaneous transhepatic cholangiogram/drain placement in IR 12/26 Risks and Benefits discussed with the patient including, but not limited to bleeding, infection which may lead to sepsis or even death and damage to adjacent structures. All of the patient's questions were answered, patient is agreeable to proceed. Consent signed and in chart.  Thank you for this interesting consult.  I greatly enjoyed meeting Jacob Hardin and look forward to participating in their care.  A copy of this report was sent to the requesting provider on  this date.  Electronically Signed: Monia Sabal A 10/15/2016, 10:39 AM   I spent a total of 40 Minutes    in face to face in clinical consultation, greater than  50% of which was counseling/coordinating care for PTC/drain

## 2016-10-15 NOTE — Progress Notes (Signed)
Pharmacy Antibiotic Note  Jacob Hardin is a 70 y.o. 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 Also considering UTI, but UA negative and no urinary Sx. Pharmacy has been consulted for Cefepime dosing. Last chemo 12/1, and noted ANC 2.8  Plan:  Cefepime 2 g IV q12 hr  Height: 6' (182.9 cm) Weight: 162 lb 4.1 oz (73.6 kg) IBW/kg (Calculated) : 77.6  Temp (24hrs), Avg:98.7 F (37.1 C), Min:97.7 F (36.5 C), Max:100.1 F (37.8 C)   Recent Labs Lab 10/10/16 0742 10/12/16 1612 10/12/16 1621 10/12/16 2021 10/12/16 2302 10/13/16 0440 10/14/16 0458 10/15/16 0433  WBC 3.9* 3.2*  --   --   --  6.9 4.1 3.2*  CREATININE 0.8 1.04  --   --   --  0.59* 0.73 0.56*  LATICACIDVEN  --   --  1.37 1.5 1.9  --   --   --     Estimated Creatinine Clearance: 92 mL/min (by C-G formula based on SCr of 0.56 mg/dL (L)).    Allergies  Allergen Reactions  . Codeine Nausea Only    Antimicrobials this admission: Cefepime 12/22 >>   Dose adjustments this admission: Microbiology results: 12/22 BCx: ngtd 12/22 resp panel: neg  Thank you for allowing pharmacy to be a part of this patient's care.  Minda Ditto PharmD Pager [phone] 10/15/2016, 10:02 AM

## 2016-10-16 ENCOUNTER — Inpatient Hospital Stay (HOSPITAL_COMMUNITY): Payer: Medicare Other

## 2016-10-16 ENCOUNTER — Ambulatory Visit (HOSPITAL_COMMUNITY): Admission: RE | Admit: 2016-10-16 | Payer: Medicare Other | Source: Ambulatory Visit

## 2016-10-16 ENCOUNTER — Encounter (HOSPITAL_COMMUNITY): Payer: Self-pay | Admitting: Gastroenterology

## 2016-10-16 HISTORY — PX: IR GENERIC HISTORICAL: IMG1180011

## 2016-10-16 LAB — CBC WITH DIFFERENTIAL/PLATELET
BASOS ABS: 0 10*3/uL (ref 0.0–0.1)
BASOS PCT: 1 %
Eosinophils Absolute: 0.2 10*3/uL (ref 0.0–0.7)
Eosinophils Relative: 4 %
HEMATOCRIT: 28.3 % — AB (ref 39.0–52.0)
HEMOGLOBIN: 10 g/dL — AB (ref 13.0–17.0)
Lymphocytes Relative: 22 %
Lymphs Abs: 0.9 10*3/uL (ref 0.7–4.0)
MCH: 31.4 pg (ref 26.0–34.0)
MCHC: 35.3 g/dL (ref 30.0–36.0)
MCV: 89 fL (ref 78.0–100.0)
Monocytes Absolute: 0.5 10*3/uL (ref 0.1–1.0)
Monocytes Relative: 11 %
NEUTROS ABS: 2.6 10*3/uL (ref 1.7–7.7)
NEUTROS PCT: 62 %
Platelets: 126 10*3/uL — ABNORMAL LOW (ref 150–400)
RBC: 3.18 MIL/uL — ABNORMAL LOW (ref 4.22–5.81)
RDW: 14.2 % (ref 11.5–15.5)
WBC: 4.2 10*3/uL (ref 4.0–10.5)

## 2016-10-16 LAB — COMPREHENSIVE METABOLIC PANEL
ALBUMIN: 2.8 g/dL — AB (ref 3.5–5.0)
ALK PHOS: 871 U/L — AB (ref 38–126)
ALT: 166 U/L — AB (ref 17–63)
AST: 189 U/L — AB (ref 15–41)
Anion gap: 7 (ref 5–15)
BILIRUBIN TOTAL: 5 mg/dL — AB (ref 0.3–1.2)
BUN: 12 mg/dL (ref 6–20)
CALCIUM: 8.5 mg/dL — AB (ref 8.9–10.3)
CO2: 26 mmol/L (ref 22–32)
Chloride: 104 mmol/L (ref 101–111)
Creatinine, Ser: 0.64 mg/dL (ref 0.61–1.24)
GFR calc Af Amer: >60 mL/min (ref >60)
GFR calc non Af Amer: >60 mL/min (ref >60)
GLUCOSE: 98 mg/dL (ref 65–99)
Potassium: 3.4 mmol/L — ABNORMAL LOW (ref 3.5–5.1)
Sodium: 137 mmol/L (ref 135–145)
TOTAL PROTEIN: 6.2 g/dL — AB (ref 6.5–8.1)

## 2016-10-16 LAB — GLUCOSE, CAPILLARY
GLUCOSE-CAPILLARY: 252 mg/dL — AB (ref 65–99)
Glucose-Capillary: 136 mg/dL — ABNORMAL HIGH (ref 65–99)
Glucose-Capillary: 161 mg/dL — ABNORMAL HIGH (ref 65–99)

## 2016-10-16 MED ORDER — FENTANYL CITRATE (PF) 100 MCG/2ML IJ SOLN
INTRAMUSCULAR | Status: AC | PRN
  Administered 2016-10-16: 50 ug via INTRAVENOUS
  Administered 2016-10-16: 25 ug via INTRAVENOUS
  Administered 2016-10-16: 50 ug via INTRAVENOUS
  Administered 2016-10-16: 25 ug via INTRAVENOUS
  Administered 2016-10-16 (×2): 50 ug via INTRAVENOUS

## 2016-10-16 MED ORDER — ONDANSETRON HCL 4 MG/2ML IJ SOLN
INTRAMUSCULAR | Status: AC | PRN
  Administered 2016-10-16: 4 mg via INTRAVENOUS

## 2016-10-16 MED ORDER — MIDAZOLAM HCL 2 MG/2ML IJ SOLN
INTRAMUSCULAR | Status: AC | PRN
  Administered 2016-10-16: 1 mg via INTRAVENOUS
  Administered 2016-10-16 (×4): 0.5 mg via INTRAVENOUS
  Administered 2016-10-16: 1 mg via INTRAVENOUS

## 2016-10-16 MED ORDER — POTASSIUM CHLORIDE CRYS ER 20 MEQ PO TBCR
30.0000 meq | EXTENDED_RELEASE_TABLET | Freq: Once | ORAL | Status: DC
  Filled 2016-10-16: qty 1

## 2016-10-16 MED ORDER — LIDOCAINE HCL 1 % IJ SOLN
INTRAMUSCULAR | Status: AC
  Filled 2016-10-16: qty 20

## 2016-10-16 MED ORDER — LIDOCAINE HCL 1 % IJ SOLN
INTRAMUSCULAR | Status: AC | PRN
  Administered 2016-10-16: 5 mL via INTRADERMAL

## 2016-10-16 MED ORDER — ONDANSETRON HCL 4 MG/2ML IJ SOLN
INTRAMUSCULAR | Status: AC
  Filled 2016-10-16: qty 2

## 2016-10-16 MED ORDER — FENTANYL CITRATE (PF) 100 MCG/2ML IJ SOLN
INTRAMUSCULAR | Status: AC
  Filled 2016-10-16: qty 2

## 2016-10-16 MED ORDER — IOPAMIDOL (ISOVUE-300) INJECTION 61%
INTRAVENOUS | Status: AC
  Administered 2016-10-16: 15 mL
  Filled 2016-10-16: qty 50

## 2016-10-16 MED ORDER — MIDAZOLAM HCL 2 MG/2ML IJ SOLN
INTRAMUSCULAR | Status: AC
  Filled 2016-10-16: qty 4

## 2016-10-16 MED ORDER — FENTANYL CITRATE (PF) 100 MCG/2ML IJ SOLN
INTRAMUSCULAR | Status: AC
  Filled 2016-10-16: qty 4

## 2016-10-16 MED ORDER — HYDROXYZINE HCL 10 MG PO TABS
10.0000 mg | ORAL_TABLET | Freq: Three times a day (TID) | ORAL | Status: DC | PRN
  Administered 2016-10-17 – 2016-10-18 (×3): 10 mg via ORAL
  Filled 2016-10-16 (×5): qty 1

## 2016-10-16 NOTE — Progress Notes (Signed)
PROGRESS NOTE  Jacob Hardin  [id number] DOB: [birth year] DOA: 10/12/2016 PCP: [name], MD  Outpatient Specialists: Dr [name], gastroenterology  Brief Narrative: Jacob Hardin is a 70 y.o. 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 [name], (had placement of a percutaneous internal/external biliary drain 11/25/2015; biliary drainage catheter capped 12/02/2015; biliary drain internalized 12/13/2015), HTN, HLD, DM, and GERD, who presented 12/22 with chills. He had no other significant symptoms, but had similar presentation in February of this year that developed into sepsis. On arrival, work up was significant for abdominal U/S showing sludge with probable nonshadowing gallstones in thickened edematous gallbladder as well as dilated biliary ducts with air. WBC 3.2, lactate 1.37, negative urinalysis, potassium 3.4, creatinine 1.04, abnormal liver function with total bilirubin 5.8, ALP 871, AST 175 and ALT 173. temperature 100.2, tachycardia, oxygen saturation 97% on room air. Oncology and GI were consulted and he was admitted for treatment of sepsis due to presumed acute cholecystitis. Subsequent CT showed distended GB without evidence of biliary obstruction. EGD 12/24 showed obstructing debris in biliary stent. IR consulted and planning percutaneous transhepatic cholangiogram/drain placement in IR 12/26.  Assessment & Plan: Principal Problem:   Sepsis ([place]) Active Problems:   Diabetes mellitus without complication ([place])   Cancer of head of pancreas ([place])   Metastatic cancer ([place])   Immunocompromised patient ([place])   Malnutrition of moderate degree   GERD (gastroesophageal reflux disease)   Hyperbilirubinemia   Hypokalemia  Sepsis possibly due to cholestasis: Patient has abnormal liver function with increased total bilirubin plus abdominal ultrasound  findings, consistent with biliary system infection. Sepsis resolved since admission. Lactate is normal. Hemodynamically stable currently. 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  - Continue IVF's - Continue cefepime - Blood cultures x 2 NGTD - Lactate wnl. - Monitor CBC w/diff: WBC trending back downward  Biliary obstruction/hyperbilirubinemia: TBili 5.8 on admission, slowly rising as outpatient with evidence of GB inflammation on U/S. CT showed distended gallbladder without inflammation. No evidence of hemolysis. EGD 12/24 showed biliary stent occlusion.  - Discussed with IR: Scheduled now for percutaneous transhepatic cholangiogram/drain placement in IR 12/26 - Hydroxyzine prn itching  Cancer of head of pancreas, metastasized to liver and duodenum: Patient has been followed up by Dr. [name].  Patient is on chemotherapy, last dose was 3 weeks ago, recently held for hyperbilirubinemia. CA-19-9 was 283 on 10/10/16 - f/u with Dr. [name] as outpatient  Hypokalemia: K= 3.4, replete and recheck. Due to poor po from frequent NPO status for procedures  DM-II: Last A1c 7.0 on 09/19/15, well controled. Patient is taking Levemir at home -will decrease Lantus dose from 40-->30 units daily  -SSI  Malnutrition of moderate degree: -Nutrition consult  GERD: -Protonix  DVT prophylaxis: SCDs Code Status: Partial code, DNI, otherwise all resuscitative measures.  Family Communication: Wife at bedside Disposition Plan: Continued treatment of sepsis, pending IR procedure Consultants:   Gastroenterology, Dr. [name]  Oncology   Interventional radiology  Procedures:   EGD 12/24  Antimicrobials:  Cefepime 12/22 >>    Subjective: Pt feels improved, no chills and no fever. Still jaundiced with increasing pruritus. Denies abd pain, N/V/D. Ambulating frequently.  Objective: Vitals:   10/16/16 1453 10/16/16 1523 10/16/16 1556 10/16/16 1643  BP: (!) 190/91 (!) 182/94 (!) 186/84  (!) 165/87  Pulse: 63 60 60 74  Resp:  16 16 18   Temp: 97.8 F (  36.6 C) 97.5 F (36.4 C) 97.5 F (36.4 C) 97.5 F (36.4 C)  TempSrc:  Oral Oral Oral  SpO2: 97% 99% 99% 99%  Weight:      Height:        Intake/Output Summary (Last 24 hours) at 10/16/16 1836 Last data filed at 10/16/16 [id number]  Gross per 24 hour  Intake              110 ml  Output                0 ml  Net              110 ml   Filed Weights   10/12/16 2300  Weight: 73.6 kg (162 lb 4.1 oz)    Examination: General exam: Jaundiced 70 y.o. male in no distress Respiratory system: Non-labored breathing room air. Clear to auscultation bilaterally.  Cardiovascular system: Regular rate and rhythm. No murmur, rub, or gallop. No JVD, and no pedal edema. Gastrointestinal system: Abdomen soft, absolutely not tender, non-distended, with normoactive bowel sounds. No organomegaly or masses felt. Central nervous system: Alert and oriented. No focal neurological deficits. Extremities: Warm, no deformities Skin: No rashes. Right upper chest port c/d/i without erythema Psychiatry: Judgement and insight appear normal. Mood & affect appropriate.   Data Reviewed: I have personally reviewed following labs and imaging studies  CBC:  Recent Labs Lab 10/10/16 0742 10/12/16 1612 10/13/16 0440 10/14/16 0458 10/15/16 0433 10/16/16 0409  WBC 3.9* 3.2* 6.9 4.1 3.2* 4.2  NEUTROABS 2.4 2.8  --   --   --  2.6  HGB 10.9* 10.1* 10.7* 10.0* 9.9* 10.0*  HCT 32.5* 29.9* 31.1* 29.5* 28.5* 28.3*  MCV 93.1 92.6 92.3 90.5 90.2 89.0  PLT 143 128* 141* 110* 114* [id number]*   Basic Metabolic Panel:  Recent Labs Lab 10/12/16 1612 10/13/16 0440 10/14/16 0458 10/15/16 0433 10/16/16 0409  NA 134* 139 135 139 137  K 3.4* 3.8 3.3* 3.4* 3.4*  CL 101 108 103 107 104  CO2 24 25 26 26 26   GLUCOSE 350* 133* 226* 195* 98  BUN 17 11 11 11 12   CREATININE 1.04 0.59* 0.73 0.56* 0.64  CALCIUM 8.3* 8.4* 8.4* 8.7* 8.5*   GFR: Estimated Creatinine  Clearance: 92 mL/min (by C-G formula based on SCr of 0.64 mg/dL). Liver Function Tests:  Recent Labs Lab 10/12/16 1612 10/13/16 0440 10/14/16 0458 10/15/16 0433 10/16/16 0409  AST 135* 117* 118* 154* 189*  ALT 173* 155* 138* 147* 166*  ALKPHOS 871* 798* 699* 753* 871*  BILITOT 5.8* 4.8* 5.1* 5.5* 5.0*  PROT 6.1* 6.0* 6.0* 5.9* 6.2*  ALBUMIN 3.0* 3.0* 2.8* 2.8* 2.8*    Recent Labs Lab 10/12/16 1612  LIPASE <10*  AMYLASE 42    Recent Labs Lab 10/12/16 1612  AMMONIA 43*   Coagulation Profile:  Recent Labs Lab 10/12/16 2021  INR 0.98   Cardiac Enzymes: No results for input(s): CKTOTAL, CKMB, CKMBINDEX, TROPONINI in the last 168 hours. BNP (last 3 results) No results for input(s): PROBNP in the last 8760 hours. HbA1C: No results for input(s): HGBA1C in the last 72 hours. CBG:  Recent Labs Lab 10/15/16 1141 10/15/16 1644 10/15/16 2133 10/16/16 0742 10/16/16 1205  GLUCAP 387* 225* 73 136* 161*   Lipid Profile: No results for input(s): CHOL, HDL, LDLCALC, TRIG, CHOLHDL, LDLDIRECT in the last 72 hours. Thyroid Function Tests: No results for input(s): TSH, T4TOTAL, FREET4, T3FREE, THYROIDAB in the last 72 hours. Anemia Panel: No results  for input(s): VITAMINB12, FOLATE, FERRITIN, TIBC, IRON, RETICCTPCT in the last 72 hours. Urine analysis:    Component Value Date/Time   COLORURINE AMBER (A) 10/12/2016 1540   APPEARANCEUR CLEAR 10/12/2016 1540   LABSPEC 1.035 (H) 10/12/2016 1540   PHURINE 5.0 10/12/2016 1540   GLUCOSEU >=500 (A) 10/12/2016 1540   HGBUR SMALL (A) 10/12/2016 1540   BILIRUBINUR MODERATE (A) 10/12/2016 1540   KETONESUR NEGATIVE 10/12/2016 1540   PROTEINUR 30 (A) 10/12/2016 1540   NITRITE NEGATIVE 10/12/2016 1540   LEUKOCYTESUR NEGATIVE 10/12/2016 1540   Sepsis Labs: @LABRCNTIP (procalcitonin:4,lacticidven:4)  ) Recent Results (from the past 240 hour(s))  Culture, blood (x 2)     Status: None (Preliminary result)   Collection Time:  10/12/16  4:12 PM  Result Value Ref Range Status   Specimen Description PORTA CATH RIGHT  Final   Special Requests BOTTLES DRAWN AEROBIC AND ANAEROBIC 5CC  Final   Culture   Final    NO GROWTH 4 DAYS Performed at [facility]    Report Status PENDING  Incomplete  Respiratory Panel by PCR     Status: None   Collection Time: 10/12/16  7:00 PM  Result Value Ref Range Status   Adenovirus NOT DETECTED NOT DETECTED Final   Coronavirus 229E NOT DETECTED NOT DETECTED Final   Coronavirus HKU1 NOT DETECTED NOT DETECTED Final   Coronavirus NL63 NOT DETECTED NOT DETECTED Final   Coronavirus OC43 NOT DETECTED NOT DETECTED Final   Metapneumovirus NOT DETECTED NOT DETECTED Final   Rhinovirus / Enterovirus NOT DETECTED NOT DETECTED Final   Influenza A NOT DETECTED NOT DETECTED Final   Influenza B NOT DETECTED NOT DETECTED Final   Parainfluenza Virus 1 NOT DETECTED NOT DETECTED Final   Parainfluenza Virus 2 NOT DETECTED NOT DETECTED Final   Parainfluenza Virus 3 NOT DETECTED NOT DETECTED Final   Parainfluenza Virus 4 NOT DETECTED NOT DETECTED Final   Respiratory Syncytial Virus NOT DETECTED NOT DETECTED Final   Bordetella pertussis NOT DETECTED NOT DETECTED Final   Chlamydophila pneumoniae NOT DETECTED NOT DETECTED Final   Mycoplasma pneumoniae NOT DETECTED NOT DETECTED Final    Comment: Performed at [facility]  Culture, blood (x 2)     Status: None (Preliminary result)   Collection Time: 10/12/16  8:17 PM  Result Value Ref Range Status   Specimen Description RIGHT ANTECUBITAL  Final   Special Requests BOTTLES DRAWN AEROBIC AND ANAEROBIC 5CC  Final   Culture   Final    NO GROWTH 4 DAYS Performed at [facility]    Report Status PENDING  Incomplete     Radiology Studies: Ir Biliary Drain Placement With Cholangiogram  Result Date: 10/16/2016 INDICATION: Biliary obstruction.  Pancreatic cancer. EXAM: IR BILIARY DRAIN PLACEMENT W/ CHOLANGIOGRAM MEDICATIONS: Patient is  on antibiotics; The antibiotic was administered within an appropriate time frame prior to the initiation of the procedure. ANESTHESIA/SEDATION: Moderate (conscious) sedation was employed during this procedure. A total of Versed 4 mg and Fentanyl 250 mcg was administered intravenously. Moderate Sedation Time: 49 minutes. The patient's level of consciousness and vital signs were monitored continuously by radiology nursing throughout the procedure under my direct supervision. FLUOROSCOPY TIME:  Fluoroscopy Time: 18 minutes 48 seconds (556 mGy). COMPLICATIONS: None immediate. PROCEDURE: Informed written consent was obtained from the patient after a thorough discussion of the procedural risks, benefits and alternatives. All questions were addressed. Maximal Sterile Barrier Technique was utilized including caps, mask, sterile gowns, sterile gloves, sterile drape, hand hygiene  and skin antiseptic. A timeout was performed prior to the initiation of the procedure. The right flank was prepped and draped in a sterile fashion. 1% lidocaine was utilized for local anesthesia. Under sonographic guidance, a Chiba needle was advanced into the common hepatic duct. Contrast was injected opacifying the biliary system. A 21 gauge needle was then utilized to puncture a peripheral right hepatic duct under fluoroscopic guidance. It was removed over a 018 wire which was up sized to a 3 J. A 5 French Kumpe catheter was advanced over the 3 J. The catheter was then advanced over a glidewire into the duodenum across the common hepatic duct obstruction. The Kumpe be catheter was then removed over an Amplatz wire. A 10 [place] biliary drain was then advanced over the Amplatz wire. The tip was coiled in the duodenum. It was string fixed. Contrast was injected. FINDINGS: The initial cholangiogram confirms common hepatic duct obstruction above the existing stent. Subsequent images demonstrate access into the biliary system via a peripheral right  hepatic duct. The final image demonstrates a right internal external biliary drain with its tip coiled in the duodenum. IMPRESSION: Initial cholangiography confirms biliary obstruction in the common hepatic duct above the extent of the stent. A right internal external biliary drain was successful with its tip coiled in the duodenum. The patient will return for follow-up cholangiogram and possible stent placement. Electronically Signed   By: Marybelle Killings M.D.   On: 10/16/2016 16:52    Scheduled Meds: . ceFEPime (MAXIPIME) IV  2 g Intravenous BID  . insulin aspart  0-9 Units Subcutaneous TID WC  . insulin detemir  30 Units Subcutaneous Daily  . lipase/protease/amylase  36,000 Units Oral TID AC  . loratadine  10 mg Oral Daily  . multivitamin with minerals  1 tablet Oral Daily  . pantoprazole  40 mg Oral Daily  . potassium chloride  30 mEq Oral Once  . sodium chloride flush  3 mL Intravenous Q12H   Continuous Infusions:    LOS: 4 days   Time spent: 15 minutes.  Vance Gather, MD [facility] Pager [phone]  If 7PM-7AM, please contact night-coverage [email or web address] Password [facility] 10/16/2016, 6:36 PM

## 2016-10-16 NOTE — Sedation Documentation (Signed)
Patient is resting comfortably. 

## 2016-10-16 NOTE — Discharge Instructions (Signed)
Percutaneous Abscess Drain, Care After Refer to this sheet in the next few weeks. These instructions provide you with information on caring for yourself after your procedure. Your health care provider may also give you more specific instructions. Your treatment has been planned according to current medical practices, but problems sometimes occur. Call your health care provider if you have any problems or questions after your procedure. WHAT TO EXPECT AFTER THE PROCEDURE After your procedure, it is typical to have the following:   A small amount of discomfort in the area where the drainage tube was placed.  A small amount of bruising around the area where the drainage tube was placed.  Sleepiness and fatigue for the rest of the day from the medicines used. HOME CARE INSTRUCTIONS  Rest at home for 1-2 days following your procedure or as directed by your health care provider.  If you go home right after the procedure, plan to have someone with you for 24 hours.  Do not take a bathor shower for 24 hours after your procedure.  Take medicines only as directed by your health care provider. Ask your health care provider when you can resume taking any normal medicines.  Change bandages (dressings) as directed.   You may be told to record the amount of drainage from the bag every time you empty it. Follow your health care provider's directions for emptying the bag. Write down the amount of drainage, the date, and the time you emptied it.  Call your health care provider when the drain is putting out less than 10 mL of drainage per day for 2-3 days in a row or as directed by your health care provider.  Follow your health care provider's instructions for cleaning the drainage tube. You may need to clean the tube every day so that it does not clog. SEEK MEDICAL CARE IF:  You have increased bleeding (more than a small spot) from the site where the drainage tube was placed.  You have redness,  swelling, or increasing pain around the site where the drainage tube was placed.  You notice a discharge or bad smell coming from the site where the drainage tube was placed.  You have a fever or chills.  You have pain that is not helped by medicine.  SEEK IMMEDIATE MEDICAL CARE IF:  There is leakage around the drainage tube.  The drainage tube pulls out.  You suddenly stop having drainage from the tube.  You suddenly have blood in the drainage fluid.  You become dizzy or faint.  You develop a rash.   You have nausea or vomiting.  You have difficulty breathing, feel short of breath, or feel faint.   You develop chest pain.  You have problems with your speech or vision.  You have trouble balancing or moving your arms or legs. This information is not intended to replace advice given to you by your health care provider. Make sure you discuss any questions you have with your health care provider. Document Released: 02/22/2014 Document Revised: 07/27/2014 Document Reviewed: 02/22/2014 Elsevier Interactive Patient Education  2017 [place]. Moderate Conscious Sedation, Adult, Care After These instructions provide you with information about caring for yourself after your procedure. Your health care provider may also give you more specific instructions. Your treatment has been planned according to current medical practices, but problems sometimes occur. Call your health care provider if you have any problems or questions after your procedure. What can I expect after the procedure? After your procedure,  it is common:  To feel sleepy for several hours.  To feel clumsy and have poor balance for several hours.  To have poor judgment for several hours.  To vomit if you eat too soon. Follow these instructions at home: For at least 24 hours after the procedure:   Do not:  Participate in activities where you could fall or become injured.  Drive.  Use heavy  machinery.  Drink alcohol.  Take sleeping pills or medicines that cause drowsiness.  Make important decisions or sign legal documents.  Take care of children on your own.  Rest. Eating and drinking  Follow the diet recommended by your health care provider.  If you vomit:  Drink water, juice, or soup when you can drink without vomiting.  Make sure you have little or no nausea before eating solid foods. General instructions  Have a responsible adult stay with you until you are awake and alert.  Take over-the-counter and prescription medicines only as told by your health care provider.  If you smoke, do not smoke without supervision.  Keep all follow-up visits as told by your health care provider. This is important. Contact a health care provider if:  You keep feeling nauseous or you keep vomiting.  You feel light-headed.  You develop a rash.  You have a fever. Get help right away if:  You have trouble breathing. This information is not intended to replace advice given to you by your health care provider. Make sure you discuss any questions you have with your health care provider. Document Released: 07/29/2013 Document Revised: 03/12/2016 Document Reviewed: 01/28/2016 Elsevier Interactive Patient Education  2017 [employer].

## 2016-10-16 NOTE — Procedures (Signed)
R 10 Fr Int/Ext bili drain CHD occlusion No comp/EBL

## 2016-10-16 NOTE — Sedation Documentation (Signed)
Medicated for pain 

## 2016-10-16 NOTE — Sedation Documentation (Signed)
Grimacing and BP up. Medicated for pain

## 2016-10-16 NOTE — Sedation Documentation (Signed)
medicated for pain

## 2016-10-16 NOTE — Progress Notes (Addendum)
Subjective: Patient seen and examined. Chart reviewed. He is waitng to go to IR as his biliary l stent was occluded with a lot of debris, as mentioned by Dr. [name] on his EGD report. His wife who is bedside says he has been having a lot of itching. He would like something for that.  Objective: Vital signs in last 24 hours: Temp:  [97.4 F (36.3 C)-99.2 F (37.3 C)] 99.2 F (37.3 C) (12/26 0426) Pulse Rate:  [65-67] 67 (12/26 0426) Resp:  [16-18] 16 (12/26 0426) BP: (144-159)/(79-81) 159/81 (12/26 0426) SpO2:  [99 %-100 %] 99 % (12/26 0426) Last BM Date: 10/15/16  Intake/Output from previous day: 12/25 0701 - 12/26 0700 In: 670 [P.O.:560; I.V.:10; IV Piggyback:100] Out: -  Intake/Output this shift: No intake/output data recorded.  General appearance: alert, cooperative, appears stated age, fatigued, icteric Resp: clear to auscultation bilaterally Cardio: regular rate and rhythm, S1, S2 normal, no murmur, click, rub or gallop GI: soft, non-tender; bowel sounds normal; no masses,  no organomegaly Extremities: extremities normal, atraumatic, no cyanosis or edema  Lab Results:  Recent Labs  10/14/16 0458 10/15/16 0433 10/16/16 0409  WBC 4.1 3.2* 4.2  HGB 10.0* 9.9* 10.0*  HCT 29.5* 28.5* 28.3*  PLT 110* 114* 126*   BMET  Recent Labs  10/14/16 0458 10/15/16 0433 10/16/16 0409  NA 135 139 137  K 3.3* 3.4* 3.4*  CL 103 107 104  CO2 26 26 26   GLUCOSE 226* 195* 98  BUN 11 11 12   CREATININE 0.73 0.56* 0.64  CALCIUM 8.4* 8.7* 8.5*   LFT  Recent Labs  10/16/16 0409  PROT 6.2*  ALBUMIN 2.8*  AST 189*  ALT 166*  ALKPHOS 871*  BILITOT 5.0*   Medications: I have reviewed the patient's current medications.  Assessment/Plan: 1) Metastatic pancreatic cancer with obstructive jaundice [total bilirubin 5] causing pruritus-will await IR results and try Hydroxyzine for his itching. 2) Anemia of chronic disease.   . LOS: 4 days   Mandisa Persinger 10/16/2016, 11:21 AM

## 2016-10-16 NOTE — Care Management Important Message (Signed)
Important Message  Patient Details  Name: Jacob Hardin MRN: [id number] Date of Birth: 08/05/1946   Medicare Important Message Given:  Yes    Kerin Salen 10/16/2016, 11:30 AMImportant Message  Patient Details  Name: Jacob Hardin MRN: [id number] Date of Birth: 1946/09/19   Medicare Important Message Given:  Yes    Kerin Salen 10/16/2016, 11:29 AM

## 2016-10-17 DIAGNOSIS — C799 Secondary malignant neoplasm of unspecified site: Secondary | ICD-10-CM

## 2016-10-17 LAB — CBC WITH DIFFERENTIAL/PLATELET
BASOS ABS: 0 10*3/uL (ref 0.0–0.1)
Basophils Relative: 0 %
EOS ABS: 0.1 10*3/uL (ref 0.0–0.7)
EOS PCT: 2 %
HCT: 30.5 % — ABNORMAL LOW (ref 39.0–52.0)
HEMOGLOBIN: 10.5 g/dL — AB (ref 13.0–17.0)
LYMPHS PCT: 15 %
Lymphs Abs: 1 10*3/uL (ref 0.7–4.0)
MCH: 31.2 pg (ref 26.0–34.0)
MCHC: 34.4 g/dL (ref 30.0–36.0)
MCV: 90.5 fL (ref 78.0–100.0)
Monocytes Absolute: 0.7 10*3/uL (ref 0.1–1.0)
Monocytes Relative: 11 %
NEUTROS PCT: 72 %
Neutro Abs: 4.8 10*3/uL (ref 1.7–7.7)
PLATELETS: 170 10*3/uL (ref 150–400)
RBC: 3.37 MIL/uL — AB (ref 4.22–5.81)
RDW: 14.4 % (ref 11.5–15.5)
WBC: 6.6 10*3/uL (ref 4.0–10.5)

## 2016-10-17 LAB — COMPREHENSIVE METABOLIC PANEL
ALK PHOS: 828 U/L — AB (ref 38–126)
ALT: 150 U/L — AB (ref 17–63)
AST: 122 U/L — AB (ref 15–41)
Albumin: 2.7 g/dL — ABNORMAL LOW (ref 3.5–5.0)
Anion gap: 9 (ref 5–15)
BUN: 17 mg/dL (ref 6–20)
CHLORIDE: 99 mmol/L — AB (ref 101–111)
CO2: 27 mmol/L (ref 22–32)
CREATININE: 0.69 mg/dL (ref 0.61–1.24)
Calcium: 8.6 mg/dL — ABNORMAL LOW (ref 8.9–10.3)
GFR calc Af Amer: >60 mL/min (ref >60)
GFR calc non Af Amer: >60 mL/min (ref >60)
Glucose, Bld: 212 mg/dL — ABNORMAL HIGH (ref 65–99)
Potassium: 3.6 mmol/L (ref 3.5–5.1)
SODIUM: 135 mmol/L (ref 135–145)
Total Bilirubin: 5.8 mg/dL — ABNORMAL HIGH (ref 0.3–1.2)
Total Protein: 6 g/dL — ABNORMAL LOW (ref 6.5–8.1)

## 2016-10-17 LAB — CULTURE, BLOOD (ROUTINE X 2)
Culture: NO GROWTH
Culture: NO GROWTH

## 2016-10-17 LAB — GLUCOSE, CAPILLARY
GLUCOSE-CAPILLARY: 216 mg/dL — AB (ref 65–99)
Glucose-Capillary: 167 mg/dL — ABNORMAL HIGH (ref 65–99)
Glucose-Capillary: 338 mg/dL — ABNORMAL HIGH (ref 65–99)
Glucose-Capillary: 273 mg/dL — ABNORMAL HIGH (ref 65–99)

## 2016-10-17 LAB — MAGNESIUM: Magnesium: 2 mg/dL (ref 1.7–2.4)

## 2016-10-17 NOTE — Progress Notes (Signed)
Patient ID: Jacob Hardin, male   DOB: 1946-03-19, 70 y.o.   MRN: [id number]    Referring Physician(s): Dr. Hazeline Junker  Supervising Physician: Oley Balm  Patient Status: [facility] - In-pt  Chief Complaint: Obstructive jaundice  Subjective: Patient was doing well until he got his morning medications this morning and developed some nausea.  Abdominal pain is improved.  Allergies: Codeine  Medications: Prior to Admission medications   Medication Sig Start Date End Date Taking? Authorizing Provider  baclofen (LIORESAL) 10 MG tablet Take 5 mg by mouth 3 (three) times daily as needed (for hiccups).   Yes Historical Provider, MD  esomeprazole (NEXIUM) 40 MG capsule Take 40 mg by mouth daily.   Yes Historical Provider, MD  insulin aspart (NOVOLOG FLEXPEN) 100 UNIT/ML FlexPen Inject 4 Units into the skin daily before supper.   Yes Historical Provider, MD  Insulin Detemir (LEVEMIR FLEXTOUCH) 100 UNIT/ML Pen Inject 40 Units into the skin daily.   Yes Historical Provider, MD  lidocaine-prilocaine (EMLA) cream Apply 1 application topically as needed (prior to accessing port).   Yes Historical Provider, MD  lipase/protease/amylase (CREON) 36000 UNITS CPEP capsule Take 1 capsule (36,000 Units total) by mouth 3 (three) times daily before meals. 08/22/16  Yes Ladene Artist, MD  loperamide (IMODIUM) 2 MG capsule Take 2 mg by mouth as needed for diarrhea or loose stools.   Yes Historical Provider, MD  loratadine (CLARITIN) 10 MG tablet Take 10 mg by mouth daily.    Yes Historical Provider, MD  Multiple Vitamin (MULTIVITAMIN WITH MINERALS) TABS tablet Take 1 tablet by mouth daily.   Yes Historical Provider, MD  ondansetron (ZOFRAN) 8 MG tablet Take 8 mg by mouth every 6 (six) hours as needed for nausea or vomiting.    Yes Historical Provider, MD  polyethylene glycol (MIRALAX / GLYCOLAX) packet Take 17 g by mouth daily as needed for mild constipation.    Yes Historical Provider, MD  prochlorperazine  (COMPAZINE) 10 MG tablet Take 1 tablet (10 mg total) by mouth every 6 (six) hours as needed for nausea or vomiting. 12/02/15  Yes Ladene Artist, MD    Vital Signs: BP (!) 157/83 (BP Location: Left Arm)   Pulse 98   Temp 98.7 F (37.1 C) (Oral)   Resp 16   Ht 6' (1.829 m)   Wt 162 lb 4.1 oz (73.6 kg)   SpO2 98%   BMI 22.01 kg/m   Physical Exam: Abd: soft, minimally tender in RUQ, but improved.  Drain in place with bilious output.  Drain site is c/d/i  Imaging: Ct Abdomen Pelvis W Contrast  Result Date: 10/13/2016 CLINICAL DATA:  History of pancreatic carcinoma with fever and chills EXAM: CT ABDOMEN AND PELVIS WITH CONTRAST TECHNIQUE: Multidetector CT imaging of the abdomen and pelvis was performed using the standard protocol following bolus administration of intravenous contrast. CONTRAST:  ISOVUE-300 IOPAMIDOL (ISOVUE-300) INJECTION 61%, 61mL ISOVUE-300 IOPAMIDOL (ISOVUE-300) INJECTION 61% COMPARISON:  07/30/2016, 10/11/2016 FINDINGS: Lower chest: No acute abnormality. Hepatobiliary: Scattered hypodensities are again identified throughout the liver consistent with metastatic disease. The overall appearance is stable. Some differential in the enhancement of the liver is noted related to the timing of the contrast bolus. No significant biliary ductal dilatation is seen. The gallbladder is well distended. Common bile duct stent is noted in satisfactory position. Cement soft tissue density is noted within which may represent debris. Pancreas: Fullness in the pancreatic head is again noted consistent with the patient's given clinical history  of pancreatic carcinoma. Common bile duct stent courses through this area. Spleen: Normal in size without focal abnormality. Adrenals/Urinary Tract: Adrenal glands are unremarkable. Kidneys are normal, without renal calculi, focal lesion, or hydronephrosis. Bladder is unremarkable. Stomach/Bowel: Stent is noted within the duodenum proximally which appears  patent. The appendix is within normal limits. Scattered diverticular change of the colon is noted without diverticulitis. No obstructive changes are seen. The CT Um and proximal portion of the ascending colon are predominately decompressed with some mild pericolonic inflammatory changes. This may represent some focal colitis. Vascular/Lymphatic: Aortic atherosclerosis. No enlarged abdominal or pelvic lymph nodes. Reproductive: Prostate is unremarkable. Other: Minimal ascites is noted along the liver. Musculoskeletal: No acute or significant osseous findings. IMPRESSION: Changes consistent with the known history of pancreatic head mass with liver metastatic disease. Duodenal and common bile duct stents are noted in place. Changes suggestive of focal colitis within the right colon Electronically Signed   By: Alcide Clever M.D.   On: 10/13/2016 17:25   Ir Biliary Drain Placement With Cholangiogram  Result Date: 10/16/2016 INDICATION: Biliary obstruction.  Pancreatic cancer. EXAM: IR BILIARY DRAIN PLACEMENT W/ CHOLANGIOGRAM MEDICATIONS: Patient is on antibiotics; The antibiotic was administered within an appropriate time frame prior to the initiation of the procedure. ANESTHESIA/SEDATION: Moderate (conscious) sedation was employed during this procedure. A total of Versed 4 mg and Fentanyl 250 mcg was administered intravenously. Moderate Sedation Time: 49 minutes. The patient's level of consciousness and vital signs were monitored continuously by radiology nursing throughout the procedure under my direct supervision. FLUOROSCOPY TIME:  Fluoroscopy Time: 18 minutes 48 seconds (556 mGy). COMPLICATIONS: None immediate. PROCEDURE: Informed written consent was obtained from the patient after a thorough discussion of the procedural risks, benefits and alternatives. All questions were addressed. Maximal Sterile Barrier Technique was utilized including caps, mask, sterile gowns, sterile gloves, sterile drape, hand hygiene  and skin antiseptic. A timeout was performed prior to the initiation of the procedure. The right flank was prepped and draped in a sterile fashion. 1% lidocaine was utilized for local anesthesia. Under sonographic guidance, a Chiba needle was advanced into the common hepatic duct. Contrast was injected opacifying the biliary system. A 21 gauge needle was then utilized to puncture a peripheral right hepatic duct under fluoroscopic guidance. It was removed over a 018 wire which was up sized to a 3 J. A 5 French Kumpe catheter was advanced over the 3 J. The catheter was then advanced over a glidewire into the duodenum across the common hepatic duct obstruction. The Kumpe be catheter was then removed over an Amplatz wire. A 10 [place] biliary drain was then advanced over the Amplatz wire. The tip was coiled in the duodenum. It was string fixed. Contrast was injected. FINDINGS: The initial cholangiogram confirms common hepatic duct obstruction above the existing stent. Subsequent images demonstrate access into the biliary system via a peripheral right hepatic duct. The final image demonstrates a right internal external biliary drain with its tip coiled in the duodenum. IMPRESSION: Initial cholangiography confirms biliary obstruction in the common hepatic duct above the extent of the stent. A right internal external biliary drain was successful with its tip coiled in the duodenum. The patient will return for follow-up cholangiogram and possible stent placement. Electronically Signed   By: Jolaine Click M.D.   On: 10/16/2016 16:52    Labs:  CBC:  Recent Labs  10/14/16 0458 10/15/16 0433 10/16/16 0409 10/17/16 0520  WBC 4.1 3.2* 4.2 6.6  HGB 10.0* 9.9*  10.0* 10.5*  HCT 29.5* 28.5* 28.3* 30.5*  PLT 110* 114* 126* 170    COAGS:  Recent Labs  11/25/15 0753 11/30/15 1130 12/13/15 0750 10/12/16 2021  INR 1.07 1.10 1.08 0.98  APTT '27 28 26 30    '$ BMP:  Recent Labs  10/14/16 0458 10/15/16 0433  10/16/16 0409 10/17/16 0520  NA 135 139 137 135  K 3.3* 3.4* 3.4* 3.6  CL 103 107 104 99*  CO2 '26 26 26 27  '$ GLUCOSE 226* 195* 98 212*  BUN '11 11 12 17  '$ CALCIUM 8.4* 8.7* 8.5* 8.6*  CREATININE 0.73 0.56* 0.64 0.69  GFRNONAA >60 >60 >60 >60  GFRAA >60 >60 >60 >60    LIVER FUNCTION TESTS:  Recent Labs  10/14/16 0458 10/15/16 0433 10/16/16 0409 10/17/16 0520  BILITOT 5.1* 5.5* 5.0* 5.8*  AST 118* 154* 189* 122*  ALT 138* 147* 166* 150*  ALKPHOS 699* 753* 871* 828*  PROT 6.0* 5.9* 6.2* 6.0*  ALBUMIN 2.8* 2.8* 2.8* 2.7*    Assessment and Plan: 1. Pancreatic cancer with obstructive jaundice, s/p PTC with I/E bili drain placement 12/26 -LFTs are relatively stable.  TB up slightly, but other LFTs are slightly down -abdominal pain is improved -cont drain for now.  At some point, may try and cap the drain, but will give it some time prior to this. -we will follow  Electronically Signed: Antavious Spanos E 10/17/2016, 9:50 AM   I spent a total of 15 Minutes at the the patient's bedside AND on the patient's hospital floor or unit, greater than 50% of which was counseling/coordinating care for obstructive jaundice

## 2016-10-17 NOTE — Progress Notes (Signed)
Subjective: Since I last evaluated the patient, he seems to be doing much better as he has had the biliary drain placed by IR. The Vistaril is helping with the itching for about 2-3 hours. He has some abdominal pain in certain positions but it is not very severe. He has not had a BM today. He took some Miralax this morning.  Objective: Vital signs in last 24 hours: Temp:  [98.3 F (36.8 C)-99 F (37.2 C)] 99 F (37.2 C) (12/27 1500) Pulse Rate:  [66-98] 66 (12/27 1500) Resp:  [16-18] 18 (12/27 1500) BP: (145-164)/(79-83) 145/81 (12/27 1500) SpO2:  [97 %-98 %] 97 % (12/27 1500) Last BM Date: 10/15/16  Intake/Output from previous day: 12/26 0701 - 12/27 0700 In: 110 [I.V.:10; IV Piggyback:100] Out: 250 [Drains:250] Intake/Output this shift: No intake/output data recorded.  General appearance: alert, cooperative, icteric and no distress Resp: clear to auscultation bilaterally Cardio: regular rate and rhythm, S1, S2 normal, no murmur, click, rub or gallop GI: soft, non-tender; bowel sounds normal; no masses,  no organomegaly Extremities: extremities normal, atraumatic, no cyanosis or edema  Lab Results:  Recent Labs  10/15/16 0433 10/16/16 0409 10/17/16 0520  WBC 3.2* 4.2 6.6  HGB 9.9* 10.0* 10.5*  HCT 28.5* 28.3* 30.5*  PLT 114* 126* 170   BMET  Recent Labs  10/15/16 0433 10/16/16 0409 10/17/16 0520  NA 139 137 135  K 3.4* 3.4* 3.6  CL 107 104 99*  CO2 26 26 27   GLUCOSE 195* 98 212*  BUN 11 12 17   CREATININE 0.56* 0.64 0.69  CALCIUM 8.7* 8.5* 8.6*   LFT  Recent Labs  10/17/16 0520  PROT 6.0*  ALBUMIN 2.7*  AST 122*  ALT 150*  ALKPHOS 828*  BILITOT 5.8*   Studies/Results: Ir Biliary Drain Placement With Cholangiogram  Result Date: 10/16/2016 INDICATION: Biliary obstruction.  Pancreatic cancer. EXAM: IR BILIARY DRAIN PLACEMENT W/ CHOLANGIOGRAM MEDICATIONS: Patient is on antibiotics; The antibiotic was administered within an appropriate time frame  prior to the initiation of the procedure. ANESTHESIA/SEDATION: Moderate (conscious) sedation was employed during this procedure. A total of Versed 4 mg and Fentanyl 250 mcg was administered intravenously. Moderate Sedation Time: 49 minutes. The patient's level of consciousness and vital signs were monitored continuously by radiology nursing throughout the procedure under my direct supervision. FLUOROSCOPY TIME:  Fluoroscopy Time: 18 minutes 48 seconds (556 mGy). COMPLICATIONS: None immediate. PROCEDURE: Informed written consent was obtained from the patient after a thorough discussion of the procedural risks, benefits and alternatives. All questions were addressed. Maximal Sterile Barrier Technique was utilized including caps, mask, sterile gowns, sterile gloves, sterile drape, hand hygiene and skin antiseptic. A timeout was performed prior to the initiation of the procedure. The right flank was prepped and draped in a sterile fashion. 1% lidocaine was utilized for local anesthesia. Under sonographic guidance, a Chiba needle was advanced into the common hepatic duct. Contrast was injected opacifying the biliary system. A 21 gauge needle was then utilized to puncture a peripheral right hepatic duct under fluoroscopic guidance. It was removed over a 018 wire which was up sized to a 3 J. A 5 French Kumpe catheter was advanced over the 3 J. The catheter was then advanced over a glidewire into the duodenum across the common hepatic duct obstruction. The Kumpe be catheter was then removed over an Amplatz wire. A 10 [place] biliary drain was then advanced over the Amplatz wire. The tip was coiled in the duodenum. It was string fixed. Contrast was  injected. FINDINGS: The initial cholangiogram confirms common hepatic duct obstruction above the existing stent. Subsequent images demonstrate access into the biliary system via a peripheral right hepatic duct. The final image demonstrates a right internal external biliary drain  with its tip coiled in the duodenum. IMPRESSION: Initial cholangiography confirms biliary obstruction in the common hepatic duct above the extent of the stent. A right internal external biliary drain was successful with its tip coiled in the duodenum. The patient will return for follow-up cholangiogram and possible stent placement. Electronically Signed   By: Marybelle Killings M.D.   On: 10/16/2016 16:52   Medications: I have reviewed the patient's current medications.  Assessment/Plan: 1) Metastatic pancreatic cancer with a bilary drain placed for an obstructed biliary stent-TB improving.  2) Anemia of chronic disease.  3) Chronic constipation-on Miralax PRN.  4) Pruritis improved with Vistaril. Will check with pharmacy to see if we can increase the frequency of administration of the Vistaril.  LOS: 5 days   Vlasta Baskin 10/17/2016, 7:35 PM

## 2016-10-17 NOTE — Progress Notes (Signed)
PROGRESS NOTE  Jacob Hardin  [id number] DOB: 10/17/1946 DOA: 10/12/2016 PCP: [name], MD  Outpatient Specialists: Dr [name], gastroenterology  Brief Narrative: Jacob Hardin is a 70 y.o. 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 [name], (had placement of a percutaneous internal/external biliary drain 11/25/2015; biliary drainage catheter capped 12/02/2015; biliary drain internalized 12/13/2015), HTN, HLD, DM, and GERD, who presented 12/22 with chills. He had no other significant symptoms, but had similar presentation in February of this year that developed into sepsis. On arrival, work up was significant for abdominal U/S showing sludge with probable nonshadowing gallstones in thickened edematous gallbladder as well as dilated biliary ducts with air. WBC 3.2, lactate 1.37, negative urinalysis, potassium 3.4, creatinine 1.04, abnormal liver function with total bilirubin 5.8, ALP 871, AST 175 and ALT 173. temperature 100.2, tachycardia, oxygen saturation 97% on room air. Oncology and GI were consulted and he was admitted for treatment of sepsis due to presumed acute cholecystitis. Subsequent CT showed distended GB without evidence of biliary obstruction. EGD 12/24 showed obstructing debris in biliary stent. IR consulted and planning percutaneous transhepatic cholangiogram/drain placement in IR 12/26.  Assessment & Plan: Principal Problem:   Sepsis ([place]) Active Problems:   Diabetes mellitus without complication ([place])   Cancer of head of pancreas ([place])   Metastatic cancer ([place])   Immunocompromised patient ([place])   Malnutrition of moderate degree   GERD (gastroesophageal reflux disease)   Hyperbilirubinemia   Hypokalemia  Sepsis possibly due to cholestasis: Patient has abnormal liver function with increased total bilirubin plus abdominal ultrasound  findings, consistent with biliary system infection. Sepsis features resolving.  -Hemodynamically stable currently. 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 Will follow cx's results  - Continue IVF's, antipyretics and supportive care - Continue cefepime for now - Blood cultures x 2 NGTD - Lactate wnl. - Monitor CBC w/diff: WBC stable now  Biliary obstruction/hyperbilirubinemia:  -T Bili 4.8 on admission, slowly rising as outpatient with evidence of GB inflammation on U/S.  -CT showed distended gallbladder without inflammation. No evidence of hemolysis. EGD 12/24 showed biliary stent occlusion.  - Discussed with IR: who has performed percutaneous transhepatic cholangiogram/drain placement in IR 12/26 -continue Hydroxyzine prn itching -will follow post-procedure rec's  Cancer of head of pancreas, metastasized to liver and duodenum: Patient has been followed up by Dr. [name].  Patient is on chemotherapy, last dose was 3 weeks ago, recently held for hyperbilirubinemia. CA-19-9 was 283 on 10/10/16 - f/u with Dr. [name] as outpatient  Hypokalemia: K= 3.4, replete and recheck. Due to poor po from frequent NPO status for procedures  DM-II: Last A1c 7.0 on 09/19/15, well controled. Patient is taking Levemir at home -will continue adjusted lantus dose while inpatient  -SSI -follow CBG  Malnutrition of moderate degree: -Nutrition consult requested  -will follow rec's for feeding supplements   GERD: -Protonix  DVT prophylaxis: SCDs Code Status: Partial code, DNI, otherwise all resuscitative measures.  Family Communication: Wife at bedside Disposition Plan: Continued treatment of sepsis, pending further  IR procedure/rec's  Consultants:   Gastroenterology, Dr. [name]  Oncology   Interventional radiology  Procedures:   EGD 12/24  Antimicrobials:  Cefepime 12/22 >>    Subjective: Pt feels improved, no chills and no fever this morning. Still jaundiced with  increasing pruritus. Denies abd pain and vomiting. Endorses no BM's and positive nausea.  Objective: Vitals:   10/16/16  2112 10/17/16 0507 10/17/16 1500 10/17/16 2126  BP: (!) 164/79 (!) 157/83 (!) 145/81 (!) 165/76  Pulse: 98 98 66 78  Resp: 18 16 18 18   Temp: 98.3 F (36.8 C) 98.7 F (37.1 C) 99 F (37.2 C) (!) 101.3 F (38.5 C)  TempSrc: Oral Oral Oral Oral  SpO2: 98% 98% 97% 97%  Weight:      Height:        Intake/Output Summary (Last 24 hours) at 10/17/16 2236 Last data filed at 10/17/16 2157  Gross per 24 hour  Intake               60 ml  Output              600 ml  Net             -540 ml   Filed Weights   10/12/16 2300  Weight: 73.6 kg (162 lb 4.1 oz)    Examination: General exam: Jaundiced 70 y.o. male in no distress, reports some intermittent nausea, but denies vomiting. Respiratory system: Non-labored breathing room air. Clear to auscultation bilaterally.  Cardiovascular system: Regular rate and rhythm. No murmur, rub, or gallop. No JVD, and no pedal edema. Gastrointestinal system: Abdomen soft, absolutely not tender, non-distended, with normoactive bowel sounds. No organomegaly or masses felt. Central nervous system: Alert and oriented. No focal neurological deficits. Extremities: Warm, no deformities Skin: No rashes. Right upper quadrant with percutaneous biliary drain; good drainage, no erythema.  Psychiatry: Judgement and insight appear normal. Mood & affect appropriate.   Data Reviewed: I have personally reviewed following labs and imaging studies  CBC:  Recent Labs Lab 10/12/16 1612 10/13/16 0440 10/14/16 0458 10/15/16 0433 10/16/16 0409 10/17/16 0520  WBC 3.2* 6.9 4.1 3.2* 4.2 6.6  NEUTROABS 2.8  --   --   --  2.6 4.8  HGB 10.1* 10.7* 10.0* 9.9* 10.0* 10.5*  HCT 29.9* 31.1* 29.5* 28.5* 28.3* 30.5*  MCV 92.6 92.3 90.5 90.2 89.0 90.5  PLT 128* 141* 110* 114* 126* [id number]   Basic Metabolic Panel:  Recent Labs Lab 10/13/16 0440 10/14/16 0458  10/15/16 0433 10/16/16 0409 10/17/16 0520  NA 139 135 139 137 135  K 3.8 3.3* 3.4* 3.4* 3.6  CL 108 103 107 104 99*  CO2 25 26 26 26 27   GLUCOSE 133* 226* 195* 98 212*  BUN 11 11 11 12 17   CREATININE 0.59* 0.73 0.56* 0.64 0.69  CALCIUM 8.4* 8.4* 8.7* 8.5* 8.6*  MG  --   --   --   --  2.0   GFR: Estimated Creatinine Clearance: 92 mL/min (by C-G formula based on SCr of 0.69 mg/dL).   Liver Function Tests:  Recent Labs Lab 10/13/16 0440 10/14/16 0458 10/15/16 0433 10/16/16 0409 10/17/16 0520  AST 117* 118* 154* 189* 122*  ALT 155* 138* 147* 166* 150*  ALKPHOS 798* 699* 753* 871* 828*  BILITOT 4.8* 5.1* 5.5* 5.0* 5.8*  PROT 6.0* 6.0* 5.9* 6.2* 6.0*  ALBUMIN 3.0* 2.8* 2.8* 2.8* 2.7*    Recent Labs Lab 10/12/16 1612  LIPASE <10*  AMYLASE 42    Recent Labs Lab 10/12/16 1612  AMMONIA 43*   Coagulation Profile:  Recent Labs Lab 10/12/16 2021  INR 0.98   CBG:  Recent Labs Lab 10/16/16 2158 10/17/16 0751 10/17/16 1256 10/17/16 1639 10/17/16 2125  GLUCAP 252* 167* 338* 273* 216*   Urine analysis:    Component Value Date/Time   COLORURINE AMBER (A) 10/12/2016 1540  APPEARANCEUR CLEAR 10/12/2016 1540   LABSPEC 1.035 (H) 10/12/2016 1540   PHURINE 5.0 10/12/2016 1540   GLUCOSEU >=500 (A) 10/12/2016 1540   HGBUR SMALL (A) 10/12/2016 1540   BILIRUBINUR MODERATE (A) 10/12/2016 1540   KETONESUR NEGATIVE 10/12/2016 1540   PROTEINUR 30 (A) 10/12/2016 1540   NITRITE NEGATIVE 10/12/2016 1540   LEUKOCYTESUR NEGATIVE 10/12/2016 1540   Sepsis Labs: @LABRCNTIP (procalcitonin:4,lacticidven:4)  ) Recent Results (from the past 240 hour(s))  Culture, blood (x 2)     Status: None   Collection Time: 10/12/16  4:12 PM  Result Value Ref Range Status   Specimen Description PORTA CATH RIGHT  Final   Special Requests BOTTLES DRAWN AEROBIC AND ANAEROBIC 5CC  Final   Culture   Final    NO GROWTH 5 DAYS Performed at [facility]    Report Status 10/17/2016  FINAL  Final  Respiratory Panel by PCR     Status: None   Collection Time: 10/12/16  7:00 PM  Result Value Ref Range Status   Adenovirus NOT DETECTED NOT DETECTED Final   Coronavirus 229E NOT DETECTED NOT DETECTED Final   Coronavirus HKU1 NOT DETECTED NOT DETECTED Final   Coronavirus NL63 NOT DETECTED NOT DETECTED Final   Coronavirus OC43 NOT DETECTED NOT DETECTED Final   Metapneumovirus NOT DETECTED NOT DETECTED Final   Rhinovirus / Enterovirus NOT DETECTED NOT DETECTED Final   Influenza A NOT DETECTED NOT DETECTED Final   Influenza B NOT DETECTED NOT DETECTED Final   Parainfluenza Virus 1 NOT DETECTED NOT DETECTED Final   Parainfluenza Virus 2 NOT DETECTED NOT DETECTED Final   Parainfluenza Virus 3 NOT DETECTED NOT DETECTED Final   Parainfluenza Virus 4 NOT DETECTED NOT DETECTED Final   Respiratory Syncytial Virus NOT DETECTED NOT DETECTED Final   Bordetella pertussis NOT DETECTED NOT DETECTED Final   Chlamydophila pneumoniae NOT DETECTED NOT DETECTED Final   Mycoplasma pneumoniae NOT DETECTED NOT DETECTED Final    Comment: Performed at [facility]  Culture, blood (x 2)     Status: None   Collection Time: 10/12/16  8:17 PM  Result Value Ref Range Status   Specimen Description RIGHT ANTECUBITAL  Final   Special Requests BOTTLES DRAWN AEROBIC AND ANAEROBIC 5CC  Final   Culture   Final    NO GROWTH 5 DAYS Performed at [facility]    Report Status 10/17/2016 FINAL  Final     Radiology Studies: Ir Biliary Drain Placement With Cholangiogram  Result Date: 10/16/2016 INDICATION: Biliary obstruction.  Pancreatic cancer. EXAM: IR BILIARY DRAIN PLACEMENT W/ CHOLANGIOGRAM MEDICATIONS: Patient is on antibiotics; The antibiotic was administered within an appropriate time frame prior to the initiation of the procedure. ANESTHESIA/SEDATION: Moderate (conscious) sedation was employed during this procedure. A total of Versed 4 mg and Fentanyl 250 mcg was administered  intravenously. Moderate Sedation Time: 49 minutes. The patient's level of consciousness and vital signs were monitored continuously by radiology nursing throughout the procedure under my direct supervision. FLUOROSCOPY TIME:  Fluoroscopy Time: 18 minutes 48 seconds (556 mGy). COMPLICATIONS: None immediate. PROCEDURE: Informed written consent was obtained from the patient after a thorough discussion of the procedural risks, benefits and alternatives. All questions were addressed. Maximal Sterile Barrier Technique was utilized including caps, mask, sterile gowns, sterile gloves, sterile drape, hand hygiene and skin antiseptic. A timeout was performed prior to the initiation of the procedure. The right flank was prepped and draped in a sterile fashion. 1% lidocaine was utilized for local anesthesia.  Under sonographic guidance, a Chiba needle was advanced into the common hepatic duct. Contrast was injected opacifying the biliary system. A 21 gauge needle was then utilized to puncture a peripheral right hepatic duct under fluoroscopic guidance. It was removed over a 018 wire which was up sized to a 3 J. A 5 French Kumpe catheter was advanced over the 3 J. The catheter was then advanced over a glidewire into the duodenum across the common hepatic duct obstruction. The Kumpe be catheter was then removed over an Amplatz wire. A 10 [place] biliary drain was then advanced over the Amplatz wire. The tip was coiled in the duodenum. It was string fixed. Contrast was injected. FINDINGS: The initial cholangiogram confirms common hepatic duct obstruction above the existing stent. Subsequent images demonstrate access into the biliary system via a peripheral right hepatic duct. The final image demonstrates a right internal external biliary drain with its tip coiled in the duodenum. IMPRESSION: Initial cholangiography confirms biliary obstruction in the common hepatic duct above the extent of the stent. A right internal external  biliary drain was successful with its tip coiled in the duodenum. The patient will return for follow-up cholangiogram and possible stent placement. Electronically Signed   By: Marybelle Killings M.D.   On: 10/16/2016 16:52    Scheduled Meds: . ceFEPime (MAXIPIME) IV  2 g Intravenous BID  . insulin aspart  0-9 Units Subcutaneous TID WC  . insulin detemir  30 Units Subcutaneous Daily  . lipase/protease/amylase  36,000 Units Oral TID AC  . loratadine  10 mg Oral Daily  . multivitamin with minerals  1 tablet Oral Daily  . pantoprazole  40 mg Oral Daily  . potassium chloride  30 mEq Oral Once  . sodium chloride flush  3 mL Intravenous Q12H   Continuous Infusions:    LOS: 5 days   Time spent: 25 minutes.  Barton Dubois, MD [facility] Pager [phone]  If 7PM-7AM, please contact night-coverage [email or web address] Password [facility] 10/17/2016, 10:36 PM

## 2016-10-18 ENCOUNTER — Encounter: Payer: Self-pay | Admitting: *Deleted

## 2016-10-18 DIAGNOSIS — E876 Hypokalemia: Secondary | ICD-10-CM

## 2016-10-18 LAB — COMPREHENSIVE METABOLIC PANEL
ALT: 103 U/L — AB (ref 17–63)
ANION GAP: 7 (ref 5–15)
AST: 55 U/L — ABNORMAL HIGH (ref 15–41)
Albumin: 2.4 g/dL — ABNORMAL LOW (ref 3.5–5.0)
Alkaline Phosphatase: 622 U/L — ABNORMAL HIGH (ref 38–126)
BUN: 13 mg/dL (ref 6–20)
CHLORIDE: 102 mmol/L (ref 101–111)
CO2: 30 mmol/L (ref 22–32)
Calcium: 8.6 mg/dL — ABNORMAL LOW (ref 8.9–10.3)
Creatinine, Ser: 0.62 mg/dL (ref 0.61–1.24)
Glucose, Bld: 125 mg/dL — ABNORMAL HIGH (ref 65–99)
POTASSIUM: 3.6 mmol/L (ref 3.5–5.1)
SODIUM: 139 mmol/L (ref 135–145)
Total Bilirubin: 4.9 mg/dL — ABNORMAL HIGH (ref 0.3–1.2)
Total Protein: 5.5 g/dL — ABNORMAL LOW (ref 6.5–8.1)

## 2016-10-18 LAB — CBC WITH DIFFERENTIAL/PLATELET
Basophils Absolute: 0 10*3/uL (ref 0.0–0.1)
Basophils Relative: 1 %
EOS ABS: 0.1 10*3/uL (ref 0.0–0.7)
EOS PCT: 2 %
HCT: 27 % — ABNORMAL LOW (ref 39.0–52.0)
Hemoglobin: 9.2 g/dL — ABNORMAL LOW (ref 13.0–17.0)
LYMPHS ABS: 1 10*3/uL (ref 0.7–4.0)
Lymphocytes Relative: 17 %
MCH: 31 pg (ref 26.0–34.0)
MCHC: 34.1 g/dL (ref 30.0–36.0)
MCV: 90.9 fL (ref 78.0–100.0)
MONO ABS: 0.7 10*3/uL (ref 0.1–1.0)
Monocytes Relative: 11 %
Neutro Abs: 4.4 10*3/uL (ref 1.7–7.7)
Neutrophils Relative %: 71 %
PLATELETS: 147 10*3/uL — AB (ref 150–400)
RBC: 2.97 MIL/uL — AB (ref 4.22–5.81)
RDW: 14.2 % (ref 11.5–15.5)
WBC: 6.3 10*3/uL (ref 4.0–10.5)

## 2016-10-18 LAB — GLUCOSE, CAPILLARY
GLUCOSE-CAPILLARY: 114 mg/dL — AB (ref 65–99)
GLUCOSE-CAPILLARY: 393 mg/dL — AB (ref 65–99)
GLUCOSE-CAPILLARY: 116 mg/dL — AB (ref 65–99)
Glucose-Capillary: 283 mg/dL — ABNORMAL HIGH (ref 65–99)

## 2016-10-18 MED ORDER — HYDROXYZINE HCL 10 MG PO TABS
10.0000 mg | ORAL_TABLET | ORAL | Status: DC | PRN
Start: 2016-10-18 — End: 2016-10-18
  Administered 2016-10-18: 10 mg via ORAL
  Filled 2016-10-18 (×2): qty 1

## 2016-10-18 MED ORDER — PIPERACILLIN-TAZOBACTAM 3.375 G IVPB
3.3750 g | Freq: Three times a day (TID) | INTRAVENOUS | Status: DC
  Administered 2016-10-18 – 2016-10-20 (×7): 3.375 g via INTRAVENOUS
  Filled 2016-10-18 (×7): qty 50

## 2016-10-18 MED ORDER — HYDROXYZINE HCL 25 MG PO TABS
25.0000 mg | ORAL_TABLET | Freq: Three times a day (TID) | ORAL | Status: DC | PRN
  Administered 2016-10-18 – 2016-10-19 (×3): 25 mg via ORAL
  Filled 2016-10-18 (×3): qty 1

## 2016-10-18 MED ORDER — DOXYLAMINE SUCCINATE (SLEEP) 25 MG PO TABS
25.0000 mg | ORAL_TABLET | Freq: Every evening | ORAL | Status: DC | PRN
  Administered 2016-10-18 – 2016-10-19 (×2): 25 mg via ORAL
  Filled 2016-10-18 (×3): qty 1

## 2016-10-18 NOTE — Progress Notes (Addendum)
Pharmacy Antibiotic Note  Jacob Hardin is a 70 y.o. 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 Also considering UTI, but UA negative and no urinary Sx. Pharmacy has been consulted for Cefepime dosing. Last chemo 12/1, and noted ANC 2.8  Today, 10/18/2016:  D7 abx  Temp to 101.3 overnight  Plan:  Will switch Cefepime to Zosyn 3.375 g IV every 8 hrs by 4-hr infusion to add anaerobic coverage   Height: 6' (182.9 cm) Weight: 162 lb 4.1 oz (73.6 kg) IBW/kg (Calculated) : 77.6  Temp (24hrs), Avg:99.5 F (37.5 C), Min:98.2 F (36.8 C), Max:101.3 F (38.5 C)   Recent Labs Lab 10/12/16 1621 10/12/16 2021 10/12/16 2302  10/14/16 0458 10/15/16 0433 10/16/16 0409 10/17/16 0520 10/18/16 0335  WBC  --   --   --   < > 4.1 3.2* 4.2 6.6 6.3  CREATININE  --   --   --   < > 0.73 0.56* 0.64 0.69 0.62  LATICACIDVEN 1.37 1.5 1.9  --   --   --   --   --   --   < > = values in this interval not displayed.  Estimated Creatinine Clearance: 92 mL/min (by C-G formula based on SCr of 0.62 mg/dL).    Allergies  Allergen Reactions  . Codeine Nausea Only    Antimicrobials this admission:  12/22 cefepime >> 12/28 12/28 Zosyn >>  Dose adjustments this admission:   Microbiology results:  12/22 BCx: [id number] 12/22 resp pcr : neg  Thank you for allowing pharmacy to be a part of this patient's care.  Reuel Boom, PharmD, BCPS Pager: [phone] 10/18/2016, 11:41 AM

## 2016-10-18 NOTE — Progress Notes (Signed)
PROGRESS NOTE  Jacob Hardin  [id number] DOB: 1945-11-12 DOA: 10/12/2016 PCP: [name], MD  Outpatient Specialists: Dr [name], gastroenterology  Brief Narrative: Jacob Hardin is a 70 y.o. 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 [name], (had placement of a percutaneous internal/external biliary drain 11/25/2015; biliary drainage catheter capped 12/02/2015; biliary drain internalized 12/13/2015), HTN, HLD, DM, and GERD, who presented 12/22 with chills. He had no other significant symptoms, but had similar presentation in February of this year that developed into sepsis. On arrival, work up was significant for abdominal U/S showing sludge with probable nonshadowing gallstones in thickened edematous gallbladder as well as dilated biliary ducts with air. WBC 3.2, lactate 1.37, negative urinalysis, potassium 3.4, creatinine 1.04, abnormal liver function with total bilirubin 5.8, ALP 871, AST 175 and ALT 173. temperature 100.2, tachycardia, oxygen saturation 97% on room air. Oncology and GI were consulted and he was admitted for treatment of sepsis due to presumed acute cholecystitis. Subsequent CT showed distended GB without evidence of biliary obstruction. EGD 12/24 showed obstructing debris in biliary stent. IR consulted and planning percutaneous transhepatic cholangiogram/drain placement in IR 12/26.  Assessment & Plan: Principal Problem:   Sepsis ([place]) Active Problems:   Diabetes mellitus without complication ([place])   Cancer of head of pancreas ([place])   Metastatic cancer ([place])   Immunocompromised patient ([place])   Malnutrition of moderate degree   GERD (gastroesophageal reflux disease)   Hyperbilirubinemia   Hypokalemia  Sepsis possibly due to cholestasis: Patient has abnormal liver function with increased total bilirubin plus abdominal ultrasound  findings, consistent with biliary system infection. Sepsis features resolving.  -Hemodynamically stable currently. 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 Will follow cx's results  - Continue IVF's, antipyretics and supportive care -given positive high grade fever, will switch antibiotics to zosyn (in order to have better gi microorganisms coverage)  - Blood cultures x 2 NGTD - Lactate wnl. - Monitor CBC w/diff: WBC stable now  Biliary obstruction/hyperbilirubinemia:  -T Bili 4.8 on admission, slowly rising as outpatient with evidence of GB inflammation on U/S.  -CT showed distended gallbladder without inflammation. No evidence of hemolysis. EGD 12/24 showed biliary stent occlusion.  - Discussed with IR: who has performed percutaneous transhepatic cholangiogram/drain placement in IR 12/26 -will follow post-procedure rec's -bilirubin and LFT's trending down appropriately   Cancer of head of pancreas, metastasized to liver and duodenum: Patient has been followed up by Dr. [name].  Patient is on chemotherapy, last dose was 3 weeks ago, recently held for hyperbilirubinemia. CA-19-9 was 283 on 10/10/16 -continue f/u with Dr. [name] as outpatient  Hypokalemia: K= 3.4, replete and recheck. Due to poor po from frequent NPO status for procedures  DM-II: Last A1c 7.0 on 09/19/15, well controled. Patient is taking Levemir at home -will continue adjusted lantus dose while inpatient  -SSI -follow CBG  Malnutrition of moderate degree: -Nutrition consult requested  -will follow rec's for feeding supplements   GERD: -continue Protonix  Itching and constipation  -will use miralax daily for now; holding it for diarrhea  -also on adjusted dose of atarax for itching. Anticipate improvement in this symptom once bilirubin level normalizes   DVT prophylaxis: SCDs Code Status: Partial code, DNI, otherwise all resuscitative measures.  Family Communication: Wife at bedside Disposition  Plan: Continued treatment of sepsis, pending further  IR procedure/rec's; will adjust bowel regimen medication and adjust dose of  atarax   Consultants:   Gastroenterology, Dr. [name]  Oncology   Interventional radiology  Procedures:   EGD 12/24  Antimicrobials:  Cefepime 12/22 >> 12/28  Zosyn 12/28  Subjective: Pt feels ok. Complaining of itching and endorses positive BM overnight. Had fever (high grade temp).  Objective: Vitals:   10/17/16 2254 10/18/16 0147 10/18/16 0548 10/18/16 1427  BP:   128/71 126/78  Pulse:   62 68  Resp:   18 16  Temp: (!) 100.7 F (38.2 C) 98.2 F (36.8 C) 98.4 F (36.9 C) 97.9 F (36.6 C)  TempSrc: Oral Oral Oral Oral  SpO2:   98% 98%  Weight:      Height:        Intake/Output Summary (Last 24 hours) at 10/18/16 1556 Last data filed at 10/18/16 1427  Gross per 24 hour  Intake              920 ml  Output              425 ml  Net              495 ml   Filed Weights   10/12/16 2300  Weight: 73.6 kg (162 lb 4.1 oz)    Examination: General exam: Jaundiced 70 y.o. male in no distress, denies abd pain, nausea and vomiting. Patient spiked fever overnight (high grade temp) and is complaining of itching. Positive BM reported overnight as well. Respiratory system: Non-labored breathing room air. Clear to auscultation bilaterally.  Cardiovascular system: Regular rate and rhythm. No murmur, rub, or gallop. No JVD, and no pedal edema. Gastrointestinal system: Abdomen soft, absolutely not tender, non-distended, with normoactive bowel sounds. No organomegaly or masses felt. Central nervous system: Alert and oriented. No focal neurological deficits. Extremities: Warm, no deformities Skin: No rashes. Right upper quadrant with percutaneous biliary drain; good drainage, no erythema.  Psychiatry: Judgement and insight appear normal. Mood & affect appropriate.   Data Reviewed: I have personally reviewed following labs and imaging  studies  CBC:  Recent Labs Lab 10/12/16 1612  10/14/16 0458 10/15/16 0433 10/16/16 0409 10/17/16 0520 10/18/16 0335  WBC 3.2*  < > 4.1 3.2* 4.2 6.6 6.3  NEUTROABS 2.8  --   --   --  2.6 4.8 4.4  HGB 10.1*  < > 10.0* 9.9* 10.0* 10.5* 9.2*  HCT 29.9*  < > 29.5* 28.5* 28.3* 30.5* 27.0*  MCV 92.6  < > 90.5 90.2 89.0 90.5 90.9  PLT 128*  < > 110* 114* 126* 170 147*  < > = values in this interval not displayed. Basic Metabolic Panel:  Recent Labs Lab 10/14/16 0458 10/15/16 0433 10/16/16 0409 10/17/16 0520 10/18/16 0335  NA 135 139 137 135 139  K 3.3* 3.4* 3.4* 3.6 3.6  CL 103 107 104 99* 102  CO2 26 26 26 27 30   GLUCOSE 226* 195* 98 212* 125*  BUN 11 11 12 17 13   CREATININE 0.73 0.56* 0.64 0.69 0.62  CALCIUM 8.4* 8.7* 8.5* 8.6* 8.6*  MG  --   --   --  2.0  --    GFR: Estimated Creatinine Clearance: 92 mL/min (by C-G formula based on SCr of 0.62 mg/dL).   Liver Function Tests:  Recent Labs Lab 10/14/16 0458 10/15/16 0433 10/16/16 0409 10/17/16 0520 10/18/16 0335  AST 118* 154* 189* 122* 55*  ALT 138* 147* 166* 150* 103*  ALKPHOS 699* 753* 871* 828* 622*  BILITOT 5.1* 5.5* 5.0* 5.8* 4.9*  PROT  6.0* 5.9* 6.2* 6.0* 5.5*  ALBUMIN 2.8* 2.8* 2.8* 2.7* 2.4*    Recent Labs Lab 10/12/16 1612  LIPASE <10*  AMYLASE 42    Recent Labs Lab 10/12/16 1612  AMMONIA 43*   Coagulation Profile:  Recent Labs Lab 10/12/16 2021  INR 0.98   CBG:  Recent Labs Lab 10/17/16 1256 10/17/16 1639 10/17/16 2125 10/18/16 0800 10/18/16 1202  GLUCAP 338* 273* 216* 114* 393*   Urine analysis:    Component Value Date/Time   COLORURINE AMBER (A) 10/12/2016 1540   APPEARANCEUR CLEAR 10/12/2016 1540   LABSPEC 1.035 (H) 10/12/2016 1540   PHURINE 5.0 10/12/2016 1540   GLUCOSEU >=500 (A) 10/12/2016 1540   HGBUR SMALL (A) 10/12/2016 1540   BILIRUBINUR MODERATE (A) 10/12/2016 1540   KETONESUR NEGATIVE 10/12/2016 1540   PROTEINUR 30 (A) 10/12/2016 1540   NITRITE  NEGATIVE 10/12/2016 1540   LEUKOCYTESUR NEGATIVE 10/12/2016 1540   Sepsis Labs: @LABRCNTIP (procalcitonin:4,lacticidven:4)  ) Recent Results (from the past 240 hour(s))  Culture, blood (x 2)     Status: None   Collection Time: 10/12/16  4:12 PM  Result Value Ref Range Status   Specimen Description PORTA CATH RIGHT  Final   Special Requests BOTTLES DRAWN AEROBIC AND ANAEROBIC 5CC  Final   Culture   Final    NO GROWTH 5 DAYS Performed at [facility]    Report Status 10/17/2016 FINAL  Final  Respiratory Panel by PCR     Status: None   Collection Time: 10/12/16  7:00 PM  Result Value Ref Range Status   Adenovirus NOT DETECTED NOT DETECTED Final   Coronavirus 229E NOT DETECTED NOT DETECTED Final   Coronavirus HKU1 NOT DETECTED NOT DETECTED Final   Coronavirus NL63 NOT DETECTED NOT DETECTED Final   Coronavirus OC43 NOT DETECTED NOT DETECTED Final   Metapneumovirus NOT DETECTED NOT DETECTED Final   Rhinovirus / Enterovirus NOT DETECTED NOT DETECTED Final   Influenza A NOT DETECTED NOT DETECTED Final   Influenza B NOT DETECTED NOT DETECTED Final   Parainfluenza Virus 1 NOT DETECTED NOT DETECTED Final   Parainfluenza Virus 2 NOT DETECTED NOT DETECTED Final   Parainfluenza Virus 3 NOT DETECTED NOT DETECTED Final   Parainfluenza Virus 4 NOT DETECTED NOT DETECTED Final   Respiratory Syncytial Virus NOT DETECTED NOT DETECTED Final   Bordetella pertussis NOT DETECTED NOT DETECTED Final   Chlamydophila pneumoniae NOT DETECTED NOT DETECTED Final   Mycoplasma pneumoniae NOT DETECTED NOT DETECTED Final    Comment: Performed at [facility]  Culture, blood (x 2)     Status: None   Collection Time: 10/12/16  8:17 PM  Result Value Ref Range Status   Specimen Description RIGHT ANTECUBITAL  Final   Special Requests BOTTLES DRAWN AEROBIC AND ANAEROBIC 5CC  Final   Culture   Final    NO GROWTH 5 DAYS Performed at [facility]    Report Status 10/17/2016 FINAL  Final      Radiology Studies: No results found.  Scheduled Meds: . insulin aspart  0-9 Units Subcutaneous TID WC  . insulin detemir  30 Units Subcutaneous Daily  . lipase/protease/amylase  36,000 Units Oral TID AC  . loratadine  10 mg Oral Daily  . multivitamin with minerals  1 tablet Oral Daily  . pantoprazole  40 mg Oral Daily  . piperacillin-tazobactam (ZOSYN)  IV  3.375 g Intravenous Q8H  . potassium chloride  30 mEq Oral Once  . sodium chloride flush  3  mL Intravenous Q12H   Continuous Infusions:    LOS: 6 days   Time spent: 25 minutes.  Barton Dubois, MD [facility] Pager [phone]  If 7PM-7AM, please contact night-coverage [email or web address] Password [facility] 10/18/2016, 3:56 PM

## 2016-10-18 NOTE — Progress Notes (Signed)
Subjective: He is well.  Improved with the biliary stent.  Objective: Vital signs in last 24 hours: Temp:  [97.9 F (36.6 C)-101.3 F (38.5 C)] 97.9 F (36.6 C) (12/28 1427) Pulse Rate:  [62-78] 68 (12/28 1427) Resp:  [16-18] 16 (12/28 1427) BP: (126-165)/(71-78) 126/78 (12/28 1427) SpO2:  [97 %-98 %] 98 % (12/28 1427) Last BM Date: 10/18/16  Intake/Output from previous day: 12/27 0701 - 12/28 0700 In: 340 [P.O.:240; IV Piggyback:100] Out: 725 [Drains:725] Intake/Output this shift: Total I/O In: 580 [P.O.:480; IV Piggyback:100] Out: 240 [Drains:240]  General appearance: alert and no distress GI: soft, non-tender; bowel sounds normal; no masses,  no organomegaly  Lab Results:  Recent Labs  10/16/16 0409 10/17/16 0520 10/18/16 0335  WBC 4.2 6.6 6.3  HGB 10.0* 10.5* 9.2*  HCT 28.3* 30.5* 27.0*  PLT 126* 170 147*   BMET  Recent Labs  10/16/16 0409 10/17/16 0520 10/18/16 0335  NA 137 135 139  K 3.4* 3.6 3.6  CL 104 99* 102  CO2 '26 27 30  '$ GLUCOSE 98 212* 125*  BUN '12 17 13  '$ CREATININE 0.64 0.69 0.62  CALCIUM 8.5* 8.6* 8.6*   LFT  Recent Labs  10/18/16 0335  PROT 5.5*  ALBUMIN 2.4*  AST 55*  ALT 103*  ALKPHOS 622*  BILITOT 4.9*   PT/INR No results for input(s): LABPROT, INR in the last 72 hours. Hepatitis Panel No results for input(s): HEPBSAG, HCVAB, HEPAIGM, HEPBIGM in the last 72 hours. C-Diff No results for input(s): CDIFFTOX in the last 72 hours. Fecal Lactopherrin No results for input(s): FECLLACTOFRN in the last 72 hours.  Studies/Results: No results found.  Medications:  Scheduled: . insulin aspart  0-9 Units Subcutaneous TID WC  . insulin detemir  30 Units Subcutaneous Daily  . lipase/protease/amylase  36,000 Units Oral TID AC  . loratadine  10 mg Oral Daily  . multivitamin with minerals  1 tablet Oral Daily  . pantoprazole  40 mg Oral Daily  . piperacillin-tazobactam (ZOSYN)  IV  3.375 g Intravenous Q8H  . potassium chloride   30 mEq Oral Once  . sodium chloride flush  3 mL Intravenous Q12H   Continuous:   Assessment/Plan: 1) Occluded biliary stent. 2) Pancreatic cancer.   He is well with PTC.  I will discuss with IR about the possibility of replacing the metallic stent endoscopically now that he has drainage.  I do not know if it is feasible.  Plan: 1) Continue with the current care.  LOS: 6 days   Bailley Guilford D 10/18/2016, 6:38 PM

## 2016-10-18 NOTE — Progress Notes (Signed)
Patient ID: Jacob Hardin, male   DOB: 06-19-1946, 70 y.o.   MRN: [id number]    Referring Physician(s): Dr. Vance Gather  Supervising Physician: Jacqulynn Cadet  Patient Status: [facility] - In-pt  Chief Complaint: Obstructive jaundice  Subjective: Patient feels much better today.  Nausea has resolved.  He ate all of his meals yesterday.  Abdominal pain is much improved as well.  Allergies: Codeine  Medications: Prior to Admission medications   Medication Sig Start Date End Date Taking? Authorizing Provider  baclofen (LIORESAL) 10 MG tablet Take 5 mg by mouth 3 (three) times daily as needed (for hiccups).   Yes Historical Provider, MD  esomeprazole (NEXIUM) 40 MG capsule Take 40 mg by mouth daily.   Yes Historical Provider, MD  insulin aspart (NOVOLOG FLEXPEN) 100 UNIT/ML FlexPen Inject 4 Units into the skin daily before supper.   Yes Historical Provider, MD  Insulin Detemir (LEVEMIR FLEXTOUCH) 100 UNIT/ML Pen Inject 40 Units into the skin daily.   Yes Historical Provider, MD  lidocaine-prilocaine (EMLA) cream Apply 1 application topically as needed (prior to accessing port).   Yes Historical Provider, MD  lipase/protease/amylase (CREON) 36000 UNITS CPEP capsule Take 1 capsule (36,000 Units total) by mouth 3 (three) times daily before meals. 08/22/16  Yes Ladell Pier, MD  loperamide (IMODIUM) 2 MG capsule Take 2 mg by mouth as needed for diarrhea or loose stools.   Yes Historical Provider, MD  loratadine (CLARITIN) 10 MG tablet Take 10 mg by mouth daily.    Yes Historical Provider, MD  Multiple Vitamin (MULTIVITAMIN WITH MINERALS) TABS tablet Take 1 tablet by mouth daily.   Yes Historical Provider, MD  ondansetron (ZOFRAN) 8 MG tablet Take 8 mg by mouth every 6 (six) hours as needed for nausea or vomiting.    Yes Historical Provider, MD  polyethylene glycol (MIRALAX / GLYCOLAX) packet Take 17 g by mouth daily as needed for mild constipation.    Yes Historical Provider, MD    prochlorperazine (COMPAZINE) 10 MG tablet Take 1 tablet (10 mg total) by mouth every 6 (six) hours as needed for nausea or vomiting. 12/02/15  Yes Ladell Pier, MD    Vital Signs: BP 128/71 (BP Location: Right Arm)   Pulse 62   Temp 98.4 F (36.9 C) (Oral)   Resp 18   Ht 6' (1.829 m)   Wt 162 lb 4.1 oz (73.6 kg)   SpO2 98%   BMI 22.01 kg/m   Physical Exam: Abd: soft, drain is draining well.  Drain site is c/d/i.  Drain with bilious output. 725cc/24hrs  Imaging: Ir Biliary Drain Placement With Cholangiogram  Result Date: 10/16/2016 INDICATION: Biliary obstruction.  Pancreatic cancer. EXAM: IR BILIARY DRAIN PLACEMENT W/ CHOLANGIOGRAM MEDICATIONS: Patient is on antibiotics; The antibiotic was administered within an appropriate time frame prior to the initiation of the procedure. ANESTHESIA/SEDATION: Moderate (conscious) sedation was employed during this procedure. A total of Versed 4 mg and Fentanyl 250 mcg was administered intravenously. Moderate Sedation Time: 49 minutes. The patient's level of consciousness and vital signs were monitored continuously by radiology nursing throughout the procedure under my direct supervision. FLUOROSCOPY TIME:  Fluoroscopy Time: 18 minutes 48 seconds (556 mGy). COMPLICATIONS: None immediate. PROCEDURE: Informed written consent was obtained from the patient after a thorough discussion of the procedural risks, benefits and alternatives. All questions were addressed. Maximal Sterile Barrier Technique was utilized including caps, mask, sterile gowns, sterile gloves, sterile drape, hand hygiene and skin antiseptic. A timeout was performed prior  to the initiation of the procedure. The right flank was prepped and draped in a sterile fashion. 1% lidocaine was utilized for local anesthesia. Under sonographic guidance, a Chiba needle was advanced into the common hepatic duct. Contrast was injected opacifying the biliary system. A 21 gauge needle was then utilized to  puncture a peripheral right hepatic duct under fluoroscopic guidance. It was removed over a 018 wire which was up sized to a 3 J. A 5 French Kumpe catheter was advanced over the 3 J. The catheter was then advanced over a glidewire into the duodenum across the common hepatic duct obstruction. The Kumpe be catheter was then removed over an Amplatz wire. A 10 [place] biliary drain was then advanced over the Amplatz wire. The tip was coiled in the duodenum. It was string fixed. Contrast was injected. FINDINGS: The initial cholangiogram confirms common hepatic duct obstruction above the existing stent. Subsequent images demonstrate access into the biliary system via a peripheral right hepatic duct. The final image demonstrates a right internal external biliary drain with its tip coiled in the duodenum. IMPRESSION: Initial cholangiography confirms biliary obstruction in the common hepatic duct above the extent of the stent. A right internal external biliary drain was successful with its tip coiled in the duodenum. The patient will return for follow-up cholangiogram and possible stent placement. Electronically Signed   By: Marybelle Killings M.D.   On: 10/16/2016 16:52    Labs:  CBC:  Recent Labs  10/15/16 0433 10/16/16 0409 10/17/16 0520 10/18/16 0335  WBC 3.2* 4.2 6.6 6.3  HGB 9.9* 10.0* 10.5* 9.2*  HCT 28.5* 28.3* 30.5* 27.0*  PLT 114* 126* 170 147*    COAGS:  Recent Labs  11/25/15 0753 11/30/15 1130 12/13/15 0750 10/12/16 2021  INR 1.07 1.10 1.08 0.98  APTT '27 28 26 30    '$ BMP:  Recent Labs  10/15/16 0433 10/16/16 0409 10/17/16 0520 10/18/16 0335  NA 139 137 135 139  K 3.4* 3.4* 3.6 3.6  CL 107 104 99* 102  CO2 '26 26 27 30  '$ GLUCOSE 195* 98 212* 125*  BUN '11 12 17 13  '$ CALCIUM 8.7* 8.5* 8.6* 8.6*  CREATININE 0.56* 0.64 0.69 0.62  GFRNONAA >60 >60 >60 >60  GFRAA >60 >60 >60 >60    LIVER FUNCTION TESTS:  Recent Labs  10/15/16 0433 10/16/16 0409 10/17/16 0520 10/18/16 0335    BILITOT 5.5* 5.0* 5.8* 4.9*  AST 154* 189* 122* 55*  ALT 147* 166* 150* 103*  ALKPHOS 753* 871* 828* 622*  PROT 5.9* 6.2* 6.0* 5.5*  ALBUMIN 2.8* 2.8* 2.7* 2.4*    Assessment and Plan: 1. Pancreatic cancer with obstructive jaundice, s/p PTC drain with I/E drain placement on 12/26 -LFTs are improving today.  We will continue to follow these labs.  If they are improved still tomorrow, we will plan to cap this drain. -cont to follow for now.  Electronically Signed: Henreitta Cea 10/18/2016, 10:56 AM   I spent a total of 15 Minutes at the the patient's bedside AND on the patient's hospital floor or unit, greater than 50% of which was counseling/coordinating care for obstructive jaundice

## 2016-10-18 NOTE — Progress Notes (Signed)
Inpatient Diabetes Program Recommendations  AACE/ADA: New Consensus Statement on Inpatient Glycemic Control (2015)  Target Ranges:  Prepandial:   less than 140 mg/dL      Peak postprandial:   less than 180 mg/dL (1-2 hours)      Critically ill patients:  140 - 180 mg/dL   Results for Jacob Hardin, Jacob Hardin (MRN [id number]) as of 10/18/2016 13:52  Ref. Range 10/17/2016 07:51 10/17/2016 12:56 10/17/2016 16:39 10/17/2016 21:25  Glucose-Capillary Latest Ref Range: 65 - 99 mg/dL 167 (H) 338 (H) 273 (H) 216 (H)   Results for Jacob Hardin, Jacob Hardin (MRN [id number]) as of 10/18/2016 13:52  Ref. Range 10/18/2016 08:00 10/18/2016 12:02  Glucose-Capillary Latest Ref Range: 65 - 99 mg/dL 114 (H) 393 (H)    Home DM Meds: Levemir 40 units daily       Novolog 4 units Qsupper  Current Insulin Orders: Levemir 30 units daily      Novolog Sensitive Correction Scale/ SSI (0-9 units) TID AC       MD- Please consider the following in-hospital insulin adjustments:  1. Start Novolog Meal Coverage: Novolog 4 units TID with meals (hold if pt eats <50% of meal)  2. Change diet to Carbohydrate Modified diet (currently ordered as Regular diet)     --Will follow patient during hospitalization--  Wyn Quaker RN, MSN, CDE Diabetes Coordinator Inpatient Glycemic Control Team Team Pager: [phone] (8a-5p)

## 2016-10-18 NOTE — Progress Notes (Signed)
Oncology Nurse Navigator Documentation  Oncology Nurse Navigator Flowsheets 10/18/2016  Navigator Location [facility]  Referral date to RadOnc/MedOnc -  Navigator Encounter Type Other--hospital visit  Abnormal Finding Date -  Confirmed Diagnosis Date -  Treatment Initiated Date -  Patient Visit Type Inpatient  Treatment Phase Active Tx--FOLFIRINOX (last tx 09/19/16)  Barriers/Navigation Needs No barriers at this time;No Questions;No Needs  Education -  Interventions Other--requested wife to call office when he is discharged to determine if MD needs to see him sooner than 10/31/16.  Education Method -  Support Groups/Services -  Acuity Level 1  Time Spent with Patient 15

## 2016-10-19 ENCOUNTER — Other Ambulatory Visit: Payer: Self-pay | Admitting: General Surgery

## 2016-10-19 LAB — COMPREHENSIVE METABOLIC PANEL
ALBUMIN: 2.7 g/dL — AB (ref 3.5–5.0)
ALK PHOS: 545 U/L — AB (ref 38–126)
ALT: 85 U/L — AB (ref 17–63)
AST: 49 U/L — AB (ref 15–41)
Anion gap: 7 (ref 5–15)
BILIRUBIN TOTAL: 4.9 mg/dL — AB (ref 0.3–1.2)
BUN: 13 mg/dL (ref 6–20)
CALCIUM: 8.5 mg/dL — AB (ref 8.9–10.3)
CO2: 29 mmol/L (ref 22–32)
CREATININE: 0.77 mg/dL (ref 0.61–1.24)
Chloride: 101 mmol/L (ref 101–111)
GFR calc Af Amer: >60 mL/min (ref >60)
GFR calc non Af Amer: >60 mL/min (ref >60)
GLUCOSE: 163 mg/dL — AB (ref 65–99)
Potassium: 3.5 mmol/L (ref 3.5–5.1)
Sodium: 137 mmol/L (ref 135–145)
TOTAL PROTEIN: 6 g/dL — AB (ref 6.5–8.1)

## 2016-10-19 LAB — GLUCOSE, CAPILLARY
GLUCOSE-CAPILLARY: 149 mg/dL — AB (ref 65–99)
GLUCOSE-CAPILLARY: 360 mg/dL — AB (ref 65–99)
GLUCOSE-CAPILLARY: 260 mg/dL — AB (ref 65–99)
Glucose-Capillary: 187 mg/dL — ABNORMAL HIGH (ref 65–99)

## 2016-10-19 MED ORDER — POLYETHYLENE GLYCOL 3350 17 G PO PACK
17.0000 g | PACK | Freq: Every day | ORAL | Status: DC
  Filled 2016-10-19: qty 1

## 2016-10-19 NOTE — Progress Notes (Signed)
PROGRESS NOTE  Jacob Hardin  [id number] DOB: 22-Jun-1946 DOA: 10/12/2016 PCP: [name], MD  Outpatient Specialists: Dr [name], gastroenterology  Brief Narrative: Jacob Hardin is a 70 y.o. 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 [name], (had placement of a percutaneous internal/external biliary drain 11/25/2015; biliary drainage catheter capped 12/02/2015; biliary drain internalized 12/13/2015), HTN, HLD, DM, and GERD, who presented 12/22 with chills. He had no other significant symptoms, but had similar presentation in February of this year that developed into sepsis. On arrival, work up was significant for abdominal U/S showing sludge with probable nonshadowing gallstones in thickened edematous gallbladder as well as dilated biliary ducts with air. WBC 3.2, lactate 1.37, negative urinalysis, potassium 3.4, creatinine 1.04, abnormal liver function with total bilirubin 5.8, ALP 871, AST 175 and ALT 173. temperature 100.2, tachycardia, oxygen saturation 97% on room air. Oncology and GI were consulted and he was admitted for treatment of sepsis due to presumed acute cholecystitis. Subsequent CT showed distended GB without evidence of biliary obstruction. EGD 12/24 showed obstructing debris in biliary stent. IR consulted and planning percutaneous transhepatic cholangiogram/drain placement in IR 12/26.  Assessment & Plan: Principal Problem:   Sepsis ([place]) Active Problems:   Diabetes mellitus without complication ([place])   Cancer of head of pancreas ([place])   Metastatic cancer ([place])   Immunocompromised patient ([place])   Malnutrition of moderate degree   GERD (gastroesophageal reflux disease)   Hyperbilirubinemia   Hypokalemia  Sepsis possibly due to cholestasis: Patient has abnormal liver function with increased total bilirubin plus abdominal ultrasound  findings, consistent with biliary system infection. Sepsis features resolving.  -Hemodynamically stable currently. 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 Will follow final cx's results  - Continue IVF's, antipyretics and supportive care -now afebrile for 24 hours after use of zosyn; will monitor for another 24 hours. Planning to discharge on Augmentin on 12/30 if he remains stable.  - Blood cultures x 2 NGTD - Lactate wnl. - Monitor CBC w/diff: WBC stable now  Biliary obstruction/hyperbilirubinemia:  -T Bili 5.8 on admission, slowly rising as outpatient with evidence of GB inflammation on U/S.  -CT showed distended gallbladder without inflammation. No evidence of hemolysis. EGD 12/24 showed biliary stent occlusion.  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 [name].  Patient is on chemotherapy, last dose was 3 weeks ago, recently held for hyperbilirubinemia. CA-19-9 was 283 on 10/10/16 -continue f/u with Dr. [name] as outpatient  Hypokalemia: K= 3.4, replete and recheck. Due to poor po from frequent NPO status for procedures  DM-II: Last A1c 7.0 on 09/19/15, well controled. Patient is taking Levemir at home -will continue adjusted lantus dose while inpatient  -SSI -follow CBG  Malnutrition of moderate degree: -Nutrition consult requested  -will follow rec's for feeding supplements   GERD: -continue Protonix  Itching and constipation  -will continue miralax daily for now; holding parameters if he develops diarrhea  -continue also adjusted dose of atarax for itching. Anticipate improvement in this symptom once bilirubin level normalizes   DVT prophylaxis: SCDs Code Status: Partial  code, DNI, otherwise all resuscitative measures.  Family Communication: Wife at bedside Disposition Plan: Continued  treatment of sepsis, pending further  IR procedure/rec's; will continue current bowel regimen and adjusted dose of atarax   Consultants:   Gastroenterology, Dr. [name]  Oncology   Interventional radiology  Procedures:   EGD 12/24  Antimicrobials:  Cefepime 12/22 >> 12/28  Zosyn 12/28  Subjective: Pt feels ok. Reports no nausea, no vomiting, improvement in his itching and endorses positive BM overnight. Afebrile over 24 hours now.  Objective: Vitals:   10/18/16 1427 10/18/16 2122 10/19/16 0451 10/19/16 1536  BP: 126/78 (!) 147/76 (!) 161/82 138/67  Pulse: 68 72 62 67  Resp: 16 16 16 16   Temp: 97.9 F (36.6 C) 98.2 F (36.8 C) 98 F (36.7 C) 98 F (36.7 C)  TempSrc: Oral Oral Oral Oral  SpO2: 98% 99% 99% 99%  Weight:      Height:        Intake/Output Summary (Last 24 hours) at 10/19/16 1617 Last data filed at 10/19/16 1200  Gross per 24 hour  Intake              990 ml  Output              715 ml  Net              275 ml   Filed Weights   10/12/16 2300  Weight: 73.6 kg (162 lb 4.1 oz)    Examination: General exam: Jaundiced 70 y.o. male in no distress, denies abd pain, nausea and vomiting. Patient now afebrile for > 24 hours. Reports improvement on itching. Positive BM reported overnight as well and endorses no abd pain. Respiratory system: Non-labored breathing room air. Clear to auscultation bilaterally.  Cardiovascular system: Regular rate and rhythm. No murmur, rub, or gallop. No JVD, and no pedal edema. Gastrointestinal system: Abdomen soft, absolutely not tender, non-distended, with normoactive bowel sounds. No organomegaly or masses felt. Central nervous system: Alert and oriented. No focal neurological deficits. Extremities: Warm, no deformities Skin: No rashes. Right upper quadrant with percutaneous biliary drain; good drainage, no  erythema.  Psychiatry: Judgement and insight appear normal. Mood & affect appropriate.   Data Reviewed: I have personally reviewed following labs and imaging studies  CBC:  Recent Labs Lab 10/14/16 0458 10/15/16 0433 10/16/16 0409 10/17/16 0520 10/18/16 0335  WBC 4.1 3.2* 4.2 6.6 6.3  NEUTROABS  --   --  2.6 4.8 4.4  HGB 10.0* 9.9* 10.0* 10.5* 9.2*  HCT 29.5* 28.5* 28.3* 30.5* 27.0*  MCV 90.5 90.2 89.0 90.5 90.9  PLT 110* 114* 126* 170 [id number]*   Basic Metabolic Panel:  Recent Labs Lab 10/15/16 0433 10/16/16 0409 10/17/16 0520 10/18/16 0335 10/19/16 0400  NA 139 137 135 139 137  K 3.4* 3.4* 3.6 3.6 3.5  CL 107 104 99* 102 101  CO2 26 26 27 30 29   GLUCOSE 195* 98 212* 125* 163*  BUN 11 12 17 13 13   CREATININE 0.56* 0.64 0.69 0.62 0.77  CALCIUM 8.7* 8.5* 8.6* 8.6* 8.5*  MG  --   --  2.0  --   --    GFR: Estimated Creatinine Clearance: 92 mL/min (by C-G formula based on SCr of 0.77 mg/dL).   Liver Function Tests:  Recent Labs Lab 10/15/16 0433 10/16/16 0409 10/17/16 0520 10/18/16 0335 10/19/16 0400  AST 154* 189* 122* 55* 49*  ALT 147* 166* 150* 103* 85*  ALKPHOS 753* 871* 828* 622* 545*  BILITOT 5.5* 5.0* 5.8* 4.9* 4.9*  PROT 5.9* 6.2* 6.0* 5.5* 6.0*  ALBUMIN  2.8* 2.8* 2.7* 2.4* 2.7*   Coagulation Profile:  Recent Labs Lab 10/12/16 2021  INR 0.98   CBG:  Recent Labs Lab 10/18/16 1202 10/18/16 1705 10/18/16 2127 10/19/16 0742 10/19/16 1211  GLUCAP 393* 283* 116* 149* 360*   Urine analysis:    Component Value Date/Time   COLORURINE AMBER (A) 10/12/2016 1540   APPEARANCEUR CLEAR 10/12/2016 1540   LABSPEC 1.035 (H) 10/12/2016 1540   PHURINE 5.0 10/12/2016 1540   GLUCOSEU >=500 (A) 10/12/2016 1540   HGBUR SMALL (A) 10/12/2016 1540   BILIRUBINUR MODERATE (A) 10/12/2016 1540   KETONESUR NEGATIVE 10/12/2016 1540   PROTEINUR 30 (A) 10/12/2016 1540   NITRITE NEGATIVE 10/12/2016 1540   LEUKOCYTESUR NEGATIVE 10/12/2016 1540   Sepsis  Labs: @LABRCNTIP (procalcitonin:4,lacticidven:4)  ) Recent Results (from the past 240 hour(s))  Culture, blood (x 2)     Status: None   Collection Time: 10/12/16  4:12 PM  Result Value Ref Range Status   Specimen Description PORTA CATH RIGHT  Final   Special Requests BOTTLES DRAWN AEROBIC AND ANAEROBIC 5CC  Final   Culture   Final    NO GROWTH 5 DAYS Performed at [facility]    Report Status 10/17/2016 FINAL  Final  Respiratory Panel by PCR     Status: None   Collection Time: 10/12/16  7:00 PM  Result Value Ref Range Status   Adenovirus NOT DETECTED NOT DETECTED Final   Coronavirus 229E NOT DETECTED NOT DETECTED Final   Coronavirus HKU1 NOT DETECTED NOT DETECTED Final   Coronavirus NL63 NOT DETECTED NOT DETECTED Final   Coronavirus OC43 NOT DETECTED NOT DETECTED Final   Metapneumovirus NOT DETECTED NOT DETECTED Final   Rhinovirus / Enterovirus NOT DETECTED NOT DETECTED Final   Influenza A NOT DETECTED NOT DETECTED Final   Influenza B NOT DETECTED NOT DETECTED Final   Parainfluenza Virus 1 NOT DETECTED NOT DETECTED Final   Parainfluenza Virus 2 NOT DETECTED NOT DETECTED Final   Parainfluenza Virus 3 NOT DETECTED NOT DETECTED Final   Parainfluenza Virus 4 NOT DETECTED NOT DETECTED Final   Respiratory Syncytial Virus NOT DETECTED NOT DETECTED Final   Bordetella pertussis NOT DETECTED NOT DETECTED Final   Chlamydophila pneumoniae NOT DETECTED NOT DETECTED Final   Mycoplasma pneumoniae NOT DETECTED NOT DETECTED Final    Comment: Performed at [facility]  Culture, blood (x 2)     Status: None   Collection Time: 10/12/16  8:17 PM  Result Value Ref Range Status   Specimen Description RIGHT ANTECUBITAL  Final   Special Requests BOTTLES DRAWN AEROBIC AND ANAEROBIC 5CC  Final   Culture   Final    NO GROWTH 5 DAYS Performed at [facility]    Report Status 10/17/2016 FINAL  Final     Radiology Studies: No results found.  Scheduled Meds: . insulin aspart   0-9 Units Subcutaneous TID WC  . insulin detemir  30 Units Subcutaneous Daily  . lipase/protease/amylase  36,000 Units Oral TID AC  . loratadine  10 mg Oral Daily  . multivitamin with minerals  1 tablet Oral Daily  . pantoprazole  40 mg Oral Daily  . piperacillin-tazobactam (ZOSYN)  IV  3.375 g Intravenous Q8H  . polyethylene glycol  17 g Oral Daily  . potassium chloride  30 mEq Oral Once  . sodium chloride flush  3 mL Intravenous Q12H   Continuous Infusions:    LOS: 7 days   Time spent: 25 minutes.  Barton Dubois, MD  [facility] Pager [phone]  If 7PM-7AM, please contact night-coverage [email or web address] Password [facility] 10/19/2016, 4:17 PM

## 2016-10-19 NOTE — Progress Notes (Signed)
Patient ID: Jacob Hardin, male   DOB: 1945/11/11, 70 y.o.   MRN: [id number]    Referring Physician(s): Dr. Vance Gather  Supervising Physician: Arne Cleveland  Patient Status: [facility] - In-pt  Chief Complaint: Obstructive jaundice  Subjective: Patient continues to feel better.  No fevers in over 24hrs.  Allergies: Codeine  Medications: Prior to Admission medications   Medication Sig Start Date End Date Taking? Authorizing Provider  baclofen (LIORESAL) 10 MG tablet Take 5 mg by mouth 3 (three) times daily as needed (for hiccups).   Yes Historical Provider, MD  esomeprazole (NEXIUM) 40 MG capsule Take 40 mg by mouth daily.   Yes Historical Provider, MD  insulin aspart (NOVOLOG FLEXPEN) 100 UNIT/ML FlexPen Inject 4 Units into the skin daily before supper.   Yes Historical Provider, MD  Insulin Detemir (LEVEMIR FLEXTOUCH) 100 UNIT/ML Pen Inject 40 Units into the skin daily.   Yes Historical Provider, MD  lidocaine-prilocaine (EMLA) cream Apply 1 application topically as needed (prior to accessing port).   Yes Historical Provider, MD  lipase/protease/amylase (CREON) 36000 UNITS CPEP capsule Take 1 capsule (36,000 Units total) by mouth 3 (three) times daily before meals. 08/22/16  Yes Ladell Pier, MD  loperamide (IMODIUM) 2 MG capsule Take 2 mg by mouth as needed for diarrhea or loose stools.   Yes Historical Provider, MD  loratadine (CLARITIN) 10 MG tablet Take 10 mg by mouth daily.    Yes Historical Provider, MD  Multiple Vitamin (MULTIVITAMIN WITH MINERALS) TABS tablet Take 1 tablet by mouth daily.   Yes Historical Provider, MD  ondansetron (ZOFRAN) 8 MG tablet Take 8 mg by mouth every 6 (six) hours as needed for nausea or vomiting.    Yes Historical Provider, MD  polyethylene glycol (MIRALAX / GLYCOLAX) packet Take 17 g by mouth daily as needed for mild constipation.    Yes Historical Provider, MD  prochlorperazine (COMPAZINE) 10 MG tablet Take 1 tablet (10 mg total) by mouth every 6  (six) hours as needed for nausea or vomiting. 12/02/15  Yes Ladell Pier, MD    Vital Signs: BP (!) 161/82 (BP Location: Left Arm)   Pulse 62   Temp 98 F (36.7 C) (Oral)   Resp 16   Ht 6' (1.829 m)   Wt 162 lb 4.1 oz (73.6 kg)   SpO2 99%   BMI 22.01 kg/m   Physical Exam: Abd: soft, NT, drain with some bilious drainage in bag.  Catheter disconnected and cap placed.  Drain site is c/d/i  Imaging: Ir Biliary Drain Placement With Cholangiogram  Result Date: 10/16/2016 INDICATION: Biliary obstruction.  Pancreatic cancer. EXAM: IR BILIARY DRAIN PLACEMENT W/ CHOLANGIOGRAM MEDICATIONS: Patient is on antibiotics; The antibiotic was administered within an appropriate time frame prior to the initiation of the procedure. ANESTHESIA/SEDATION: Moderate (conscious) sedation was employed during this procedure. A total of Versed 4 mg and Fentanyl 250 mcg was administered intravenously. Moderate Sedation Time: 49 minutes. The patient's level of consciousness and vital signs were monitored continuously by radiology nursing throughout the procedure under my direct supervision. FLUOROSCOPY TIME:  Fluoroscopy Time: 18 minutes 48 seconds (556 mGy). COMPLICATIONS: None immediate. PROCEDURE: Informed written consent was obtained from the patient after a thorough discussion of the procedural risks, benefits and alternatives. All questions were addressed. Maximal Sterile Barrier Technique was utilized including caps, mask, sterile gowns, sterile gloves, sterile drape, hand hygiene and skin antiseptic. A timeout was performed prior to the initiation of the procedure. The right flank was  prepped and draped in a sterile fashion. 1% lidocaine was utilized for local anesthesia. Under sonographic guidance, a Chiba needle was advanced into the common hepatic duct. Contrast was injected opacifying the biliary system. A 21 gauge needle was then utilized to puncture a peripheral right hepatic duct under fluoroscopic guidance.  It was removed over a 018 wire which was up sized to a 3 J. A 5 French Kumpe catheter was advanced over the 3 J. The catheter was then advanced over a glidewire into the duodenum across the common hepatic duct obstruction. The Kumpe be catheter was then removed over an Amplatz wire. A 10 [place] biliary drain was then advanced over the Amplatz wire. The tip was coiled in the duodenum. It was string fixed. Contrast was injected. FINDINGS: The initial cholangiogram confirms common hepatic duct obstruction above the existing stent. Subsequent images demonstrate access into the biliary system via a peripheral right hepatic duct. The final image demonstrates a right internal external biliary drain with its tip coiled in the duodenum. IMPRESSION: Initial cholangiography confirms biliary obstruction in the common hepatic duct above the extent of the stent. A right internal external biliary drain was successful with its tip coiled in the duodenum. The patient will return for follow-up cholangiogram and possible stent placement. Electronically Signed   By: Marybelle Killings M.D.   On: 10/16/2016 16:52    Labs:  CBC:  Recent Labs  10/15/16 0433 10/16/16 0409 10/17/16 0520 10/18/16 0335  WBC 3.2* 4.2 6.6 6.3  HGB 9.9* 10.0* 10.5* 9.2*  HCT 28.5* 28.3* 30.5* 27.0*  PLT 114* 126* 170 147*    COAGS:  Recent Labs  11/25/15 0753 11/30/15 1130 12/13/15 0750 10/12/16 2021  INR 1.07 1.10 1.08 0.98  APTT '27 28 26 30    '$ BMP:  Recent Labs  10/16/16 0409 10/17/16 0520 10/18/16 0335 10/19/16 0400  NA 137 135 139 137  K 3.4* 3.6 3.6 3.5  CL 104 99* 102 101  CO2 '26 27 30 29  '$ GLUCOSE 98 212* 125* 163*  BUN '12 17 13 13  '$ CALCIUM 8.5* 8.6* 8.6* 8.5*  CREATININE 0.64 0.69 0.62 0.77  GFRNONAA >60 >60 >60 >60  GFRAA >60 >60 >60 >60    LIVER FUNCTION TESTS:  Recent Labs  10/16/16 0409 10/17/16 0520 10/18/16 0335 10/19/16 0400  BILITOT 5.0* 5.8* 4.9* 4.9*  AST 189* 122* 55* 49*  ALT 166* 150*  103* 85*  ALKPHOS 871* 828* 622* 545*  PROT 6.2* 6.0* 5.5* 6.0*  ALBUMIN 2.8* 2.7* 2.4* 2.7*    Assessment and Plan: 1. Pancreatic cancer with obstructive jaundice, s/p PTC with I/E biliary drain placement 12/26 -LFTs trending down except TB; however, this may still be elevated for other reasons as well, such as his disease process.  I have capped his drain today.  I have thoroughly discussed with he and his wife indications to return his drain to a gravity bag.  These include worsening RUQ abdominal pain and/or bilious drainage around his drain site.  I have given them a bag in case this is needed. -drain can be flushed with 5cc NS daily -we will schedule PTC routine exchanges every 6-8 weeks as an outpatient.  The order has been written and we will contact the patient to set this up at a later date.  However, Dr. Ulyses Amor note has been noted and he may want to retry placement of his metallic biliary stent.  This can be discussed with Dr. Vernard Gambles for further recommendations regarding this  and/or timing. -will follow.  Electronically Signed: Henreitta Cea 10/19/2016, 9:31 AM   I spent a total of 15 Minutes at the the patient's bedside AND on the patient's hospital floor or unit, greater than 50% of which was counseling/coordinating care for obstructive jaundice

## 2016-10-19 NOTE — Progress Notes (Signed)
Subjective: No acute events.  Objective: Vital signs in last 24 hours: Temp:  [98 F (36.7 C)-98.2 F (36.8 C)] 98 F (36.7 C) (12/29 0451) Pulse Rate:  [62-72] 62 (12/29 0451) Resp:  [16] 16 (12/29 0451) BP: (147-161)/(76-82) 161/82 (12/29 0451) SpO2:  [99 %] 99 % (12/29 0451) Last BM Date: 10/18/16  Intake/Output from previous day: 12/28 0701 - 12/29 0700 In: 920 [P.O.:720; IV Piggyback:200] Out: 715 [Drains:715] Intake/Output this shift: Total I/O In: 410 [P.O.:360; IV Piggyback:50] Out: -   General appearance: alert and no distress GI: soft, non-tender; bowel sounds normal; no masses,  no organomegaly  Lab Results:  Recent Labs  10/17/16 0520 10/18/16 0335  WBC 6.6 6.3  HGB 10.5* 9.2*  HCT 30.5* 27.0*  PLT 170 147*   BMET  Recent Labs  10/17/16 0520 10/18/16 0335 10/19/16 0400  NA 135 139 137  K 3.6 3.6 3.5  CL 99* 102 101  CO2 _0 GLUCOSE 212* 125* 163*  BUN _1 CREATININE 0.69 0.62 0.77  CALCIUM 8.6* 8.6* 8.5*   LFT  Recent Labs  10/19/16 0400  PROT 6.0*  ALBUMIN 2.7*  AST 49*  ALT 85*  ALKPHOS 545*  BILITOT 4.9*   PT/INR No results for input(s): LABPROT, INR in the last 72 hours. Hepatitis Panel No results for input(s): HEPBSAG, HCVAB, HEPAIGM, HEPBIGM in the last 72 hours. C-Diff No results for input(s): CDIFFTOX in the last 72 hours. Fecal Lactopherrin No results for input(s): FECLLACTOFRN in the last 72 hours.  Studies/Results: No results found.  Medications:  Scheduled: . insulin aspart  0-9 Units Subcutaneous TID WC  . insulin detemir  30 Units Subcutaneous Daily  . lipase/protease/amylase  36,000 Units Oral TID AC  . loratadine  10 mg Oral Daily  . multivitamin with minerals  1 tablet Oral Daily  . pantoprazole  40 mg Oral Daily  . piperacillin-tazobactam (ZOSYN)  IV  3.375 g Intravenous Q8H  . polyethylene glycol  17 g Oral Daily  . potassium chloride  30 mEq Oral Once  . sodium chloride flush  3 mL  Intravenous Q12H   Continuous:   Assessment/Plan: 1) Occluded biliary stent s/p new PTC with int/ext drain. 2) Pancreatic cancer.   I spoke with Dr. Vernard Gambles and it appears to be feasible to try and internalize a new stent and to take out the clogged metallic stent.  I will make arrangements as an outpatient.  I will be able to place the covered metallic stent adjacent to the current int/ext biliary drain and subsequently this stent will be removed.  Plan: 1) Upon discharge I will make arrangements for an attempt to replace the metallic stent.  LOS: 7 days   Jacob Hardin 10/19/2016, 2:54 PM

## 2016-10-19 NOTE — Progress Notes (Signed)
Inpatient Diabetes Program Recommendations  AACE/ADA: New Consensus Statement on Inpatient Glycemic Control (2015)  Target Ranges:  Prepandial:   less than 140 mg/dL      Peak postprandial:   less than 180 mg/dL (1-2 hours)      Critically ill patients:  140 - 180 mg/dL   Results for CHAD, HOERIG (MRN [id number]) as of 10/19/2016 08:49  Ref. Range 10/18/2016 08:00 10/18/2016 12:02 10/18/2016 17:05 10/18/2016 21:27  Glucose-Capillary Latest Ref Range: 65 - 99 mg/dL 114 (H) 393 (H) 283 (H) 116 (H)    Home DM Meds: Levemir 40 units daily                             Novolog 4 units Qsupper  Current Insulin Orders: Levemir 30 units daily                                       Novolog Sensitive Correction Scale/ SSI (0-9 units) TID AC       MD- Patient having elevated glucose levels after meals.  Eating 75-100% of meals.  Please consider the following in-hospital insulin adjustments:  1. Start Novolog Meal Coverage: Novolog 4 units TID with meals (hold if pt eats <50% of meal)  2. Change diet to Carbohydrate Modified diet (currently ordered as Regular diet)    --Will follow patient during hospitalization--  Wyn Quaker RN, MSN, CDE Diabetes Coordinator Inpatient Glycemic Control Team Team Pager: [phone] (8a-5p)

## 2016-10-20 DIAGNOSIS — K838 Other specified diseases of biliary tract: Secondary | ICD-10-CM

## 2016-10-20 DIAGNOSIS — K831 Obstruction of bile duct: Secondary | ICD-10-CM

## 2016-10-20 LAB — GLUCOSE, CAPILLARY
GLUCOSE-CAPILLARY: 64 mg/dL — AB (ref 65–99)
GLUCOSE-CAPILLARY: 309 mg/dL — AB (ref 65–99)

## 2016-10-20 LAB — COMPREHENSIVE METABOLIC PANEL
ALT: 90 U/L — ABNORMAL HIGH (ref 17–63)
AST: 64 U/L — AB (ref 15–41)
Albumin: 3.1 g/dL — ABNORMAL LOW (ref 3.5–5.0)
Alkaline Phosphatase: 703 U/L — ABNORMAL HIGH (ref 38–126)
Anion gap: 8 (ref 5–15)
BUN: 15 mg/dL (ref 6–20)
CHLORIDE: 99 mmol/L — AB (ref 101–111)
CO2: 26 mmol/L (ref 22–32)
Calcium: 8.6 mg/dL — ABNORMAL LOW (ref 8.9–10.3)
Creatinine, Ser: 0.73 mg/dL (ref 0.61–1.24)
GFR calc Af Amer: >60 mL/min (ref >60)
Glucose, Bld: 207 mg/dL — ABNORMAL HIGH (ref 65–99)
POTASSIUM: 3.5 mmol/L (ref 3.5–5.1)
Sodium: 133 mmol/L — ABNORMAL LOW (ref 135–145)
Total Bilirubin: 5 mg/dL — ABNORMAL HIGH (ref 0.3–1.2)
Total Protein: 6.8 g/dL (ref 6.5–8.1)

## 2016-10-20 MED ORDER — AMOXICILLIN-POT CLAVULANATE 875-125 MG PO TABS: 1.0000 | ORAL_TABLET | Freq: Two times a day (BID) | ORAL | 0 refills | Status: DC

## 2016-10-20 MED ORDER — HYDROXYZINE HCL 25 MG PO TABS: 25.0000 mg | ORAL_TABLET | Freq: Three times a day (TID) | ORAL | 0 refills | Status: DC | PRN

## 2016-10-20 MED ORDER — HEPARIN SOD (PORK) LOCK FLUSH 100 UNIT/ML IV SOLN
500.0000 [IU] | INTRAVENOUS | Status: AC | PRN
  Administered 2016-10-20: 500 [IU]

## 2016-10-20 NOTE — Care Management Note (Signed)
Case Management Note  Patient Details  Name: Jacob Hardin MRN: [id number] Date of Birth: 03-11-1946  Subjective/Objective:   Sepsis, DM, Cancer of Pancreas                  Action/Plan: Discharge Planning: AVS reviewed:  NCM spoke to pt and wife at bedside. Offered choice for [facility]. Wife states they had [facility] in the past. [facility] Liaison with new referral. Wife was trained on how to manage drain. Unit RN will provide flushes for few days.    PCP [name] MD  Expected Discharge Date:  10/20/2016            Expected Discharge Plan:  [place]  In-House Referral:  NA  Discharge planning Services  CM Consult  Post Acute Care Choice:  Home Health Choice offered to:  Patient, Spouse  DME Arranged:  N/A DME Agency:  NA  HH Arranged:  NA HH Agency:  NA  Status of Service:  Completed, signed off  If discussed at [place] of Stay Meetings, dates discussed:    Additional Comments:  Erenest Rasher, RN 10/20/2016, 3:10 PM

## 2016-10-20 NOTE — Discharge Summary (Signed)
Physician Discharge Summary  Jacob Hardin [id number] DOB: September 12, 1946 DOA: 10/12/2016  PCP: Jacob Fraction, MD  Admit date: 10/12/2016 Discharge date: 10/20/2016  Time spent: 35 minutes  Recommendations for Outpatient Follow-up:  Repeat CMET to follow electrolytes, renal function and LFT's Follow clinical response and complete resolution of infection   Discharge Diagnoses:  Principal Problem:   Sepsis ([place]) Active Problems:   Diabetes mellitus without complication (Jacob Hardin)   Cancer of head of pancreas (Jacob Hardin)   Metastatic cancer (Jacob Hardin)   Immunocompromised patient (Jacob Hardin)   Malnutrition of moderate degree   GERD (gastroesophageal reflux disease)   Hyperbilirubinemia   Hypokalemia   Biliary obstruction   Obstructive jaundice   Discharge Condition: stable and improved. Will discharge home with [facility] services. Follow up with PCP in 2 weeks and with GI/oncology service as instructed.   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 Benson Jacob Hardin, (had placement of a percutaneous internal/external biliary drain 11/25/2015; biliary drainage catheter capped 12/02/2015; biliary drain internalized 12/13/2015), HTN, HLD, DM, and GERD, who presented 12/22 with chills. He had no other significant symptoms, but had similar presentation in February of this year that developed into sepsis. On arrival, work up was significant for abdominal U/S showing sludge with probable nonshadowing gallstones in thickened edematous gallbladder as well as dilated biliary ducts with air. WBC 3.2, lactate 1.37, negative urinalysis, potassium 3.4, creatinine 1.04, abnormal liver function with total bilirubin 5.8, ALP 871, AST 175 andALT 173.  temperature 100.2, tachycardia, oxygen saturation 97% on room air. Oncology and GI were consulted and he was admitted under [facility] for further evaluation and management.  Hospital Course:  Sepsis possibly due tocholestasis/cholangitis: Patient hasabnormal liver function with increased total bilirubin plus abdominal ultrasound findings, consistent with biliary system infection. Sepsis features resolving.  -Hemodynamically stable currently. 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 Will follow final cx's results  -will discharge on augmentin BID for 10 days -patient advise to maintain himself well hydrated Jacob [facility]in arrange for care and flushes of biliary drain.   - Blood cultures x 2 NGTD - Lactate wnl. - patient afebrile over 40 hours at discharge and WBC stable now  Biliary obstruction/hyperbilirubinemia:  -T Bili 5.8 on admission, slowly rising as outpatient with evidence of GB inflammation on U/S.  -CT showed distended gallbladder without inflammation. No evidence of hemolysis. EGD 12/24 showed biliary stent occlusion.  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 Benay Hardin. Patient is on chemotherapy, last dose was 3 weeks ago, recently held for hyperbilirubinemia. CA-19-9 was 283 on 10/10/16 -continue f/u with Dr. Benay Hardin as outpatient -continue Creon   Hypokalemia:  -Due to poor po from frequent NPO status for procedures -repleted and WNL at discharge (K 3.5)  DM-II:Last A1c 7.0 on 09/19/15, well controled.  -will resume home hypoglycemic regimen   Malnutrition of moderate  degree: -Nutrition consultation appreciated -patient instructed on feeding supplements  GERD: -continue PPI  Itching and constipation  -will continue miralax daily for now; holding parameters if he develops diarrhea  -continue also adjusted dose of atarax for itching. Anticipate improvement in this symptom once bilirubin level normalizes    Procedures:  EGD 12/24  Placement of percutaneous internal/external biliary drain 10/16/16  Consultations:  GI  IR  Oncology service   Discharge Exam: Vitals:   10/19/16 2302 10/20/16 0451  BP:  (!) 161/81  Pulse:  (!) 57  Resp:  20  Temp: 98.8 F (37.1 C) 98.2 F (36.8 C)   General exam: Jaundiced 70 y.o. male in no distress, denies abd pain, nausea and vomiting. Patient now afebrile for > 40 hours. Reports improvement on itching. Positive BM and passing gas.  Respiratory system: Non-labored breathing room air. Clear to auscultation bilaterally.  Cardiovascular system: Regular rate and rhythm. No murmur, rub, or gallop. No JVD, and no pedal edema. Gastrointestinal system: Abdomen soft, absolutely not tender, non-distended, with normoactive bowel sounds. No organomegaly or masses felt. Central nervous system: Alert and oriented. No focal neurological deficits. Extremities: Warm, no deformities Skin: No rashes. Right upper quadrant (lateral aspect) with percutaneous biliary drain, no signs of infection and currently capped. No erythema appreciated.  Psychiatry: Judgement and insight appear normal. Mood & affect appropriate.   Discharge Instructions   Discharge Instructions    Discharge instructions    Complete by:  As directed    Take medications as prescribed  Keep yourself well hydrated  Follow up with Dr. Benson Jacob Hardin as instructed Contact oncology service office to confirmed next follow up appointment Follow up with PCP in 2 weeks     Current Discharge Medication List    START taking these medications   Details   amoxicillin-clavulanate (AUGMENTIN) 875-125 MG tablet Take 1 tablet by mouth 2 (two) times daily. Qty: 20 tablet, Refills: 0    hydrOXYzine (ATARAX/VISTARIL) 25 MG tablet Take 1 tablet (25 mg total) by mouth every 8 (eight) hours as needed for itching. Qty: 30 tablet, Refills: 0      CONTINUE these medications which have NOT CHANGED   Details  baclofen (LIORESAL) 10 MG tablet Take 5 mg by mouth 3 (three) times daily as needed (for hiccups).    esomeprazole (NEXIUM) 40 MG capsule Take 40 mg by mouth daily.   Associated Diagnoses: Cancer of head of pancreas (HCC)    insulin aspart (NOVOLOG FLEXPEN) 100 UNIT/ML FlexPen Inject 4 Units into the skin daily before supper.    Insulin Detemir (LEVEMIR FLEXTOUCH) 100 UNIT/ML Pen Inject 40 Units into the skin daily.    lidocaine-prilocaine (EMLA) cream Apply 1 application topically as needed (prior to accessing port).    lipase/protease/amylase (CREON) 36000 UNITS CPEP capsule Take 1 capsule (36,000 Units total) by mouth 3 (three) times daily before meals. Qty: 90 capsule, Refills: 1   Associated Diagnoses: Cancer of head of pancreas (HCC)    loperamide (IMODIUM) 2 MG capsule Take 2 mg by mouth as needed for diarrhea or loose stools.    loratadine (CLARITIN) 10 MG tablet Take 10 mg by mouth daily.     Multiple Vitamin (MULTIVITAMIN WITH MINERALS) TABS tablet Take 1 tablet by mouth daily.    ondansetron (ZOFRAN) 8 MG tablet Take 8 mg by mouth every 6 (six) hours as needed for nausea or vomiting.    Associated Diagnoses: Cancer of head of pancreas ([place])    polyethylene glycol (MIRALAX / GLYCOLAX) packet Take 17 g by mouth daily as needed for  mild constipation.    Associated Diagnoses: Cancer of head of pancreas (HCC)    prochlorperazine (COMPAZINE) 10 MG tablet Take 1 tablet (10 mg total) by mouth every 6 (six) hours as needed for nausea or vomiting. Qty: 30 tablet, Refills: 0       Allergies  Allergen Reactions  . Codeine Nausea Only    Follow-up Information    PICKARD,WARREN TOM, MD. Schedule an appointment as soon as possible for a visit in 2 week(s).   Specialty:  Family Medicine Contact information: [address] [place] [address] [phone]           The results of significant diagnostics from this hospitalization (including imaging, microbiology, ancillary and laboratory) are listed below for reference.    Significant Diagnostic Studies: Dg Chest 2 View  Result Date: 10/12/2016 CLINICAL DATA:  Fever, chills, pancreatic cancer EXAM: CHEST  2 VIEW COMPARISON:  01/09/2016 FINDINGS: Lungs are clear.  No pleural effusion or pneumothorax. The heart is normal in size. Right chest port terminates in the lower SVC. IMPRESSION: No evidence of acute cardiopulmonary disease. Electronically Signed   By: Julian Hy M.D.   On: 10/12/2016 19:53   US Abdomen Complete  Result Date: 10/11/2016 CLINICAL DATA:  History of pancreatic head carcinoma with biliary stent present EXAM: ABDOMEN ULTRASOUND COMPLETE COMPARISON:  CT abdomen and pelvis July 30, 2016 FINDINGS: Gallbladder: Within the gallbladder, there are multiple echogenic foci which move but do not shadow. Gallbladder wall is mildly thickened and subtly edematous. There is no pericholecystic fluid. Largest individual echogenic focus measures 9 mm in length. The patient is not focally tender over the gallbladder during this examination. Common bile duct: Diameter: 11 mm, dilated. Areas seen in the intrahepatic ducts as well as in the common bile duct. Stent not well seen, likely due to surrounding air. Liver: The liver has an inhomogeneous appearance with several nodular appearing areas throughout the liver ranging in size from 1-2 cm. Assessment of the liver is somewhat less than optimal due to air within the biliary ducts stool system. IVC: No abnormality visualized. Pancreas: There is a mass in the head of the pancreas which is somewhat hypoechoic  measuring 1.9 x 1.6 x 1.2 cm. Note that portions of pancreas are obscured by gas. Spleen: Size and appearance within normal limits. Right Kidney: Length: 12.0 cm. Echogenicity within normal limits. No mass or hydronephrosis visualized. Left Kidney: Length: 10.0 cm. Echogenicity within normal limits. No mass or hydronephrosis visualized. Abdominal aorta: No aneurysm visualized. Note that gas obscures portions of the mid the distal aorta. Other findings: No evidence of ascites. IMPRESSION: Gallbladder contains sludge with probable nonshadowing gallstones. Gallbladder wall is thickened and subtly edematous. Suspect a degree of acute cholecystitis. Biliary ducts are dilated and contain air. Liver shows abnormal echogenicity with nodular opacities throughout the liver, concerning for metastatic disease superimposed on hepatic steatosis. Small mass in head of pancreas. Note that portions of pancreas not visualized. Portions of aorta not visualized. Visualized portions of aorta appear normal. Given this constellation of findings, it may be prudent to consider correlation with CT or MR of the abdomen, ideally with oral and intravenous contrast, to further evaluate. In particular, contrast-enhanced CT or MR could be helpful to assess for degree of potential hepatic metastatic disease. These results will be called to the ordering clinician or representative by the Radiologist Assistant, and communication documented in the PACS or zVision Dashboard. Electronically Signed   By: Lowella Grip III M.D.  On: 10/11/2016 08:53   Ct Abdomen Pelvis W Contrast  Result Date: 10/13/2016 CLINICAL DATA:  History of pancreatic carcinoma with fever and chills EXAM: CT ABDOMEN AND PELVIS WITH CONTRAST TECHNIQUE: Multidetector CT imaging of the abdomen and pelvis was performed using the standard protocol following bolus administration of intravenous contrast. CONTRAST:  144mL ISOVUE-300 IOPAMIDOL (ISOVUE-300) INJECTION 61%, 70mL  ISOVUE-300 IOPAMIDOL (ISOVUE-300) INJECTION 61% COMPARISON:  07/30/2016, 10/11/2016 FINDINGS: Lower chest: No acute abnormality. Hepatobiliary: Scattered hypodensities are again identified throughout the liver consistent with metastatic disease. The overall appearance is stable. Some differential in the enhancement of the liver is noted related to the timing of the contrast bolus. No significant biliary ductal dilatation is seen. The gallbladder is well distended. Common bile duct stent is noted in satisfactory position. Cement soft tissue density is noted within which may represent debris. Pancreas: Fullness in the pancreatic head is again noted consistent with the patient's given clinical history of pancreatic carcinoma. Common bile duct stent courses through this area. Spleen: Normal in size without focal abnormality. Adrenals/Urinary Tract: Adrenal glands are unremarkable. Kidneys are normal, without renal calculi, focal lesion, or hydronephrosis. Bladder is unremarkable. Stomach/Bowel: Stent is noted within the duodenum proximally which appears patent. The appendix is within normal limits. Scattered diverticular change of the colon is noted without diverticulitis. No obstructive changes are seen. The CT Um and proximal portion of the ascending colon are predominately decompressed with some mild pericolonic inflammatory changes. This may represent some focal colitis. Vascular/Lymphatic: Aortic atherosclerosis. No enlarged abdominal or pelvic lymph nodes. Reproductive: Prostate is unremarkable. Other: Minimal ascites is noted along the liver. Musculoskeletal: No acute or significant osseous findings. IMPRESSION: Changes consistent with the known history of pancreatic head mass with liver metastatic disease. Duodenal and common bile duct stents are noted in place. Changes suggestive of focal colitis within the right colon Electronically Signed   By: Inez Catalina M.D.   On: 10/13/2016 17:25   Ir Biliary Drain  Placement With Cholangiogram  Result Date: 10/16/2016 INDICATION: Biliary obstruction.  Pancreatic cancer. EXAM: IR BILIARY DRAIN PLACEMENT W/ CHOLANGIOGRAM MEDICATIONS: Patient is on antibiotics; The antibiotic was administered within an appropriate time frame prior to the initiation of the procedure. ANESTHESIA/SEDATION: Moderate (conscious) sedation was employed during this procedure. A total of Versed 4 mg and Fentanyl 250 mcg was administered intravenously. Moderate Sedation Time: 49 minutes. The patient's level of consciousness and vital signs were monitored continuously by radiology nursing throughout the procedure under my direct supervision. FLUOROSCOPY TIME:  Fluoroscopy Time: 18 minutes 48 seconds (556 mGy). COMPLICATIONS: None immediate. PROCEDURE: Informed written consent was obtained from the patient after a thorough discussion of the procedural risks, benefits and alternatives. All questions were addressed. Maximal Sterile Barrier Technique was utilized including caps, mask, sterile gowns, sterile gloves, sterile drape, hand hygiene and skin antiseptic. A timeout was performed prior to the initiation of the procedure. The right flank was prepped and draped in a sterile fashion. 1% lidocaine was utilized for local anesthesia. Under sonographic guidance, a Chiba needle was advanced into the common hepatic duct. Contrast was injected opacifying the biliary system. A 21 gauge needle was then utilized to puncture a peripheral right hepatic duct under fluoroscopic guidance. It was removed over a 018 wire which was up sized to a 3 J. A 5 French Kumpe catheter was advanced over the 3 J. The catheter was then advanced over a glidewire into the duodenum across the common hepatic duct obstruction. The Kumpe be catheter was  then removed over an Amplatz wire. A 10 [place] biliary drain was then advanced over the Amplatz wire. The tip was coiled in the duodenum. It was string fixed. Contrast was injected.  FINDINGS: The initial cholangiogram confirms common hepatic duct obstruction above the existing stent. Subsequent images demonstrate access into the biliary system via a peripheral right hepatic duct. The final image demonstrates a right internal external biliary drain with its tip coiled in the duodenum. IMPRESSION: Initial cholangiography confirms biliary obstruction in the common hepatic duct above the extent of the stent. A right internal external biliary drain was successful with its tip coiled in the duodenum. The patient will return for follow-up cholangiogram and possible stent placement. Electronically Signed   By: Marybelle Killings M.D.   On: 10/16/2016 16:52    Microbiology: Recent Results (from the past 240 hour(s))  Culture, blood (x 2)     Status: None   Collection Time: 10/12/16  4:12 PM  Result Value Ref Range Status   Specimen Description PORTA CATH RIGHT  Final   Special Requests BOTTLES DRAWN AEROBIC AND ANAEROBIC 5CC  Final   Culture   Final    NO GROWTH 5 DAYS Performed at [facility]    Report Status 10/17/2016 FINAL  Final  Respiratory Panel by PCR     Status: None   Collection Time: 10/12/16  7:00 PM  Result Value Ref Range Status   Adenovirus NOT DETECTED NOT DETECTED Final   Coronavirus 229E NOT DETECTED NOT DETECTED Final   Coronavirus HKU1 NOT DETECTED NOT DETECTED Final   Coronavirus NL63 NOT DETECTED NOT DETECTED Final   Coronavirus OC43 NOT DETECTED NOT DETECTED Final   Metapneumovirus NOT DETECTED NOT DETECTED Final   Rhinovirus / Enterovirus NOT DETECTED NOT DETECTED Final   Influenza A NOT DETECTED NOT DETECTED Final   Influenza B NOT DETECTED NOT DETECTED Final   Parainfluenza Virus 1 NOT DETECTED NOT DETECTED Final   Parainfluenza Virus 2 NOT DETECTED NOT DETECTED Final   Parainfluenza Virus 3 NOT DETECTED NOT DETECTED Final   Parainfluenza Virus 4 NOT DETECTED NOT DETECTED Final   Respiratory Syncytial Virus NOT DETECTED NOT DETECTED Final    Bordetella pertussis NOT DETECTED NOT DETECTED Final   Chlamydophila pneumoniae NOT DETECTED NOT DETECTED Final   Mycoplasma pneumoniae NOT DETECTED NOT DETECTED Final    Comment: Performed at [facility]  Culture, blood (x 2)     Status: None   Collection Time: 10/12/16  8:17 PM  Result Value Ref Range Status   Specimen Description RIGHT ANTECUBITAL  Final   Special Requests BOTTLES DRAWN AEROBIC AND ANAEROBIC 5CC  Final   Culture   Final    NO GROWTH 5 DAYS Performed at [facility]    Report Status 10/17/2016 FINAL  Final     Labs: Basic Metabolic Panel:  Recent Labs Lab 10/16/16 0409 10/17/16 0520 10/18/16 0335 10/19/16 0400 10/20/16 0910  NA 137 135 139 137 133*  K 3.4* 3.6 3.6 3.5 3.5  CL 104 99* 102 101 99*  CO2 26 27 30 29 26   GLUCOSE 98 212* 125* 163* 207*  BUN 12 17 13 13 15   CREATININE 0.64 0.69 0.62 0.77 0.73  CALCIUM 8.5* 8.6* 8.6* 8.5* 8.6*  MG  --  2.0  --   --   --    Liver Function Tests:  Recent Labs Lab 10/16/16 0409 10/17/16 0520 10/18/16 0335 10/19/16 0400 10/20/16 0910  AST 189* 122* 55* 49*  64*  ALT 166* 150* 103* 85* 90*  ALKPHOS 871* 828* 622* 545* 703*  BILITOT 5.0* 5.8* 4.9* 4.9* 5.0*  PROT 6.2* 6.0* 5.5* 6.0* 6.8  ALBUMIN 2.8* 2.7* 2.4* 2.7* 3.1*   CBC:  Recent Labs Lab 10/14/16 0458 10/15/16 0433 10/16/16 0409 10/17/16 0520 10/18/16 0335  WBC 4.1 3.2* 4.2 6.6 6.3  NEUTROABS  --   --  2.6 4.8 4.4  HGB 10.0* 9.9* 10.0* 10.5* 9.2*  HCT 29.5* 28.5* 28.3* 30.5* 27.0*  MCV 90.5 90.2 89.0 90.5 90.9  PLT 110* 114* 126* 170 147*    CBG:  Recent Labs Lab 10/19/16 1211 10/19/16 1707 10/19/16 2103 10/20/16 0808 10/20/16 1219  GLUCAP 360* 260* 187* 64* 309*    Signed:  Barton Dubois MD.  [facility] 10/20/2016, 2:35 PM

## 2016-10-20 NOTE — Progress Notes (Signed)
CBG 64. Breakfast at bedside. Will continue to monitor.   Callie Fielding RN

## 2016-10-24 ENCOUNTER — Other Ambulatory Visit: Payer: Self-pay | Admitting: Gastroenterology

## 2016-10-25 ENCOUNTER — Encounter (HOSPITAL_COMMUNITY): Payer: Self-pay | Admitting: *Deleted

## 2016-10-25 NOTE — Progress Notes (Signed)
Spoke with pt's wife, Butch Penny for pre-op call. She states pt does not have a cardiac history and has not complained of chest pain or sob. Pt is diabetic. States fasting blood sugar ranges between 74-150. Instructed her to have pt take 1/2 of regular dose of Levemir in the AM (will take 20 units). Instructed her to have pt check his blood sugar in the AM. If blood sugar is 70 or below, treat with 1/2 cup of clear juice (apple or cranberry) and recheck blood sugar 15 minutes after drinking juice. If blood sugar continues to be 70 or below, call the Endoscopy Department and ask to speak to a nurse.  Ekg - 10/12/16 - in EPIC Echo - 01/21/16 - in EPIC

## 2016-10-26 ENCOUNTER — Ambulatory Visit (HOSPITAL_COMMUNITY)
Admission: RE | Admit: 2016-10-26 | Discharge: 2016-10-26 | Disposition: A | Discharge status: Home / Self Care | Payer: Medicare Other | Source: Ambulatory Visit | Attending: Gastroenterology | Admitting: Gastroenterology

## 2016-10-26 ENCOUNTER — Ambulatory Visit (HOSPITAL_COMMUNITY): Payer: Medicare Other

## 2016-10-26 ENCOUNTER — Ambulatory Visit (HOSPITAL_COMMUNITY): Payer: Medicare Other | Admitting: Anesthesiology

## 2016-10-26 ENCOUNTER — Encounter (HOSPITAL_COMMUNITY)
Admission: RE | Disposition: A | Discharge status: Home / Self Care | Payer: Self-pay | Source: Ambulatory Visit | Attending: Gastroenterology

## 2016-10-26 ENCOUNTER — Encounter (HOSPITAL_COMMUNITY): Payer: Self-pay | Admitting: *Deleted

## 2016-10-26 DIAGNOSIS — E876 Hypokalemia: Secondary | ICD-10-CM | POA: Diagnosis not present

## 2016-10-26 DIAGNOSIS — Z6822 Body mass index (BMI) 22.0-22.9, adult: Secondary | ICD-10-CM | POA: Diagnosis not present

## 2016-10-26 DIAGNOSIS — Z8507 Personal history of malignant neoplasm of pancreas: Secondary | ICD-10-CM | POA: Diagnosis not present

## 2016-10-26 DIAGNOSIS — E785 Hyperlipidemia, unspecified: Secondary | ICD-10-CM | POA: Diagnosis not present

## 2016-10-26 DIAGNOSIS — C784 Secondary malignant neoplasm of small intestine: Secondary | ICD-10-CM | POA: Diagnosis not present

## 2016-10-26 DIAGNOSIS — Z885 Allergy status to narcotic agent status: Secondary | ICD-10-CM | POA: Insufficient documentation

## 2016-10-26 DIAGNOSIS — K831 Obstruction of bile duct: Secondary | ICD-10-CM | POA: Insufficient documentation

## 2016-10-26 DIAGNOSIS — C787 Secondary malignant neoplasm of liver and intrahepatic bile duct: Secondary | ICD-10-CM | POA: Insufficient documentation

## 2016-10-26 DIAGNOSIS — E440 Moderate protein-calorie malnutrition: Secondary | ICD-10-CM | POA: Diagnosis not present

## 2016-10-26 DIAGNOSIS — E119 Type 2 diabetes mellitus without complications: Secondary | ICD-10-CM | POA: Diagnosis not present

## 2016-10-26 DIAGNOSIS — K219 Gastro-esophageal reflux disease without esophagitis: Secondary | ICD-10-CM | POA: Diagnosis not present

## 2016-10-26 DIAGNOSIS — Z794 Long term (current) use of insulin: Secondary | ICD-10-CM | POA: Insufficient documentation

## 2016-10-26 DIAGNOSIS — Z9221 Personal history of antineoplastic chemotherapy: Secondary | ICD-10-CM | POA: Insufficient documentation

## 2016-10-26 DIAGNOSIS — Z4659 Encounter for fitting and adjustment of other gastrointestinal appliance and device: Secondary | ICD-10-CM | POA: Diagnosis not present

## 2016-10-26 DIAGNOSIS — I10 Essential (primary) hypertension: Secondary | ICD-10-CM | POA: Insufficient documentation

## 2016-10-26 DIAGNOSIS — D899 Disorder involving the immune mechanism, unspecified: Secondary | ICD-10-CM | POA: Diagnosis not present

## 2016-10-26 HISTORY — PX: ERCP: SHX5425

## 2016-10-26 HISTORY — DX: Restless legs syndrome: G25.81

## 2016-10-26 HISTORY — DX: Gastro-esophageal reflux disease without esophagitis: K21.9

## 2016-10-26 LAB — HEPATIC FUNCTION PANEL
ALT: 66 U/L — AB (ref 17–63)
AST: 65 U/L — ABNORMAL HIGH (ref 15–41)
Albumin: 3 g/dL — ABNORMAL LOW (ref 3.5–5.0)
Alkaline Phosphatase: 518 U/L — ABNORMAL HIGH (ref 38–126)
BILIRUBIN DIRECT: 2.1 mg/dL — AB (ref 0.1–0.5)
BILIRUBIN INDIRECT: 1.7 mg/dL — AB (ref 0.3–0.9)
BILIRUBIN TOTAL: 3.8 mg/dL — AB (ref 0.3–1.2)
Total Protein: 6.7 g/dL (ref 6.5–8.1)

## 2016-10-26 LAB — GLUCOSE, CAPILLARY
GLUCOSE-CAPILLARY: 112 mg/dL — AB (ref 65–99)
Glucose-Capillary: 133 mg/dL — ABNORMAL HIGH (ref 65–99)
Glucose-Capillary: 118 mg/dL — ABNORMAL HIGH (ref 65–99)

## 2016-10-26 SURGERY — ERCP, WITH INTERVENTION IF INDICATED
Anesthesia: General

## 2016-10-26 MED ORDER — SUCCINYLCHOLINE CHLORIDE 20 MG/ML IJ SOLN
INTRAMUSCULAR | Status: DC | PRN
  Administered 2016-10-26: 60 mg via INTRAVENOUS

## 2016-10-26 MED ORDER — ONDANSETRON HCL 4 MG/2ML IJ SOLN
INTRAMUSCULAR | Status: DC | PRN
  Administered 2016-10-26: 4 mg via INTRAVENOUS

## 2016-10-26 MED ORDER — IOPAMIDOL (ISOVUE-300) INJECTION 61%
INTRAVENOUS | Status: AC
  Filled 2016-10-26: qty 50

## 2016-10-26 MED ORDER — GLUCAGON HCL RDNA (DIAGNOSTIC) 1 MG IJ SOLR
INTRAMUSCULAR | Status: AC
  Filled 2016-10-26: qty 1

## 2016-10-26 MED ORDER — LACTATED RINGERS IV SOLN
INTRAVENOUS | Status: DC
  Administered 2016-10-26: 13:00:00 via INTRAVENOUS
  Administered 2016-10-26: 1000 mL via INTRAVENOUS

## 2016-10-26 MED ORDER — PHENYLEPHRINE HCL 10 MG/ML IJ SOLN
INTRAVENOUS | Status: DC | PRN
  Administered 2016-10-26: 50 ug/min via INTRAVENOUS

## 2016-10-26 MED ORDER — INDOMETHACIN 50 MG RE SUPP
RECTAL | Status: DC | PRN
  Administered 2016-10-26 (×2): 50 mg via RECTAL

## 2016-10-26 MED ORDER — SODIUM CHLORIDE 0.9 % IV SOLN: INTRAVENOUS | Status: DC

## 2016-10-26 MED ORDER — ARTIFICIAL TEARS OP OINT
TOPICAL_OINTMENT | OPHTHALMIC | Status: DC | PRN
  Administered 2016-10-26: 1 via OPHTHALMIC

## 2016-10-26 MED ORDER — FENTANYL CITRATE (PF) 100 MCG/2ML IJ SOLN
INTRAMUSCULAR | Status: DC | PRN
  Administered 2016-10-26 (×2): 50 ug via INTRAVENOUS

## 2016-10-26 MED ORDER — PROPOFOL 10 MG/ML IV BOLUS
INTRAVENOUS | Status: DC | PRN
  Administered 2016-10-26: 140 mg via INTRAVENOUS

## 2016-10-26 MED ORDER — INDOMETHACIN 50 MG RE SUPP
RECTAL | Status: AC
  Filled 2016-10-26: qty 2

## 2016-10-26 MED ORDER — LIDOCAINE HCL (CARDIAC) 20 MG/ML IV SOLN
INTRAVENOUS | Status: DC | PRN
  Administered 2016-10-26: 60 mg via INTRAVENOUS

## 2016-10-26 MED ORDER — SODIUM CHLORIDE 0.9 % IV SOLN
INTRAVENOUS | Status: DC | PRN
  Administered 2016-10-26: 35 mL

## 2016-10-26 MED ORDER — EPHEDRINE SULFATE 50 MG/ML IJ SOLN
INTRAMUSCULAR | Status: DC | PRN
  Administered 2016-10-26: 10 mg via INTRAVENOUS
  Administered 2016-10-26: 5 mg via INTRAVENOUS
  Administered 2016-10-26 (×2): 10 mg via INTRAVENOUS

## 2016-10-26 MED ORDER — LIDOCAINE HCL 4 % EX SOLN
CUTANEOUS | Status: DC | PRN
  Administered 2016-10-26: 3 mL via TOPICAL

## 2016-10-26 SURGICAL SUPPLY — 1 items: wallflex biliary (Stent) ×2 IMPLANT

## 2016-10-26 NOTE — H&P (View-Only) (Signed)
PROGRESS NOTE  Jacob Hardin  [id number] DOB: [birth year] DOA: 10/12/2016 PCP: [name], MD  Outpatient Specialists: Dr [name], gastroenterology  Brief Narrative: Jacob Hardin is a 71 y.o. 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 [name], (had placement of a percutaneous internal/external biliary drain 11/25/2015; biliary drainage catheter capped 12/02/2015; biliary drain internalized 12/13/2015), HTN, HLD, DM, and GERD, who presented 12/22 with chills. He had no other significant symptoms, but had similar presentation in February of this year that developed into sepsis. On arrival, work up was significant for abdominal U/S showing sludge with probable nonshadowing gallstones in thickened edematous gallbladder as well as dilated biliary ducts with air. WBC 3.2, lactate 1.37, negative urinalysis, potassium 3.4, creatinine 1.04, abnormal liver function with total bilirubin 5.8, ALP 871, AST 175 and ALT 173. temperature 100.2, tachycardia, oxygen saturation 97% on room air. Oncology and GI were consulted and he was admitted for treatment of sepsis due to presumed acute cholecystitis. Subsequent CT showed distended GB without evidence of biliary obstruction. EGD 12/24 showed obstructing debris in biliary stent. IR consulted and planning percutaneous transhepatic cholangiogram/drain placement in IR 12/26.  Assessment & Plan: Principal Problem:   Sepsis ([place]) Active Problems:   Diabetes mellitus without complication ([place])   Cancer of head of pancreas ([place])   Metastatic cancer ([place])   Immunocompromised patient ([place])   Malnutrition of moderate degree   GERD (gastroesophageal reflux disease)   Hyperbilirubinemia   Hypokalemia  Sepsis possibly due to cholestasis: Patient has abnormal liver function with increased total bilirubin plus abdominal ultrasound  findings, consistent with biliary system infection. Sepsis features resolving.  -Hemodynamically stable currently. 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 Will follow final cx's results  - Continue IVF's, antipyretics and supportive care -now afebrile for 24 hours after use of zosyn; will monitor for another 24 hours. Planning to discharge on Augmentin on 12/30 if he remains stable.  - Blood cultures x 2 NGTD - Lactate wnl. - Monitor CBC w/diff: WBC stable now  Biliary obstruction/hyperbilirubinemia:  -T Bili 5.8 on admission, slowly rising as outpatient with evidence of GB inflammation on U/S.  -CT showed distended gallbladder without inflammation. No evidence of hemolysis. EGD 12/24 showed biliary stent occlusion.  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 [name].  Patient is on chemotherapy, last dose was 3 weeks ago, recently held for hyperbilirubinemia. CA-19-9 was 283 on 10/10/16 -continue f/u with Dr. [name] as outpatient  Hypokalemia: K= 3.4, replete and recheck. Due to poor po from frequent NPO status for procedures  DM-II: Last A1c 7.0 on 09/19/15, well controled. Patient is taking Levemir at home -will continue adjusted lantus dose while inpatient  -SSI -follow CBG  Malnutrition of moderate degree: -Nutrition consult requested  -will follow rec's for feeding supplements   GERD: -continue Protonix  Itching and constipation  -will continue miralax daily for now; holding parameters if he develops diarrhea  -continue also adjusted dose of atarax for itching. Anticipate improvement in this symptom once bilirubin level normalizes   DVT prophylaxis: SCDs Code Status: Partial  code, DNI, otherwise all resuscitative measures.  Family Communication: Wife at bedside Disposition Plan: Continued  treatment of sepsis, pending further  IR procedure/rec's; will continue current bowel regimen and adjusted dose of atarax   Consultants:   Gastroenterology, Dr. [name]  Oncology   Interventional radiology  Procedures:   EGD 12/24  Antimicrobials:  Cefepime 12/22 >> 12/28  Zosyn 12/28  Subjective: Pt feels ok. Reports no nausea, no vomiting, improvement in his itching and endorses positive BM overnight. Afebrile over 24 hours now.  Objective: Vitals:   10/18/16 1427 10/18/16 2122 10/19/16 0451 10/19/16 1536  BP: 126/78 (!) 147/76 (!) 161/82 138/67  Pulse: 68 72 62 67  Resp: 16 16 16 16   Temp: 97.9 F (36.6 C) 98.2 F (36.8 C) 98 F (36.7 C) 98 F (36.7 C)  TempSrc: Oral Oral Oral Oral  SpO2: 98% 99% 99% 99%  Weight:      Height:        Intake/Output Summary (Last 24 hours) at 10/19/16 1617 Last data filed at 10/19/16 1200  Gross per 24 hour  Intake              990 ml  Output              715 ml  Net              275 ml   Filed Weights   10/12/16 2300  Weight: 73.6 kg (162 lb 4.1 oz)    Examination: General exam: Jaundiced 71 y.o. male in no distress, denies abd pain, nausea and vomiting. Patient now afebrile for > 24 hours. Reports improvement on itching. Positive BM reported overnight as well and endorses no abd pain. Respiratory system: Non-labored breathing room air. Clear to auscultation bilaterally.  Cardiovascular system: Regular rate and rhythm. No murmur, rub, or gallop. No JVD, and no pedal edema. Gastrointestinal system: Abdomen soft, absolutely not tender, non-distended, with normoactive bowel sounds. No organomegaly or masses felt. Central nervous system: Alert and oriented. No focal neurological deficits. Extremities: Warm, no deformities Skin: No rashes. Right upper quadrant with percutaneous biliary drain; good drainage, no  erythema.  Psychiatry: Judgement and insight appear normal. Mood & affect appropriate.   Data Reviewed: I have personally reviewed following labs and imaging studies  CBC:  Recent Labs Lab 10/14/16 0458 10/15/16 0433 10/16/16 0409 10/17/16 0520 10/18/16 0335  WBC 4.1 3.2* 4.2 6.6 6.3  NEUTROABS  --   --  2.6 4.8 4.4  HGB 10.0* 9.9* 10.0* 10.5* 9.2*  HCT 29.5* 28.5* 28.3* 30.5* 27.0*  MCV 90.5 90.2 89.0 90.5 90.9  PLT 110* 114* 126* 170 [id number]*   Basic Metabolic Panel:  Recent Labs Lab 10/15/16 0433 10/16/16 0409 10/17/16 0520 10/18/16 0335 10/19/16 0400  NA 139 137 135 139 137  K 3.4* 3.4* 3.6 3.6 3.5  CL 107 104 99* 102 101  CO2 26 26 27 30 29   GLUCOSE 195* 98 212* 125* 163*  BUN 11 12 17 13 13   CREATININE 0.56* 0.64 0.69 0.62 0.77  CALCIUM 8.7* 8.5* 8.6* 8.6* 8.5*  MG  --   --  2.0  --   --    GFR: Estimated Creatinine Clearance: 92 mL/min (by C-G formula based on SCr of 0.77 mg/dL).   Liver Function Tests:  Recent Labs Lab 10/15/16 0433 10/16/16 0409 10/17/16 0520 10/18/16 0335 10/19/16 0400  AST 154* 189* 122* 55* 49*  ALT 147* 166* 150* 103* 85*  ALKPHOS 753* 871* 828* 622* 545*  BILITOT 5.5* 5.0* 5.8* 4.9* 4.9*  PROT 5.9* 6.2* 6.0* 5.5* 6.0*  ALBUMIN  2.8* 2.8* 2.7* 2.4* 2.7*   Coagulation Profile:  Recent Labs Lab 10/12/16 2021  INR 0.98   CBG:  Recent Labs Lab 10/18/16 1202 10/18/16 1705 10/18/16 2127 10/19/16 0742 10/19/16 1211  GLUCAP 393* 283* 116* 149* 360*   Urine analysis:    Component Value Date/Time   COLORURINE AMBER (A) 10/12/2016 1540   APPEARANCEUR CLEAR 10/12/2016 1540   LABSPEC 1.035 (H) 10/12/2016 1540   PHURINE 5.0 10/12/2016 1540   GLUCOSEU >=500 (A) 10/12/2016 1540   HGBUR SMALL (A) 10/12/2016 1540   BILIRUBINUR MODERATE (A) 10/12/2016 1540   KETONESUR NEGATIVE 10/12/2016 1540   PROTEINUR 30 (A) 10/12/2016 1540   NITRITE NEGATIVE 10/12/2016 1540   LEUKOCYTESUR NEGATIVE 10/12/2016 1540   Sepsis  Labs: @LABRCNTIP (procalcitonin:4,lacticidven:4)  ) Recent Results (from the past 240 hour(s))  Culture, blood (x 2)     Status: None   Collection Time: 10/12/16  4:12 PM  Result Value Ref Range Status   Specimen Description PORTA CATH RIGHT  Final   Special Requests BOTTLES DRAWN AEROBIC AND ANAEROBIC 5CC  Final   Culture   Final    NO GROWTH 5 DAYS Performed at [facility]    Report Status 10/17/2016 FINAL  Final  Respiratory Panel by PCR     Status: None   Collection Time: 10/12/16  7:00 PM  Result Value Ref Range Status   Adenovirus NOT DETECTED NOT DETECTED Final   Coronavirus 229E NOT DETECTED NOT DETECTED Final   Coronavirus HKU1 NOT DETECTED NOT DETECTED Final   Coronavirus NL63 NOT DETECTED NOT DETECTED Final   Coronavirus OC43 NOT DETECTED NOT DETECTED Final   Metapneumovirus NOT DETECTED NOT DETECTED Final   Rhinovirus / Enterovirus NOT DETECTED NOT DETECTED Final   Influenza A NOT DETECTED NOT DETECTED Final   Influenza B NOT DETECTED NOT DETECTED Final   Parainfluenza Virus 1 NOT DETECTED NOT DETECTED Final   Parainfluenza Virus 2 NOT DETECTED NOT DETECTED Final   Parainfluenza Virus 3 NOT DETECTED NOT DETECTED Final   Parainfluenza Virus 4 NOT DETECTED NOT DETECTED Final   Respiratory Syncytial Virus NOT DETECTED NOT DETECTED Final   Bordetella pertussis NOT DETECTED NOT DETECTED Final   Chlamydophila pneumoniae NOT DETECTED NOT DETECTED Final   Mycoplasma pneumoniae NOT DETECTED NOT DETECTED Final    Comment: Performed at [facility]  Culture, blood (x 2)     Status: None   Collection Time: 10/12/16  8:17 PM  Result Value Ref Range Status   Specimen Description RIGHT ANTECUBITAL  Final   Special Requests BOTTLES DRAWN AEROBIC AND ANAEROBIC 5CC  Final   Culture   Final    NO GROWTH 5 DAYS Performed at [facility]    Report Status 10/17/2016 FINAL  Final     Radiology Studies: No results found.  Scheduled Meds: . insulin aspart   0-9 Units Subcutaneous TID WC  . insulin detemir  30 Units Subcutaneous Daily  . lipase/protease/amylase  36,000 Units Oral TID AC  . loratadine  10 mg Oral Daily  . multivitamin with minerals  1 tablet Oral Daily  . pantoprazole  40 mg Oral Daily  . piperacillin-tazobactam (ZOSYN)  IV  3.375 g Intravenous Q8H  . polyethylene glycol  17 g Oral Daily  . potassium chloride  30 mEq Oral Once  . sodium chloride flush  3 mL Intravenous Q12H   Continuous Infusions:    LOS: 7 days   Time spent: 25 minutes.  Barton Dubois, MD  [facility] Pager [phone]  If 7PM-7AM, please contact night-coverage [email or web address] Password [email or web address] 10/19/2016, 4:17 PM

## 2016-10-26 NOTE — Anesthesia Procedure Notes (Signed)
Procedure Name: Intubation Date/Time: 10/26/2016 2:02 PM Performed by: Suzy Bouchard Pre-anesthesia Checklist: Patient identified, Emergency Drugs available, Suction available, Patient being monitored and Timeout performed Patient Re-evaluated:Patient Re-evaluated prior to inductionOxygen Delivery Method: Circle system utilized Preoxygenation: Pre-oxygenation with 100% oxygen Intubation Type: IV induction Laryngoscope Size: Miller and 2 Grade View: Grade I Tube type: Oral Tube size: 7.5 mm Number of attempts: 1 Airway Equipment and Method: Stylet and LTA kit utilized Placement Confirmation: ETT inserted through vocal cords under direct vision,  positive ETCO2 and breath sounds checked- equal and bilateral Secured at: 22 cm Tube secured with: Tape Dental Injury: Teeth and Oropharynx as per pre-operative assessment

## 2016-10-26 NOTE — Interval H&P Note (Signed)
History and Physical Interval Note:  10/26/2016 1:52 PM  Jacob Hardin  has presented today for surgery, with the diagnosis of obstruction of bile duct  The various methods of treatment have been discussed with the patient and family. After consideration of risks, benefits and other options for treatment, the patient has consented to  Procedure(s): ENDOSCOPIC RETROGRADE CHOLANGIOPANCREATOGRAPHY (ERCP) (N/A) as a surgical intervention .  The patient's history has been reviewed, patient examined, no change in status, stable for surgery.  I have reviewed the patient's chart and labs.  Questions were answered to the patient's satisfaction.     Sonnie Bias D

## 2016-10-26 NOTE — Anesthesia Postprocedure Evaluation (Signed)
Anesthesia Post Note  Patient: Jacob Hardin  Procedure(s) Performed: Procedure(s) (LRB): ENDOSCOPIC RETROGRADE CHOLANGIOPANCREATOGRAPHY (ERCP) (N/A)  Patient location during evaluation: Endoscopy Anesthesia Type: General Level of consciousness: awake, sedated and lethargic Pain management: pain level controlled Vital Signs Assessment: post-procedure vital signs reviewed and stable Respiratory status: spontaneous breathing, nonlabored ventilation, respiratory function stable and patient connected to nasal cannula oxygen Cardiovascular status: blood pressure returned to baseline and stable Postop Assessment: no signs of nausea or vomiting Anesthetic complications: no       Last Vitals:  Vitals:   10/26/16 1610 10/26/16 1620  BP: (!) 185/83 (!) 195/84  Pulse:    Resp:    Temp:      Last Pain:  Vitals:   10/26/16 1524  TempSrc: Oral                 Emanuele Mcwhirter,JAMES TERRILL

## 2016-10-26 NOTE — Transfer of Care (Signed)
Immediate Anesthesia Transfer of Care Note  Patient: Jacob Hardin  Procedure(s) Performed: Procedure(s): ENDOSCOPIC RETROGRADE CHOLANGIOPANCREATOGRAPHY (ERCP) (N/A)  Patient Location: Endoscopy Unit  Anesthesia Type:General  Level of Consciousness: awake, alert  and oriented  Airway & Oxygen Therapy: Patient Spontanous Breathing and Patient connected to nasal cannula oxygen  Post-op Assessment: Report given to RN, Post -op Vital signs reviewed and stable and Patient moving all extremities X 4  Post vital signs: Reviewed and stable  Last Vitals:  Vitals:   10/26/16 1143 10/26/16 1524  BP:  (!) 163/102  Pulse:  74  Resp:  12  Temp: 36.7 C 36.7 C    Last Pain:  Vitals:   10/26/16 1524  TempSrc: Oral         Complications: No apparent anesthesia complications

## 2016-10-26 NOTE — Anesthesia Preprocedure Evaluation (Signed)
Anesthesia Evaluation  Patient identified by MRN, date of birth, ID band Patient awake  General Assessment Comment:Primary pancreatic adenocarcinoma ([place])  mets to liver and duodenum    Reviewed: Allergy & Precautions, NPO status , Patient's Chart, lab work & pertinent test results  History of Anesthesia Complications (+) PONV and history of anesthetic complications  Airway Mallampati: II  TM Distance: >3 FB Neck ROM: Full    Dental no notable dental hx.    Pulmonary neg pulmonary ROS,    Pulmonary exam normal breath sounds clear to auscultation       Cardiovascular hypertension, Pt. on medications Normal cardiovascular exam Rhythm:Regular Rate:Normal     Neuro/Psych negative neurological ROS  negative psych ROS   GI/Hepatic Neg liver ROS, GERD  ,  Endo/Other  diabetes, Insulin Dependent, Oral Hypoglycemic Agents  Renal/GU negative Renal ROS  negative genitourinary   Musculoskeletal negative musculoskeletal ROS (+)   Abdominal   Peds negative pediatric ROS (+)  Hematology negative hematology ROS (+)   Anesthesia Other Findings   Reproductive/Obstetrics negative OB ROS                             Anesthesia Physical Anesthesia Plan  ASA: III  Anesthesia Plan: General   Post-op Pain Management:    Induction: Intravenous  Airway Management Planned: Oral ETT  Additional Equipment: None  Intra-op Plan:   Post-operative Plan: Extubation in OR  Informed Consent: I have reviewed the patients History and Physical, chart, labs and discussed the procedure including the risks, benefits and alternatives for the proposed anesthesia with the patient or authorized representative who has indicated his/her understanding and acceptance.   Dental advisory given  Plan Discussed with: CRNA and Surgeon  Anesthesia Plan Comments:         Anesthesia Quick Evaluation

## 2016-10-26 NOTE — Op Note (Addendum)
[facility] Patient Name: Jacob Hardin Procedure Date : 10/26/2016 MRN: [id number] Attending MD: Carol Ada , MD Date of Birth: [birth year] CSN: [id number] Age: 71 Admit Type: Outpatient Procedure:                ERCP Indications:              Stent change Providers:                Carol Ada, MD, Carolynn Comment, RN, Alfonso Patten,                            Technician, Ivar Drape CRNA, CRNA Referring MD:              Medicines:                General Anesthesia Complications:            No immediate complications. Estimated Blood Loss:     Estimated blood loss was minimal. Procedure:                Pre-Anesthesia Assessment:                           - Prior to the procedure, a History and Physical                            was performed, and patient medications and                            allergies were reviewed. The patient's tolerance of                            previous anesthesia was also reviewed. The risks                            and benefits of the procedure and the sedation                            options and risks were discussed with the patient.                            All questions were answered, and informed consent                            was obtained. Prior Anticoagulants: The patient has                            taken no previous anticoagulant or antiplatelet                            agents. ASA Grade Assessment: III - A patient with                            severe systemic disease. After reviewing the risks  and benefits, the patient was deemed in                            satisfactory condition to undergo the procedure.                           - Sedation was administered by an anesthesia                            professional. General anesthesia was attained.                           After obtaining informed consent, the scope was                            passed under direct vision.  Throughout the                            procedure, the patient's blood pressure, pulse, and                            oxygen saturations were monitored continuously. The                            [id number]) scope was introduced through                            the mouth, and used to inject contrast into and                            used to inject contrast into the bile duct. The                            [id number]) scope was introduced through the                            mouth, and used to inject contrast into. The ERCP                            was technically difficult and complex. The patient                            tolerated the procedure well. Scope In: Scope Out: Findings:      The biliary tree contained one covered metal stent. This was found to be       visibly occluded. The stent was removed using a rat-toothed forceps. A       10 mm by 6 cm covered metal stent was placed 5 cm into the biliary tree.       Nothing flowed through the stent. The stent was in good position.      An adult EGD scope was used to examine the duodenal stent. The stent was       dilated up to 15 mm x 2 and each time was at 1 minute. The prior IR  covered metallic stent was removed with a rat-toothed forceps. Upon       gross examination the stent was completely occludded with debris. The       duodenoscope was able to be maneuvered through the duodenal stent after       the dilation with the aid of the fluoroscopy. Cannulation along side the       IR Int/Ext stent was achieved with relative ease and secured in the left       intrahepatic ducts. Contrast injection was diffult and a clear outline       of the CBD was not possible. Going by the prior imaging a new 10 x 60 mm       covered metallic stent deployed proximal to the prior IR stent. Using       the proximal outline of the duodenal stent as reference, the new stent       was placed approximately 2 cm above this  level in the CBD. The most       recent imaging noted that the common hepatic duct obstruction was at the       level of the duodenal stent. Initially there was significant difficulty       with stent placement. A second attempt was made in the long position and       the stent was able to be inserted and properly deployed. With the stent       deployment there was no bile drainage. An endoscopic view from the       distal end of the stent was obtained and a balloon was inserted       proximally. An occlusion cholangiogram was performed and there was scat       filling of the intrahepatic ducts. Only a small nondilated branch of the       left htrahepatics appeared with contrast. Impression:               - One stent was exchanged in the biliary tree. Recommendation:           - Patient has a contact number available for                            emergencies. The signs and symptoms of potential                            delayed complications were discussed with the                            patient. Return to normal activities tomorrow.                            Written discharge instructions were provided to the                            patient.                           - I will discuss with IR about my findings. They                            may be able to interrogate his  biliary tract                            further. Procedure Code(s):        --- Professional ---                           [phone], Endoscopic retrograde                            cholangiopancreatography (ERCP); with removal and                            exchange of stent(s), biliary or pancreatic duct,                            including pre- and post-dilation and guide wire                            passage, when performed, including sphincterotomy,                            when performed, each stent exchanged Diagnosis Code(s):        --- Professional ---                           [id number], Encounter for  fitting and adjustment of                            other gastrointestinal appliance and device CPT copyright 2016 [employer]. All rights reserved. The codes documented in this report are preliminary and upon coder review may  be revised to meet current compliance requirements. Carol Ada, MD Carol Ada, MD 10/26/2016 3:35:13 PM This report has been signed electronically. Number of Addenda: 0

## 2016-10-26 NOTE — Discharge Instructions (Signed)
Endoscopic Retrograde Cholangiopancreatography (ERCP), Care After °Refer to this sheet in the next few weeks. These instructions provide you with information on caring for yourself after your procedure. Your health care provider may also give you more specific instructions. Your treatment has been planned according to current medical practices, but problems sometimes occur. Call your health care provider if you have any problems or questions after your procedure.  °WHAT TO EXPECT AFTER THE PROCEDURE  °After your procedure, it is typical to feel:  °· Soreness in your throat.   °· Sick to your stomach (nauseous).   °· Bloated. °· Dizzy.   °· Fatigued. °HOME CARE INSTRUCTIONS °· Have a friend or family member stay with you for the first 24 hours after your procedure. °· Start taking your usual medicines and eating normally as soon as you feel well enough to do so or as directed by your health care provider. °SEEK MEDICAL CARE IF: °· You have abdominal pain.   °· You develop signs of infection, such as:   °¨ Chills.   °¨ Feeling unwell.   °SEEK IMMEDIATE MEDICAL CARE IF: °· You have difficulty swallowing. °· You have worsening throat, chest, or abdominal pain. °· You vomit. °· You have bloody or very black stools. °· You have a fever. °This information is not intended to replace advice given to you by your health care provider. Make sure you discuss any questions you have with your health care provider. °Document Released: 07/29/2013 Document Reviewed: 07/29/2013 °Elsevier Interactive Patient Education © 2017 [employer] ° °

## 2016-10-28 ENCOUNTER — Other Ambulatory Visit: Payer: Self-pay | Admitting: Oncology

## 2016-10-29 ENCOUNTER — Encounter (HOSPITAL_COMMUNITY): Payer: Self-pay | Admitting: Gastroenterology

## 2016-10-31 ENCOUNTER — Ambulatory Visit: Payer: Medicare Other

## 2016-10-31 ENCOUNTER — Ambulatory Visit (HOSPITAL_BASED_OUTPATIENT_CLINIC_OR_DEPARTMENT_OTHER): Payer: Medicare Other | Admitting: Nurse Practitioner

## 2016-10-31 ENCOUNTER — Other Ambulatory Visit (HOSPITAL_BASED_OUTPATIENT_CLINIC_OR_DEPARTMENT_OTHER): Payer: Medicare Other

## 2016-10-31 ENCOUNTER — Telehealth: Payer: Self-pay | Admitting: Oncology

## 2016-10-31 ENCOUNTER — Telehealth: Payer: Self-pay

## 2016-10-31 VITALS — BP 140/73 | HR 67 | Temp 98.1°F | Resp 17 | Wt 152.6 lb

## 2016-10-31 DIAGNOSIS — Z452 Encounter for adjustment and management of vascular access device: Secondary | ICD-10-CM

## 2016-10-31 DIAGNOSIS — G620 Drug-induced polyneuropathy: Secondary | ICD-10-CM

## 2016-10-31 DIAGNOSIS — C799 Secondary malignant neoplasm of unspecified site: Secondary | ICD-10-CM

## 2016-10-31 DIAGNOSIS — E119 Type 2 diabetes mellitus without complications: Secondary | ICD-10-CM

## 2016-10-31 DIAGNOSIS — C250 Malignant neoplasm of head of pancreas: Secondary | ICD-10-CM

## 2016-10-31 DIAGNOSIS — R740 Nonspecific elevation of levels of transaminase and lactic acid dehydrogenase [LDH]: Secondary | ICD-10-CM

## 2016-10-31 DIAGNOSIS — Z95828 Presence of other vascular implants and grafts: Secondary | ICD-10-CM

## 2016-10-31 DIAGNOSIS — K831 Obstruction of bile duct: Secondary | ICD-10-CM

## 2016-10-31 DIAGNOSIS — C787 Secondary malignant neoplasm of liver and intrahepatic bile duct: Secondary | ICD-10-CM | POA: Diagnosis not present

## 2016-10-31 LAB — COMPREHENSIVE METABOLIC PANEL
ALK PHOS: 715 U/L — AB (ref 40–150)
ALT: 72 U/L — ABNORMAL HIGH (ref 0–55)
ANION GAP: 9 meq/L (ref 3–11)
AST: 56 U/L — ABNORMAL HIGH (ref 5–34)
Albumin: 2.9 g/dL — ABNORMAL LOW (ref 3.5–5.0)
BUN: 16.2 mg/dL (ref 7.0–26.0)
CO2: 25 meq/L (ref 22–29)
Calcium: 9.3 mg/dL (ref 8.4–10.4)
Chloride: 100 mEq/L (ref 98–109)
Creatinine: 0.9 mg/dL (ref 0.7–1.3)
EGFR: 89 mL/min/{1.73_m2} — AB (ref >90)
Glucose: 234 mg/dl — ABNORMAL HIGH (ref 70–140)
Potassium: 3.7 mEq/L (ref 3.5–5.1)
Sodium: 135 mEq/L — ABNORMAL LOW (ref 136–145)
Total Bilirubin: 3.22 mg/dL — ABNORMAL HIGH (ref 0.20–1.20)
Total Protein: 6.9 g/dL (ref 6.4–8.3)

## 2016-10-31 LAB — CBC WITH DIFFERENTIAL/PLATELET
BASO%: 0.4 % (ref 0.0–2.0)
BASOS ABS: 0 10*3/uL (ref 0.0–0.1)
EOS ABS: 0.1 10*3/uL (ref 0.0–0.5)
EOS%: 0.7 % (ref 0.0–7.0)
HCT: 31.3 % — ABNORMAL LOW (ref 38.4–49.9)
HGB: 10.3 g/dL — ABNORMAL LOW (ref 13.0–17.1)
LYMPH%: 7.4 % — AB (ref 14.0–49.0)
MCH: 30 pg (ref 27.2–33.4)
MCHC: 32.9 g/dL (ref 32.0–36.0)
MCV: 91.3 fL (ref 79.3–98.0)
MONO#: 0.6 10*3/uL (ref 0.1–0.9)
MONO%: 7.3 % (ref 0.0–14.0)
NEUT#: 7.2 10*3/uL — ABNORMAL HIGH (ref 1.5–6.5)
NEUT%: 84.2 % — AB (ref 39.0–75.0)
NRBC: 0 % (ref 0–0)
PLATELETS: 174 10*3/uL (ref 140–400)
RBC: 3.43 10*6/uL — AB (ref 4.20–5.82)
RDW: 14.7 % — ABNORMAL HIGH (ref 11.0–14.6)
WBC: 8.6 10*3/uL (ref 4.0–10.3)
lymph#: 0.6 10*3/uL — ABNORMAL LOW (ref 0.9–3.3)

## 2016-10-31 MED ORDER — HEPARIN SOD (PORK) LOCK FLUSH 100 UNIT/ML IV SOLN
500.0000 [IU] | Freq: Once | INTRAVENOUS | Status: AC | PRN
  Administered 2016-10-31: 500 [IU] via INTRAVENOUS
  Filled 2016-10-31: qty 5

## 2016-10-31 MED ORDER — SODIUM CHLORIDE 0.9 % IJ SOLN
10.0000 mL | INTRAMUSCULAR | Status: DC | PRN
  Administered 2016-10-31: 10 mL via INTRAVENOUS
  Filled 2016-10-31: qty 10

## 2016-10-31 NOTE — Telephone Encounter (Signed)
GAVE PATIENT AVS REPORT AND APPOINTMENTS FOR January  °

## 2016-10-31 NOTE — Telephone Encounter (Signed)
Called Dr.Hungs office to see what plan was for pt, bilirubin still elevated today at appt. Spoke with Dr. [name] who states he palced the stent last week and doesn't believe there is much more for him to do but spoke with Dr. Annamaria Boots as noted in his notes about possible overlap stent d/t concern of higher obstruction. Informed Dr. Luberta Robertson.appt with IR 2/20

## 2016-10-31 NOTE — Progress Notes (Addendum)
[facility] OFFICE PROGRESS NOTE   Diagnosis:  Pancreas cancer  INTERVAL HISTORY:   Mr. Pleasants returns as scheduled. He was last treated with FOLFIRI on 09/19/2016. Treatment subsequently placed on hold due to elevated liver enzymes. He was hospitalized 10/12/2016 through 10/20/2016 with biliary obstruction/sepsis. He underwent placement of a biliary drain in interventional radiology on 10/16/2016. On 10/26/2016 he underwent an ERCP with biliary stent exchange. There was no bile drainage.  The pruritus has resolved. He is tired. No fever. Appetite is poor. He is losing weight. No nausea or vomiting. He has pain related to the external drain at the right upper abdomen. In the evenings he also notes pain at the left upper abdomen.  Objective:  Vital signs in last 24 hours:  Blood pressure 140/73, pulse 67, temperature 98.1 F (36.7 C), temperature source Oral, resp. rate 17, weight 152 lb 9.6 oz (69.2 kg), SpO2 100 %.    HEENT: No thrush or ulcers. Resp: Lungs clear bilaterally. Cardio: Regular rate and rhythm. GI: Abdomen soft and nontender. No hepatomegaly. No mass. Right upper quadrant drain. Vascular: No leg edema. Port-A-Cath without erythema.  Lab Results:  Lab Results  Component Value Date   WBC 8.6 10/31/2016   HGB 10.3 (L) 10/31/2016   HCT 31.3 (L) 10/31/2016   MCV 91.3 10/31/2016   PLT 174 10/31/2016   NEUTROABS 7.2 (H) 10/31/2016  10/31/2016 total bilirubin 3.22  Imaging:  No results found.  Medications: I have reviewed the patient's current medications.  Assessment/Plan: 1. 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 Upper endoscopy 11/18/2015 confirmed duodenal invasion/obstruction with a biopsy confirming adenocarcinoma ? Placement of a duodenal stent 11/22/2015 ? MRI abdomen 11/15/2015 revealed a pancreas head mass and liver metastases ? Cycle 1 FOLFIRINOX  12/06/2015 ? Cycle 2 FOLFIRINOX 12/21/2015 ? Cycle 3 FOLFIRINOX 01/04/2016 ? CT abdomen/pelvis 01/19/2016 showed improvement in the pancreatic head mass and liver metastases. ? Cycle 4 FOLFIRINOX 02/01/2016 ? Cycle 5 FOLFIRINOX 02/15/2016 ? Cycle 6 FOLFIRINOX 02/29/2016 ? Cycle 7 FOLFIRINOX 03/14/2016 ? Cycle 8 FOLFIRINOX 03/28/2016 ? CT abdomen/pelvis 04/09/2016-improvement in pancreas head mass and liver metastases, no new metastatic disease ? Chemotherapy switch to FOLFIRI every 3 weeks beginning 04/11/2016 ? CT 07/30/2016-improvement in liver metastases, stable pancreas mass  ? FOLFIRI continued on a 3 week schedule ? FOLFIRI 09/19/2016. ? Treatment subsequently placed on hold due to biliary obstruction  2. Diabetes  3. History of Anorexia/weight loss secondary to #1  4. Obstructive jaundice secondary to #1-status post placement of a percutaneous internal/external biliary drain 11/25/2015; biliary drainage catheter capped 12/02/2015; biliary drain internalized 12/13/2015  5. Port-A-Cath placement 11/30/2015 interventional radiology  6. Admission 01/17/2016 with a high fever and rigors-no apparent source for infection upon review of his history and examination, blood cultures positive for gram-positive cocci--viridans streptococcus--completed 2 weeks of IV ceftriaxone; no endocarditis on TTE.  7. Oxaliplatin neuropathy  8. Elevated transaminases, alkaline phosphatase, and bilirubin 06/13/2016.06/19/2016 bilirubin improved to normal range; liver enzymes remain elevated.  9.   Generalized pruritus, no rash 10/10/2016. Likely secondary to hyperbilirubinemia.   10. Hospitalization 10/10/2016 through 10/20/2016 with biliary obstruction/sepsis. Status post placement of a biliary drain in interventional radiology 10/16/2016. Status post ERCP 10/26/2016 with biliary stent exchange. There was no bile drainage.      Disposition: Mr. Detoro appears stable. He  was last treated with FOLFIRI 09/19/2016. Treatment subsequently placed on hold due to elevated liver enzymes. He was hospitalized  10/10/2016 through 10/20/2016 with biliary obstruction/sepsis. An external biliary drain was placed in interventional radiology on 10/16/2016. He underwent an ERCP on 10/26/2016 with biliary stent exchange with no subsequent bile drainage. Dr. Ulyses Amor plan per the ERCP note 10/26/2016 is to consult interventional radiology for further interrogation of the biliary tract.  Mr. Callo understands chemotherapy will need to remain on hold until the biliary obstruction is relieved.  He will return for a follow-up visit on 11/12/2016. He will contact the office in the interim with any problems.  Patient seen with Dr. [name]. 25 minutes were spent face-to-face at today's visit with the majority of that time involved in counseling/coordination of care.    [name] ANP/GNP-BC   10/31/2016  9:13 AM   This was a shared visit with [name]. We reviewed the recent ERCP report. He has persistent biliary obstruction post placement of a biliary stent and external drain. He will see interventional radiology to see if an additional stent will help. Chemotherapy will remain on hold until the hyperbilirubinemia resolves.  [name], M.D.

## 2016-11-01 LAB — CANCER ANTIGEN 19-9: CA 19-9: 489 U/mL — ABNORMAL HIGH (ref 0–35)

## 2016-11-02 ENCOUNTER — Telehealth: Payer: Self-pay | Admitting: *Deleted

## 2016-11-02 NOTE — Telephone Encounter (Signed)
Wife returned call, informed her of CA19-9 result per Dr. Gearldine Shown note below. She voiced understanding.

## 2016-11-02 NOTE — Telephone Encounter (Signed)
-----   Message from Ladell Pier, MD sent at 11/01/2016  5:24 PM EST ----- Please call patient, ca19-9 is higher, may be due to obstruction, will repeat when bilirubin is normal

## 2016-11-06 ENCOUNTER — Other Ambulatory Visit: Payer: Self-pay | Admitting: Family Medicine

## 2016-11-06 ENCOUNTER — Other Ambulatory Visit: Payer: Self-pay | Admitting: Radiology

## 2016-11-07 ENCOUNTER — Ambulatory Visit (HOSPITAL_COMMUNITY)
Admission: RE | Admit: 2016-11-07 | Discharge: 2016-11-07 | Disposition: A | Discharge status: Home / Self Care | Payer: Medicare Other | Source: Ambulatory Visit | Attending: General Surgery | Admitting: General Surgery

## 2016-11-07 ENCOUNTER — Encounter (HOSPITAL_COMMUNITY): Payer: Self-pay

## 2016-11-07 ENCOUNTER — Ambulatory Visit (HOSPITAL_COMMUNITY)
Admission: RE | Admit: 2016-11-07 | Discharge: 2016-11-07 | Disposition: A | Discharge status: Home / Self Care | Payer: Medicare Other | Source: Ambulatory Visit | Attending: Oncology | Admitting: Oncology

## 2016-11-07 DIAGNOSIS — K831 Obstruction of bile duct: Secondary | ICD-10-CM

## 2016-11-07 DIAGNOSIS — C259 Malignant neoplasm of pancreas, unspecified: Secondary | ICD-10-CM | POA: Insufficient documentation

## 2016-11-07 DIAGNOSIS — Z8249 Family history of ischemic heart disease and other diseases of the circulatory system: Secondary | ICD-10-CM | POA: Diagnosis not present

## 2016-11-07 DIAGNOSIS — Z981 Arthrodesis status: Secondary | ICD-10-CM | POA: Insufficient documentation

## 2016-11-07 DIAGNOSIS — E119 Type 2 diabetes mellitus without complications: Secondary | ICD-10-CM | POA: Insufficient documentation

## 2016-11-07 DIAGNOSIS — Z794 Long term (current) use of insulin: Secondary | ICD-10-CM | POA: Diagnosis not present

## 2016-11-07 DIAGNOSIS — Z833 Family history of diabetes mellitus: Secondary | ICD-10-CM | POA: Insufficient documentation

## 2016-11-07 DIAGNOSIS — Z8042 Family history of malignant neoplasm of prostate: Secondary | ICD-10-CM | POA: Diagnosis not present

## 2016-11-07 DIAGNOSIS — I10 Essential (primary) hypertension: Secondary | ICD-10-CM | POA: Diagnosis not present

## 2016-11-07 DIAGNOSIS — L299 Pruritus, unspecified: Secondary | ICD-10-CM | POA: Insufficient documentation

## 2016-11-07 DIAGNOSIS — E785 Hyperlipidemia, unspecified: Secondary | ICD-10-CM | POA: Diagnosis not present

## 2016-11-07 DIAGNOSIS — Z800 Family history of malignant neoplasm of digestive organs: Secondary | ICD-10-CM | POA: Insufficient documentation

## 2016-11-07 DIAGNOSIS — Z801 Family history of malignant neoplasm of trachea, bronchus and lung: Secondary | ICD-10-CM | POA: Insufficient documentation

## 2016-11-07 DIAGNOSIS — C787 Secondary malignant neoplasm of liver and intrahepatic bile duct: Secondary | ICD-10-CM | POA: Insufficient documentation

## 2016-11-07 DIAGNOSIS — K219 Gastro-esophageal reflux disease without esophagitis: Secondary | ICD-10-CM | POA: Insufficient documentation

## 2016-11-07 DIAGNOSIS — Z85828 Personal history of other malignant neoplasm of skin: Secondary | ICD-10-CM | POA: Diagnosis not present

## 2016-11-07 DIAGNOSIS — Z4803 Encounter for change or removal of drains: Secondary | ICD-10-CM | POA: Diagnosis present

## 2016-11-07 DIAGNOSIS — Z808 Family history of malignant neoplasm of other organs or systems: Secondary | ICD-10-CM | POA: Diagnosis not present

## 2016-11-07 DIAGNOSIS — G2581 Restless legs syndrome: Secondary | ICD-10-CM | POA: Diagnosis not present

## 2016-11-07 HISTORY — PX: IR GENERIC HISTORICAL: IMG1180011

## 2016-11-07 HISTORY — DX: Pruritus, unspecified: L29.9

## 2016-11-07 HISTORY — DX: Abnormal levels of other serum enzymes: R74.8

## 2016-11-07 HISTORY — DX: Abnormal weight loss: R63.4

## 2016-11-07 HISTORY — DX: Obstruction of bile duct: K83.1

## 2016-11-07 HISTORY — DX: Unspecified nephritic syndrome with other morphologic changes: N05.8

## 2016-11-07 LAB — COMPREHENSIVE METABOLIC PANEL
ALBUMIN: 3.3 g/dL — AB (ref 3.5–5.0)
ALK PHOS: 693 U/L — AB (ref 38–126)
ALT: 61 U/L (ref 17–63)
ANION GAP: 8 (ref 5–15)
AST: 68 U/L — ABNORMAL HIGH (ref 15–41)
BUN: 16 mg/dL (ref 6–20)
CHLORIDE: 104 mmol/L (ref 101–111)
CO2: 25 mmol/L (ref 22–32)
Calcium: 8.9 mg/dL (ref 8.9–10.3)
Creatinine, Ser: 0.74 mg/dL (ref 0.61–1.24)
GFR calc Af Amer: >60 mL/min (ref >60)
GFR calc non Af Amer: >60 mL/min (ref >60)
GLUCOSE: 136 mg/dL — AB (ref 65–99)
POTASSIUM: 3.8 mmol/L (ref 3.5–5.1)
Sodium: 137 mmol/L (ref 135–145)
Total Bilirubin: 2.2 mg/dL — ABNORMAL HIGH (ref 0.3–1.2)
Total Protein: 6.9 g/dL (ref 6.5–8.1)

## 2016-11-07 LAB — PROTIME-INR
INR: 1.01
Prothrombin Time: 13.3 seconds (ref 11.4–15.2)

## 2016-11-07 LAB — GLUCOSE, CAPILLARY
GLUCOSE-CAPILLARY: 125 mg/dL — AB (ref 65–99)
GLUCOSE-CAPILLARY: 132 mg/dL — AB (ref 65–99)

## 2016-11-07 MED ORDER — PIPERACILLIN-TAZOBACTAM 3.375 G IVPB
3.3750 g | INTRAVENOUS | Status: AC
  Administered 2016-11-07: 3.375 g via INTRAVENOUS
  Filled 2016-11-07: qty 50

## 2016-11-07 MED ORDER — IOPAMIDOL (ISOVUE-300) INJECTION 61%
INTRAVENOUS | Status: AC
  Administered 2016-11-07: 25 mL via INTRAVENOUS
  Filled 2016-11-07: qty 50

## 2016-11-07 MED ORDER — PROMETHAZINE HCL 25 MG/ML IJ SOLN
INTRAMUSCULAR | Status: AC
  Filled 2016-11-07: qty 1

## 2016-11-07 MED ORDER — PROMETHAZINE HCL 25 MG/ML IJ SOLN
INTRAMUSCULAR | Status: AC | PRN
  Administered 2016-11-07: 25 mg via INTRAVENOUS

## 2016-11-07 MED ORDER — NALOXONE HCL 0.4 MG/ML IJ SOLN
INTRAMUSCULAR | Status: AC
  Filled 2016-11-07: qty 1

## 2016-11-07 MED ORDER — FENTANYL CITRATE (PF) 100 MCG/2ML IJ SOLN
INTRAMUSCULAR | Status: AC | PRN
  Administered 2016-11-07: 50 ug via INTRAVENOUS
  Administered 2016-11-07 (×2): 25 ug via INTRAVENOUS
  Administered 2016-11-07: 50 ug via INTRAVENOUS

## 2016-11-07 MED ORDER — SODIUM CHLORIDE 0.9 % IV SOLN
INTRAVENOUS | Status: DC
  Administered 2016-11-07: 10:00:00 via INTRAVENOUS

## 2016-11-07 MED ORDER — IOPAMIDOL (ISOVUE-300) INJECTION 61%
INTRAVENOUS | Status: AC
  Administered 2016-11-07: 40 mL via INTRAVENOUS
  Filled 2016-11-07: qty 50

## 2016-11-07 MED ORDER — IOPAMIDOL (ISOVUE-300) INJECTION 61%
50.0000 mL | Freq: Once | INTRAVENOUS | Status: AC | PRN
  Administered 2016-11-07: 40 mL via INTRAVENOUS

## 2016-11-07 MED ORDER — FENTANYL CITRATE (PF) 100 MCG/2ML IJ SOLN
INTRAMUSCULAR | Status: AC
  Filled 2016-11-07: qty 4

## 2016-11-07 MED ORDER — MIDAZOLAM HCL 2 MG/2ML IJ SOLN
INTRAMUSCULAR | Status: AC
  Filled 2016-11-07: qty 4

## 2016-11-07 MED ORDER — MIDAZOLAM HCL 2 MG/2ML IJ SOLN
INTRAMUSCULAR | Status: AC | PRN
  Administered 2016-11-07 (×2): 1 mg via INTRAVENOUS
  Administered 2016-11-07: 0.5 mg via INTRAVENOUS

## 2016-11-07 MED ORDER — FLUMAZENIL 0.5 MG/5ML IV SOLN
INTRAVENOUS | Status: AC
  Filled 2016-11-07: qty 5

## 2016-11-07 MED ORDER — LIDOCAINE HCL 1 % IJ SOLN
INTRAMUSCULAR | Status: AC | PRN
  Administered 2016-11-07: 10 mL

## 2016-11-07 MED ORDER — IOPAMIDOL (ISOVUE-300) INJECTION 61%
50.0000 mL | Freq: Once | INTRAVENOUS | Status: AC | PRN
  Administered 2016-11-07: 25 mL via INTRAVENOUS

## 2016-11-07 MED ORDER — LIDOCAINE HCL 1 % IJ SOLN
INTRAMUSCULAR | Status: AC
  Filled 2016-11-07: qty 20

## 2016-11-07 MED ORDER — HEPARIN SOD (PORK) LOCK FLUSH 100 UNIT/ML IV SOLN
500.0000 [IU] | INTRAVENOUS | Status: AC | PRN
  Administered 2016-11-07: 500 [IU]
  Filled 2016-11-07: qty 5

## 2016-11-07 NOTE — Discharge Instructions (Signed)
Biliary Drainage Catheter Placement, Care After Refer to this sheet in the next few weeks. These instructions provide you with information on caring for yourself after your procedure. Your health care provider may also give you more specific instructions. Your treatment has been planned according to current medical practices, but problems sometimes occur. Call your health care provider if you have any problems or questions after your procedure. WHAT TO EXPECT AFTER THE PROCEDURE After your procedure, it is typical to have the following:  Pain or soreness at the catheter insertion site.  Drowsiness for several hours after the procedure.  Some bruising at the catheter insertion site.  Drainage into the collection bag on the outside of your body, if you have an external drainage catheter. You might see bloody discharge in the bag for the first day or two. This should turn a yellow-green color soon afterward. HOME CARE INSTRUCTIONS   Do not use machinery, drive, or make legal decisions for 24 hours after your procedure.  Have someone drive you home.  Resume your usual diet. Avoid alcoholic beverages for 24 hours after your procedure.  Rest for the remainder of the day.  Only take over-the-counter or prescription medicines for pain, discomfort, or fever as directed by your health care provider. Do not take aspirin or blood thinners unless directed otherwise. This can make bleeding worse.  Clean the tube insertion site as directed by your health care provider.  Take showers, not baths. Avoid pools and hot tubs. Before showering, cover the area with plastic wrap and tape the edges of the plastic wrap to your skin. This is done to keep your skin dry.  Keep the skin around the insertion site dry. If the area gets wet, dry the skin completely.  Keep all follow-up appointments. SEEK MEDICAL CARE IF:   Your pain gets worse and is not relieved with pain medicines after an initial  improvement.  You have any questions about your tube.  Your skin breaks down around the tube.  You have a fever.  You have chills. SEEK IMMEDIATE MEDICAL CARE IF:   Your redness, soreness, or swelling at the tube insertion site gets worse despite good cleaning.  You have leakage of bile around the tube.  Your tube becomes blocked or clogged.  Your catheter is dislodged or comes out. This information is not intended to replace advice given to you by your health care provider. Make sure you discuss any questions you have with your health care provider. Document Released: 05/22/2004 Document Revised: 10/13/2013 Document Reviewed: 06/15/2013 Elsevier Interactive Patient Education  2017 [place]. Moderate Conscious Sedation, Adult, Care After These instructions provide you with information about caring for yourself after your procedure. Your health care provider may also give you more specific instructions. Your treatment has been planned according to current medical practices, but problems sometimes occur. Call your health care provider if you have any problems or questions after your procedure. What can I expect after the procedure? After your procedure, it is common:  To feel sleepy for several hours.  To feel clumsy and have poor balance for several hours.  To have poor judgment for several hours.  To vomit if you eat too soon. Follow these instructions at home: For at least 24 hours after the procedure:   Do not:  Participate in activities where you could fall or become injured.  Drive.  Use heavy machinery.  Drink alcohol.  Take sleeping pills or medicines that cause drowsiness.  Make important decisions  or sign legal documents.  Take care of children on your own.  Rest. Eating and drinking  Follow the diet recommended by your health care provider.  If you vomit:  Drink water, juice, or soup when you can drink without vomiting.  Make sure you have  little or no nausea before eating solid foods. General instructions  Have a responsible adult stay with you until you are awake and alert.  Take over-the-counter and prescription medicines only as told by your health care provider.  If you smoke, do not smoke without supervision.  Keep all follow-up visits as told by your health care provider. This is important. Contact a health care provider if:  You keep feeling nauseous or you keep vomiting.  You feel light-headed.  You develop a rash.  You have a fever. Get help right away if:  You have trouble breathing. This information is not intended to replace advice given to you by your health care provider. Make sure you discuss any questions you have with your health care provider. Document Released: 07/29/2013 Document Revised: 03/12/2016 Document Reviewed: 01/28/2016 Elsevier Interactive Patient Education  2017 [employer].

## 2016-11-07 NOTE — Sedation Documentation (Signed)
Pt with strong nausea post procedure. No vomiting. Phenergan 25 mg ordered per Dr. Vernard Gambles. 25 mg diluted in 20 cc NS and pushed slowly via running infusion into patient's implanted PAC.  Will continue to monitor. - Soyla Dryer, RN

## 2016-11-07 NOTE — H&P (Signed)
Chief Complaint: Patient was seen in consultation today for cholangiogram and possible manipulation of bilary drain at the request of Dr. Carol Ada  Referring Physician(s): Dr. Carol Ada  Supervising Physician: Arne Cleveland  Patient Status: [facility] - Out-pt  History of Present Illness: Jacob Hardin is a 71 y.o. male with pancreatic cancer and obstructive jaundice. He had an internal/external bilary drain placed on 10/16/16. The pt had an existing CBD stent which was exchanged last week by Dr. [name]. There is still concern he may be obstructed above the stent, but his TBili has been trending down and he feels much better. He is scheduled today for cholangiogram and possible exchange/upsize of I/E drain vs conversion to internal stent. Chart, PMHx, meds, allergies reviewed. Has been NPO this am. Wife at bedside.   Past Medical History:  Diagnosis Date  . Elevated liver enzymes   . GERD (gastroesophageal reflux disease)   . Hyperlipidemia   . Hypertension    recently weight lost-no meds now-running low.  . Obstructive jaundice   . Oxalate nephropathy   . PONV (postoperative nausea and vomiting)   . Primary pancreatic adenocarcinoma (HCC)    mets to liver and duodenum  . Pruritus   . Restless legs   . Skin cancer    "burned off my face & head"  . Type II diabetes mellitus ([place])   . Weight loss     Past Surgical History:  Procedure Laterality Date  . ANTERIOR CERVICAL DECOMP/DISCECTOMY FUSION  1994   cervical-bone graft  . BACK SURGERY    . COLONOSCOPY    . DUODENAL STENT PLACEMENT N/A 11/22/2015   Procedure: DUODENAL STENT PLACEMENT;  Surgeon: Carol Ada, MD;  Location: [place];  Service: Endoscopy;  Laterality: N/A;  . ERCP N/A 10/26/2016   Procedure: ENDOSCOPIC RETROGRADE CHOLANGIOPANCREATOGRAPHY (ERCP);  Surgeon: Carol Ada, MD;  Location: [facility] ENDOSCOPY;  Service: Endoscopy;  Laterality: N/A;  . ESOPHAGOGASTRODUODENOSCOPY N/A 11/18/2015   Procedure:  ESOPHAGOGASTRODUODENOSCOPY (EGD);  Surgeon: Carol Ada, MD;  Location: [facility] ENDOSCOPY;  Service: Endoscopy;  Laterality: N/A;  . ESOPHAGOGASTRODUODENOSCOPY N/A 11/22/2015   Procedure: ESOPHAGOGASTRODUODENOSCOPY (EGD);  Surgeon: Carol Ada, MD;  Location: [facility] ENDOSCOPY;  Service: Endoscopy;  Laterality: N/A;  Uncovered duodenal stent placement.  Fluoroscopy required.  . ESOPHAGOGASTRODUODENOSCOPY N/A 10/14/2016   Procedure: ESOPHAGOGASTRODUODENOSCOPY (EGD);  Surgeon: Carol Ada, MD;  Location: [facility] ENDOSCOPY;  Service: Endoscopy;  Laterality: N/A;  . EUS  11/18/2015   Procedure: UPPER ENDOSCOPIC ULTRASOUND (EUS) LINEAR;  Surgeon: Carol Ada, MD;  Location: [place];  Service: Endoscopy;;  . INGUINAL HERNIA REPAIR Left   . IR GENERIC HISTORICAL  10/16/2016   IR BILIARY DRAIN PLACEMENT WITH CHOLANGIOGRAM 10/16/2016 Marybelle Killings, MD [facility]-INTERV RAD  . SHOULDER ARTHROSCOPY W/ ROTATOR CUFF REPAIR Left 2014  . TONSILLECTOMY    . WISDOM TOOTH EXTRACTION      Allergies: Codeine  Medications: Prior to Admission medications   Medication Sig Start Date End Date Taking? Authorizing Provider  acetaminophen (TYLENOL) 500 MG tablet Take 1,000 mg by mouth every 6 (six) hours as needed.   Yes Historical Provider, MD  BD PEN NEEDLE NANO U/F 32G X 4 MM MISC USE TO INJECT INSULIN TWICE A DAY 11/06/16  Yes [name], MD  esomeprazole (NEXIUM) 40 MG capsule Take 40 mg by mouth daily.   Yes Historical Provider, MD  hydrOXYzine (ATARAX/VISTARIL) 25 MG tablet Take 1 tablet (25 mg total) by mouth every 8 (eight) hours as needed for itching. 10/20/16  Yes Barton Dubois, MD  insulin aspart (NOVOLOG FLEXPEN) 100 UNIT/ML FlexPen Inject 4 Units into the skin daily before supper.   Yes Historical Provider, MD  Insulin Detemir (LEVEMIR FLEXTOUCH) 100 UNIT/ML Pen Inject 40 Units into the skin daily.   Yes Historical Provider, MD  lidocaine-prilocaine (EMLA) cream Apply 1 application topically as needed (prior to  accessing port).   Yes Historical Provider, MD  lipase/protease/amylase (CREON) 36000 UNITS CPEP capsule Take 1 capsule (36,000 Units total) by mouth 3 (three) times daily before meals. 08/22/16  Yes Ladell Pier, MD  loratadine (CLARITIN) 10 MG tablet Take 10 mg by mouth daily.    Yes Historical Provider, MD  Multiple Vitamin (MULTIVITAMIN WITH MINERALS) TABS tablet Take 1 tablet by mouth daily.   Yes Historical Provider, MD  baclofen (LIORESAL) 10 MG tablet Take 5 mg by mouth 3 (three) times daily as needed (for hiccups).    Historical Provider, MD  loperamide (IMODIUM) 2 MG capsule Take 2 mg by mouth as needed for diarrhea or loose stools.    Historical Provider, MD  ondansetron (ZOFRAN) 8 MG tablet Take 8 mg by mouth every 6 (six) hours as needed for nausea or vomiting.     Historical Provider, MD  polyethylene glycol (MIRALAX / GLYCOLAX) packet Take 17 g by mouth daily as needed for mild constipation.     Historical Provider, MD  prochlorperazine (COMPAZINE) 10 MG tablet Take 1 tablet (10 mg total) by mouth every 6 (six) hours as needed for nausea or vomiting. 12/02/15   Ladell Pier, MD     Family History  Problem Relation Age of Onset  . Diabetes Mother   . Hypertension Mother   . Cancer Father     lung 25  . Hypertension Father   . Cancer Brother     pancreatic   . Diabetes Brother   . Cancer Paternal Grandfather     colon  . Cancer Paternal Uncle     prostate    Social History   Social History  . Marital status: Married    Spouse name: Butch Penny  . Number of children: 0  . Years of education: N/A   Occupational History  . retired     Pharmacist, hospital   Social History Main Topics  . Smoking status: Never Smoker  . Smokeless tobacco: Never Used  . Alcohol use No  . Drug use: No  . Sexual activity: Yes    Birth control/ protection: None     Comment: married to [place].  Retired Corporate investment banker   Other Topics Concern  . None   Social History Narrative    Married, wife Butch Penny   No children   Retired Art therapist     Review of Systems: A 12 point ROS discussed and pertinent positives are indicated in the HPI above.  All other systems are negative.  Review of Systems  Vital Signs: BP (!) 141/81 (BP Location: Right Arm)   Pulse 71   Temp 98.1 F (36.7 C) (Oral)   Resp 18   Ht 6' (1.829 m)   Wt 148 lb (67.1 kg)   SpO2 99%   BMI 20.07 kg/m   Physical Exam  Constitutional: He is oriented to person, place, and time. He appears well-developed. No distress.  HENT:  Head: Normocephalic.  Mouth/Throat: Oropharynx is clear and moist.  Neck: Normal range of motion. No tracheal deviation present.  Cardiovascular: Normal rate, regular rhythm and normal heart sounds.  Pulmonary/Chest: Effort normal and breath sounds normal. No respiratory distress.  Abdominal: Soft.  RUQ biliary drain intact, site clean. Golden bile with some debris/sludge in bag.  Neurological: He is alert and oriented to person, place, and time.  Skin: Skin is warm and dry.  Psychiatric: He has a normal mood and affect.    Mallampati Score:  MD Evaluation Airway: WNL Heart: WNL Abdomen: WNL Chest/ Lungs: WNL ASA  Classification: 3 Mallampati/Airway Score: One  Imaging: Dg Chest 2 View  Result Date: 10/12/2016 CLINICAL DATA:  Fever, chills, pancreatic cancer EXAM: CHEST  2 VIEW COMPARISON:  01/09/2016 FINDINGS: Lungs are clear.  No pleural effusion or pneumothorax. The heart is normal in size. Right chest port terminates in the lower SVC. IMPRESSION: No evidence of acute cardiopulmonary disease. Electronically Signed   By: Julian Hy M.D.   On: 10/12/2016 19:53   US Abdomen Complete  Result Date: 10/11/2016 CLINICAL DATA:  History of pancreatic head carcinoma with biliary stent present EXAM: ABDOMEN ULTRASOUND COMPLETE COMPARISON:  CT abdomen and pelvis July 30, 2016 FINDINGS: Gallbladder: Within the gallbladder, there are multiple echogenic foci  which move but do not shadow. Gallbladder wall is mildly thickened and subtly edematous. There is no pericholecystic fluid. Largest individual echogenic focus measures 9 mm in length. The patient is not focally tender over the gallbladder during this examination. Common bile duct: Diameter: 11 mm, dilated. Areas seen in the intrahepatic ducts as well as in the common bile duct. Stent not well seen, likely due to surrounding air. Liver: The liver has an inhomogeneous appearance with several nodular appearing areas throughout the liver ranging in size from 1-2 cm. Assessment of the liver is somewhat less than optimal due to air within the biliary ducts stool system. IVC: No abnormality visualized. Pancreas: There is a mass in the head of the pancreas which is somewhat hypoechoic measuring 1.9 x 1.6 x 1.2 cm. Note that portions of pancreas are obscured by gas. Spleen: Size and appearance within normal limits. Right Kidney: Length: 12.0 cm. Echogenicity within normal limits. No mass or hydronephrosis visualized. Left Kidney: Length: 10.0 cm. Echogenicity within normal limits. No mass or hydronephrosis visualized. Abdominal aorta: No aneurysm visualized. Note that gas obscures portions of the mid the distal aorta. Other findings: No evidence of ascites. IMPRESSION: Gallbladder contains sludge with probable nonshadowing gallstones. Gallbladder wall is thickened and subtly edematous. Suspect a degree of acute cholecystitis. Biliary ducts are dilated and contain air. Liver shows abnormal echogenicity with nodular opacities throughout the liver, concerning for metastatic disease superimposed on hepatic steatosis. Small mass in head of pancreas. Note that portions of pancreas not visualized. Portions of aorta not visualized. Visualized portions of aorta appear normal. Given this constellation of findings, it may be prudent to consider correlation with CT or MR of the abdomen, ideally with oral and intravenous contrast, to  further evaluate. In particular, contrast-enhanced CT or MR could be helpful to assess for degree of potential hepatic metastatic disease. These results will be called to the ordering clinician or representative by the Radiologist Assistant, and communication documented in the PACS or zVision Dashboard. Electronically Signed   By: Lowella Grip III M.D.   On: 10/11/2016 08:53   Ct Abdomen Pelvis W Contrast  Result Date: 10/13/2016 CLINICAL DATA:  History of pancreatic carcinoma with fever and chills EXAM: CT ABDOMEN AND PELVIS WITH CONTRAST TECHNIQUE: Multidetector CT imaging of the abdomen and pelvis was performed using the standard protocol following bolus administration of intravenous  contrast. CONTRAST:  11mL ISOVUE-300 IOPAMIDOL (ISOVUE-300) INJECTION 61%, 35mL ISOVUE-300 IOPAMIDOL (ISOVUE-300) INJECTION 61% COMPARISON:  07/30/2016, 10/11/2016 FINDINGS: Lower chest: No acute abnormality. Hepatobiliary: Scattered hypodensities are again identified throughout the liver consistent with metastatic disease. The overall appearance is stable. Some differential in the enhancement of the liver is noted related to the timing of the contrast bolus. No significant biliary ductal dilatation is seen. The gallbladder is well distended. Common bile duct stent is noted in satisfactory position. Cement soft tissue density is noted within which may represent debris. Pancreas: Fullness in the pancreatic head is again noted consistent with the patient's given clinical history of pancreatic carcinoma. Common bile duct stent courses through this area. Spleen: Normal in size without focal abnormality. Adrenals/Urinary Tract: Adrenal glands are unremarkable. Kidneys are normal, without renal calculi, focal lesion, or hydronephrosis. Bladder is unremarkable. Stomach/Bowel: Stent is noted within the duodenum proximally which appears patent. The appendix is within normal limits. Scattered diverticular change of the colon is  noted without diverticulitis. No obstructive changes are seen. The CT Um and proximal portion of the ascending colon are predominately decompressed with some mild pericolonic inflammatory changes. This may represent some focal colitis. Vascular/Lymphatic: Aortic atherosclerosis. No enlarged abdominal or pelvic lymph nodes. Reproductive: Prostate is unremarkable. Other: Minimal ascites is noted along the liver. Musculoskeletal: No acute or significant osseous findings. IMPRESSION: Changes consistent with the known history of pancreatic head mass with liver metastatic disease. Duodenal and common bile duct stents are noted in place. Changes suggestive of focal colitis within the right colon Electronically Signed   By: Inez Catalina M.D.   On: 10/13/2016 17:25   Dg Ercp  Result Date: 10/26/2016 CLINICAL DATA:  Primary pancreatic adenocarcinoma and biliary obstruction. EXAM: ERCP TECHNIQUE: Multiple spot images obtained with the fluoroscopic device and submitted for interpretation post-procedure. FLUOROSCOPY TIME:  Fluoroscopy Time:  2 minutes and 37 seconds Number of Acquired Spot Images: 4 COMPARISON:  10/16/2016 and 10/13/2016 FINDINGS: The metallic biliary stent has been removed. Patient continues to have a duodenal stent. Again noted is a right-sided internal/external biliary drain. Common bile duct was cannulated and a wire was advanced into the intrahepatic bile ducts. A new metallic biliary stent was placed in the common bile duct. Contrast demonstrates patency of the new biliary stent. IMPRESSION: Placement of a new metallic biliary stent. These images were submitted for radiologic interpretation only. Please see the procedural report for the amount of contrast and the fluoroscopy time utilized. Electronically Signed   By: Markus Daft M.D.   On: 10/26/2016 15:55   Ir Biliary Drain Placement With Cholangiogram  Result Date: 10/16/2016 INDICATION: Biliary obstruction.  Pancreatic cancer. EXAM: IR BILIARY  DRAIN PLACEMENT W/ CHOLANGIOGRAM MEDICATIONS: Patient is on antibiotics; The antibiotic was administered within an appropriate time frame prior to the initiation of the procedure. ANESTHESIA/SEDATION: Moderate (conscious) sedation was employed during this procedure. A total of Versed 4 mg and Fentanyl 250 mcg was administered intravenously. Moderate Sedation Time: 49 minutes. The patient's level of consciousness and vital signs were monitored continuously by radiology nursing throughout the procedure under my direct supervision. FLUOROSCOPY TIME:  Fluoroscopy Time: 18 minutes 48 seconds (556 mGy). COMPLICATIONS: None immediate. PROCEDURE: Informed written consent was obtained from the patient after a thorough discussion of the procedural risks, benefits and alternatives. All questions were addressed. Maximal Sterile Barrier Technique was utilized including caps, mask, sterile gowns, sterile gloves, sterile drape, hand hygiene and skin antiseptic. A timeout was performed prior to the initiation  of the procedure. The right flank was prepped and draped in a sterile fashion. 1% lidocaine was utilized for local anesthesia. Under sonographic guidance, a Chiba needle was advanced into the common hepatic duct. Contrast was injected opacifying the biliary system. A 21 gauge needle was then utilized to puncture a peripheral right hepatic duct under fluoroscopic guidance. It was removed over a 018 wire which was up sized to a 3 J. A 5 French Kumpe catheter was advanced over the 3 J. The catheter was then advanced over a glidewire into the duodenum across the common hepatic duct obstruction. The Kumpe be catheter was then removed over an Amplatz wire. A 10 [place] biliary drain was then advanced over the Amplatz wire. The tip was coiled in the duodenum. It was string fixed. Contrast was injected. FINDINGS: The initial cholangiogram confirms common hepatic duct obstruction above the existing stent. Subsequent images demonstrate  access into the biliary system via a peripheral right hepatic duct. The final image demonstrates a right internal external biliary drain with its tip coiled in the duodenum. IMPRESSION: Initial cholangiography confirms biliary obstruction in the common hepatic duct above the extent of the stent. A right internal external biliary drain was successful with its tip coiled in the duodenum. The patient will return for follow-up cholangiogram and possible stent placement. Electronically Signed   By: Marybelle Killings M.D.   On: 10/16/2016 16:52    Labs:  CBC:  Recent Labs  10/16/16 0409 10/17/16 0520 10/18/16 0335 10/31/16 0754  WBC 4.2 6.6 6.3 8.6  HGB 10.0* 10.5* 9.2* 10.3*  HCT 28.3* 30.5* 27.0* 31.3*  PLT 126* 170 147* 174    COAGS:  Recent Labs  11/25/15 0753 11/30/15 1130 12/13/15 0750 10/12/16 2021  INR 1.07 1.10 1.08 0.98  APTT 27 28 26 30     BMP:  Recent Labs  10/17/16 0520 10/18/16 0335 10/19/16 0400 10/20/16 0910 10/31/16 0754  NA 135 139 137 133* 135*  K 3.6 3.6 3.5 3.5 3.7  CL 99* 102 101 99*  --   CO2 27 30 29 26 25   GLUCOSE 212* 125* 163* 207* 234*  BUN 17 13 13 15  16.2  CALCIUM 8.6* 8.6* 8.5* 8.6* 9.3  CREATININE 0.69 0.62 0.77 0.73 0.9  GFRNONAA >60 >60 >60 >60  --   GFRAA >60 >60 >60 >60  --     LIVER FUNCTION TESTS:  Recent Labs  10/19/16 0400 10/20/16 0910 10/26/16 1625 10/31/16 0754  BILITOT 4.9* 5.0* 3.8* 3.22*  AST 49* 64* 65* 56*  ALT 85* 90* 66* 72*  ALKPHOS 545* 703* 518* 715*  PROT 6.0* 6.8 6.7 6.9  ALBUMIN 2.7* 3.1* 3.0* 2.9*    TUMOR MARKERS:  Recent Labs  11/10/15 1518 01/19/16 0459  AFPTM 1.8  --   CA199  --  481*    Assessment and Plan: Pancreatic cancer with obstructive jaundice Plan for cholangiogram today with possible exchange/upsize of drain vs internalization to stent. Labs pending Risks and Benefits discussed with the patient including, but not limited to bleeding, infection which may lead to sepsis or even  death and damage to adjacent structures. All of the patient's questions were answered, patient is agreeable to proceed. Consent signed and in chart.    Thank you for this interesting consult.  I greatly enjoyed meeting Jacob Hardin and look forward to participating in their care.  A copy of this report was sent to the requesting provider on this date.  Electronically Signed: Ascencion Dike 11/07/2016,  10:25 AM   I spent a total of 20 minutes in face to face in clinical consultation, greater than 50% of which was counseling/coordinating care for biliary drain management/cholangiogram

## 2016-11-07 NOTE — Procedures (Signed)
Cholangiogram Exchange int/ext biliary drain for [id number] No complication No blood loss. See complete dictation in [facility].

## 2016-11-09 ENCOUNTER — Other Ambulatory Visit: Payer: Self-pay | Admitting: Family Medicine

## 2016-11-12 ENCOUNTER — Ambulatory Visit: Payer: Medicare Other

## 2016-11-12 ENCOUNTER — Telehealth: Payer: Self-pay | Admitting: Oncology

## 2016-11-12 ENCOUNTER — Encounter (HOSPITAL_COMMUNITY): Payer: Self-pay | Admitting: *Deleted

## 2016-11-12 ENCOUNTER — Other Ambulatory Visit (HOSPITAL_BASED_OUTPATIENT_CLINIC_OR_DEPARTMENT_OTHER): Payer: Medicare Other

## 2016-11-12 ENCOUNTER — Ambulatory Visit (HOSPITAL_BASED_OUTPATIENT_CLINIC_OR_DEPARTMENT_OTHER): Payer: Medicare Other | Admitting: Nurse Practitioner

## 2016-11-12 ENCOUNTER — Ambulatory Visit (HOSPITAL_COMMUNITY)
Admission: RE | Admit: 2016-11-12 | Discharge: 2016-11-12 | Disposition: A | Discharge status: Home / Self Care | Payer: Medicare Other | Source: Ambulatory Visit | Attending: Interventional Radiology | Admitting: Interventional Radiology

## 2016-11-12 ENCOUNTER — Other Ambulatory Visit (HOSPITAL_COMMUNITY): Payer: Self-pay | Admitting: Interventional Radiology

## 2016-11-12 VITALS — BP 182/83 | HR 59 | Temp 97.8°F | Resp 18 | Ht 72.0 in | Wt 152.2 lb

## 2016-11-12 DIAGNOSIS — C787 Secondary malignant neoplasm of liver and intrahepatic bile duct: Secondary | ICD-10-CM

## 2016-11-12 DIAGNOSIS — E119 Type 2 diabetes mellitus without complications: Secondary | ICD-10-CM

## 2016-11-12 DIAGNOSIS — C259 Malignant neoplasm of pancreas, unspecified: Secondary | ICD-10-CM

## 2016-11-12 DIAGNOSIS — C799 Secondary malignant neoplasm of unspecified site: Secondary | ICD-10-CM

## 2016-11-12 DIAGNOSIS — Z95828 Presence of other vascular implants and grafts: Secondary | ICD-10-CM

## 2016-11-12 DIAGNOSIS — C250 Malignant neoplasm of head of pancreas: Secondary | ICD-10-CM

## 2016-11-12 DIAGNOSIS — G620 Drug-induced polyneuropathy: Secondary | ICD-10-CM

## 2016-11-12 DIAGNOSIS — R740 Nonspecific elevation of levels of transaminase and lactic acid dehydrogenase [LDH]: Secondary | ICD-10-CM

## 2016-11-12 DIAGNOSIS — Z452 Encounter for adjustment and management of vascular access device: Secondary | ICD-10-CM

## 2016-11-12 HISTORY — PX: IR GENERIC HISTORICAL: IMG1180011

## 2016-11-12 LAB — COMPREHENSIVE METABOLIC PANEL
ALT: 85 U/L — ABNORMAL HIGH (ref 0–55)
AST: 79 U/L — AB (ref 5–34)
Albumin: 2.8 g/dL — ABNORMAL LOW (ref 3.5–5.0)
Alkaline Phosphatase: 1040 U/L — ABNORMAL HIGH (ref 40–150)
Anion Gap: 8 mEq/L (ref 3–11)
BUN: 14.7 mg/dL (ref 7.0–26.0)
CALCIUM: 9.4 mg/dL (ref 8.4–10.4)
CHLORIDE: 103 meq/L (ref 98–109)
CO2: 26 meq/L (ref 22–29)
Creatinine: 0.9 mg/dL (ref 0.7–1.3)
EGFR: 86 mL/min/{1.73_m2} — ABNORMAL LOW (ref >90)
Glucose: 260 mg/dl — ABNORMAL HIGH (ref 70–140)
POTASSIUM: 4 meq/L (ref 3.5–5.1)
SODIUM: 137 meq/L (ref 136–145)
Total Bilirubin: 1.69 mg/dL — ABNORMAL HIGH (ref 0.20–1.20)
Total Protein: 6.9 g/dL (ref 6.4–8.3)

## 2016-11-12 LAB — CBC WITH DIFFERENTIAL/PLATELET
BASO%: 0.7 % (ref 0.0–2.0)
BASOS ABS: 0 10*3/uL (ref 0.0–0.1)
EOS%: 5.4 % (ref 0.0–7.0)
Eosinophils Absolute: 0.3 10*3/uL (ref 0.0–0.5)
HEMATOCRIT: 30.7 % — AB (ref 38.4–49.9)
HGB: 10.3 g/dL — ABNORMAL LOW (ref 13.0–17.1)
LYMPH%: 13.5 % — AB (ref 14.0–49.0)
MCH: 30.5 pg (ref 27.2–33.4)
MCHC: 33.6 g/dL (ref 32.0–36.0)
MCV: 90.8 fL (ref 79.3–98.0)
MONO#: 0.3 10*3/uL (ref 0.1–0.9)
MONO%: 7.2 % (ref 0.0–14.0)
NEUT#: 3.5 10*3/uL (ref 1.5–6.5)
NEUT%: 73.2 % (ref 39.0–75.0)
Platelets: 182 10*3/uL (ref 140–400)
RBC: 3.38 10*6/uL — AB (ref 4.20–5.82)
RDW: 15 % — ABNORMAL HIGH (ref 11.0–14.6)
WBC: 4.8 10*3/uL (ref 4.0–10.3)
lymph#: 0.7 10*3/uL — ABNORMAL LOW (ref 0.9–3.3)

## 2016-11-12 MED ORDER — HEPARIN SOD (PORK) LOCK FLUSH 100 UNIT/ML IV SOLN
500.0000 [IU] | Freq: Once | INTRAVENOUS | Status: AC | PRN
  Administered 2016-11-12: 500 [IU] via INTRAVENOUS
  Filled 2016-11-12: qty 5

## 2016-11-12 MED ORDER — SODIUM CHLORIDE 0.9 % IJ SOLN
10.0000 mL | INTRAMUSCULAR | Status: DC | PRN
  Administered 2016-11-12: 10 mL via INTRAVENOUS
  Filled 2016-11-12: qty 10

## 2016-11-12 NOTE — Patient Instructions (Signed)
Implanted Port Insertion, Care After Refer to this sheet in the next few weeks. These instructions provide you with information on caring for yourself after your procedure. Your health care provider may also give you more specific instructions. Your treatment has been planned according to current medical practices, but problems sometimes occur. Call your health care provider if you have any problems or questions after your procedure. WHAT TO EXPECT AFTER THE PROCEDURE After your procedure, it is typical to have the following:   Discomfort at the port insertion site. Ice packs to the area will help.  Bruising on the skin over the port. This will subside in 3-4 days. HOME CARE INSTRUCTIONS  After your port is placed, you will get a manufacturer's information card. The card has information about your port. Keep this card with you at all times.   Know what kind of port you have. There are many types of ports available.   Wear a medical alert bracelet in case of an emergency. This can help alert health care workers that you have a port.   The port can stay in for as long as your health care provider believes it is necessary.   A home health care nurse may give medicines and take care of the port.   You or a family member can get special training and directions for giving medicine and taking care of the port at home.  SEEK MEDICAL CARE IF:   Your port does not flush or you are unable to get a blood return.   You have a fever or chills. SEEK IMMEDIATE MEDICAL CARE IF:  You have new fluid or pus coming from your incision.   You notice a bad smell coming from your incision site.   You have swelling, pain, or more redness at the incision or port site.   You have chest pain or shortness of breath. This information is not intended to replace advice given to you by your health care provider. Make sure you discuss any questions you have with your health care provider. Document  Released: 07/29/2013 Document Revised: 10/13/2013 Document Reviewed: 07/29/2013 Elsevier Interactive Patient Education  2017 [employer]  

## 2016-11-12 NOTE — Procedures (Signed)
Patient came to IR to have biliary drain trial capping at Dr. Gearldine Shown request.  Leg bag removed and cap placed.  Family to continue current flushing regimen.  New leg bag sent home with patient incase patient experiences pain or leaking at the site.

## 2016-11-12 NOTE — Progress Notes (Addendum)
[place] OFFICE PROGRESS NOTE   Diagnosis:  Pancreas cancer  INTERVAL HISTORY:   Jacob Hardin returns as scheduled. He was last treated with FOLFIRI 09/19/2016. Treatment subsequently placed on hold due to biliary obstruction. He underwent a cholangiogram and exchange of biliary drain 11/07/2016. Common bile duct stent showed wide patency. Right and left intrahepatic biliary systems were decompressed and drained freely into the common bile duct.  He is feeling better. Urine is lighter. The itching has resolved. Appetite has improved. No nausea or vomiting. No fever. No constipation or diarrhea. No headaches. No shortness of breath or chest pain.  Objective:  Vital signs in last 24 hours:  Blood pressure (!) 182/83, pulse (!) 59, temperature 97.8 F (36.6 C), temperature source Oral, resp. rate 18, height 6' (1.829 m), weight 152 lb 3.2 oz (69 kg), SpO2 100 %.    HEENT: No thrush or ulcers. Resp: Lungs clear bilaterally. Cardio: Regular rate and rhythm. GI: Abdomen soft and nontender. No hepatomegaly. Right upper quadrant drain. Vascular: No leg edema. Calves soft and nontender. Port-A-Cath without erythema.    Lab Results:  Lab Results  Component Value Date   WBC 4.8 11/12/2016   HGB 10.3 (L) 11/12/2016   HCT 30.7 (L) 11/12/2016   MCV 90.8 11/12/2016   PLT 182 11/12/2016   NEUTROABS 3.5 11/12/2016    Imaging:  No results found.  Medications: I have reviewed the patient's current medications.  Assessment/Plan: 1. 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 Upper endoscopy 11/18/2015 confirmed duodenal invasion/obstruction with a biopsy confirming adenocarcinoma ? Placement of a duodenal stent 11/22/2015 ? MRI abdomen 11/15/2015 revealed a pancreas head mass and liver metastases ? Cycle 1 FOLFIRINOX 12/06/2015 ? Cycle 2 FOLFIRINOX 12/21/2015 ? Cycle 3 FOLFIRINOX 01/04/2016 ? CT  abdomen/pelvis 01/19/2016 showed improvement in the pancreatic head mass and liver metastases. ? Cycle 4 FOLFIRINOX 02/01/2016 ? Cycle 5 FOLFIRINOX 02/15/2016 ? Cycle 6 FOLFIRINOX 02/29/2016 ? Cycle 7 FOLFIRINOX 03/14/2016 ? Cycle 8 FOLFIRINOX 03/28/2016 ? CT abdomen/pelvis 04/09/2016-improvement in pancreas head mass and liver metastases, no new metastatic disease ? Chemotherapy switch to FOLFIRI every 3 weeks beginning 04/11/2016 ? CT 07/30/2016-improvement in liver metastases, stable pancreas mass  ? FOLFIRI continued on a 3 week schedule ? FOLFIRI 09/19/2016. ? Treatment subsequently placed on hold due to biliary obstruction  2. Diabetes  3. History of Anorexia/weight loss secondary to #1  4. Obstructive jaundice secondary to #1-status post placement of a percutaneous internal/external biliary drain 11/25/2015; biliary drainage catheter capped 12/02/2015; biliary drain internalized 12/13/2015  5. Port-A-Cath placement 11/30/2015 interventional radiology  6. Admission 01/17/2016 with a high fever and rigors-no apparent source for infection upon review of his history and examination, blood cultures positive for gram-positive cocci--viridans streptococcus--completed 2 weeks of IV ceftriaxone; no endocarditis on TTE.  7. Oxaliplatin neuropathy  8. Elevated transaminases, alkaline phosphatase, and bilirubin 06/13/2016.06/19/2016 bilirubin improved to normal range; liver enzymes remain elevated.  9. Generalized pruritus,no rash 10/10/2016.Likely secondary to hyperbilirubinemia.   10. Hospitalization 10/10/2016 through 10/20/2016 with biliary obstruction/sepsis. Status post placement of a biliary drain in interventional radiology 10/16/2016. Status post ERCP 10/26/2016 with biliary stent exchange. There was no bile drainage. Status post cholangiogram and exchange of biliary drain 11/07/2016.    Disposition: Mr. Milone appears stable. The bilirubin is  better. Other LFTs remain elevated. Chemotherapy will remain on hold. Dr. [name] spoke to Dr. Vernard Gambles, interventional radiology at [facility]. The biliary drain will  be capped today. Mr. Su Grand will return for a follow-up chemistry panel on 11/14/2016.  We discussed that his blood pressure was elevated on multiple readings in the office today. He reports blood pressure medication was discontinued one year ago. His wife will check his blood pressure at home and call us with those readings. We may need to resume blood pressure medication.   He will return for a follow-up visit in 3 weeks. He will contact the office in the interim with any problems.  Patient seen with Dr. [name]. 25 minutes were spent face-to-face at today's visit with the majority of that time involved in counseling/coordination of care.    [name] ANP/GNP-BC   11/12/2016  11:05 AM  This was a shared visit with [name]. Mr. Woodhull appears well. He underwent a cholangiogram and exchange of the internal-external biliary drain on 11/07/2016. I discussed the case with Dr. Vernard Gambles and he reports the biliary system is decompressed. The drain will be capped today.  Mr. Slocumb has been maintained off of chemotherapy since 09/19/2016. There is no clinical evidence of disease progression. We decided to place chemotherapy on hold.  [name], M.D.

## 2016-11-12 NOTE — Telephone Encounter (Signed)
Appointments scheduled per 11/12/16 los. Patient was given a copy of the updated appointment schedule and AVS report per 11/12/16 los. ° °

## 2016-11-12 NOTE — Telephone Encounter (Signed)
Appointments scheduled per 11/12/16 los. Patient was given a copy of the appointment schedule and AVS report, per 11/12/16 los. ° °

## 2016-11-14 ENCOUNTER — Telehealth: Payer: Self-pay

## 2016-11-14 ENCOUNTER — Other Ambulatory Visit (HOSPITAL_COMMUNITY): Payer: Self-pay | Admitting: Gastroenterology

## 2016-11-14 ENCOUNTER — Other Ambulatory Visit (HOSPITAL_BASED_OUTPATIENT_CLINIC_OR_DEPARTMENT_OTHER): Payer: Medicare Other

## 2016-11-14 ENCOUNTER — Other Ambulatory Visit: Payer: Self-pay | Admitting: Nurse Practitioner

## 2016-11-14 DIAGNOSIS — C787 Secondary malignant neoplasm of liver and intrahepatic bile duct: Secondary | ICD-10-CM | POA: Diagnosis not present

## 2016-11-14 DIAGNOSIS — C250 Malignant neoplasm of head of pancreas: Secondary | ICD-10-CM | POA: Diagnosis not present

## 2016-11-14 DIAGNOSIS — C259 Malignant neoplasm of pancreas, unspecified: Secondary | ICD-10-CM

## 2016-11-14 DIAGNOSIS — C799 Secondary malignant neoplasm of unspecified site: Secondary | ICD-10-CM

## 2016-11-14 LAB — COMPREHENSIVE METABOLIC PANEL
ALBUMIN: 2.9 g/dL — AB (ref 3.5–5.0)
ALT: 94 U/L — AB (ref 0–55)
AST: 93 U/L — AB (ref 5–34)
Anion Gap: 10 mEq/L (ref 3–11)
BILIRUBIN TOTAL: 1.71 mg/dL — AB (ref 0.20–1.20)
BUN: 12.2 mg/dL (ref 7.0–26.0)
CALCIUM: 9.4 mg/dL (ref 8.4–10.4)
CO2: 25 mEq/L (ref 22–29)
CREATININE: 0.9 mg/dL (ref 0.7–1.3)
Chloride: 103 mEq/L (ref 98–109)
EGFR: 89 mL/min/{1.73_m2} — ABNORMAL LOW (ref >90)
GLUCOSE: 229 mg/dL — AB (ref 70–140)
Potassium: 3.9 mEq/L (ref 3.5–5.1)
SODIUM: 139 meq/L (ref 136–145)
TOTAL PROTEIN: 6.9 g/dL (ref 6.4–8.3)

## 2016-11-14 MED ORDER — CIPROFLOXACIN HCL 500 MG PO TABS: 500.0000 mg | ORAL_TABLET | Freq: Two times a day (BID) | ORAL | 0 refills | Status: DC

## 2016-11-14 MED ORDER — CIPROFLOXACIN HCL 500 MG PO TABS
500.0000 mg | ORAL_TABLET | Freq: Two times a day (BID) | ORAL | 0 refills | Status: DC
Start: 2016-11-14 — End: 2016-12-03

## 2016-11-14 NOTE — Telephone Encounter (Signed)
Called and informed pts wife, patients bilirubin is stable and to make sure to take a drain bag to drain on there cruise if pt shows any signs of infection, fever chills etc. Also sent in abx to pharmacy, pt is to bring with him and take if any signs of infection. Pt and wife verbalized understanding and know to call with any further questions or concerns.

## 2016-12-03 ENCOUNTER — Telehealth: Payer: Self-pay | Admitting: Nurse Practitioner

## 2016-12-03 ENCOUNTER — Ambulatory Visit (HOSPITAL_BASED_OUTPATIENT_CLINIC_OR_DEPARTMENT_OTHER): Payer: Medicare Other | Admitting: Nurse Practitioner

## 2016-12-03 ENCOUNTER — Other Ambulatory Visit (HOSPITAL_BASED_OUTPATIENT_CLINIC_OR_DEPARTMENT_OTHER): Payer: Medicare Other

## 2016-12-03 ENCOUNTER — Ambulatory Visit (HOSPITAL_BASED_OUTPATIENT_CLINIC_OR_DEPARTMENT_OTHER): Payer: Medicare Other

## 2016-12-03 VITALS — BP 143/82 | HR 74 | Temp 98.4°F | Resp 18 | Wt 153.1 lb

## 2016-12-03 DIAGNOSIS — G620 Drug-induced polyneuropathy: Secondary | ICD-10-CM

## 2016-12-03 DIAGNOSIS — R740 Nonspecific elevation of levels of transaminase and lactic acid dehydrogenase [LDH]: Secondary | ICD-10-CM

## 2016-12-03 DIAGNOSIS — R748 Abnormal levels of other serum enzymes: Secondary | ICD-10-CM | POA: Diagnosis not present

## 2016-12-03 DIAGNOSIS — E119 Type 2 diabetes mellitus without complications: Secondary | ICD-10-CM

## 2016-12-03 DIAGNOSIS — C250 Malignant neoplasm of head of pancreas: Secondary | ICD-10-CM

## 2016-12-03 DIAGNOSIS — C799 Secondary malignant neoplasm of unspecified site: Secondary | ICD-10-CM

## 2016-12-03 DIAGNOSIS — C787 Secondary malignant neoplasm of liver and intrahepatic bile duct: Secondary | ICD-10-CM | POA: Diagnosis not present

## 2016-12-03 DIAGNOSIS — Z452 Encounter for adjustment and management of vascular access device: Secondary | ICD-10-CM

## 2016-12-03 DIAGNOSIS — L299 Pruritus, unspecified: Secondary | ICD-10-CM | POA: Diagnosis not present

## 2016-12-03 DIAGNOSIS — Z95828 Presence of other vascular implants and grafts: Secondary | ICD-10-CM

## 2016-12-03 LAB — COMPREHENSIVE METABOLIC PANEL
ALBUMIN: 3.2 g/dL — AB (ref 3.5–5.0)
ALT: 91 U/L — ABNORMAL HIGH (ref 0–55)
ANION GAP: 9 meq/L (ref 3–11)
AST: 72 U/L — AB (ref 5–34)
BUN: 14.6 mg/dL (ref 7.0–26.0)
CALCIUM: 9.6 mg/dL (ref 8.4–10.4)
CO2: 28 mEq/L (ref 22–29)
Chloride: 100 mEq/L (ref 98–109)
Creatinine: 0.9 mg/dL (ref 0.7–1.3)
EGFR: 87 mL/min/{1.73_m2} — ABNORMAL LOW (ref >90)
Glucose: 269 mg/dl — ABNORMAL HIGH (ref 70–140)
POTASSIUM: 4 meq/L (ref 3.5–5.1)
Sodium: 137 mEq/L (ref 136–145)
Total Bilirubin: 1.66 mg/dL — ABNORMAL HIGH (ref 0.20–1.20)
Total Protein: 7.2 g/dL (ref 6.4–8.3)

## 2016-12-03 LAB — CBC WITH DIFFERENTIAL/PLATELET
BASO%: 0.5 % (ref 0.0–2.0)
Basophils Absolute: 0 10*3/uL (ref 0.0–0.1)
EOS%: 3.2 % (ref 0.0–7.0)
Eosinophils Absolute: 0.2 10*3/uL (ref 0.0–0.5)
HCT: 32.8 % — ABNORMAL LOW (ref 38.4–49.9)
HGB: 10.9 g/dL — ABNORMAL LOW (ref 13.0–17.1)
LYMPH%: 14.6 % (ref 14.0–49.0)
MCH: 29.8 pg (ref 27.2–33.4)
MCHC: 33.2 g/dL (ref 32.0–36.0)
MCV: 89.6 fL (ref 79.3–98.0)
MONO#: 0.5 10*3/uL (ref 0.1–0.9)
MONO%: 8.1 % (ref 0.0–14.0)
NEUT%: 73.6 % (ref 39.0–75.0)
NEUTROS ABS: 4.1 10*3/uL (ref 1.5–6.5)
NRBC: 0 % (ref 0–0)
Platelets: 150 10*3/uL (ref 140–400)
RBC: 3.66 10*6/uL — AB (ref 4.20–5.82)
RDW: 14.9 % — ABNORMAL HIGH (ref 11.0–14.6)
WBC: 5.6 10*3/uL (ref 4.0–10.3)
lymph#: 0.8 10*3/uL — ABNORMAL LOW (ref 0.9–3.3)

## 2016-12-03 MED ORDER — HEPARIN SOD (PORK) LOCK FLUSH 100 UNIT/ML IV SOLN
500.0000 [IU] | Freq: Once | INTRAVENOUS | Status: DC | PRN
  Filled 2016-12-03: qty 5

## 2016-12-03 MED ORDER — SODIUM CHLORIDE 0.9 % IJ SOLN
10.0000 mL | INTRAMUSCULAR | Status: DC | PRN
  Administered 2016-12-03: 10 mL via INTRAVENOUS
  Filled 2016-12-03: qty 10

## 2016-12-03 NOTE — Patient Instructions (Signed)

## 2016-12-03 NOTE — Telephone Encounter (Signed)
Appointments scheduled per 2/12 LOS. Patient given AVS report and calendars with future scheduled appointments. °

## 2016-12-03 NOTE — Progress Notes (Addendum)
[place] OFFICE PROGRESS NOTE   Diagnosis:  Pancreas cancer  INTERVAL HISTORY:   Jacob Hardin returns as scheduled. He reports generalized pruritus beginning 3-4 days ago. The "itch medicine" is not working. He is having difficulty sleeping due to the itching. No fever or chills. No nausea or vomiting. Appetite is good for the first 2 meals of the day. No diarrhea. He typically develops discomfort at the right and left abdomen in the afternoons. The discomfort is relieved with Tylenol.  Objective:  Vital signs in last 24 hours:  Blood pressure (!) 143/82, pulse 74, temperature 98.4 F (36.9 C), temperature source Oral, resp. rate 18, weight 153 lb 1.6 oz (69.4 kg), SpO2 100 %.    HEENT: Sclera anicteric. No thrush or ulcers. Resp: Lungs clear bilaterally. Cardio: Regular rate and rhythm. GI: Abdomen soft and nontender. No hepatomegaly. Right upper quadrant drain. Vascular: No leg edema. Calves soft and nontender. Neuro: Alert and oriented.  Skin: No rash. Port-A-Cath without erythema.    Lab Results:  Lab Results  Component Value Date   WBC 5.6 12/03/2016   HGB 10.9 (L) 12/03/2016   HCT 32.8 (L) 12/03/2016   MCV 89.6 12/03/2016   PLT 150 12/03/2016   NEUTROABS 4.1 12/03/2016    Imaging:  No results found.  Medications: I have reviewed the patient's current medications.  Assessment/Plan: 1. 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 Upper endoscopy 11/18/2015 confirmed duodenal invasion/obstruction with a biopsy confirming adenocarcinoma ? Placement of a duodenal stent 11/22/2015 ? MRI abdomen 11/15/2015 revealed a pancreas head mass and liver metastases ? Cycle 1 FOLFIRINOX 12/06/2015 ? Cycle 2 FOLFIRINOX 12/21/2015 ? Cycle 3 FOLFIRINOX 01/04/2016 ? CT abdomen/pelvis 01/19/2016 showed improvement in the pancreatic head mass and liver metastases. ? Cycle 4 FOLFIRINOX  02/01/2016 ? Cycle 5 FOLFIRINOX 02/15/2016 ? Cycle 6 FOLFIRINOX 02/29/2016 ? Cycle 7 FOLFIRINOX 03/14/2016 ? Cycle 8 FOLFIRINOX 03/28/2016 ? CT abdomen/pelvis 04/09/2016-improvement in pancreas head mass and liver metastases, no new metastatic disease ? Chemotherapy switch to FOLFIRI every 3 weeks beginning 04/11/2016 ? CT 07/30/2016-improvement in liver metastases, stable pancreas mass  ? FOLFIRI continued on a 3 week schedule ? FOLFIRI 09/19/2016. ? Treatment subsequently placed on hold due to biliary obstruction  2. Diabetes  3. History of Anorexia/weight loss secondary to #1  4. Obstructive jaundice secondary to #1-status post placement of a percutaneous internal/external biliary drain 11/25/2015; biliary drainage catheter capped 12/02/2015; biliary drain internalized 12/13/2015  5. Port-A-Cath placement 11/30/2015 interventional radiology  6. Admission 01/17/2016 with a high fever and rigors-no apparent source for infection upon review of his history and examination, blood cultures positive for gram-positive cocci--viridans streptococcus--completed 2 weeks of IV ceftriaxone; no endocarditis on TTE.  7. Oxaliplatin neuropathy  8. Elevated transaminases, alkaline phosphatase, and bilirubin 06/13/2016.06/19/2016 bilirubin improved to normal range; liver enzymes remain elevated.  9. Generalized pruritus,no rash 10/10/2016.Likely secondary to hyperbilirubinemia. Resolved 11/12/2016. Recurrent 12/03/2016.  10. Hospitalization 10/10/2016 through 10/20/2016 with biliary obstruction/sepsis. Status post placement of a biliary drain in interventional radiology 10/16/2016. Status post ERCP 10/26/2016 with biliary stent exchange. There was no bile drainage. Status post cholangiogram and exchange of biliary drain 11/07/2016. Biliary drain capped 11/12/2016.    Disposition: Jacob Hardin appears stable. LFTs remain elevated and he has developed recurrent pruritus.  He understands this likely indicates biliary obstruction. He will uncap the biliary drain. We will contact Dr. Ulyses Amor office to request assistance with further management of the  persistently elevated liver function tests. He will contact the office with fever, chills.  Chemotherapy remains on hold.  He will return for a follow-up visit in approximately 4 weeks. He will contact the office in the interim as outlined above or with any other problems.  Patient seen with Dr. [name]. 25 minutes were spent face-to-face at today's visit with the majority of that time involved in counseling/coordination of care.    [name] ANP/GNP-BC   12/03/2016  8:50 AM  This was a shared visit with [name]. Jacob Hardin has persistent evidence of biliary obstruction. No clinical evidence for progression of the pancreas cancer. Systemic chemotherapy remains on hold. I discussed the case with Dr. [name]. He will review the workup to date and discussed the case with interventional radiology. He will contact Jacob Hardin this week. Jacob Hardin will open the external biliary drain.  He will return for an office visit in 4 weeks.  Jacob Hardin, M.D.

## 2016-12-04 LAB — CANCER ANTIGEN 19-9

## 2016-12-06 ENCOUNTER — Other Ambulatory Visit: Payer: Self-pay | Admitting: *Deleted

## 2016-12-06 MED ORDER — HYDROXYZINE HCL 25 MG PO TABS: 25.0000 mg | ORAL_TABLET | Freq: Three times a day (TID) | ORAL | 1 refills | Status: DC | PRN

## 2016-12-06 NOTE — Telephone Encounter (Signed)
Informed wife that Atarax script has been sent to pharmacy. She voiced appreciation for call. They have not yet heard from Dr. Ulyses Amor office.

## 2016-12-11 ENCOUNTER — Inpatient Hospital Stay (HOSPITAL_COMMUNITY): Admit: 2016-12-11 | Payer: Medicare Other

## 2016-12-13 ENCOUNTER — Other Ambulatory Visit: Payer: Self-pay | Admitting: Family Medicine

## 2016-12-14 ENCOUNTER — Other Ambulatory Visit: Payer: Self-pay | Admitting: *Deleted

## 2016-12-14 DIAGNOSIS — C250 Malignant neoplasm of head of pancreas: Secondary | ICD-10-CM

## 2016-12-14 MED ORDER — PANCRELIPASE (LIP-PROT-AMYL) 36000-114000 UNITS PO CPEP: 36000.0000 [IU] | ORAL_CAPSULE | Freq: Three times a day (TID) | ORAL | 1 refills | Status: DC

## 2016-12-31 ENCOUNTER — Telehealth: Payer: Self-pay | Admitting: Oncology

## 2016-12-31 ENCOUNTER — Ambulatory Visit (HOSPITAL_BASED_OUTPATIENT_CLINIC_OR_DEPARTMENT_OTHER): Payer: Medicare Other

## 2016-12-31 ENCOUNTER — Encounter: Payer: Self-pay | Admitting: Oncology

## 2016-12-31 ENCOUNTER — Ambulatory Visit (HOSPITAL_BASED_OUTPATIENT_CLINIC_OR_DEPARTMENT_OTHER): Payer: Medicare Other | Admitting: Oncology

## 2016-12-31 ENCOUNTER — Other Ambulatory Visit: Payer: Self-pay | Admitting: *Deleted

## 2016-12-31 ENCOUNTER — Other Ambulatory Visit (HOSPITAL_BASED_OUTPATIENT_CLINIC_OR_DEPARTMENT_OTHER): Payer: Medicare Other

## 2016-12-31 VITALS — BP 133/76 | HR 72 | Temp 98.0°F | Resp 18 | Ht 72.0 in | Wt 150.8 lb

## 2016-12-31 DIAGNOSIS — E119 Type 2 diabetes mellitus without complications: Secondary | ICD-10-CM

## 2016-12-31 DIAGNOSIS — G620 Drug-induced polyneuropathy: Secondary | ICD-10-CM | POA: Diagnosis not present

## 2016-12-31 DIAGNOSIS — C787 Secondary malignant neoplasm of liver and intrahepatic bile duct: Secondary | ICD-10-CM | POA: Diagnosis not present

## 2016-12-31 DIAGNOSIS — L299 Pruritus, unspecified: Secondary | ICD-10-CM | POA: Diagnosis not present

## 2016-12-31 DIAGNOSIS — C250 Malignant neoplasm of head of pancreas: Secondary | ICD-10-CM

## 2016-12-31 DIAGNOSIS — R109 Unspecified abdominal pain: Secondary | ICD-10-CM | POA: Diagnosis not present

## 2016-12-31 DIAGNOSIS — R630 Anorexia: Secondary | ICD-10-CM

## 2016-12-31 DIAGNOSIS — Z452 Encounter for adjustment and management of vascular access device: Secondary | ICD-10-CM

## 2016-12-31 DIAGNOSIS — R740 Nonspecific elevation of levels of transaminase and lactic acid dehydrogenase [LDH]: Secondary | ICD-10-CM | POA: Diagnosis not present

## 2016-12-31 DIAGNOSIS — Z95828 Presence of other vascular implants and grafts: Secondary | ICD-10-CM

## 2016-12-31 DIAGNOSIS — C799 Secondary malignant neoplasm of unspecified site: Secondary | ICD-10-CM

## 2016-12-31 LAB — COMPREHENSIVE METABOLIC PANEL
ALT: 123 U/L — ABNORMAL HIGH (ref 0–55)
ANION GAP: 9 meq/L (ref 3–11)
AST: 116 U/L — AB (ref 5–34)
Albumin: 3 g/dL — ABNORMAL LOW (ref 3.5–5.0)
Alkaline Phosphatase: 1080 U/L — ABNORMAL HIGH (ref 40–150)
BUN: 12.6 mg/dL (ref 7.0–26.0)
CALCIUM: 9.4 mg/dL (ref 8.4–10.4)
CHLORIDE: 100 meq/L (ref 98–109)
CO2: 27 mEq/L (ref 22–29)
Creatinine: 0.8 mg/dL (ref 0.7–1.3)
EGFR: 90 mL/min/{1.73_m2} — ABNORMAL LOW (ref >90)
Glucose: 247 mg/dl — ABNORMAL HIGH (ref 70–140)
POTASSIUM: 3.9 meq/L (ref 3.5–5.1)
Sodium: 137 mEq/L (ref 136–145)
Total Bilirubin: 1.18 mg/dL (ref 0.20–1.20)
Total Protein: 7 g/dL (ref 6.4–8.3)

## 2016-12-31 LAB — CBC WITH DIFFERENTIAL/PLATELET
BASO%: 0.3 % (ref 0.0–2.0)
BASOS ABS: 0 10*3/uL (ref 0.0–0.1)
EOS%: 1.7 % (ref 0.0–7.0)
Eosinophils Absolute: 0.1 10*3/uL (ref 0.0–0.5)
HEMATOCRIT: 33.7 % — AB (ref 38.4–49.9)
HGB: 10.9 g/dL — ABNORMAL LOW (ref 13.0–17.1)
LYMPH#: 0.7 10*3/uL — AB (ref 0.9–3.3)
LYMPH%: 12.4 % — AB (ref 14.0–49.0)
MCH: 29.1 pg (ref 27.2–33.4)
MCHC: 32.3 g/dL (ref 32.0–36.0)
MCV: 90.1 fL (ref 79.3–98.0)
MONO#: 0.5 10*3/uL (ref 0.1–0.9)
MONO%: 9.1 % (ref 0.0–14.0)
NEUT#: 4.4 10*3/uL (ref 1.5–6.5)
NEUT%: 76.5 % — AB (ref 39.0–75.0)
PLATELETS: 174 10*3/uL (ref 140–400)
RBC: 3.74 10*6/uL — AB (ref 4.20–5.82)
RDW: 15.3 % — ABNORMAL HIGH (ref 11.0–14.6)
WBC: 5.7 10*3/uL (ref 4.0–10.3)

## 2016-12-31 MED ORDER — TRAMADOL HCL 50 MG PO TABS: 50.0000 mg | ORAL_TABLET | Freq: Four times a day (QID) | ORAL | 0 refills | Status: DC | PRN

## 2016-12-31 MED ORDER — HEPARIN SOD (PORK) LOCK FLUSH 100 UNIT/ML IV SOLN
500.0000 [IU] | Freq: Once | INTRAVENOUS | Status: AC | PRN
  Administered 2016-12-31: 500 [IU] via INTRAVENOUS
  Filled 2016-12-31: qty 5

## 2016-12-31 MED ORDER — SODIUM CHLORIDE 0.9 % IJ SOLN
10.0000 mL | INTRAMUSCULAR | Status: DC | PRN
  Administered 2016-12-31: 10 mL via INTRAVENOUS
  Filled 2016-12-31: qty 10

## 2016-12-31 NOTE — Patient Instructions (Signed)
Implanted Port Home Guide An implanted port is a type of central line that is placed under the skin. Central lines are used to provide IV access when treatment or nutrition needs to be given through a person's veins. Implanted ports are used for long-term IV access. An implanted port may be placed because:  You need IV medicine that would be irritating to the small veins in your hands or arms.  You need long-term IV medicines, such as antibiotics.  You need IV nutrition for a long period.  You need frequent blood draws for lab tests.  You need dialysis.  Implanted ports are usually placed in the chest area, but they can also be placed in the upper arm, the abdomen, or the leg. An implanted port has two main parts:  Reservoir. The reservoir is round and will appear as a small, raised area under your skin. The reservoir is the part where a needle is inserted to give medicines or draw blood.  Catheter. The catheter is a thin, flexible tube that extends from the reservoir. The catheter is placed into a large vein. Medicine that is inserted into the reservoir goes into the catheter and then into the vein.  How will I care for my incision site? Do not get the incision site wet. Bathe or shower as directed by your health care provider. How is my port accessed? Special steps must be taken to access the port:  Before the port is accessed, a numbing cream can be placed on the skin. This helps numb the skin over the port site.  Your health care provider uses a sterile technique to access the port. ? Your health care provider must put on a mask and sterile gloves. ? The skin over your port is cleaned carefully with an antiseptic and allowed to dry. ? The port is gently pinched between sterile gloves, and a needle is inserted into the port.  Only "non-coring" port needles should be used to access the port. Once the port is accessed, a blood return should be checked. This helps ensure that the port  is in the vein and is not clogged.  If your port needs to remain accessed for a constant infusion, a clear (transparent) bandage will be placed over the needle site. The bandage and needle will need to be changed every week, or as directed by your health care provider.  Keep the bandage covering the needle clean and dry. Do not get it wet. Follow your health care provider's instructions on how to take a shower or bath while the port is accessed.  If your port does not need to stay accessed, no bandage is needed over the port.  What is flushing? Flushing helps keep the port from getting clogged. Follow your health care provider's instructions on how and when to flush the port. Ports are usually flushed with saline solution or a medicine called heparin. The need for flushing will depend on how the port is used.  If the port is used for intermittent medicines or blood draws, the port will need to be flushed: ? After medicines have been given. ? After blood has been drawn. ? As part of routine maintenance.  If a constant infusion is running, the port may not need to be flushed.  How long will my port stay implanted? The port can stay in for as long as your health care provider thinks it is needed. When it is time for the port to come out, surgery will be   done to remove it. The procedure is similar to the one performed when the port was put in. When should I seek immediate medical care? When you have an implanted port, you should seek immediate medical care if:  You notice a bad smell coming from the incision site.  You have swelling, redness, or drainage at the incision site.  You have more swelling or pain at the port site or the surrounding area.  You have a fever that is not controlled with medicine.  This information is not intended to replace advice given to you by your health care provider. Make sure you discuss any questions you have with your health care provider. Document  Released: 10/08/2005 Document Revised: 03/15/2016 Document Reviewed: 06/15/2013 Elsevier Interactive Patient Education  2017 [employer] Inc[employer]  

## 2016-12-31 NOTE — Progress Notes (Signed)
[facility] OFFICE PROGRESS NOTE   Diagnosis: Pancreas cancer  INTERVAL HISTORY:   Jacob Hardin  returns as scheduled. The biliary drain remains in place. The drain is capped. Jacob Hardin continues to have pruritus. Jacob Hardin is taking hydroxyzine and a new medication prescribed by Dr. [name]. The pruritus is partially relieved with the current medical regimen. Jacob Hardin complains of anorexia. Jacob Hardin is also noted increased abdominal pain. The pain is relieved with Tylenol. The neuropathy symptoms are improved.  Objective:  Vital signs in last 24 hours:  Blood pressure 133/76, pulse 72, temperature 98 F (36.7 C), temperature source Oral, resp. rate 18, height 6' (1.829 m), weight 150 lb 12.8 oz (68.4 kg), SpO2 100 %.   Resp: Lungs clear bilaterally Cardio: Regular rate and rhythm GI: No hepatomegaly, nontender, right upper abdomen biliary drain with a gauze dressing Vascular: No leg edema   Portacath/PICC-without erythema  Lab Results:  Lab Results  Component Value Date   WBC 5.7 12/31/2016   HGB 10.9 (L) 12/31/2016   HCT 33.7 (L) 12/31/2016   MCV 90.1 12/31/2016   PLT 174 12/31/2016   NEUTROABS 4.4 12/31/2016   Creatinine 0.8, alkaline phosphatase 1080, albumin 3, AST 116, ALT 123, bilirubin 1.18  Medications: I have reviewed the patient's current medications.  Assessment/Plan: 1. 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  Cycle 4 FOLFIRINOX 02/01/2016  Cycle 5 FOLFIRINOX 02/15/2016  Cycle 6  FOLFIRINOX 02/29/2016  Cycle 7 FOLFIRINOX 03/14/2016  Cycle 8 FOLFIRINOX 03/28/2016  CT abdomen/pelvis 04/09/2016-improvement in pancreas head mass and liver metastases, no new metastatic disease  Chemotherapy switch to FOLFIRI every 3 weeks beginning 04/11/2016  CT 07/30/2016-improvement in liver metastases, stable pancreas mass   FOLFIRI continued on a 3 week schedule  FOLFIRI 09/19/2016.  Treatment subsequently placed on hold due to biliary obstruction  2. Diabetes  3. History of Anorexia/weight loss secondary to #1  4. Obstructive jaundice secondary to #1-status post placement of a percutaneous internal/external biliary drain 11/25/2015; biliary drainage catheter capped 12/02/2015; biliary drain internalized 12/13/2015  5. Port-A-Cath placement 11/30/2015 interventional radiology  6. Admission 01/17/2016 with a high fever and rigors-no apparent source for infection upon review of his history and examination, blood cultures positive for gram-positive cocci--viridans streptococcus--completed 2 weeks of IV ceftriaxone; no endocarditis on TTE.  7. Oxaliplatin neuropathy  8. Elevated transaminases, alkaline phosphatase, and bilirubin 06/13/2016.06/19/2016 bilirubin improved to normal range; liver enzymes remain elevated.  9. Generalized pruritus,no rash 10/10/2016.Likely secondary to hyperbilirubinemia. Resolved 11/12/2016. Recurrent 12/03/2016.  10. Hospitalization 10/10/2016 through 10/20/2016 with biliary obstruction/sepsis. Status post placement of a biliary drain in interventional radiology 10/16/2016. Status post ERCP 10/26/2016 with biliary stent exchange. There was no bile drainage. Status post cholangiogram and exchange of biliary drain 11/07/2016. Biliary drain capped 11/12/2016.    Disposition:  Jacob Hardin has been maintained off of chemotherapy for the past 3-1/2 months. We will follow-up on the CA 19-9 from today. Jacob Hardin has increased  abdominal pain and anorexia, potentially related to progression of the pancreas cancer. The hyperbilirubinemia has improved. I discussed treatment options with Jacob Hardin and his wife. The  plan is to resume systemic therapy with FOLFIRI. Jacob Hardin will be scheduled for FOLFIRI on 01/07/2017. Jacob Hardin will return for an office visit and chemotherapy on 01/21/2017.  Jacob Hardin will continue follow-up with Dr. [name] for management of the biliary obstruction and pruritus.  25 minutes were spent with the patient today. The majority of the time was used for counseling and coordination of care.  Betsy Coder, MD  12/31/2016  8:53 AM

## 2016-12-31 NOTE — Telephone Encounter (Signed)
Gave patient AVS and calender per 12/31/2016 los

## 2017-01-01 LAB — CANCER ANTIGEN 19-9: CA 19-9: 2014 U/mL — ABNORMAL HIGH (ref 0–35)

## 2017-01-04 ENCOUNTER — Other Ambulatory Visit: Payer: Self-pay | Admitting: *Deleted

## 2017-01-04 DIAGNOSIS — C250 Malignant neoplasm of head of pancreas: Secondary | ICD-10-CM

## 2017-01-07 ENCOUNTER — Ambulatory Visit (HOSPITAL_BASED_OUTPATIENT_CLINIC_OR_DEPARTMENT_OTHER): Payer: Medicare Other

## 2017-01-07 ENCOUNTER — Other Ambulatory Visit (HOSPITAL_BASED_OUTPATIENT_CLINIC_OR_DEPARTMENT_OTHER): Payer: Medicare Other

## 2017-01-07 VITALS — BP 185/82 | HR 64 | Temp 97.1°F | Resp 18

## 2017-01-07 DIAGNOSIS — C250 Malignant neoplasm of head of pancreas: Secondary | ICD-10-CM

## 2017-01-07 DIAGNOSIS — Z5111 Encounter for antineoplastic chemotherapy: Secondary | ICD-10-CM

## 2017-01-07 DIAGNOSIS — Z95828 Presence of other vascular implants and grafts: Secondary | ICD-10-CM

## 2017-01-07 DIAGNOSIS — Z452 Encounter for adjustment and management of vascular access device: Secondary | ICD-10-CM

## 2017-01-07 LAB — COMPREHENSIVE METABOLIC PANEL
ALT: 118 U/L — AB (ref 0–55)
ANION GAP: 10 meq/L (ref 3–11)
AST: 100 U/L — AB (ref 5–34)
Albumin: 3.1 g/dL — ABNORMAL LOW (ref 3.5–5.0)
Alkaline Phosphatase: 1211 U/L — ABNORMAL HIGH (ref 40–150)
BILIRUBIN TOTAL: 1.08 mg/dL (ref 0.20–1.20)
BUN: 11 mg/dL (ref 7.0–26.0)
CHLORIDE: 100 meq/L (ref 98–109)
CO2: 25 meq/L (ref 22–29)
CREATININE: 1 mg/dL (ref 0.7–1.3)
Calcium: 9.4 mg/dL (ref 8.4–10.4)
EGFR: 76 mL/min/{1.73_m2} — ABNORMAL LOW (ref >90)
GLUCOSE: 353 mg/dL — AB (ref 70–140)
Potassium: 4.3 mEq/L (ref 3.5–5.1)
Sodium: 135 mEq/L — ABNORMAL LOW (ref 136–145)
TOTAL PROTEIN: 7.5 g/dL (ref 6.4–8.3)

## 2017-01-07 LAB — CBC WITH DIFFERENTIAL/PLATELET
BASO%: 0.5 % (ref 0.0–2.0)
Basophils Absolute: 0 10*3/uL (ref 0.0–0.1)
EOS ABS: 0.1 10*3/uL (ref 0.0–0.5)
EOS%: 1.5 % (ref 0.0–7.0)
HEMATOCRIT: 33.2 % — AB (ref 38.4–49.9)
HGB: 11 g/dL — ABNORMAL LOW (ref 13.0–17.1)
LYMPH%: 12.8 % — AB (ref 14.0–49.0)
MCH: 30.1 pg (ref 27.2–33.4)
MCHC: 33.3 g/dL (ref 32.0–36.0)
MCV: 90.5 fL (ref 79.3–98.0)
MONO#: 0.5 10*3/uL (ref 0.1–0.9)
MONO%: 8.4 % (ref 0.0–14.0)
NEUT#: 4.2 10*3/uL (ref 1.5–6.5)
NEUT%: 76.8 % — AB (ref 39.0–75.0)
PLATELETS: 184 10*3/uL (ref 140–400)
RBC: 3.67 10*6/uL — AB (ref 4.20–5.82)
RDW: 16.1 % — ABNORMAL HIGH (ref 11.0–14.6)
WBC: 5.5 10*3/uL (ref 4.0–10.3)
lymph#: 0.7 10*3/uL — ABNORMAL LOW (ref 0.9–3.3)

## 2017-01-07 MED ORDER — ALTEPLASE 2 MG IJ SOLR
2.0000 mg | Freq: Once | INTRAMUSCULAR | Status: AC | PRN
  Administered 2017-01-07: 2 mg
  Filled 2017-01-07: qty 2

## 2017-01-07 MED ORDER — LEUCOVORIN CALCIUM INJECTION 350 MG
400.0000 mg/m2 | Freq: Once | INTRAMUSCULAR | Status: AC
  Administered 2017-01-07: 744 mg via INTRAVENOUS
  Filled 2017-01-07: qty 37.2

## 2017-01-07 MED ORDER — ATROPINE SULFATE 1 MG/ML IJ SOLN: 0.5000 mg | Freq: Once | INTRAMUSCULAR | Status: DC | PRN

## 2017-01-07 MED ORDER — SODIUM CHLORIDE 0.9 % IV SOLN
Freq: Once | INTRAVENOUS | Status: AC
  Administered 2017-01-07: 14:00:00 via INTRAVENOUS

## 2017-01-07 MED ORDER — PALONOSETRON HCL INJECTION 0.25 MG/5ML
0.2500 mg | Freq: Once | INTRAVENOUS | Status: AC
  Administered 2017-01-07: 0.25 mg via INTRAVENOUS

## 2017-01-07 MED ORDER — IRINOTECAN HCL CHEMO INJECTION 100 MG/5ML
180.0000 mg/m2 | Freq: Once | INTRAVENOUS | Status: AC
  Administered 2017-01-07: 340 mg via INTRAVENOUS
  Filled 2017-01-07: qty 15

## 2017-01-07 MED ORDER — SODIUM CHLORIDE 0.9 % IV SOLN
2400.0000 mg/m2 | INTRAVENOUS | Status: DC
  Administered 2017-01-07: 4450 mg via INTRAVENOUS
  Filled 2017-01-07: qty 89

## 2017-01-07 MED ORDER — SODIUM CHLORIDE 0.9 % IV SOLN
Freq: Once | INTRAVENOUS | Status: AC
  Administered 2017-01-07: 15:00:00 via INTRAVENOUS
  Filled 2017-01-07: qty 5

## 2017-01-07 MED ORDER — PALONOSETRON HCL INJECTION 0.25 MG/5ML
INTRAVENOUS | Status: AC
  Filled 2017-01-07: qty 5

## 2017-01-07 MED ORDER — SODIUM CHLORIDE 0.9 % IJ SOLN
10.0000 mL | INTRAMUSCULAR | Status: DC | PRN
  Administered 2017-01-07: 10 mL via INTRAVENOUS
  Filled 2017-01-07: qty 10

## 2017-01-07 NOTE — Progress Notes (Signed)
No blood return noted in flush room. No blood return when in infusion. Port flushes easily with normal saline no swelling, redness or burning noted. Cathflo instilled at 1225. Patient and spouse verbalized understanding. 1330 Positive blood return noted.   Ok to treat with AST, ALT and Alkaline Phosphatase today per Dr. [name]

## 2017-01-07 NOTE — Patient Instructions (Signed)
[facility] Discharge Instructions for Patients Receiving Chemotherapy  Today you received the following chemotherapy agents 5 Fu/Leucovorin/Irinotecan To help prevent nausea and vomiting after your treatment, we encourage you to take your nausea medication as prescribed. If you develop nausea and vomiting that is not controlled by your nausea medication, call the clinic.   BELOW ARE SYMPTOMS THAT SHOULD BE REPORTED IMMEDIATELY:  *FEVER GREATER THAN 100.5 F  *CHILLS WITH OR WITHOUT FEVER  NAUSEA AND VOMITING THAT IS NOT CONTROLLED WITH YOUR NAUSEA MEDICATION  *UNUSUAL SHORTNESS OF BREATH  *UNUSUAL BRUISING OR BLEEDING  TENDERNESS IN MOUTH AND THROAT WITH OR WITHOUT PRESENCE OF ULCERS  *URINARY PROBLEMS  *BOWEL PROBLEMS  UNUSUAL RASH Items with * indicate a potential emergency and should be followed up as soon as possible.  Feel free to call the clinic you have any questions or concerns. The clinic phone number is [phone].  Please show the [place] at check-in to the Emergency Department and triage nurse.

## 2017-01-07 NOTE — Progress Notes (Signed)
Port had no blood return after several flushes and repositioning. Chemo nurse Juliann Pulse was made aware. Cathflow released and pt was arrived to infusion. Porsche Cates LPN

## 2017-01-09 ENCOUNTER — Ambulatory Visit (HOSPITAL_BASED_OUTPATIENT_CLINIC_OR_DEPARTMENT_OTHER): Payer: Medicare Other

## 2017-01-09 VITALS — BP 142/80 | HR 68 | Temp 98.3°F | Resp 18

## 2017-01-09 DIAGNOSIS — Z452 Encounter for adjustment and management of vascular access device: Secondary | ICD-10-CM

## 2017-01-09 DIAGNOSIS — C250 Malignant neoplasm of head of pancreas: Secondary | ICD-10-CM

## 2017-01-09 MED ORDER — HEPARIN SOD (PORK) LOCK FLUSH 100 UNIT/ML IV SOLN
500.0000 [IU] | Freq: Once | INTRAVENOUS | Status: AC | PRN
  Administered 2017-01-09: 500 [IU]
  Filled 2017-01-09: qty 5

## 2017-01-09 MED ORDER — SODIUM CHLORIDE 0.9% FLUSH
10.0000 mL | INTRAVENOUS | Status: DC | PRN
  Administered 2017-01-09: 10 mL
  Filled 2017-01-09: qty 10

## 2017-01-21 ENCOUNTER — Ambulatory Visit: Payer: Medicare Other

## 2017-01-21 ENCOUNTER — Ambulatory Visit (HOSPITAL_BASED_OUTPATIENT_CLINIC_OR_DEPARTMENT_OTHER): Payer: Medicare Other | Admitting: Oncology

## 2017-01-21 ENCOUNTER — Telehealth: Payer: Self-pay | Admitting: Oncology

## 2017-01-21 ENCOUNTER — Other Ambulatory Visit (HOSPITAL_BASED_OUTPATIENT_CLINIC_OR_DEPARTMENT_OTHER): Payer: Medicare Other

## 2017-01-21 VITALS — BP 143/78 | HR 67 | Temp 98.6°F | Resp 16 | Wt 149.4 lb

## 2017-01-21 DIAGNOSIS — E119 Type 2 diabetes mellitus without complications: Secondary | ICD-10-CM | POA: Diagnosis not present

## 2017-01-21 DIAGNOSIS — C787 Secondary malignant neoplasm of liver and intrahepatic bile duct: Secondary | ICD-10-CM | POA: Diagnosis not present

## 2017-01-21 DIAGNOSIS — G620 Drug-induced polyneuropathy: Secondary | ICD-10-CM | POA: Diagnosis not present

## 2017-01-21 DIAGNOSIS — C250 Malignant neoplasm of head of pancreas: Secondary | ICD-10-CM

## 2017-01-21 DIAGNOSIS — Z452 Encounter for adjustment and management of vascular access device: Secondary | ICD-10-CM | POA: Diagnosis not present

## 2017-01-21 DIAGNOSIS — Z95828 Presence of other vascular implants and grafts: Secondary | ICD-10-CM

## 2017-01-21 LAB — COMPREHENSIVE METABOLIC PANEL
ALT: 102 U/L — ABNORMAL HIGH (ref 0–55)
AST: 80 U/L — AB (ref 5–34)
Albumin: 3 g/dL — ABNORMAL LOW (ref 3.5–5.0)
Alkaline Phosphatase: 1129 U/L — ABNORMAL HIGH (ref 40–150)
Anion Gap: 9 mEq/L (ref 3–11)
BILIRUBIN TOTAL: 0.79 mg/dL (ref 0.20–1.20)
BUN: 10.8 mg/dL (ref 7.0–26.0)
CO2: 26 meq/L (ref 22–29)
CREATININE: 0.8 mg/dL (ref 0.7–1.3)
Calcium: 8.9 mg/dL (ref 8.4–10.4)
Chloride: 100 mEq/L (ref 98–109)
EGFR: 90 mL/min/{1.73_m2} — ABNORMAL LOW (ref >90)
GLUCOSE: 257 mg/dL — AB (ref 70–140)
Potassium: 3.9 mEq/L (ref 3.5–5.1)
SODIUM: 135 meq/L — AB (ref 136–145)
TOTAL PROTEIN: 6.7 g/dL (ref 6.4–8.3)

## 2017-01-21 LAB — CBC WITH DIFFERENTIAL/PLATELET
BASO%: 0.6 % (ref 0.0–2.0)
Basophils Absolute: 0 10*3/uL (ref 0.0–0.1)
EOS ABS: 0.2 10*3/uL (ref 0.0–0.5)
EOS%: 2.8 % (ref 0.0–7.0)
HCT: 30.6 % — ABNORMAL LOW (ref 38.4–49.9)
HEMOGLOBIN: 10.1 g/dL — AB (ref 13.0–17.1)
LYMPH#: 0.6 10*3/uL — AB (ref 0.9–3.3)
LYMPH%: 11.7 % — ABNORMAL LOW (ref 14.0–49.0)
MCH: 29.5 pg (ref 27.2–33.4)
MCHC: 33 g/dL (ref 32.0–36.0)
MCV: 89.5 fL (ref 79.3–98.0)
MONO#: 0.4 10*3/uL (ref 0.1–0.9)
MONO%: 8.2 % (ref 0.0–14.0)
NEUT%: 76.7 % — ABNORMAL HIGH (ref 39.0–75.0)
NEUTROS ABS: 4.1 10*3/uL (ref 1.5–6.5)
PLATELETS: 155 10*3/uL (ref 140–400)
RBC: 3.42 10*6/uL — ABNORMAL LOW (ref 4.20–5.82)
RDW: 15.5 % — AB (ref 11.0–14.6)
WBC: 5.4 10*3/uL (ref 4.0–10.3)

## 2017-01-21 MED ORDER — HEPARIN SOD (PORK) LOCK FLUSH 100 UNIT/ML IV SOLN
500.0000 [IU] | Freq: Once | INTRAVENOUS | Status: AC
  Administered 2017-01-21: 500 [IU] via INTRAVENOUS
  Filled 2017-01-21: qty 5

## 2017-01-21 MED ORDER — SODIUM CHLORIDE 0.9% FLUSH
10.0000 mL | INTRAVENOUS | Status: DC | PRN
  Administered 2017-01-21: 10 mL via INTRAVENOUS
  Filled 2017-01-21: qty 10

## 2017-01-21 MED ORDER — SODIUM CHLORIDE 0.9 % IJ SOLN
10.0000 mL | INTRAMUSCULAR | Status: DC | PRN
  Administered 2017-01-21: 10 mL via INTRAVENOUS
  Filled 2017-01-21: qty 10

## 2017-01-21 NOTE — Telephone Encounter (Signed)
Gave patient AVS and scheduled appts per 01/21/2017 los.

## 2017-01-21 NOTE — Progress Notes (Signed)
Home OFFICE PROGRESS NOTE   Diagnosis: Pancreas cancer  INTERVAL HISTORY:   Jacob Hardin returns as scheduled. He completed a cycle of FOLFIRI beginning 01/07/2017. He reports profound malaise following chemotherapy. This lasted for up to a week. He was bedbound on the day following chemotherapy. He reports nausea beginning on the evening of chemotherapy. No emesis. His activity level and oral intake have improved over the past several days. No mouth sores, fever, or diarrhea. Pruritus has improved.  Objective:  Vital signs in last 24 hours:  Blood pressure (!) 143/78, pulse 67, temperature 98.6 F (37 C), temperature source Oral, resp. rate 16, weight 149 lb 6.4 oz (67.8 kg), SpO2 100 %.    HEENT: No thrush or ulcers Resp: Lungs clear bilaterally Cardio: Regular rate and rhythm GI: No hepatosplenomegaly, biliary drain site with a gauze dressing, nontender Vascular: No leg edema   Portacath/PICC-without erythema  Lab Results:  Lab Results  Component Value Date   WBC 5.4 01/21/2017   HGB 10.1 (L) 01/21/2017   HCT 30.6 (L) 01/21/2017   MCV 89.5 01/21/2017   PLT 155 01/21/2017   NEUTROABS 4.1 01/21/2017   Potassium 3.9, creatinine 0.8, alkaline phosphatase 1129, AST 80, ALT 102, bilirubin 0.79  Medications: I have reviewed the patient's current medications.  Assessment/Plan: 1. 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  Cycle 4 FOLFIRINOX 02/01/2016  Cycle 5  FOLFIRINOX 02/15/2016  Cycle 6 FOLFIRINOX 02/29/2016  Cycle 7 FOLFIRINOX 03/14/2016  Cycle 8 FOLFIRINOX 03/28/2016  CT abdomen/pelvis 04/09/2016-improvement in pancreas head mass and liver metastases, no new metastatic disease  Chemotherapy switch to FOLFIRI every 3 weeks beginning 04/11/2016  CT 07/30/2016-improvement in liver metastases, stable pancreas mass   FOLFIRI continued on a 3 week schedule  FOLFIRI 09/19/2016.  Treatment subsequently placed on hold due to biliary obstruction  Treatment resumed with FOLFIRI 01/07/2017  2. Diabetes  3. History of Anorexia/weight loss secondary to #1  4. Obstructive jaundice secondary to #1-status post placement of a percutaneous internal/external biliary drain 11/25/2015; biliary drainage catheter capped 12/02/2015; biliary drain internalized 12/13/2015  5. Port-A-Cath placement 11/30/2015 interventional radiology  6. Admission 01/17/2016 with a high fever and rigors-no apparent source for infection upon review of his history and examination, blood cultures positive for gram-positive cocci--viridans streptococcus--completed 2 weeks of IV ceftriaxone; no endocarditis on TTE.  7. Oxaliplatin neuropathy  8. Elevated transaminases, alkaline phosphatase, and bilirubin 06/13/2016.06/19/2016 bilirubin improved to normal range; liver enzymes remain elevated.  9. Generalized pruritus,no rash 10/10/2016.Likely secondary to hyperbilirubinemia. Resolved 11/12/2016. Recurrent 12/03/2016.  10. Hospitalization 10/10/2016 through 10/20/2016 with biliary obstruction/sepsis. Status post placement of a biliary drain in interventional radiology 10/16/2016. Status post ERCP 10/26/2016 with biliary stent exchange. There was no bile drainage. Status post cholangiogram and exchange of biliary drain 11/07/2016.Biliary drain capped 11/12/2016.    Disposition:  Mr. Adan completed a cycle of FOLFIRI 01/07/2017. The  chemotherapy was complicated by prolonged nausea and malaise. His performance status has improved over the past several days. We decided to delay the next cycle of chemotherapy until 01/28/2017. I suspect his symptoms were related  to irinotecan. The irinotecan will be dose reduced beginning with the next cycle of chemotherapy. I am encouraged the pruritus has improved and the bilirubin has normalized.  He will be scheduled for office visit and chemotherapy 02/11/2017.  25 minutes were spent with the patient today. The majority of the time was used for counseling and coordination of care.  Jacob Coder, MD  01/21/2017  10:38 AM

## 2017-01-22 LAB — CANCER ANTIGEN 19-9

## 2017-01-26 ENCOUNTER — Other Ambulatory Visit: Payer: Self-pay | Admitting: Oncology

## 2017-01-28 ENCOUNTER — Ambulatory Visit (HOSPITAL_BASED_OUTPATIENT_CLINIC_OR_DEPARTMENT_OTHER): Payer: Medicare Other

## 2017-01-28 ENCOUNTER — Other Ambulatory Visit (HOSPITAL_BASED_OUTPATIENT_CLINIC_OR_DEPARTMENT_OTHER): Payer: Medicare Other

## 2017-01-28 ENCOUNTER — Other Ambulatory Visit: Payer: Medicare Other

## 2017-01-28 VITALS — BP 144/80 | HR 69 | Temp 98.5°F | Resp 16

## 2017-01-28 DIAGNOSIS — C250 Malignant neoplasm of head of pancreas: Secondary | ICD-10-CM

## 2017-01-28 DIAGNOSIS — Z5111 Encounter for antineoplastic chemotherapy: Secondary | ICD-10-CM | POA: Diagnosis not present

## 2017-01-28 LAB — CBC WITH DIFFERENTIAL/PLATELET
BASO%: 1 % (ref 0.0–2.0)
Basophils Absolute: 0 10*3/uL (ref 0.0–0.1)
EOS%: 4.3 % (ref 0.0–7.0)
Eosinophils Absolute: 0.2 10*3/uL (ref 0.0–0.5)
HEMATOCRIT: 29.3 % — AB (ref 38.4–49.9)
HEMOGLOBIN: 9.9 g/dL — AB (ref 13.0–17.1)
LYMPH#: 0.7 10*3/uL — AB (ref 0.9–3.3)
LYMPH%: 18 % (ref 14.0–49.0)
MCH: 29.9 pg (ref 27.2–33.4)
MCHC: 33.6 g/dL (ref 32.0–36.0)
MCV: 88.8 fL (ref 79.3–98.0)
MONO#: 0.6 10*3/uL (ref 0.1–0.9)
MONO%: 15.8 % — ABNORMAL HIGH (ref 0.0–14.0)
NEUT%: 60.9 % (ref 39.0–75.0)
NEUTROS ABS: 2.2 10*3/uL (ref 1.5–6.5)
PLATELETS: 196 10*3/uL (ref 140–400)
RBC: 3.3 10*6/uL — ABNORMAL LOW (ref 4.20–5.82)
RDW: 15.4 % — AB (ref 11.0–14.6)
WBC: 3.6 10*3/uL — AB (ref 4.0–10.3)

## 2017-01-28 LAB — COMPREHENSIVE METABOLIC PANEL
ALBUMIN: 2.7 g/dL — AB (ref 3.5–5.0)
ALT: 66 U/L — AB (ref 0–55)
ANION GAP: 11 meq/L (ref 3–11)
AST: 48 U/L — AB (ref 5–34)
BILIRUBIN TOTAL: 1.54 mg/dL — AB (ref 0.20–1.20)
BUN: 9.7 mg/dL (ref 7.0–26.0)
CALCIUM: 9.2 mg/dL (ref 8.4–10.4)
CO2: 26 mEq/L (ref 22–29)
CREATININE: 0.7 mg/dL (ref 0.7–1.3)
Chloride: 101 mEq/L (ref 98–109)
EGFR: >90 mL/min/{1.73_m2} (ref >90)
Glucose: 238 mg/dl — ABNORMAL HIGH (ref 70–140)
Potassium: 3.4 mEq/L — ABNORMAL LOW (ref 3.5–5.1)
Sodium: 138 mEq/L (ref 136–145)
TOTAL PROTEIN: 6.6 g/dL (ref 6.4–8.3)

## 2017-01-28 MED ORDER — SODIUM CHLORIDE 0.9 % IV SOLN
2400.0000 mg/m2 | INTRAVENOUS | Status: DC
  Administered 2017-01-28: 4450 mg via INTRAVENOUS
  Filled 2017-01-28: qty 89

## 2017-01-28 MED ORDER — SODIUM CHLORIDE 0.9 % IV SOLN
135.0000 mg/m2 | Freq: Once | INTRAVENOUS | Status: AC
  Administered 2017-01-28: 260 mg via INTRAVENOUS
  Filled 2017-01-28: qty 5

## 2017-01-28 MED ORDER — SODIUM CHLORIDE 0.9 % IV SOLN
Freq: Once | INTRAVENOUS | Status: AC
  Administered 2017-01-28: 09:00:00 via INTRAVENOUS

## 2017-01-28 MED ORDER — LEUCOVORIN CALCIUM INJECTION 350 MG
400.0000 mg/m2 | Freq: Once | INTRAMUSCULAR | Status: AC
  Administered 2017-01-28: 744 mg via INTRAVENOUS
  Filled 2017-01-28: qty 37.2

## 2017-01-28 MED ORDER — PALONOSETRON HCL INJECTION 0.25 MG/5ML
INTRAVENOUS | Status: AC
  Filled 2017-01-28: qty 5

## 2017-01-28 MED ORDER — SODIUM CHLORIDE 0.9% FLUSH
10.0000 mL | INTRAVENOUS | Status: DC | PRN
  Filled 2017-01-28: qty 10

## 2017-01-28 MED ORDER — SODIUM CHLORIDE 0.9 % IV SOLN
Freq: Once | INTRAVENOUS | Status: AC
  Administered 2017-01-28: 09:00:00 via INTRAVENOUS
  Filled 2017-01-28: qty 5

## 2017-01-28 MED ORDER — PALONOSETRON HCL INJECTION 0.25 MG/5ML
0.2500 mg | Freq: Once | INTRAVENOUS | Status: AC
  Administered 2017-01-28: 0.25 mg via INTRAVENOUS

## 2017-01-28 NOTE — Progress Notes (Signed)
Per Dr. [name] okay to treat today with total bilirubin of 1.54.

## 2017-01-28 NOTE — Patient Instructions (Signed)
[facility] Discharge Instructions for Patients Receiving Chemotherapy  Today you received the following chemotherapy agents:Irinotecan, Leucovorin and Adrucil.   To help prevent nausea and vomiting after your treatment, we encourage you to take your nausea medication as directed.    If you develop nausea and vomiting that is not controlled by your nausea medication, call the clinic.   BELOW ARE SYMPTOMS THAT SHOULD BE REPORTED IMMEDIATELY:  *FEVER GREATER THAN 100.5 F  *CHILLS WITH OR WITHOUT FEVER  NAUSEA AND VOMITING THAT IS NOT CONTROLLED WITH YOUR NAUSEA MEDICATION  *UNUSUAL SHORTNESS OF BREATH  *UNUSUAL BRUISING OR BLEEDING  TENDERNESS IN MOUTH AND THROAT WITH OR WITHOUT PRESENCE OF ULCERS  *URINARY PROBLEMS  *BOWEL PROBLEMS  UNUSUAL RASH Items with * indicate a potential emergency and should be followed up as soon as possible.  Feel free to call the clinic you have any questions or concerns. The clinic phone number is [phone].  Please show the CHEMO ALERT CARD at check-in to the Emergency Department and triage nurse.   

## 2017-01-30 ENCOUNTER — Ambulatory Visit (HOSPITAL_BASED_OUTPATIENT_CLINIC_OR_DEPARTMENT_OTHER): Payer: Medicare Other

## 2017-01-30 VITALS — BP 144/76 | HR 74 | Temp 98.8°F | Resp 18

## 2017-01-30 DIAGNOSIS — C250 Malignant neoplasm of head of pancreas: Secondary | ICD-10-CM | POA: Diagnosis not present

## 2017-01-30 DIAGNOSIS — Z452 Encounter for adjustment and management of vascular access device: Secondary | ICD-10-CM

## 2017-01-30 MED ORDER — HEPARIN SOD (PORK) LOCK FLUSH 100 UNIT/ML IV SOLN
500.0000 [IU] | Freq: Once | INTRAVENOUS | Status: AC | PRN
  Administered 2017-01-30: 500 [IU]
  Filled 2017-01-30: qty 5

## 2017-01-30 MED ORDER — SODIUM CHLORIDE 0.9% FLUSH
10.0000 mL | INTRAVENOUS | Status: DC | PRN
  Administered 2017-01-30: 10 mL
  Filled 2017-01-30: qty 10

## 2017-01-30 NOTE — Patient Instructions (Signed)
Implanted Port Home Guide An implanted port is a type of central line that is placed under the skin. Central lines are used to provide IV access when treatment or nutrition needs to be given through a person's veins. Implanted ports are used for long-term IV access. An implanted port may be placed because:  You need IV medicine that would be irritating to the small veins in your hands or arms.  You need long-term IV medicines, such as antibiotics.  You need IV nutrition for a long period.  You need frequent blood draws for lab tests.  You need dialysis.  Implanted ports are usually placed in the chest area, but they can also be placed in the upper arm, the abdomen, or the leg. An implanted port has two main parts:  Reservoir. The reservoir is round and will appear as a small, raised area under your skin. The reservoir is the part where a needle is inserted to give medicines or draw blood.  Catheter. The catheter is a thin, flexible tube that extends from the reservoir. The catheter is placed into a large vein. Medicine that is inserted into the reservoir goes into the catheter and then into the vein.  How will I care for my incision site? Do not get the incision site wet. Bathe or shower as directed by your health care provider. How is my port accessed? Special steps must be taken to access the port:  Before the port is accessed, a numbing cream can be placed on the skin. This helps numb the skin over the port site.  Your health care provider uses a sterile technique to access the port. ? Your health care provider must put on a mask and sterile gloves. ? The skin over your port is cleaned carefully with an antiseptic and allowed to dry. ? The port is gently pinched between sterile gloves, and a needle is inserted into the port.  Only "non-coring" port needles should be used to access the port. Once the port is accessed, a blood return should be checked. This helps ensure that the port  is in the vein and is not clogged.  If your port needs to remain accessed for a constant infusion, a clear (transparent) bandage will be placed over the needle site. The bandage and needle will need to be changed every week, or as directed by your health care provider.  Keep the bandage covering the needle clean and dry. Do not get it wet. Follow your health care provider's instructions on how to take a shower or bath while the port is accessed.  If your port does not need to stay accessed, no bandage is needed over the port.  What is flushing? Flushing helps keep the port from getting clogged. Follow your health care provider's instructions on how and when to flush the port. Ports are usually flushed with saline solution or a medicine called heparin. The need for flushing will depend on how the port is used.  If the port is used for intermittent medicines or blood draws, the port will need to be flushed: ? After medicines have been given. ? After blood has been drawn. ? As part of routine maintenance.  If a constant infusion is running, the port may not need to be flushed.  How long will my port stay implanted? The port can stay in for as long as your health care provider thinks it is needed. When it is time for the port to come out, surgery will be   done to remove it. The procedure is similar to the one performed when the port was put in. When should I seek immediate medical care? When you have an implanted port, you should seek immediate medical care if:  You notice a bad smell coming from the incision site.  You have swelling, redness, or drainage at the incision site.  You have more swelling or pain at the port site or the surrounding area.  You have a fever that is not controlled with medicine.  This information is not intended to replace advice given to you by your health care provider. Make sure you discuss any questions you have with your health care provider. Document  Released: 10/08/2005 Document Revised: 03/15/2016 Document Reviewed: 06/15/2013 Elsevier Interactive Patient Education  2017 [employer] Inc[employer]  

## 2017-02-07 ENCOUNTER — Other Ambulatory Visit: Payer: Self-pay | Admitting: *Deleted

## 2017-02-07 DIAGNOSIS — C250 Malignant neoplasm of head of pancreas: Secondary | ICD-10-CM

## 2017-02-07 MED ORDER — PANCRELIPASE (LIP-PROT-AMYL) 36000-114000 UNITS PO CPEP: 36000.0000 [IU] | ORAL_CAPSULE | Freq: Three times a day (TID) | ORAL | 1 refills | Status: DC

## 2017-02-09 ENCOUNTER — Emergency Department (HOSPITAL_COMMUNITY): Payer: Medicare Other

## 2017-02-09 ENCOUNTER — Other Ambulatory Visit: Payer: Self-pay

## 2017-02-09 ENCOUNTER — Encounter (HOSPITAL_COMMUNITY): Payer: Self-pay

## 2017-02-09 ENCOUNTER — Inpatient Hospital Stay (HOSPITAL_COMMUNITY)
Admission: EM | Admit: 2017-02-09 | Discharge: 2017-02-16 | DRG: 919 | Disposition: A | Discharge status: Home / Self Care | Payer: Medicare Other | Attending: Internal Medicine | Admitting: Internal Medicine

## 2017-02-09 DIAGNOSIS — E11649 Type 2 diabetes mellitus with hypoglycemia without coma: Secondary | ICD-10-CM | POA: Diagnosis present

## 2017-02-09 DIAGNOSIS — G2581 Restless legs syndrome: Secondary | ICD-10-CM | POA: Diagnosis present

## 2017-02-09 DIAGNOSIS — Z681 Body mass index (BMI) 19 or less, adult: Secondary | ICD-10-CM | POA: Diagnosis not present

## 2017-02-09 DIAGNOSIS — R7881 Bacteremia: Secondary | ICD-10-CM | POA: Diagnosis not present

## 2017-02-09 DIAGNOSIS — T85590A Other mechanical complication of bile duct prosthesis, initial encounter: Secondary | ICD-10-CM | POA: Diagnosis present

## 2017-02-09 DIAGNOSIS — Z794 Long term (current) use of insulin: Secondary | ICD-10-CM | POA: Diagnosis not present

## 2017-02-09 DIAGNOSIS — C250 Malignant neoplasm of head of pancreas: Secondary | ICD-10-CM | POA: Diagnosis present

## 2017-02-09 DIAGNOSIS — A4159 Other Gram-negative sepsis: Secondary | ICD-10-CM | POA: Diagnosis present

## 2017-02-09 DIAGNOSIS — A419 Sepsis, unspecified organism: Secondary | ICD-10-CM | POA: Diagnosis not present

## 2017-02-09 DIAGNOSIS — I10 Essential (primary) hypertension: Secondary | ICD-10-CM | POA: Diagnosis present

## 2017-02-09 DIAGNOSIS — E440 Moderate protein-calorie malnutrition: Secondary | ICD-10-CM | POA: Diagnosis present

## 2017-02-09 DIAGNOSIS — D638 Anemia in other chronic diseases classified elsewhere: Secondary | ICD-10-CM | POA: Diagnosis present

## 2017-02-09 DIAGNOSIS — Z981 Arthrodesis status: Secondary | ICD-10-CM

## 2017-02-09 DIAGNOSIS — E876 Hypokalemia: Secondary | ICD-10-CM | POA: Diagnosis present

## 2017-02-09 DIAGNOSIS — R5081 Fever presenting with conditions classified elsewhere: Secondary | ICD-10-CM | POA: Diagnosis not present

## 2017-02-09 DIAGNOSIS — B961 Klebsiella pneumoniae [K. pneumoniae] as the cause of diseases classified elsewhere: Secondary | ICD-10-CM | POA: Diagnosis present

## 2017-02-09 DIAGNOSIS — G620 Drug-induced polyneuropathy: Secondary | ICD-10-CM | POA: Diagnosis present

## 2017-02-09 DIAGNOSIS — Z885 Allergy status to narcotic agent status: Secondary | ICD-10-CM

## 2017-02-09 DIAGNOSIS — Y838 Other surgical procedures as the cause of abnormal reaction of the patient, or of later complication, without mention of misadventure at the time of the procedure: Secondary | ICD-10-CM | POA: Diagnosis present

## 2017-02-09 DIAGNOSIS — R911 Solitary pulmonary nodule: Secondary | ICD-10-CM | POA: Diagnosis present

## 2017-02-09 DIAGNOSIS — B964 Proteus (mirabilis) (morganii) as the cause of diseases classified elsewhere: Secondary | ICD-10-CM | POA: Diagnosis present

## 2017-02-09 DIAGNOSIS — Z833 Family history of diabetes mellitus: Secondary | ICD-10-CM

## 2017-02-09 DIAGNOSIS — E785 Hyperlipidemia, unspecified: Secondary | ICD-10-CM | POA: Diagnosis present

## 2017-02-09 DIAGNOSIS — A0472 Enterocolitis due to Clostridium difficile, not specified as recurrent: Secondary | ICD-10-CM | POA: Diagnosis not present

## 2017-02-09 DIAGNOSIS — K831 Obstruction of bile duct: Secondary | ICD-10-CM

## 2017-02-09 DIAGNOSIS — Z8249 Family history of ischemic heart disease and other diseases of the circulatory system: Secondary | ICD-10-CM | POA: Diagnosis not present

## 2017-02-09 DIAGNOSIS — K219 Gastro-esophageal reflux disease without esophagitis: Secondary | ICD-10-CM | POA: Diagnosis present

## 2017-02-09 DIAGNOSIS — Z66 Do not resuscitate: Secondary | ICD-10-CM | POA: Diagnosis present

## 2017-02-09 DIAGNOSIS — Z95828 Presence of other vascular implants and grafts: Secondary | ICD-10-CM

## 2017-02-09 DIAGNOSIS — Z85828 Personal history of other malignant neoplasm of skin: Secondary | ICD-10-CM

## 2017-02-09 DIAGNOSIS — E119 Type 2 diabetes mellitus without complications: Secondary | ICD-10-CM | POA: Diagnosis not present

## 2017-02-09 DIAGNOSIS — T451X5A Adverse effect of antineoplastic and immunosuppressive drugs, initial encounter: Secondary | ICD-10-CM | POA: Diagnosis present

## 2017-02-09 DIAGNOSIS — C787 Secondary malignant neoplasm of liver and intrahepatic bile duct: Secondary | ICD-10-CM | POA: Diagnosis present

## 2017-02-09 DIAGNOSIS — E8770 Fluid overload, unspecified: Secondary | ICD-10-CM | POA: Diagnosis present

## 2017-02-09 DIAGNOSIS — D6481 Anemia due to antineoplastic chemotherapy: Secondary | ICD-10-CM | POA: Diagnosis present

## 2017-02-09 DIAGNOSIS — E118 Type 2 diabetes mellitus with unspecified complications: Secondary | ICD-10-CM

## 2017-02-09 DIAGNOSIS — Z809 Family history of malignant neoplasm, unspecified: Secondary | ICD-10-CM

## 2017-02-09 DIAGNOSIS — R6520 Severe sepsis without septic shock: Secondary | ICD-10-CM | POA: Diagnosis present

## 2017-02-09 DIAGNOSIS — A4189 Other specified sepsis: Secondary | ICD-10-CM | POA: Diagnosis not present

## 2017-02-09 DIAGNOSIS — Z79899 Other long term (current) drug therapy: Secondary | ICD-10-CM

## 2017-02-09 DIAGNOSIS — Z8507 Personal history of malignant neoplasm of pancreas: Secondary | ICD-10-CM

## 2017-02-09 DIAGNOSIS — Z9689 Presence of other specified functional implants: Secondary | ICD-10-CM | POA: Diagnosis present

## 2017-02-09 LAB — COMPREHENSIVE METABOLIC PANEL
ALBUMIN: 2.4 g/dL — AB (ref 3.5–5.0)
ALT: 82 U/L — ABNORMAL HIGH (ref 17–63)
ANION GAP: 10 (ref 5–15)
AST: 102 U/L — ABNORMAL HIGH (ref 15–41)
Alkaline Phosphatase: 833 U/L — ABNORMAL HIGH (ref 38–126)
BUN: 18 mg/dL (ref 6–20)
CALCIUM: 8.1 mg/dL — AB (ref 8.9–10.3)
CHLORIDE: 101 mmol/L (ref 101–111)
CO2: 22 mmol/L (ref 22–32)
Creatinine, Ser: 1.05 mg/dL (ref 0.61–1.24)
GFR calc non Af Amer: >60 mL/min (ref >60)
GLUCOSE: 77 mg/dL (ref 65–99)
POTASSIUM: 3.4 mmol/L — AB (ref 3.5–5.1)
SODIUM: 133 mmol/L — AB (ref 135–145)
Total Bilirubin: 2.2 mg/dL — ABNORMAL HIGH (ref 0.3–1.2)
Total Protein: 5.7 g/dL — ABNORMAL LOW (ref 6.5–8.1)

## 2017-02-09 LAB — CBG MONITORING, ED
GLUCOSE-CAPILLARY: 93 mg/dL (ref 65–99)
Glucose-Capillary: 59 mg/dL — ABNORMAL LOW (ref 65–99)

## 2017-02-09 LAB — PROTIME-INR
INR: 1.01
Prothrombin Time: 13.3 seconds (ref 11.4–15.2)

## 2017-02-09 LAB — URINALYSIS, ROUTINE W REFLEX MICROSCOPIC
BILIRUBIN URINE: NEGATIVE
Bacteria, UA: NONE SEEN
Glucose, UA: NEGATIVE mg/dL
Ketones, ur: NEGATIVE mg/dL
LEUKOCYTES UA: NEGATIVE
NITRITE: NEGATIVE
PH: 5 (ref 5.0–8.0)
Protein, ur: NEGATIVE mg/dL
SPECIFIC GRAVITY, URINE: 1.01 (ref 1.005–1.030)
Squamous Epithelial / LPF: NONE SEEN

## 2017-02-09 LAB — CBC WITH DIFFERENTIAL/PLATELET
BASOS ABS: 0 10*3/uL (ref 0.0–0.1)
Basophils Relative: 0 %
EOS PCT: 1 %
Eosinophils Absolute: 0 10*3/uL (ref 0.0–0.7)
HCT: 24.4 % — ABNORMAL LOW (ref 39.0–52.0)
HEMOGLOBIN: 8.3 g/dL — AB (ref 13.0–17.0)
LYMPHS PCT: 4 %
Lymphs Abs: 0.2 10*3/uL — ABNORMAL LOW (ref 0.7–4.0)
MCH: 30.1 pg (ref 26.0–34.0)
MCHC: 34 g/dL (ref 30.0–36.0)
MCV: 88.4 fL (ref 78.0–100.0)
Monocytes Absolute: 0.1 10*3/uL (ref 0.1–1.0)
Monocytes Relative: 3 %
NEUTROS ABS: 3.6 10*3/uL (ref 1.7–7.7)
NEUTROS PCT: 92 %
PLATELETS: 175 10*3/uL (ref 150–400)
RBC: 2.76 MIL/uL — AB (ref 4.22–5.81)
RDW: 15.3 % (ref 11.5–15.5)
WBC: 3.9 10*3/uL — AB (ref 4.0–10.5)

## 2017-02-09 LAB — GLUCOSE, CAPILLARY
Glucose-Capillary: 213 mg/dL — ABNORMAL HIGH (ref 65–99)
Glucose-Capillary: 303 mg/dL — ABNORMAL HIGH (ref 65–99)

## 2017-02-09 LAB — I-STAT CG4 LACTIC ACID, ED
LACTIC ACID, VENOUS: 1.43 mmol/L (ref 0.5–1.9)
Lactic Acid, Venous: 3.14 mmol/L (ref 0.5–1.9)

## 2017-02-09 MED ORDER — SODIUM CHLORIDE 0.9 % IV BOLUS (SEPSIS)
500.0000 mL | Freq: Once | INTRAVENOUS | Status: AC
  Administered 2017-02-09: 500 mL via INTRAVENOUS

## 2017-02-09 MED ORDER — PANTOPRAZOLE SODIUM 40 MG PO TBEC
40.0000 mg | DELAYED_RELEASE_TABLET | Freq: Every day | ORAL | Status: DC
  Administered 2017-02-09 – 2017-02-14 (×6): 40 mg via ORAL
  Filled 2017-02-09 (×6): qty 1

## 2017-02-09 MED ORDER — ONDANSETRON HCL 4 MG PO TABS: 4.0000 mg | ORAL_TABLET | Freq: Four times a day (QID) | ORAL | Status: DC | PRN

## 2017-02-09 MED ORDER — POTASSIUM CHLORIDE CRYS ER 20 MEQ PO TBCR
40.0000 meq | EXTENDED_RELEASE_TABLET | Freq: Once | ORAL | Status: AC
Start: 2017-02-09 — End: 2017-02-09
  Administered 2017-02-09: 40 meq via ORAL
  Filled 2017-02-09: qty 2

## 2017-02-09 MED ORDER — PIPERACILLIN-TAZOBACTAM 3.375 G IVPB
3.3750 g | Freq: Three times a day (TID) | INTRAVENOUS | Status: DC
  Administered 2017-02-09 – 2017-02-10 (×3): 3.375 g via INTRAVENOUS
  Filled 2017-02-09 (×6): qty 50

## 2017-02-09 MED ORDER — POLYETHYLENE GLYCOL 3350 17 G PO PACK
17.0000 g | PACK | Freq: Every day | ORAL | Status: DC | PRN
  Administered 2017-02-13: 17 g via ORAL
  Filled 2017-02-09: qty 1

## 2017-02-09 MED ORDER — VANCOMYCIN HCL IN DEXTROSE 750-5 MG/150ML-% IV SOLN
750.0000 mg | Freq: Two times a day (BID) | INTRAVENOUS | Status: DC
  Administered 2017-02-09: 750 mg via INTRAVENOUS
  Filled 2017-02-09 (×3): qty 150

## 2017-02-09 MED ORDER — PANCRELIPASE (LIP-PROT-AMYL) 12000-38000 UNITS PO CPEP
36000.0000 [IU] | ORAL_CAPSULE | Freq: Three times a day (TID) | ORAL | Status: DC
  Administered 2017-02-09 – 2017-02-16 (×21): 36000 [IU] via ORAL
  Filled 2017-02-09 (×2): qty 3
  Filled 2017-02-09: qty 1
  Filled 2017-02-09 (×9): qty 3
  Filled 2017-02-09: qty 1
  Filled 2017-02-09 (×8): qty 3

## 2017-02-09 MED ORDER — ONDANSETRON HCL 4 MG/2ML IJ SOLN
4.0000 mg | Freq: Four times a day (QID) | INTRAMUSCULAR | Status: DC | PRN
  Administered 2017-02-12: 4 mg via INTRAVENOUS

## 2017-02-09 MED ORDER — ACETAMINOPHEN 500 MG PO TABS
1000.0000 mg | ORAL_TABLET | Freq: Four times a day (QID) | ORAL | Status: DC | PRN
  Administered 2017-02-10 – 2017-02-15 (×8): 1000 mg via ORAL
  Filled 2017-02-09 (×8): qty 2

## 2017-02-09 MED ORDER — INSULIN ASPART 100 UNIT/ML ~~LOC~~ SOLN
0.0000 [IU] | Freq: Every day | SUBCUTANEOUS | Status: DC
  Administered 2017-02-09: 4 [IU] via SUBCUTANEOUS

## 2017-02-09 MED ORDER — SODIUM CHLORIDE 0.9 % IV SOLN
INTRAVENOUS | Status: DC
  Administered 2017-02-09 – 2017-02-12 (×3): via INTRAVENOUS

## 2017-02-09 MED ORDER — PIPERACILLIN-TAZOBACTAM 3.375 G IVPB 30 MIN
3.3750 g | Freq: Once | INTRAVENOUS | Status: AC
  Administered 2017-02-09: 3.375 g via INTRAVENOUS
  Filled 2017-02-09: qty 50

## 2017-02-09 MED ORDER — INSULIN DETEMIR 100 UNIT/ML FLEXPEN: 30.0000 [IU] | PEN_INJECTOR | Freq: Every day | SUBCUTANEOUS | Status: DC

## 2017-02-09 MED ORDER — INSULIN ASPART 100 UNIT/ML ~~LOC~~ SOLN
0.0000 [IU] | Freq: Three times a day (TID) | SUBCUTANEOUS | Status: DC
  Administered 2017-02-09: 3 [IU] via SUBCUTANEOUS
  Administered 2017-02-10 (×2): 2 [IU] via SUBCUTANEOUS
  Administered 2017-02-10: 5 [IU] via SUBCUTANEOUS
  Administered 2017-02-11: 2 [IU] via SUBCUTANEOUS
  Administered 2017-02-11: 5 [IU] via SUBCUTANEOUS
  Administered 2017-02-12: 3 [IU] via SUBCUTANEOUS
  Administered 2017-02-12: 7 [IU] via SUBCUTANEOUS

## 2017-02-09 MED ORDER — LORATADINE 10 MG PO TABS
10.0000 mg | ORAL_TABLET | Freq: Every day | ORAL | Status: DC
  Administered 2017-02-09 – 2017-02-16 (×8): 10 mg via ORAL
  Filled 2017-02-09 (×8): qty 1

## 2017-02-09 MED ORDER — VANCOMYCIN HCL IN DEXTROSE 1-5 GM/200ML-% IV SOLN
1000.0000 mg | Freq: Once | INTRAVENOUS | Status: AC
  Administered 2017-02-09: 1000 mg via INTRAVENOUS
  Filled 2017-02-09: qty 200

## 2017-02-09 MED ORDER — ENOXAPARIN SODIUM 40 MG/0.4ML ~~LOC~~ SOLN
40.0000 mg | SUBCUTANEOUS | Status: DC
  Filled 2017-02-09 (×3): qty 0.4

## 2017-02-09 MED ORDER — ONDANSETRON HCL 4 MG PO TABS: 8.0000 mg | ORAL_TABLET | Freq: Four times a day (QID) | ORAL | Status: DC | PRN

## 2017-02-09 MED ORDER — ADULT MULTIVITAMIN W/MINERALS CH
1.0000 | ORAL_TABLET | Freq: Every day | ORAL | Status: DC
  Administered 2017-02-09 – 2017-02-16 (×8): 1 via ORAL
  Filled 2017-02-09 (×8): qty 1

## 2017-02-09 MED ORDER — SODIUM CHLORIDE 0.9 % IJ SOLN
INTRAMUSCULAR | Status: AC
  Filled 2017-02-09: qty 10

## 2017-02-09 NOTE — ED Notes (Signed)
Bed: ZV47 Expected date:  Expected time:  Means of arrival:  Comments: EMS 71 yo cancer/fever x 12 hours-temp 102/orthostatic changes-diaphoretic/ST-fluid bolus

## 2017-02-09 NOTE — ED Provider Notes (Signed)
[facility] DEPT Provider Note   CSN: [id number] Arrival date & time: 02/09/17  0719     History   Chief Complaint Chief Complaint  Patient presents with  . Code Sepsis  . Cancer    HPI Jacob Hardin is a 71 y.o. male.  Patient is a 71 year old male with past medical history of diabetes, hypertension, and pancreatic cancer currently undergoing chemotherapy. He presents today for evaluation of fever, chills, and Reiter's that started yesterday evening. According to the wife his fever is been up to 102. He denies any specific complaints such as cough, diarrhea, abdominal pain, dysuria.   The history is provided by the patient.  Fever   This is a new problem. The current episode started 6 to 12 hours ago. The problem occurs constantly. The problem has not changed since onset.The maximum temperature noted was 101 to 101.9 F. Pertinent negatives include no chest pain, no diarrhea, no vomiting, no congestion, no sore throat and no cough. He has tried nothing for the symptoms.    Past Medical History:  Diagnosis Date  . Elevated liver enzymes   . GERD (gastroesophageal reflux disease)   . Hyperlipidemia   . Hypertension    recently weight lost-no meds now-running low.  . Obstructive jaundice   . Oxalate nephropathy   . PONV (postoperative nausea and vomiting)   . Primary pancreatic adenocarcinoma (HCC)    mets to liver and duodenum  . Pruritus   . Restless legs   . Skin cancer    "burned off my face & head"  . Type II diabetes mellitus ([place])   . Weight loss     Patient Active Problem List   Diagnosis Date Noted  . Biliary obstruction   . Obstructive jaundice   . GERD (gastroesophageal reflux disease) 10/12/2016  . Hyperbilirubinemia 10/12/2016  . Hypokalemia 10/12/2016  . Port catheter in place 02/14/2016  . Malnutrition of moderate degree 01/21/2016  . Immunocompromised patient ([place])   . Viridans streptococci infection   . Bacteremia   . Central line  infection   . Metastatic cancer ([place])   . Sepsis ([place]) 01/17/2016  . SIRS (systemic inflammatory response syndrome) ([place]) 01/17/2016  . Cancer of head of pancreas ([place]) 11/24/2015  . Diabetes mellitus without complication ([place])   . Hyperlipidemia   . Hypertension     Past Surgical History:  Procedure Laterality Date  . ANTERIOR CERVICAL DECOMP/DISCECTOMY FUSION  1994   cervical-bone graft  . BACK SURGERY    . COLONOSCOPY    . DUODENAL STENT PLACEMENT N/A 11/22/2015   Procedure: DUODENAL STENT PLACEMENT;  Surgeon: Carol Ada, MD;  Location: [facility];  Service: Endoscopy;  Laterality: N/A;  . ERCP N/A 10/26/2016   Procedure: ENDOSCOPIC RETROGRADE CHOLANGIOPANCREATOGRAPHY (ERCP);  Surgeon: Carol Ada, MD;  Location: [facility] ENDOSCOPY;  Service: Endoscopy;  Laterality: N/A;  . ESOPHAGOGASTRODUODENOSCOPY N/A 11/18/2015   Procedure: ESOPHAGOGASTRODUODENOSCOPY (EGD);  Surgeon: Carol Ada, MD;  Location: [facility] ENDOSCOPY;  Service: Endoscopy;  Laterality: N/A;  . ESOPHAGOGASTRODUODENOSCOPY N/A 11/22/2015   Procedure: ESOPHAGOGASTRODUODENOSCOPY (EGD);  Surgeon: Carol Ada, MD;  Location: [facility] ENDOSCOPY;  Service: Endoscopy;  Laterality: N/A;  Uncovered duodenal stent placement.  Fluoroscopy required.  . ESOPHAGOGASTRODUODENOSCOPY N/A 10/14/2016   Procedure: ESOPHAGOGASTRODUODENOSCOPY (EGD);  Surgeon: Carol Ada, MD;  Location: [facility] ENDOSCOPY;  Service: Endoscopy;  Laterality: N/A;  . EUS  11/18/2015   Procedure: UPPER ENDOSCOPIC ULTRASOUND (EUS) LINEAR;  Surgeon: Carol Ada, MD;  Location: [facility];  Service: Endoscopy;;  . INGUINAL HERNIA  REPAIR Left   . IR GENERIC HISTORICAL  10/16/2016   IR BILIARY DRAIN PLACEMENT WITH CHOLANGIOGRAM 10/16/2016 Marybelle Killings, MD [facility]-INTERV RAD  . IR GENERIC HISTORICAL  11/07/2016   IR EXCHANGE BILIARY DRAIN 11/07/2016 Arne Cleveland, MD [facility]INTERV RAD  . IR GENERIC HISTORICAL  11/12/2016   IR PATIENT EVAL TECH 0-60 MINS 11/12/2016 Arne Cleveland, MD [facility]-INTERV RAD   . SHOULDER ARTHROSCOPY W/ ROTATOR CUFF REPAIR Left 2014  . TONSILLECTOMY    . WISDOM TOOTH EXTRACTION         Home Medications    Prior to Admission medications   Medication Sig Start Date End Date Taking? Authorizing Provider  acetaminophen (TYLENOL) 500 MG tablet Take 1,000 mg by mouth every 6 (six) hours as needed.    Historical Provider, MD  baclofen (LIORESAL) 10 MG tablet Take 5 mg by mouth 3 (three) times daily as needed (for hiccups).    Historical Provider, MD  BD PEN NEEDLE NANO U/F 32G X 4 MM MISC USE TO INJECT INSULIN TWICE A DAY 11/06/16   [name], MD  esomeprazole (NEXIUM) 40 MG capsule Take 40 mg by mouth daily.    Historical Provider, MD  HUMALOG KWIKPEN 100 UNIT/ML KiwkPen INJECT 0.04 MLS (4 UNITS TOTAL) INTO THE SKIN DAILY. 12/13/16   [name], MD  hydrOXYzine (ATARAX/VISTARIL) 25 MG tablet Take 1 tablet (25 mg total) by mouth every 8 (eight) hours as needed for itching. 12/06/16   Ladell Pier, MD  insulin aspart (NOVOLOG FLEXPEN) 100 UNIT/ML FlexPen Inject 4 Units into the skin daily before supper.    Historical Provider, MD  Insulin Detemir (LEVEMIR FLEXTOUCH) 100 UNIT/ML Pen Inject 40 Units into the skin daily.    Historical Provider, MD  LEVEMIR FLEXTOUCH 100 UNIT/ML Pen INJECT 20 UNITS SUBCUTANEOUSLY DAILY 11/09/16   [name], MD  lidocaine-prilocaine (EMLA) cream Apply 1 application topically as needed (prior to accessing port).    Historical Provider, MD  lipase/protease/amylase (CREON) 36000 UNITS CPEP capsule Take 1 capsule (36,000 Units total) by mouth 3 (three) times daily before meals. 02/07/17   Ladell Pier, MD  loperamide (IMODIUM) 2 MG capsule Take 2 mg by mouth as needed for diarrhea or loose stools.    Historical Provider, MD  loratadine (CLARITIN) 10 MG tablet Take 10 mg by mouth daily.     Historical Provider, MD  Multiple Vitamin (MULTIVITAMIN WITH MINERALS) TABS tablet Take 1 tablet by mouth daily.    Historical Provider,  MD  naltrexone (DEPADE) 50 MG tablet Take 25 mg by mouth daily. 12/24/16   Historical Provider, MD  ondansetron (ZOFRAN) 8 MG tablet Take 8 mg by mouth every 6 (six) hours as needed for nausea or vomiting.     Historical Provider, MD  polyethylene glycol (MIRALAX / GLYCOLAX) packet Take 17 g by mouth daily as needed for mild constipation.     Historical Provider, MD  prochlorperazine (COMPAZINE) 10 MG tablet Take 1 tablet (10 mg total) by mouth every 6 (six) hours as needed for nausea or vomiting. 12/02/15   Ladell Pier, MD  traMADol (ULTRAM) 50 MG tablet Take 1 tablet (50 mg total) by mouth every 6 (six) hours as needed. Patient not taking: Reported on 01/21/2017 12/31/16   Ladell Pier, MD    Family History Family History  Problem Relation Age of Onset  . Diabetes Mother   . Hypertension Mother   . Cancer Father     lung 49  .  Hypertension Father   . Cancer Brother     pancreatic   . Diabetes Brother   . Cancer Paternal Grandfather     colon  . Cancer Paternal Uncle     prostate    Social History Social History  Substance Use Topics  . Smoking status: Never Smoker  . Smokeless tobacco: Never Used  . Alcohol use No     Allergies   Codeine   Review of Systems Review of Systems  Constitutional: Positive for fever.  HENT: Negative for congestion and sore throat.   Respiratory: Negative for cough.   Cardiovascular: Negative for chest pain.  Gastrointestinal: Negative for diarrhea and vomiting.  All other systems reviewed and are negative.    Physical Exam Updated Vital Signs BP 104/62 (BP Location: Right Arm)   Pulse 98   Temp 98.4 F (36.9 C) (Oral)   Resp 18   Wt 142 lb (64.4 kg)   SpO2 98%   BMI 19.26 kg/m   Physical Exam  Constitutional: He is oriented to person, place, and time. He appears well-developed and well-nourished. No distress.  HENT:  Head: Normocephalic and atraumatic.  Mouth/Throat: Oropharynx is clear and moist.  Neck: Normal range of  motion. Neck supple.  Cardiovascular: Normal rate, regular rhythm and normal heart sounds.  Exam reveals no friction rub.   No murmur heard. Pulmonary/Chest: Effort normal and breath sounds normal. No respiratory distress. He has no wheezes. He has no rales.  Abdominal: Soft. Bowel sounds are normal. He exhibits no distension. There is no tenderness.  Musculoskeletal: Normal range of motion. He exhibits no edema.  Neurological: He is alert and oriented to person, place, and time. Coordination normal.  Skin: Skin is warm and dry. He is not diaphoretic. No erythema.  Nursing note and vitals reviewed.    ED Treatments / Results  Labs (all labs ordered are listed, but only abnormal results are displayed) Labs Reviewed  I-STAT CG4 LACTIC ACID, ED - Abnormal; Notable for the following:       Result Value   Lactic Acid, Venous 3.14 (*)    All other components within normal limits  CULTURE, BLOOD (ROUTINE X 2)  CULTURE, BLOOD (ROUTINE X 2)  URINE CULTURE  COMPREHENSIVE METABOLIC PANEL  CBC WITH DIFFERENTIAL/PLATELET  PROTIME-INR  URINALYSIS, ROUTINE W REFLEX MICROSCOPIC    EKG  EKG Interpretation None       Radiology No results found.  Procedures Procedures (including critical care time)  Medications Ordered in ED Medications  piperacillin-tazobactam (ZOSYN) IVPB 3.375 g (not administered)  vancomycin (VANCOCIN) IVPB 1000 mg/200 mL premix (not administered)     Initial Impression / Assessment and Plan / ED Course  I have reviewed the triage vital signs and the nursing notes.  Pertinent labs & imaging results that were available during my care of the patient were reviewed by me and considered in my medical decision making (see chart for details).  Patient with history of pancreatic cancer undergoing chemotherapy. He presents today for fever up to 102 at home. Workup today reveals no obvious source, however initial lactate is 3.14. Cultures of blood are pending. His white  count is 3.9. He was given vancomycin and Zosyn and will be admitted for further treatment. Dr. Wynelle Cleveland agrees to admit.  Final Clinical Impressions(s) / ED Diagnoses   Final diagnoses:  None    New Prescriptions New Prescriptions   No medications on file     Veryl Speak, MD 02/09/17 1114

## 2017-02-09 NOTE — ED Notes (Signed)
He is sleeping soundly in the presence of his wife.

## 2017-02-09 NOTE — Progress Notes (Signed)
Pharmacy Antibiotic Note  Jacob Hardin is a 71 y.o. male admitted on 02/09/2017 with sepsis.  Pharmacy has been consulted for vancomycin/Zosyn dosing.  Pt on FOLFIRI regimen. Last infusion on 4/9  Pt with past medical history of diabetes, hypertension, and pancreatic cancer currently undergoing chemotherapy. According to the wife his fever is been up to 102. He denies any specific complaints such as cough, diarrhea, abdominal pain, dysuria.  Plan:  Zosyn 3.375g IV q8h (4 hour infusion).   Vancomycin 1000 mg IV x1, then 750 mg IV q12hr. Target trough 15-20 mcg/ml.  Monitor clinical course, renal function, cultures as available  Daily SCr ordered x 5 days   Weight: 142 lb (64.4 kg)  Temp (24hrs), Avg:99.1 F (37.3 C), Min:98.4 F (36.9 C), Max:99.8 F (37.7 C)   Recent Labs Lab 02/09/17 0737  LATICACIDVEN 3.14*    Estimated Creatinine Clearance: 79.4 mL/min (by C-G formula based on SCr of 0.7 mg/dL).    Allergies  Allergen Reactions  . Codeine Nausea Only    Antimicrobials this admission: 4/21 vancomycin >>  4/21 Zosyn >>   Dose adjustments this admission: ---  Microbiology results: 4/21 BCx:    Thank you for allowing pharmacy to be a part of this patient's care.   Royetta Asal, PharmD, BCPS Pager [phone] 02/09/2017 8:48 AM

## 2017-02-09 NOTE — ED Notes (Signed)
Elevated Lactic reported to Dr. Randal Buba and Octavia Bruckner RN

## 2017-02-09 NOTE — ED Notes (Signed)
He is awake, alert and in no distress and is eating his lunch with alacrity with assistance from his wife.

## 2017-02-09 NOTE — ED Notes (Signed)
Note: Pt. Had received a liter of IVF from EMS; I had also given a second liter per v.o. Dr. Stark Jock. Additional orders rec'd. At this time.

## 2017-02-09 NOTE — ED Triage Notes (Signed)
His wife tells [place] pt. Is a pancreatic cancer pt. Who has had fever since yesterday evening. He hit an all time high of 105 last night--he was 102F this morning. He is awake, alert and in no distress.

## 2017-02-09 NOTE — H&P (Addendum)
History and Physical    Jacob Hardin UQJ:[id number] DOB: [birth year] DOA: 02/09/2017    PCP: [name], MD  Patient coming from: home  Chief Complaint: fever  HPI: Jacob Hardin is a 71 y.o. male with medical history of stage 4 pancreatic cancer undergoing chemo who has been feeling fatigued for the past 2-3 days and developed shaking chills and a fever of 102 around 4-5 AM today.  No other fever prior to this.  He has no nausea, vomiting or abdominal pain. He had 2 loose stools 2 days ago and then took Imodium. No blood in stool.  No flu like symptoms or cough. No dysuria or increased frequency or urgency. Last urinated at 6 AM. No headaches, tooth ache or rash. He does have a biliary drain which his wife flushed yesterday. No abnormality was noted. No pain in port a cath site.   ED Course:  SBP in 80-90s, temp 99.8 CXR clear Given 2 L NS, Vanc and Zosyn in ER. Has no urge to urinate yet.   Review of Systems:  Has had about 8 lb weight loss since Christmas. No appetitie. Bruises easily. All other systems reviewed and apart from HPI, are negative.  Past Medical History:  Diagnosis Date  . Elevated liver enzymes   . GERD (gastroesophageal reflux disease)   . Hyperlipidemia   . Hypertension    recently weight lost-no meds now-running low.  . Obstructive jaundice   . Oxalate nephropathy   . PONV (postoperative nausea and vomiting)   . Primary pancreatic adenocarcinoma (HCC)    mets to liver and duodenum  . Pruritus   . Restless legs   . Skin cancer    "burned off my face & head"  . Type II diabetes mellitus ([place])   . Weight loss     Past Surgical History:  Procedure Laterality Date  . ANTERIOR CERVICAL DECOMP/DISCECTOMY FUSION  1994   cervical-bone graft  . BACK SURGERY    . COLONOSCOPY    . DUODENAL STENT PLACEMENT N/A 11/22/2015   Procedure: DUODENAL STENT PLACEMENT;  Surgeon: Carol Ada, MD;  Location: [facility];  Service: Endoscopy;   Laterality: N/A;  . ERCP N/A 10/26/2016   Procedure: ENDOSCOPIC RETROGRADE CHOLANGIOPANCREATOGRAPHY (ERCP);  Surgeon: Carol Ada, MD;  Location: [facility] ENDOSCOPY;  Service: Endoscopy;  Laterality: N/A;  . ESOPHAGOGASTRODUODENOSCOPY N/A 11/18/2015   Procedure: ESOPHAGOGASTRODUODENOSCOPY (EGD);  Surgeon: Carol Ada, MD;  Location: [facility] ENDOSCOPY;  Service: Endoscopy;  Laterality: N/A;  . ESOPHAGOGASTRODUODENOSCOPY N/A 11/22/2015   Procedure: ESOPHAGOGASTRODUODENOSCOPY (EGD);  Surgeon: Carol Ada, MD;  Location: [facility] ENDOSCOPY;  Service: Endoscopy;  Laterality: N/A;  Uncovered duodenal stent placement.  Fluoroscopy required.  . ESOPHAGOGASTRODUODENOSCOPY N/A 10/14/2016   Procedure: ESOPHAGOGASTRODUODENOSCOPY (EGD);  Surgeon: Carol Ada, MD;  Location: [facility] ENDOSCOPY;  Service: Endoscopy;  Laterality: N/A;  . EUS  11/18/2015   Procedure: UPPER ENDOSCOPIC ULTRASOUND (EUS) LINEAR;  Surgeon: Carol Ada, MD;  Location: [facility];  Service: Endoscopy;;  . INGUINAL HERNIA REPAIR Left   . IR GENERIC HISTORICAL  10/16/2016   IR BILIARY DRAIN PLACEMENT WITH CHOLANGIOGRAM 10/16/2016 Marybelle Killings, MD [facility]INTER[facility] RAD  . IR GENERIC HISTORICAL  11/07/2016   IR EXCHANGE BILIARY DRAIN 11/07/2016 Arne Cleveland, MD [facility]  . IR GENERIC HISTORICAL  11/12/2016   IR PATIENT EVAL TECH 0-60 MINS 11/12/2016 Arne Cleveland, MD [facility]  . SHOULDER ARTHROSCOPY W/ ROTATOR CUFF REPAIR Left 2014  . TONSILLECTOMY    . WISDOM TOOTH EXTRACTION  Social History:   reports that he has never smoked. He has never used smokeless tobacco. He reports that he does not drink alcohol or use drugs.  Lives with his wife, able to perform ADLs and does yard work when feeling well.   Allergies  Allergen Reactions  . Codeine Nausea Only    Family History  Problem Relation Age of Onset  . Diabetes Mother   . Hypertension Mother   . Cancer Father     lung 19  . Hypertension Father   . Cancer Brother     pancreatic   .  Diabetes Brother   . Cancer Paternal Grandfather     colon  . Cancer Paternal Uncle     prostate     Prior to Admission medications   Medication Sig Start Date End Date Taking? Authorizing Provider  acetaminophen (TYLENOL) 500 MG tablet Take 1,000 mg by mouth every 6 (six) hours as needed.   Yes Historical Provider, MD  baclofen (LIORESAL) 10 MG tablet Take 5 mg by mouth 3 (three) times daily as needed (for hiccups).   Yes Historical Provider, MD  esomeprazole (NEXIUM) 40 MG capsule Take 40 mg by mouth daily.   Yes Historical Provider, MD  hydrOXYzine (ATARAX/VISTARIL) 25 MG tablet Take 1 tablet (25 mg total) by mouth every 8 (eight) hours as needed for itching. 12/06/16  Yes Ladell Pier, MD  insulin aspart (NOVOLOG FLEXPEN) 100 UNIT/ML FlexPen Inject 4 Units into the skin daily before supper.   Yes Historical Provider, MD  Insulin Detemir (LEVEMIR FLEXTOUCH) 100 UNIT/ML Pen Inject 40 Units into the skin daily.   Yes Historical Provider, MD  lidocaine-prilocaine (EMLA) cream Apply 1 application topically as needed (prior to accessing port).   Yes Historical Provider, MD  lipase/protease/amylase (CREON) 36000 UNITS CPEP capsule Take 1 capsule (36,000 Units total) by mouth 3 (three) times daily before meals. 02/07/17  Yes Ladell Pier, MD  loperamide (IMODIUM) 2 MG capsule Take 2 mg by mouth as needed for diarrhea or loose stools.   Yes Historical Provider, MD  loratadine (CLARITIN) 10 MG tablet Take 10 mg by mouth daily.    Yes Historical Provider, MD  Multiple Vitamin (MULTIVITAMIN WITH MINERALS) TABS tablet Take 1 tablet by mouth daily.   Yes Historical Provider, MD  ondansetron (ZOFRAN) 8 MG tablet Take 8 mg by mouth every 6 (six) hours as needed for nausea or vomiting.    Yes Historical Provider, MD  polyethylene glycol (MIRALAX / GLYCOLAX) packet Take 17 g by mouth daily as needed for mild constipation.    Yes Historical Provider, MD  prochlorperazine (COMPAZINE) 10 MG tablet Take 1  tablet (10 mg total) by mouth every 6 (six) hours as needed for nausea or vomiting. 12/02/15  Yes Ladell Pier, MD  BD PEN NEEDLE NANO U/F 32G X 4 MM MISC USE TO INJECT INSULIN TWICE A DAY 11/06/16   [name], MD  traMADol (ULTRAM) 50 MG tablet Take 1 tablet (50 mg total) by mouth every 6 (six) hours as needed. Patient not taking: Reported on 01/21/2017 12/31/16   Ladell Pier, MD    Physical Exam: Wt Readings from Last 3 Encounters:  02/09/17 64.4 kg (142 lb)  01/21/17 67.8 kg (149 lb 6.4 oz)  12/31/16 68.4 kg (150 lb 12.8 oz)   Vitals:   02/09/17 0750 02/09/17 0800 02/09/17 0815 02/09/17 0830  BP:  (!) 97/59  (!) 90/56  Pulse:  93 92 94  Resp:  13  Temp:    99.8 F (37.7 C)  TempSrc:    Oral  SpO2:  94% 96% 95%  Weight: 64.4 kg (142 lb)         Constitutional: NAD, calm, comfortable Eyes: PERTLA, lids and conjunctivae normal ENMT: Mucous membranes are moist. Posterior pharynx clear of any exudate or lesions. Normal dentition.  Neck: normal, supple, no masses, no thyromegaly Respiratory: clear to auscultation bilaterally, no wheezing, no crackles. Normal respiratory effort. No accessory muscle use.  Cardiovascular: S1 & S2 heard, regular rate and rhythm, no murmurs / rubs / gallops. No extremity edema. 2+ pedal pulses. No carotid bruits.  Abdomen: No distension, no tenderness, no masses palpated. No hepatosplenomegaly. Bowel sounds normal. Biliary drain site appears clean Musculoskeletal: no clubbing / cyanosis. No joint deformity upper and lower extremities. Good ROM, no contractures. Normal muscle tone.  Skin: no rashes, lesions, ulcers. No induration- port a cath site is clean Neurologic: CN 2-12 grossly intact. Sensation intact, DTR normal. Strength 5/5 in all 4 limbs.  Psychiatric: Normal judgment and insight. Alert and oriented x 3. Normal mood.     Labs on Admission: I have personally reviewed following labs and imaging studies  CBC:  Recent Labs Lab  02/09/17 0751  WBC 3.9*  NEUTROABS 3.6  HGB 8.3*  HCT 24.4*  MCV 88.4  PLT 062   Basic Metabolic Panel:  Recent Labs Lab 02/09/17 0751  NA 133*  K 3.4*  CL 101  CO2 22  GLUCOSE 77  BUN 18  CREATININE 1.05  CALCIUM 8.1*   GFR: Estimated Creatinine Clearance: 60.5 mL/min (by C-G formula based on SCr of 1.05 mg/dL). Liver Function Tests:  Recent Labs Lab 02/09/17 0751  AST 102*  ALT 82*  ALKPHOS 833*  BILITOT 2.2*  PROT 5.7*  ALBUMIN 2.4*   No results for input(s): LIPASE, AMYLASE in the last 168 hours. No results for input(s): AMMONIA in the last 168 hours. Coagulation Profile:  Recent Labs Lab 02/09/17 0751  INR 1.01   Cardiac Enzymes: No results for input(s): CKTOTAL, CKMB, CKMBINDEX, TROPONINI in the last 168 hours. BNP (last 3 results) No results for input(s): PROBNP in the last 8760 hours. HbA1C: No results for input(s): HGBA1C in the last 72 hours. CBG: No results for input(s): GLUCAP in the last 168 hours. Lipid Profile: No results for input(s): CHOL, HDL, LDLCALC, TRIG, CHOLHDL, LDLDIRECT in the last 72 hours. Thyroid Function Tests: No results for input(s): TSH, T4TOTAL, FREET4, T3FREE, THYROIDAB in the last 72 hours. Anemia Panel: No results for input(s): VITAMINB12, FOLATE, FERRITIN, TIBC, IRON, RETICCTPCT in the last 72 hours. Urine analysis:    Component Value Date/Time   COLORURINE AMBER (A) 10/12/2016 1540   APPEARANCEUR CLEAR 10/12/2016 1540   LABSPEC 1.035 (H) 10/12/2016 1540   PHURINE 5.0 10/12/2016 1540   GLUCOSEU >=500 (A) 10/12/2016 1540   HGBUR SMALL (A) 10/12/2016 1540   BILIRUBINUR MODERATE (A) 10/12/2016 1540   KETONESUR NEGATIVE 10/12/2016 1540   PROTEINUR 30 (A) 10/12/2016 1540   NITRITE NEGATIVE 10/12/2016 1540   LEUKOCYTESUR NEGATIVE 10/12/2016 1540   Sepsis Labs: @LABRCNTIP (procalcitonin:4,lacticidven:4) )No results found for this or any previous visit (from the past 240 hour(s)).   Radiological Exams on  Admission: Dg Chest 2 View  Result Date: 02/09/2017 CLINICAL DATA:  Pt c/o new onset fever last night, lethargy this week, hx pancreatic cancer, has been having treatment x 1.5years, last chemo x 3 wks ago. EXAM: CHEST  2 VIEW COMPARISON:  Chest  x-ray dated 10/12/2016. FINDINGS: Heart size and mediastinal contours are normal. Atherosclerotic changes noted at the aortic arch. Right chest wall Port-A-Cath appears stable in position. Dense nodule within the right upper lobe, presumed granuloma. No pleural effusion or pneumothorax seen. No acute or suspicious osseous finding. Mild degenerative spurring noted within the thoracic spine. IMPRESSION: 1. No active cardiopulmonary disease. No evidence of pneumonia or pulmonary edema. 2. **An incidental finding of potential clinical significance has been found. Dense pulmonary nodule within the right upper lobe, presumed benign granuloma, likely present on previous exams but better seen on today's study. As the density suggests benign granuloma, would merely recommend follow-up chest x-ray in 3-6 months to ensure stability.** 3. Aortic atherosclerosis. Electronically Signed   By: Franki Cabot M.D.   On: 02/09/2017 08:49    EKG: Independently reviewed. Sinus rhythm  Assessment/Plan Principal Problem:   Severe sepsis with no etiology as of yet - most recent sBP in 70s after 2 NS boluses - admit to SDU - awaiting UA and U culture - some loose stool a couple of days ago but no other symptoms- abdominal exam is benign - send Biliary fluid for gram stain and culture - Vanc and Zosyn given  Active Problems:   Cancer of head of pancreas stage 4 - under care of Dr Ammie Dalton receiving chemo - drain and biliary stent placed by Dr [name] per patient - drain is usually flushed once a week - continue Creon with meals    DM2 (diabetes mellitus, type 2)  - takes 44 U of Levemir at breakfast and 4 U of Novolog with dinner - will place on 30 U of Levemir in AM and low  dose SSI     Port catheter in place  Pulmonary nodule RUL  - noted on Xray ans suspected to be a granuloma- f/u in 3-6 mo  DVT prophylaxis: Lovenox  Code Status: DNR  Family Communication: wife at bedside  Disposition Plan: SDU   Consults called: Dr Ammie Dalton placed under treatment team  Admission status: inpatient   Addendum: BP improved to low 90s, patient has voided twice, repeat Lactic acid is normal. RN asking if his bed can be downgraded. Ok to go to [employer].    Debbe Odea MD [facility] Pager: [email or web address] Password [email or web address] [phone], please contact night-coverage   02/09/2017, 10:10 AM

## 2017-02-09 NOTE — ED Notes (Signed)
I have just phoned report to Maudie Mercury, RN on [address], and [facility] is transporting him now.  He remains in no distress. He has stood to void twice on our unit without difficulty.

## 2017-02-10 DIAGNOSIS — E440 Moderate protein-calorie malnutrition: Secondary | ICD-10-CM

## 2017-02-10 LAB — URINE CULTURE: CULTURE: NO GROWTH

## 2017-02-10 LAB — CBC
HCT: 22.6 % — ABNORMAL LOW (ref 39.0–52.0)
Hemoglobin: 7.6 g/dL — ABNORMAL LOW (ref 13.0–17.0)
MCH: 29.9 pg (ref 26.0–34.0)
MCHC: 33.6 g/dL (ref 30.0–36.0)
MCV: 89 fL (ref 78.0–100.0)
PLATELETS: 168 10*3/uL (ref 150–400)
RBC: 2.54 MIL/uL — ABNORMAL LOW (ref 4.22–5.81)
RDW: 15.9 % — AB (ref 11.5–15.5)
WBC: 6.9 10*3/uL (ref 4.0–10.5)

## 2017-02-10 LAB — FOLATE: FOLATE: 21.1 ng/mL (ref >5.9)

## 2017-02-10 LAB — BLOOD CULTURE ID PANEL (REFLEXED)
ACINETOBACTER BAUMANNII: NOT DETECTED
CANDIDA ALBICANS: NOT DETECTED
CANDIDA PARAPSILOSIS: NOT DETECTED
CANDIDA TROPICALIS: NOT DETECTED
CARBAPENEM RESISTANCE: NOT DETECTED
Candida glabrata: NOT DETECTED
Candida krusei: NOT DETECTED
ENTEROBACTER CLOACAE COMPLEX: NOT DETECTED
ENTEROBACTERIACEAE SPECIES: DETECTED — AB
Enterococcus species: NOT DETECTED
Escherichia coli: NOT DETECTED
HAEMOPHILUS INFLUENZAE: NOT DETECTED
Klebsiella oxytoca: NOT DETECTED
Klebsiella pneumoniae: DETECTED — AB
Listeria monocytogenes: NOT DETECTED
NEISSERIA MENINGITIDIS: NOT DETECTED
Proteus species: NOT DETECTED
Pseudomonas aeruginosa: NOT DETECTED
STAPHYLOCOCCUS AUREUS BCID: NOT DETECTED
STREPTOCOCCUS AGALACTIAE: NOT DETECTED
STREPTOCOCCUS SPECIES: NOT DETECTED
Serratia marcescens: NOT DETECTED
Staphylococcus species: NOT DETECTED
Streptococcus pneumoniae: NOT DETECTED
Streptococcus pyogenes: NOT DETECTED

## 2017-02-10 LAB — PREPARE RBC (CROSSMATCH)

## 2017-02-10 LAB — IRON AND TIBC
IRON: 7 ug/dL — AB (ref 45–182)
SATURATION RATIOS: 3 % — AB (ref 17.9–39.5)
TIBC: 206 ug/dL — ABNORMAL LOW (ref 250–450)
UIBC: 199 ug/dL

## 2017-02-10 LAB — GLUCOSE, CAPILLARY
GLUCOSE-CAPILLARY: 264 mg/dL — AB (ref 65–99)
GLUCOSE-CAPILLARY: 177 mg/dL — AB (ref 65–99)
Glucose-Capillary: 199 mg/dL — ABNORMAL HIGH (ref 65–99)
Glucose-Capillary: 145 mg/dL — ABNORMAL HIGH (ref 65–99)

## 2017-02-10 LAB — COMPREHENSIVE METABOLIC PANEL
ALK PHOS: 569 U/L — AB (ref 38–126)
ALT: 64 U/L — AB (ref 17–63)
AST: 61 U/L — AB (ref 15–41)
Albumin: 2 g/dL — ABNORMAL LOW (ref 3.5–5.0)
Anion gap: 5 (ref 5–15)
BILIRUBIN TOTAL: 1.3 mg/dL — AB (ref 0.3–1.2)
BUN: 16 mg/dL (ref 6–20)
CALCIUM: 7.8 mg/dL — AB (ref 8.9–10.3)
CO2: 22 mmol/L (ref 22–32)
CREATININE: 0.79 mg/dL (ref 0.61–1.24)
Chloride: 106 mmol/L (ref 101–111)
GFR calc Af Amer: >60 mL/min (ref >60)
GLUCOSE: 246 mg/dL — AB (ref 65–99)
Potassium: 4.3 mmol/L (ref 3.5–5.1)
SODIUM: 133 mmol/L — AB (ref 135–145)
TOTAL PROTEIN: 5.1 g/dL — AB (ref 6.5–8.1)

## 2017-02-10 LAB — RETICULOCYTES
RBC.: 2.83 MIL/uL — ABNORMAL LOW (ref 4.22–5.81)
RETIC COUNT ABSOLUTE: 34 10*3/uL (ref 19.0–186.0)
Retic Ct Pct: 1.2 % (ref 0.4–3.1)

## 2017-02-10 LAB — ABO/RH: ABO/RH(D): A POS

## 2017-02-10 LAB — FERRITIN: FERRITIN: 252 ng/mL (ref 24–336)

## 2017-02-10 LAB — VITAMIN B12: Vitamin B-12: 3261 pg/mL — ABNORMAL HIGH (ref 180–914)

## 2017-02-10 MED ORDER — INSULIN DETEMIR 100 UNIT/ML ~~LOC~~ SOLN
40.0000 [IU] | Freq: Every day | SUBCUTANEOUS | Status: DC
  Administered 2017-02-10: 40 [IU] via SUBCUTANEOUS
  Filled 2017-02-10 (×2): qty 0.4

## 2017-02-10 MED ORDER — LOPERAMIDE HCL 2 MG PO CAPS: 2.0000 mg | ORAL_CAPSULE | ORAL | Status: DC | PRN

## 2017-02-10 MED ORDER — HYDROXYZINE HCL 25 MG PO TABS
25.0000 mg | ORAL_TABLET | Freq: Three times a day (TID) | ORAL | Status: DC | PRN
  Administered 2017-02-10: 25 mg via ORAL
  Filled 2017-02-10: qty 1

## 2017-02-10 MED ORDER — SODIUM CHLORIDE 0.9 % IV SOLN: Freq: Once | INTRAVENOUS | Status: DC

## 2017-02-10 MED ORDER — POLYVINYL ALCOHOL 1.4 % OP SOLN
1.0000 [drp] | OPHTHALMIC | Status: DC | PRN
  Filled 2017-02-10: qty 15

## 2017-02-10 MED ORDER — PROCHLORPERAZINE MALEATE 10 MG PO TABS: 10.0000 mg | ORAL_TABLET | Freq: Four times a day (QID) | ORAL | Status: DC | PRN

## 2017-02-10 MED ORDER — CEFTRIAXONE SODIUM 2 G IJ SOLR
2.0000 g | INTRAMUSCULAR | Status: DC
  Administered 2017-02-10 – 2017-02-14 (×5): 2 g via INTRAVENOUS
  Filled 2017-02-10 (×6): qty 2

## 2017-02-10 MED ORDER — SALINE SPRAY 0.65 % NA SOLN
1.0000 | NASAL | Status: DC | PRN
  Filled 2017-02-10: qty 44

## 2017-02-10 NOTE — Progress Notes (Signed)
PROGRESS NOTE  Jacob Hardin  WUX:[id number] DOB: May 31, 1946 DOA: 02/09/2017 PCP: [name], MD  Outpatient Specialists: Dr. [name], GI Dr. 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 He had no other significant symptoms, specifically no abdominal pain, nausea, vomiting, cough, dyspnea, chest pain, urinary complaints, or wounds. Biliary drain has been flushed weekly by his wife with no reported complications. On arrival, he was febrile and hypotensive with clear CXR, started on broad-spectrum antibiotics and given IV fluids which improved blood pressure. Repeat lactate normalized and he was admitted to [facility] for sepsis with uncertain source. Blood cultures have grown Klebsiella in 2 of 2, so antibiotics were narrowed to ceftriaxone pending sensitivity data.   Assessment & Plan: Principal Problem:   Severe sepsis ([place]) Active Problems:   Cancer of head of pancreas ([place])   Sepsis ([place])   Malnutrition of moderate degree   Port catheter in place   DM2 (diabetes mellitus, type 2) ([place])   Pulmonary nodule, right  Sepsis due to Klebsiella bacteremia: Likely biliary source without significant LFT elevation from baseline. Lactate has normalized and vital signs are stable. UCx negative. CXR clear. Port site appears normal. Drain site with very mild erythema and no purulence.  - Transition vanc/zosyn > ceftriaxone per pharmacy pending sensitivity data.  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 [name]. Patient is on chemotherapy, next dose  due 4/23.  - Dr. [name] added to treatment team - No evidence of biliary obstruction at this time.  - Continue weekly drain flushes - Continue creon with meals. - Hydroxyzine prn itching  Anemia: Normocytic, acute on chronic. 8.3 > 7.6mg /dl here on baseline ~10mg /dl probably related to chemotherapy. Symptomatic with increasing fatigue.   - Will transfuse 1u PRBCs and monitor for bleeding.  - Anemia panel ordered prior to transfusion.   T2DM:Last A1c 7.0 on 09/19/15, well controlled. Patient is takingLevemirat home - Home levemir decreased 44u > 30u with sensitive SSI.  - Carb-modified diet.  Malnutrition of moderate degree: - Nutrition consult  Pulmonary nodule RUL: Noted on CXR at admission, suspected granuloma.  - Repeat imaging recommended in 3 - 6 months. Defer to Dr. [name].   DVT prophylaxis: Lovenox Code Status: DNR Family Communication: Wife at bedside Disposition Plan: Med-surg  Consultants:   Dr. [name] added to treatment team  Procedures:   1u PRBCs 4/22  Antimicrobials:  Vanc/zosyn 4/21 > 4/22  Ceftriaxone 4/22 >>    Subjective: Pt confirms above history, reports feeling significantly fatigued but denies bleeding. No chest pain or dyspnea. Had shaking chills this AM.  Objective: Vitals:   02/10/17 0208 02/10/17 0513 02/10/17 0840 02/10/17 0948  BP:  108/66    Pulse:  70    Resp:  18    Temp:  98.1 F (36.7 C) 99.4 F (37.4 C) 97.7 F (36.5 C)  TempSrc:  Oral Axillary   SpO2:  99%    Weight: 66.7 kg (147 lb)     Height:        Intake/Output Summary (Last 24 hours) at 02/10/17 1057 Last data filed at 02/10/17 0819  Gross per  24 hour  Intake          4164.16 ml  Output             1670 ml  Net          2494.16 ml   Filed Weights   02/09/17 0750 02/09/17 1600 02/10/17 0208  Weight: 64.4 kg (142 lb) 66.1 kg (145 lb 11.6 oz) 66.7 kg (147 lb)    Examination: General exam: Thin 71 y.o. male laying supine in no distress    Respiratory system: Non-labored breathing room air. Clear to auscultation bilaterally.  Cardiovascular system: Regular rate and rhythm. No murmur, rub, or gallop. No JVD, and no pedal edema. Gastrointestinal system: Abdomen soft, non-tender, non-distended, with normoactive bowel sounds. No organomegaly or masses felt. Central nervous system: Alert and oriented. No focal neurological deficits. Extremities: Warm, no deformities Skin: Port in right upper chest c/d/I without erythema. RUQ biliary drain site with <1cm erythema and no drainage/induration.  Psychiatry: Judgement and insight appear normal. Mood & affect appropriate.   Data Reviewed: I have personally reviewed following labs and imaging studies  CBC:  Recent Labs Lab 02/09/17 0751 02/10/17 0511  WBC 3.9* 6.9  NEUTROABS 3.6  --   HGB 8.3* 7.6*  HCT 24.4* 22.6*  MCV 88.4 89.0  PLT 175 102   Basic Metabolic Panel:  Recent Labs Lab 02/09/17 0751 02/10/17 0511  NA 133* 133*  K 3.4* 4.3  CL 101 106  CO2 22 22  GLUCOSE 77 246*  BUN 18 16  CREATININE 1.05 0.79  CALCIUM 8.1* 7.8*   GFR: Estimated Creatinine Clearance: 82.2 mL/min (by C-G formula based on SCr of 0.79 mg/dL). Liver Function Tests:  Recent Labs Lab 02/09/17 0751 02/10/17 0511  AST 102* 61*  ALT 82* 64*  ALKPHOS 833* 569*  BILITOT 2.2* 1.3*  PROT 5.7* 5.1*  ALBUMIN 2.4* 2.0*   No results for input(s): LIPASE, AMYLASE in the last 168 hours. No results for input(s): AMMONIA in the last 168 hours. Coagulation Profile:  Recent Labs Lab 02/09/17 0751  INR 1.01   Cardiac Enzymes: No results for input(s): CKTOTAL, CKMB, CKMBINDEX, TROPONINI in the last 168 hours. BNP (last 3 results) No results for input(s): PROBNP in the last 8760 hours. HbA1C: No results for input(s): HGBA1C in the last 72 hours. CBG:  Recent Labs Lab 02/09/17 1207 02/09/17 1257 02/09/17 1742 02/09/17 2130 02/10/17 0729  GLUCAP 59* 93 213* 303* 199*   Lipid  Profile: No results for input(s): CHOL, HDL, LDLCALC, TRIG, CHOLHDL, LDLDIRECT in the last 72 hours. Thyroid Function Tests: No results for input(s): TSH, T4TOTAL, FREET4, T3FREE, THYROIDAB in the last 72 hours. Anemia Panel: No results for input(s): VITAMINB12, FOLATE, FERRITIN, TIBC, IRON, RETICCTPCT in the last 72 hours. Urine analysis:    Component Value Date/Time   COLORURINE YELLOW 02/09/2017 [place] 02/09/2017 0752   LABSPEC 1.010 02/09/2017 0752   PHURINE 5.0 02/09/2017 0752   GLUCOSEU NEGATIVE 02/09/2017 0752   HGBUR MODERATE (A) 02/09/2017 0752   BILIRUBINUR NEGATIVE 02/09/2017 0752   KETONESUR NEGATIVE 02/09/2017 0752   PROTEINUR NEGATIVE 02/09/2017 0752   NITRITE NEGATIVE 02/09/2017 0752   LEUKOCYTESUR NEGATIVE 02/09/2017 0752   Recent Results (from the past 240 hour(s))  Culture, blood (Routine x 2)     Status: None (Preliminary result)   Collection Time: 02/09/17  7:30 AM  Result Value Ref Range Status   Specimen Description BLOOD BLOOD RIGHT FOREARM  Final   Special  Requests   Final    BOTTLES DRAWN AEROBIC AND ANAEROBIC Blood Culture adequate volume   Culture  Setup Time   Final    GRAM NEGATIVE RODS ANAEROBIC BOTTLE ONLY CRITICAL RESULT CALLED TO, READ BACK BY AND VERIFIED WITH: N GLOGOVAC,PHARMD AT 3664 02/10/17 BY L BENFIELD Performed at [facility], [place] [address] [place], [place] [address]    Culture GRAM NEGATIVE RODS  Final   Report Status PENDING  Incomplete  Blood Culture ID Panel (Reflexed)     Status: Abnormal   Collection Time: 02/09/17  7:30 AM  Result Value Ref Range Status   Enterococcus species NOT DETECTED NOT DETECTED Final   Listeria monocytogenes NOT DETECTED NOT DETECTED Final   Staphylococcus species NOT DETECTED NOT DETECTED Final   Staphylococcus aureus NOT DETECTED NOT DETECTED Final   Streptococcus species NOT DETECTED NOT DETECTED Final   Streptococcus agalactiae NOT DETECTED NOT DETECTED Final    Streptococcus pneumoniae NOT DETECTED NOT DETECTED Final   Streptococcus pyogenes NOT DETECTED NOT DETECTED Final   Acinetobacter baumannii NOT DETECTED NOT DETECTED Final   Enterobacteriaceae species DETECTED (A) NOT DETECTED Final    Comment: Enterobacteriaceae represent a large family of gram-negative bacteria, not a single organism. CRITICAL RESULT CALLED TO, READ BACK BY AND VERIFIED WITH: N GLOGOVAC,PHARMD AT 0931 02/10/17 BY L BENFIELD    Enterobacter cloacae complex NOT DETECTED NOT DETECTED Final   Escherichia coli NOT DETECTED NOT DETECTED Final   Klebsiella oxytoca NOT DETECTED NOT DETECTED Final   Klebsiella pneumoniae DETECTED (A) NOT DETECTED Final    Comment: CRITICAL RESULT CALLED TO, READ BACK BY AND VERIFIED WITH: N GLOGOVAC,PHARMD AT 0931 02/10/17 BY L BENFIELD    Proteus species NOT DETECTED NOT DETECTED Final   Serratia marcescens NOT DETECTED NOT DETECTED Final   Carbapenem resistance NOT DETECTED NOT DETECTED Final   Haemophilus influenzae NOT DETECTED NOT DETECTED Final   Neisseria meningitidis NOT DETECTED NOT DETECTED Final   Pseudomonas aeruginosa NOT DETECTED NOT DETECTED Final   Candida albicans NOT DETECTED NOT DETECTED Final   Candida glabrata NOT DETECTED NOT DETECTED Final   Candida krusei NOT DETECTED NOT DETECTED Final   Candida parapsilosis NOT DETECTED NOT DETECTED Final   Candida tropicalis NOT DETECTED NOT DETECTED Final    Comment: Performed at [facility], [place]. [address], [place], [place] [address]  Culture, blood (Routine x 2)     Status: None (Preliminary result)   Collection Time: 02/09/17  7:40 AM  Result Value Ref Range Status   Specimen Description BLOOD LEFT ANTECUBITAL  Final   Special Requests   Final    Blood Culture results may not be optimal due to an excessive volume of blood received in culture bottles   Culture  Setup Time   Final    GRAM NEGATIVE RODS ANAEROBIC BOTTLE ONLY Performed at [facility], [place]  [address] [place], [place] [address]    Culture GRAM NEGATIVE RODS  Final   Report Status PENDING  Incomplete  Body fluid culture     Status: None (Preliminary result)   Collection Time: 02/09/17  3:25 PM  Result Value Ref Range Status   Specimen Description COMMON BILE DUCT  Final   Special Requests Immunocompromised  Final   Gram Stain   Final    ABUNDANT WBC PRESENT, PREDOMINANTLY PMN ABUNDANT GRAM NEGATIVE RODS ABUNDANT GRAM POSITIVE COCCI IN PAIRS IN CHAINS ABUNDANT GRAM POSITIVE RODS    Culture  Final    CULTURE REINCUBATED FOR BETTER GROWTH Performed at [facility], [place] [address], [place], [place] [address]    Report Status PENDING  Incomplete      Radiology Studies: Dg Chest 2 View  Result Date: 02/09/2017 CLINICAL DATA:  Pt c/o new onset fever last night, lethargy this week, hx pancreatic cancer, has been having treatment x 1.5years, last chemo x 3 wks ago. EXAM: CHEST  2 VIEW COMPARISON:  Chest x-ray dated 10/12/2016. FINDINGS: Heart size and mediastinal contours are normal. Atherosclerotic changes noted at the aortic arch. Right chest wall Port-A-Cath appears stable in position. Dense nodule within the right upper lobe, presumed granuloma. No pleural effusion or pneumothorax seen. No acute or suspicious osseous finding. Mild degenerative spurring noted within the thoracic spine. IMPRESSION: 1. No active cardiopulmonary disease. No evidence of pneumonia or pulmonary edema. 2. **An incidental finding of potential clinical significance has been found. Dense pulmonary nodule within the right upper lobe, presumed benign granuloma, likely present on previous exams but better seen on today's study. As the density suggests benign granuloma, would merely recommend follow-up chest x-ray in 3-6 months to ensure stability.** 3. Aortic atherosclerosis. Electronically Signed   By: Franki Cabot M.D.   On: 02/09/2017 08:49    Scheduled Meds: . enoxaparin (LOVENOX) injection  40 mg  Subcutaneous Q24H  . insulin aspart  0-5 Units Subcutaneous QHS  . insulin aspart  0-9 Units Subcutaneous TID WC  . lipase/protease/amylase  36,000 Units Oral TID AC  . loratadine  10 mg Oral Daily  . multivitamin with minerals  1 tablet Oral Daily  . pantoprazole  40 mg Oral Daily   Continuous Infusions: . sodium chloride 125 mL/hr at 02/10/17 0516  . cefTRIAXone (ROCEPHIN)  IV       LOS: 1 day   Time spent: 25 minutes.  Vance Gather, MD [facility] Pager [phone]  If 7PM-7AM, please contact night-coverage [email or web address] Password [facility] [place] 02/10/2017, 10:57 AM

## 2017-02-10 NOTE — Progress Notes (Signed)
PHARMACY - PHYSICIAN COMMUNICATION CRITICAL VALUE ALERT - BLOOD CULTURE IDENTIFICATION (BCID)  Results for orders placed or performed during the hospital encounter of 02/09/17  Blood Culture ID Panel (Reflexed) (Collected: 02/09/2017  7:30 AM)  Result Value Ref Range   Enterococcus species NOT DETECTED NOT DETECTED   Listeria monocytogenes NOT DETECTED NOT DETECTED   Staphylococcus species NOT DETECTED NOT DETECTED   Staphylococcus aureus NOT DETECTED NOT DETECTED   Streptococcus species NOT DETECTED NOT DETECTED   Streptococcus agalactiae NOT DETECTED NOT DETECTED   Streptococcus pneumoniae NOT DETECTED NOT DETECTED   Streptococcus pyogenes NOT DETECTED NOT DETECTED   Acinetobacter baumannii NOT DETECTED NOT DETECTED   Enterobacteriaceae species DETECTED (A) NOT DETECTED   Enterobacter cloacae complex NOT DETECTED NOT DETECTED   Escherichia coli NOT DETECTED NOT DETECTED   Klebsiella oxytoca NOT DETECTED NOT DETECTED   Klebsiella pneumoniae DETECTED (A) NOT DETECTED   Proteus species NOT DETECTED NOT DETECTED   Serratia marcescens NOT DETECTED NOT DETECTED   Carbapenem resistance NOT DETECTED NOT DETECTED   Haemophilus influenzae NOT DETECTED NOT DETECTED   Neisseria meningitidis NOT DETECTED NOT DETECTED   Pseudomonas aeruginosa NOT DETECTED NOT DETECTED   Candida albicans NOT DETECTED NOT DETECTED   Candida glabrata NOT DETECTED NOT DETECTED   Candida krusei NOT DETECTED NOT DETECTED   Candida parapsilosis NOT DETECTED NOT DETECTED   Candida tropicalis NOT DETECTED NOT DETECTED    Name of physician (or Provider) Contacted: Grunz  Changes to prescribed antibiotics required:  Change Vanc/Zosyn to Rocephin 2gm IV Q24h  No further dose adjustments anticipated.  Pharmacy will sign off, but continue monitor cx data/sens via electronic surveillance software.  Biagio Borg 02/10/2017  9:47 AM

## 2017-02-11 ENCOUNTER — Ambulatory Visit: Payer: Medicare Other | Admitting: Oncology

## 2017-02-11 ENCOUNTER — Other Ambulatory Visit: Payer: Medicare Other

## 2017-02-11 ENCOUNTER — Ambulatory Visit: Payer: Medicare Other

## 2017-02-11 DIAGNOSIS — R5081 Fever presenting with conditions classified elsewhere: Secondary | ICD-10-CM

## 2017-02-11 DIAGNOSIS — E119 Type 2 diabetes mellitus without complications: Secondary | ICD-10-CM

## 2017-02-11 DIAGNOSIS — D6481 Anemia due to antineoplastic chemotherapy: Secondary | ICD-10-CM

## 2017-02-11 DIAGNOSIS — B961 Klebsiella pneumoniae [K. pneumoniae] as the cause of diseases classified elsewhere: Secondary | ICD-10-CM

## 2017-02-11 DIAGNOSIS — C250 Malignant neoplasm of head of pancreas: Secondary | ICD-10-CM

## 2017-02-11 DIAGNOSIS — R7881 Bacteremia: Secondary | ICD-10-CM

## 2017-02-11 DIAGNOSIS — G620 Drug-induced polyneuropathy: Secondary | ICD-10-CM

## 2017-02-11 DIAGNOSIS — C787 Secondary malignant neoplasm of liver and intrahepatic bile duct: Secondary | ICD-10-CM

## 2017-02-11 DIAGNOSIS — A4189 Other specified sepsis: Secondary | ICD-10-CM

## 2017-02-11 LAB — GLUCOSE, CAPILLARY
GLUCOSE-CAPILLARY: 35 mg/dL — AB (ref 65–99)
GLUCOSE-CAPILLARY: 290 mg/dL — AB (ref 65–99)
Glucose-Capillary: 50 mg/dL — ABNORMAL LOW (ref 65–99)
Glucose-Capillary: 76 mg/dL (ref 65–99)
Glucose-Capillary: 193 mg/dL — ABNORMAL HIGH (ref 65–99)
Glucose-Capillary: 289 mg/dL — ABNORMAL HIGH (ref 65–99)

## 2017-02-11 LAB — BODY FLUID CULTURE

## 2017-02-11 MED ORDER — INSULIN DETEMIR 100 UNIT/ML ~~LOC~~ SOLN
30.0000 [IU] | Freq: Every day | SUBCUTANEOUS | Status: DC
  Filled 2017-02-11: qty 0.3

## 2017-02-11 NOTE — Progress Notes (Signed)
Initial Nutrition Assessment  DOCUMENTATION CODES:   Non-severe (moderate) malnutrition in context of chronic illness  INTERVENTION:   Encouraged PO intake Encouraged patient to continue supplements at home RD will continue to monitor for needs  NUTRITION DIAGNOSIS:   Malnutrition related to chronic illness, cancer and cancer related treatments as evidenced by percent weight loss, mild depletion of body fat.  GOAL:   Patient will meet greater than or equal to 90% of their needs  MONITOR:   PO intake, Labs, Weight trends, I & O's  REASON FOR ASSESSMENT:   Consult Assessment of nutrition requirement/status  ASSESSMENT:   71 y.o. male with a history of pancreatic cancer (metastastatic to liver and duodenum) on chemotherapy, hx of obstructive jaundice s/p percutaneous biliary drain 10/16/2016, HTN, HLD, DM, and GERD, who presented 4/21 with fatigue for 3 days developing into fever of 102F and shaking chills. He had no other significant symptoms, specifically no abdominal pain, nausea, vomiting, cough, dyspnea, chest pain, urinary complaints, or wounds. Biliary drain has been flushed weekly by his wife with no reported complications. On arrival, he was febrile and hypotensive with clear CXR, started on broad-spectrum antibiotics and given IV fluids which improved blood pressure. Repeat lactate normalized and he was admitted to [facility] for sepsis with uncertain source. Blood cultures have grown Klebsiella in 2 of 2, so antibiotics were narrowed to ceftriaxone pending sensitivity data.   Patient in room with no family at bedside. Pt states he feels much better today. States he feels hungry today and ate a good breakfast. Typically the pt will eat well for breakfast and lunch and will consume a small dinner. Pt states his appetite fluctuates and lately prior to his diagnosis of sepsis he was not very hungry most of the day. Denies other nutrition impact symptoms. Pt states he will drink  1-2 Special K protein shakes (each provide 190 kcal and 15g protein). He is not interested in the protein supplement options we have in the hospital. Encouraged pt to request protein supplements if desired and to continue his protein shakes at home, at least 2-3 daily.   Per patient, his weight has stayed between 142-148 lb. Since 09/19/16, pt has lost 21 lb (13% wt loss x 5 months, significant for time frame). Nutrition-Focused physical exam completed. Findings are mild fat depletion, no muscle depletion, and no edema.   Medications: CREON capsule TID, Multivitamin with minerals daily, Protonix tablet daily  Labs reviewed: CBGs: 50-76  Diet Order:  Diet Carb Modified Fluid consistency: Thin; Room service appropriate? Yes  Skin:  Reviewed, no issues  Last BM:  4/23  Height:   Ht Readings from Last 1 Encounters:  02/09/17 6' (1.829 m)    Weight:   Wt Readings from Last 1 Encounters:  02/11/17 155 lb 3.3 oz (70.4 kg)    Ideal Body Weight:  80.9 kg  BMI:  Body mass index is 21.05 kg/m.  Estimated Nutritional Needs:   Kcal:  2100-2300  Protein:  100-110g  Fluid:  2.1L/day  EDUCATION NEEDS:   Education needs addressed  Clayton Bibles, MS, RD, LDN Pager: [phone] After Hours Pager: [phone]

## 2017-02-11 NOTE — Progress Notes (Signed)
Hypoglycemic Event  CBG: 50  Treatment: 15 GM carbohydrate snack  Symptoms: None  Follow-up CBG: Time: CBG Result:  Possible Reasons for Event: medication regimen, inadequate meal intake, infection  Comments/MD notified: K. Schorr NP    Regan Rakers

## 2017-02-11 NOTE — Progress Notes (Signed)
PROGRESS NOTE  Jacob Hardin  YNW:[id number] DOB: September 11, 1946 DOA: 02/09/2017 PCP: [name], MD  Outpatient Specialists: Dr. [name], GI Dr. 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 He had no other significant symptoms, specifically no abdominal pain, nausea, vomiting, cough, dyspnea, chest pain, urinary complaints, or wounds. Biliary drain has been flushed weekly by his wife with no reported complications. On arrival, he was febrile and hypotensive with clear CXR, started on broad-spectrum antibiotics and given IV fluids which improved blood pressure. Repeat lactate normalized and he was admitted to [facility] for sepsis with uncertain source. Blood cultures have grown Klebsiella in 2 of 2, so antibiotics were narrowed to ceftriaxone pending sensitivity data.   Assessment & Plan: Principal Problem:   Severe sepsis ([place]) Active Problems:   Cancer of head of pancreas ([place])   Bacteremia due to Klebsiella pneumoniae   Malnutrition of moderate degree   Port catheter in place   DM2 (diabetes mellitus, type 2) ([place])   Pulmonary nodule, right  Sepsis due to Klebsiella bacteremia: Likely biliary source without significant LFT elevation from baseline. Lactate has normalized and vital signs are stable. UCx negative. CXR clear. Port site appears normal. Drain site with very mild erythema and no purulence.  - Transition vanc/zosyn > ceftriaxone per pharmacy 4/22, still pending sensitivity data.  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 [name].  Patient is on chemotherapy, next dose due 4/23.  - Dr. [name] saw the patient 4/23 - No evidence of biliary obstruction at this time.  - Continue weekly drain flushes - Continue creon with meals. - Hydroxyzine prn itching  Anemia: Normocytic, acute on chronic. 8.3 > 7.6mg /dl here on baseline ~10mg /dl probably related to chemotherapy. Symptomatic with increasing fatigue.   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 Will recommend iron outside of setting of bacteremia.   T2DM:Last A1c 7.0 on 09/19/15, well controlled. Patient is [place] decreased 44u to 40u 4/22 with sensitive SSI but had hypoglycemic episode 4/23, so will decrease to 30u qHS - Carb-modified diet.  Moderate malnutrition in setting of chronic illness: - Nutrition consulted, continue home supplements.  Pulmonary nodule RUL: Noted on CXR at admission, suspected granuloma.  - Repeat imaging recommended in 3 - 6 months. Defer to Dr. [name].   DVT prophylaxis: Lovenox Code Status: DNR Family Communication: None at bedside this AM Disposition Plan: Med-surg  Consultants:   Dr. [name]  Procedures:   1u PRBCs 4/22  Antimicrobials:  Vanc/zosyn 4/21 > 4/22  Ceftriaxone 4/22 >>    Subjective: Pt tolerated blood products well, had fever early this morning with chills. Denies symptoms with low blood sugar this AM. Tolerating po. No pain. No other complaints.    Objective: Vitals:   02/10/17 2050 02/11/17 0151 02/11/17 0313 02/11/17 0545  BP: 106/64   126/77  Pulse: 67   73  Resp: 18   16  Temp: 98 F (36.7 C) 97.9 F (36.6 C) 97.5 F (36.4 C) 97.5 F (36.4 C)  TempSrc: Oral Oral Oral Oral  SpO2: 98%  100%  Weight:    70.4 kg (155 lb 3.3 oz)  Height:        Intake/Output Summary (Last 24 hours) at 02/11/17 1220 Last data filed at 02/11/17 0900  Gross per 24 hour  Intake              815 ml  Output             2150  ml  Net            -1335 ml   Filed Weights   02/09/17 1600 02/10/17 0208 02/11/17 0545  Weight: 66.1 kg (145 lb 11.6 oz) 66.7 kg (147 lb) 70.4 kg (155 lb 3.3 oz)    Examination: General exam: Thin 71 y.o. male laying sitting up in chair in no distress Respiratory system: Non-labored breathing room air. Clear to auscultation bilaterally.  Cardiovascular system: Regular rate and rhythm. No murmur, rub, or gallop. No JVD, and no pedal edema. Gastrointestinal system: Abdomen soft, non-tender, non-distended, with normoactive bowel sounds. No organomegaly or masses felt. Central nervous system: Alert and oriented. No focal neurological deficits. Extremities: Warm, no deformities Skin: Port in right upper chest c/d/i without erythema. RUQ biliary drain site with <1cm erythema and no drainage/induration or tenderness.  Psychiatry: Judgement and insight appear normal. Mood & affect appropriate.   Data Reviewed: I have personally reviewed following labs and imaging studies  CBC:  Recent Labs Lab 02/09/17 0751 02/10/17 0511  WBC 3.9* 6.9  NEUTROABS 3.6  --   HGB 8.3* 7.6*  HCT 24.4* 22.6*  MCV 88.4 89.0  PLT 175 097   Basic Metabolic Panel:  Recent Labs Lab 02/09/17 0751 02/10/17 0511  NA 133* 133*  K 3.4* 4.3  CL 101 106  CO2 22 22  GLUCOSE 77 246*  BUN 18 16  CREATININE 1.05 0.79  CALCIUM 8.1* 7.8*   GFR: Estimated Creatinine Clearance: 86.8 mL/min (by C-G formula based on SCr of 0.79 mg/dL). Liver Function Tests:  Recent Labs Lab 02/09/17 0751 02/10/17 0511  AST 102* 61*  ALT 82* 64*  ALKPHOS 833* 569*  BILITOT 2.2* 1.3*  PROT 5.7* 5.1*  ALBUMIN 2.4* 2.0*   No results for input(s): LIPASE, AMYLASE in the last 168 hours. No results for input(s): AMMONIA in the last 168 hours. Coagulation Profile:  Recent Labs Lab 02/09/17 0751  INR 1.01   Cardiac Enzymes: No results for input(s): CKTOTAL, CKMB, CKMBINDEX, TROPONINI in the last 168 hours. BNP (last 3  results) No results for input(s): PROBNP in the last 8760 hours. HbA1C: No results for input(s): HGBA1C in the last 72 hours. CBG:  Recent Labs Lab 02/10/17 2004 02/11/17 3532 02/11/17 0651 02/11/17 0742 02/11/17 1213  GLUCAP 145* 35* 50* 76 193*   Lipid Profile: No results for input(s): CHOL, HDL, LDLCALC, TRIG, CHOLHDL, LDLDIRECT in the last 72 hours. Thyroid Function Tests: No results for input(s): TSH, T4TOTAL, FREET4, T3FREE, THYROIDAB in the last 72 hours. Anemia Panel:  Recent Labs  02/10/17 1200  VITAMINB12 3,261*  FOLATE 21.1  FERRITIN 252  TIBC 206*  IRON 7*  RETICCTPCT 1.2   Urine analysis:    Component Value Date/Time   COLORURINE YELLOW 02/09/2017 [place] 02/09/2017 0752   LABSPEC 1.010 02/09/2017 0752   PHURINE 5.0 02/09/2017 0752   GLUCOSEU NEGATIVE 02/09/2017 0752   HGBUR MODERATE (A) 02/09/2017 0752   BILIRUBINUR NEGATIVE 02/09/2017 [place] 02/09/2017 0752   PROTEINUR NEGATIVE 02/09/2017 0752  NITRITE NEGATIVE 02/09/2017 0752   LEUKOCYTESUR NEGATIVE 02/09/2017 0752   Recent Results (from the past 240 hour(s))  Culture, blood (Routine x 2)     Status: Abnormal (Preliminary result)   Collection Time: 02/09/17  7:30 AM  Result Value Ref Range Status   Specimen Description BLOOD BLOOD RIGHT FOREARM  Final   Special Requests   Final    BOTTLES DRAWN AEROBIC AND ANAEROBIC Blood Culture adequate volume   Culture  Setup Time   Final    GRAM NEGATIVE RODS ANAEROBIC BOTTLE ONLY CRITICAL RESULT CALLED TO, READ BACK BY AND VERIFIED WITH: N GLOGOVAC,PHARMD AT 3235 02/10/17 BY L BENFIELD    Culture (A)  Final    KLEBSIELLA PNEUMONIAE SUSCEPTIBILITIES TO FOLLOW Performed at [facility], [place] [address] [place], [place] [address]    Report Status PENDING  Incomplete  Blood Culture ID Panel (Reflexed)     Status: Abnormal   Collection Time: 02/09/17  7:30 AM  Result Value Ref Range Status   Enterococcus  species NOT DETECTED NOT DETECTED Final   Listeria monocytogenes NOT DETECTED NOT DETECTED Final   Staphylococcus species NOT DETECTED NOT DETECTED Final   Staphylococcus aureus NOT DETECTED NOT DETECTED Final   Streptococcus species NOT DETECTED NOT DETECTED Final   Streptococcus agalactiae NOT DETECTED NOT DETECTED Final   Streptococcus pneumoniae NOT DETECTED NOT DETECTED Final   Streptococcus pyogenes NOT DETECTED NOT DETECTED Final   Acinetobacter baumannii NOT DETECTED NOT DETECTED Final   Enterobacteriaceae species DETECTED (A) NOT DETECTED Final    Comment: Enterobacteriaceae represent a large family of gram-negative bacteria, not a single organism. CRITICAL RESULT CALLED TO, READ BACK BY AND VERIFIED WITH: N GLOGOVAC,PHARMD AT 0931 02/10/17 BY L BENFIELD    Enterobacter cloacae complex NOT DETECTED NOT DETECTED Final   Escherichia coli NOT DETECTED NOT DETECTED Final   Klebsiella oxytoca NOT DETECTED NOT DETECTED Final   Klebsiella pneumoniae DETECTED (A) NOT DETECTED Final    Comment: CRITICAL RESULT CALLED TO, READ BACK BY AND VERIFIED WITH: N GLOGOVAC,PHARMD AT 0931 02/10/17 BY L BENFIELD    Proteus species NOT DETECTED NOT DETECTED Final   Serratia marcescens NOT DETECTED NOT DETECTED Final   Carbapenem resistance NOT DETECTED NOT DETECTED Final   Haemophilus influenzae NOT DETECTED NOT DETECTED Final   Neisseria meningitidis NOT DETECTED NOT DETECTED Final   Pseudomonas aeruginosa NOT DETECTED NOT DETECTED Final   Candida albicans NOT DETECTED NOT DETECTED Final   Candida glabrata NOT DETECTED NOT DETECTED Final   Candida krusei NOT DETECTED NOT DETECTED Final   Candida parapsilosis NOT DETECTED NOT DETECTED Final   Candida tropicalis NOT DETECTED NOT DETECTED Final    Comment: Performed at [facility], [place]. [address], [place], [place] [address]  Culture, blood (Routine x 2)     Status: None (Preliminary result)   Collection Time: 02/09/17  7:40 AM  Result Value  Ref Range Status   Specimen Description BLOOD LEFT ANTECUBITAL  Final   Special Requests   Final    Blood Culture results may not be optimal due to an excessive volume of blood received in culture bottles   Culture  Setup Time   Final    GRAM NEGATIVE RODS IN BOTH AEROBIC AND ANAEROBIC BOTTLES CRITICAL RESULT CALLED TO, READ BACK BY AND VERIFIED WITH: N GLOGOVAC,PHARMD AT 7062 02/10/17 BY L BENFIELD    Culture   Final    GRAM NEGATIVE RODS CULTURE REINCUBATED FOR BETTER GROWTH Performed  at [facility], [place] [address], [place], [place] [address]    Report Status PENDING  Incomplete  Urine culture     Status: None   Collection Time: 02/09/17 12:30 PM  Result Value Ref Range Status   Specimen Description URINE, CLEAN CATCH  Final   Special Requests NONE  Final   Culture   Final    NO GROWTH Performed at [facility], [address], [place], [place] [address]    Report Status 02/10/2017 FINAL  Final  Body fluid culture     Status: Abnormal (Preliminary result)   Collection Time: 02/09/17  3:25 PM  Result Value Ref Range Status   Specimen Description COMMON BILE DUCT  Final   Special Requests Immunocompromised  Final   Gram Stain   Final    ABUNDANT WBC PRESENT, PREDOMINANTLY PMN ABUNDANT GRAM NEGATIVE RODS ABUNDANT GRAM POSITIVE COCCI IN PAIRS IN CHAINS ABUNDANT GRAM POSITIVE RODS    Culture (A)  Final    MULTIPLE ORGANISMS PRESENT, NONE PREDOMINANT AEROBICALLY HOLDING FOR POSSIBLE ANAEROBE Performed at [facility], [place] [address], [place],  [address]    Report Status PENDING  Incomplete      Radiology Studies: No results found.  Scheduled Meds: . enoxaparin (LOVENOX) injection  40 mg Subcutaneous Q24H  . insulin aspart  0-5 Units Subcutaneous QHS  . insulin aspart  0-9 Units Subcutaneous TID WC  . insulin detemir  30 Units Subcutaneous QHS  . lipase/protease/amylase  36,000 Units Oral TID AC  . loratadine  10 mg Oral Daily  . multivitamin  with minerals  1 tablet Oral Daily  . pantoprazole  40 mg Oral Daily   Continuous Infusions: . sodium chloride 125 mL/hr at 02/10/17 0516  . sodium chloride    . cefTRIAXone (ROCEPHIN)  IV 2 g (02/11/17 1204)     LOS: 2 days   Time spent: 25 minutes.  Vance Gather, MD [facility] Pager [phone]  If 7PM-7AM, please contact night-coverage [email or web address] Password [facility] 02/11/2017, 12:20 PM

## 2017-02-11 NOTE — Progress Notes (Signed)
IP PROGRESS NOTE  Subjective:   Jacob Hardin was last treated with FOLFIRI chemotherapy on 01/28/2017. He reports tolerating the chemotherapy well. He felt well until he developed the onset of fever and chills 02/08/2017. He presented to the emergency room for further evaluation. Blood cultures have returned positive for Klebsiella. He continues to have fever and chills. He otherwise feels well.  Objective: Vital signs in last 24 hours: Blood pressure 123/80, pulse 65, temperature 97.3 F (36.3 C), temperature source Oral, resp. rate 16, height 6' (1.829 m), weight 155 lb 3.3 oz (70.4 kg), SpO2 100 %.  Intake/Output from previous day: 04/22 0701 - 04/23 0700 In: 3274.6 [P.O.:1200; I.V.:1739.6; Blood:335] Out: 2100 [Urine:2100]  Physical Exam:  HEENT: No thrush or ulcers Lungs: Clear bilaterally Cardiac: Regular rate and rhythm Abdomen: No hepatosplenomegaly, nontender, right upper quadrant biliary drain site with a gauze dressing Extremities: No leg edema   Portacath/PICC-without erythema  Lab Results:  Recent Labs  02/09/17 0751 02/10/17 0511  WBC 3.9* 6.9  HGB 8.3* 7.6*  HCT 24.4* 22.6*  PLT 175 168    BMET  Recent Labs  02/09/17 0751 02/10/17 0511  NA 133* 133*  K 3.4* 4.3  CL 101 106  CO2 22 22  GLUCOSE 77 246*  BUN 18 16  CREATININE 1.05 0.79  CALCIUM 8.1* 7.8*     Medications: I have reviewed the patient's current medications.  Assessment/Plan: 1.Pancreas cancer, stage IV, pancreas head mass, status post an EUS biopsy 11/18/2015 confirming adenocarcinoma  Upper endoscopy 11/18/2015 confirmed duodenal invasion/obstruction with a biopsy confirming adenocarcinoma  Placement of a duodenal stent 11/22/2015  MRI abdomen 11/15/2015 revealed a pancreas head mass and liver metastases  Cycle 1 FOLFIRINOX 12/06/2015  Cycle 2 FOLFIRINOX 12/21/2015  Cycle 3 FOLFIRINOX 01/04/2016  CT abdomen/pelvis 01/19/2016 showed improvement in the pancreatic head  mass and liver metastases.  Cycle 4 FOLFIRINOX 02/01/2016  Cycle 5 FOLFIRINOX 02/15/2016  Cycle 6 FOLFIRINOX 02/29/2016  Cycle 7 FOLFIRINOX 03/14/2016  Cycle 8 FOLFIRINOX 03/28/2016  CT abdomen/pelvis 04/09/2016-improvement in pancreas head mass and liver metastases, no new metastatic disease  Chemotherapy switch to FOLFIRI every 3 weeks beginning 04/11/2016  CT 07/30/2016-improvement in liver metastases, stable pancreas mass   FOLFIRI continued on a 3 week schedule  FOLFIRI 09/19/2016.  Treatment subsequently placed on hold due to biliary obstruction  FOLFIRI resumed on a 3 week schedule 01/07/2017  2. Diabetes  3. History of Anorexia/weight loss secondary to #1  4. Obstructive jaundice secondary to #1-status post placement of a percutaneous internal/external biliary drain 11/25/2015; biliary drainage catheter capped 12/02/2015; biliary drain internalized 12/13/2015  5. Port-A-Cath placement 11/30/2015 interventional radiology  6. Admission 01/17/2016 with a high fever and rigors-no apparent source for infection upon review of his history and examination, blood cultures positive for gram-positive cocci--viridans streptococcus--completed 2 weeks of IV ceftriaxone; no endocarditis on TTE.  7. Oxaliplatin neuropathy  8. Elevated transaminases, alkaline phosphatase, and bilirubin 06/13/2016.06/19/2016 bilirubin improved to normal range; liver enzymes remain elevated.  9. Generalized pruritus,no rash 10/10/2016.Likely secondary to hyperbilirubinemia. Resolved 11/12/2016. Recurrent 12/03/2016.  10. Hospitalization 10/10/2016 through 10/20/2016 with biliary obstruction/sepsis. Status post placement of a biliary drain in interventional radiology 10/16/2016. Status post ERCP 10/26/2016 with biliary stent exchange. There was no bile drainage. Status post cholangiogram and exchange of biliary drain 11/07/2016. Biliary drain capped 11/12/2016.  11.  Admission 02/09/2017 with Klebsiella bacteremia  12. Anemia secondary to chemotherapy and chronic disease  Jacob Hardin was again admitted with biliary sepsis on 02/09/2017. The  sepsis is likely related to the biliary obstruction and the indwelling stent/drain. The elevated alkaline phosphatase and bilirubin have improved. There is no clinical evidence for progression of pancreas cancer.  The severe anemia is secondary to chemotherapy and chronic disease.  Recommendations: 1. Continue antibiotics for management of the Klebsiella bacteremia 2. The cycle of chemotherapy planned for today will be rescheduled 3. Consider GI/interventional radiology consultation for evaluation of the biliary drain 4. Agree with a red cell transfusion for the anemia     LOS: 2 days   Betsy Coder, MD   02/11/2017, 1:40 PM

## 2017-02-11 NOTE — Progress Notes (Signed)
Hypoglycemic Event  CBG: 35  Treatment: 15 GM carbohydrate snack  Symptoms: None  Follow-up CBG: Time: 9144 CBG Result: 50  Possible Reasons for Event: medication regimen, inadequate meal intake, infection  Comments/MD notified:K. Schorr NP at [phone]    Regan Rakers

## 2017-02-12 ENCOUNTER — Encounter (HOSPITAL_COMMUNITY): Payer: Self-pay | Admitting: Interventional Radiology

## 2017-02-12 ENCOUNTER — Inpatient Hospital Stay (HOSPITAL_COMMUNITY): Payer: Medicare Other

## 2017-02-12 HISTORY — PX: IR EXCHANGE BILIARY DRAIN: IMG6046

## 2017-02-12 LAB — CULTURE, BLOOD (ROUTINE X 2): Special Requests: ADEQUATE

## 2017-02-12 LAB — COMPREHENSIVE METABOLIC PANEL
ALBUMIN: 2.1 g/dL — AB (ref 3.5–5.0)
ALT: 60 U/L (ref 17–63)
ANION GAP: 6 (ref 5–15)
AST: 58 U/L — AB (ref 15–41)
Alkaline Phosphatase: 724 U/L — ABNORMAL HIGH (ref 38–126)
BUN: 10 mg/dL (ref 6–20)
CO2: 22 mmol/L (ref 22–32)
Calcium: 7.9 mg/dL — ABNORMAL LOW (ref 8.9–10.3)
Chloride: 106 mmol/L (ref 101–111)
Creatinine, Ser: 0.73 mg/dL (ref 0.61–1.24)
GFR calc Af Amer: >60 mL/min (ref >60)
GFR calc non Af Amer: >60 mL/min (ref >60)
GLUCOSE: 236 mg/dL — AB (ref 65–99)
POTASSIUM: 3.5 mmol/L (ref 3.5–5.1)
SODIUM: 134 mmol/L — AB (ref 135–145)
Total Bilirubin: 1.1 mg/dL (ref 0.3–1.2)
Total Protein: 5.5 g/dL — ABNORMAL LOW (ref 6.5–8.1)

## 2017-02-12 LAB — TYPE AND SCREEN
ABO/RH(D): A POS
Antibody Screen: NEGATIVE
UNIT DIVISION: 0

## 2017-02-12 LAB — BPAM RBC
Blood Product Expiration Date: 201805032359
ISSUE DATE / TIME: 201804221624
UNIT TYPE AND RH: 6200

## 2017-02-12 LAB — GLUCOSE, CAPILLARY
GLUCOSE-CAPILLARY: 318 mg/dL — AB (ref 65–99)
Glucose-Capillary: 241 mg/dL — ABNORMAL HIGH (ref 65–99)
Glucose-Capillary: 247 mg/dL — ABNORMAL HIGH (ref 65–99)
Glucose-Capillary: 146 mg/dL — ABNORMAL HIGH (ref 65–99)
Glucose-Capillary: 171 mg/dL — ABNORMAL HIGH (ref 65–99)

## 2017-02-12 LAB — CBC
HEMATOCRIT: 27.5 % — AB (ref 39.0–52.0)
HEMOGLOBIN: 9.1 g/dL — AB (ref 13.0–17.0)
MCH: 28.3 pg (ref 26.0–34.0)
MCHC: 33.1 g/dL (ref 30.0–36.0)
MCV: 85.7 fL (ref 78.0–100.0)
Platelets: 184 10*3/uL (ref 150–400)
RBC: 3.21 MIL/uL — ABNORMAL LOW (ref 4.22–5.81)
RDW: 15.5 % (ref 11.5–15.5)
WBC: 4.8 10*3/uL (ref 4.0–10.5)

## 2017-02-12 MED ORDER — INSULIN ASPART 100 UNIT/ML ~~LOC~~ SOLN
0.0000 [IU] | Freq: Three times a day (TID) | SUBCUTANEOUS | Status: DC
Start: 2017-02-12 — End: 2017-02-16
  Administered 2017-02-12: 2 [IU] via SUBCUTANEOUS
  Administered 2017-02-13: 8 [IU] via SUBCUTANEOUS
  Administered 2017-02-13: 2 [IU] via SUBCUTANEOUS
  Administered 2017-02-13: 5 [IU] via SUBCUTANEOUS
  Administered 2017-02-14: 2 [IU] via SUBCUTANEOUS
  Administered 2017-02-14: 5 [IU] via SUBCUTANEOUS
  Administered 2017-02-14: 3 [IU] via SUBCUTANEOUS
  Administered 2017-02-15: 11 [IU] via SUBCUTANEOUS
  Administered 2017-02-15 – 2017-02-16 (×2): 8 [IU] via SUBCUTANEOUS
  Administered 2017-02-16: 5 [IU] via SUBCUTANEOUS

## 2017-02-12 MED ORDER — FENTANYL CITRATE (PF) 100 MCG/2ML IJ SOLN
INTRAMUSCULAR | Status: AC
  Filled 2017-02-12: qty 6

## 2017-02-12 MED ORDER — INSULIN DETEMIR 100 UNIT/ML ~~LOC~~ SOLN
30.0000 [IU] | Freq: Every day | SUBCUTANEOUS | Status: DC
  Administered 2017-02-12 – 2017-02-16 (×5): 30 [IU] via SUBCUTANEOUS
  Filled 2017-02-12 (×5): qty 0.3

## 2017-02-12 MED ORDER — TRAMADOL HCL 50 MG PO TABS
50.0000 mg | ORAL_TABLET | Freq: Four times a day (QID) | ORAL | Status: AC | PRN
  Administered 2017-02-12 – 2017-02-13 (×2): 50 mg via ORAL
  Filled 2017-02-12 (×2): qty 1

## 2017-02-12 MED ORDER — FENTANYL CITRATE (PF) 100 MCG/2ML IJ SOLN
INTRAMUSCULAR | Status: AC | PRN
  Administered 2017-02-12: 50 ug via INTRAVENOUS

## 2017-02-12 MED ORDER — LIDOCAINE-EPINEPHRINE (PF) 2 %-1:200000 IJ SOLN
INTRAMUSCULAR | Status: AC | PRN
  Administered 2017-02-12: 10 mL

## 2017-02-12 MED ORDER — INSULIN ASPART 100 UNIT/ML ~~LOC~~ SOLN: 0.0000 [IU] | Freq: Every day | SUBCUTANEOUS | Status: DC

## 2017-02-12 MED ORDER — ZOLPIDEM TARTRATE 5 MG PO TABS
5.0000 mg | ORAL_TABLET | Freq: Every evening | ORAL | Status: DC | PRN
  Administered 2017-02-12 – 2017-02-15 (×2): 5 mg via ORAL
  Filled 2017-02-12 (×2): qty 1

## 2017-02-12 MED ORDER — ONDANSETRON HCL 4 MG/2ML IJ SOLN
INTRAMUSCULAR | Status: AC
  Administered 2017-02-12: 4 mg via INTRAVENOUS
  Filled 2017-02-12: qty 2

## 2017-02-12 MED ORDER — IOPAMIDOL (ISOVUE-300) INJECTION 61%
5.0000 mL | Freq: Once | INTRAVENOUS | Status: AC | PRN
  Administered 2017-02-12: 5 mL

## 2017-02-12 MED ORDER — MIDAZOLAM HCL 2 MG/2ML IJ SOLN
INTRAMUSCULAR | Status: AC | PRN
  Administered 2017-02-12 (×2): 1 mg via INTRAVENOUS

## 2017-02-12 MED ORDER — IOPAMIDOL (ISOVUE-300) INJECTION 61%
INTRAVENOUS | Status: AC
  Administered 2017-02-12: 5 mL
  Filled 2017-02-12: qty 50

## 2017-02-12 MED ORDER — MIDAZOLAM HCL 2 MG/2ML IJ SOLN
INTRAMUSCULAR | Status: AC
  Filled 2017-02-12: qty 6

## 2017-02-12 MED ORDER — LIDOCAINE-EPINEPHRINE (PF) 2 %-1:200000 IJ SOLN
INTRAMUSCULAR | Status: AC
  Filled 2017-02-12: qty 20

## 2017-02-12 NOTE — Progress Notes (Signed)
Referring Physician(s): Sherrill,B  Supervising Physician: Sandi Mariscal  Patient Status:  [name] - In-pt  Chief Complaint: Metastatic pancreatic cancer, biliary obstruction, bacteremia   Subjective: Patient familiar to IR service from prior internal/external biliary drain placement on 11/25/15 for distal common bile duct obstruction secondary to pancreatic cancer as well as Port-A-Cath placement on 11/30/15; patient has also had endoscopic CBD stent placed;  biliary drain was capped on 12/02/15 and he subsequently underwent internalization of the biliary drain using a wall flex stent on 12/13/15. On 10/16/16 he underwent new right internal/external biliary drain placement seconadry to obstruction in the common hepatic duct above the extent of the previous stent. On 11/07/16 he underwent exchange and upsizing of the internal/external biliary drain and the drain was again capped on 11/12/16. He now presents to the hospital with fevers /shaking chills. Blood cultures revealed Proteus, Klebsiella. He is currently on Rocephin. Biliary drain site looks stable, drain capped, dressing dry, site NT. Current labs include WBC 4.8, hemoglobin 9.1, platelets 184k, creatinine 0.73, total bilirubin 1.1. Request received for biliary drain exchange.    Allergies: Codeine  Medications: Prior to Admission medications   Medication Sig Start Date End Date Taking? Authorizing Provider  acetaminophen (TYLENOL) 500 MG tablet Take 1,000 mg by mouth every 6 (six) hours as needed.   Yes Historical Provider, MD  baclofen (LIORESAL) 10 MG tablet Take 5 mg by mouth 3 (three) times daily as needed (for hiccups).   Yes Historical Provider, MD  esomeprazole (NEXIUM) 40 MG capsule Take 40 mg by mouth daily.   Yes Historical Provider, MD  hydrOXYzine (ATARAX/VISTARIL) 25 MG tablet Take 1 tablet (25 mg total) by mouth every 8 (eight) hours as needed for itching. 12/06/16  Yes Ladell Pier, MD  insulin aspart (NOVOLOG FLEXPEN) 100  UNIT/ML FlexPen Inject 4 Units into the skin daily before supper.   Yes Historical Provider, MD  Insulin Detemir (LEVEMIR FLEXTOUCH) 100 UNIT/ML Pen Inject 40 Units into the skin daily.   Yes Historical Provider, MD  lidocaine-prilocaine (EMLA) cream Apply 1 application topically as needed (prior to accessing port).   Yes Historical Provider, MD  lipase/protease/amylase (CREON) 36000 UNITS CPEP capsule Take 1 capsule (36,000 Units total) by mouth 3 (three) times daily before meals. 02/07/17  Yes Ladell Pier, MD  loperamide (IMODIUM) 2 MG capsule Take 2 mg by mouth as needed for diarrhea or loose stools.   Yes Historical Provider, MD  loratadine (CLARITIN) 10 MG tablet Take 10 mg by mouth daily.    Yes Historical Provider, MD  Multiple Vitamin (MULTIVITAMIN WITH MINERALS) TABS tablet Take 1 tablet by mouth daily.   Yes Historical Provider, MD  ondansetron (ZOFRAN) 8 MG tablet Take 8 mg by mouth every 6 (six) hours as needed for nausea or vomiting.    Yes Historical Provider, MD  polyethylene glycol (MIRALAX / GLYCOLAX) packet Take 17 g by mouth daily as needed for mild constipation.    Yes Historical Provider, MD  prochlorperazine (COMPAZINE) 10 MG tablet Take 1 tablet (10 mg total) by mouth every 6 (six) hours as needed for nausea or vomiting. 12/02/15  Yes Ladell Pier, MD  BD PEN NEEDLE NANO U/F 32G X 4 MM MISC USE TO INJECT INSULIN TWICE A DAY 11/06/16   [name], MD  traMADol (ULTRAM) 50 MG tablet Take 1 tablet (50 mg total) by mouth every 6 (six) hours as needed. Patient not taking: Reported on 01/21/2017 12/31/16   Ladell Pier,  MD     Vital Signs: BP (!) 144/86 (BP Location: Right Arm)   Pulse 75   Temp 98.7 F (37.1 C) (Oral)   Resp 16   Ht 6' (1.829 m)   Wt 154 lb (69.9 kg)   SpO2 99%   BMI 20.89 kg/m   Physical Exam patient awake, alert. Chest with slightly diminished breath sounds right base, left clear. Heart with regular rate and rhythm. Abdomen soft, positive  bowel sounds, clean, intact and capped  right biliary drainage catheter, lower extremities with trace to 1+ edema bilaterally.  Imaging: Dg Chest 2 View  Result Date: 02/09/2017 CLINICAL DATA:  Pt c/o new onset fever last night, lethargy this week, hx pancreatic cancer, has been having treatment x 1.5years, last chemo x 3 wks ago. EXAM: CHEST  2 VIEW COMPARISON:  Chest x-ray dated 10/12/2016. FINDINGS: Heart size and mediastinal contours are normal. Atherosclerotic changes noted at the aortic arch. Right chest wall Port-A-Cath appears stable in position. Dense nodule within the right upper lobe, presumed granuloma. No pleural effusion or pneumothorax seen. No acute or suspicious osseous finding. Mild degenerative spurring noted within the thoracic spine. IMPRESSION: 1. No active cardiopulmonary disease. No evidence of pneumonia or pulmonary edema. 2. **An incidental finding of potential clinical significance has been found. Dense pulmonary nodule within the right upper lobe, presumed benign granuloma, likely present on previous exams but better seen on today's study. As the density suggests benign granuloma, would merely recommend follow-up chest x-ray in 3-6 months to ensure stability.** 3. Aortic atherosclerosis. Electronically Signed   By: Franki Cabot M.D.   On: 02/09/2017 08:49    Labs:  CBC:  Recent Labs  01/28/17 0754 02/09/17 0751 02/10/17 0511 02/12/17 0606  WBC 3.6* 3.9* 6.9 4.8  HGB 9.9* 8.3* 7.6* 9.1*  HCT 29.3* 24.4* 22.6* 27.5*  PLT 196 175 168 184    COAGS:  Recent Labs  10/12/16 2021 11/07/16 0945 02/09/17 0751  INR 0.98 1.01 1.01  APTT 30  --   --     BMP:  Recent Labs  11/07/16 0945  01/28/17 0754 02/09/17 0751 02/10/17 0511 02/12/17 0606  NA 137  < > 138 133* 133* 134*  K 3.8  < > 3.4* 3.4* 4.3 3.5  CL 104  --   --  101 106 106  CO2 25  < > 26 22 22 22   GLUCOSE 136*  < > 238* 77 246* 236*  BUN 16  < > 9.7 18 16 10   CALCIUM 8.9  < > 9.2 8.1* 7.8*  7.9*  CREATININE 0.74  < > 0.7 1.05 0.79 0.73  GFRNONAA >60  --   --  >60 >60 >60  GFRAA >60  --   --  >60 >60 >60  < > = values in this interval not displayed.  LIVER FUNCTION TESTS:  Recent Labs  01/28/17 0754 02/09/17 0751 02/10/17 0511 02/12/17 0606  BILITOT 1.54* 2.2* 1.3* 1.1  AST 48* 102* 61* 58*  ALT 66* 82* 64* 60  ALKPHOS 1,002* 833* 569* 724*  PROT 6.6 5.7* 5.1* 5.5*  ALBUMIN 2.7* 2.4* 2.0* 2.1*    Assessment and Plan: Patient with history of metastatic pancreatic cancer, biliary obstruction, status post prior internal/external biliary drain and wall flex stenting; presents now with bacteremia; currently afebrile with normal WBC, normal total bilirubin, normal creatinine ,biliary drain currently capped; on IV Rocephin; last drain exchange performed in January of this year; case discussed with Dr. Pascal Lux. We'll plan  for biliary drain exchange today; above plan was discussed with patient and wife.   Electronically Signed: D. Rowe Doak 02/12/2017, 11:47 AM   I spent a total of 20 minutes at the the patient's bedside AND on the patient's hospital floor or unit, greater than 50% of which was counseling/coordinating care for biliary drain    Patient ID: Jacob Hardin, male   DOB: 09/02/1946, 71 y.o.   MRN: [id number]

## 2017-02-12 NOTE — Progress Notes (Signed)
PHARMACY - PHYSICIAN COMMUNICATION CRITICAL VALUE ALERT - BLOOD CULTURE IDENTIFICATION (BCID)  Results for orders placed or performed during the hospital encounter of 02/09/17  Blood Culture ID Panel (Reflexed) (Collected: 02/09/2017  7:30 AM)  Result Value Ref Range   Enterococcus species NOT DETECTED NOT DETECTED   Listeria monocytogenes NOT DETECTED NOT DETECTED   Staphylococcus species NOT DETECTED NOT DETECTED   Staphylococcus aureus NOT DETECTED NOT DETECTED   Streptococcus species NOT DETECTED NOT DETECTED   Streptococcus agalactiae NOT DETECTED NOT DETECTED   Streptococcus pneumoniae NOT DETECTED NOT DETECTED   Streptococcus pyogenes NOT DETECTED NOT DETECTED   Acinetobacter baumannii NOT DETECTED NOT DETECTED   Enterobacteriaceae species DETECTED (A) NOT DETECTED   Enterobacter cloacae complex NOT DETECTED NOT DETECTED   Escherichia coli NOT DETECTED NOT DETECTED   Klebsiella oxytoca NOT DETECTED NOT DETECTED   Klebsiella pneumoniae DETECTED (A) NOT DETECTED   Proteus species NOT DETECTED NOT DETECTED   Serratia marcescens NOT DETECTED NOT DETECTED   Carbapenem resistance NOT DETECTED NOT DETECTED   Haemophilus influenzae NOT DETECTED NOT DETECTED   Neisseria meningitidis NOT DETECTED NOT DETECTED   Pseudomonas aeruginosa NOT DETECTED NOT DETECTED   Candida albicans NOT DETECTED NOT DETECTED   Candida glabrata NOT DETECTED NOT DETECTED   Candida krusei NOT DETECTED NOT DETECTED   Candida parapsilosis NOT DETECTED NOT DETECTED   Candida tropicalis NOT DETECTED NOT DETECTED    Name of physician (or Provider) ContactedBonner Puna  Changes to prescribed antibiotics required: none  Dolly Rias RPh 02/12/2017, 9:48 AM Pager [phone]

## 2017-02-12 NOTE — Procedures (Signed)
Successful fluroscopic exchange of a right sided approach transhepatic 12 Fr biliary drainage catheter with end coiled and locked within the duodenum.  Biliary drain capped.  EBL: None  No immediate post procedural complications.  Jay Mohammad Granade, MD Pager #: [phone]  

## 2017-02-12 NOTE — Progress Notes (Signed)
PROGRESS NOTE  Jacob Hardin  PPI:[id number] DOB: 1946-05-25 DOA: 02/09/2017 PCP: [name], MD  Outpatient Specialists: Dr. [name], GI Dr. 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 He had no other significant symptoms, specifically no abdominal pain, nausea, vomiting, cough, dyspnea, chest pain, urinary complaints, or wounds. Biliary drain has been flushed weekly by his wife with no reported complications. On arrival, he was febrile and hypotensive with clear CXR, started on broad-spectrum antibiotics and given IV fluids which improved blood pressure. Repeat lactate normalized and he was admitted to [facility] for sepsis with uncertain source. Blood cultures have grown Klebsiella in 2 of 2, so antibiotics were narrowed to ceftriaxone pending sensitivity data. IR was consulted for biliary drain evaluation and is planning exchange during hospitalization. This required anesthesia in the past.  Assessment & Plan: Principal Problem:   Severe sepsis ([place]) Active Problems:   Cancer of head of pancreas ([place])   Bacteremia due to Klebsiella pneumoniae   Malnutrition of moderate degree   Port catheter in place   DM2 (diabetes mellitus, type 2) ([place])   Pulmonary nodule, right  Sepsis due to Klebsiella and Proteus bacteremia: Likely biliary source without significant LFT elevation from baseline. Lactate has normalized and vital signs are stable. UCx negative. CXR clear. Port site appears normal. Drain site uninfected and culture grew C. perfringens, though this is not felt to be contributing to pathogenicity at this time. - Started vanc/zosyn 4/21 > ceftriaxone 4/22. Discussed with ID, Dr.  Johnnye Sima, based on susceptibilities, he recommends 7 days of IV therapy (continue ceftriaxone, last daily dose will be 4/27) followed by 7 days of oral therapy (e.g. keflex).  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 [name]. Patient is on chemotherapy, next dose due 4/23. Dr. [name] saw the patient 4/23 - No evidence of biliary obstruction at this time, IR to exchange drain while inpatient.  - Continue creon with meals. - Hydroxyzine prn itching  Anemia: Normocytic, acute on chronic. Hgb 7.6mg /dl. Baseline ~10mg /dl probably related to chemotherapy. Symptomatic with increasing fatigue, so gave 1u PRBCs with increase to 9.1mg /dl. No further bleeding.  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 Will recommend iron outside of setting of bacteremia.   T2DM:Last A1c 7.0 on 09/19/15, well controlled. Patient is [place] decreased 44u to 40u 4/22 with sensitive SSI but had hypoglycemic episode 4/23, so decreased to 30u qHS. Will increase SSI and monitor. - Carb-modified diet.  Moderate malnutrition in setting of chronic illness: - Nutrition consulted, continue home supplements.  Pulmonary nodule RUL: Noted on CXR at admission, suspected granuloma.  - Repeat imaging recommended in 3 - 6 months. Defer to Dr. [name].   DVT prophylaxis: Lovenox Code Status: DNR Family Communication: Wife at bedside this AM Disposition Plan: Possible DC to home 4/25.  Consultants:   Dr. [name], oncology  Dr. Pascal Lux, IR  Procedures:   1u PRBCs 4/22  Antimicrobials:  Vanc/zosyn 4/21 > 4/22  Ceftriaxone 4/22 >>    Subjective: Pt ambulating in halls, no fevers'chills in past 24 hours. Abd feels "full" without tenderness. Normal bowel movements, no nausea  or vomiting. No dyspnea.   Objective: Vitals:   02/11/17 0545 02/11/17 1252 02/11/17 2127  02/12/17 0520  BP: 126/77 123/80 139/84 (!) 144/86  Pulse: 73 65 86 75  Resp: 16 16 16 16   Temp: 97.5 F (36.4 C) 97.3 F (36.3 C) 98.8 F (37.1 C) 98.7 F (37.1 C)  TempSrc: Oral Oral Oral Oral  SpO2: 100% 100% 97% 99%  Weight: 70.4 kg (155 lb 3.3 oz)   69.9 kg (154 lb)  Height:        Intake/Output Summary (Last 24 hours) at 02/12/17 0956 Last data filed at 02/12/17 4818  Gross per 24 hour  Intake             3910 ml  Output                0 ml  Net             3910 ml   Filed Weights   02/10/17 0208 02/11/17 0545 02/12/17 0520  Weight: 66.7 kg (147 lb) 70.4 kg (155 lb 3.3 oz) 69.9 kg (154 lb)    Examination: General exam: Thin 71 y.o. male walking in halls in no distress Respiratory system: Non-labored breathing room air. Clear to auscultation bilaterally.  Cardiovascular system: Regular rate and rhythm. No murmur, rub, or gallop. No JVD, and no pedal edema. Gastrointestinal system: Abdomen soft, non-tender, mildly distended, with normoactive bowel sounds. No organomegaly or masses felt. Central nervous system: Alert and oriented. No focal neurological deficits. Extremities: Warm, no deformities Skin: Port in right upper chest c/d/i without erythema. RUQ biliary drain site without erythema, drainage/induration or tenderness.  Psychiatry: Judgement and insight appear normal. Mood & affect appropriate.   Data Reviewed: I have personally reviewed following labs and imaging studies  CBC:  Recent Labs Lab 02/09/17 0751 02/10/17 0511 02/12/17 0606  WBC 3.9* 6.9 4.8  NEUTROABS 3.6  --   --   HGB 8.3* 7.6* 9.1*  HCT 24.4* 22.6* 27.5*  MCV 88.4 89.0 85.7  PLT 175 168 563   Basic Metabolic Panel:  Recent Labs Lab 02/09/17 0751 02/10/17 0511 02/12/17 0606  NA 133* 133* 134*  K 3.4* 4.3 3.5  CL 101 106 106  CO2 22 22 22   GLUCOSE 77 246* 236*  BUN 18 16 10   CREATININE 1.05 0.79 0.73  CALCIUM 8.1* 7.8* 7.9*   GFR: Estimated Creatinine Clearance: 86.2 mL/min  (by C-G formula based on SCr of 0.73 mg/dL). Liver Function Tests:  Recent Labs Lab 02/09/17 0751 02/10/17 0511 02/12/17 0606  AST 102* 61* 58*  ALT 82* 64* 60  ALKPHOS 833* 569* 724*  BILITOT 2.2* 1.3* 1.1  PROT 5.7* 5.1* 5.5*  ALBUMIN 2.4* 2.0* 2.1*   Coagulation Profile:  Recent Labs Lab 02/09/17 0751  INR 1.01   CBG:  Recent Labs Lab 02/11/17 0651 02/11/17 0742 02/11/17 1213 02/11/17 1639 02/11/17 2125  GLUCAP 50* 76 193* 289* 290*   Anemia Panel:  Recent Labs  02/10/17 1200  VITAMINB12 3,261*  FOLATE 21.1  FERRITIN 252  TIBC 206*  IRON 7*  RETICCTPCT 1.2   Urine analysis:    Component Value Date/Time   COLORURINE YELLOW 02/09/2017 [place] 02/09/2017 0752   LABSPEC 1.010 02/09/2017 0752   PHURINE 5.0 02/09/2017 0752   GLUCOSEU NEGATIVE 02/09/2017 0752   HGBUR MODERATE (A) 02/09/2017 0752   BILIRUBINUR NEGATIVE 02/09/2017 0752   KETONESUR NEGATIVE 02/09/2017 0752   PROTEINUR NEGATIVE 02/09/2017 0752   NITRITE  NEGATIVE 02/09/2017 0752   LEUKOCYTESUR NEGATIVE 02/09/2017 0752   Recent Results (from the past 240 hour(s))  Culture, blood (Routine x 2)     Status: Abnormal   Collection Time: 02/09/17  7:30 AM  Result Value Ref Range Status   Specimen Description BLOOD BLOOD RIGHT FOREARM  Final   Special Requests   Final    BOTTLES DRAWN AEROBIC AND ANAEROBIC Blood Culture adequate volume   Culture  Setup Time   Final    GRAM NEGATIVE RODS ANAEROBIC BOTTLE ONLY CRITICAL RESULT CALLED TO, READ BACK BY AND VERIFIED WITH: N GLOGOVAC,PHARMD AT 4174 02/10/17 BY L BENFIELD Performed at [facility], [place] [address], [place], [place] [address]    Culture KLEBSIELLA PNEUMONIAE (A)  Final   Report Status 02/12/2017 FINAL  Final   Organism ID, Bacteria KLEBSIELLA PNEUMONIAE  Final      Susceptibility   Klebsiella pneumoniae - MIC*    AMPICILLIN >=32 RESISTANT Resistant     CEFAZOLIN <=4 SENSITIVE Sensitive     CEFEPIME <=1  SENSITIVE Sensitive     CEFTAZIDIME <=1 SENSITIVE Sensitive     CEFTRIAXONE <=1 SENSITIVE Sensitive     CIPROFLOXACIN <=0.25 SENSITIVE Sensitive     GENTAMICIN <=1 SENSITIVE Sensitive     IMIPENEM <=0.25 SENSITIVE Sensitive     TRIMETH/SULFA <=20 SENSITIVE Sensitive     AMPICILLIN/SULBACTAM 4 SENSITIVE Sensitive     PIP/TAZO <=4 SENSITIVE Sensitive     Extended ESBL NEGATIVE Sensitive     * KLEBSIELLA PNEUMONIAE  Blood Culture ID Panel (Reflexed)     Status: Abnormal   Collection Time: 02/09/17  7:30 AM  Result Value Ref Range Status   Enterococcus species NOT DETECTED NOT DETECTED Final   Listeria monocytogenes NOT DETECTED NOT DETECTED Final   Staphylococcus species NOT DETECTED NOT DETECTED Final   Staphylococcus aureus NOT DETECTED NOT DETECTED Final   Streptococcus species NOT DETECTED NOT DETECTED Final   Streptococcus agalactiae NOT DETECTED NOT DETECTED Final   Streptococcus pneumoniae NOT DETECTED NOT DETECTED Final   Streptococcus pyogenes NOT DETECTED NOT DETECTED Final   Acinetobacter baumannii NOT DETECTED NOT DETECTED Final   Enterobacteriaceae species DETECTED (A) NOT DETECTED Final    Comment: Enterobacteriaceae represent a large family of gram-negative bacteria, not a single organism. CRITICAL RESULT CALLED TO, READ BACK BY AND VERIFIED WITH: N GLOGOVAC,PHARMD AT 0931 02/10/17 BY L BENFIELD    Enterobacter cloacae complex NOT DETECTED NOT DETECTED Final   Escherichia coli NOT DETECTED NOT DETECTED Final   Klebsiella oxytoca NOT DETECTED NOT DETECTED Final   Klebsiella pneumoniae DETECTED (A) NOT DETECTED Final    Comment: CRITICAL RESULT CALLED TO, READ BACK BY AND VERIFIED WITH: N GLOGOVAC,PHARMD AT 0931 02/10/17 BY L BENFIELD    Proteus species NOT DETECTED NOT DETECTED Final   Serratia marcescens NOT DETECTED NOT DETECTED Final   Carbapenem resistance NOT DETECTED NOT DETECTED Final   Haemophilus influenzae NOT DETECTED NOT DETECTED Final   Neisseria  meningitidis NOT DETECTED NOT DETECTED Final   Pseudomonas aeruginosa NOT DETECTED NOT DETECTED Final   Candida albicans NOT DETECTED NOT DETECTED Final   Candida glabrata NOT DETECTED NOT DETECTED Final   Candida krusei NOT DETECTED NOT DETECTED Final   Candida parapsilosis NOT DETECTED NOT DETECTED Final   Candida tropicalis NOT DETECTED NOT DETECTED Final    Comment: Performed at [facility], [place] [address], [place],  [address]  Culture, blood (Routine x 2)  Status: Abnormal (Preliminary result)   Collection Time: 02/09/17  7:40 AM  Result Value Ref Range Status   Specimen Description BLOOD LEFT ANTECUBITAL  Final   Special Requests   Final    Blood Culture results may not be optimal due to an excessive volume of blood received in culture bottles   Culture  Setup Time   Final    GRAM NEGATIVE RODS IN BOTH AEROBIC AND ANAEROBIC BOTTLES CRITICAL RESULT CALLED TO, READ BACK BY AND VERIFIED WITH: N GLOGOVAC,PHARMD AT 1497 02/10/17 BY L BENFIELD    Culture (A)  Final    KLEBSIELLA PNEUMONIAE PROTEUS MIRABILIS CRITICAL RESULT CALLED TO, READ BACK BY AND VERIFIED WITH: N [place] 02/12/17 @ 0900 M VESTAL Performed at [facility], [place] [address], [place], [place] [address]    Report Status PENDING  Incomplete  Urine culture     Status: None   Collection Time: 02/09/17 12:30 PM  Result Value Ref Range Status   Specimen Description URINE, CLEAN CATCH  Final   Special Requests NONE  Final   Culture   Final    NO GROWTH Performed at [facility], [address], [place], [place] [address]    Report Status 02/10/2017 FINAL  Final  Body fluid culture     Status: Abnormal   Collection Time: 02/09/17  3:25 PM  Result Value Ref Range Status   Specimen Description COMMON BILE DUCT  Final   Special Requests Immunocompromised  Final   Gram Stain   Final    ABUNDANT WBC PRESENT, PREDOMINANTLY PMN ABUNDANT GRAM NEGATIVE RODS ABUNDANT GRAM POSITIVE COCCI IN PAIRS  IN CHAINS ABUNDANT GRAM POSITIVE RODS Performed at [facility], [place] [address] [place], [place] [address]    Culture (A)  Final    MULTIPLE ORGANISMS PRESENT, NONE PREDOMINANT AEROBICALLY ABUNDANT CLOSTRIDIUM PERFRINGENS    Report Status 02/11/2017 FINAL  Final      Radiology Studies: No results found.  Scheduled Meds: . enoxaparin (LOVENOX) injection  40 mg Subcutaneous Q24H  . insulin aspart  0-9 Units Subcutaneous TID WC  . insulin detemir  30 Units Subcutaneous Daily  . lipase/protease/amylase  36,000 Units Oral TID AC  . loratadine  10 mg Oral Daily  . multivitamin with minerals  1 tablet Oral Daily  . pantoprazole  40 mg Oral Daily   Continuous Infusions: . cefTRIAXone (ROCEPHIN)  IV Stopped (02/11/17 1234)     LOS: 3 days   Time spent: 25 minutes.  Vance Gather, MD [facility] Pager [phone]  If 7PM-7AM, please contact night-coverage [email or web address] Password [facility] 02/12/2017, 9:56 AM

## 2017-02-13 LAB — COMPREHENSIVE METABOLIC PANEL
ALT: 62 U/L (ref 17–63)
AST: 70 U/L — AB (ref 15–41)
Albumin: 2 g/dL — ABNORMAL LOW (ref 3.5–5.0)
Alkaline Phosphatase: 825 U/L — ABNORMAL HIGH (ref 38–126)
Anion gap: 9 (ref 5–15)
BUN: 11 mg/dL (ref 6–20)
CHLORIDE: 103 mmol/L (ref 101–111)
CO2: 23 mmol/L (ref 22–32)
CREATININE: 0.67 mg/dL (ref 0.61–1.24)
Calcium: 8 mg/dL — ABNORMAL LOW (ref 8.9–10.3)
GFR calc Af Amer: >60 mL/min (ref >60)
Glucose, Bld: 150 mg/dL — ABNORMAL HIGH (ref 65–99)
Potassium: 3.3 mmol/L — ABNORMAL LOW (ref 3.5–5.1)
SODIUM: 135 mmol/L (ref 135–145)
Total Bilirubin: 0.7 mg/dL (ref 0.3–1.2)
Total Protein: 5.2 g/dL — ABNORMAL LOW (ref 6.5–8.1)

## 2017-02-13 LAB — CBC
HCT: 26.1 % — ABNORMAL LOW (ref 39.0–52.0)
Hemoglobin: 8.5 g/dL — ABNORMAL LOW (ref 13.0–17.0)
MCH: 27.9 pg (ref 26.0–34.0)
MCHC: 32.6 g/dL (ref 30.0–36.0)
MCV: 85.6 fL (ref 78.0–100.0)
PLATELETS: 184 10*3/uL (ref 150–400)
RBC: 3.05 MIL/uL — AB (ref 4.22–5.81)
RDW: 15.5 % (ref 11.5–15.5)
WBC: 4.6 10*3/uL (ref 4.0–10.5)

## 2017-02-13 LAB — GLUCOSE, CAPILLARY
GLUCOSE-CAPILLARY: 110 mg/dL — AB (ref 65–99)
GLUCOSE-CAPILLARY: 231 mg/dL — AB (ref 65–99)
Glucose-Capillary: 141 mg/dL — ABNORMAL HIGH (ref 65–99)
Glucose-Capillary: 288 mg/dL — ABNORMAL HIGH (ref 65–99)

## 2017-02-13 MED ORDER — FUROSEMIDE 10 MG/ML IJ SOLN
20.0000 mg | Freq: Once | INTRAMUSCULAR | Status: AC
  Administered 2017-02-14: 20 mg via INTRAVENOUS
  Filled 2017-02-13: qty 2

## 2017-02-13 MED ORDER — FUROSEMIDE 10 MG/ML IJ SOLN
20.0000 mg | Freq: Once | INTRAMUSCULAR | Status: AC
  Administered 2017-02-13: 20 mg via INTRAVENOUS
  Filled 2017-02-13: qty 2

## 2017-02-13 MED ORDER — POTASSIUM CHLORIDE CRYS ER 20 MEQ PO TBCR
40.0000 meq | EXTENDED_RELEASE_TABLET | Freq: Once | ORAL | Status: AC
  Administered 2017-02-13: 40 meq via ORAL
  Filled 2017-02-13: qty 2

## 2017-02-13 MED ORDER — OXYCODONE HCL 5 MG PO TABS
5.0000 mg | ORAL_TABLET | ORAL | Status: DC | PRN
Start: 2017-02-13 — End: 2017-02-16

## 2017-02-13 MED ORDER — TRAMADOL HCL 50 MG PO TABS
50.0000 mg | ORAL_TABLET | Freq: Four times a day (QID) | ORAL | Status: DC | PRN
Start: 2017-02-13 — End: 2017-02-16

## 2017-02-13 NOTE — Care Management Important Message (Signed)
Important Message  Patient Details  Name: Jacob Hardin MRN: [id number] Date of Birth: [birth year]   Medicare Important Message Given:  Yes    Kerin Salen 02/13/2017, 10:50 AMImportant Message  Patient Details  Name: Jacob Hardin MRN: [id number] Date of Birth: 1946-01-22   Medicare Important Message Given:  Yes    Kerin Salen 02/13/2017, 10:50 AM

## 2017-02-13 NOTE — Progress Notes (Signed)
Referring Physician(s): B. Sherrill  Supervising Physician: Arne Cleveland  Patient Status:  [facility] - In-pt  Chief Complaint:  Metastatic pancreatic cancer, biliary obstruction, bacteremia  Subjective:  Jacob Hardin is doing well today. He is sitting up in bed and feels pretty good.  As I was discussing the plans for his drain, he tells me he is very nervous about ever having his biliary drain removed.  He is actually a bit nervous that it is capped and asked if he could have a gravity bag just in case he needed it. I did supply him with this.  Allergies: Codeine  Medications: Prior to Admission medications   Medication Sig Start Date End Date Taking? Authorizing Provider  acetaminophen (TYLENOL) 500 MG tablet Take 1,000 mg by mouth every 6 (six) hours as needed.   Yes Historical Provider, MD  baclofen (LIORESAL) 10 MG tablet Take 5 mg by mouth 3 (three) times daily as needed (for hiccups).   Yes Historical Provider, MD  esomeprazole (NEXIUM) 40 MG capsule Take 40 mg by mouth daily.   Yes Historical Provider, MD  hydrOXYzine (ATARAX/VISTARIL) 25 MG tablet Take 1 tablet (25 mg total) by mouth every 8 (eight) hours as needed for itching. 12/06/16  Yes Ladell Pier, MD  insulin aspart (NOVOLOG FLEXPEN) 100 UNIT/ML FlexPen Inject 4 Units into the skin daily before supper.   Yes Historical Provider, MD  Insulin Detemir (LEVEMIR FLEXTOUCH) 100 UNIT/ML Pen Inject 40 Units into the skin daily.   Yes Historical Provider, MD  lidocaine-prilocaine (EMLA) cream Apply 1 application topically as needed (prior to accessing port).   Yes Historical Provider, MD  lipase/protease/amylase (CREON) 36000 UNITS CPEP capsule Take 1 capsule (36,000 Units total) by mouth 3 (three) times daily before meals. 02/07/17  Yes Ladell Pier, MD  loperamide (IMODIUM) 2 MG capsule Take 2 mg by mouth as needed for diarrhea or loose stools.   Yes Historical Provider, MD  loratadine (CLARITIN) 10 MG tablet Take 10  mg by mouth daily.    Yes Historical Provider, MD  Multiple Vitamin (MULTIVITAMIN WITH MINERALS) TABS tablet Take 1 tablet by mouth daily.   Yes Historical Provider, MD  ondansetron (ZOFRAN) 8 MG tablet Take 8 mg by mouth every 6 (six) hours as needed for nausea or vomiting.    Yes Historical Provider, MD  polyethylene glycol (MIRALAX / GLYCOLAX) packet Take 17 g by mouth daily as needed for mild constipation.    Yes Historical Provider, MD  prochlorperazine (COMPAZINE) 10 MG tablet Take 1 tablet (10 mg total) by mouth every 6 (six) hours as needed for nausea or vomiting. 12/02/15  Yes Ladell Pier, MD  BD PEN NEEDLE NANO U/F 32G X 4 MM MISC USE TO INJECT INSULIN TWICE A DAY 11/06/16   [name], MD  traMADol (ULTRAM) 50 MG tablet Take 1 tablet (50 mg total) by mouth every 6 (six) hours as needed. Patient not taking: Reported on 01/21/2017 12/31/16   Ladell Pier, MD     Vital Signs: BP (!) 140/91 (BP Location: Left Arm)   Pulse 67   Temp 98.2 F (36.8 C) (Oral)   Resp 18   Ht 6' (1.829 m)   Wt 171 lb 1.6 oz (77.6 kg)   SpO2 98%   BMI 23.21 kg/m   Physical Exam Awake and alert Abdomen soft, NT Drain in place and  Capped  Imaging: Ir Exchange Biliary Drain  Result Date: 02/12/2017 INDICATION: History of metastatic pancreatic  cancer, post biliary drainage catheter placement on 11/25/2015 with subsequent placement of an internal stent on 12/13/2015. Unfortunately, the patient's biliary stent occluded and as such the patient had undergo repeat for acute biliary drainage catheter placement on 10/16/2016. Patient subsequently underwent endoscopic placement of a internal biliary stent on 10/26/2016 - note, the endoscopic stent was placed alongside the existing biliary drain. Patient recently admitted with concern for sepsis though currently is improved. He presents today for returns today for fluoroscopic guided biliary drainage catheter exchange EXAM: FLUOROSCOPIC GUIDED RIGHT-SIDED  PERCUTANEOUS BILIARY DRAINAGE CATHETER COMPARISON:  COMPARISON Ultrasound fluoroscopic guided biliary drainage catheter placement - 11/25/2015; 10/16/2016; fluoroscopic guided biliary stent placement - 12/13/2015; endoscopic placement of a internal biliary stent - 10/26/2016; percutaneous cholangiogram and biliary drainage catheter exchange - 11/07/2016 CONTRAST:  15 mL Isovue -300 administered into the biliary system FLUOROSCOPY TIME:  1 minute (24 mGy) COMPLICATIONS: None immediate. TECHNIQUE: Informed written consent was obtained from the patient after a discussion of the risks, benefits and alternatives to treatment. Questions regarding the procedure were encouraged and answered. A timeout was performed prior to the initiation of the procedure. The external portion of the existing right-sided percutaneous biliary drainage catheter as well as the surrounding skin were prepped and draped in the usual sterile fashion. A sterile drape was applied covering the operative field. Maximum barrier sterile technique with sterile gowns and gloves were used for the procedure. A timeout was performed prior to the initiation of the procedure. A pre procedural spot fluoroscopic image was obtained after contrast was injected via the existing biliary drainage catheter. The existing biliary drainage catheter was cut and cannulated with a stiff Glidewire wire which was coiled within the proximal small bowel. Under intermittent fluoroscopic guidance, the existing PBD was exchanged for a new Ettrick PBD. Small amount of contrast was injected via the exchanged biliary drainage catheter and a post exchange spot fluoroscopic image was obtained. The catheter was locked and secured to the skin with a single interrupted suture. The biliary drainage catheter was capped. A dressing was placed. The patient tolerated the procedure well without immediate postprocedural complication. FINDINGS: Preprocedural spot fluoroscopic image was  obtained of the right upper abdominal quadrant existing percutaneous biliary drainage catheter Images demonstrate grossly unchanged positioning of percutaneous biliary drainage catheter coursing along the endoscopically placed stent within the CBD. An additional stent is seen within descending portion of the duodenum. Contrast injection demonstrates decompression of the biliary system with passage of contrast preferentially through the endoscopically placed biliary stent. There is persistent mild narrowing involving the biliary hilum, cranial to the endoscopically placed stent. After successful fluoroscopic guided exchange, the new 12 French percutaneous biliary catheter is appropriately positioned with end coiled and locked within the proximal duodenum. IMPRESSION: Successful fluoroscopic guided exchange of right sided 12 French percutaneous biliary drainage catheter. PLAN: - Above discussed with referring gastroenterologist, Dr. [name]. - The patient will return for routine fluoroscopic guided percutaneous biliary drainage catheter exchange in approximately 1 month with repeat CMP level at that time. - At that time, the biliary drainage catheter will be exchange for a regular all-purpose drainage catheter which will be coiled and locked cranial to biliary stent. The all-purpose drainage catheter will be capped for a trial of internalization. - The patient will return to the interventional radiology department 1 week following this catheter conversion with a repeat CMP level and repeat cholangiogram. If the patient tolerates this trial of percutaneous biliary drainage capping, the drainage catheter may at that time. -  If the patient experiences another episode of sepsis, he will likely have to continue with routine fluoroscopic guided percutaneous biliary drainage catheter exchanges. Electronically Signed   By: Sandi Mariscal M.D.   On: 02/12/2017 17:15    Labs:  CBC:  Recent Labs  02/09/17 0751 02/10/17 0511  02/12/17 0606 02/13/17 0539  WBC 3.9* 6.9 4.8 4.6  HGB 8.3* 7.6* 9.1* 8.5*  HCT 24.4* 22.6* 27.5* 26.1*  PLT 175 168 184 184    COAGS:  Recent Labs  10/12/16 2021 11/07/16 0945 02/09/17 0751  INR 0.98 1.01 1.01  APTT 30  --   --     BMP:  Recent Labs  02/09/17 0751 02/10/17 0511 02/12/17 0606 02/13/17 0539  NA 133* 133* 134* 135  K 3.4* 4.3 3.5 3.3*  CL 101 106 106 103  CO2 22 22 22 23   GLUCOSE 77 246* 236* 150*  BUN 18 16 10 11   CALCIUM 8.1* 7.8* 7.9* 8.0*  CREATININE 1.05 0.79 0.73 0.67  GFRNONAA >60 >60 >60 >60  GFRAA >60 >60 >60 >60    LIVER FUNCTION TESTS:  Recent Labs  02/09/17 0751 02/10/17 0511 02/12/17 0606 02/13/17 0539  BILITOT 2.2* 1.3* 1.1 0.7  AST 102* 61* 58* 70*  ALT 82* 64* 60 62  ALKPHOS 833* 569* 724* 825*  PROT 5.7* 5.1* 5.5* 5.2*  ALBUMIN 2.4* 2.0* 2.1* 2.0*    Assessment and Plan:  History of metastatic pancreatic cancer, post biliary drainage catheter placement on 11/25/2015 with subsequent placement of an internal stent on 12/13/2015.  Unfortunately, the patient's biliary stent occluded and as such the patient had undergo repeat for acute biliary drainage catheter placement on 10/16/2016.   He subsequently underwent endoscopic placement of a internal biliary stent on 10/26/2016 - the endoscopic stent was placed alongside the existing biliary drain.  He was admitted on 02/09/2017 with concern for sepsis.  He underwent fluoroscopic guided biliary drainage catheter exchange by  Dr. Pascal Lux yesterday.  The plan is for the patient to return for routine fluoroscopic guided percutaneous biliary drainage catheter exchange in approximately 1 month with repeat CMP level at that time.  At that time, the biliary drainage catheter will be exchange for a regular all-purpose drainage catheter which will be coiled and locked cranial to biliary stent. The all-purpose drainage catheter will be capped for a trial of internalization.  He  will then return to the interventional radiology department 1 week following this catheter conversion with a repeat CMP level and repeat cholangiogram.  If the he tolerates this trial of percutaneous biliary drainage capping, the drainage catheter may be removed at that time.  If the patient experiences another episode of sepsis, he will likely have to continue with routine fluoroscopic guided percutaneous biliary drainage catheter exchanges.   Electronically Signed: Murrell Redden 02/13/2017, 1:04 PM   I spent a total of 15 Minutes at the the patient's bedside AND on the patient's hospital floor or unit, greater than 50% of which was counseling/coordinating care for care for biliary drain.

## 2017-02-13 NOTE — Progress Notes (Signed)
Pt from home with spouse and was independent with ADLs prior to admission. Pt had biliary drain which wife was flushing weekly. CM will follow for DC needs. Marney Doctor RN,BSN,NCM [phone]

## 2017-02-13 NOTE — Progress Notes (Signed)
PROGRESS NOTE  Jacob Hardin  WUJ:[id number] DOB: Feb 15, 1946 DOA: 02/09/2017   PCP: [name], MD   Outpatient Specialists: Dr. [name], GI Dr. 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 He had no other significant symptoms, specifically no abdominal pain, nausea, vomiting, cough, dyspnea, chest pain, urinary complaints, or wounds. Biliary drain has been flushed weekly by his wife with no reported complications. On arrival, he was febrile and hypotensive with clear CXR, started on broad-spectrum antibiotics and given IV fluids which improved blood pressure. Repeat lactate normalized and he was admitted to [facility] for sepsis with uncertain source. Blood cultures have grown Klebsiella in 2 of 2, so antibiotics were narrowed to ceftriaxone pending sensitivity data. IR was consulted for biliary drain evaluation and is planning exchange during hospitalization. This required anesthesia in the past.  Assessment & Plan:  Sepsis due to Klebsiella and Proteus bacteremia - Likely biliary source without significant LFT elevation from baseline. Lactate has normalized and vital signs are stable. UCx negative. CXR clear. Port site appears normal. Drain site uninfected and culture grew C. perfringens, though this is not felt to be contributing to pathogenicity at this time. - Started vanc/zosyn 4/21 > ceftriaxone 4/22. Dr. Auburn Bilberry Discussed with ID, Dr. Johnnye Sima, based on susceptibilities, he recommends 7 days of IV therapy (continue ceftriaxone, last daily dose will be 4/27) followed by 7 days of oral therapy (e.g. keflex).   Cancer of head of pancreas, metastasized to liver and duodenum - Patient has been followed up by  Dr. [name]. Patient is on chemotherapy - No evidence of biliary obstruction at this time, IR exchanged drain 4/24 - Continue creon with meals. 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  T2DM - Last A1c 7.0 on 09/19/15, well controlled. Patient is [place] decreased 44u to 40u 4/22, hypoglycemic episode 4/23, decreased to 30u qHS. - Carb-modified diet.  Moderate malnutrition in setting of chronic illness: - Nutrition consulted, continue home supplements. - so far improved appetite and tolerating diet well   Pulmonary nodule RUL: Noted on CXR at admission, suspected granuloma.  - Repeat imaging recommended in 3 - 6 months. Defer to Dr. [name].   DVT prophylaxis: Lovenox Code Status: DNR Family Communication: Wife at bedside this AM Disposition Plan: DC to home 4/27  Consultants:   Dr. [name], oncology  Dr. Pascal Lux, IR  Procedures:   1u PRBCs 4/22  Antimicrobials:  Vanc/zosyn 4/21 > 4/22  Ceftriaxone 4/22 >>    Subjective: Pt ambulating in halls, no fevers'chills in past 24 hours. Abd feels "full" without tenderness.   Objective: Vitals:   02/12/17 1635 02/12/17 1654 02/12/17 2009 02/13/17 0549  BP: (!) 142/85 138/79 (!) 156/91 (!) 140/91  Pulse: 69 65 66 67  Resp: 19 18 18 18   Temp:  97.6  F (36.4 C) 98.2 F (36.8 C) 98.2 F (36.8 C)  TempSrc:   Oral Oral  SpO2: 97% 98% 99% 98%  Weight:    77.6 kg (171 lb 1.6 oz)  Height:        Intake/Output Summary (Last 24 hours) at 02/13/17 1306 Last data filed at  02/13/17 0930  Gross per 24 hour  Intake              480 ml  Output                0 ml  Net              480 ml   Filed Weights   02/11/17 0545 02/12/17 0520 02/13/17 0549  Weight: 70.4 kg (155 lb 3.3 oz) 69.9 kg (154 lb) 77.6 kg (171 lb 1.6 oz)    Examination: General exam: Thin 71 y.o. male walking in halls in no distress Respiratory system: Non-labored breathing room air. Clear to auscultation bilaterally.  Cardiovascular system: Regular rate and rhythm. No murmur, rub, or gallop. No JVD, and no pedal edema. Gastrointestinal system: Abdomen soft, non-tender, mildly distended, with normoactive bowel sounds. No organomegaly or masses felt. Central nervous system: Alert and oriented. No focal neurological deficits. Extremities: Warm, no deformities Skin: Port in right upper chest c/d/i without erythema. RUQ biliary drain site without erythema, drainage/induration or tenderness.  Psychiatry: Judgement and insight appear normal. Mood & affect appropriate.   Data Reviewed: I have personally reviewed following labs and imaging studies  CBC:  Recent Labs Lab 02/09/17 0751 02/10/17 0511 02/12/17 0606 02/13/17 0539  WBC 3.9* 6.9 4.8 4.6  NEUTROABS 3.6  --   --   --   HGB 8.3* 7.6* 9.1* 8.5*  HCT 24.4* 22.6* 27.5* 26.1*  MCV 88.4 89.0 85.7 85.6  PLT 175 168 184 294   Basic Metabolic Panel:  Recent Labs Lab 02/09/17 0751 02/10/17 0511 02/12/17 0606 02/13/17 0539  NA 133* 133* 134* 135  K 3.4* 4.3 3.5 3.3*  CL 101 106 106 103  CO2 22 22 22 23   GLUCOSE 77 246* 236* 150*  BUN 18 16 10 11   CREATININE 1.05 0.79 0.73 0.67  CALCIUM 8.1* 7.8* 7.9* 8.0*   Liver Function Tests:  Recent Labs Lab 02/09/17 0751 02/10/17 0511 02/12/17 0606 02/13/17 0539  AST 102* 61* 58* 70*  ALT 82* 64* 60 62  ALKPHOS 833* 569* 724* 825*  BILITOT 2.2* 1.3* 1.1 0.7  PROT 5.7* 5.1* 5.5* 5.2*  ALBUMIN 2.4* 2.0* 2.1* 2.0*   Coagulation Profile:  Recent Labs Lab 02/09/17 0751  INR  1.01   CBG:  Recent Labs Lab 02/12/17 1355 02/12/17 1700 02/12/17 2119 02/13/17 0755 02/13/17 1214  GLUCAP 247* 146* 171* 141* 288*   Urine analysis:    Component Value Date/Time   COLORURINE YELLOW 02/09/2017 [place] 02/09/2017 0752   LABSPEC 1.010 02/09/2017 0752   PHURINE 5.0 02/09/2017 0752   GLUCOSEU NEGATIVE 02/09/2017 0752   HGBUR MODERATE (A) 02/09/2017 0752   BILIRUBINUR NEGATIVE 02/09/2017 0752   KETONESUR NEGATIVE 02/09/2017 0752   PROTEINUR NEGATIVE 02/09/2017 0752   NITRITE NEGATIVE 02/09/2017 0752   LEUKOCYTESUR NEGATIVE 02/09/2017 0752   Recent Results (from the past 240 hour(s))  Culture, blood (Routine x 2)     Status: Abnormal   Collection Time: 02/09/17  7:30 AM  Result Value Ref Range Status   Specimen Description BLOOD BLOOD RIGHT FOREARM  Final   Special Requests  Final    BOTTLES DRAWN AEROBIC AND ANAEROBIC Blood Culture adequate volume   Culture  Setup Time   Final    GRAM NEGATIVE RODS ANAEROBIC BOTTLE ONLY CRITICAL RESULT CALLED TO, READ BACK BY AND VERIFIED WITH: N GLOGOVAC,PHARMD AT 8502 02/10/17 BY L BENFIELD Performed at [facility], [place] [address] [place], [place] [address]    Culture KLEBSIELLA PNEUMONIAE (A)  Final   Report Status 02/12/2017 FINAL  Final   Organism ID, Bacteria KLEBSIELLA PNEUMONIAE  Final      Susceptibility   Klebsiella pneumoniae - MIC*    AMPICILLIN >=32 RESISTANT Resistant     CEFAZOLIN <=4 SENSITIVE Sensitive     CEFEPIME <=1 SENSITIVE Sensitive     CEFTAZIDIME <=1 SENSITIVE Sensitive     CEFTRIAXONE <=1 SENSITIVE Sensitive     CIPROFLOXACIN <=0.25 SENSITIVE Sensitive     GENTAMICIN <=1 SENSITIVE Sensitive     IMIPENEM <=0.25 SENSITIVE Sensitive     TRIMETH/SULFA <=20 SENSITIVE Sensitive     AMPICILLIN/SULBACTAM 4 SENSITIVE Sensitive     PIP/TAZO <=4 SENSITIVE Sensitive     Extended ESBL NEGATIVE Sensitive     * KLEBSIELLA PNEUMONIAE  Blood Culture ID Panel (Reflexed)      Status: Abnormal   Collection Time: 02/09/17  7:30 AM  Result Value Ref Range Status   Enterococcus species NOT DETECTED NOT DETECTED Final   Listeria monocytogenes NOT DETECTED NOT DETECTED Final   Staphylococcus species NOT DETECTED NOT DETECTED Final   Staphylococcus aureus NOT DETECTED NOT DETECTED Final   Streptococcus species NOT DETECTED NOT DETECTED Final   Streptococcus agalactiae NOT DETECTED NOT DETECTED Final   Streptococcus pneumoniae NOT DETECTED NOT DETECTED Final   Streptococcus pyogenes NOT DETECTED NOT DETECTED Final   Acinetobacter baumannii NOT DETECTED NOT DETECTED Final   Enterobacteriaceae species DETECTED (A) NOT DETECTED Final    Comment: Enterobacteriaceae represent a large family of gram-negative bacteria, not a single organism. CRITICAL RESULT CALLED TO, READ BACK BY AND VERIFIED WITH: N GLOGOVAC,PHARMD AT 0931 02/10/17 BY L BENFIELD    Enterobacter cloacae complex NOT DETECTED NOT DETECTED Final   Escherichia coli NOT DETECTED NOT DETECTED Final   Klebsiella oxytoca NOT DETECTED NOT DETECTED Final   Klebsiella pneumoniae DETECTED (A) NOT DETECTED Final    Comment: CRITICAL RESULT CALLED TO, READ BACK BY AND VERIFIED WITH: N GLOGOVAC,PHARMD AT 0931 02/10/17 BY L BENFIELD    Proteus species NOT DETECTED NOT DETECTED Final   Serratia marcescens NOT DETECTED NOT DETECTED Final   Carbapenem resistance NOT DETECTED NOT DETECTED Final   Haemophilus influenzae NOT DETECTED NOT DETECTED Final   Neisseria meningitidis NOT DETECTED NOT DETECTED Final   Pseudomonas aeruginosa NOT DETECTED NOT DETECTED Final   Candida albicans NOT DETECTED NOT DETECTED Final   Candida glabrata NOT DETECTED NOT DETECTED Final   Candida krusei NOT DETECTED NOT DETECTED Final   Candida parapsilosis NOT DETECTED NOT DETECTED Final   Candida tropicalis NOT DETECTED NOT DETECTED Final    Comment: Performed at [facility], [place]. [address], [place], [place] [address]  Culture, blood  (Routine x 2)     Status: Abnormal   Collection Time: 02/09/17  7:40 AM  Result Value Ref Range Status   Specimen Description BLOOD LEFT ANTECUBITAL  Final   Special Requests   Final    Blood Culture results may not be optimal due to an excessive volume of blood received in culture bottles   Culture  Setup Time  Final    GRAM NEGATIVE RODS IN BOTH AEROBIC AND ANAEROBIC BOTTLES CRITICAL RESULT CALLED TO, READ BACK BY AND VERIFIED WITH: N GLOGOVAC,PHARMD AT 9449 02/10/17 BY L BENFIELD    Culture (A)  Final    KLEBSIELLA PNEUMONIAE PROTEUS MIRABILIS CRITICAL RESULT CALLED TO, READ BACK BY AND VERIFIED WITH: N [place] 02/12/17 @ 0900 M VESTAL SUSCEPTIBILITIES PERFORMED ON PREVIOUS CULTURE WITHIN THE LAST 5 DAYS. Performed at [facility], [place] [address] [place], [place] [address]    Report Status 02/12/2017 FINAL  Final   Organism ID, Bacteria PROTEUS MIRABILIS  Final      Susceptibility   Proteus mirabilis - MIC*    AMPICILLIN <=2 SENSITIVE Sensitive     CEFAZOLIN <=4 SENSITIVE Sensitive     CEFEPIME <=1 SENSITIVE Sensitive     CEFTAZIDIME <=1 SENSITIVE Sensitive     CEFTRIAXONE <=1 SENSITIVE Sensitive     CIPROFLOXACIN <=0.25 SENSITIVE Sensitive     GENTAMICIN <=1 SENSITIVE Sensitive     IMIPENEM 4 SENSITIVE Sensitive     TRIMETH/SULFA <=20 SENSITIVE Sensitive     AMPICILLIN/SULBACTAM <=2 SENSITIVE Sensitive     PIP/TAZO <=4 SENSITIVE Sensitive     * PROTEUS MIRABILIS  Urine culture     Status: None   Collection Time: 02/09/17 12:30 PM  Result Value Ref Range Status   Specimen Description URINE, CLEAN CATCH  Final   Special Requests NONE  Final   Culture   Final    NO GROWTH Performed at [facility], [place] [address] [place], [place] [address]    Report Status 02/10/2017 FINAL  Final  Body fluid culture     Status: Abnormal   Collection Time: 02/09/17  3:25 PM  Result Value Ref Range Status   Specimen Description COMMON BILE DUCT  Final   Special Requests  Immunocompromised  Final   Gram Stain   Final    ABUNDANT WBC PRESENT, PREDOMINANTLY PMN ABUNDANT GRAM NEGATIVE RODS ABUNDANT GRAM POSITIVE COCCI IN PAIRS IN CHAINS ABUNDANT GRAM POSITIVE RODS Performed at [facility], [place] [address], [place], [place] [address]    Culture (A)  Final    MULTIPLE ORGANISMS PRESENT, NONE PREDOMINANT AEROBICALLY ABUNDANT CLOSTRIDIUM PERFRINGENS    Report Status 02/11/2017 FINAL  Final      Radiology Studies: Ir Exchange Biliary Drain  Result Date: 02/12/2017 INDICATION: History of metastatic pancreatic cancer, post biliary drainage catheter placement on 11/25/2015 with subsequent placement of an internal stent on 12/13/2015. Unfortunately, the patient's biliary stent occluded and as such the patient had undergo repeat for acute biliary drainage catheter placement on 10/16/2016. Patient subsequently underwent endoscopic placement of a internal biliary stent on 10/26/2016 - note, the endoscopic stent was placed alongside the existing biliary drain. Patient recently admitted with concern for sepsis though currently is improved. He presents today for returns today for fluoroscopic guided biliary drainage catheter exchange EXAM: FLUOROSCOPIC GUIDED RIGHT-SIDED PERCUTANEOUS BILIARY DRAINAGE CATHETER COMPARISON:  COMPARISON Ultrasound fluoroscopic guided biliary drainage catheter placement - 11/25/2015; 10/16/2016; fluoroscopic guided biliary stent placement - 12/13/2015; endoscopic placement of a internal biliary stent - 10/26/2016; percutaneous cholangiogram and biliary drainage catheter exchange - 11/07/2016 CONTRAST:  15 mL Isovue -300 administered into the biliary system FLUOROSCOPY TIME:  1 minute (24 mGy) COMPLICATIONS: None immediate. TECHNIQUE: Informed written consent was obtained from the patient after a discussion of the risks, benefits and alternatives to treatment. Questions regarding the procedure were encouraged and answered. A timeout was performed  prior to the initiation of the procedure. The external portion of the existing right-sided percutaneous biliary drainage catheter as well as the surrounding skin were prepped and draped in the usual sterile fashion. A sterile drape was applied covering the operative field. Maximum barrier sterile technique with sterile gowns and gloves were used for the procedure. A timeout was performed prior to the initiation of the procedure. A pre procedural spot fluoroscopic image was obtained after contrast was injected via the existing biliary drainage catheter. The existing biliary drainage catheter was cut and cannulated with a stiff Glidewire wire which was coiled within the proximal small bowel. Under intermittent fluoroscopic guidance, the existing PBD was exchanged for a new [place] PBD. Small amount of contrast was injected via the exchanged biliary drainage catheter and a post exchange spot fluoroscopic image was obtained. The catheter was locked and secured to the skin with a single interrupted suture. The biliary drainage catheter was capped. A dressing was placed. The patient tolerated the procedure well without immediate postprocedural complication. FINDINGS: Preprocedural spot fluoroscopic image was obtained of the right upper abdominal quadrant existing percutaneous biliary drainage catheter Images demonstrate grossly unchanged positioning of percutaneous biliary drainage catheter coursing along the endoscopically placed stent within the CBD. An additional stent is seen within descending portion of the duodenum. Contrast injection demonstrates decompression of the biliary system with passage of contrast preferentially through the endoscopically placed biliary stent. There is persistent mild narrowing involving the biliary hilum, cranial to the endoscopically placed stent. After successful fluoroscopic guided exchange, the new 12 French percutaneous biliary catheter is appropriately positioned with end coiled  and locked within the proximal duodenum. IMPRESSION: Successful fluoroscopic guided exchange of right sided 12 French percutaneous biliary drainage catheter. PLAN: - Above discussed with referring gastroenterologist, Dr. [name]. - The patient will return for routine fluoroscopic guided percutaneous biliary drainage catheter exchange in approximately 1 month with repeat CMP level at that time. - At that time, the biliary drainage catheter will be exchange for a regular all-purpose drainage catheter which will be coiled and locked cranial to biliary stent. The all-purpose drainage catheter will be capped for a trial of internalization. - The patient will return to the interventional radiology department 1 week following this catheter conversion with a repeat CMP level and repeat cholangiogram. If the patient tolerates this trial of percutaneous biliary drainage capping, the drainage catheter may at that time. - If the patient experiences another episode of sepsis, he will likely have to continue with routine fluoroscopic guided percutaneous biliary drainage catheter exchanges. Electronically Signed   By: Sandi Mariscal M.D.   On: 02/12/2017 17:15    Scheduled Meds: . enoxaparin (LOVENOX) injection  40 mg Subcutaneous Q24H  . insulin aspart  0-15 Units Subcutaneous TID WC  . insulin aspart  0-5 Units Subcutaneous QHS  . insulin detemir  30 Units Subcutaneous Daily  . lipase/protease/amylase  36,000 Units Oral TID AC  . loratadine  10 mg Oral Daily  . multivitamin with minerals  1 tablet Oral Daily  . pantoprazole  40 mg Oral Daily   Continuous Infusions: . cefTRIAXone (ROCEPHIN)  IV 2 g (02/13/17 1253)     LOS: 4 days   Time spent: 25 minutes.  Faye Ramsay, MD [facility] Pager [phone]  If 7PM-7AM, please contact night-coverage [email or web address] Password [email or web address] 02/13/2017, 1:06 PM

## 2017-02-14 LAB — BASIC METABOLIC PANEL
ANION GAP: 6 (ref 5–15)
BUN: 10 mg/dL (ref 6–20)
CO2: 28 mmol/L (ref 22–32)
CREATININE: 0.72 mg/dL (ref 0.61–1.24)
Calcium: 8.3 mg/dL — ABNORMAL LOW (ref 8.9–10.3)
Chloride: 101 mmol/L (ref 101–111)
Glucose, Bld: 181 mg/dL — ABNORMAL HIGH (ref 65–99)
Potassium: 3.7 mmol/L (ref 3.5–5.1)
SODIUM: 135 mmol/L (ref 135–145)

## 2017-02-14 LAB — CBC
HEMATOCRIT: 27.3 % — AB (ref 39.0–52.0)
Hemoglobin: 8.9 g/dL — ABNORMAL LOW (ref 13.0–17.0)
MCH: 27.8 pg (ref 26.0–34.0)
MCHC: 32.6 g/dL (ref 30.0–36.0)
MCV: 85.3 fL (ref 78.0–100.0)
Platelets: 229 10*3/uL (ref 150–400)
RBC: 3.2 MIL/uL — ABNORMAL LOW (ref 4.22–5.81)
RDW: 15.5 % (ref 11.5–15.5)
WBC: 6.9 10*3/uL (ref 4.0–10.5)

## 2017-02-14 LAB — GLUCOSE, CAPILLARY
GLUCOSE-CAPILLARY: 104 mg/dL — AB (ref 65–99)
GLUCOSE-CAPILLARY: 144 mg/dL — AB (ref 65–99)
GLUCOSE-CAPILLARY: 233 mg/dL — AB (ref 65–99)
Glucose-Capillary: 105 mg/dL — ABNORMAL HIGH (ref 65–99)
Glucose-Capillary: 159 mg/dL — ABNORMAL HIGH (ref 65–99)
Glucose-Capillary: 161 mg/dL — ABNORMAL HIGH (ref 65–99)

## 2017-02-14 LAB — C DIFFICILE QUICK SCREEN W PCR REFLEX
C DIFFICILE (CDIFF) INTERP: DETECTED
C DIFFICILE (CDIFF) TOXIN: POSITIVE — AB
C DIFFICLE (CDIFF) ANTIGEN: POSITIVE — AB

## 2017-02-14 MED ORDER — VANCOMYCIN 50 MG/ML ORAL SOLUTION
125.0000 mg | Freq: Four times a day (QID) | ORAL | Status: DC
  Administered 2017-02-14 – 2017-02-16 (×6): 125 mg via ORAL
  Filled 2017-02-14 (×8): qty 2.5

## 2017-02-14 NOTE — Progress Notes (Signed)
PROGRESS NOTE  Jacob Hardin  AST:[id number] DOB: 1946-02-05 DOA: 02/09/2017   PCP: [name], MD   Outpatient Specialists: Dr. [name], GI Dr. 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 He had no other significant symptoms, specifically no abdominal pain, nausea, vomiting, cough, dyspnea, chest pain, urinary complaints, or wounds. Biliary drain has been flushed weekly by his wife with no reported complications. On arrival, he was febrile and hypotensive with clear CXR, started on broad-spectrum antibiotics and given IV fluids which improved blood pressure. Repeat lactate normalized and he was admitted to [facility] for sepsis with uncertain source. Blood cultures have grown Klebsiella in 2 of 2, so antibiotics were narrowed to ceftriaxone pending sensitivity data. IR was consulted for biliary drain evaluation and is planning exchange during hospitalization. This required anesthesia in the past.  Assessment & Plan:  Sepsis due to Klebsiella and Proteus bacteremia - Likely biliary source without significant LFT elevation from baseline. Lactate has normalized and vital signs are stable. UCx negative. CXR clear. Port site appears normal. Drain site uninfected and culture grew C. perfringens - Started vanc/zosyn 4/21 > ceftriaxone 4/22. Dr. Auburn Bilberry Discussed with ID, Dr. Johnnye Sima, based on susceptibilities, he recommends 7 days of IV therapy (continue ceftriaxone, last daily dose will be 4/27) followed by 7 days of oral therapy (e.g. keflex).   Cancer of head of pancreas, metastasized to liver and duodenum - Patient has been followed up by Dr. [name]. Patient is on chemotherapy - pt is post biliary drainage  catheter placement on 11/25/2015 with subsequent placement of an internal stent on 12/13/2015. - patient's biliary stent occluded and pt underwent repeat acute biliary drainage catheter placement on 10/16/2016.  - subsequently underwent endoscopic placement of a internal biliary stent on 10/26/2016 - the endoscopic stent was placed alongside the existing biliary drain. - underwent fluoroscopic guided biliary drainage catheter exchange by  Dr. Pascal Lux 4/25 - plan is for the patient to return for routine fluoroscopic guided percutaneous biliary drainage catheter exchange in approximately 1 month with repeat CMP level at that time. At that time, the biliary drainage catheter to be exchange for a regular all-purpose drainage catheter which will be coiled and locked cranial to biliary stent. The all-purpose drainage catheter will be capped for a trial of internalization. Pt will then return to the interventional radiology department 1 week following this catheter conversion with a repeat CMP level and repeat cholangiogram. If the he tolerates this trial of percutaneous biliary drainage capping, the drainage catheter may be removed at that time. If the patient experiences another episode of sepsis, he will likely have to continue with routine fluoroscopic guided percutaneous biliary drainage catheter exchanges.  Fever overnight - monitor for now  Volume overload, low abd fullness - bladder scan with > 600 cc fluid - lasix given to assist with diuresis with volume overload, foley in place - plan to remove foley in AM - weight trending in the past 48 hours  Filed Weights   02/12/17 0520 02/13/17 0549 02/14/17 0322  Weight: 69.9 kg (154 lb) 77.6 kg (171 lb 1.6 oz) 78.6 kg (173 lb 4.5 oz)   Hypokalemia - supplemented and WNL this AM - BMP in AM  Anemia - Normocytic, acute on chronic - gave 1u PRBCs with  increase to 9.1mg /dl but Hg now down to 8.5 - no evidence of active bleeding  - Monitor CBC in  AM - 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  T2DM - Last A1c 7.0 on 09/19/15, well controlled. Patient is Rembrandt decreased 44u to 40u 4/22, hypoglycemic episode 4/23, decreased to 30u qHS. - Carb-modified diet.  Moderate malnutrition in setting of chronic illness: - Nutrition consulted, continue home supplements. - so far improved appetite and tolerating diet well   Pulmonary nodule RUL: Noted on CXR at admission, suspected granuloma.  - Repeat imaging recommended in 3 - 6 months. Defer to Dr. [name].   DVT prophylaxis: Lovenox Code Status: DNR Family Communication: Wife at bedside this AM Disposition Plan: DC to home 4/27 if no fevers overnight   Consultants:   Dr. [name], oncology  Dr. Pascal Lux, IR  Procedures:   1u PRBCs 4/22  Antimicrobials:  Vanc/zosyn 4/21 > 4/22  Ceftriaxone 4/22 >>    Subjective: Pt ambulating in halls. Had fever last night but denies any chest pain, no abd or urinary concerns.   Objective: Vitals:   02/13/17 2122 02/13/17 2306 02/14/17 0322 02/14/17 0614  BP: 135/65   (!) 149/76  Pulse: 87   73  Resp: 20   18  Temp: (!) 101 F (38.3 C) 99 F (37.2 C)  97.7 F (36.5 C)  TempSrc: Oral Oral  Oral  SpO2: 96%   99%  Weight:   78.6 kg (173 lb 4.5 oz)   Height:        Intake/Output Summary (Last 24 hours) at 02/14/17 1126 Last data filed at 02/14/17 1000  Gross per 24 hour  Intake             1190 ml  Output             4975 ml  Net            -3785 ml   Filed Weights   02/12/17 0520 02/13/17 0549 02/14/17 0322  Weight: 69.9 kg (154 lb) 77.6 kg (171 lb 1.6 oz) 78.6 kg (173 lb 4.5 oz)    Examination: General exam: Thin 71 y.o. male walking in halls in no distress Respiratory system: Non-labored breathing room air. Clear to auscultation bilaterally.  Cardiovascular system: Regular rate and rhythm. No murmur, rub, or gallop. No JVD, and no pedal  edema. Gastrointestinal system: Abdomen soft, non-tender, mildly distended, with normoactive bowel sounds. No organomegaly or masses felt. Central nervous system: Alert and oriented. No focal neurological deficits. Extremities: Warm, no deformities Skin: Port in right upper chest c/d/i without erythema. RUQ biliary drain site without erythema, drainage/induration or tenderness.  Psychiatry: Judgement and insight appear normal. Mood & affect appropriate.   Data Reviewed: I have personally reviewed following labs and imaging studies  CBC:  Recent Labs Lab 02/09/17 0751 02/10/17 0511 02/12/17 0606 02/13/17 0539 02/14/17 0547  WBC 3.9* 6.9 4.8 4.6 6.9  NEUTROABS 3.6  --   --   --   --   HGB 8.3* 7.6* 9.1* 8.5* 8.9*  HCT 24.4* 22.6* 27.5* 26.1* 27.3*  MCV 88.4 89.0 85.7 85.6 85.3  PLT 175 168 184 184 025   Basic Metabolic Panel:  Recent Labs Lab 02/09/17 0751 02/10/17 0511 02/12/17 0606 02/13/17 0539 02/14/17 0547  NA 133* 133* 134* 135 135  K 3.4* 4.3 3.5 3.3* 3.7  CL 101 106 106 103 101  CO2 22 22 22 23 28   GLUCOSE  77 246* 236* 150* 181*  BUN 18 16 10 11 10   CREATININE 1.05 0.79 0.73 0.67 0.72  CALCIUM 8.1* 7.8* 7.9* 8.0* 8.3*   Liver Function Tests:  Recent Labs Lab 02/09/17 0751 02/10/17 0511 02/12/17 0606 02/13/17 0539  AST 102* 61* 58* 70*  ALT 82* 64* 60 62  ALKPHOS 833* 569* 724* 825*  BILITOT 2.2* 1.3* 1.1 0.7  PROT 5.7* 5.1* 5.5* 5.2*  ALBUMIN 2.4* 2.0* 2.1* 2.0*   Coagulation Profile:  Recent Labs Lab 02/09/17 0751  INR 1.01   CBG:  Recent Labs Lab 02/13/17 1715 02/13/17 2120 02/13/17 2356 02/14/17 0337 02/14/17 0745  GLUCAP 231* 104* 110* 105* 144*   Urine analysis:    Component Value Date/Time   COLORURINE YELLOW 02/09/2017 [place] 02/09/2017 0752   LABSPEC 1.010 02/09/2017 0752   PHURINE 5.0 02/09/2017 0752   GLUCOSEU NEGATIVE 02/09/2017 0752   HGBUR MODERATE (A) 02/09/2017 0752   BILIRUBINUR NEGATIVE  02/09/2017 0752   KETONESUR NEGATIVE 02/09/2017 0752   PROTEINUR NEGATIVE 02/09/2017 0752   NITRITE NEGATIVE 02/09/2017 0752   LEUKOCYTESUR NEGATIVE 02/09/2017 0752   Recent Results (from the past 240 hour(s))  Culture, blood (Routine x 2)     Status: Abnormal   Collection Time: 02/09/17  7:30 AM  Result Value Ref Range Status   Specimen Description BLOOD BLOOD RIGHT FOREARM  Final   Special Requests   Final    BOTTLES DRAWN AEROBIC AND ANAEROBIC Blood Culture adequate volume   Culture  Setup Time   Final    GRAM NEGATIVE RODS ANAEROBIC BOTTLE ONLY CRITICAL RESULT CALLED TO, READ BACK BY AND VERIFIED WITH: N GLOGOVAC,PHARMD AT 7829 02/10/17 BY L BENFIELD Performed at [facility], [place] [address], [place], [place] [address]    Culture KLEBSIELLA PNEUMONIAE (A)  Final   Report Status 02/12/2017 FINAL  Final   Organism ID, Bacteria KLEBSIELLA PNEUMONIAE  Final      Susceptibility   Klebsiella pneumoniae - MIC*    AMPICILLIN >=32 RESISTANT Resistant     CEFAZOLIN <=4 SENSITIVE Sensitive     CEFEPIME <=1 SENSITIVE Sensitive     CEFTAZIDIME <=1 SENSITIVE Sensitive     CEFTRIAXONE <=1 SENSITIVE Sensitive     CIPROFLOXACIN <=0.25 SENSITIVE Sensitive     GENTAMICIN <=1 SENSITIVE Sensitive     IMIPENEM <=0.25 SENSITIVE Sensitive     TRIMETH/SULFA <=20 SENSITIVE Sensitive     AMPICILLIN/SULBACTAM 4 SENSITIVE Sensitive     PIP/TAZO <=4 SENSITIVE Sensitive     Extended ESBL NEGATIVE Sensitive     * KLEBSIELLA PNEUMONIAE  Blood Culture ID Panel (Reflexed)     Status: Abnormal   Collection Time: 02/09/17  7:30 AM  Result Value Ref Range Status   Enterococcus species NOT DETECTED NOT DETECTED Final   Listeria monocytogenes NOT DETECTED NOT DETECTED Final   Staphylococcus species NOT DETECTED NOT DETECTED Final   Staphylococcus aureus NOT DETECTED NOT DETECTED Final   Streptococcus species NOT DETECTED NOT DETECTED Final   Streptococcus agalactiae NOT DETECTED NOT DETECTED Final    Streptococcus pneumoniae NOT DETECTED NOT DETECTED Final   Streptococcus pyogenes NOT DETECTED NOT DETECTED Final   Acinetobacter baumannii NOT DETECTED NOT DETECTED Final   Enterobacteriaceae species DETECTED (A) NOT DETECTED Final    Comment: Enterobacteriaceae represent a large family of gram-negative bacteria, not a single organism. CRITICAL RESULT CALLED TO, READ BACK BY AND VERIFIED WITH: N GLOGOVAC,PHARMD AT 0931 02/10/17 BY L BENFIELD  Enterobacter cloacae complex NOT DETECTED NOT DETECTED Final   Escherichia coli NOT DETECTED NOT DETECTED Final   Klebsiella oxytoca NOT DETECTED NOT DETECTED Final   Klebsiella pneumoniae DETECTED (A) NOT DETECTED Final    Comment: CRITICAL RESULT CALLED TO, READ BACK BY AND VERIFIED WITH: N GLOGOVAC,PHARMD AT 2694 02/10/17 BY L BENFIELD    Proteus species NOT DETECTED NOT DETECTED Final   Serratia marcescens NOT DETECTED NOT DETECTED Final   Carbapenem resistance NOT DETECTED NOT DETECTED Final   Haemophilus influenzae NOT DETECTED NOT DETECTED Final   Neisseria meningitidis NOT DETECTED NOT DETECTED Final   Pseudomonas aeruginosa NOT DETECTED NOT DETECTED Final   Candida albicans NOT DETECTED NOT DETECTED Final   Candida glabrata NOT DETECTED NOT DETECTED Final   Candida krusei NOT DETECTED NOT DETECTED Final   Candida parapsilosis NOT DETECTED NOT DETECTED Final   Candida tropicalis NOT DETECTED NOT DETECTED Final    Comment: Performed at [facility], [place] [address] [place], [place] [address]  Culture, blood (Routine x 2)     Status: Abnormal   Collection Time: 02/09/17  7:40 AM  Result Value Ref Range Status   Specimen Description BLOOD LEFT ANTECUBITAL  Final   Special Requests   Final    Blood Culture results may not be optimal due to an excessive volume of blood received in culture bottles   Culture  Setup Time   Final    GRAM NEGATIVE RODS IN BOTH AEROBIC AND ANAEROBIC BOTTLES CRITICAL RESULT CALLED TO, READ BACK BY AND  VERIFIED WITH: N GLOGOVAC,PHARMD AT 7035 02/10/17 BY L BENFIELD    Culture (A)  Final    KLEBSIELLA PNEUMONIAE PROTEUS MIRABILIS CRITICAL RESULT CALLED TO, READ BACK BY AND VERIFIED WITH: N [place] 02/12/17 @ 0900 M VESTAL SUSCEPTIBILITIES PERFORMED ON PREVIOUS CULTURE WITHIN THE LAST 5 DAYS. Performed at [facility], [place] [address] [place], [place] [address]    Report Status 02/12/2017 FINAL  Final   Organism ID, Bacteria PROTEUS MIRABILIS  Final      Susceptibility   Proteus mirabilis - MIC*    AMPICILLIN <=2 SENSITIVE Sensitive     CEFAZOLIN <=4 SENSITIVE Sensitive     CEFEPIME <=1 SENSITIVE Sensitive     CEFTAZIDIME <=1 SENSITIVE Sensitive     CEFTRIAXONE <=1 SENSITIVE Sensitive     CIPROFLOXACIN <=0.25 SENSITIVE Sensitive     GENTAMICIN <=1 SENSITIVE Sensitive     IMIPENEM 4 SENSITIVE Sensitive     TRIMETH/SULFA <=20 SENSITIVE Sensitive     AMPICILLIN/SULBACTAM <=2 SENSITIVE Sensitive     PIP/TAZO <=4 SENSITIVE Sensitive     * PROTEUS MIRABILIS  Urine culture     Status: None   Collection Time: 02/09/17 12:30 PM  Result Value Ref Range Status   Specimen Description URINE, CLEAN CATCH  Final   Special Requests NONE  Final   Culture   Final    NO GROWTH Performed at [facility], [place] [address] [place], [place] [address]    Report Status 02/10/2017 FINAL  Final  Body fluid culture     Status: Abnormal   Collection Time: 02/09/17  3:25 PM  Result Value Ref Range Status   Specimen Description COMMON BILE DUCT  Final   Special Requests Immunocompromised  Final   Gram Stain   Final    ABUNDANT WBC PRESENT, PREDOMINANTLY PMN ABUNDANT GRAM NEGATIVE RODS ABUNDANT GRAM POSITIVE COCCI IN PAIRS IN CHAINS ABUNDANT GRAM POSITIVE RODS Performed at [facility], [place] [address], [place], [place] [address]    Culture (A)  Final    MULTIPLE ORGANISMS PRESENT, NONE PREDOMINANT AEROBICALLY ABUNDANT CLOSTRIDIUM PERFRINGENS    Report Status 02/11/2017 FINAL  Final       Radiology Studies: Ir Exchange Biliary Drain  Result Date: 02/12/2017 INDICATION: History of metastatic pancreatic cancer, post biliary drainage catheter placement on 11/25/2015 with subsequent placement of an internal stent on 12/13/2015. Unfortunately, the patient's biliary stent occluded and as such the patient had undergo repeat for acute biliary drainage catheter placement on 10/16/2016. Patient subsequently underwent endoscopic placement of a internal biliary stent on 10/26/2016 - note, the endoscopic stent was placed alongside the existing biliary drain. Patient recently admitted with concern for sepsis though currently is improved. He presents today for returns today for fluoroscopic guided biliary drainage catheter exchange EXAM: FLUOROSCOPIC GUIDED RIGHT-SIDED PERCUTANEOUS BILIARY DRAINAGE CATHETER COMPARISON:  COMPARISON Ultrasound fluoroscopic guided biliary drainage catheter placement - 11/25/2015; 10/16/2016; fluoroscopic guided biliary stent placement - 12/13/2015; endoscopic placement of a internal biliary stent - 10/26/2016; percutaneous cholangiogram and biliary drainage catheter exchange - 11/07/2016 CONTRAST:  15 mL Isovue -300 administered into the biliary system FLUOROSCOPY TIME:  1 minute (24 mGy) COMPLICATIONS: None immediate. TECHNIQUE: Informed written consent was obtained from the patient after a discussion of the risks, benefits and alternatives to treatment. Questions regarding the procedure were encouraged and answered. A timeout was performed prior to the initiation of the procedure. The external portion of the existing right-sided percutaneous biliary drainage catheter as well as the surrounding skin were prepped and draped in the usual sterile fashion. A sterile drape was applied covering the operative field. Maximum barrier sterile technique with sterile gowns and gloves were used for the procedure. A timeout was performed prior to the initiation of the procedure. A pre  procedural spot fluoroscopic image was obtained after contrast was injected via the existing biliary drainage catheter. The existing biliary drainage catheter was cut and cannulated with a stiff Glidewire wire which was coiled within the proximal small bowel. Under intermittent fluoroscopic guidance, the existing PBD was exchanged for a new [place] PBD. Small amount of contrast was injected via the exchanged biliary drainage catheter and a post exchange spot fluoroscopic image was obtained. The catheter was locked and secured to the skin with a single interrupted suture. The biliary drainage catheter was capped. A dressing was placed. The patient tolerated the procedure well without immediate postprocedural complication. FINDINGS: Preprocedural spot fluoroscopic image was obtained of the right upper abdominal quadrant existing percutaneous biliary drainage catheter Images demonstrate grossly unchanged positioning of percutaneous biliary drainage catheter coursing along the endoscopically placed stent within the CBD. An additional stent is seen within descending portion of the duodenum. Contrast injection demonstrates decompression of the biliary system with passage of contrast preferentially through the endoscopically placed biliary stent. There is persistent mild narrowing involving the biliary hilum, cranial to the endoscopically placed stent. After successful fluoroscopic guided exchange, the new 12 French percutaneous biliary catheter is appropriately positioned with end coiled and locked within the proximal duodenum. IMPRESSION: Successful fluoroscopic guided exchange of right sided 12 French percutaneous biliary drainage catheter. PLAN: - Above discussed with referring gastroenterologist, Dr. [name]. - The patient will return for routine fluoroscopic guided percutaneous biliary drainage catheter exchange in approximately 1 month with repeat CMP level at that time. - At that time, the biliary drainage  catheter will be exchange for a regular all-purpose drainage catheter which will be coiled and  locked cranial to biliary stent. The all-purpose drainage catheter will be capped for a trial of internalization. - The patient will return to the interventional radiology department 1 week following this catheter conversion with a repeat CMP level and repeat cholangiogram. If the patient tolerates this trial of percutaneous biliary drainage capping, the drainage catheter may at that time. - If the patient experiences another episode of sepsis, he will likely have to continue with routine fluoroscopic guided percutaneous biliary drainage catheter exchanges. Electronically Signed   By: Sandi Mariscal M.D.   On: 02/12/2017 17:15    Scheduled Meds: . enoxaparin (LOVENOX) injection  40 mg Subcutaneous Q24H  . insulin aspart  0-15 Units Subcutaneous TID WC  . insulin aspart  0-5 Units Subcutaneous QHS  . insulin detemir  30 Units Subcutaneous Daily  . lipase/protease/amylase  36,000 Units Oral TID AC  . loratadine  10 mg Oral Daily  . multivitamin with minerals  1 tablet Oral Daily  . pantoprazole  40 mg Oral Daily   Continuous Infusions: . cefTRIAXone (ROCEPHIN)  IV Stopped (02/13/17 1330)    LOS: 5 days   Time spent: 25 minutes.  Faye Ramsay, MD [facility] Pager [phone]  If 7PM-7AM, please contact night-coverage [email or web address] Password [facility] 02/14/2017, 11:26 AM

## 2017-02-14 NOTE — Progress Notes (Signed)
Referring Physician(s): B. Sherrill  Supervising Physician: Arne Cleveland  Patient Status:  [facility] - In-pt  Chief Complaint:  Metastatic pancreatic cancer, biliary obstruction, bacteremia  Subjective:  [name] is doing well today. He is sitting up in bed and feels pretty good. Had some low grade fever overnight, now afebrile this am.   Allergies: Codeine  Medications:  Current Facility-Administered Medications:  .  acetaminophen (TYLENOL) tablet 1,000 mg, 1,000 mg, Oral, Q6H PRN, Debbe Odea, MD, 1,000 mg at 02/14/17 0317 .  cefTRIAXone (ROCEPHIN) 2 g in dextrose 5 % 50 mL IVPB, 2 g, Intravenous, Q24H, Thomes Lolling, RPH, Stopped at 02/13/17 1330 .  enoxaparin (LOVENOX) injection 40 mg, 40 mg, Subcutaneous, Q24H, Saima Rizwan, MD .  hydrOXYzine (ATARAX/VISTARIL) tablet 25 mg, 25 mg, Oral, TID PRN, Patrecia Pour, MD, 25 mg at 02/10/17 2207 .  insulin aspart (novoLOG) injection 0-15 Units, 0-15 Units, Subcutaneous, TID WC, Patrecia Pour, MD, 2 Units at 02/14/17 [phone] .  insulin aspart (novoLOG) injection 0-5 Units, 0-5 Units, Subcutaneous, QHS, Patrecia Pour, MD .  insulin detemir (LEVEMIR) injection 30 Units, 30 Units, Subcutaneous, Daily, Patrecia Pour, MD, 30 Units at 02/14/17 [phone] .  lipase/protease/amylase (CREON) capsule 36,000 Units, 36,000 Units, Oral, TID AC, Debbe Odea, MD, 36,000 Units at 02/14/17 0834 .  loperamide (IMODIUM) capsule 2 mg, 2 mg, Oral, PRN, Patrecia Pour, MD .  loratadine (CLARITIN) tablet 10 mg, 10 mg, Oral, Daily, Debbe Odea, MD, 10 mg at 02/14/17 0934 .  multivitamin with minerals tablet 1 tablet, 1 tablet, Oral, Daily, Debbe Odea, MD, 1 tablet at 02/14/17 0934 .  ondansetron (ZOFRAN) tablet 4 mg, 4 mg, Oral, Q6H PRN **OR** ondansetron (ZOFRAN) injection 4 mg, 4 mg, Intravenous, Q6H PRN, Debbe Odea, MD, 4 mg at 02/12/17 1609 .  oxyCODONE (Oxy IR/ROXICODONE) immediate release tablet 5 mg, 5 mg, Oral, Q4H PRN, Karsten Fells Kirby-Graham, NP .   pantoprazole (PROTONIX) EC tablet 40 mg, 40 mg, Oral, Daily, Debbe Odea, MD, 40 mg at 02/14/17 0934 .  polyethylene glycol (MIRALAX / GLYCOLAX) packet 17 g, 17 g, Oral, Daily PRN, Debbe Odea, MD, 17 g at 02/13/17 1405 .  polyvinyl alcohol (LIQUIFILM TEARS) 1.4 % ophthalmic solution 1 drop, 1 drop, Both Eyes, PRN, Patrecia Pour, MD .  prochlorperazine (COMPAZINE) tablet 10 mg, 10 mg, Oral, Q6H PRN, Patrecia Pour, MD .  sodium chloride (OCEAN) 0.65 % nasal spray 1 spray, 1 spray, Each Nare, PRN, Patrecia Pour, MD .  traMADol Veatrice Bourbon) tablet 50 mg, 50 mg, Oral, Q6H PRN, Gardiner Barefoot, NP .  zolpidem (AMBIEN) tablet 5 mg, 5 mg, Oral, QHS PRN, Gardiner Barefoot, NP, 5 mg at 02/12/17 0043  Facility-Administered Medications Ordered in Other Encounters:  .  sodium chloride flush (NS) 0.9 % injection 10 mL, 10 mL, Intravenous, PRN, Owens Shark, NP, 10 mL at 01/04/16 0835 .  sodium chloride flush (NS) 0.9 % injection 10 mL, 10 mL, Intravenous, PRN, Ladell Pier, MD, 10 mL at 03/14/16 0938    Vital Signs: BP (!) 149/76 (BP Location: Right Arm)   Pulse 73   Temp 97.7 F (36.5 C) (Oral)   Resp 18   Ht 6' (1.829 m)   Wt 173 lb 4.5 oz (78.6 kg)   SpO2 99%   BMI 23.50 kg/m   Physical Exam Awake and alert Abdomen soft, NT Drain in place and capped. No drainage from skin site  Imaging: Ir  Exchange Biliary Drain  Result Date: 02/12/2017 INDICATION: History of metastatic pancreatic cancer, post biliary drainage catheter placement on 11/25/2015 with subsequent placement of an internal stent on 12/13/2015. Unfortunately, the patient's biliary stent occluded and as such the patient had undergo repeat for acute biliary drainage catheter placement on 10/16/2016. Patient subsequently underwent endoscopic placement of a internal biliary stent on 10/26/2016 - note, the endoscopic stent was placed alongside the existing biliary drain. Patient recently admitted with concern for sepsis though  currently is improved. He presents today for returns today for fluoroscopic guided biliary drainage catheter exchange EXAM: FLUOROSCOPIC GUIDED RIGHT-SIDED PERCUTANEOUS BILIARY DRAINAGE CATHETER COMPARISON:  COMPARISON Ultrasound fluoroscopic guided biliary drainage catheter placement - 11/25/2015; 10/16/2016; fluoroscopic guided biliary stent placement - 12/13/2015; endoscopic placement of a internal biliary stent - 10/26/2016; percutaneous cholangiogram and biliary drainage catheter exchange - 11/07/2016 CONTRAST:  15 mL Isovue -300 administered into the biliary system FLUOROSCOPY TIME:  1 minute (24 mGy) COMPLICATIONS: None immediate. TECHNIQUE: Informed written consent was obtained from the patient after a discussion of the risks, benefits and alternatives to treatment. Questions regarding the procedure were encouraged and answered. A timeout was performed prior to the initiation of the procedure. The external portion of the existing right-sided percutaneous biliary drainage catheter as well as the surrounding skin were prepped and draped in the usual sterile fashion. A sterile drape was applied covering the operative field. Maximum barrier sterile technique with sterile gowns and gloves were used for the procedure. A timeout was performed prior to the initiation of the procedure. A pre procedural spot fluoroscopic image was obtained after contrast was injected via the existing biliary drainage catheter. The existing biliary drainage catheter was cut and cannulated with a stiff Glidewire wire which was coiled within the proximal small bowel. Under intermittent fluoroscopic guidance, the existing PBD was exchanged for a new [place] PBD. Small amount of contrast was injected via the exchanged biliary drainage catheter and a post exchange spot fluoroscopic image was obtained. The catheter was locked and secured to the skin with a single interrupted suture. The biliary drainage catheter was capped. A dressing  was placed. The patient tolerated the procedure well without immediate postprocedural complication. FINDINGS: Preprocedural spot fluoroscopic image was obtained of the right upper abdominal quadrant existing percutaneous biliary drainage catheter Images demonstrate grossly unchanged positioning of percutaneous biliary drainage catheter coursing along the endoscopically placed stent within the CBD. An additional stent is seen within descending portion of the duodenum. Contrast injection demonstrates decompression of the biliary system with passage of contrast preferentially through the endoscopically placed biliary stent. There is persistent mild narrowing involving the biliary hilum, cranial to the endoscopically placed stent. After successful fluoroscopic guided exchange, the new 12 French percutaneous biliary catheter is appropriately positioned with end coiled and locked within the proximal duodenum. IMPRESSION: Successful fluoroscopic guided exchange of right sided 12 French percutaneous biliary drainage catheter. PLAN: - Above discussed with referring gastroenterologist, Dr. [name]. - The patient will return for routine fluoroscopic guided percutaneous biliary drainage catheter exchange in approximately 1 month with repeat CMP level at that time. - At that time, the biliary drainage catheter will be exchange for a regular all-purpose drainage catheter which will be coiled and locked cranial to biliary stent. The all-purpose drainage catheter will be capped for a trial of internalization. - The patient will return to the interventional radiology department 1 week following this catheter conversion with a repeat CMP level and repeat cholangiogram. If the patient tolerates this trial  of percutaneous biliary drainage capping, the drainage catheter may at that time. - If the patient experiences another episode of sepsis, he will likely have to continue with routine fluoroscopic guided percutaneous biliary drainage  catheter exchanges. Electronically Signed   By: Sandi Mariscal M.D.   On: 02/12/2017 17:15    Labs:  CBC:  Recent Labs  02/10/17 0511 02/12/17 0606 02/13/17 0539 02/14/17 0547  WBC 6.9 4.8 4.6 6.9  HGB 7.6* 9.1* 8.5* 8.9*  HCT 22.6* 27.5* 26.1* 27.3*  PLT 168 184 184 229    COAGS:  Recent Labs  10/12/16 2021 11/07/16 0945 02/09/17 0751  INR 0.98 1.01 1.01  APTT 30  --   --     BMP:  Recent Labs  02/10/17 0511 02/12/17 0606 02/13/17 0539 02/14/17 0547  NA 133* 134* 135 135  K 4.3 3.5 3.3* 3.7  CL 106 106 103 101  CO2 22 22 23 28   GLUCOSE 246* 236* 150* 181*  BUN 16 10 11 10   CALCIUM 7.8* 7.9* 8.0* 8.3*  CREATININE 0.79 0.73 0.67 0.72  GFRNONAA >60 >60 >60 >60  GFRAA >60 >60 >60 >60    LIVER FUNCTION TESTS:  Recent Labs  02/09/17 0751 02/10/17 0511 02/12/17 0606 02/13/17 0539  BILITOT 2.2* 1.3* 1.1 0.7  AST 102* 61* 58* 70*  ALT 82* 64* 60 62  ALKPHOS 833* 569* 724* 825*  PROT 5.7* 5.1* 5.5* 5.2*  ALBUMIN 2.4* 2.0* 2.1* 2.0*    Assessment and Plan:  History of metastatic pancreatic cancer, post biliary drainage catheter placement on 11/25/2015 with subsequent placement of an internal stent on 12/13/2015.  Unfortunately, the patient's biliary stent occluded and as such the patient had undergo repeat for acute biliary drainage catheter placement on 10/16/2016.   He subsequently underwent endoscopic placement of a internal biliary stent on 10/26/2016 - the endoscopic stent was placed alongside the existing biliary drain.  He was admitted on 02/09/2017 with concern for sepsis.  He underwent fluoroscopic guided biliary drainage catheter exchange by  Dr. Pascal Lux yesterday.  The plan is for the patient to return for routine fluoroscopic guided percutaneous biliary drainage catheter exchange in approximately 1 month with repeat CMP level at that time.  At that time, the biliary drainage catheter will be exchange for a regular all-purpose drainage  catheter which will be coiled and locked cranial to biliary stent. The all-purpose drainage catheter will be capped for a trial of internalization.  He will then return to the interventional radiology department 1 week following this catheter conversion with a repeat CMP level and repeat cholangiogram.  If the he tolerates this trial of percutaneous biliary drainage capping, the drainage catheter may be removed at that time.  If the patient experiences another episode of sepsis, he will likely have to continue with routine fluoroscopic guided percutaneous biliary drainage catheter exchanges.   Electronically Signed: Ascencion Dike 02/14/2017, 9:45 AM   I spent a total of 15 Minutes at the the patient's bedside AND on the patient's hospital floor or unit, greater than 50% of which was counseling/coordinating care for care for biliary drain.

## 2017-02-14 NOTE — Progress Notes (Signed)
Nursing Note: Pt had temp 101.A; Paged on-call and tylenol give.Pt c/o pain to r drain site.Obtained order for ultram prn.wbb

## 2017-02-14 NOTE — Progress Notes (Signed)
Temp 99.0.wbb

## 2017-02-14 NOTE — Significant Event (Signed)
CRITICAL VALUE ALERT  Critical value received:  positive c-diff  Date of notification:  02/14/2017  Time of notification:  2035  Critical value read back:yes  Nurse who received alert:  Dorothyann Peng Inita Uram,rn  MD notified (1st page):   Baltazar Najjar, NP  Time of first page:  2037  MD notified (2nd page):  Time of second page:  Responding MD:  Samuella Bruin  Time MD responded: 2041

## 2017-02-14 NOTE — Progress Notes (Signed)
Nursing Note: received critical value c-diff. from [facility], in the lab per policy.wbb

## 2017-02-15 ENCOUNTER — Telehealth: Payer: Self-pay | Admitting: *Deleted

## 2017-02-15 ENCOUNTER — Telehealth: Payer: Self-pay | Admitting: Oncology

## 2017-02-15 LAB — COMPREHENSIVE METABOLIC PANEL
ALBUMIN: 2.1 g/dL — AB (ref 3.5–5.0)
ALK PHOS: 979 U/L — AB (ref 38–126)
ALT: 55 U/L (ref 17–63)
AST: 56 U/L — ABNORMAL HIGH (ref 15–41)
Anion gap: 9 (ref 5–15)
BILIRUBIN TOTAL: 0.9 mg/dL (ref 0.3–1.2)
BUN: 9 mg/dL (ref 6–20)
CALCIUM: 8.5 mg/dL — AB (ref 8.9–10.3)
CO2: 28 mmol/L (ref 22–32)
CREATININE: 0.66 mg/dL (ref 0.61–1.24)
Chloride: 102 mmol/L (ref 101–111)
GFR calc Af Amer: >60 mL/min (ref >60)
GFR calc non Af Amer: >60 mL/min (ref >60)
GLUCOSE: 118 mg/dL — AB (ref 65–99)
Potassium: 3.6 mmol/L (ref 3.5–5.1)
SODIUM: 139 mmol/L (ref 135–145)
Total Protein: 6 g/dL — ABNORMAL LOW (ref 6.5–8.1)

## 2017-02-15 LAB — CBC
HEMATOCRIT: 27.3 % — AB (ref 39.0–52.0)
HEMOGLOBIN: 9.2 g/dL — AB (ref 13.0–17.0)
MCH: 29.1 pg (ref 26.0–34.0)
MCHC: 33.7 g/dL (ref 30.0–36.0)
MCV: 86.4 fL (ref 78.0–100.0)
Platelets: 244 10*3/uL (ref 150–400)
RBC: 3.16 MIL/uL — AB (ref 4.22–5.81)
RDW: 15.8 % — ABNORMAL HIGH (ref 11.5–15.5)
WBC: 7.3 10*3/uL (ref 4.0–10.5)

## 2017-02-15 LAB — GLUCOSE, CAPILLARY
GLUCOSE-CAPILLARY: 316 mg/dL — AB (ref 65–99)
Glucose-Capillary: 108 mg/dL — ABNORMAL HIGH (ref 65–99)
Glucose-Capillary: 154 mg/dL — ABNORMAL HIGH (ref 65–99)
Glucose-Capillary: 199 mg/dL — ABNORMAL HIGH (ref 65–99)
Glucose-Capillary: 292 mg/dL — ABNORMAL HIGH (ref 65–99)

## 2017-02-15 MED ORDER — CEPHALEXIN 500 MG PO CAPS: 500.0000 mg | ORAL_CAPSULE | Freq: Four times a day (QID) | ORAL | 0 refills | Status: DC

## 2017-02-15 MED ORDER — FUROSEMIDE 10 MG/ML IJ SOLN
20.0000 mg | Freq: Once | INTRAMUSCULAR | Status: AC
  Administered 2017-02-15: 20 mg via INTRAVENOUS
  Filled 2017-02-15: qty 2

## 2017-02-15 MED ORDER — CEPHALEXIN 500 MG PO CAPS
500.0000 mg | ORAL_CAPSULE | Freq: Four times a day (QID) | ORAL | Status: DC
  Administered 2017-02-15 – 2017-02-16 (×5): 500 mg via ORAL
  Filled 2017-02-15 (×5): qty 1

## 2017-02-15 MED ORDER — VANCOMYCIN 50 MG/ML ORAL SOLUTION: 125.0000 mg | Freq: Four times a day (QID) | ORAL | 0 refills | Status: DC

## 2017-02-15 MED ORDER — TRAMADOL HCL 50 MG PO TABS: 50.0000 mg | ORAL_TABLET | Freq: Four times a day (QID) | ORAL | 0 refills | Status: DC | PRN

## 2017-02-15 NOTE — Telephone Encounter (Signed)
Late entry for 0900: Called scheduler to schedule high priority schedule message sent on 4/25.

## 2017-02-15 NOTE — Telephone Encounter (Signed)
Per 02/13/17 sch msg, Lab, flush & follow up appointments was scheduled for 02/18/17 with Dr [name]. Message was sent to Alexis/Infusion Mgr regarding Chemo to be added for 02/18/17. Infusion appointment is pending availability.

## 2017-02-15 NOTE — Telephone Encounter (Signed)
Called patient to confirm follow up and Chemo appointments. Left message on patient voicemail with appointment detail.  *Patient was on [place] in the past. Message from [place] nurse, requested FOLFIRI, which was not entered in the patient's Disposition/treatment history. Called Tanya for clarification of Chemo Rx and was told to schedule for FOLFIRI and that the [facility]  Rx was changed.  *Chemo scheduled  for next available date of 02/21/17, per Alexis/Infusion manager.

## 2017-02-15 NOTE — Progress Notes (Signed)
Referring Physician(s): Sherrill,B  Supervising Physician: Jacqulynn Cadet  Patient Status:  [facility] - In-pt  Chief Complaint: Metastatic pancreatic cancer, biliary obstruction, bacteremia    Subjective: Pt doing ok; has had few loose stools;  now c diff positive; denies abd pain,N/V   Allergies: Codeine  Medications: Prior to Admission medications   Medication Sig Start Date End Date Taking? Authorizing Provider  acetaminophen (TYLENOL) 500 MG tablet Take 1,000 mg by mouth every 6 (six) hours as needed.   Yes Historical Provider, MD  baclofen (LIORESAL) 10 MG tablet Take 5 mg by mouth 3 (three) times daily as needed (for hiccups).   Yes Historical Provider, MD  esomeprazole (NEXIUM) 40 MG capsule Take 40 mg by mouth daily.   Yes Historical Provider, MD  hydrOXYzine (ATARAX/VISTARIL) 25 MG tablet Take 1 tablet (25 mg total) by mouth every 8 (eight) hours as needed for itching. 12/06/16  Yes Ladell Pier, MD  insulin aspart (NOVOLOG FLEXPEN) 100 UNIT/ML FlexPen Inject 4 Units into the skin daily before supper.   Yes Historical Provider, MD  Insulin Detemir (LEVEMIR FLEXTOUCH) 100 UNIT/ML Pen Inject 40 Units into the skin daily.   Yes Historical Provider, MD  lidocaine-prilocaine (EMLA) cream Apply 1 application topically as needed (prior to accessing port).   Yes Historical Provider, MD  lipase/protease/amylase (CREON) 36000 UNITS CPEP capsule Take 1 capsule (36,000 Units total) by mouth 3 (three) times daily before meals. 02/07/17  Yes Ladell Pier, MD  loperamide (IMODIUM) 2 MG capsule Take 2 mg by mouth as needed for diarrhea or loose stools.   Yes Historical Provider, MD  loratadine (CLARITIN) 10 MG tablet Take 10 mg by mouth daily.    Yes Historical Provider, MD  Multiple Vitamin (MULTIVITAMIN WITH MINERALS) TABS tablet Take 1 tablet by mouth daily.   Yes Historical Provider, MD  ondansetron (ZOFRAN) 8 MG tablet Take 8 mg by mouth every 6 (six) hours as needed for nausea  or vomiting.    Yes Historical Provider, MD  polyethylene glycol (MIRALAX / GLYCOLAX) packet Take 17 g by mouth daily as needed for mild constipation.    Yes Historical Provider, MD  prochlorperazine (COMPAZINE) 10 MG tablet Take 1 tablet (10 mg total) by mouth every 6 (six) hours as needed for nausea or vomiting. 12/02/15  Yes Ladell Pier, MD  BD PEN NEEDLE NANO U/F 32G X 4 MM MISC USE TO INJECT INSULIN TWICE A DAY 11/06/16   [name], MD  traMADol (ULTRAM) 50 MG tablet Take 1 tablet (50 mg total) by mouth every 6 (six) hours as needed. Patient not taking: Reported on 01/21/2017 12/31/16   Ladell Pier, MD     Vital Signs: BP (!) 152/83 (BP Location: Left Arm)   Pulse 66   Temp 97.5 F (36.4 C) (Oral)   Resp 16   Ht 6' (1.829 m)   Wt 166 lb 3.2 oz (75.4 kg)   SpO2 99%   BMI 22.54 kg/m   Physical Exam awake/alert; RUQ drain intact, insertion site ok, not sig tender, drain capped; irrigated easily with 10 cc sterile NS  Imaging: Ir Exchange Biliary Drain  Result Date: 02/12/2017 INDICATION: History of metastatic pancreatic cancer, post biliary drainage catheter placement on 11/25/2015 with subsequent placement of an internal stent on 12/13/2015. Unfortunately, the patient's biliary stent occluded and as such the patient had undergo repeat for acute biliary drainage catheter placement on 10/16/2016. Patient subsequently underwent endoscopic placement of a internal biliary stent  on 10/26/2016 - note, the endoscopic stent was placed alongside the existing biliary drain. Patient recently admitted with concern for sepsis though currently is improved. He presents today for returns today for fluoroscopic guided biliary drainage catheter exchange EXAM: FLUOROSCOPIC GUIDED RIGHT-SIDED PERCUTANEOUS BILIARY DRAINAGE CATHETER COMPARISON:  COMPARISON Ultrasound fluoroscopic guided biliary drainage catheter placement - 11/25/2015; 10/16/2016; fluoroscopic guided biliary stent placement -  12/13/2015; endoscopic placement of a internal biliary stent - 10/26/2016; percutaneous cholangiogram and biliary drainage catheter exchange - 11/07/2016 CONTRAST:  15 mL Isovue -300 administered into the biliary system FLUOROSCOPY TIME:  1 minute (24 mGy) COMPLICATIONS: None immediate. TECHNIQUE: Informed written consent was obtained from the patient after a discussion of the risks, benefits and alternatives to treatment. Questions regarding the procedure were encouraged and answered. A timeout was performed prior to the initiation of the procedure. The external portion of the existing right-sided percutaneous biliary drainage catheter as well as the surrounding skin were prepped and draped in the usual sterile fashion. A sterile drape was applied covering the operative field. Maximum barrier sterile technique with sterile gowns and gloves were used for the procedure. A timeout was performed prior to the initiation of the procedure. A pre procedural spot fluoroscopic image was obtained after contrast was injected via the existing biliary drainage catheter. The existing biliary drainage catheter was cut and cannulated with a stiff Glidewire wire which was coiled within the proximal small bowel. Under intermittent fluoroscopic guidance, the existing PBD was exchanged for a new [place] PBD. Small amount of contrast was injected via the exchanged biliary drainage catheter and a post exchange spot fluoroscopic image was obtained. The catheter was locked and secured to the skin with a single interrupted suture. The biliary drainage catheter was capped. A dressing was placed. The patient tolerated the procedure well without immediate postprocedural complication. FINDINGS: Preprocedural spot fluoroscopic image was obtained of the right upper abdominal quadrant existing percutaneous biliary drainage catheter Images demonstrate grossly unchanged positioning of percutaneous biliary drainage catheter coursing along the  endoscopically placed stent within the CBD. An additional stent is seen within descending portion of the duodenum. Contrast injection demonstrates decompression of the biliary system with passage of contrast preferentially through the endoscopically placed biliary stent. There is persistent mild narrowing involving the biliary hilum, cranial to the endoscopically placed stent. After successful fluoroscopic guided exchange, the new 12 French percutaneous biliary catheter is appropriately positioned with end coiled and locked within the proximal duodenum. IMPRESSION: Successful fluoroscopic guided exchange of right sided 12 French percutaneous biliary drainage catheter. PLAN: - Above discussed with referring gastroenterologist, Dr. [name]. - The patient will return for routine fluoroscopic guided percutaneous biliary drainage catheter exchange in approximately 1 month with repeat CMP level at that time. - At that time, the biliary drainage catheter will be exchange for a regular all-purpose drainage catheter which will be coiled and locked cranial to biliary stent. The all-purpose drainage catheter will be capped for a trial of internalization. - The patient will return to the interventional radiology department 1 week following this catheter conversion with a repeat CMP level and repeat cholangiogram. If the patient tolerates this trial of percutaneous biliary drainage capping, the drainage catheter may at that time. - If the patient experiences another episode of sepsis, he will likely have to continue with routine fluoroscopic guided percutaneous biliary drainage catheter exchanges. Electronically Signed   By: Sandi Mariscal M.D.   On: 02/12/2017 17:15    Labs:  CBC:  Recent Labs  02/12/17  7078 02/13/17 0539 02/14/17 0547 02/15/17 0625  WBC 4.8 4.6 6.9 7.3  HGB 9.1* 8.5* 8.9* 9.2*  HCT 27.5* 26.1* 27.3* 27.3*  PLT 184 184 229 244    COAGS:  Recent Labs  10/12/16 2021 11/07/16 0945 02/09/17 0751    INR 0.98 1.01 1.01  APTT 30  --   --     BMP:  Recent Labs  02/12/17 0606 02/13/17 0539 02/14/17 0547 02/15/17 0625  NA 134* 135 135 139  K 3.5 3.3* 3.7 3.6  CL 106 103 101 102  CO2 22 23 28 28   GLUCOSE 236* 150* 181* 118*  BUN 10 11 10 9   CALCIUM 7.9* 8.0* 8.3* 8.5*  CREATININE 0.73 0.67 0.72 0.66  GFRNONAA >60 >60 >60 >60  GFRAA >60 >60 >60 >60    LIVER FUNCTION TESTS:  Recent Labs  02/10/17 0511 02/12/17 0606 02/13/17 0539 02/15/17 0625  BILITOT 1.3* 1.1 0.7 0.9  AST 61* 58* 70* 56*  ALT 64* 60 62 55  ALKPHOS 569* 724* 825* 979*  PROT 5.1* 5.5* 5.2* 6.0*  ALBUMIN 2.0* 2.1* 2.0* 2.1*    Assessment and Plan: Patient with history of metastatic pancreatic cancer, biliary obstruction, status post prior internal/external biliary drain and wall flex stenting; AF; WBC nl; t bili nl; creat nl;hgb stable; cdiff + - tx per IM; The plan is for the patient to return for routine fluoroscopic guided percutaneous biliary drainage catheter exchange in approximately 1 month with repeat CMP level at that time. At that time, the biliary drainage catheter will be exchanged for a regular all-purpose drainage catheter which will be coiled and locked cranial to biliary stent. The all-purpose drainage catheter will be capped for a trial of internalization.He will then return to the interventional radiology department 1 week following this catheter conversion with a repeat CMP level and repeat cholangiogram.If the he tolerates this trial of percutaneous biliary drainage capping, the drainage catheter may be removed at that time. If the patient experiences another episode of sepsis, he will likely have to continue with routine fluoroscopic guided percutaneous biliary drainage catheter exchanges. As OP, rec once daily or every other day  irrigation of drain with 5-10 cc sterile NS. Pt should have gravity bag available to take home in case problems arise with capped drain.   Electronically  Signed: D. Rowe Fortunato 02/15/2017, 9:56 AM   I spent a total of 15 minutes at the the patient's bedside AND on the patient's hospital floor or unit, greater than 50% of which was counseling/coordinating care for biliary drain    Patient ID: Jacob Hardin, male   DOB: 1946-01-06, 71 y.o.   MRN: [id number]

## 2017-02-15 NOTE — Discharge Instructions (Signed)
Bacteremia °Bacteremia is the presence of bacteria in the blood. When bacteria enter the bloodstream, they can cause a life-threatening reaction called sepsis, which is a medical emergency. Bacteremia can spread to other parts of the body, including the heart, joints, and brain. °What are the causes? °This condition is caused by bacteria that get into the blood. Bacteria can enter the blood: °· From a skin infection or a cut on your skin. °· During an episode of pneumonia. °· From an infection in your stomach or intestine (gastrointestinal infection). °· From an infection in your bladder or urinary system (urinary tract infection). °· During a dental or medical procedure. °· After you brush your teeth so hard that your gums bleed. °· When a bacterial infection in another part of the body spreads to the blood. °· Through a dirty needle. °What increases the risk? °This condition is more likely to develop in: °· Children. °· Elderly adults. °· People who have a long-lasting (chronic) disease or medical condition. °· People who have an artificial joint or heart valve. °· People who have heart valve disease. °· People who have a tube, such as a catheter or IV tube, that has been inserted for a medical treatment. °· People who have a weak body defense system (immune system). °· People who use IV drugs. °What are the signs or symptoms? °Symptoms of this condition include: °· Fever. °· Chills. °· A racing heart. °· Shortness of breath. °· Dizziness. °· Weakness. °· Confusion. °· Nausea or vomiting. °· Diarrhea. °In some cases, there are no symptoms. Bacteremia that has spread to the other parts of the body may cause symptoms in those areas. °How is this diagnosed? °This condition may be diagnosed with a physical exam and tests, such as: °· A complete blood count (CBC). This test looks for signs of infection. °· Blood cultures. These look for bacteria in your blood. °· Tests of any tubes that you may have inserted into your  body, such as an IV tube or urinary catheter. These tests look for a source of infection. °· Urine tests including urine cultures. These look for bacteria in the urine that could be a source of infection. °· Imaging tests, such as an X-ray, CT scan, MRI, or heart ultrasound. These look for a source of infection in other parts of the body, such as the lungs, heart valves, or joints. °How is this treated? °This condition may be treated with: °· Antibiotic medicines given through an IV infusion. Depending on the source of infection, antibiotics may be needed for several weeks. At first, an antibiotic may be given to kill most types of blood bacteria. If your test results show that a certain kind of bacteria is causing problems, the antibiotic may be changed to kill only the bacteria that are causing problems. °· Antibiotics taken by mouth. °· IV fluids to support the body as you fight the infection. °· Removing any catheter or device that could be a source of infection. °· Blood pressure and breathing support, if you have sepsis. °· Surgery to control the source or spread of infection, such as: °¨ Removing an infected implanted device. °¨ Removing infected tissue or an abscess. °This condition is usually treated at a hospital. If you are treated at home, you may need to come back for medicines, blood tests, and evaluation. This is important. °Follow these instructions at home: °· Take over-the-counter and prescription medicines only as told by your health care provider. °· If you were prescribed   an antibiotic, take it as told by your health care provider. Do not stop taking the antibiotic even if you start to feel better.  Rest until your condition is under control.  Drink enough fluid to keep your urine clear or pale yellow.  Do not smoke. If you need help quitting, ask your health care provider.  Keep all follow-up visits as told by your health care provider. This is important. How is this prevented?  Get  the vaccinations that your health care provider recommends.  Clean and cover any scrapes or cuts.  Take good care of your skin. This includes regular bathing and moisturizing.  Wash your hands often.  Practice good oral hygiene. Brush your teeth two times a day and floss regularly. Get help right away if:  You have pain.  You have a fever.  You have trouble breathing.  Your skin becomes blotchy, pale, or clammy.  You develop confusion, dizziness, or weakness.  You develop diarrhea.  You develop any new symptoms after treatment. Summary  Bacteremia is the presence of bacteria in the blood. When bacteria enter the bloodstream, they can cause a life- threatening reaction called sepsis.  Children and elderly adults are at increased risk of bacteremia. Other risk factors include having a long-lasting (chronic) disease or a weak immune system, having an artificial joint or heart valve, having heart valve disease, having tubes that were inserted in the body for medical treatment, or using IV drugs.  Some symptoms of bacteremia include fever, chills, shortness of breath, confusion, nausea or vomiting, and diarrhea.  Tests may be done to diagnose a source of infection that led to bacteremia. These tests may include blood tests, urine tests, and imaging tests.  Bacteremia is usually treated with antibiotics, usually in a hospital. This information is not intended to replace advice given to you by your health care provider. Make sure you discuss any questions you have with your health care provider. Document Released: 07/22/2006 Document Revised: 09/04/2016 Document Reviewed: 09/04/2016 Elsevier Interactive Patient Education  2017 [employer]   Clostridium Difficile Infection  Clostridium difficile (C. difficile or C. diff) infection causes inflammation of the large intestine (colon). This condition can result in damage to the lining of your colon and may lead to another condition  called colitis. This infection can be passed from person to person (is contagious). Follow these instructions at home: Eating and drinking   Drink enough fluid to keep your pee (urine) clear or pale yellow.  Avoid drinking:  Milk.  Caffeine.  Alcohol.  Follow exact instructions from your doctor about how to get enough fluid in your body (rehydrate).  Eat small meals often instead of large meals. Medicines   Take your antibiotic medicine as told by your doctor. Do not stop taking the antibiotic even if you start to feel better unless your doctor told you to do that.  Take over-the-counter and prescription medicines only as told by your doctor.  Do not use medicines to help with watery poop (diarrhea). General instructions   Wash your hands fully before you prepare food and after you use the bathroom. Make sure people who live with you also wash their  hands often.  Clean the surfaces that you touch. Use a product that contains chlorine bleach.  Keep all follow-up visits as told by your doctor. This is important. Contact a doctor if:  Your symptoms do not get better with treatment.  Your symptoms get worse with treatment.  Your symptoms go away  and then come back.  You have a fever.  You have new symptoms. Get help right away if:  You have more pain or tenderness in your belly (abdomen).  Your poop (stool) is mostly bloody.  Your poop looks dark black and tarry.  You cannot eat or drink without throwing up (vomiting).  You have signs of dehydration, such as:  Dark pee, very little pee, or no pee.  Cracked lips.  Not making tears when you cry.  Dry mouth.  Sunken eyes.  Feeling sleepy.  Feeling weak.  Feeling dizzy. This information is not intended to replace advice given to you by your health care provider. Make sure you discuss any questions you have with your health care provider. Document Released: 08/05/2009 Document Revised: 03/15/2016  Document Reviewed: 04/11/2015 Elsevier Interactive Patient Education  2017 [employer].

## 2017-02-15 NOTE — Progress Notes (Signed)
PROGRESS NOTE  Jacob Hardin  LKT:[id number] DOB: December 30, 1945 DOA: 02/09/2017   PCP: [name], MD   Outpatient Specialists: Dr. [name], GI Dr. 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 He had no other significant symptoms, specifically no abdominal pain, nausea, vomiting, cough, dyspnea, chest pain, urinary complaints, or wounds. Biliary drain has been flushed weekly by his wife with no reported complications. On arrival, he was febrile and hypotensive with clear CXR, started on broad-spectrum antibiotics and given IV fluids which improved blood pressure. Repeat lactate normalized and he was admitted to [facility] for sepsis with uncertain source. Blood cultures have grown Klebsiella in 2 of 2, so antibiotics were narrowed to ceftriaxone pending sensitivity data. IR was consulted for biliary drain evaluation and is planning exchange during hospitalization. This required anesthesia in the past.  Assessment & Plan:  Sepsis due to Klebsiella and Proteus bacteremia - Likely biliary source without significant LFT elevation from baseline. Lactate has normalized and vital signs are stable. UCx negative. CXR clear. Port site appears normal. Drain site uninfected and culture grew C. perfringens - Started vanc/zosyn 4/21 > ceftriaxone 4/22. Dr. Auburn Bilberry Discussed with ID, Dr. Johnnye Sima, based on susceptibilities, he recommends 7 days of IV therapy (continue ceftriaxone, last daily dose 4/27) followed by 7 days of oral therapy (e.g. keflex). - keflex ordered today   Diarrhea - C. Diff positive - stop Protonix - place on oral vancomycin 4/26 (received one dose yesterday)   Cancer of head of pancreas, metastasized to  liver and duodenum - Patient has been followed up by Dr. [name]. Patient is on chemotherapy - pt is post biliary drainage catheter placement on 11/25/2015 with subsequent placement of an internal stent on 12/13/2015. - patient's biliary stent occluded and pt underwent repeat acute biliary drainage catheter placement on 10/16/2016.  - subsequently underwent endoscopic placement of a internal biliary stent on 10/26/2016 - the endoscopic stent was placed alongside the existing biliary drain. - underwent fluoroscopic guided biliary drainage catheter exchange by  Dr. Pascal Lux 4/25 - plan is for the patient to return for routine fluoroscopic guided percutaneous biliary drainage catheter exchange in approximately 1 month with repeat CMP level at that time. At that time, the biliary drainage catheter to be exchange for a regular all-purpose drainage catheter which will be coiled and locked cranial to biliary stent. The all-purpose drainage catheter will be capped for a trial of internalization. Pt will then return to the interventional radiology department 1 week following this catheter conversion with a repeat CMP level and repeat cholangiogram. If the he tolerates this trial of percutaneous biliary drainage capping, the drainage catheter may be removed at that time. If the patient experiences another episode of sepsis, he will likely have to continue with routine fluoroscopic guided percutaneous biliary drainage catheter exchanges.  Fever overnight 4/26 - likely from C. Diff - treating C. 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  T2DM - Last A1c 7.0 on 09/19/15, well controlled. Patient is [place] decreased 44u to 40u 4/22, hypoglycemic episode 4/23, decreased to 30u qHS. - Carb-modified diet.  Moderate malnutrition in setting of chronic illness: - Nutrition consulted, continue home supplements. - so far improved appetite and tolerating diet well   Pulmonary nodule RUL: Noted on CXR at admission, suspected granuloma.  - Repeat imaging recommended in 3 - 6 months. Defer to Dr. [name].   DVT prophylaxis: Lovenox Code Status: DNR Family Communication: Wife at bedside this AM Disposition Plan: DC to home in am if diarrhea improved   Consultants:   Dr. [name], oncology  Dr. Pascal Lux, IR  Procedures:   1u PRBCs 4/22  Antimicrobials:  Vanc/zosyn 4/21 > 4/22  Ceftriaxone 4/22 >>  4/27  Keflex 4/27 >>  Oral vancomycin 4/26 >>  Subjective: Pt ambulating in halls. Had fever last night but denies any chest pain, no abd or urinary concerns.   Objective: Vitals:   02/14/17 1446 02/14/17 1935 02/14/17 2129 02/15/17 0503  BP: (!) 158/77  140/88 (!) 152/83  Pulse: 74  80 66  Resp: 18  16 16   Temp: 98.3 F (36.8 C)  98.5 F (36.9 C) 97.5 F (36.4 C)  TempSrc: Oral  Oral Oral  SpO2: 96%  100% 99%  Weight:  75.4 kg (166 lb 3.2 oz)    Height:        Intake/Output Summary (Last 24 hours) at 02/15/17 1114 Last data filed at 02/15/17 0503  Gross per 24 hour  Intake             1060 ml  Output             3050 ml  Net            -1990 ml   Filed Weights   02/13/17 0549 02/14/17 0322 02/14/17 1935  Weight: 77.6 kg (171 lb 1.6 oz) 78.6 kg (173 lb 4.5 oz) 75.4 kg (166 lb 3.2 oz)    Examination: General exam:  Thin 71 y.o. male walking in halls in no distress Respiratory system: Non-labored breathing room air. Clear to auscultation bilaterally.  Cardiovascular system: Regular rate and rhythm. No murmur, rub, or gallop. No JVD, and no pedal edema. Gastrointestinal system: Abdomen soft, non-tender, mildly distended, with normoactive bowel sounds. No organomegaly or masses felt. Central nervous system: Alert and oriented. No focal neurological deficits. Extremities: Warm, no deformities Skin: Port in right upper chest c/d/i without erythema. RUQ biliary drain site without erythema, drainage/induration or tenderness.  Psychiatry: Judgement and insight appear normal. Mood & affect appropriate.   Data Reviewed: I have personally reviewed following labs and imaging studies  CBC:  Recent Labs Lab 02/09/17 0751 02/10/17 0511 02/12/17 0606 02/13/17 0539 02/14/17 0547 02/15/17 0625  WBC 3.9* 6.9 4.8 4.6 6.9 7.3  NEUTROABS 3.6  --   --   --   --   --   HGB 8.3* 7.6* 9.1* 8.5* 8.9* 9.2*  HCT 24.4* 22.6* 27.5* 26.1* 27.3* 27.3*  MCV 88.4 89.0 85.7 85.6 85.3 86.4  PLT 175 168 184 184 229 735   Basic Metabolic Panel:  Recent Labs Lab 02/10/17 0511 02/12/17 0606 02/13/17 0539 02/14/17 0547 02/15/17 0625  NA 133* 134* 135 135 139  K 4.3 3.5 3.3* 3.7 3.6  CL 106 106 103 101 102  CO2 22 22 23 28 28   GLUCOSE 246* 236* 150* 181* 118*  BUN 16 10 11 10 9   CREATININE 0.79 0.73 0.67 0.72 0.66  CALCIUM 7.8* 7.9* 8.0* 8.3* 8.5*   Liver Function Tests:  Recent Labs Lab 02/09/17 0751 02/10/17 0511 02/12/17 0606 02/13/17 0539 02/15/17 0625  AST 102* 61* 58* 70* 56*  ALT 82* 64* 60 62 55  ALKPHOS 833* 569* 724* 825* 979*  BILITOT 2.2* 1.3* 1.1 0.7 0.9  PROT 5.7* 5.1* 5.5* 5.2* 6.0*  ALBUMIN 2.4* 2.0* 2.1* 2.0* 2.1*   Coagulation Profile:  Recent Labs Lab 02/09/17 0751  INR 1.01   CBG:  Recent Labs Lab 02/14/17 1208 02/14/17 1749 02/14/17 2131 02/15/17 0057 02/15/17 0743    GLUCAP 233* 159* 161* 199* 108*   Urine analysis:    Component Value Date/Time   COLORURINE YELLOW 02/09/2017 0752   APPEARANCEUR CLEAR 02/09/2017 0752   LABSPEC 1.010 02/09/2017 0752   PHURINE 5.0 02/09/2017 0752   GLUCOSEU NEGATIVE 02/09/2017 0752   HGBUR MODERATE (A) 02/09/2017 0752   BILIRUBINUR NEGATIVE 02/09/2017 0752   KETONESUR NEGATIVE 02/09/2017 0752   PROTEINUR NEGATIVE 02/09/2017 0752   NITRITE NEGATIVE 02/09/2017 0752   LEUKOCYTESUR NEGATIVE 02/09/2017 0752   Recent Results (from the past 240 hour(s))  Culture, blood (Routine x 2)     Status: Abnormal   Collection Time: 02/09/17  7:30 AM  Result Value Ref Range Status   Specimen Description BLOOD BLOOD RIGHT FOREARM  Final   Special Requests   Final    BOTTLES DRAWN AEROBIC AND ANAEROBIC Blood Culture adequate volume   Culture  Setup Time   Final    GRAM NEGATIVE RODS ANAEROBIC BOTTLE ONLY CRITICAL RESULT CALLED TO, READ BACK BY AND VERIFIED WITH: N GLOGOVAC,PHARMD AT 3299 02/10/17 BY L BENFIELD Performed at [facility], [place] [address], [place], [place] [address]    Culture KLEBSIELLA PNEUMONIAE (A)  Final   Report Status 02/12/2017 FINAL  Final   Organism ID, Bacteria KLEBSIELLA PNEUMONIAE  Final      Susceptibility   Klebsiella pneumoniae - MIC*    AMPICILLIN >=32 RESISTANT Resistant     CEFAZOLIN <=4 SENSITIVE Sensitive     CEFEPIME <=1 SENSITIVE Sensitive     CEFTAZIDIME <=1 SENSITIVE Sensitive     CEFTRIAXONE <=1 SENSITIVE Sensitive     CIPROFLOXACIN <=0.25 SENSITIVE Sensitive     GENTAMICIN <=1 SENSITIVE Sensitive     IMIPENEM <=0.25 SENSITIVE Sensitive     TRIMETH/SULFA <=20 SENSITIVE Sensitive     AMPICILLIN/SULBACTAM 4 SENSITIVE Sensitive     PIP/TAZO <=4 SENSITIVE Sensitive     Extended ESBL NEGATIVE Sensitive     * KLEBSIELLA PNEUMONIAE  Blood Culture ID Panel (Reflexed)     Status: Abnormal   Collection Time: 02/09/17  7:30 AM  Result Value Ref Range Status   Enterococcus species NOT  DETECTED NOT DETECTED Final   Listeria monocytogenes NOT DETECTED NOT DETECTED Final   Staphylococcus species NOT DETECTED NOT DETECTED Final   Staphylococcus aureus NOT DETECTED NOT DETECTED Final   Streptococcus species NOT DETECTED NOT DETECTED Final   Streptococcus agalactiae NOT DETECTED NOT DETECTED Final   Streptococcus pneumoniae NOT DETECTED NOT  DETECTED Final   Streptococcus pyogenes NOT DETECTED NOT DETECTED Final   Acinetobacter baumannii NOT DETECTED NOT DETECTED Final   Enterobacteriaceae species DETECTED (A) NOT DETECTED Final    Comment: Enterobacteriaceae represent a large family of gram-negative bacteria, not a single organism. CRITICAL RESULT CALLED TO, READ BACK BY AND VERIFIED WITH: N GLOGOVAC,PHARMD AT 0931 02/10/17 BY L BENFIELD    Enterobacter cloacae complex NOT DETECTED NOT DETECTED Final   Escherichia coli NOT DETECTED NOT DETECTED Final   Klebsiella oxytoca NOT DETECTED NOT DETECTED Final   Klebsiella pneumoniae DETECTED (A) NOT DETECTED Final    Comment: CRITICAL RESULT CALLED TO, READ BACK BY AND VERIFIED WITH: N GLOGOVAC,PHARMD AT 0931 02/10/17 BY L BENFIELD    Proteus species NOT DETECTED NOT DETECTED Final   Serratia marcescens NOT DETECTED NOT DETECTED Final   Carbapenem resistance NOT DETECTED NOT DETECTED Final   Haemophilus influenzae NOT DETECTED NOT DETECTED Final   Neisseria meningitidis NOT DETECTED NOT DETECTED Final   Pseudomonas aeruginosa NOT DETECTED NOT DETECTED Final   Candida albicans NOT DETECTED NOT DETECTED Final   Candida glabrata NOT DETECTED NOT DETECTED Final   Candida krusei NOT DETECTED NOT DETECTED Final   Candida parapsilosis NOT DETECTED NOT DETECTED Final   Candida tropicalis NOT DETECTED NOT DETECTED Final    Comment: Performed at [facility], [place]. [address], [place], [place] [address]  Culture, blood (Routine x 2)     Status: Abnormal   Collection Time: 02/09/17  7:40 AM  Result Value Ref Range Status   Specimen  Description BLOOD LEFT ANTECUBITAL  Final   Special Requests   Final    Blood Culture results may not be optimal due to an excessive volume of blood received in culture bottles   Culture  Setup Time   Final    GRAM NEGATIVE RODS IN BOTH AEROBIC AND ANAEROBIC BOTTLES CRITICAL RESULT CALLED TO, READ BACK BY AND VERIFIED WITH: N GLOGOVAC,PHARMD AT 0086 02/10/17 BY L BENFIELD    Culture (A)  Final    KLEBSIELLA PNEUMONIAE PROTEUS MIRABILIS CRITICAL RESULT CALLED TO, READ BACK BY AND VERIFIED WITH: N [place] 02/12/17 @ 0900 M VESTAL SUSCEPTIBILITIES PERFORMED ON PREVIOUS CULTURE WITHIN THE LAST 5 DAYS. Performed at [facility], [place] [address] [place], [place] [address]    Report Status 02/12/2017 FINAL  Final   Organism ID, Bacteria PROTEUS MIRABILIS  Final      Susceptibility   Proteus mirabilis - MIC*    AMPICILLIN <=2 SENSITIVE Sensitive     CEFAZOLIN <=4 SENSITIVE Sensitive     CEFEPIME <=1 SENSITIVE Sensitive     CEFTAZIDIME <=1 SENSITIVE Sensitive     CEFTRIAXONE <=1 SENSITIVE Sensitive     CIPROFLOXACIN <=0.25 SENSITIVE Sensitive     GENTAMICIN <=1 SENSITIVE Sensitive     IMIPENEM 4 SENSITIVE Sensitive     TRIMETH/SULFA <=20 SENSITIVE Sensitive     AMPICILLIN/SULBACTAM <=2 SENSITIVE Sensitive     PIP/TAZO <=4 SENSITIVE Sensitive     * PROTEUS MIRABILIS  Urine culture     Status: None   Collection Time: 02/09/17 12:30 PM  Result Value Ref Range Status   Specimen Description URINE, CLEAN CATCH  Final   Special Requests NONE  Final   Culture   Final    NO GROWTH Performed at [facility], [place] [address] [place], [place] [address]    Report Status 02/10/2017 FINAL  Final  Body fluid culture  Status: Abnormal   Collection Time: 02/09/17  3:25 PM  Result Value Ref Range Status   Specimen Description COMMON BILE DUCT  Final   Special Requests Immunocompromised  Final   Gram Stain   Final    ABUNDANT WBC PRESENT, PREDOMINANTLY PMN ABUNDANT GRAM NEGATIVE  RODS ABUNDANT GRAM POSITIVE COCCI IN PAIRS IN CHAINS ABUNDANT GRAM POSITIVE RODS Performed at [facility], [place] [address], [place],  [address]    Culture (A)  Final    MULTIPLE ORGANISMS PRESENT, NONE PREDOMINANT AEROBICALLY ABUNDANT CLOSTRIDIUM PERFRINGENS    Report Status 02/11/2017 FINAL  Final  C difficile quick scan w PCR reflex     Status: Abnormal   Collection Time: 02/14/17  1:42 PM  Result Value Ref Range Status   C Diff antigen POSITIVE (A) NEGATIVE Final   C Diff toxin POSITIVE (A) NEGATIVE Final   C Diff interpretation Toxin producing C. difficile detected.  Final    Comment: CRITICAL RESULT CALLED TO, READ BACK BY AND VERIFIED WITH: BAKER,B RN 4.26.18 @2035  ZANDO,C       Radiology Studies: No results found.  Scheduled Meds: . cephALEXin  500 mg Oral Q6H  . enoxaparin (LOVENOX) injection  40 mg Subcutaneous Q24H  . furosemide  20 mg Intravenous Once  . insulin aspart  0-15 Units Subcutaneous TID WC  . insulin aspart  0-5 Units Subcutaneous QHS  . insulin detemir  30 Units Subcutaneous Daily  . lipase/protease/amylase  36,000 Units Oral TID AC  . loratadine  10 mg Oral Daily  . multivitamin with minerals  1 tablet Oral Daily  . vancomycin  125 mg Oral QID   Continuous Infusions:   LOS: 6 days   Time spent: 25 minutes.  Faye Ramsay, MD [facility] Pager [phone]  If 7PM-7AM, please contact night-coverage [email or web address] Password [email or web address] 02/15/2017, 11:14 AM

## 2017-02-16 LAB — BASIC METABOLIC PANEL
Anion gap: 5 (ref 5–15)
BUN: 9 mg/dL (ref 6–20)
CALCIUM: 8.2 mg/dL — AB (ref 8.9–10.3)
CO2: 28 mmol/L (ref 22–32)
CREATININE: 0.69 mg/dL (ref 0.61–1.24)
Chloride: 100 mmol/L — ABNORMAL LOW (ref 101–111)
GFR calc Af Amer: >60 mL/min (ref >60)
Glucose, Bld: 208 mg/dL — ABNORMAL HIGH (ref 65–99)
Potassium: 4 mmol/L (ref 3.5–5.1)
SODIUM: 133 mmol/L — AB (ref 135–145)

## 2017-02-16 LAB — CBC
HCT: 27.1 % — ABNORMAL LOW (ref 39.0–52.0)
Hemoglobin: 9.2 g/dL — ABNORMAL LOW (ref 13.0–17.0)
MCH: 29.6 pg (ref 26.0–34.0)
MCHC: 33.9 g/dL (ref 30.0–36.0)
MCV: 87.1 fL (ref 78.0–100.0)
PLATELETS: 272 10*3/uL (ref 150–400)
RBC: 3.11 MIL/uL — AB (ref 4.22–5.81)
RDW: 16.2 % — AB (ref 11.5–15.5)
WBC: 8.8 10*3/uL (ref 4.0–10.5)

## 2017-02-16 LAB — GLUCOSE, CAPILLARY
GLUCOSE-CAPILLARY: 217 mg/dL — AB (ref 65–99)
Glucose-Capillary: 276 mg/dL — ABNORMAL HIGH (ref 65–99)

## 2017-02-16 MED ORDER — HEPARIN SOD (PORK) LOCK FLUSH 100 UNIT/ML IV SOLN
500.0000 [IU] | Freq: Once | INTRAVENOUS | Status: AC
  Administered 2017-02-16: 500 [IU] via INTRAVENOUS
  Filled 2017-02-16: qty 5

## 2017-02-16 NOTE — Discharge Summary (Addendum)
Physician Discharge Summary  Jacob Hardin BDZ:[id number] DOB: October 08, 1946 DOA: 02/09/2017  PCP: [name], MD  Admit date: 02/09/2017 Discharge date: 02/16/2017  Recommendations for Outpatient Follow-up:  1. Pt will need to follow up with PCP in 2-3 weeks post discharge 2. Please obtain BMP to evaluate electrolytes and kidney function 3. Please also check CBC to evaluate Hg and Hct levels 4. Pt to complete therapy with Oral vancomycin  5. Pt also advised to complete therapy with Keflex as outlined below  6. Please note that PPI stopped until C. Diff resolution 7. Imodium also stopped until C. 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 He had no other significant symptoms, specifically no abdominal pain, nausea, vomiting, cough, dyspnea, chest pain, urinary complaints, or wounds. Biliary drain has been flushed weekly by his wife with no reported complications. On arrival, he was febrile and hypotensive with clear CXR, started on broad-spectrum antibiotics and given IV fluids which improved blood pressure. Repeat lactate normalized and he was admitted to [facility] for sepsis with uncertain source. Blood cultures have grown Klebsiella in 2 of 2, so antibiotics were narrowed to ceftriaxone pending sensitivity data. IR was  consulted for biliary drain evaluation and is planning exchange during hospitalization. This required anesthesia in the past.  Assessment & Plan:  Sepsis due to Klebsiella and Proteus bacteremia associated with biliary drain - Likely biliary source without significant LFT elevation from baseline. Lactate has normalized and vital signs are stable. UCx negative. CXR clear. Port site appears normal. Drain site uninfected and culture grew C. perfringens - Started vanc/zosyn 4/21 > ceftriaxone 4/22. Dr. Auburn Bilberry Discussed with ID, Dr. Johnnye Sima, based on susceptibilities, he recommends 7 days of IV therapy (continue ceftriaxone, last daily dose 4/27) followed by 7 days of oral therapy (e.g. keflex). - keflex script provided   Diarrhea - C. 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 [name]. Patient is on chemotherapy - pt is post biliary drainage catheter placement on 11/25/2015 with subsequent placement of an internal stent on 12/13/2015. - patient's biliary stent occluded and pt underwent repeat acute biliary drainage catheter placement on 10/16/2016.  - subsequently underwent endoscopic placement of a internal biliary stent on 10/26/2016 - the endoscopic stent was placed alongside the existing biliary drain. - underwent fluoroscopic guided biliary drainage catheter exchange by Dr. Pascal Lux 4/25 - plan is for the patient to return for routine fluoroscopic guided percutaneous biliary drainage catheter exchange in approximately 1 month with repeat CMP level at that time. At that time, the biliary drainage catheter to be exchange for a regular all-purpose drainage catheter which will be coiled and locked cranial to biliary stent. The all-purpose  drainage catheter will be capped for a trial of internalization. Pt will  then return to the interventional radiology department 1 week following this catheter conversion with a repeat CMP level and repeat cholangiogram. If the he tolerates this trial of percutaneous biliary drainage capping, the drainage catheter may be removed at that time. If the patient experiences another episode of sepsis, he will likely have to continue with routine fluoroscopic guided percutaneous biliary drainage catheter exchanges.  Fever overnight 4/26 - likely from C. Diff - treating C. 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  T2DM - Last A1c 7.0 on 09/19/15, well controlled. Patient is takingLevemirat home - Carb-modified diet.  Moderate malnutrition in setting of chronic illness: - Nutrition consulted, continue home supplements. - so far improved appetite and tolerating diet well   Pulmonary nodule RUL: Noted on CXR at admission, suspected granuloma.  - Repeat imaging recommended in 3 - 6 months. Defer to Dr. [name].   DVT prophylaxis: Lovenox Code Status: DNR Family Communication: Wife at bedside this AM Disposition Plan: DC to home   Consultants:   Dr. [name], oncology  Dr. Pascal Lux, IR  Procedures:   1U PRBCs 4/22  Antimicrobials:  Vanc/zosyn 4/21 > 4/22  Ceftriaxone 4/22 >>  4/27  Keflex 4/27 >>  Oral vancomycin 4/26  >>  Procedures/Studies: Dg Chest 2 View  Result Date: 02/09/2017 CLINICAL DATA:  Pt c/o new onset fever last night, lethargy this week, hx pancreatic cancer, has been having treatment x 1.5years, last chemo x 3 wks ago. EXAM: CHEST  2 VIEW COMPARISON:  Chest x-ray dated 10/12/2016. FINDINGS: Heart size and mediastinal contours are normal. Atherosclerotic changes noted at the aortic arch. Right chest wall Port-A-Cath appears stable in position. Dense nodule within the right upper lobe, presumed granuloma. No pleural effusion or pneumothorax seen. No acute or suspicious osseous finding. Mild degenerative spurring noted within the thoracic spine. IMPRESSION: 1. No active cardiopulmonary disease. No evidence of pneumonia or pulmonary edema. 2. **An incidental finding of potential clinical significance has been found. Dense pulmonary nodule within the right upper lobe, presumed benign granuloma, likely present on previous exams but better seen on today's study. As the density suggests benign granuloma, would merely recommend follow-up chest x-ray in 3-6 months to ensure stability.** 3. Aortic atherosclerosis. Electronically Signed   By: Franki Cabot M.D.   On: 02/09/2017 08:49   Ir Exchange Biliary Drain  Result Date: 02/12/2017 INDICATION: History of metastatic pancreatic cancer, post biliary drainage catheter placement on 11/25/2015 with subsequent placement of an internal stent on 12/13/2015. Unfortunately, the patient's biliary stent occluded and as such the patient had undergo repeat for acute biliary drainage catheter placement on 10/16/2016. Patient subsequently underwent endoscopic placement of a internal biliary stent on 10/26/2016 -  note, the endoscopic stent was placed alongside the existing biliary drain. Patient recently admitted with concern for sepsis though currently is improved. He presents today for returns today for fluoroscopic guided biliary drainage catheter exchange EXAM: FLUOROSCOPIC  GUIDED RIGHT-SIDED PERCUTANEOUS BILIARY DRAINAGE CATHETER COMPARISON:  COMPARISON Ultrasound fluoroscopic guided biliary drainage catheter placement - 11/25/2015; 10/16/2016; fluoroscopic guided biliary stent placement - 12/13/2015; endoscopic placement of a internal biliary stent - 10/26/2016; percutaneous cholangiogram and biliary drainage catheter exchange - 11/07/2016 CONTRAST:  15 mL Isovue -300 administered into the biliary system FLUOROSCOPY TIME:  1 minute (24 mGy) COMPLICATIONS: None immediate. TECHNIQUE: Informed written consent was obtained from the patient after a discussion of the risks, benefits and alternatives to treatment. Questions regarding the procedure were encouraged and answered. A timeout was performed prior to the initiation of the procedure. The external portion of the existing right-sided percutaneous biliary drainage catheter as well as the surrounding skin were prepped and draped in the usual sterile fashion. A sterile drape was applied covering the operative field. Maximum barrier sterile technique with sterile gowns and gloves were used for the procedure. A timeout was performed prior to the initiation of the procedure. A pre procedural spot fluoroscopic image was obtained after contrast was injected via the existing biliary drainage catheter. The existing biliary drainage catheter was cut and cannulated with a stiff Glidewire wire which was coiled within the proximal small bowel. Under intermittent fluoroscopic guidance, the existing PBD was exchanged for a new [place] PBD. Small amount of contrast was injected via the exchanged biliary drainage catheter and a post exchange spot fluoroscopic image was obtained. The catheter was locked and secured to the skin with a single interrupted suture. The biliary drainage catheter was capped. A dressing was placed. The patient tolerated the procedure well without immediate postprocedural complication. FINDINGS: Preprocedural spot  fluoroscopic image was obtained of the right upper abdominal quadrant existing percutaneous biliary drainage catheter Images demonstrate grossly unchanged positioning of percutaneous biliary drainage catheter coursing along the endoscopically placed stent within the CBD. An additional stent is seen within descending portion of the duodenum. Contrast injection demonstrates decompression of the biliary system with passage of contrast preferentially through the endoscopically placed biliary stent. There is persistent mild narrowing involving the biliary hilum, cranial to the endoscopically placed stent. After successful fluoroscopic guided exchange, the new 12 French percutaneous biliary catheter is appropriately positioned with end coiled and locked within the proximal duodenum. IMPRESSION: Successful fluoroscopic guided exchange of right sided 12 French percutaneous biliary drainage catheter. PLAN: - Above discussed with referring gastroenterologist, Dr. [name]. - The patient will return for routine fluoroscopic guided percutaneous biliary drainage catheter exchange in approximately 1 month with repeat CMP level at that time. - At that time, the biliary drainage catheter will be exchange for a regular all-purpose drainage catheter which will be coiled and locked cranial to biliary stent. The all-purpose drainage catheter will be capped for a trial of internalization. - The patient will return to the interventional radiology department 1 week following this catheter conversion with a repeat CMP level and repeat cholangiogram. If the patient tolerates this trial of percutaneous biliary drainage capping, the drainage catheter may at that time. - If the patient experiences another episode of sepsis, he will likely have to continue with routine fluoroscopic guided percutaneous biliary drainage catheter exchanges. Electronically Signed   By: Sandi Mariscal M.D.   On: 02/12/2017 17:15    Discharge Exam: Vitals:   02/15/17  2046 02/16/17 2025  BP: (!) 131/54 (!) 153/80  Pulse: 69 71  Resp: 18 18  Temp: 99 F (37.2 C) 98 F (36.7 C)   Vitals:   02/15/17 1500 02/15/17 2046 02/16/17 0125 02/16/17 0436  BP: 135/86 (!) 131/54  (!) 153/80  Pulse: 77 69  71  Resp: 18 18  18   Temp: 98.5 F (36.9 C) 99 F (37.2 C)  98 F (36.7 C)  TempSrc: Oral Oral  Oral  SpO2: 98% 98%  98%  Weight:   72.6 kg (160 lb)   Height:        General: Pt is alert, follows commands appropriately, not in acute distress Cardiovascular: Regular rate and rhythm, S1/S2 +, no murmurs, no rubs, no gallops Respiratory: Clear to auscultation bilaterally, no wheezing, no crackles, no rhonchi Abdominal: Soft, non tender, non distended, bowel sounds +, no guarding Extremities: no edema, no cyanosis, pulses palpable bilaterally DP and PT Neuro: Grossly nonfocal  Discharge Instructions  Discharge Instructions    Diet - low sodium heart healthy    Complete by:  As directed    Diet - low sodium heart healthy    Complete by:  As directed    Diet - low sodium heart healthy    Complete by:  As directed    Increase activity slowly    Complete by:  As directed    Increase activity slowly    Complete by:  As directed    Increase activity slowly    Complete by:  As directed      Allergies as of 02/16/2017      Reactions   Codeine Nausea Only      Medication List    STOP taking these medications   esomeprazole 40 MG capsule Commonly known as:  NEXIUM   loperamide 2 MG capsule Commonly known as:  IMODIUM     TAKE these medications   acetaminophen 500 MG tablet Commonly known as:  TYLENOL Take 1,000 mg by mouth every 6 (six) hours as needed.   baclofen 10 MG tablet Commonly known as:  LIORESAL Take 5 mg by mouth 3 (three) times daily as needed (for hiccups).   BD PEN NEEDLE NANO U/F 32G X 4 MM Misc Generic drug:  Insulin Pen Needle USE TO INJECT INSULIN TWICE A DAY   cephALEXin 500 MG capsule Commonly known as:   KEFLEX Take 1 capsule (500 mg total) by mouth every 6 (six) hours.   hydrOXYzine 25 MG tablet Commonly known as:  ATARAX/VISTARIL Take 1 tablet (25 mg total) by mouth every 8 (eight) hours as needed for itching.   LEVEMIR FLEXTOUCH 100 UNIT/ML Pen Generic drug:  Insulin Detemir Inject 40 Units into the skin daily.   lidocaine-prilocaine cream Commonly known as:  EMLA Apply 1 application topically as needed (prior to accessing port).   lipase/protease/amylase 36000 UNITS Cpep capsule Commonly known as:  CREON Take 1 capsule (36,000 Units total) by mouth 3 (three) times daily before meals.   loratadine 10 MG tablet Commonly known as:  CLARITIN Take 10 mg by mouth daily.   multivitamin with minerals Tabs tablet Take 1 tablet by mouth daily.   NOVOLOG FLEXPEN 100 UNIT/ML FlexPen Generic drug:  insulin aspart Inject 4 Units into the skin daily before supper.   ondansetron 8 MG tablet Commonly known as:  ZOFRAN Take 8 mg by mouth every 6 (six) hours as needed for nausea or vomiting.   polyethylene glycol packet Commonly known as:  MIRALAX / GLYCOLAX Take 17  g by mouth daily as needed for mild constipation.   prochlorperazine 10 MG tablet Commonly known as:  COMPAZINE Take 1 tablet (10 mg total) by mouth every 6 (six) hours as needed for nausea or vomiting.   traMADol 50 MG tablet Commonly known as:  ULTRAM Take 1 tablet (50 mg total) by mouth every 6 (six) hours as needed.   vancomycin 50 mg/mL oral solution Commonly known as:  VANCOCIN Take 2.5 mLs (125 mg total) by mouth 4 (four) times daily. Continue taking for 14 days      Follow-up Information    PICKARD,WARREN TOM, MD Follow up.   Specialty:  Family Medicine Contact information: [address] [place] [address] [phone]        Faye Ramsay, MD Follow up.   Specialty:  Internal Medicine Why:  Please call me with any questions [phone] Contact information: [address] [place] [address] [phone]            The results of significant diagnostics from this hospitalization (including imaging, microbiology, ancillary and laboratory) are listed below for reference.     Microbiology: Recent Results (from the past 240 hour(s))  Culture, blood (Routine x 2)     Status: Abnormal   Collection Time: 02/09/17  7:30 AM  Result Value Ref Range Status   Specimen Description BLOOD BLOOD RIGHT FOREARM  Final   Special Requests   Final    BOTTLES DRAWN AEROBIC AND ANAEROBIC Blood Culture adequate volume   Culture  Setup Time   Final    GRAM NEGATIVE RODS ANAEROBIC BOTTLE ONLY CRITICAL RESULT CALLED TO, READ BACK BY AND VERIFIED WITH: N GLOGOVAC,PHARMD AT 1937 02/10/17 BY L BENFIELD Performed at [facility], [place] [address] [place], [place] [address]    Culture KLEBSIELLA PNEUMONIAE (A)  Final   Report Status 02/12/2017 FINAL  Final   Organism ID, Bacteria KLEBSIELLA PNEUMONIAE  Final      Susceptibility   Klebsiella pneumoniae - MIC*    AMPICILLIN >=32 RESISTANT Resistant     CEFAZOLIN <=4 SENSITIVE Sensitive     CEFEPIME <=1 SENSITIVE Sensitive     CEFTAZIDIME <=1 SENSITIVE Sensitive     CEFTRIAXONE <=1 SENSITIVE Sensitive     CIPROFLOXACIN <=0.25 SENSITIVE Sensitive     GENTAMICIN <=1 SENSITIVE Sensitive     IMIPENEM <=0.25 SENSITIVE Sensitive     TRIMETH/SULFA <=20 SENSITIVE Sensitive     AMPICILLIN/SULBACTAM 4 SENSITIVE Sensitive     PIP/TAZO <=4 SENSITIVE Sensitive     Extended ESBL NEGATIVE Sensitive     * KLEBSIELLA PNEUMONIAE  Blood Culture ID Panel (Reflexed)     Status: Abnormal   Collection Time: 02/09/17  7:30 AM  Result Value Ref Range Status   Enterococcus species NOT DETECTED NOT DETECTED Final   Listeria monocytogenes NOT DETECTED NOT DETECTED Final   Staphylococcus species NOT DETECTED NOT DETECTED Final   Staphylococcus aureus NOT DETECTED NOT DETECTED Final   Streptococcus species NOT DETECTED NOT  DETECTED Final   Streptococcus agalactiae NOT DETECTED NOT DETECTED Final   Streptococcus pneumoniae NOT DETECTED NOT DETECTED Final   Streptococcus pyogenes NOT DETECTED NOT DETECTED Final   Acinetobacter baumannii NOT DETECTED NOT DETECTED Final   Enterobacteriaceae species DETECTED (A) NOT DETECTED Final    Comment: Enterobacteriaceae represent a large family of gram-negative bacteria, not a single organism. CRITICAL RESULT CALLED TO, READ BACK BY AND VERIFIED WITH: N GLOGOVAC,PHARMD AT 0931 02/10/17 BY  L BENFIELD    Enterobacter cloacae complex NOT DETECTED NOT DETECTED Final   Escherichia coli NOT DETECTED NOT DETECTED Final   Klebsiella oxytoca NOT DETECTED NOT DETECTED Final   Klebsiella pneumoniae DETECTED (A) NOT DETECTED Final    Comment: CRITICAL RESULT CALLED TO, READ BACK BY AND VERIFIED WITH: N GLOGOVAC,PHARMD AT 3009 02/10/17 BY L BENFIELD    Proteus species NOT DETECTED NOT DETECTED Final   Serratia marcescens NOT DETECTED NOT DETECTED Final   Carbapenem resistance NOT DETECTED NOT DETECTED Final   Haemophilus influenzae NOT DETECTED NOT DETECTED Final   Neisseria meningitidis NOT DETECTED NOT DETECTED Final   Pseudomonas aeruginosa NOT DETECTED NOT DETECTED Final   Candida albicans NOT DETECTED NOT DETECTED Final   Candida glabrata NOT DETECTED NOT DETECTED Final   Candida krusei NOT DETECTED NOT DETECTED Final   Candida parapsilosis NOT DETECTED NOT DETECTED Final   Candida tropicalis NOT DETECTED NOT DETECTED Final    Comment: Performed at [facility], [employer] [employer] [address], [place], [place] [address]  Culture, blood (Routine x 2)     Status: Abnormal   Collection Time: 02/09/17  7:40 AM  Result Value Ref Range Status   Specimen Description BLOOD LEFT ANTECUBITAL  Final   Special Requests   Final    Blood Culture results may not be optimal due to an excessive volume of blood received in culture bottles   Culture  Setup Time   Final    GRAM NEGATIVE RODS IN BOTH  AEROBIC AND ANAEROBIC BOTTLES CRITICAL RESULT CALLED TO, READ BACK BY AND VERIFIED WITH: N GLOGOVAC,PHARMD AT 7622 02/10/17 BY L BENFIELD    Culture (A)  Final    KLEBSIELLA PNEUMONIAE PROTEUS MIRABILIS CRITICAL RESULT CALLED TO, READ BACK BY AND VERIFIED WITH: N [place] 02/12/17 @ 0900 M VESTAL SUSCEPTIBILITIES PERFORMED ON PREVIOUS CULTURE WITHIN THE LAST 5 DAYS. Performed at [facility], [place] [address] [place], [place] [address]    Report Status 02/12/2017 FINAL  Final   Organism ID, Bacteria PROTEUS MIRABILIS  Final      Susceptibility   Proteus mirabilis - MIC*    AMPICILLIN <=2 SENSITIVE Sensitive     CEFAZOLIN <=4 SENSITIVE Sensitive     CEFEPIME <=1 SENSITIVE Sensitive     CEFTAZIDIME <=1 SENSITIVE Sensitive     CEFTRIAXONE <=1 SENSITIVE Sensitive     CIPROFLOXACIN <=0.25 SENSITIVE Sensitive     GENTAMICIN <=1 SENSITIVE Sensitive     IMIPENEM 4 SENSITIVE Sensitive     TRIMETH/SULFA <=20 SENSITIVE Sensitive     AMPICILLIN/SULBACTAM <=2 SENSITIVE Sensitive     PIP/TAZO <=4 SENSITIVE Sensitive     * PROTEUS MIRABILIS  Urine culture     Status: None   Collection Time: 02/09/17 12:30 PM  Result Value Ref Range Status   Specimen Description URINE, CLEAN CATCH  Final   Special Requests NONE  Final   Culture   Final    NO GROWTH Performed at [facility], [place] [address], [place], [place] [address]    Report Status 02/10/2017 FINAL  Final  Body fluid culture     Status: Abnormal   Collection Time: 02/09/17  3:25 PM  Result Value Ref Range Status   Specimen Description COMMON BILE DUCT  Final   Special Requests Immunocompromised  Final   Gram Stain   Final    ABUNDANT WBC PRESENT, PREDOMINANTLY PMN ABUNDANT GRAM NEGATIVE RODS ABUNDANT GRAM POSITIVE COCCI IN PAIRS IN CHAINS ABUNDANT GRAM  POSITIVE RODS Performed at [facility], [place] [address], [place], [place] [address]    Culture (A)  Final    MULTIPLE ORGANISMS PRESENT, NONE PREDOMINANT  AEROBICALLY ABUNDANT CLOSTRIDIUM PERFRINGENS    Report Status 02/11/2017 FINAL  Final  C difficile quick scan w PCR reflex     Status: Abnormal   Collection Time: 02/14/17  1:42 PM  Result Value Ref Range Status   C Diff antigen POSITIVE (A) NEGATIVE Final   C Diff toxin POSITIVE (A) NEGATIVE Final   C Diff interpretation Toxin producing C. difficile detected.  Final    Comment: CRITICAL RESULT CALLED TO, READ BACK BY AND VERIFIED WITH: BAKER,B RN 4.26.18 @2035  ZANDO,C      Labs: Basic Metabolic Panel:  Recent Labs Lab 02/12/17 0606 02/13/17 0539 02/14/17 0547 02/15/17 0625 02/16/17 0429  NA 134* 135 135 139 133*  K 3.5 3.3* 3.7 3.6 4.0  CL 106 103 101 102 100*  CO2 22 23 28 28 28   GLUCOSE 236* 150* 181* 118* 208*  BUN 10 11 10 9 9   CREATININE 0.73 0.67 0.72 0.66 0.69  CALCIUM 7.9* 8.0* 8.3* 8.5* 8.2*   Liver Function Tests:  Recent Labs Lab 02/10/17 0511 02/12/17 0606 02/13/17 0539 02/15/17 0625  AST 61* 58* 70* 56*  ALT 64* 60 62 55  ALKPHOS 569* 724* 825* 979*  BILITOT 1.3* 1.1 0.7 0.9  PROT 5.1* 5.5* 5.2* 6.0*  ALBUMIN 2.0* 2.1* 2.0* 2.1*   CBC:  Recent Labs Lab 02/12/17 0606 02/13/17 0539 02/14/17 0547 02/15/17 0625 02/16/17 0429  WBC 4.8 4.6 6.9 7.3 8.8  HGB 9.1* 8.5* 8.9* 9.2* 9.2*  HCT 27.5* 26.1* 27.3* 27.3* 27.1*  MCV 85.7 85.6 85.3 86.4 87.1  PLT 184 184 229 244 272   CBG:  Recent Labs Lab 02/15/17 0743 02/15/17 1135 02/15/17 1702 02/15/17 2006 02/16/17 0731  GLUCAP 108* 316* 292* 154* 217*   SIGNED: Time coordinating discharge: 30 minutes  Faye Ramsay, MD  [facility] 02/16/2017, 11:14 AM Pager [phone]  If 7PM-7AM, please contact night-coverage [email or web address] Password [email or web address]

## 2017-02-16 NOTE — Progress Notes (Signed)
Patient discharged to home with family, discharge instructions reviewed with patient who verbalized understanding. New RX's given to patient. PAC flushed with saline and heparin and de accessed.

## 2017-02-16 NOTE — Progress Notes (Signed)
Referring Physician(s):  Dr. Darrold Junker  Supervising Physician: Markus Daft  Patient Status:  [facility] - In-pt  Chief Complaint:  Metastatic pancreatic cancer, biliary obstruction, bactermia  Subjective: Alert.  Feeling better.  Ready to go home.   Allergies: Codeine  Medications: Prior to Admission medications   Medication Sig Start Date End Date Taking? Authorizing Provider  acetaminophen (TYLENOL) 500 MG tablet Take 1,000 mg by mouth every 6 (six) hours as needed.   Yes Historical Provider, MD  baclofen (LIORESAL) 10 MG tablet Take 5 mg by mouth 3 (three) times daily as needed (for hiccups).   Yes Historical Provider, MD  esomeprazole (NEXIUM) 40 MG capsule Take 40 mg by mouth daily.   Yes Historical Provider, MD  hydrOXYzine (ATARAX/VISTARIL) 25 MG tablet Take 1 tablet (25 mg total) by mouth every 8 (eight) hours as needed for itching. 12/06/16  Yes Ladell Pier, MD  insulin aspart (NOVOLOG FLEXPEN) 100 UNIT/ML FlexPen Inject 4 Units into the skin daily before supper.   Yes Historical Provider, MD  Insulin Detemir (LEVEMIR FLEXTOUCH) 100 UNIT/ML Pen Inject 40 Units into the skin daily.   Yes Historical Provider, MD  lidocaine-prilocaine (EMLA) cream Apply 1 application topically as needed (prior to accessing port).   Yes Historical Provider, MD  lipase/protease/amylase (CREON) 36000 UNITS CPEP capsule Take 1 capsule (36,000 Units total) by mouth 3 (three) times daily before meals. 02/07/17  Yes Ladell Pier, MD  loperamide (IMODIUM) 2 MG capsule Take 2 mg by mouth as needed for diarrhea or loose stools.   Yes Historical Provider, MD  loratadine (CLARITIN) 10 MG tablet Take 10 mg by mouth daily.    Yes Historical Provider, MD  Multiple Vitamin (MULTIVITAMIN WITH MINERALS) TABS tablet Take 1 tablet by mouth daily.   Yes Historical Provider, MD  ondansetron (ZOFRAN) 8 MG tablet Take 8 mg by mouth every 6 (six) hours as needed for nausea or vomiting.    Yes Historical Provider, MD    polyethylene glycol (MIRALAX / GLYCOLAX) packet Take 17 g by mouth daily as needed for mild constipation.    Yes Historical Provider, MD  prochlorperazine (COMPAZINE) 10 MG tablet Take 1 tablet (10 mg total) by mouth every 6 (six) hours as needed for nausea or vomiting. 12/02/15  Yes Ladell Pier, MD  BD PEN NEEDLE NANO U/F 32G X 4 MM MISC USE TO INJECT INSULIN TWICE A DAY 11/06/16   [name], MD  cephALEXin (KEFLEX) 500 MG capsule Take 1 capsule (500 mg total) by mouth every 6 (six) hours. 02/15/17   Theodis Blaze, MD  traMADol (ULTRAM) 50 MG tablet Take 1 tablet (50 mg total) by mouth every 6 (six) hours as needed. 02/15/17   Theodis Blaze, MD  vancomycin (VANCOCIN) 50 mg/mL oral solution Take 2.5 mLs (125 mg total) by mouth 4 (four) times daily. Continue taking for 14 days 02/15/17   Theodis Blaze, MD     Vital Signs: BP (!) 153/80 (BP Location: Left Arm)   Pulse 71   Temp 98 F (36.7 C) (Oral)   Resp 18   Ht 6' (1.829 m)   Wt 160 lb (72.6 kg)   SpO2 98%   BMI 21.70 kg/m   Physical Exam  Abd:  RUQ drain in place, insertion site intact, non-tender, capped.   Imaging: Ir Exchange Biliary Drain  Result Date: 02/12/2017 INDICATION: History of metastatic pancreatic cancer, post biliary drainage catheter placement on 11/25/2015 with subsequent placement  of an internal stent on 12/13/2015. Unfortunately, the patient's biliary stent occluded and as such the patient had undergo repeat for acute biliary drainage catheter placement on 10/16/2016. Patient subsequently underwent endoscopic placement of a internal biliary stent on 10/26/2016 - note, the endoscopic stent was placed alongside the existing biliary drain. Patient recently admitted with concern for sepsis though currently is improved. He presents today for returns today for fluoroscopic guided biliary drainage catheter exchange EXAM: FLUOROSCOPIC GUIDED RIGHT-SIDED PERCUTANEOUS BILIARY DRAINAGE CATHETER COMPARISON:  COMPARISON  Ultrasound fluoroscopic guided biliary drainage catheter placement - 11/25/2015; 10/16/2016; fluoroscopic guided biliary stent placement - 12/13/2015; endoscopic placement of a internal biliary stent - 10/26/2016; percutaneous cholangiogram and biliary drainage catheter exchange - 11/07/2016 CONTRAST:  15 mL Isovue -300 administered into the biliary system FLUOROSCOPY TIME:  1 minute (24 mGy) COMPLICATIONS: None immediate. TECHNIQUE: Informed written consent was obtained from the patient after a discussion of the risks, benefits and alternatives to treatment. Questions regarding the procedure were encouraged and answered. A timeout was performed prior to the initiation of the procedure. The external portion of the existing right-sided percutaneous biliary drainage catheter as well as the surrounding skin were prepped and draped in the usual sterile fashion. A sterile drape was applied covering the operative field. Maximum barrier sterile technique with sterile gowns and gloves were used for the procedure. A timeout was performed prior to the initiation of the procedure. A pre procedural spot fluoroscopic image was obtained after contrast was injected via the existing biliary drainage catheter. The existing biliary drainage catheter was cut and cannulated with a stiff Glidewire wire which was coiled within the proximal small bowel. Under intermittent fluoroscopic guidance, the existing PBD was exchanged for a new Orick PBD. Small amount of contrast was injected via the exchanged biliary drainage catheter and a post exchange spot fluoroscopic image was obtained. The catheter was locked and secured to the skin with a single interrupted suture. The biliary drainage catheter was capped. A dressing was placed. The patient tolerated the procedure well without immediate postprocedural complication. FINDINGS: Preprocedural spot fluoroscopic image was obtained of the right upper abdominal quadrant existing percutaneous  biliary drainage catheter Images demonstrate grossly unchanged positioning of percutaneous biliary drainage catheter coursing along the endoscopically placed stent within the CBD. An additional stent is seen within descending portion of the duodenum. Contrast injection demonstrates decompression of the biliary system with passage of contrast preferentially through the endoscopically placed biliary stent. There is persistent mild narrowing involving the biliary hilum, cranial to the endoscopically placed stent. After successful fluoroscopic guided exchange, the new 12 French percutaneous biliary catheter is appropriately positioned with end coiled and locked within the proximal duodenum. IMPRESSION: Successful fluoroscopic guided exchange of right sided 12 French percutaneous biliary drainage catheter. PLAN: - Above discussed with referring gastroenterologist, Dr. [name]. - The patient will return for routine fluoroscopic guided percutaneous biliary drainage catheter exchange in approximately 1 month with repeat CMP level at that time. - At that time, the biliary drainage catheter will be exchange for a regular all-purpose drainage catheter which will be coiled and locked cranial to biliary stent. The all-purpose drainage catheter will be capped for a trial of internalization. - The patient will return to the interventional radiology department 1 week following this catheter conversion with a repeat CMP level and repeat cholangiogram. If the patient tolerates this trial of percutaneous biliary drainage capping, the drainage catheter may at that time. - If the patient experiences another episode of sepsis, he will  likely have to continue with routine fluoroscopic guided percutaneous biliary drainage catheter exchanges. Electronically Signed   By: Sandi Mariscal M.D.   On: 02/12/2017 17:15    Labs:  CBC:  Recent Labs  02/13/17 0539 02/14/17 0547 02/15/17 0625 02/16/17 0429  WBC 4.6 6.9 7.3 8.8  HGB 8.5* 8.9*  9.2* 9.2*  HCT 26.1* 27.3* 27.3* 27.1*  PLT 184 229 244 272    COAGS:  Recent Labs  10/12/16 2021 11/07/16 0945 02/09/17 0751  INR 0.98 1.01 1.01  APTT 30  --   --     BMP:  Recent Labs  02/13/17 0539 02/14/17 0547 02/15/17 0625 02/16/17 0429  NA 135 135 139 133*  K 3.3* 3.7 3.6 4.0  CL 103 101 102 100*  CO2 23 28 28 28   GLUCOSE 150* 181* 118* 208*  BUN 11 10 9 9   CALCIUM 8.0* 8.3* 8.5* 8.2*  CREATININE 0.67 0.72 0.66 0.69  GFRNONAA >60 >60 >60 >60  GFRAA >60 >60 >60 >60    LIVER FUNCTION TESTS:  Recent Labs  02/10/17 0511 02/12/17 0606 02/13/17 0539 02/15/17 0625  BILITOT 1.3* 1.1 0.7 0.9  AST 61* 58* 70* 56*  ALT 64* 60 62 55  ALKPHOS 569* 724* 825* 979*  PROT 5.1* 5.5* 5.2* 6.0*  ALBUMIN 2.0* 2.1* 2.0* 2.1*    Assessment and Plan: Metastatic pancreatic cancer, biliary obstruction, s/p prior internal/external biliary drain with wall flex stenting. Afebrile, bilirubin improved, WBC WNL.  The plan is for the patient to return for routine fluoroscopic guided percutaneous biliary drainage catheter exchange in approximately 1 month with repeat CMP level at that time. At that time, the biliary drainage catheter will be exchanged for a regular all-purpose drainage catheter which will be coiled and locked cranial to biliary stent. The all-purpose drainage catheter will be capped for a trial of internalization.He will then return to the interventional radiology department 1 week following this catheter conversion with a repeat CMP level and repeat cholangiogram.If the he tolerates this trial of percutaneous biliary drainage capping, the drainage catheter may be removed at that time. Orders have been placed for patient to return for exchange in 1 month.  Patient aware schedulers will contact him with date and time of appointment.  Once daily or every other day flushes at home.  Gravity bag for home in the event he does not tolerate capping.   Electronically  Signed: Docia Barrier 02/16/2017, 11:47 AM   I spent a total of 15 Minutes at the the patient's bedside AND on the patient's hospital floor or unit, greater than 50% of which was counseling/coordinating care for biliary obstruction

## 2017-02-18 ENCOUNTER — Ambulatory Visit (HOSPITAL_BASED_OUTPATIENT_CLINIC_OR_DEPARTMENT_OTHER): Payer: Medicare Other | Admitting: Oncology

## 2017-02-18 ENCOUNTER — Telehealth: Payer: Self-pay | Admitting: Oncology

## 2017-02-18 ENCOUNTER — Other Ambulatory Visit (HOSPITAL_BASED_OUTPATIENT_CLINIC_OR_DEPARTMENT_OTHER): Payer: Medicare Other

## 2017-02-18 ENCOUNTER — Ambulatory Visit: Payer: Medicare Other

## 2017-02-18 VITALS — BP 153/80 | HR 71 | Temp 98.1°F | Resp 17 | Ht 72.0 in | Wt 150.6 lb

## 2017-02-18 DIAGNOSIS — E119 Type 2 diabetes mellitus without complications: Secondary | ICD-10-CM | POA: Diagnosis not present

## 2017-02-18 DIAGNOSIS — D6481 Anemia due to antineoplastic chemotherapy: Secondary | ICD-10-CM | POA: Diagnosis not present

## 2017-02-18 DIAGNOSIS — C787 Secondary malignant neoplasm of liver and intrahepatic bile duct: Secondary | ICD-10-CM

## 2017-02-18 DIAGNOSIS — R740 Nonspecific elevation of levels of transaminase and lactic acid dehydrogenase [LDH]: Secondary | ICD-10-CM | POA: Diagnosis not present

## 2017-02-18 DIAGNOSIS — Z95828 Presence of other vascular implants and grafts: Secondary | ICD-10-CM

## 2017-02-18 DIAGNOSIS — Z452 Encounter for adjustment and management of vascular access device: Secondary | ICD-10-CM

## 2017-02-18 DIAGNOSIS — C250 Malignant neoplasm of head of pancreas: Secondary | ICD-10-CM | POA: Diagnosis not present

## 2017-02-18 LAB — CBC WITH DIFFERENTIAL/PLATELET
BASO%: 0.6 % (ref 0.0–2.0)
Basophils Absolute: 0.1 10*3/uL (ref 0.0–0.1)
EOS ABS: 0.2 10*3/uL (ref 0.0–0.5)
EOS%: 1.9 % (ref 0.0–7.0)
HCT: 30.9 % — ABNORMAL LOW (ref 38.4–49.9)
HGB: 10.2 g/dL — ABNORMAL LOW (ref 13.0–17.1)
LYMPH%: 8.1 % — AB (ref 14.0–49.0)
MCH: 29.4 pg (ref 27.2–33.4)
MCHC: 33 g/dL (ref 32.0–36.0)
MCV: 88.9 fL (ref 79.3–98.0)
MONO#: 0.6 10*3/uL (ref 0.1–0.9)
MONO%: 4.9 % (ref 0.0–14.0)
NEUT#: 9.9 10*3/uL — ABNORMAL HIGH (ref 1.5–6.5)
NEUT%: 84.5 % — AB (ref 39.0–75.0)
PLATELETS: 314 10*3/uL (ref 140–400)
RBC: 3.47 10*6/uL — ABNORMAL LOW (ref 4.20–5.82)
RDW: 17 % — ABNORMAL HIGH (ref 11.0–14.6)
WBC: 11.8 10*3/uL — ABNORMAL HIGH (ref 4.0–10.3)
lymph#: 1 10*3/uL (ref 0.9–3.3)

## 2017-02-18 LAB — COMPREHENSIVE METABOLIC PANEL
ALT: 93 U/L — AB (ref 0–55)
AST: 116 U/L — AB (ref 5–34)
Albumin: 2.6 g/dL — ABNORMAL LOW (ref 3.5–5.0)
Alkaline Phosphatase: 1620 U/L — ABNORMAL HIGH (ref 40–150)
Anion Gap: 11 mEq/L (ref 3–11)
BUN: 13.9 mg/dL (ref 7.0–26.0)
CALCIUM: 9.1 mg/dL (ref 8.4–10.4)
CHLORIDE: 98 meq/L (ref 98–109)
CO2: 26 mEq/L (ref 22–29)
CREATININE: 0.8 mg/dL (ref 0.7–1.3)
EGFR: 89 mL/min/{1.73_m2} — ABNORMAL LOW (ref >90)
Glucose: 273 mg/dl — ABNORMAL HIGH (ref 70–140)
Potassium: 4.4 mEq/L (ref 3.5–5.1)
Sodium: 135 mEq/L — ABNORMAL LOW (ref 136–145)
Total Bilirubin: 1.57 mg/dL — ABNORMAL HIGH (ref 0.20–1.20)
Total Protein: 6.8 g/dL (ref 6.4–8.3)

## 2017-02-18 MED ORDER — SODIUM CHLORIDE 0.9% FLUSH
10.0000 mL | INTRAVENOUS | Status: DC | PRN
  Administered 2017-02-18: 10 mL via INTRAVENOUS
  Filled 2017-02-18: qty 10

## 2017-02-18 MED ORDER — HEPARIN SOD (PORK) LOCK FLUSH 100 UNIT/ML IV SOLN
500.0000 [IU] | Freq: Once | INTRAVENOUS | Status: AC
  Administered 2017-02-18: 500 [IU] via INTRAVENOUS
  Filled 2017-02-18: qty 5

## 2017-02-18 NOTE — Telephone Encounter (Signed)
Message sent to [facility], CC Dr [name] & Audie Clear as per treatment being added for 02/25/17. Appointments pending Chemo start date. Patient advised. Will call patient, once 1st tx date is confirmed.  Patient's wife is extremely anxious and very agitated.

## 2017-02-18 NOTE — Patient Instructions (Signed)

## 2017-02-18 NOTE — Progress Notes (Signed)
[facility] OFFICE PROGRESS NOTE   Diagnosis: Pancreas cancer  INTERVAL HISTORY:   Jacob Hardin was discharged 02/16/2017 after an admission with Klebsiella bacteremia and C. difficile colitis. He reports having 1 bowel movement yesterday. He is completing outpatient antibiotics. No pain. He completed another cycle of FOLFIRI beginning 01/28/2017. He reports tolerating the chemotherapy well.  No fever since discharge from the hospital.  Objective:  Vital signs in last 24 hours:  Blood pressure (!) 153/80, pulse 71, temperature 98.1 F (36.7 C), temperature source Oral, resp. rate 17, height 6' (1.829 m), weight 150 lb 9.6 oz (68.3 kg), SpO2 100 %.    HEENT: No thrush or ulcers Resp: Lungs clear bilaterally Cardio: Regular rate and rhythm GI: No hepatosplenomegaly, nontender, right upper quadrant biliary drain site with a gauze dressing Vascular: No leg edema   Portacath/PICC-without erythema  Lab Results:  Lab Results  Component Value Date   WBC 11.8 (H) 02/18/2017   HGB 10.2 (L) 02/18/2017   HCT 30.9 (L) 02/18/2017   MCV 88.9 02/18/2017   PLT 314 02/18/2017   NEUTROABS 9.9 (H) 02/18/2017    Medications: I have reviewed the patient's current medications.  Assessment/Plan: 1.Pancreas cancer, stage IV, pancreas head mass, status post an EUS biopsy 11/18/2015 confirming adenocarcinoma  Upper endoscopy 11/18/2015 confirmed duodenal invasion/obstruction with a biopsy confirming adenocarcinoma  Placement of a duodenal stent 11/22/2015  MRI abdomen 11/15/2015 revealed a pancreas head mass and liver metastases  Cycle 1 FOLFIRINOX 12/06/2015  Cycle 2 FOLFIRINOX 12/21/2015  Cycle 3 FOLFIRINOX 01/04/2016  CT abdomen/pelvis 01/19/2016 showed improvement in the pancreatic head mass and liver metastases.  Cycle 4 FOLFIRINOX 02/01/2016  Cycle 5 FOLFIRINOX 02/15/2016  Cycle 6 FOLFIRINOX 02/29/2016  Cycle 7 FOLFIRINOX 03/14/2016  Cycle 8 FOLFIRINOX  03/28/2016  CT abdomen/pelvis 04/09/2016-improvement in pancreas head mass and liver metastases, no new metastatic disease  Chemotherapy switch to FOLFIRI every 3 weeks beginning 04/11/2016  CT 07/30/2016-improvement in liver metastases, stable pancreas mass   FOLFIRI continued on a 3 week schedule  FOLFIRI 09/19/2016.  Treatment subsequently placed on hold due to biliary obstruction  CT 10/13/2016-no change in liver metastases or pancreas mass  FOLFIRI resumed on a 3 week schedule 01/07/2017  2. Diabetes  3. History of Anorexia/weight loss secondary to #1  4. Obstructive jaundice secondary to #1-status post placement of a percutaneous internal/external biliary drain 11/25/2015; biliary drainage catheter capped 12/02/2015; biliary drain internalized 12/13/2015  5. Port-A-Cath placement 11/30/2015 interventional radiology  6. Admission 01/17/2016 with a high fever and rigors-no apparent source for infection upon review of his history and examination, blood cultures positive for gram-positive cocci--viridans streptococcus--completed 2 weeks of IV ceftriaxone; no endocarditis on TTE.  7. Oxaliplatin neuropathy  8. Elevated transaminases, alkaline phosphatase, and bilirubin 06/13/2016.06/19/2016 bilirubin improved to normal range; liver enzymes remain elevated.  9. Generalized pruritus,no rash 10/10/2016.Likely secondary to hyperbilirubinemia. Resolved 11/12/2016. Recurrent 12/03/2016.  10. Hospitalization 10/10/2016 through 10/20/2016 with biliary obstruction/sepsis. Status post placement of a biliary drain in interventional radiology 10/16/2016. Status post ERCP 10/26/2016 with biliary stent exchange. There was no bile drainage. Status post cholangiogram and exchange of biliary drain 11/07/2016.Biliary drain capped 11/12/2016.  Exchange of right sided percutaneous biliary drainage catheter 02/12/2017  11. Admission 02/09/2017 with Klebsiella  bacteremia  12. 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 C. difficile colitis 02/14/2017    Disposition:  Jacob Hardin appears well today. He has completed 2 treatments with FOLFIRI since resuming chemotherapy in March. He was hospitalized  over the past week with Klebsiella bacteremia and C. difficile colitis. He continues to recover from the recent hospital admission and will complete a course of cephalexin and vancomycin over the next week.  He will be scheduled for an office visit and another treatment with FOLFIRI on 02/25/2017. Jacob Hardin will contact us for a recurrent fever.  The plan is to complete at least 5 cycles of FOLFIRI prior to a restaging CT. We will check the CA 19-9 when he is here on 03/11/2017.  There is no clinical evidence of disease progression.  Betsy Coder, MD  02/18/2017  10:51 AM

## 2017-02-19 LAB — CANCER ANTIGEN 19-9: CA 19-9: 2068 U/mL — ABNORMAL HIGH (ref 0–35)

## 2017-02-20 ENCOUNTER — Telehealth: Payer: Self-pay | Admitting: Oncology

## 2017-02-20 NOTE — Telephone Encounter (Signed)
Spoke with patient's wife regarding upcoming appointments. Appointments was confirmed . Patient will pick up copy of appointment schedule at next visit. 02/20/17

## 2017-02-21 ENCOUNTER — Ambulatory Visit: Payer: Medicare Other

## 2017-02-22 ENCOUNTER — Other Ambulatory Visit: Payer: Self-pay | Admitting: *Deleted

## 2017-02-22 DIAGNOSIS — C250 Malignant neoplasm of head of pancreas: Secondary | ICD-10-CM

## 2017-02-22 MED ORDER — PANCRELIPASE (LIP-PROT-AMYL) 36000-114000 UNITS PO CPEP: 36000.0000 [IU] | ORAL_CAPSULE | Freq: Three times a day (TID) | ORAL | 1 refills | Status: AC

## 2017-02-23 ENCOUNTER — Other Ambulatory Visit: Payer: Self-pay | Admitting: Oncology

## 2017-02-26 ENCOUNTER — Ambulatory Visit (HOSPITAL_BASED_OUTPATIENT_CLINIC_OR_DEPARTMENT_OTHER): Payer: Medicare Other

## 2017-02-26 ENCOUNTER — Other Ambulatory Visit (HOSPITAL_BASED_OUTPATIENT_CLINIC_OR_DEPARTMENT_OTHER): Payer: Medicare Other

## 2017-02-26 ENCOUNTER — Ambulatory Visit (HOSPITAL_BASED_OUTPATIENT_CLINIC_OR_DEPARTMENT_OTHER): Payer: Medicare Other | Admitting: Nurse Practitioner

## 2017-02-26 ENCOUNTER — Telehealth: Payer: Self-pay | Admitting: Nurse Practitioner

## 2017-02-26 VITALS — BP 150/75 | HR 64 | Temp 99.0°F | Resp 18 | Ht 72.0 in | Wt 144.6 lb

## 2017-02-26 DIAGNOSIS — C250 Malignant neoplasm of head of pancreas: Secondary | ICD-10-CM

## 2017-02-26 DIAGNOSIS — Z5111 Encounter for antineoplastic chemotherapy: Secondary | ICD-10-CM

## 2017-02-26 DIAGNOSIS — G620 Drug-induced polyneuropathy: Secondary | ICD-10-CM

## 2017-02-26 DIAGNOSIS — E119 Type 2 diabetes mellitus without complications: Secondary | ICD-10-CM | POA: Diagnosis not present

## 2017-02-26 DIAGNOSIS — D6481 Anemia due to antineoplastic chemotherapy: Secondary | ICD-10-CM

## 2017-02-26 DIAGNOSIS — Z452 Encounter for adjustment and management of vascular access device: Secondary | ICD-10-CM | POA: Diagnosis not present

## 2017-02-26 DIAGNOSIS — Z95828 Presence of other vascular implants and grafts: Secondary | ICD-10-CM

## 2017-02-26 DIAGNOSIS — C787 Secondary malignant neoplasm of liver and intrahepatic bile duct: Secondary | ICD-10-CM

## 2017-02-26 DIAGNOSIS — R740 Nonspecific elevation of levels of transaminase and lactic acid dehydrogenase [LDH]: Secondary | ICD-10-CM | POA: Diagnosis not present

## 2017-02-26 LAB — CBC WITH DIFFERENTIAL/PLATELET
BASO%: 1.2 % (ref 0.0–2.0)
BASOS ABS: 0.1 10*3/uL (ref 0.0–0.1)
EOS ABS: 0.1 10*3/uL (ref 0.0–0.5)
EOS%: 1.5 % (ref 0.0–7.0)
HEMATOCRIT: 27.3 % — AB (ref 38.4–49.9)
HGB: 9.1 g/dL — ABNORMAL LOW (ref 13.0–17.1)
LYMPH#: 0.7 10*3/uL — AB (ref 0.9–3.3)
LYMPH%: 9.5 % — ABNORMAL LOW (ref 14.0–49.0)
MCH: 30.5 pg (ref 27.2–33.4)
MCHC: 33.4 g/dL (ref 32.0–36.0)
MCV: 91.2 fL (ref 79.3–98.0)
MONO#: 0.6 10*3/uL (ref 0.1–0.9)
MONO%: 7.6 % (ref 0.0–14.0)
NEUT#: 6 10*3/uL (ref 1.5–6.5)
NEUT%: 80.2 % — AB (ref 39.0–75.0)
PLATELETS: 208 10*3/uL (ref 140–400)
RBC: 2.99 10*6/uL — ABNORMAL LOW (ref 4.20–5.82)
RDW: 18.7 % — AB (ref 11.0–14.6)
WBC: 7.4 10*3/uL (ref 4.0–10.3)

## 2017-02-26 LAB — COMPREHENSIVE METABOLIC PANEL
ALBUMIN: 2.9 g/dL — AB (ref 3.5–5.0)
ALT: 75 U/L — ABNORMAL HIGH (ref 0–55)
ANION GAP: 8 meq/L (ref 3–11)
AST: 76 U/L — ABNORMAL HIGH (ref 5–34)
BILIRUBIN TOTAL: 1.39 mg/dL — AB (ref 0.20–1.20)
BUN: 15.2 mg/dL (ref 7.0–26.0)
CO2: 26 meq/L (ref 22–29)
CREATININE: 0.9 mg/dL (ref 0.7–1.3)
Calcium: 9 mg/dL (ref 8.4–10.4)
Chloride: 102 mEq/L (ref 98–109)
EGFR: 87 mL/min/{1.73_m2} — AB (ref >90)
GLUCOSE: 283 mg/dL — AB (ref 70–140)
Potassium: 4.1 mEq/L (ref 3.5–5.1)
Sodium: 135 mEq/L — ABNORMAL LOW (ref 136–145)
TOTAL PROTEIN: 6.9 g/dL (ref 6.4–8.3)

## 2017-02-26 MED ORDER — PALONOSETRON HCL INJECTION 0.25 MG/5ML
INTRAVENOUS | Status: AC
  Filled 2017-02-26: qty 5

## 2017-02-26 MED ORDER — SODIUM CHLORIDE 0.9 % IV SOLN
Freq: Once | INTRAVENOUS | Status: AC
  Administered 2017-02-26: 12:00:00 via INTRAVENOUS

## 2017-02-26 MED ORDER — SODIUM CHLORIDE 0.9 % IJ SOLN
10.0000 mL | INTRAMUSCULAR | Status: DC | PRN
  Administered 2017-02-26: 10 mL via INTRAVENOUS
  Filled 2017-02-26: qty 10

## 2017-02-26 MED ORDER — SODIUM CHLORIDE 0.9 % IV SOLN
400.0000 mg/m2 | Freq: Once | INTRAVENOUS | Status: AC
  Administered 2017-02-26: 744 mg via INTRAVENOUS
  Filled 2017-02-26: qty 37.2

## 2017-02-26 MED ORDER — PALONOSETRON HCL INJECTION 0.25 MG/5ML
0.2500 mg | Freq: Once | INTRAVENOUS | Status: AC
Start: 2017-02-26 — End: 2017-02-26
  Administered 2017-02-26: 0.25 mg via INTRAVENOUS

## 2017-02-26 MED ORDER — ATROPINE SULFATE 1 MG/ML IJ SOLN
0.5000 mg | Freq: Once | INTRAMUSCULAR | Status: DC | PRN
Start: 2017-02-26 — End: 2017-02-26

## 2017-02-26 MED ORDER — SODIUM CHLORIDE 0.9 % IV SOLN
135.0000 mg/m2 | Freq: Once | INTRAVENOUS | Status: AC
  Administered 2017-02-26: 260 mg via INTRAVENOUS
  Filled 2017-02-26: qty 10

## 2017-02-26 MED ORDER — SODIUM CHLORIDE 0.9 % IV SOLN
Freq: Once | INTRAVENOUS | Status: AC
  Administered 2017-02-26: 13:00:00 via INTRAVENOUS
  Filled 2017-02-26: qty 5

## 2017-02-26 MED ORDER — SODIUM CHLORIDE 0.9 % IV SOLN
2400.0000 mg/m2 | INTRAVENOUS | Status: DC
  Administered 2017-02-26: 4450 mg via INTRAVENOUS
  Filled 2017-02-26: qty 89

## 2017-02-26 NOTE — Progress Notes (Signed)
Per [name] office note 02/26/17 okay to proceed with treatment with Lab results (liver enzymes ).

## 2017-02-26 NOTE — Patient Instructions (Signed)
[place] Discharge Instructions for Patients Receiving Chemotherapy  Today you received the following chemotherapy agents: Leucovorin, Irinotecan, Adrucil   To help prevent nausea and vomiting after your treatment, we encourage you to take your nausea medication as directed.    If you develop nausea and vomiting that is not controlled by your nausea medication, call the clinic.   BELOW ARE SYMPTOMS THAT SHOULD BE REPORTED IMMEDIATELY:  *FEVER GREATER THAN 100.5 F  *CHILLS WITH OR WITHOUT FEVER  NAUSEA AND VOMITING THAT IS NOT CONTROLLED WITH YOUR NAUSEA MEDICATION  *UNUSUAL SHORTNESS OF BREATH  *UNUSUAL BRUISING OR BLEEDING  TENDERNESS IN MOUTH AND THROAT WITH OR WITHOUT PRESENCE OF ULCERS  *URINARY PROBLEMS  *BOWEL PROBLEMS  UNUSUAL RASH Items with * indicate a potential emergency and should be followed up as soon as possible.  Feel free to call the clinic you have any questions or concerns. The clinic phone number is [phone].  Please show the [place] at check-in to the Emergency Department and triage nurse.

## 2017-02-26 NOTE — Patient Instructions (Signed)
Implanted Port Home Guide An implanted port is a type of central line that is placed under the skin. Central lines are used to provide IV access when treatment or nutrition needs to be given through a person's veins. Implanted ports are used for long-term IV access. An implanted port may be placed because:  You need IV medicine that would be irritating to the small veins in your hands or arms.  You need long-term IV medicines, such as antibiotics.  You need IV nutrition for a long period.  You need frequent blood draws for lab tests.  You need dialysis.  Implanted ports are usually placed in the chest area, but they can also be placed in the upper arm, the abdomen, or the leg. An implanted port has two main parts:  Reservoir. The reservoir is round and will appear as a small, raised area under your skin. The reservoir is the part where a needle is inserted to give medicines or draw blood.  Catheter. The catheter is a thin, flexible tube that extends from the reservoir. The catheter is placed into a large vein. Medicine that is inserted into the reservoir goes into the catheter and then into the vein.  How will I care for my incision site? Do not get the incision site wet. Bathe or shower as directed by your health care provider. How is my port accessed? Special steps must be taken to access the port:  Before the port is accessed, a numbing cream can be placed on the skin. This helps numb the skin over the port site.  Your health care provider uses a sterile technique to access the port. ? Your health care provider must put on a mask and sterile gloves. ? The skin over your port is cleaned carefully with an antiseptic and allowed to dry. ? The port is gently pinched between sterile gloves, and a needle is inserted into the port.  Only "non-coring" port needles should be used to access the port. Once the port is accessed, a blood return should be checked. This helps ensure that the port  is in the vein and is not clogged.  If your port needs to remain accessed for a constant infusion, a clear (transparent) bandage will be placed over the needle site. The bandage and needle will need to be changed every week, or as directed by your health care provider.  Keep the bandage covering the needle clean and dry. Do not get it wet. Follow your health care provider's instructions on how to take a shower or bath while the port is accessed.  If your port does not need to stay accessed, no bandage is needed over the port.  What is flushing? Flushing helps keep the port from getting clogged. Follow your health care provider's instructions on how and when to flush the port. Ports are usually flushed with saline solution or a medicine called heparin. The need for flushing will depend on how the port is used.  If the port is used for intermittent medicines or blood draws, the port will need to be flushed: ? After medicines have been given. ? After blood has been drawn. ? As part of routine maintenance.  If a constant infusion is running, the port may not need to be flushed.  How long will my port stay implanted? The port can stay in for as long as your health care provider thinks it is needed. When it is time for the port to come out, surgery will be   done to remove it. The procedure is similar to the one performed when the port was put in. When should I seek immediate medical care? When you have an implanted port, you should seek immediate medical care if:  You notice a bad smell coming from the incision site.  You have swelling, redness, or drainage at the incision site.  You have more swelling or pain at the port site or the surrounding area.  You have a fever that is not controlled with medicine.  This information is not intended to replace advice given to you by your health care provider. Make sure you discuss any questions you have with your health care provider. Document  Released: 10/08/2005 Document Revised: 03/15/2016 Document Reviewed: 06/15/2013 Elsevier Interactive Patient Education  2017 [employer] Inc[employer]  

## 2017-02-26 NOTE — Telephone Encounter (Signed)
Appointments scheduled per 02/26/17 LOS. Patient given AVS report and calendars with future scheduled appointments.  °

## 2017-02-26 NOTE — Progress Notes (Signed)
[place] OFFICE PROGRESS NOTE   Diagnosis:  Pancreas cancer  INTERVAL HISTORY:   Jacob Hardin returns as scheduled. He reports persistent generalized pruritus. This is affecting his sleep. Appetite is poor. He is losing weight. He denies nausea. No diarrhea. No fever. He reports he has completed the course of vancomycin and Keflex.  Objective:  Vital signs in last 24 hours:  Blood pressure (!) 150/75, pulse 64, temperature 99 F (37.2 C), temperature source Oral, resp. rate 18, height 6' (1.829 m), weight 144 lb 9.6 oz (65.6 kg), SpO2 100 %.    HEENT: No thrush or ulcers. Sclera anicteric. Resp: Lungs clear bilaterally. Cardio: Regular rate and rhythm. GI: Abdomen soft and nontender. No hepatomegaly. Vascular: No leg edema. Skin: No rash. Port-A-Cath without erythema.    Lab Results:  Lab Results  Component Value Date   WBC 7.4 02/26/2017   HGB 9.1 (L) 02/26/2017   HCT 27.3 (L) 02/26/2017   MCV 91.2 02/26/2017   PLT 208 02/26/2017   NEUTROABS 6.0 02/26/2017    Imaging:  No results found.  Medications: I have reviewed the patient's current medications.  Assessment/Plan: 1.Pancreas cancer, stage IV, pancreas head mass, status post an EUS biopsy 11/18/2015 confirming adenocarcinoma  Upper endoscopy 11/18/2015 confirmed duodenal invasion/obstruction with a biopsy confirming adenocarcinoma  Placement of a duodenal stent 11/22/2015  MRI abdomen 11/15/2015 revealed a pancreas head mass and liver metastases  Cycle 1 FOLFIRINOX 12/06/2015  Cycle 2 FOLFIRINOX 12/21/2015  Cycle 3 FOLFIRINOX 01/04/2016  CT abdomen/pelvis 01/19/2016 showed improvement in the pancreatic head mass and liver metastases.  Cycle 4 FOLFIRINOX 02/01/2016  Cycle 5 FOLFIRINOX 02/15/2016  Cycle 6 FOLFIRINOX 02/29/2016  Cycle 7 FOLFIRINOX 03/14/2016  Cycle 8 FOLFIRINOX 03/28/2016  CT abdomen/pelvis 04/09/2016-improvement in pancreas head mass and liver metastases, no  new metastatic disease  Chemotherapy switch to FOLFIRI every 3 weeks beginning 04/11/2016  CT 07/30/2016-improvement in liver metastases, stable pancreas mass   FOLFIRI continued on a 3 week schedule  FOLFIRI 09/19/2016.  Treatment subsequently placed on hold due to biliary obstruction  CT 10/13/2016-no change in liver metastases or pancreas mass  FOLFIRI resumed on a 3 week schedule 01/07/2017  FOLFIRI 01/28/2017  FOLFIRI 02/26/2017  2. Diabetes  3. History of Anorexia/weight loss secondary to #1  4. Obstructive jaundice secondary to #1-status post placement of a percutaneous internal/external biliary drain 11/25/2015; biliary drainage catheter capped 12/02/2015; biliary drain internalized 12/13/2015  5. Port-A-Cath placement 11/30/2015 interventional radiology  6. Admission 01/17/2016 with a high fever and rigors-no apparent source for infection upon review of his history and examination, blood cultures positive for gram-positive cocci--viridans streptococcus--completed 2 weeks of IV ceftriaxone; no endocarditis on TTE.  7. Oxaliplatin neuropathy  8. Elevated transaminases, alkaline phosphatase, and bilirubin 06/13/2016.06/19/2016 bilirubin improved to normal range; liver enzymes remain elevated.  9. Generalized pruritus,no rash 10/10/2016.Likely secondary to hyperbilirubinemia. Resolved 11/12/2016. Recurrent 12/03/2016.  10. Hospitalization 10/10/2016 through 10/20/2016 with biliary obstruction/sepsis. Status post placement of a biliary drain in interventional radiology 10/16/2016. Status post ERCP 10/26/2016 with biliary stent exchange. There was no bile drainage. Status post cholangiogram and exchange of biliary drain 11/07/2016.Biliary drain capped 11/12/2016.  Exchange of right sided percutaneous biliary drainage catheter 02/12/2017  11. Admission 02/09/2017 with Klebsiella bacteremia  12. Anemia secondary to chemotherapy and chronic  disease  13. C. difficile colitis 02/14/2017 status post course of vancomycin.  Disposition: Jacob Hardin appears stable. The liver function tests are better. Plan to proceed with FOLFIRI today as scheduled.  He has persistent pruritus. He biliary drainage catheter is currently capped. He will connect the drainage bag to see if this helps with the pruritus.  He will return for a follow-up visit and FOLFIRI in 2 weeks. He will contact the office in the interim with any problems.  Plan reviewed with Jacob Hardin.    Jacob Hardin ANP/GNP-BC   02/26/2017  10:54 AM

## 2017-02-28 ENCOUNTER — Ambulatory Visit (HOSPITAL_BASED_OUTPATIENT_CLINIC_OR_DEPARTMENT_OTHER): Payer: Medicare Other

## 2017-02-28 VITALS — BP 169/72 | HR 58 | Temp 97.7°F | Resp 16

## 2017-02-28 DIAGNOSIS — C250 Malignant neoplasm of head of pancreas: Secondary | ICD-10-CM

## 2017-02-28 DIAGNOSIS — Z452 Encounter for adjustment and management of vascular access device: Secondary | ICD-10-CM

## 2017-02-28 MED ORDER — HEPARIN SOD (PORK) LOCK FLUSH 100 UNIT/ML IV SOLN
500.0000 [IU] | Freq: Once | INTRAVENOUS | Status: AC | PRN
  Administered 2017-02-28: 500 [IU]
  Filled 2017-02-28: qty 5

## 2017-02-28 MED ORDER — SODIUM CHLORIDE 0.9% FLUSH
10.0000 mL | INTRAVENOUS | Status: DC | PRN
  Administered 2017-02-28: 10 mL
  Filled 2017-02-28: qty 10

## 2017-02-28 NOTE — Progress Notes (Signed)
Patient reports feeling tired and nauseous for past 2 days. Patient reports having "several" episodes of vomiting over the past 2 days. Patient reports only having taken 1 compazine tablet this AM for nausea, which he reports "didn't help much". Patient has zofran tablets and compazine tablets at home. Patient reports decreased oral intake of food over the past two days but that he has been maintaining his fluid intake. MD [facility] notified. Patient does not want to stay for IV fluid infusion. Per MD [name]., ok to discharge patient. Per MD [facility], Patient instructed to take his compazine and zofran at home and to keep drinking adequate fluids. Patient is instructed to call [facility] tomorrow if nausea has not improved. Paitent and wife verbalize understanding of instructions and agree with plan of care.

## 2017-02-28 NOTE — Patient Instructions (Signed)
Dehydration, Adult Dehydration is a condition in which there is not enough fluid or water in the body. This happens when you lose more fluids than you take in. Important organs, such as the kidneys, brain, and heart, cannot function without a proper amount of fluids. Any loss of fluids from the body can lead to dehydration. Dehydration can range from mild to severe. This condition should be treated right away to prevent it from becoming severe. What are the causes? This condition may be caused by:  Vomiting.  Diarrhea.  Excessive sweating, such as from heat exposure or exercise.  Not drinking enough fluid, especially:  When ill.  While doing activity that requires a lot of energy.  Excessive urination.  Fever.  Infection.  Certain medicines, such as medicines that cause the body to lose excess fluid (diuretics).  Inability to access safe drinking water.  Reduced physical ability to get adequate water and food. What increases the risk? This condition is more likely to develop in people:  Who have a poorly controlled long-term (chronic) illness, such as diabetes, heart disease, or kidney disease.  Who are age 65 or older.  Who are disabled.  Who live in a place with high altitude.  Who play endurance sports. What are the signs or symptoms? Symptoms of mild dehydration may include:   Thirst.  Dry lips.  Slightly dry mouth.  Dry, warm skin.  Dizziness. Symptoms of moderate dehydration may include:   Very dry mouth.  Muscle cramps.  Dark urine. Urine may be the color of tea.  Decreased urine production.  Decreased tear production.  Heartbeat that is irregular or faster than normal (palpitations).  Headache.  Light-headedness, especially when you stand up from a sitting position.  Fainting (syncope). Symptoms of severe dehydration may include:   Changes in skin, such as:  Cold and clammy skin.  Blotchy (mottled) or pale skin.  Skin that does  not quickly return to normal after being lightly pinched and released (poor skin turgor).  Changes in body fluids, such as:  Extreme thirst.  No tear production.  Inability to sweat when body temperature is high, such as in hot weather.  Very little urine production.  Changes in vital signs, such as:  Weak pulse.  Pulse that is more than 100 beats a minute when sitting still.  Rapid breathing.  Low blood pressure.  Other changes, such as:  Sunken eyes.  Cold hands and feet.  Confusion.  Lack of energy (lethargy).  Difficulty waking up from sleep.  Short-term weight loss.  Unconsciousness. How is this diagnosed? This condition is diagnosed based on your symptoms and a physical exam. Blood and urine tests may be done to help confirm the diagnosis. How is this treated? Treatment for this condition depends on the severity. Mild or moderate dehydration can often be treated at home. Treatment should be started right away. Do not wait until dehydration becomes severe. Severe dehydration is an emergency and it needs to be treated in a hospital. Treatment for mild dehydration may include:   Drinking more fluids.  Replacing salts and minerals in your blood (electrolytes) that you may have lost. Treatment for moderate dehydration may include:   Drinking an oral rehydration solution (ORS). This is a drink that helps you replace fluids and electrolytes (rehydrate). It can be found at pharmacies and retail stores. Treatment for severe dehydration may include:   Receiving fluids through an IV tube.  Receiving an electrolyte solution through a feeding tube that is   passed through your nose and into your stomach (nasogastric tube, or NG tube).  Correcting any abnormalities in electrolytes.  Treating the underlying cause of dehydration. Follow these instructions at home:  If directed by your health care provider, drink an ORS:  Make an ORS by following instructions on the  package.  Start by drinking small amounts, about  cup (120 mL) every 5-10 minutes.  Slowly increase how much you drink until you have taken the amount recommended by your health care provider.  Drink enough clear fluid to keep your urine clear or pale yellow. If you were told to drink an ORS, finish the ORS first, then start slowly drinking other clear fluids. Drink fluids such as:  Water. Do not drink only water. Doing that can lead to having too little salt (sodium) in the body (hyponatremia).  Ice chips.  Fruit juice that you have added water to (diluted fruit juice).  Low-calorie sports drinks.  Avoid:  Alcohol.  Drinks that contain a lot of sugar. These include high-calorie sports drinks, fruit juice that is not diluted, and soda.  Caffeine.  Foods that are greasy or contain a lot of fat or sugar.  Take over-the-counter and prescription medicines only as told by your health care provider.  Do not take sodium tablets. This can lead to having too much sodium in the body (hypernatremia).  Eat foods that contain a healthy balance of electrolytes, such as bananas, oranges, potatoes, tomatoes, and spinach.  Keep all follow-up visits as told by your health care provider. This is important. Contact a health care provider if:  You have abdominal pain that:  Gets worse.  Stays in one area (localizes).  You have a rash.  You have a stiff neck.  You are more irritable than usual.  You are sleepier or more difficult to wake up than usual.  You feel weak or dizzy.  You feel very thirsty.  You have urinated only a small amount of very dark urine over 6-8 hours. Get help right away if:  You have symptoms of severe dehydration.  You cannot drink fluids without vomiting.  Your symptoms get worse with treatment.  You have a fever.  You have a severe headache.  You have vomiting or diarrhea that:  Gets worse.  Does not go away.  You have blood or green matter  (bile) in your vomit.  You have blood in your stool. This may cause stool to look black and tarry.  You have not urinated in 6-8 hours.  You faint.  Your heart rate while sitting still is over 100 beats a minute.  You have trouble breathing. This information is not intended to replace advice given to you by your health care provider. Make sure you discuss any questions you have with your health care provider. Document Released: 10/08/2005 Document Revised: 05/04/2016 Document Reviewed: 12/02/2015 Elsevier Interactive Patient Education  2017 [employer]  

## 2017-03-05 ENCOUNTER — Ambulatory Visit (HOSPITAL_BASED_OUTPATIENT_CLINIC_OR_DEPARTMENT_OTHER): Payer: Medicare Other

## 2017-03-05 ENCOUNTER — Telehealth: Payer: Self-pay | Admitting: *Deleted

## 2017-03-05 ENCOUNTER — Ambulatory Visit (HOSPITAL_BASED_OUTPATIENT_CLINIC_OR_DEPARTMENT_OTHER): Payer: Medicare Other | Admitting: Nurse Practitioner

## 2017-03-05 VITALS — BP 164/78 | HR 63

## 2017-03-05 VITALS — BP 138/78 | HR 63 | Temp 98.6°F | Resp 18 | Ht 72.0 in | Wt 137.3 lb

## 2017-03-05 DIAGNOSIS — C250 Malignant neoplasm of head of pancreas: Secondary | ICD-10-CM

## 2017-03-05 DIAGNOSIS — R630 Anorexia: Secondary | ICD-10-CM

## 2017-03-05 DIAGNOSIS — R112 Nausea with vomiting, unspecified: Secondary | ICD-10-CM | POA: Diagnosis not present

## 2017-03-05 DIAGNOSIS — Z95828 Presence of other vascular implants and grafts: Secondary | ICD-10-CM

## 2017-03-05 MED ORDER — HEPARIN SOD (PORK) LOCK FLUSH 100 UNIT/ML IV SOLN
500.0000 [IU] | Freq: Once | INTRAVENOUS | Status: AC
  Administered 2017-03-05: 500 [IU] via INTRAVENOUS
  Filled 2017-03-05: qty 5

## 2017-03-05 MED ORDER — ONDANSETRON HCL 8 MG PO TABS: 8.0000 mg | ORAL_TABLET | Freq: Three times a day (TID) | ORAL | 1 refills | Status: DC | PRN

## 2017-03-05 MED ORDER — DEXAMETHASONE SODIUM PHOSPHATE 10 MG/ML IJ SOLN
10.0000 mg | Freq: Once | INTRAMUSCULAR | Status: AC
  Administered 2017-03-05: 10 mg via INTRAVENOUS

## 2017-03-05 MED ORDER — SODIUM CHLORIDE 0.9% FLUSH
10.0000 mL | Freq: Once | INTRAVENOUS | Status: AC
  Administered 2017-03-05: 10 mL via INTRAVENOUS
  Filled 2017-03-05: qty 10

## 2017-03-05 MED ORDER — SODIUM CHLORIDE 0.9 % IJ SOLN
10.0000 mL | INTRAMUSCULAR | Status: DC | PRN
  Administered 2017-03-05: 10 mL via INTRAVENOUS
  Filled 2017-03-05: qty 10

## 2017-03-05 MED ORDER — DEXAMETHASONE SODIUM PHOSPHATE 10 MG/ML IJ SOLN
INTRAMUSCULAR | Status: AC
  Filled 2017-03-05: qty 1

## 2017-03-05 MED ORDER — DEXAMETHASONE SODIUM PHOSPHATE 100 MG/10ML IJ SOLN
10.0000 mg | Freq: Once | INTRAMUSCULAR | Status: DC
Start: 2017-03-05 — End: 2017-03-05

## 2017-03-05 MED ORDER — SODIUM CHLORIDE 0.9 % IV SOLN
INTRAVENOUS | Status: AC
Start: 2017-03-05 — End: 2017-03-05
  Administered 2017-03-05: 13:00:00 via INTRAVENOUS

## 2017-03-05 MED ORDER — PROCHLORPERAZINE MALEATE 10 MG PO TABS: 10.0000 mg | ORAL_TABLET | Freq: Four times a day (QID) | ORAL | 1 refills | Status: AC | PRN

## 2017-03-05 MED ORDER — DEXAMETHASONE 4 MG PO TABS: ORAL_TABLET | ORAL | 0 refills | Status: DC

## 2017-03-05 NOTE — Telephone Encounter (Signed)
Message received from patient's wife this AM stating that patient has had intermittent emesis, is losing weight and has no appetite since last chemo.  Dr. [name] notified and call placed back to patient to inform him to come in to [facility] today at 11:15AM to see L. Marcello Moores NP.  Patient appreciative of call back and will be here at 11:15AM to be seen.

## 2017-03-05 NOTE — Patient Instructions (Signed)
Dehydration, Adult Dehydration is a condition in which there is not enough fluid or water in the body. This happens when you lose more fluids than you take in. Important organs, such as the kidneys, brain, and heart, cannot function without a proper amount of fluids. Any loss of fluids from the body can lead to dehydration. Dehydration can range from mild to severe. This condition should be treated right away to prevent it from becoming severe. What are the causes? This condition may be caused by:  Vomiting.  Diarrhea.  Excessive sweating, such as from heat exposure or exercise.  Not drinking enough fluid, especially:  When ill.  While doing activity that requires a lot of energy.  Excessive urination.  Fever.  Infection.  Certain medicines, such as medicines that cause the body to lose excess fluid (diuretics).  Inability to access safe drinking water.  Reduced physical ability to get adequate water and food. What increases the risk? This condition is more likely to develop in people:  Who have a poorly controlled long-term (chronic) illness, such as diabetes, heart disease, or kidney disease.  Who are age 65 or older.  Who are disabled.  Who live in a place with high altitude.  Who play endurance sports. What are the signs or symptoms? Symptoms of mild dehydration may include:   Thirst.  Dry lips.  Slightly dry mouth.  Dry, warm skin.  Dizziness. Symptoms of moderate dehydration may include:   Very dry mouth.  Muscle cramps.  Dark urine. Urine may be the color of tea.  Decreased urine production.  Decreased tear production.  Heartbeat that is irregular or faster than normal (palpitations).  Headache.  Light-headedness, especially when you stand up from a sitting position.  Fainting (syncope). Symptoms of severe dehydration may include:   Changes in skin, such as:  Cold and clammy skin.  Blotchy (mottled) or pale skin.  Skin that does  not quickly return to normal after being lightly pinched and released (poor skin turgor).  Changes in body fluids, such as:  Extreme thirst.  No tear production.  Inability to sweat when body temperature is high, such as in hot weather.  Very little urine production.  Changes in vital signs, such as:  Weak pulse.  Pulse that is more than 100 beats a minute when sitting still.  Rapid breathing.  Low blood pressure.  Other changes, such as:  Sunken eyes.  Cold hands and feet.  Confusion.  Lack of energy (lethargy).  Difficulty waking up from sleep.  Short-term weight loss.  Unconsciousness. How is this diagnosed? This condition is diagnosed based on your symptoms and a physical exam. Blood and urine tests may be done to help confirm the diagnosis. How is this treated? Treatment for this condition depends on the severity. Mild or moderate dehydration can often be treated at home. Treatment should be started right away. Do not wait until dehydration becomes severe. Severe dehydration is an emergency and it needs to be treated in a hospital. Treatment for mild dehydration may include:   Drinking more fluids.  Replacing salts and minerals in your blood (electrolytes) that you may have lost. Treatment for moderate dehydration may include:   Drinking an oral rehydration solution (ORS). This is a drink that helps you replace fluids and electrolytes (rehydrate). It can be found at pharmacies and retail stores. Treatment for severe dehydration may include:   Receiving fluids through an IV tube.  Receiving an electrolyte solution through a feeding tube that is   passed through your nose and into your stomach (nasogastric tube, or NG tube).  Correcting any abnormalities in electrolytes.  Treating the underlying cause of dehydration. Follow these instructions at home:  If directed by your health care provider, drink an ORS:  Make an ORS by following instructions on the  package.  Start by drinking small amounts, about  cup (120 mL) every 5-10 minutes.  Slowly increase how much you drink until you have taken the amount recommended by your health care provider.  Drink enough clear fluid to keep your urine clear or pale yellow. If you were told to drink an ORS, finish the ORS first, then start slowly drinking other clear fluids. Drink fluids such as:  Water. Do not drink only water. Doing that can lead to having too little salt (sodium) in the body (hyponatremia).  Ice chips.  Fruit juice that you have added water to (diluted fruit juice).  Low-calorie sports drinks.  Avoid:  Alcohol.  Drinks that contain a lot of sugar. These include high-calorie sports drinks, fruit juice that is not diluted, and soda.  Caffeine.  Foods that are greasy or contain a lot of fat or sugar.  Take over-the-counter and prescription medicines only as told by your health care provider.  Do not take sodium tablets. This can lead to having too much sodium in the body (hypernatremia).  Eat foods that contain a healthy balance of electrolytes, such as bananas, oranges, potatoes, tomatoes, and spinach.  Keep all follow-up visits as told by your health care provider. This is important. Contact a health care provider if:  You have abdominal pain that:  Gets worse.  Stays in one area (localizes).  You have a rash.  You have a stiff neck.  You are more irritable than usual.  You are sleepier or more difficult to wake up than usual.  You feel weak or dizzy.  You feel very thirsty.  You have urinated only a small amount of very dark urine over 6-8 hours. Get help right away if:  You have symptoms of severe dehydration.  You cannot drink fluids without vomiting.  Your symptoms get worse with treatment.  You have a fever.  You have a severe headache.  You have vomiting or diarrhea that:  Gets worse.  Does not go away.  You have blood or green matter  (bile) in your vomit.  You have blood in your stool. This may cause stool to look black and tarry.  You have not urinated in 6-8 hours.  You faint.  Your heart rate while sitting still is over 100 beats a minute.  You have trouble breathing. This information is not intended to replace advice given to you by your health care provider. Make sure you discuss any questions you have with your health care provider. Document Released: 10/08/2005 Document Revised: 05/04/2016 Document Reviewed: 12/02/2015 Elsevier Interactive Patient Education  2017 [employer]  

## 2017-03-05 NOTE — Progress Notes (Addendum)
[place] OFFICE PROGRESS NOTE   Diagnosis:  Pancreas cancer  INTERVAL HISTORY:   Jacob Hardin returns prior to scheduled follow-up due to persistent nausea/vomiting. He completed a cycle of FOLFIRI beginning 02/26/2017. Since then he has been experiencing intermittent nausea/vomiting. He notes the nausea typically occurs in the evening hours. Appetite is decreased. He denies pain. No fever. No diarrhea. He continues to have generalized pruritus. Oral intake is "less than recommended".  Objective:  Vital signs in last 24 hours:  Blood pressure 138/78, pulse 63, temperature 98.6 F (37 C), temperature source Oral, resp. rate 18, height 6' (1.829 m), weight 137 lb 4.8 oz (62.3 kg), SpO2 100 %.    HEENT: No thrush or ulcers. Resp: Lungs clear bilaterally. Cardio: Regular rate and rhythm. GI: Abdomen soft and nontender. No hepatomegaly. Bandage covering biliary drain right abdomen. Vascular: No leg edema. Neuro: Alert and oriented.  Skin: No rash. Port-A-Cath without erythema.    Lab Results:  Lab Results  Component Value Date   WBC 7.4 02/26/2017   HGB 9.1 (L) 02/26/2017   HCT 27.3 (L) 02/26/2017   MCV 91.2 02/26/2017   PLT 208 02/26/2017   NEUTROABS 6.0 02/26/2017    Imaging:  No results found.  Medications: I have reviewed the patient's current medications.  Assessment/Plan: 1.Pancreas cancer, stage IV, pancreas head mass, status post an EUS biopsy 11/18/2015 confirming adenocarcinoma  Upper endoscopy 11/18/2015 confirmed duodenal invasion/obstruction with a biopsy confirming adenocarcinoma  Placement of a duodenal stent 11/22/2015  MRI abdomen 11/15/2015 revealed a pancreas head mass and liver metastases  Cycle 1 FOLFIRINOX 12/06/2015  Cycle 2 FOLFIRINOX 12/21/2015  Cycle 3 FOLFIRINOX 01/04/2016  CT abdomen/pelvis 01/19/2016 showed improvement in the pancreatic head mass and liver metastases.  Cycle 4 FOLFIRINOX 02/01/2016  Cycle 5  FOLFIRINOX 02/15/2016  Cycle 6 FOLFIRINOX 02/29/2016  Cycle 7 FOLFIRINOX 03/14/2016  Cycle 8 FOLFIRINOX 03/28/2016  CT abdomen/pelvis 04/09/2016-improvement in pancreas head mass and liver metastases, no new metastatic disease  Chemotherapy switch to FOLFIRI every 3 weeks beginning 04/11/2016  CT 07/30/2016-improvement in liver metastases, stable pancreas mass   FOLFIRI continued on a 3 week schedule  FOLFIRI 09/19/2016.  Treatment subsequently placed on hold due to biliary obstruction  CT 10/13/2016-no change in liver metastases or pancreas mass  FOLFIRI resumed on a 3 week schedule 01/07/2017  FOLFIRI 01/28/2017  FOLFIRI 02/26/2017  2. Diabetes  3. History of Anorexia/weight loss secondary to #1  4. Obstructive jaundice secondary to #1-status post placement of a percutaneous internal/external biliary drain 11/25/2015; biliary drainage catheter capped 12/02/2015; biliary drain internalized 12/13/2015  5. Port-A-Cath placement 11/30/2015 interventional radiology  6. Admission 01/17/2016 with a high fever and rigors-no apparent source for infection upon review of his history and examination, blood cultures positive for gram-positive cocci--viridans streptococcus--completed 2 weeks of IV ceftriaxone; no endocarditis on TTE.  7. Oxaliplatin neuropathy  8. Elevated transaminases, alkaline phosphatase, and bilirubin 06/13/2016.06/19/2016 bilirubin improved to normal range; liver enzymes remain elevated.  9. Generalized pruritus,no rash 10/10/2016.Likely secondary to hyperbilirubinemia. Resolved 11/12/2016. Recurrent 12/03/2016.  10. Hospitalization 10/10/2016 through 10/20/2016 with biliary obstruction/sepsis. Status post placement of a biliary drain in interventional radiology 10/16/2016. Status post ERCP 10/26/2016 with biliary stent exchange. There was no bile drainage. Status post cholangiogram and exchange of biliary drain 11/07/2016.Biliary  drain capped 11/12/2016.  Exchange of right sided percutaneous biliary drainage catheter 02/12/2017  11. 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 C. difficile colitis  02/14/2017 status post course of vancomycin.   Disposition: [facility] Hardin appears stable. He completed a cycle of FOLFIRI 02/26/2017. Since then he has been experiencing nausea/vomiting. We discussed possible etiologies including delayed nausea related to chemotherapy, nausea related to cancer, nausea related to biliary obstruction.   We decided to treat this as delayed nausea related to chemotherapy. He will receive a liter of IV fluids and IV Decadron while in the office today. He will continue Decadron twice a day for the next 2 days at home. If his symptoms do not improve he will contact the office.  He is scheduled to return for a follow-up visit on 03/11/2017. We will see him sooner as needed.  Patient seen with Dr. [name]. 25 minutes were spent face-to-face at today's visit with the majority of that time involved in counseling/coordination of care.    [name] ANP/GNP-BC   03/05/2017  1:28 PM  This was a shared visit with [name]. Jacob Hardin has persistent nausea and anorexia following FOLFIRI chemotherapy. His symptoms could be related to chemotherapy toxicity or progression of the pancreas cancer. He will receive intravenous fluids and Decadron today. He will monitor his blood sugar following Decadron therapy and administer additional insulin as needed.  Jacob Hardin will return for an office visit as scheduled on 03/11/2017. He will contact us if the nausea does not improve with Decadron.  [name], M.D.

## 2017-03-09 ENCOUNTER — Other Ambulatory Visit: Payer: Self-pay | Admitting: Oncology

## 2017-03-10 ENCOUNTER — Other Ambulatory Visit: Payer: Self-pay | Admitting: Radiology

## 2017-03-11 ENCOUNTER — Ambulatory Visit (HOSPITAL_BASED_OUTPATIENT_CLINIC_OR_DEPARTMENT_OTHER): Payer: Medicare Other | Admitting: Oncology

## 2017-03-11 ENCOUNTER — Telehealth: Payer: Self-pay | Admitting: Oncology

## 2017-03-11 ENCOUNTER — Other Ambulatory Visit (HOSPITAL_BASED_OUTPATIENT_CLINIC_OR_DEPARTMENT_OTHER): Payer: Medicare Other

## 2017-03-11 ENCOUNTER — Ambulatory Visit (HOSPITAL_BASED_OUTPATIENT_CLINIC_OR_DEPARTMENT_OTHER): Payer: Medicare Other

## 2017-03-11 VITALS — BP 124/65 | HR 66 | Temp 97.8°F | Resp 18 | Ht 72.0 in | Wt 140.7 lb

## 2017-03-11 DIAGNOSIS — E119 Type 2 diabetes mellitus without complications: Secondary | ICD-10-CM

## 2017-03-11 DIAGNOSIS — R748 Abnormal levels of other serum enzymes: Secondary | ICD-10-CM | POA: Diagnosis not present

## 2017-03-11 DIAGNOSIS — Z5111 Encounter for antineoplastic chemotherapy: Secondary | ICD-10-CM

## 2017-03-11 DIAGNOSIS — C250 Malignant neoplasm of head of pancreas: Secondary | ICD-10-CM

## 2017-03-11 DIAGNOSIS — R110 Nausea: Secondary | ICD-10-CM | POA: Diagnosis not present

## 2017-03-11 DIAGNOSIS — Z95828 Presence of other vascular implants and grafts: Secondary | ICD-10-CM

## 2017-03-11 LAB — COMPREHENSIVE METABOLIC PANEL
ALT: 163 U/L — AB (ref 0–55)
ANION GAP: 12 meq/L — AB (ref 3–11)
AST: 138 U/L — ABNORMAL HIGH (ref 5–34)
Albumin: 3.3 g/dL — ABNORMAL LOW (ref 3.5–5.0)
BUN: 13.8 mg/dL (ref 7.0–26.0)
CHLORIDE: 99 meq/L (ref 98–109)
CO2: 27 meq/L (ref 22–29)
CREATININE: 0.9 mg/dL (ref 0.7–1.3)
Calcium: 9.7 mg/dL (ref 8.4–10.4)
EGFR: 89 mL/min/{1.73_m2} — ABNORMAL LOW (ref >90)
Glucose: 294 mg/dl — ABNORMAL HIGH (ref 70–140)
POTASSIUM: 5.1 meq/L (ref 3.5–5.1)
Sodium: 138 mEq/L (ref 136–145)
Total Bilirubin: 1.09 mg/dL (ref 0.20–1.20)
Total Protein: 7.2 g/dL (ref 6.4–8.3)

## 2017-03-11 LAB — CBC WITH DIFFERENTIAL/PLATELET
BASO%: 0.7 % (ref 0.0–2.0)
BASOS ABS: 0.1 10*3/uL (ref 0.0–0.1)
EOS%: 3.3 % (ref 0.0–7.0)
Eosinophils Absolute: 0.2 10*3/uL (ref 0.0–0.5)
HEMATOCRIT: 34.1 % — AB (ref 38.4–49.9)
HEMOGLOBIN: 11.4 g/dL — AB (ref 13.0–17.1)
LYMPH#: 0.8 10*3/uL — AB (ref 0.9–3.3)
LYMPH%: 10.6 % — ABNORMAL LOW (ref 14.0–49.0)
MCH: 31.1 pg (ref 27.2–33.4)
MCHC: 33.3 g/dL (ref 32.0–36.0)
MCV: 93.3 fL (ref 79.3–98.0)
MONO#: 0.4 10*3/uL (ref 0.1–0.9)
MONO%: 5.9 % (ref 0.0–14.0)
NEUT#: 6 10*3/uL (ref 1.5–6.5)
NEUT%: 79.5 % — AB (ref 39.0–75.0)
Platelets: 158 10*3/uL (ref 140–400)
RBC: 3.66 10*6/uL — ABNORMAL LOW (ref 4.20–5.82)
RDW: 20 % — ABNORMAL HIGH (ref 11.0–14.6)
WBC: 7.6 10*3/uL (ref 4.0–10.3)

## 2017-03-11 MED ORDER — DEXAMETHASONE 4 MG PO TABS: 4.0000 mg | ORAL_TABLET | Freq: Two times a day (BID) | ORAL | 0 refills | Status: DC

## 2017-03-11 MED ORDER — DEXTROSE 5 % IV SOLN: Freq: Once | INTRAVENOUS | Status: DC

## 2017-03-11 MED ORDER — SODIUM CHLORIDE 0.9 % IV SOLN
400.0000 mg/m2 | Freq: Once | INTRAVENOUS | Status: AC
  Administered 2017-03-11: 744 mg via INTRAVENOUS
  Filled 2017-03-11: qty 37.2

## 2017-03-11 MED ORDER — SODIUM CHLORIDE 0.9 % IV SOLN
2400.0000 mg/m2 | INTRAVENOUS | Status: DC
  Administered 2017-03-11: 4450 mg via INTRAVENOUS
  Filled 2017-03-11: qty 89

## 2017-03-11 MED ORDER — PALONOSETRON HCL INJECTION 0.25 MG/5ML
INTRAVENOUS | Status: AC
  Filled 2017-03-11: qty 5

## 2017-03-11 MED ORDER — PALONOSETRON HCL INJECTION 0.25 MG/5ML
0.2500 mg | Freq: Once | INTRAVENOUS | Status: AC
  Administered 2017-03-11: 0.25 mg via INTRAVENOUS

## 2017-03-11 MED ORDER — SODIUM CHLORIDE 0.9 % IV SOLN
Freq: Once | INTRAVENOUS | Status: AC
  Administered 2017-03-11: 12:00:00 via INTRAVENOUS

## 2017-03-11 MED ORDER — SODIUM CHLORIDE 0.9 % IV SOLN
100.0000 mg/m2 | Freq: Once | INTRAVENOUS | Status: AC
  Administered 2017-03-11: 180 mg via INTRAVENOUS
  Filled 2017-03-11: qty 4

## 2017-03-11 MED ORDER — ATROPINE SULFATE 1 MG/ML IJ SOLN: 0.5000 mg | Freq: Once | INTRAMUSCULAR | Status: DC | PRN

## 2017-03-11 MED ORDER — SODIUM CHLORIDE 0.9 % IV SOLN
Freq: Once | INTRAVENOUS | Status: AC
  Administered 2017-03-11: 13:00:00 via INTRAVENOUS
  Filled 2017-03-11: qty 5

## 2017-03-11 NOTE — Telephone Encounter (Signed)
Scheduled appt per 5/21 los. -Gave patient AVS and calender

## 2017-03-11 NOTE — Progress Notes (Signed)
[facility] OFFICE PROGRESS NOTE   Diagnosis: Pancreas cancer  INTERVAL HISTORY:   Jacob Hardin returns as scheduled. He reports feeling much better after the intravenous fluids and Decadron last week. His blood sugar became elevated after taking Decadron. He was able to control the blood sugar with an insulin sliding scale. He denies pain and fever. He continues to have pruritus. He takes hydroxyzine in the evening. He was able to get out of the house over the weekend.  Objective:  Vital signs in last 24 hours:  Blood pressure 124/65, pulse 66, temperature 97.8 F (36.6 C), temperature source Oral, resp. rate 18, height 6' (1.829 m), weight 140 lb 11.2 oz (63.8 kg), SpO2 100 %.    HEENT: No thrush or ulcers Resp: Lungs clear bilaterally Cardio: Regular rate and rhythm GI: No hepatosplenomegaly, nontender, right upper quadrant biliary drain with a gauze dressing Vascular: No leg edema   Portacath/PICC-without erythema  Lab Results:  Lab Results  Component Value Date   WBC 7.6 03/11/2017   HGB 11.4 (L) 03/11/2017   HCT 34.1 (L) 03/11/2017   MCV 93.3 03/11/2017   PLT 158 03/11/2017   NEUTROABS 6.0 03/11/2017    CMP     Component Value Date/Time   NA 138 03/11/2017 0956   K 5.1 03/11/2017 0956   CL 100 (L) 02/16/2017 0429   CO2 27 03/11/2017 0956   GLUCOSE 294 (H) 03/11/2017 0956   BUN 13.8 03/11/2017 0956   CREATININE 0.9 03/11/2017 0956   CALCIUM 9.7 03/11/2017 0956   PROT 7.2 03/11/2017 0956   ALBUMIN 3.3 (L) 03/11/2017 0956   AST 138 (H) 03/11/2017 0956   ALT 163 (H) 03/11/2017 0956   ALKPHOS 1,295 (H) 03/11/2017 0956   BILITOT 1.09 03/11/2017 0956   GFRNONAA >60 02/16/2017 0429   GFRNONAA 79 11/03/2015 0839   GFRAA >60 02/16/2017 0429   GFRAA >89 11/03/2015 0839  CA 19-9 on 02/18/2017-2068   Medications: I have reviewed the patient's current medications.  Assessment/Plan: 1.Pancreas cancer, stage IV, pancreas head mass, status post an  EUS biopsy 11/18/2015 confirming adenocarcinoma  Upper endoscopy 11/18/2015 confirmed duodenal invasion/obstruction with a biopsy confirming adenocarcinoma  Placement of a duodenal stent 11/22/2015  MRI abdomen 11/15/2015 revealed a pancreas head mass and liver metastases  Cycle 1 FOLFIRINOX 12/06/2015  Cycle 2 FOLFIRINOX 12/21/2015  Cycle 3 FOLFIRINOX 01/04/2016  CT abdomen/pelvis 01/19/2016 showed improvement in the pancreatic head mass and liver metastases.  Cycle 4 FOLFIRINOX 02/01/2016  Cycle 5 FOLFIRINOX 02/15/2016  Cycle 6 FOLFIRINOX 02/29/2016  Cycle 7 FOLFIRINOX 03/14/2016  Cycle 8 FOLFIRINOX 03/28/2016  CT abdomen/pelvis 04/09/2016-improvement in pancreas head mass and liver metastases, no new metastatic disease  Chemotherapy switch to FOLFIRI every 3 weeks beginning 04/11/2016  CT 07/30/2016-improvement in liver metastases, stable pancreas mass   FOLFIRI continued on a 3 week schedule  FOLFIRI 09/19/2016.  Treatment subsequently placed on hold due to biliary obstruction  CT 10/13/2016-no change in liver metastases or pancreas mass  FOLFIRI resumed on a 3 week schedule 01/07/2017  FOLFIRI 01/28/2017  FOLFIRI 02/26/2017  FOLFIRI 03/11/2017 (irinotecan dose reduced secondary to prolonged nausea) disease  2. Diabetes  3. History of Anorexia/weight loss secondary to #1  4. Obstructive jaundice secondary to #1-status post placement of a percutaneous internal/external biliary drain 11/25/2015; biliary drainage catheter capped 12/02/2015; biliary drain internalized 12/13/2015  5. Port-A-Cath placement 11/30/2015 interventional radiology  6. Admission 01/17/2016 with a high fever and rigors-no apparent source for infection upon review of  his history and examination, blood cultures positive for gram-positive cocci--viridans streptococcus--completed 2 weeks of IV ceftriaxone; no endocarditis on TTE.  7. Oxaliplatin neuropathy  8.  Elevated transaminases, alkaline phosphatase, and bilirubin 06/13/2016.06/19/2016 bilirubin improved to normal range; liver enzymes remain elevated.  9. Generalized pruritus,no rash 10/10/2016.Likely secondary to hyperbilirubinemia. Resolved 11/12/2016. Recurrent 12/03/2016. Persistent pruritus.  10. Hospitalization 10/10/2016 through 10/20/2016 with biliary obstruction/sepsis. Status post placement of a biliary drain in interventional radiology 10/16/2016. Status post ERCP 10/26/2016 with biliary stent exchange. There was no bile drainage. Status post cholangiogram and exchange of biliary drain 11/07/2016.Biliary drain capped 11/12/2016.  Exchange of right sided percutaneous biliary drainage catheter 02/12/2017  11. 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 C. difficile colitis 04/26/2018status post course of vancomycin.  14. Delayed nausea following chemotherapy-irinotecan dose reduced and prophylactic Decadron added with chemotherapy 03/11/2017   Disposition:  Jacob Hardin has an improved performance status today. The plan is to proceed with another cycle of FOLFIRI. The elevated liver enzymes are likely related to toxicity from chemotherapy and biliary obstruction. I reviewed the lab studies with the [facility]. There is no indication for holding irinotecan. We dose reduce the irinotecan secondary to prolonged nausea following the last cycle of chemotherapy.  He will return for an office visit and the next cycle of chemotherapy in 3 weeks. He will take prophylactic Decadron for 3 days after this cycle. He will contact us for nausea. He will use an insulin sliding scale as needed.  25 minutes were spent with the patient today. The majority of the time was used for counseling and coordination of care.  Betsy Coder, MD  03/11/2017  10:54 AM

## 2017-03-11 NOTE — Addendum Note (Signed)
Addended by: Rosalio Macadamia C on: 03/11/2017 12:01 PM   Modules accepted: Orders

## 2017-03-11 NOTE — Patient Instructions (Signed)
[facility] Discharge Instructions for Patients Receiving Chemotherapy  Today you received the following chemotherapy agents Irinotecan/Leucovorin/5 FU To help prevent nausea and vomiting after your treatment, we encourage you to take your nausea medication as prescribed.   If you develop nausea and vomiting that is not controlled by your nausea medication, call the clinic.   BELOW ARE SYMPTOMS THAT SHOULD BE REPORTED IMMEDIATELY:  *FEVER GREATER THAN 100.5 F  *CHILLS WITH OR WITHOUT FEVER  NAUSEA AND VOMITING THAT IS NOT CONTROLLED WITH YOUR NAUSEA MEDICATION  *UNUSUAL SHORTNESS OF BREATH  *UNUSUAL BRUISING OR BLEEDING  TENDERNESS IN MOUTH AND THROAT WITH OR WITHOUT PRESENCE OF ULCERS  *URINARY PROBLEMS  *BOWEL PROBLEMS  UNUSUAL RASH Items with * indicate a potential emergency and should be followed up as soon as possible.  Feel free to call the clinic you have any questions or concerns. The clinic phone number is [phone].  Please show the [facility] at check-in to the Emergency Department and triage nurse.

## 2017-03-12 ENCOUNTER — Other Ambulatory Visit (HOSPITAL_COMMUNITY): Payer: Self-pay | Admitting: Interventional Radiology

## 2017-03-12 ENCOUNTER — Other Ambulatory Visit (HOSPITAL_COMMUNITY): Payer: Self-pay | Admitting: Student

## 2017-03-12 ENCOUNTER — Ambulatory Visit (HOSPITAL_COMMUNITY)
Admission: RE | Admit: 2017-03-12 | Discharge: 2017-03-12 | Disposition: A | Discharge status: Home / Self Care | Payer: Medicare Other | Source: Ambulatory Visit | Attending: Student | Admitting: Student

## 2017-03-12 ENCOUNTER — Encounter (HOSPITAL_COMMUNITY): Payer: Self-pay | Admitting: Interventional Radiology

## 2017-03-12 DIAGNOSIS — K831 Obstruction of bile duct: Secondary | ICD-10-CM

## 2017-03-12 DIAGNOSIS — K7290 Hepatic failure, unspecified without coma: Secondary | ICD-10-CM | POA: Diagnosis not present

## 2017-03-12 DIAGNOSIS — C801 Malignant (primary) neoplasm, unspecified: Secondary | ICD-10-CM | POA: Insufficient documentation

## 2017-03-12 DIAGNOSIS — Z4682 Encounter for fitting and adjustment of non-vascular catheter: Secondary | ICD-10-CM | POA: Diagnosis not present

## 2017-03-12 HISTORY — PX: IR EXCHANGE BILIARY DRAIN: IMG6046

## 2017-03-12 LAB — CANCER ANTIGEN 19-9: CA 19-9: 4241 U/mL — ABNORMAL HIGH (ref 0–35)

## 2017-03-12 MED ORDER — IOPAMIDOL (ISOVUE-300) INJECTION 61%
25.0000 mL | Freq: Once | INTRAVENOUS | Status: AC | PRN
  Administered 2017-03-12: 25 mL

## 2017-03-13 ENCOUNTER — Other Ambulatory Visit: Payer: Self-pay | Admitting: Nurse Practitioner

## 2017-03-13 ENCOUNTER — Ambulatory Visit (HOSPITAL_BASED_OUTPATIENT_CLINIC_OR_DEPARTMENT_OTHER): Payer: Medicare Other

## 2017-03-13 ENCOUNTER — Telehealth: Payer: Self-pay | Admitting: Oncology

## 2017-03-13 VITALS — BP 128/70 | HR 68 | Temp 97.8°F | Resp 16

## 2017-03-13 DIAGNOSIS — Z452 Encounter for adjustment and management of vascular access device: Secondary | ICD-10-CM

## 2017-03-13 DIAGNOSIS — C250 Malignant neoplasm of head of pancreas: Secondary | ICD-10-CM

## 2017-03-13 MED ORDER — HEPARIN SOD (PORK) LOCK FLUSH 100 UNIT/ML IV SOLN
500.0000 [IU] | Freq: Once | INTRAVENOUS | Status: AC | PRN
  Administered 2017-03-13: 500 [IU]
  Filled 2017-03-13: qty 5

## 2017-03-13 MED ORDER — SODIUM CHLORIDE 0.9% FLUSH
10.0000 mL | INTRAVENOUS | Status: DC | PRN
  Administered 2017-03-13: 10 mL
  Filled 2017-03-13: qty 10

## 2017-03-13 NOTE — Telephone Encounter (Signed)
No los per 03/05/17 visit.

## 2017-03-15 ENCOUNTER — Telehealth: Payer: Self-pay | Admitting: *Deleted

## 2017-03-15 ENCOUNTER — Telehealth: Payer: Self-pay

## 2017-03-15 NOTE — Telephone Encounter (Signed)
Last night BS was 600, he had not eaten very much. Insulin brought it down to 300. Weak, couldn't make it back from bathroom. Threw up after orange juice, during the night.  Now he is weak, claustrophobic, and has crick in neck and back. BS this am 372, is taking extra fast acting insulin about 10 minutes ago. Not eating much, is drinking 2-3 cups this morning milk and water. No fever 98.8, no nausea this morning, no coughing.  Wednesday changed biliary drain to shorter drain. It is clamped at present.  No diarrhea.  Monday 5/21 folfiri cycle 20 with no problems

## 2017-03-15 NOTE — Telephone Encounter (Signed)
Call placed back to patient and pt.'s wife states that pt is "feeling much better" and is drinking one cup of water every 30 minutes without difficulty.  Instructed patient's wife to have pt push fluids, to continue to monitor blood sugar and that pt can come in today or tomorrow for IVF's per order of Dr. [name].  Patient denies need for IVF's at this time and states that he will continue to push fluids and call [facility] back if blood sugar does not decrease in the next few hours.  Patient's wife appreciative of call back and has no questions at this time.

## 2017-03-19 ENCOUNTER — Other Ambulatory Visit: Payer: Self-pay | Admitting: Radiology

## 2017-03-20 ENCOUNTER — Encounter (HOSPITAL_COMMUNITY): Payer: Self-pay

## 2017-03-20 ENCOUNTER — Ambulatory Visit (HOSPITAL_COMMUNITY)
Admission: RE | Admit: 2017-03-20 | Discharge: 2017-03-20 | Disposition: A | Discharge status: Home / Self Care | Payer: Medicare Other | Source: Ambulatory Visit | Attending: Interventional Radiology | Admitting: Interventional Radiology

## 2017-03-20 ENCOUNTER — Other Ambulatory Visit (HOSPITAL_COMMUNITY): Payer: Self-pay | Admitting: Interventional Radiology

## 2017-03-20 ENCOUNTER — Other Ambulatory Visit: Payer: Self-pay | Admitting: Student

## 2017-03-20 DIAGNOSIS — C787 Secondary malignant neoplasm of liver and intrahepatic bile duct: Secondary | ICD-10-CM | POA: Diagnosis not present

## 2017-03-20 DIAGNOSIS — K831 Obstruction of bile duct: Secondary | ICD-10-CM

## 2017-03-20 DIAGNOSIS — I10 Essential (primary) hypertension: Secondary | ICD-10-CM | POA: Diagnosis not present

## 2017-03-20 DIAGNOSIS — Z85828 Personal history of other malignant neoplasm of skin: Secondary | ICD-10-CM | POA: Diagnosis not present

## 2017-03-20 DIAGNOSIS — C259 Malignant neoplasm of pancreas, unspecified: Secondary | ICD-10-CM | POA: Insufficient documentation

## 2017-03-20 DIAGNOSIS — Z79899 Other long term (current) drug therapy: Secondary | ICD-10-CM | POA: Diagnosis not present

## 2017-03-20 DIAGNOSIS — Z794 Long term (current) use of insulin: Secondary | ICD-10-CM | POA: Insufficient documentation

## 2017-03-20 DIAGNOSIS — E119 Type 2 diabetes mellitus without complications: Secondary | ICD-10-CM | POA: Insufficient documentation

## 2017-03-20 HISTORY — PX: IR CONVERT BILIARY DRAIN TO INT EXT BILIARY DRAIN: IMG6045

## 2017-03-20 LAB — COMPREHENSIVE METABOLIC PANEL
ALT: 47 U/L (ref 17–63)
AST: 33 U/L (ref 15–41)
Albumin: 2.5 g/dL — ABNORMAL LOW (ref 3.5–5.0)
Alkaline Phosphatase: 560 U/L — ABNORMAL HIGH (ref 38–126)
Anion gap: 8 (ref 5–15)
BILIRUBIN TOTAL: 1.6 mg/dL — AB (ref 0.3–1.2)
BUN: 15 mg/dL (ref 6–20)
CO2: 26 mmol/L (ref 22–32)
CREATININE: 0.72 mg/dL (ref 0.61–1.24)
Calcium: 8.5 mg/dL — ABNORMAL LOW (ref 8.9–10.3)
Chloride: 97 mmol/L — ABNORMAL LOW (ref 101–111)
GFR calc Af Amer: >60 mL/min (ref >60)
Glucose, Bld: 170 mg/dL — ABNORMAL HIGH (ref 65–99)
Potassium: 3.4 mmol/L — ABNORMAL LOW (ref 3.5–5.1)
Sodium: 131 mmol/L — ABNORMAL LOW (ref 135–145)
TOTAL PROTEIN: 6.2 g/dL — AB (ref 6.5–8.1)

## 2017-03-20 LAB — CBC
HCT: 24.8 % — ABNORMAL LOW (ref 39.0–52.0)
Hemoglobin: 8.5 g/dL — ABNORMAL LOW (ref 13.0–17.0)
MCH: 30.6 pg (ref 26.0–34.0)
MCHC: 34.3 g/dL (ref 30.0–36.0)
MCV: 89.2 fL (ref 78.0–100.0)
PLATELETS: 137 10*3/uL — AB (ref 150–400)
RBC: 2.78 MIL/uL — AB (ref 4.22–5.81)
RDW: 16.8 % — AB (ref 11.5–15.5)
WBC: 8 10*3/uL (ref 4.0–10.5)

## 2017-03-20 LAB — APTT: APTT: 33 s (ref 24–36)

## 2017-03-20 LAB — PROTIME-INR
INR: 1.14
PROTHROMBIN TIME: 14.6 s (ref 11.4–15.2)

## 2017-03-20 LAB — GLUCOSE, CAPILLARY: GLUCOSE-CAPILLARY: 164 mg/dL — AB (ref 65–99)

## 2017-03-20 MED ORDER — MIDAZOLAM HCL 2 MG/2ML IJ SOLN
INTRAMUSCULAR | Status: AC | PRN
Start: 2017-03-20 — End: 2017-03-20
  Administered 2017-03-20 (×2): 1 mg via INTRAVENOUS

## 2017-03-20 MED ORDER — PIPERACILLIN-TAZOBACTAM 3.375 G IVPB 30 MIN
3.3750 g | INTRAVENOUS | Status: AC
  Administered 2017-03-20: 3.375 g via INTRAVENOUS
  Filled 2017-03-20: qty 50

## 2017-03-20 MED ORDER — LIDOCAINE HCL 1 % IJ SOLN
INTRAMUSCULAR | Status: AC
  Filled 2017-03-20: qty 20

## 2017-03-20 MED ORDER — IOPAMIDOL (ISOVUE-300) INJECTION 61%
INTRAVENOUS | Status: AC
  Administered 2017-03-20: 15 mL
  Filled 2017-03-20: qty 50

## 2017-03-20 MED ORDER — PROMETHAZINE HCL 25 MG/ML IJ SOLN: 12.5000 mg | Freq: Once | INTRAMUSCULAR | Status: DC

## 2017-03-20 MED ORDER — MIDAZOLAM HCL 2 MG/2ML IJ SOLN
INTRAMUSCULAR | Status: AC
  Filled 2017-03-20: qty 6

## 2017-03-20 MED ORDER — IOPAMIDOL (ISOVUE-300) INJECTION 61%
15.0000 mL | Freq: Once | INTRAVENOUS | Status: AC | PRN
  Administered 2017-03-20: 15 mL

## 2017-03-20 MED ORDER — PIPERACILLIN-TAZOBACTAM 3.375 G IVPB
INTRAVENOUS | Status: AC
  Filled 2017-03-20: qty 50

## 2017-03-20 MED ORDER — HEPARIN SOD (PORK) LOCK FLUSH 100 UNIT/ML IV SOLN
500.0000 [IU] | INTRAVENOUS | Status: AC | PRN
  Administered 2017-03-20: 500 [IU]
  Filled 2017-03-20: qty 5

## 2017-03-20 MED ORDER — SODIUM CHLORIDE 0.9 % IV SOLN
INTRAVENOUS | Status: DC
  Administered 2017-03-20: 14:00:00 via INTRAVENOUS

## 2017-03-20 MED ORDER — LIDOCAINE HCL 1 % IJ SOLN
INTRAMUSCULAR | Status: AC | PRN
  Administered 2017-03-20: 5 mL

## 2017-03-20 MED ORDER — FENTANYL CITRATE (PF) 100 MCG/2ML IJ SOLN
INTRAMUSCULAR | Status: AC
  Filled 2017-03-20: qty 4

## 2017-03-20 MED ORDER — FENTANYL CITRATE (PF) 100 MCG/2ML IJ SOLN
INTRAMUSCULAR | Status: AC | PRN
  Administered 2017-03-20: 50 ug via INTRAVENOUS

## 2017-03-20 NOTE — H&P (Signed)
Chief Complaint: Patient was seen in consultation today for metastatic pancreatic cancer  Referring Physician(s):  Dr. Carol Ada  Supervising Physician: Arne Cleveland  Patient Status: [facility] - Out-pt  History of Present Illness: Jacob Hardin is a 71 y.o. male with history of metastatic pancreatic cancer. Patient is well known to IR service due to ongoing management of obstructive jaundice related to his metastatic disease.  Patient first had biliary drain catheter placed 11/25/15 with subsequent placement of an internal stent 12/13/15.  The stent occluded in December 2017 requiring biliary drain catheter replacement 10/16/16.  Patient then had endoscopic placement of an internal biliary stent on 10/26/16 alongside the existing biliary drain which was exchanged 11/07/16, then capped 11/12/16.   Patient did well for a few weeks prior to admission for concern for sepsis on 02/09/17.  His biliary drain was exchanged 02/12/17.  Patient drain was last exchanged 03/12/17 and was placed cranial to his biliary stent. Patient then started 1 week capping trial.   Unfortunately, patient has not tolerated his capping trial this week.  He had significant pain and nausea the first day.  He placed to gravity and had significant output for 2 days. He complains of general weakness and chills since last night. He presents today for internal/external biliary drain placement.   He has been NPO.  He does not take blood thinners.   Past Medical History:  Diagnosis Date  . Elevated liver enzymes   . GERD (gastroesophageal reflux disease)   . Hyperlipidemia   . Hypertension    recently weight lost-no meds now-running low.  . Obstructive jaundice   . Oxalate nephropathy   . PONV (postoperative nausea and vomiting)   . Primary pancreatic adenocarcinoma (HCC)    mets to liver and duodenum  . Pruritus   . Restless legs   . Skin cancer    "burned off my face & head"  . Type II diabetes mellitus ([place])   .  Weight loss     Past Surgical History:  Procedure Laterality Date  . ANTERIOR CERVICAL DECOMP/DISCECTOMY FUSION  1994   cervical-bone graft  . BACK SURGERY    . COLONOSCOPY    . DUODENAL STENT PLACEMENT N/A 11/22/2015   Procedure: DUODENAL STENT PLACEMENT;  Surgeon: Carol Ada, MD;  Location: [place];  Service: Endoscopy;  Laterality: N/A;  . ERCP N/A 10/26/2016   Procedure: ENDOSCOPIC RETROGRADE CHOLANGIOPANCREATOGRAPHY (ERCP);  Surgeon: Carol Ada, MD;  Location: [facility] ENDOSCOPY;  Service: Endoscopy;  Laterality: N/A;  . ESOPHAGOGASTRODUODENOSCOPY N/A 11/18/2015   Procedure: ESOPHAGOGASTRODUODENOSCOPY (EGD);  Surgeon: Carol Ada, MD;  Location: [facility] ENDOSCOPY;  Service: Endoscopy;  Laterality: N/A;  . ESOPHAGOGASTRODUODENOSCOPY N/A 11/22/2015   Procedure: ESOPHAGOGASTRODUODENOSCOPY (EGD);  Surgeon: Carol Ada, MD;  Location: [facility] ENDOSCOPY;  Service: Endoscopy;  Laterality: N/A;  Uncovered duodenal stent placement.  Fluoroscopy required.  . ESOPHAGOGASTRODUODENOSCOPY N/A 10/14/2016   Procedure: ESOPHAGOGASTRODUODENOSCOPY (EGD);  Surgeon: Carol Ada, MD;  Location: [facility] ENDOSCOPY;  Service: Endoscopy;  Laterality: N/A;  . EUS  11/18/2015   Procedure: UPPER ENDOSCOPIC ULTRASOUND (EUS) LINEAR;  Surgeon: Carol Ada, MD;  Location: [place];  Service: Endoscopy;;  . INGUINAL HERNIA REPAIR Left   . IR EXCHANGE BILIARY DRAIN  02/12/2017  . IR EXCHANGE BILIARY DRAIN  03/12/2017  . IR GENERIC HISTORICAL  10/16/2016   IR BILIARY DRAIN PLACEMENT WITH CHOLANGIOGRAM 10/16/2016 Marybelle Killings, MD [facility]-INTERV RAD  . IR GENERIC HISTORICAL  11/07/2016   IR EXCHANGE BILIARY DRAIN 11/07/2016 Arne Cleveland, MD [facility]-INTERV  RAD  . IR GENERIC HISTORICAL  11/12/2016   IR PATIENT EVAL TECH 0-60 MINS 11/12/2016 Arne Cleveland, MD [facility]-INTER[facility] RAD  . SHOULDER ARTHROSCOPY W/ ROTATOR CUFF REPAIR Left 2014  . TONSILLECTOMY    . WISDOM TOOTH EXTRACTION      Allergies: Codeine  Medications: Prior to Admission  medications   Medication Sig Start Date End Date Taking? Authorizing Provider  acetaminophen (TYLENOL) 500 MG tablet Take 1,000 mg by mouth every 6 (six) hours as needed.    [provider]  baclofen (LIORESAL) 10 MG tablet Take 5 mg by mouth 3 (three) times daily as needed (for hiccups).    [provider]  BD PEN NEEDLE NANO U/F 32G X 4 MM MISC USE TO INJECT INSULIN TWICE A DAY 11/06/16   [name], MD  dexamethasone (DECADRON) 4 MG tablet Take 1 tablet (4 mg total) by mouth 2 (two) times daily. For 3 days. Begin 5/22. 03/11/17   Ladell Pier, MD  hydrOXYzine (ATARAX/VISTARIL) 25 MG tablet Take 1 tablet (25 mg total) by mouth every 8 (eight) hours as needed for itching. 12/06/16   Ladell Pier, MD  insulin aspart (NOVOLOG FLEXPEN) 100 UNIT/ML FlexPen Inject 4 Units into the skin daily before supper.    [provider]  Insulin Detemir (LEVEMIR FLEXTOUCH) 100 UNIT/ML Pen Inject 40 Units into the skin daily.    [provider]  lidocaine-prilocaine (EMLA) cream Apply 1 application topically as needed (prior to accessing port).    [provider]  lipase/protease/amylase (CREON) 36000 UNITS CPEP capsule Take 1 capsule (36,000 Units total) by mouth 3 (three) times daily before meals. 02/22/17   Ladell Pier, MD  loratadine (CLARITIN) 10 MG tablet Take 10 mg by mouth daily.     [provider]  Multiple Vitamin (MULTIVITAMIN WITH MINERALS) TABS tablet Take 1 tablet by mouth daily.    [provider]  ondansetron (ZOFRAN) 8 MG tablet Take 1 tablet (8 mg total) by mouth every 8 (eight) hours as needed for nausea or vomiting. 03/05/17   Owens Shark, NP  polyethylene glycol (MIRALAX / GLYCOLAX) packet Take 17 g by mouth daily as needed for mild constipation.     [provider]  prochlorperazine (COMPAZINE) 10 MG tablet Take 1 tablet (10 mg total) by mouth every 6 (six) hours as needed for nausea or vomiting. 03/05/17    Owens Shark, NP  traMADol (ULTRAM) 50 MG tablet Take 1 tablet (50 mg total) by mouth every 6 (six) hours as needed. 02/15/17   Theodis Blaze, MD     Family History  Problem Relation Age of Onset  . Diabetes Mother   . Hypertension Mother   . Cancer Father        lung 68  . Hypertension Father   . Cancer Brother        pancreatic   . Diabetes Brother   . Cancer Paternal Grandfather        colon  . Cancer Paternal Uncle        prostate    Social History   Social History  . Marital status: Married    Spouse name: Butch Penny  . Number of children: 0  . Years of education: N/A   Occupational History  . retired     Pharmacist, hospital   Social History Main Topics  . Smoking status: Never Smoker  . Smokeless tobacco: Never Used  . Alcohol use No  . Drug use:  No  . Sexual activity: Yes    Birth control/ protection: None     Comment: married to Towner.  Retired Corporate investment banker   Other Topics Concern  . None   Social History Narrative   Married, wife Butch Penny   No children   Retired Art therapist    Review of Systems  Constitutional: Negative for fatigue and fever.  Respiratory: Negative for cough and shortness of breath.   Cardiovascular: Negative for chest pain.  Gastrointestinal: Negative for abdominal pain and nausea.  Skin: Negative for color change.  Psychiatric/Behavioral: Negative for behavioral problems and confusion.    Vital Signs: BP 134/74 (BP Location: Right Arm)   Pulse 81   Temp 100 F (37.8 C) (Oral)   Resp 18   Ht 6' (1.829 m)   Wt 139 lb 3.2 oz (63.1 kg)   SpO2 99%   BMI 18.88 kg/m   Physical Exam  Constitutional: He is oriented to person, place, and time. He appears well-developed.  Cardiovascular: Normal rate, regular rhythm and normal heart sounds.   Pulmonary/Chest: Effort normal and breath sounds normal. No respiratory distress.  Abdominal: Soft.  Existing external drain in place, capped.   Neurological: He is alert and  oriented to person, place, and time.  Skin: Skin is warm and dry.  Psychiatric: He has a normal mood and affect. His behavior is normal. Judgment and thought content normal.  Nursing note and vitals reviewed.   Mallampati Score:  MD Evaluation Airway: WNL Heart: WNL Abdomen: WNL Chest/ Lungs: WNL ASA  Classification: 3 Mallampati/Airway Score: Two  Imaging: Ir Exchange Biliary Drain  Result Date: 03/12/2017 INDICATION: 71 year old male with a history of pancreatic adenocarcinoma and malignant obstructed jaundice. He currently has a duodenum and biliary stent in place with an additional internal/external biliary drainage catheter which has been capped. The drainage catheter passes through the common bile duct adjacent to the biliary drain. He presents today for cholangiogram and conversion to an external drainage catheter positioned cranial to the stent for initiation of a capped trial. EXAM: Biliary drain exchange under fluoroscopic guidance MEDICATIONS: None ANESTHESIA/SEDATION: None FLUOROSCOPY TIME:  Fluoroscopy Time: 4 minutes 6 seconds (34 mGy). COMPLICATIONS: None immediate. PROCEDURE: Informed written consent was obtained from the patient after a thorough discussion of the procedural risks, benefits and alternatives. All questions were addressed. Maximal Sterile Barrier Technique was utilized including caps, mask, sterile gowns, sterile gloves, sterile drape, hand hygiene and skin antiseptic. A timeout was performed prior to the initiation of the procedure. The existing internal/ external biliary drainage catheter was cut and removed over a Bentson wire. A 9 French sheath was advanced over the wire and positioned in the common hepatic duct just cranial to the biliary stent. Contrast injection was performed. The biliary stent is widely patent. Contrast material in the duodenum refluxes into the duodenum stent before progressing antegrade. Filling of the common hepatic and central hepatic  ducts demonstrates no definitive stenosis or biliary ductal dilatation. A new 12 French Dawson Mueller drainage catheter was then advanced over the Bentson wire and formed in the common hepatic duct cranial to the biliary stent. This was confirmed by a gentle hand injection of contrast and a saved image. The catheter was secured to the skin with an adhesive fixation device. IMPRESSION: 1. Initial cholangiogram demonstrates a widely patent biliary stent with prompt drainage of injected contrast material and no evidence of more proximal biliary ductal dilatation or stenosis. 2. Exchange for a 12  Leonides Schanz external drainage catheter which was formed in the common hepatic duct cranial to the biliary stent. 3. The tube was left capped to initiate a capped trial. PLAN: 1. Patient to return Interventional Radiology in 1 week with complete metabolic panel to be drawn at the time of visit. If he has tolerated the 1 week capped trial and shows no evidence of increasing bilirubin, the external drain can be removed. 2. The patient and his wife or instructed to connect his drainage catheter to bag drainage if he develops recurrent symptoms of cholangitis. Signed, Criselda Peaches, MD [facility] Radiology Electronically Signed   By: Jacqulynn Cadet M.D.   On: 03/12/2017 12:34    Labs:  CBC:  Recent Labs  02/16/17 0429 02/18/17 1009 02/26/17 1001 03/11/17 0956  WBC 8.8 11.8* 7.4 7.6  HGB 9.2* 10.2* 9.1* 11.4*  HCT 27.1* 30.9* 27.3* 34.1*  PLT 272 314 208 158    COAGS:  Recent Labs  10/12/16 2021 11/07/16 0945 02/09/17 0751  INR 0.98 1.01 1.01  APTT 30  --   --     BMP:  Recent Labs  02/13/17 0539 02/14/17 0547 02/15/17 0625 02/16/17 0429 02/18/17 1009 02/26/17 1001 03/11/17 0956  NA 135 135 139 133* 135* 135* 138  K 3.3* 3.7 3.6 4.0 4.4 4.1 5.1  CL 103 101 102 100*  --   --   --   CO2 23 28 28 28 26 26 27   GLUCOSE 150*  181* 118* 208* 273* 283* 294*  BUN 11 10 9 9  13.9 15.2 13.8  CALCIUM 8.0* 8.3* 8.5* 8.2* 9.1 9.0 9.7  CREATININE 0.67 0.72 0.66 0.69 0.8 0.9 0.9  GFRNONAA >60 >60 >60 >60  --   --   --   GFRAA >60 >60 >60 >60  --   --   --     LIVER FUNCTION TESTS:  Recent Labs  02/15/17 0625 02/18/17 1009 02/26/17 1001 03/11/17 0956  BILITOT 0.9 1.57* 1.39* 1.09  AST 56* 116* 76* 138*  ALT 55 93* 75* 163*  ALKPHOS 979* 1,620* 1,211* 1,295*  PROT 6.0* 6.8 6.9 7.2  ALBUMIN 2.1* 2.6* 2.9* 3.3*    TUMOR MARKERS: No results for input(s): AFPTM, CEA, CA199, CHROMGRNA in the last 8760 hours.  Assessment and Plan: Patient with metastatic pancreatic cancer, biliary obstruction with current external biliary drain presents for replacement of an internal/external drain due to inability to tolerate capping trial. Patient presents for procedure today. He does have a slightly elevated temperature at 100F.  Awaiting pre-procedure lab results. He has been NPO.  He does not take blood thinners.  Risks and benefits discussed with the patient including, but not limited to bleeding, infection which may lead to sepsis or even death and damage to adjacent structures. All of the patient's questions were answered, patient is agreeable to proceed. Consent signed and in chart.  Thank you for this interesting consult.  I greatly enjoyed meeting Jacob Hardin and look forward to participating in their care.  A copy of this report was sent to the requesting provider on this date.  Electronically Signed: Docia Barrier, PA 03/20/2017, 2:00 PM   I spent a total of    15 Minutes in face to face in clinical consultation, greater than 50% of which was counseling/coordinating care for metastatic pancreatic cancer.

## 2017-03-20 NOTE — Procedures (Signed)
Exchange of ext bili drain for int/ext bili drain  No complication No blood loss. See complete dictation in [facility].

## 2017-03-20 NOTE — Sedation Documentation (Signed)
Just making sure you are paying attention to your documentation. =) XO

## 2017-03-20 NOTE — Discharge Instructions (Signed)
Biliary Drainage Catheter Placement, Care After This sheet gives you information about how to care for yourself after your procedure. Your health care provider may also give you more specific instructions. If you have problems or questions, contact your health care provider. What can I expect after the procedure? After the procedure, it is common to have:  Pain or soreness at the catheter insertion site.  Tiredness and sleepiness for several hours.  Some bruising at the catheter insertion site.  Drainage into the collection bag on the outside of your body, if you have an external drainage catheter.  You might see bloody discharge in the bag for the first 1 or 2 days.  Then, the discharge should turn a yellow-green color. Follow these instructions at home: Medicines   Take over-the-counter and prescription medicines for pain, discomfort, or fever only as told by your health care provider.  Do not take aspirin or blood thinners unless your health care provider says that you can. These can make bleeding worse.  Do not drive or use heavy machinery while taking prescription pain medicine. Catheter insertion site care   Clean the catheter insertion site as told by your health care provider.  Do not take baths, swim, or use a hot tub until your health care provider approves.  Take showers only. Before showering, cover the catheter insertion area with a watertight covering to keep the area dry.  Keep the skin around the catheter insertion site dry. If the area gets wet, dry the skin completely.  Check your catheter insertion site every day for signs of infection. Check for:  Redness, swelling, or pain.  Fluid or blood.  Warmth.  Pus or a bad smell.  General instructions   Rest for the remainder of the day.  Do not drive, use machinery, or make legal decisions for 24 hours after your procedure.  Resume your usual diet. Avoid alcoholic beverages for 24 hours after your  procedure.  Keep all follow-up visits as told by your health care provider. This is important.  Drink enough fluid to keep your urine clear or pale yellow. Contact a health care provider if:  Your pain gets worse after it had improved, and it is not relieved with pain medicines.  You have any questions about caring for your drainage catheter or collection bag.  You have any of these around your catheter insertion site or coming from it:  Skin breakdown.  Redness, swelling, or pain.  Fluid or blood.  Warmth to the touch.  Pus or a bad smell. Get help right away if:  You have a fever or chills.  Your redness, swelling, or pain at the catheter insertion site gets worse, even though you are cleaning it well.  You have leakage of bile around the drainage catheter.  Your drainage catheter becomes blocked or clogged.  Your drainage catheter comes out. This information is not intended to replace advice given to you by your health care provider. Make sure you discuss any questions you have with your health care provider. Document Released: 05/22/2004 Document Revised: 08/27/2016 Document Reviewed: 08/27/2016 Elsevier Interactive Patient Education  2017 [employer].   Moderate Conscious Sedation, Adult, Care After These instructions provide you with information about caring for yourself after your procedure. Your health care provider may also give you more specific instructions. Your treatment has been planned according to current medical practices, but problems sometimes occur. Call your health care provider if you have any problems or questions after your procedure. What  can I expect after the procedure? After your procedure, it is common:  To feel sleepy for several hours.  To feel clumsy and have poor balance for several hours.  To have poor judgment for several hours.  To vomit if you eat too soon. Follow these instructions at home: For at least 24 hours after the  procedure:    Do not:  Participate in activities where you could fall or become injured.  Drive.  Use heavy machinery.  Drink alcohol.  Take sleeping pills or medicines that cause drowsiness.  Make important decisions or sign legal documents.  Take care of children on your own.  Rest. Eating and drinking   Follow the diet recommended by your health care provider.  If you vomit:  Drink water, juice, or soup when you can drink without vomiting.  Make sure you have little or no nausea before eating solid foods. General instructions   Have a responsible adult stay with you until you are awake and alert.  Take over-the-counter and prescription medicines only as told by your health care provider.  If you smoke, do not smoke without supervision.  Keep all follow-up visits as told by your health care provider. This is important. Contact a health care provider if:  You keep feeling nauseous or you keep vomiting.  You feel light-headed.  You develop a rash.  You have a fever. Get help right away if:  You have trouble breathing. This information is not intended to replace advice given to you by your health care provider. Make sure you discuss any questions you have with your health care provider. Document Released: 07/29/2013 Document Revised: 03/12/2016 Document Reviewed: 01/28/2016 Elsevier Interactive Patient Education  2017 [employer].

## 2017-03-20 NOTE — Progress Notes (Signed)
Post procedure orders for flushing biliary tube TID with 5 ml of NS. Called Dr Vernard Gambles to clarify as wife stated she thought if biliary tube was to drainage, she did not have to flush it. Dr Vernard Gambles states this is correct. She will not have to flush biliary tube if it is connected to drainage bag.

## 2017-03-21 ENCOUNTER — Other Ambulatory Visit: Payer: Self-pay | Admitting: Family Medicine

## 2017-03-21 MED ORDER — INSULIN DETEMIR 100 UNIT/ML FLEXPEN: 40.0000 [IU] | PEN_INJECTOR | Freq: Every day | SUBCUTANEOUS | 3 refills | Status: DC

## 2017-03-22 NOTE — Addendum Note (Signed)
Addendum  created 03/22/17 5369 by Rica Koyanagi, MD   Sign clinical note

## 2017-03-22 NOTE — Anesthesia Postprocedure Evaluation (Signed)
Anesthesia Post Note  Patient: Jacob Hardin  Procedure(s) Performed: Procedure(s) (LRB): ENDOSCOPIC RETROGRADE CHOLANGIOPANCREATOGRAPHY (ERCP) (N/A)     Anesthesia Post Evaluation  Last Vitals:  Vitals:   10/26/16 1610 10/26/16 1620  BP: (!) 185/83 (!) 195/84  Pulse:    Resp:    Temp:      Last Pain:  Vitals:   10/26/16 1524  TempSrc: Oral                 Zariyah Stephens,JAMES TERRILL

## 2017-03-25 ENCOUNTER — Encounter: Payer: Medicare Other | Admitting: Nutrition

## 2017-03-25 ENCOUNTER — Ambulatory Visit: Payer: Medicare Other | Admitting: Oncology

## 2017-03-25 ENCOUNTER — Other Ambulatory Visit: Payer: Medicare Other

## 2017-03-25 ENCOUNTER — Ambulatory Visit: Payer: Medicare Other

## 2017-03-31 ENCOUNTER — Other Ambulatory Visit: Payer: Self-pay | Admitting: Oncology

## 2017-04-01 ENCOUNTER — Ambulatory Visit (HOSPITAL_BASED_OUTPATIENT_CLINIC_OR_DEPARTMENT_OTHER): Payer: Medicare Other

## 2017-04-01 ENCOUNTER — Telehealth: Payer: Self-pay | Admitting: Nurse Practitioner

## 2017-04-01 ENCOUNTER — Ambulatory Visit (HOSPITAL_BASED_OUTPATIENT_CLINIC_OR_DEPARTMENT_OTHER): Payer: Medicare Other | Admitting: Nurse Practitioner

## 2017-04-01 VITALS — BP 132/80 | HR 66 | Temp 97.8°F | Resp 18 | Ht 72.0 in | Wt 149.4 lb

## 2017-04-01 DIAGNOSIS — C250 Malignant neoplasm of head of pancreas: Secondary | ICD-10-CM

## 2017-04-01 DIAGNOSIS — Z5111 Encounter for antineoplastic chemotherapy: Secondary | ICD-10-CM

## 2017-04-01 DIAGNOSIS — C787 Secondary malignant neoplasm of liver and intrahepatic bile duct: Secondary | ICD-10-CM

## 2017-04-01 DIAGNOSIS — G620 Drug-induced polyneuropathy: Secondary | ICD-10-CM

## 2017-04-01 DIAGNOSIS — Z452 Encounter for adjustment and management of vascular access device: Secondary | ICD-10-CM | POA: Diagnosis not present

## 2017-04-01 DIAGNOSIS — E119 Type 2 diabetes mellitus without complications: Secondary | ICD-10-CM

## 2017-04-01 DIAGNOSIS — Z95828 Presence of other vascular implants and grafts: Secondary | ICD-10-CM

## 2017-04-01 LAB — COMPREHENSIVE METABOLIC PANEL
ALT: 53 U/L (ref 0–55)
ANION GAP: 9 meq/L (ref 3–11)
AST: 65 U/L — ABNORMAL HIGH (ref 5–34)
Albumin: 2.2 g/dL — ABNORMAL LOW (ref 3.5–5.0)
Alkaline Phosphatase: 1313 U/L — ABNORMAL HIGH (ref 40–150)
BUN: 11.6 mg/dL (ref 7.0–26.0)
CO2: 25 mEq/L (ref 22–29)
Calcium: 8.6 mg/dL (ref 8.4–10.4)
Chloride: 104 mEq/L (ref 98–109)
Creatinine: 0.7 mg/dL (ref 0.7–1.3)
Glucose: 258 mg/dl — ABNORMAL HIGH (ref 70–140)
Potassium: 3.9 mEq/L (ref 3.5–5.1)
Sodium: 137 mEq/L (ref 136–145)
TOTAL PROTEIN: 5.8 g/dL — AB (ref 6.4–8.3)
Total Bilirubin: 0.83 mg/dL (ref 0.20–1.20)

## 2017-04-01 LAB — CBC WITH DIFFERENTIAL/PLATELET
BASO%: 0.4 % (ref 0.0–2.0)
Basophils Absolute: 0 10*3/uL (ref 0.0–0.1)
EOS%: 2.3 % (ref 0.0–7.0)
Eosinophils Absolute: 0.2 10*3/uL (ref 0.0–0.5)
HCT: 24.7 % — ABNORMAL LOW (ref 38.4–49.9)
HEMOGLOBIN: 7.8 g/dL — AB (ref 13.0–17.1)
LYMPH%: 10.4 % — AB (ref 14.0–49.0)
MCH: 29.7 pg (ref 27.2–33.4)
MCHC: 31.6 g/dL — ABNORMAL LOW (ref 32.0–36.0)
MCV: 93.9 fL (ref 79.3–98.0)
MONO#: 0.7 10*3/uL (ref 0.1–0.9)
MONO%: 9.4 % (ref 0.0–14.0)
NEUT#: 5.3 10*3/uL (ref 1.5–6.5)
NEUT%: 77.5 % — ABNORMAL HIGH (ref 39.0–75.0)
PLATELETS: 252 10*3/uL (ref 140–400)
RBC: 2.63 10*6/uL — ABNORMAL LOW (ref 4.20–5.82)
RDW: 17.1 % — AB (ref 11.0–14.6)
WBC: 6.9 10*3/uL (ref 4.0–10.3)
lymph#: 0.7 10*3/uL — ABNORMAL LOW (ref 0.9–3.3)

## 2017-04-01 MED ORDER — ATROPINE SULFATE 1 MG/ML IJ SOLN: 0.5000 mg | Freq: Once | INTRAMUSCULAR | Status: DC | PRN

## 2017-04-01 MED ORDER — PALONOSETRON HCL INJECTION 0.25 MG/5ML
INTRAVENOUS | Status: AC
Start: 2017-04-01 — End: 2017-04-01
  Filled 2017-04-01: qty 5

## 2017-04-01 MED ORDER — SODIUM CHLORIDE 0.9 % IJ SOLN
10.0000 mL | INTRAMUSCULAR | Status: DC | PRN
  Administered 2017-04-01: 10 mL via INTRAVENOUS
  Filled 2017-04-01: qty 10

## 2017-04-01 MED ORDER — CIPROFLOXACIN HCL 500 MG PO TABS: 500.0000 mg | ORAL_TABLET | Freq: Two times a day (BID) | ORAL | 0 refills | Status: DC

## 2017-04-01 MED ORDER — SODIUM CHLORIDE 0.9 % IV SOLN
Freq: Once | INTRAVENOUS | Status: AC
  Administered 2017-04-01: 10:00:00 via INTRAVENOUS
  Filled 2017-04-01: qty 5

## 2017-04-01 MED ORDER — SODIUM CHLORIDE 0.9 % IV SOLN
400.0000 mg/m2 | Freq: Once | INTRAVENOUS | Status: AC
  Administered 2017-04-01: 744 mg via INTRAVENOUS
  Filled 2017-04-01: qty 37.2

## 2017-04-01 MED ORDER — SODIUM CHLORIDE 0.9 % IV SOLN
2400.0000 mg/m2 | INTRAVENOUS | Status: DC
  Administered 2017-04-01: 4450 mg via INTRAVENOUS
  Filled 2017-04-01: qty 89

## 2017-04-01 MED ORDER — SODIUM CHLORIDE 0.9 % IV SOLN
Freq: Once | INTRAVENOUS | Status: AC
  Administered 2017-04-01: 10:00:00 via INTRAVENOUS

## 2017-04-01 MED ORDER — PALONOSETRON HCL INJECTION 0.25 MG/5ML
0.2500 mg | Freq: Once | INTRAVENOUS | Status: AC
  Administered 2017-04-01: 0.25 mg via INTRAVENOUS

## 2017-04-01 MED ORDER — SODIUM CHLORIDE 0.9 % IV SOLN
100.0000 mg/m2 | Freq: Once | INTRAVENOUS | Status: AC
  Administered 2017-04-01: 180 mg via INTRAVENOUS
  Filled 2017-04-01: qty 5

## 2017-04-01 NOTE — Telephone Encounter (Signed)
Scheduled appt per 6/11 los - patient to get schedule in the treatment area

## 2017-04-01 NOTE — Patient Instructions (Signed)
Implanted Port Home Guide An implanted port is a type of central line that is placed under the skin. Central lines are used to provide IV access when treatment or nutrition needs to be given through a person's veins. Implanted ports are used for long-term IV access. An implanted port may be placed because:  You need IV medicine that would be irritating to the small veins in your hands or arms.  You need long-term IV medicines, such as antibiotics.  You need IV nutrition for a long period.  You need frequent blood draws for lab tests.  You need dialysis.  Implanted ports are usually placed in the chest area, but they can also be placed in the upper arm, the abdomen, or the leg. An implanted port has two main parts:  Reservoir. The reservoir is round and will appear as a small, raised area under your skin. The reservoir is the part where a needle is inserted to give medicines or draw blood.  Catheter. The catheter is a thin, flexible tube that extends from the reservoir. The catheter is placed into a large vein. Medicine that is inserted into the reservoir goes into the catheter and then into the vein.  How will I care for my incision site? Do not get the incision site wet. Bathe or shower as directed by your health care provider. How is my port accessed? Special steps must be taken to access the port:  Before the port is accessed, a numbing cream can be placed on the skin. This helps numb the skin over the port site.  Your health care provider uses a sterile technique to access the port. ? Your health care provider must put on a mask and sterile gloves. ? The skin over your port is cleaned carefully with an antiseptic and allowed to dry. ? The port is gently pinched between sterile gloves, and a needle is inserted into the port.  Only "non-coring" port needles should be used to access the port. Once the port is accessed, a blood return should be checked. This helps ensure that the port  is in the vein and is not clogged.  If your port needs to remain accessed for a constant infusion, a clear (transparent) bandage will be placed over the needle site. The bandage and needle will need to be changed every week, or as directed by your health care provider.  Keep the bandage covering the needle clean and dry. Do not get it wet. Follow your health care provider's instructions on how to take a shower or bath while the port is accessed.  If your port does not need to stay accessed, no bandage is needed over the port.  What is flushing? Flushing helps keep the port from getting clogged. Follow your health care provider's instructions on how and when to flush the port. Ports are usually flushed with saline solution or a medicine called heparin. The need for flushing will depend on how the port is used.  If the port is used for intermittent medicines or blood draws, the port will need to be flushed: ? After medicines have been given. ? After blood has been drawn. ? As part of routine maintenance.  If a constant infusion is running, the port may not need to be flushed.  How long will my port stay implanted? The port can stay in for as long as your health care provider thinks it is needed. When it is time for the port to come out, surgery will be   done to remove it. The procedure is similar to the one performed when the port was put in. When should I seek immediate medical care? When you have an implanted port, you should seek immediate medical care if:  You notice a bad smell coming from the incision site.  You have swelling, redness, or drainage at the incision site.  You have more swelling or pain at the port site or the surrounding area.  You have a fever that is not controlled with medicine.  This information is not intended to replace advice given to you by your health care provider. Make sure you discuss any questions you have with your health care provider. Document  Released: 10/08/2005 Document Revised: 03/15/2016 Document Reviewed: 06/15/2013 Elsevier Interactive Patient Education  2017 [employer] Inc[employer]  

## 2017-04-01 NOTE — Patient Instructions (Signed)
Jacob Hardin Discharge Instructions for Patients Receiving Chemotherapy  Today you received the following chemotherapy agents Leucovorin, Camptosar, Adrucil  To help prevent nausea and vomiting after your treatment, we encourage you to take your nausea medication as directed If you develop nausea and vomiting that is not controlled by your nausea medication, call the clinic.   BELOW ARE SYMPTOMS THAT SHOULD BE REPORTED IMMEDIATELY:  *FEVER GREATER THAN 100.5 F  *CHILLS WITH OR WITHOUT FEVER  NAUSEA AND VOMITING THAT IS NOT CONTROLLED WITH YOUR NAUSEA MEDICATION  *UNUSUAL SHORTNESS OF BREATH  *UNUSUAL BRUISING OR BLEEDING  TENDERNESS IN MOUTH AND THROAT WITH OR WITHOUT PRESENCE OF ULCERS  *URINARY PROBLEMS  *BOWEL PROBLEMS  UNUSUAL RASH Items with * indicate a potential emergency and should be followed up as soon as possible.  Feel free to call the clinic you have any questions or concerns. The clinic phone number is [phone].  Please show the [place] at check-in to the Emergency Department and triage nurse.

## 2017-04-01 NOTE — Progress Notes (Addendum)
[place] OFFICE PROGRESS NOTE   Diagnosis:  Pancreas cancer  INTERVAL HISTORY:   Jacob Hardin returns as scheduled. He completed another cycle of FOLFIRI 03/11/2017. He had no significant nausea. He feels like the steroids helped. Blood sugars increased to the 600 range. He has a sliding scale insulin. No mouth sores. No diarrhea. He underwent conversion of the external biliary drain catheter to an internal/external biliary drain catheter on 03/20/2017. A few days later he started having fevers to a maximum of 101. He began ciprofloxacin and the fevers resolved. He completed the course of Cipro. He has noted bilateral ankle edema. The edema worsens as the day progresses. He denies any bleeding. No shortness of breath.  Objective:  Vital signs in last 24 hours:  Blood pressure 132/80, pulse 66, temperature 97.8 F (36.6 C), temperature source Oral, resp. rate 18, height 6' (1.829 m), weight 149 lb 6.4 oz (67.8 kg), SpO2 100 %.    HEENT: No thrush or ulcers. Resp: Lungs clear bilaterally. Cardio: Regular rate and rhythm. GI: Abdomen soft and nontender. No hepatomegaly. No apparent ascites. Right abdomen biliary drainage catheter. Vascular: Trace to 1+ pitting edema at the ankles bilaterally. Port-A-Cath without erythema.    Lab Results:  Lab Results  Component Value Date   WBC 6.9 04/01/2017   HGB 7.8 (L) 04/01/2017   HCT 24.7 (L) 04/01/2017   MCV 93.9 04/01/2017   PLT 252 04/01/2017   NEUTROABS 5.3 04/01/2017    Imaging:  No results found.  Medications: I have reviewed the patient's current medications.  Assessment/Plan: 1.Pancreas cancer, stage IV, pancreas head mass, status post an EUS biopsy 11/18/2015 confirming adenocarcinoma  Upper endoscopy 11/18/2015 confirmed duodenal invasion/obstruction with a biopsy confirming adenocarcinoma  Placement of a duodenal stent 11/22/2015  MRI abdomen 11/15/2015 revealed a pancreas head mass and liver  metastases  Cycle 1 FOLFIRINOX 12/06/2015  Cycle 2 FOLFIRINOX 12/21/2015  Cycle 3 FOLFIRINOX 01/04/2016  CT abdomen/pelvis 01/19/2016 showed improvement in the pancreatic head mass and liver metastases.  Cycle 4 FOLFIRINOX 02/01/2016  Cycle 5 FOLFIRINOX 02/15/2016  Cycle 6 FOLFIRINOX 02/29/2016  Cycle 7 FOLFIRINOX 03/14/2016  Cycle 8 FOLFIRINOX 03/28/2016  CT abdomen/pelvis 04/09/2016-improvement in pancreas head mass and liver metastases, no new metastatic disease  Chemotherapy switch to FOLFIRI every 3 weeks beginning 04/11/2016  CT 07/30/2016-improvement in liver metastases, stable pancreas mass   FOLFIRI continued on a 3 week schedule  FOLFIRI 09/19/2016.  Treatment subsequently placed on hold due to biliary obstruction  CT 10/13/2016-no change in liver metastases or pancreas mass  FOLFIRI resumed on a 3 week schedule 01/07/2017  FOLFIRI 01/28/2017  FOLFIRI 02/26/2017  FOLFIRI 03/11/2017 (irinotecan dose reduced secondary to prolonged nausea)  FOLFIRI 04/01/2017  2. Diabetes  3. History of Anorexia/weight loss secondary to #1  4. Obstructive jaundice secondary to #1-status post placement of a percutaneous internal/external biliary drain 11/25/2015; biliary drainage catheter capped 12/02/2015; biliary drain internalized 12/13/2015  5. Port-A-Cath placement 11/30/2015 interventional radiology  6. Admission 01/17/2016 with a high fever and rigors-no apparent source for infection upon review of his history and examination, blood cultures positive for gram-positive cocci--viridans streptococcus--completed 2 weeks of IV ceftriaxone; no endocarditis on TTE.  7. Oxaliplatin neuropathy  8. Elevated transaminases, alkaline phosphatase, and bilirubin 06/13/2016.06/19/2016 bilirubin improved to normal range; liver enzymes remain elevated.  9. Generalized pruritus,no rash 10/10/2016.Likely secondary to hyperbilirubinemia. Resolved  11/12/2016. Recurrent 12/03/2016. Persistent pruritus.  10. Hospitalization 10/10/2016 through 10/20/2016 with biliary obstruction/sepsis. Status post placement of a biliary  drain in interventional radiology 10/16/2016. Status post ERCP 10/26/2016 with biliary stent exchange. There was no bile drainage. Status post cholangiogram and exchange of biliary drain 11/07/2016.Biliary drain capped 11/12/2016.  Exchange of right sided percutaneous biliary drainage catheter 02/12/2017  Exchange of external biliary drain for internal/external biliary drain 03/20/2017  11. 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 C. difficile colitis 04/26/2018status post course of vancomycin.   14. Delayed nausea following chemotherapy-irinotecan dose reduced and prophylactic Decadron added with chemotherapy 03/11/2017; no significant nausea following chemotherapy 03/11/2017.    Disposition: Mr. Ambrosius appears stable. He has completed 4 cycles of FOLFIRI since treatment was resumed 01/07/2017. Plan to proceed with cycle 5 today as scheduled. We are referring him for restaging CT scans a few days prior to his next office visit in 3 weeks.  The steroids helped with the delayed nausea. Blood sugars however increased significantly. He will monitor blood sugars more closely and administer sliding scale insulin as prescribed.  He developed fevers following placement of the internal/external biliary drain. He completed a course of ciprofloxacin and the fevers resolved. He understands to contact the office with recurrent fever. He was given a prescription for Cipro to have at home.  He has progressive anemia. He appears asymptomatic. We will repeat a CBC when he returns for the pump discontinuation on 04/03/2017 with red cell transfusion support as needed.  He will return for a follow-up visit on 04/22/2017. Restaging CT scans on 04/19/2017. He  will contact the office prior to his next visit as outlined above or with any other problems.  Patient seen with Dr. [name]. 25 minutes were spent face-to-face at today's visit with the majority of that time involved in counseling/coordination of care.    [name] ANP/GNP-BC   04/01/2017  8:56 AM This was a shared visit with [name]. Mr. Studstill tolerated the most recent cycle of FOLFIRI better with the addition of prophylactic Decadron. However he developed marked hyperglycemia. He will monitor the blood sugar more closely following this cycle.  He developed fever following the most recent biliary stent exchange. He will contact us for recurrent fever.  [name], M.D.

## 2017-04-02 LAB — CANCER ANTIGEN 19-9

## 2017-04-03 ENCOUNTER — Ambulatory Visit (HOSPITAL_BASED_OUTPATIENT_CLINIC_OR_DEPARTMENT_OTHER): Payer: Medicare Other

## 2017-04-03 ENCOUNTER — Encounter: Payer: Medicare Other | Admitting: Nutrition

## 2017-04-03 ENCOUNTER — Other Ambulatory Visit (HOSPITAL_BASED_OUTPATIENT_CLINIC_OR_DEPARTMENT_OTHER): Payer: Medicare Other

## 2017-04-03 VITALS — BP 128/80 | HR 68 | Temp 98.0°F | Resp 18

## 2017-04-03 DIAGNOSIS — C250 Malignant neoplasm of head of pancreas: Secondary | ICD-10-CM

## 2017-04-03 DIAGNOSIS — Z452 Encounter for adjustment and management of vascular access device: Secondary | ICD-10-CM | POA: Diagnosis not present

## 2017-04-03 LAB — CBC WITH DIFFERENTIAL/PLATELET
BASO%: 0.1 % (ref 0.0–2.0)
BASOS ABS: 0 10*3/uL (ref 0.0–0.1)
EOS%: 0.5 % (ref 0.0–7.0)
Eosinophils Absolute: 0.1 10*3/uL (ref 0.0–0.5)
HEMATOCRIT: 29.5 % — AB (ref 38.4–49.9)
HGB: 9.2 g/dL — ABNORMAL LOW (ref 13.0–17.1)
LYMPH%: 11.7 % — ABNORMAL LOW (ref 14.0–49.0)
MCH: 29.2 pg (ref 27.2–33.4)
MCHC: 31.2 g/dL — AB (ref 32.0–36.0)
MCV: 93.7 fL (ref 79.3–98.0)
MONO#: 0.3 10*3/uL (ref 0.1–0.9)
MONO%: 2.3 % (ref 0.0–14.0)
NEUT#: 11.1 10*3/uL — ABNORMAL HIGH (ref 1.5–6.5)
NEUT%: 85.4 % — AB (ref 39.0–75.0)
Platelets: 336 10*3/uL (ref 140–400)
RBC: 3.15 10*6/uL — ABNORMAL LOW (ref 4.20–5.82)
RDW: 17.6 % — ABNORMAL HIGH (ref 11.0–14.6)
WBC: 13 10*3/uL — ABNORMAL HIGH (ref 4.0–10.3)
lymph#: 1.5 10*3/uL (ref 0.9–3.3)

## 2017-04-03 MED ORDER — HEPARIN SOD (PORK) LOCK FLUSH 100 UNIT/ML IV SOLN
500.0000 [IU] | Freq: Once | INTRAVENOUS | Status: AC | PRN
  Administered 2017-04-03: 500 [IU]
  Filled 2017-04-03: qty 5

## 2017-04-03 MED ORDER — SODIUM CHLORIDE 0.9% FLUSH
10.0000 mL | INTRAVENOUS | Status: DC | PRN
  Administered 2017-04-03: 10 mL
  Filled 2017-04-03: qty 10

## 2017-04-08 ENCOUNTER — Telehealth: Payer: Self-pay | Admitting: *Deleted

## 2017-04-08 ENCOUNTER — Other Ambulatory Visit: Payer: Self-pay | Admitting: Gastroenterology

## 2017-04-08 ENCOUNTER — Ambulatory Visit (HOSPITAL_BASED_OUTPATIENT_CLINIC_OR_DEPARTMENT_OTHER): Payer: Medicare Other | Admitting: Oncology

## 2017-04-08 VITALS — BP 125/79 | HR 78 | Temp 98.3°F | Resp 18 | Ht 72.0 in | Wt 140.8 lb

## 2017-04-08 DIAGNOSIS — R142 Eructation: Secondary | ICD-10-CM

## 2017-04-08 DIAGNOSIS — R112 Nausea with vomiting, unspecified: Secondary | ICD-10-CM

## 2017-04-08 DIAGNOSIS — C250 Malignant neoplasm of head of pancreas: Secondary | ICD-10-CM | POA: Diagnosis not present

## 2017-04-08 DIAGNOSIS — C787 Secondary malignant neoplasm of liver and intrahepatic bile duct: Secondary | ICD-10-CM

## 2017-04-08 DIAGNOSIS — D6481 Anemia due to antineoplastic chemotherapy: Secondary | ICD-10-CM | POA: Diagnosis not present

## 2017-04-08 DIAGNOSIS — E119 Type 2 diabetes mellitus without complications: Secondary | ICD-10-CM | POA: Diagnosis not present

## 2017-04-08 DIAGNOSIS — D638 Anemia in other chronic diseases classified elsewhere: Secondary | ICD-10-CM | POA: Diagnosis not present

## 2017-04-08 DIAGNOSIS — R140 Abdominal distension (gaseous): Secondary | ICD-10-CM | POA: Diagnosis not present

## 2017-04-08 NOTE — Progress Notes (Signed)
[place] OFFICE PROGRESS NOTE   Diagnosis: Pancreas cancer  INTERVAL HISTORY:   Jacob Hardin returns as scheduled. He completed another cycle of FOLFIRI on 04/01/2017. He did not take Decadron prophylaxis following chemotherapy. No nausea or vomiting. He developed abdominal bloating and belching while attending a rehearsal dinner on 04/05/2017. The belching lasted until the early a.m. on 04/06/2017. He had similar recurrent symptoms after eating on 04/06/2017 and 04/07/2017. He feels like he needs to throw up, but there has been no emesis. He is tolerating liquids without difficulty. He contacted Dr. [name] earlier today and is being scheduled for an upper GI procedure. No abdominal pain. No fever. Minimal output from the biliary drain.  Objective:  Vital signs in last 24 hours:  Blood pressure 125/79, pulse 78, temperature 98.3 F (36.8 C), temperature source Oral, resp. rate 18, height 6' (1.829 m), weight 140 lb 12.8 oz (63.9 kg), SpO2 100 %.    HEENT: No thrush or ulcers Resp: Lungs clear bilaterally Cardio: Regular rate and rhythm GI: Nontender, nondistended, no hepatosplenomegaly, no mass, biliary drain site with a gauze dressing Vascular: No leg edema   Portacath/PICC-without erythema  Lab Results:  Lab Results  Component Value Date   WBC 13.0 (H) 04/03/2017   HGB 9.2 (L) 04/03/2017   HCT 29.5 (L) 04/03/2017   MCV 93.7 04/03/2017   PLT 336 04/03/2017   NEUTROABS 11.1 (H) 04/03/2017    CMP     Component Value Date/Time   NA 137 04/01/2017 0756   K 3.9 04/01/2017 0756   CL 97 (L) 03/20/2017 1350   CO2 25 04/01/2017 0756   GLUCOSE 258 (H) 04/01/2017 0756   BUN 11.6 04/01/2017 0756   CREATININE 0.7 04/01/2017 0756   CALCIUM 8.6 04/01/2017 0756   PROT 5.8 (L) 04/01/2017 0756   ALBUMIN 2.2 (L) 04/01/2017 0756   AST 65 (H) 04/01/2017 0756   ALT 53 04/01/2017 0756   ALKPHOS 1,313 (H) 04/01/2017 0756   BILITOT 0.83 04/01/2017 0756   GFRNONAA >60  03/20/2017 1350   GFRNONAA 79 11/03/2015 0839   GFRAA >60 03/20/2017 1350   GFRAA >89 11/03/2015 0839   CA 19-9 on 04/01/2017-1511  Medications: I have reviewed the patient's current medications.  Assessment/Plan: 1.Pancreas cancer, stage IV, pancreas head mass, status post an EUS biopsy 11/18/2015 confirming adenocarcinoma  Upper endoscopy 11/18/2015 confirmed duodenal invasion/obstruction with a biopsy confirming adenocarcinoma  Placement of a duodenal stent 11/22/2015  MRI abdomen 11/15/2015 revealed a pancreas head mass and liver metastases  Cycle 1 FOLFIRINOX 12/06/2015  Cycle 2 FOLFIRINOX 12/21/2015  Cycle 3 FOLFIRINOX 01/04/2016  CT abdomen/pelvis 01/19/2016 showed improvement in the pancreatic head mass and liver metastases.  Cycle 4 FOLFIRINOX 02/01/2016  Cycle 5 FOLFIRINOX 02/15/2016  Cycle 6 FOLFIRINOX 02/29/2016  Cycle 7 FOLFIRINOX 03/14/2016  Cycle 8 FOLFIRINOX 03/28/2016  CT abdomen/pelvis 04/09/2016-improvement in pancreas head mass and liver metastases, no new metastatic disease  Chemotherapy switch to FOLFIRI every 3 weeks beginning 04/11/2016  CT 07/30/2016-improvement in liver metastases, stable pancreas mass   FOLFIRI continued on a 3 week schedule  FOLFIRI 09/19/2016.  Treatment subsequently placed on hold due to biliary obstruction  CT 10/13/2016-no change in liver metastases or pancreas mass  FOLFIRI resumed on a 3 week schedule 01/07/2017  FOLFIRI 01/28/2017  FOLFIRI 02/26/2017  FOLFIRI 03/11/2017 (irinotecan dose reduced secondary to prolonged nausea)  FOLFIRI 04/01/2017  2. Diabetes  3. History of Anorexia/weight loss secondary to #1  4. Obstructive jaundice secondary to #1-status  post placement of a percutaneous internal/external biliary drain 11/25/2015; biliary drainage catheter capped 12/02/2015; biliary drain internalized 12/13/2015  5. Port-A-Cath placement 11/30/2015 interventional radiology  6.  Admission 01/17/2016 with a high fever and rigors-no apparent source for infection upon review of his history and examination, blood cultures positive for gram-positive cocci--viridans streptococcus--completed 2 weeks of IV ceftriaxone; no endocarditis on TTE.  7. Oxaliplatin neuropathy  8. Elevated transaminases, alkaline phosphatase, and bilirubin 06/13/2016.06/19/2016 bilirubin improved to normal range; liver enzymes remain elevated.  9. Generalized pruritus,no rash 10/10/2016.Likely secondary to hyperbilirubinemia. Resolved 11/12/2016. Recurrent 12/03/2016.Persistent pruritus.  10. Hospitalization 10/10/2016 through 10/20/2016 with biliary obstruction/sepsis. Status post placement of a biliary drain in interventional radiology 10/16/2016. Status post ERCP 10/26/2016 with biliary stent exchange. There was no bile drainage. Status post cholangiogram and exchange of biliary drain 11/07/2016.Biliary drain capped 11/12/2016.  Exchange of right sided percutaneous biliary drainage catheter 02/12/2017  Exchange of external biliary drain for internal/external biliary drain 03/20/2017  11. 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 C. difficile colitis 04/26/2018status post course of vancomycin.   14. Delayed nausea following chemotherapy-irinotecan dose reduced and prophylactic Decadron added with chemotherapy 03/11/2017; no significant nausea following chemotherapy 03/11/2017.   Disposition:  Jacob Hardin is now day 8 following the most recent cycle of FOLFIRI. He tolerated the FOLFIRI well. I doubt the bloating and belching are related to irinotecan, but this is possible. I am concerned he is developing obstructive symptoms, potentially related to tumor or occlusion of the duodenal stent. He is being scheduled for an upper GI study this week. He will ask radiology to he will return for an office visit as  scheduled on 04/22/2017. I am available to see him sooner as needed.  Donneta Romberg, MD  04/08/2017  4:44 PM

## 2017-04-08 NOTE — Telephone Encounter (Signed)
Message received from patient's wife stating that patient has been "sick" since Friday, belching frequently and not eating.  Dr. [name] notified and would like for pt to come in today to be seen at 3:00PM.  Call placed back to patient's wife to notify her of appt time.  Patient's wife states pt will be here for appt today at 3:00PM.

## 2017-04-09 ENCOUNTER — Ambulatory Visit
Admission: RE | Admit: 2017-04-09 | Discharge: 2017-04-09 | Disposition: A | Discharge status: Home / Self Care | Payer: Medicare Other | Source: Ambulatory Visit | Attending: Gastroenterology | Admitting: Gastroenterology

## 2017-04-09 ENCOUNTER — Other Ambulatory Visit: Payer: Self-pay | Admitting: Gastroenterology

## 2017-04-09 DIAGNOSIS — R112 Nausea with vomiting, unspecified: Secondary | ICD-10-CM

## 2017-04-10 ENCOUNTER — Encounter (HOSPITAL_COMMUNITY)
Admission: RE | Disposition: A | Discharge status: Home / Self Care | Payer: Self-pay | Source: Ambulatory Visit | Attending: Gastroenterology

## 2017-04-10 ENCOUNTER — Encounter (HOSPITAL_COMMUNITY): Payer: Self-pay | Admitting: Registered Nurse

## 2017-04-10 ENCOUNTER — Encounter (HOSPITAL_COMMUNITY): Payer: Self-pay | Admitting: Gastroenterology

## 2017-04-10 ENCOUNTER — Ambulatory Visit (HOSPITAL_COMMUNITY)
Admission: RE | Admit: 2017-04-10 | Discharge: 2017-04-10 | Disposition: A | Discharge status: Home / Self Care | Payer: Medicare Other | Source: Ambulatory Visit | Attending: Gastroenterology | Admitting: Gastroenterology

## 2017-04-10 ENCOUNTER — Ambulatory Visit (HOSPITAL_COMMUNITY): Payer: Medicare Other

## 2017-04-10 ENCOUNTER — Other Ambulatory Visit: Payer: Self-pay | Admitting: Gastroenterology

## 2017-04-10 DIAGNOSIS — C787 Secondary malignant neoplasm of liver and intrahepatic bile duct: Secondary | ICD-10-CM

## 2017-04-10 DIAGNOSIS — C259 Malignant neoplasm of pancreas, unspecified: Secondary | ICD-10-CM

## 2017-04-10 DIAGNOSIS — C784 Secondary malignant neoplasm of small intestine: Secondary | ICD-10-CM

## 2017-04-10 DIAGNOSIS — Z85828 Personal history of other malignant neoplasm of skin: Secondary | ICD-10-CM | POA: Insufficient documentation

## 2017-04-10 DIAGNOSIS — E785 Hyperlipidemia, unspecified: Secondary | ICD-10-CM | POA: Insufficient documentation

## 2017-04-10 DIAGNOSIS — Z885 Allergy status to narcotic agent status: Secondary | ICD-10-CM | POA: Insufficient documentation

## 2017-04-10 DIAGNOSIS — Z801 Family history of malignant neoplasm of trachea, bronchus and lung: Secondary | ICD-10-CM

## 2017-04-10 DIAGNOSIS — K315 Obstruction of duodenum: Secondary | ICD-10-CM

## 2017-04-10 DIAGNOSIS — I10 Essential (primary) hypertension: Secondary | ICD-10-CM

## 2017-04-10 DIAGNOSIS — K219 Gastro-esophageal reflux disease without esophagitis: Secondary | ICD-10-CM

## 2017-04-10 DIAGNOSIS — R935 Abnormal findings on diagnostic imaging of other abdominal regions, including retroperitoneum: Secondary | ICD-10-CM | POA: Insufficient documentation

## 2017-04-10 DIAGNOSIS — A4159 Other Gram-negative sepsis: Secondary | ICD-10-CM | POA: Diagnosis not present

## 2017-04-10 DIAGNOSIS — E119 Type 2 diabetes mellitus without complications: Secondary | ICD-10-CM | POA: Insufficient documentation

## 2017-04-10 DIAGNOSIS — A419 Sepsis, unspecified organism: Secondary | ICD-10-CM | POA: Diagnosis not present

## 2017-04-10 DIAGNOSIS — Z8042 Family history of malignant neoplasm of prostate: Secondary | ICD-10-CM | POA: Insufficient documentation

## 2017-04-10 DIAGNOSIS — Z800 Family history of malignant neoplasm of digestive organs: Secondary | ICD-10-CM | POA: Insufficient documentation

## 2017-04-10 DIAGNOSIS — Z833 Family history of diabetes mellitus: Secondary | ICD-10-CM

## 2017-04-10 HISTORY — PX: DUODENAL STENT PLACEMENT: SHX5541

## 2017-04-10 HISTORY — PX: ESOPHAGOGASTRODUODENOSCOPY (EGD) WITH PROPOFOL: SHX5813

## 2017-04-10 LAB — GLUCOSE, CAPILLARY: Glucose-Capillary: 148 mg/dL — ABNORMAL HIGH (ref 65–99)

## 2017-04-10 SURGERY — ESOPHAGOGASTRODUODENOSCOPY (EGD) WITH PROPOFOL
Anesthesia: Moderate Sedation

## 2017-04-10 MED ORDER — FENTANYL CITRATE (PF) 100 MCG/2ML IJ SOLN
INTRAMUSCULAR | Status: AC
  Filled 2017-04-10: qty 2

## 2017-04-10 MED ORDER — HEPARIN SOD (PORK) LOCK FLUSH 100 UNIT/ML IV SOLN
INTRAVENOUS | Status: AC
  Filled 2017-04-10: qty 5

## 2017-04-10 MED ORDER — LACTATED RINGERS IV SOLN
INTRAVENOUS | Status: DC
  Administered 2017-04-10: 12:00:00 via INTRAVENOUS

## 2017-04-10 MED ORDER — PROPOFOL 10 MG/ML IV BOLUS
INTRAVENOUS | Status: AC
  Filled 2017-04-10: qty 40

## 2017-04-10 MED ORDER — HEPARIN SOD (PORK) LOCK FLUSH 100 UNIT/ML IV SOLN: 500.0000 [IU] | INTRAVENOUS | Status: DC | PRN

## 2017-04-10 MED ORDER — MIDAZOLAM HCL 10 MG/2ML IJ SOLN
INTRAMUSCULAR | Status: DC | PRN
  Administered 2017-04-10: 1 mg via INTRAVENOUS
  Administered 2017-04-10 (×4): 2 mg via INTRAVENOUS

## 2017-04-10 MED ORDER — PROMETHAZINE HCL 25 MG/ML IJ SOLN
INTRAMUSCULAR | Status: DC | PRN
  Administered 2017-04-10: 12.5 mg via INTRAVENOUS

## 2017-04-10 MED ORDER — DIPHENHYDRAMINE HCL 50 MG/ML IJ SOLN
INTRAMUSCULAR | Status: AC
  Filled 2017-04-10: qty 1

## 2017-04-10 MED ORDER — SODIUM CHLORIDE 0.9 % IV SOLN: INTRAVENOUS | Status: DC

## 2017-04-10 MED ORDER — MIDAZOLAM HCL 5 MG/ML IJ SOLN
INTRAMUSCULAR | Status: AC
  Filled 2017-04-10: qty 2

## 2017-04-10 MED ORDER — PROMETHAZINE HCL 25 MG/ML IJ SOLN
INTRAMUSCULAR | Status: AC
  Filled 2017-04-10: qty 1

## 2017-04-10 MED ORDER — FENTANYL CITRATE (PF) 100 MCG/2ML IJ SOLN
INTRAMUSCULAR | Status: DC | PRN
  Administered 2017-04-10: 25 ug via INTRAVENOUS

## 2017-04-10 SURGICAL SUPPLY — 15 items

## 2017-04-10 NOTE — H&P (Signed)
Jacob Hardin HPI: 71 year old male with pancreatic cancer invading into the duodenum.  A duodenal stent was placed in the past, but the upper GI series reports that there is very little passage of contrast material.    Past Medical History:  Diagnosis Date  . Elevated liver enzymes   . GERD (gastroesophageal reflux disease)   . Hyperlipidemia   . Hypertension    recently weight lost-no meds now-running low.  . Obstructive jaundice   . Oxalate nephropathy   . PONV (postoperative nausea and vomiting)   . Primary pancreatic adenocarcinoma (HCC)    mets to liver and duodenum  . Pruritus   . Restless legs   . Skin cancer    "burned off my face & head"  . Type II diabetes mellitus ([name])   . Weight loss     Past Surgical History:  Procedure Laterality Date  . ANTERIOR CERVICAL DECOMP/DISCECTOMY FUSION  1994   cervical-bone graft  . BACK SURGERY    . COLONOSCOPY    . DUODENAL STENT PLACEMENT N/A 11/22/2015   Procedure: DUODENAL STENT PLACEMENT;  Surgeon: Carol Ada, MD;  Location: [place];  Service: Endoscopy;  Laterality: N/A;  . ERCP N/A 10/26/2016   Procedure: ENDOSCOPIC RETROGRADE CHOLANGIOPANCREATOGRAPHY (ERCP);  Surgeon: Carol Ada, MD;  Location: [facility] ENDOSCOPY;  Service: Endoscopy;  Laterality: N/A;  . ESOPHAGOGASTRODUODENOSCOPY N/A 11/18/2015   Procedure: ESOPHAGOGASTRODUODENOSCOPY (EGD);  Surgeon: Carol Ada, MD;  Location[facility] ENDOSCOPY;  Service: Endoscopy;  Laterality: N/A;  . ESOPHAGOGASTRODUODENOSCOPY N/A 11/22/2015   Procedure: ESOPHAGOGASTRODUODENOSCOPY (EGD);  Surgeon: Carol Ada, MD;  Location[facility] ENDOSCOPY;  Service: Endoscopy;  Laterality: N/A;  Uncovered duodenal stent placement.  Fluoroscopy required.  . ESOPHAGOGASTRODUODENOSCOPY N/A 10/14/2016   Procedure: ESOPHAGOGASTRODUODENOSCOPY (EGD);  Surgeon: Carol Ada, MD;  Location: [facility] ENDOSCOPY;  Service: Endoscopy;  Laterality: N/A;  . EUS  11/18/2015   Procedure: UPPER ENDOSCOPIC ULTRASOUND (EUS)  LINEAR;  Surgeon: Carol Ada, MD;  Location: [place];  Service: Endoscopy;;  . INGUINAL HERNIA REPAIR Left   . IR CONVERT BILIARY DRAIN TO INT EXT BILIARY DRAIN  03/20/2017  . IR EXCHANGE BILIARY DRAIN  02/12/2017  . IR EXCHANGE BILIARY DRAIN  03/12/2017  . IR GENERIC HISTORICAL  10/16/2016   IR BILIARY DRAIN PLACEMENT WITH CHOLANGIOGRAM 10/16/2016 Marybelle Killings, MD [facility]-INTERV RAD  . IR GENERIC HISTORICAL  11/07/2016   IR EXCHANGE BILIARY DRAIN 11/07/2016 Arne Cleveland, MD [facility]INTER[facility]  . IR GENERIC HISTORICAL  11/12/2016   IR PATIENT EVAL TECH 0-60 MINS 11/12/2016 Arne Cleveland, MD [facility]INTER[facility]  . SHOULDER ARTHROSCOPY W/ ROTATOR CUFF REPAIR Left 2014  . TONSILLECTOMY    . WISDOM TOOTH EXTRACTION      Family History  Problem Relation Age of Onset  . Diabetes Mother   . Hypertension Mother   . Cancer Father        lung 69  . Hypertension Father   . Cancer Brother        pancreatic   . Diabetes Brother   . Cancer Paternal Grandfather        colon  . Cancer Paternal Uncle        prostate    Social History:  reports that he has never smoked. He has never used smokeless tobacco. He reports that he does not drink alcohol or use drugs.  Allergies:  Allergies  Allergen Reactions  . Codeine Nausea Only    Medications:  Scheduled:  Continuous: . lactated ringers 10 mL/hr at 04/10/17 1223  Results for orders placed or performed during the hospital encounter of 04/10/17 (from the past 24 hour(s))  Glucose, capillary     Status: Abnormal   Collection Time: 04/10/17 12:21 PM  Result Value Ref Range   Glucose-Capillary 148 (H) 65 - 99 mg/dL     Dg Ugi  W/kub  Result Date: 04/09/2017 CLINICAL DATA:  History of pancreatic carcinoma, placement of duodenal stent, now with nausea and vomiting EXAM: WATER SOLUBLE UPPER GI SERIES TECHNIQUE: Single-column upper GI series was performed using water soluble contrast. CONTRAST:  Water-soluble contrast was utilized COMPARISON:  CT  abdomen and pelvis of 10/13/2016 FLUOROSCOPY TIME:  Fluoroscopy Time:  1 minutes 54 second Radiation Exposure Index (if provided by the fluoroscopic device): 226 mGy Number of Acquired Spot Images: 0 FINDINGS: A preliminary film of the abdomen shows a percutaneous stent presumably within the biliary tract, as well as a descending duodenal stent and a common bile duct stent. No bowel obstruction is seen with a moderate amount of feces throughout the colon. Water-soluble contrast was administered. Rapid sequence spot films of the cervical esophagus show no aspiration or penetration. Esophageal peristalsis is unremarkable. The stomach distends with water soluble contrast and there is some food debris within the distended stomach. No contrast entered the duodenal stent on initial images. After a delay of approximately 10 minutes, additional images were obtained. There is a small amount of contrast within the duodenum stent, but no contrast is seen more distally. This suggests a relatively high-grade obstruction of the duodenal stent. IMPRESSION: Very little water soluble contrast is seen within the duodenal stent with no contrast passing more distally despite delay. These findings suggest a relatively high-grade obstruction of the duodenum stent. Electronically Signed   By: Ivar Drape M.D.   On: 04/09/2017 09:56    ROS:  As stated above in the HPI otherwise negative.  Blood pressure (!) 149/70, temperature 99.2 F (37.3 C), temperature source Oral, resp. rate 16, height 6' (1.829 m), weight 63.5 kg (140 lb), SpO2 99 %.    PE: Gen: NAD, Alert and Oriented HEENT:  [place]/AT, EOMI Neck: Supple, no LAD Lungs: CTA Bilaterally CV: RRR without M/G/R ABM: Soft, NTND, +BS Ext: No C/C/E  Assessment/Plan: 1) Abnormal Upper GI Series. 2) Nausea/vomiting. 3) Pancreatic cancer.  Plan: 1) EGD.  Keawe Marcello D 04/10/2017, 12:33 PM

## 2017-04-10 NOTE — Op Note (Signed)
[facility] Patient Name: Jacob Hardin Procedure Date: 04/10/2017 MRN: [id number] Attending MD: Carol Ada , MD Date of Birth: Apr 07, 1946 CSN: [id number] Age: 71 Admit Type: Outpatient Procedure:                Upper GI endoscopy Indications:              Abnormal UGI series Providers:                Carol Ada, MD, Laverta Baltimore RN, RN, Tinnie Gens, Technician Referring MD:              Medicines:                Midazolam 9 mg IV, Fentanyl 25 micrograms IV,                            Promethazine 37.6 mg IV Complications:            No immediate complications. Estimated Blood Loss:     Estimated blood loss was minimal. Procedure:                Pre-Anesthesia Assessment:                           - Prior to the procedure, a History and Physical                            was performed, and patient medications and                            allergies were reviewed. The patient's tolerance of                            previous anesthesia was also reviewed. The risks                            and benefits of the procedure and the sedation                            options and risks were discussed with the patient.                            All questions were answered, and informed consent                            was obtained. Prior Anticoagulants: The patient has                            taken no previous anticoagulant or antiplatelet                            agents. ASA Grade Assessment: III - A patient with  severe systemic disease. After reviewing the risks                            and benefits, the patient was deemed in                            satisfactory condition to undergo the procedure.                           - Sedation was administered by an endoscopy nurse.                            The sedation level attained was moderate.                           After obtaining informed  consent, the endoscope was                            passed under direct vision. Throughout the                            procedure, the patient's blood pressure, pulse, and                            oxygen saturations were monitored continuously. The                            Endoscope was introduced through the mouth, and                            advanced to the second part of duodenum. The                            [id number]) scope was introduced through the                            and advanced to the. The upper GI endoscopy was                            technically difficult and complex. The patient                            tolerated the procedure well. Scope In: Scope Out: Findings:      The esophagus was normal.      A large amount of food (residue) was found in the gastric fundus and in       the gastric body.      A malignant-appearing, intrinsic severe stenosis was found at the       pylorus. This was traversed. This was stented with a 22 mm x 60 mm       enteral Wallstent. Estimated blood loss was minimal.      A large fungating mass with no bleeding was found in the duodenal bulb       and in the second portion of the duodenum.  In the distal stomach the patient had a gastric outlet obstruction. I       was not able to see the proximal portion of the original duodenal stent.       With pressure and tip manipulation I was able to pass into the duodenum.       It was clear that the cancer extended to involve the proximal duodenal       bulb. A therapeutic EGD scope was used, which allowed for endoscopically       visualized stent deployment. The guidewire that was inserted was not       needed. The 22 x 60 mm stent was successfully deployed across the       stenosis and it overlapped the original duodenal stent. The endoscope       was able to pass through the overlapping stents with ease. Impression:               - Normal esophagus.                            - A large amount of food (residue) in the stomach.                           - Gastric stenosis was found at the pylorus.                            Prosthesis placed.                           - Malignant duodenal mass.                           - No specimens collected. Moderate Sedation:      N/A- Per Anesthesia Care Recommendation:           - Patient has a contact number available for                            emergencies. The signs and symptoms of potential                            delayed complications were discussed with the                            patient. Return to normal activities tomorrow.                            Written discharge instructions were provided to the                            patient.                           - Resume previous diet.                           - Continue present medications.                           -  Return to GI clinic in 2 weeks. 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 All rights reserved. The codes documented in this report are preliminary and upon coder review may  be revised to meet current compliance requirements. Carol Ada, MD Carol Ada, MD 04/10/2017 1:24:07 PM This report has been signed electronically. Number of Addenda: 0

## 2017-04-10 NOTE — Discharge Instructions (Signed)

## 2017-04-12 ENCOUNTER — Other Ambulatory Visit: Payer: Self-pay

## 2017-04-12 ENCOUNTER — Emergency Department (HOSPITAL_COMMUNITY): Payer: Medicare Other

## 2017-04-12 ENCOUNTER — Inpatient Hospital Stay (HOSPITAL_COMMUNITY)
Admission: EM | Admit: 2017-04-12 | Discharge: 2017-04-16 | DRG: 872 | Disposition: A | Discharge status: Home / Self Care | Payer: Medicare Other | Attending: Nephrology | Admitting: Nephrology

## 2017-04-12 ENCOUNTER — Encounter (HOSPITAL_COMMUNITY): Payer: Self-pay

## 2017-04-12 DIAGNOSIS — Z801 Family history of malignant neoplasm of trachea, bronchus and lung: Secondary | ICD-10-CM | POA: Diagnosis not present

## 2017-04-12 DIAGNOSIS — E11649 Type 2 diabetes mellitus with hypoglycemia without coma: Secondary | ICD-10-CM | POA: Diagnosis present

## 2017-04-12 DIAGNOSIS — Z95828 Presence of other vascular implants and grafts: Secondary | ICD-10-CM | POA: Diagnosis not present

## 2017-04-12 DIAGNOSIS — I159 Secondary hypertension, unspecified: Secondary | ICD-10-CM | POA: Diagnosis not present

## 2017-04-12 DIAGNOSIS — K219 Gastro-esophageal reflux disease without esophagitis: Secondary | ICD-10-CM | POA: Diagnosis present

## 2017-04-12 DIAGNOSIS — C250 Malignant neoplasm of head of pancreas: Secondary | ICD-10-CM | POA: Diagnosis present

## 2017-04-12 DIAGNOSIS — E785 Hyperlipidemia, unspecified: Secondary | ICD-10-CM | POA: Diagnosis present

## 2017-04-12 DIAGNOSIS — Z978 Presence of other specified devices: Secondary | ICD-10-CM | POA: Diagnosis not present

## 2017-04-12 DIAGNOSIS — G2581 Restless legs syndrome: Secondary | ICD-10-CM | POA: Diagnosis present

## 2017-04-12 DIAGNOSIS — Z66 Do not resuscitate: Secondary | ICD-10-CM | POA: Diagnosis present

## 2017-04-12 DIAGNOSIS — Z79899 Other long term (current) drug therapy: Secondary | ICD-10-CM | POA: Diagnosis not present

## 2017-04-12 DIAGNOSIS — A4159 Other Gram-negative sepsis: Secondary | ICD-10-CM | POA: Diagnosis present

## 2017-04-12 DIAGNOSIS — Z794 Long term (current) use of insulin: Secondary | ICD-10-CM | POA: Diagnosis not present

## 2017-04-12 DIAGNOSIS — E039 Hypothyroidism, unspecified: Secondary | ICD-10-CM | POA: Diagnosis present

## 2017-04-12 DIAGNOSIS — Z8619 Personal history of other infectious and parasitic diseases: Secondary | ICD-10-CM

## 2017-04-12 DIAGNOSIS — K311 Adult hypertrophic pyloric stenosis: Secondary | ICD-10-CM | POA: Diagnosis present

## 2017-04-12 DIAGNOSIS — I10 Essential (primary) hypertension: Secondary | ICD-10-CM | POA: Diagnosis present

## 2017-04-12 DIAGNOSIS — R7881 Bacteremia: Secondary | ICD-10-CM | POA: Diagnosis not present

## 2017-04-12 DIAGNOSIS — Z981 Arthrodesis status: Secondary | ICD-10-CM | POA: Diagnosis not present

## 2017-04-12 DIAGNOSIS — Z681 Body mass index (BMI) 19 or less, adult: Secondary | ICD-10-CM | POA: Diagnosis not present

## 2017-04-12 DIAGNOSIS — Z885 Allergy status to narcotic agent status: Secondary | ICD-10-CM | POA: Diagnosis not present

## 2017-04-12 DIAGNOSIS — Z8042 Family history of malignant neoplasm of prostate: Secondary | ICD-10-CM | POA: Diagnosis not present

## 2017-04-12 DIAGNOSIS — C259 Malignant neoplasm of pancreas, unspecified: Secondary | ICD-10-CM

## 2017-04-12 DIAGNOSIS — C784 Secondary malignant neoplasm of small intestine: Secondary | ICD-10-CM | POA: Diagnosis present

## 2017-04-12 DIAGNOSIS — A419 Sepsis, unspecified organism: Secondary | ICD-10-CM | POA: Diagnosis present

## 2017-04-12 DIAGNOSIS — D696 Thrombocytopenia, unspecified: Secondary | ICD-10-CM | POA: Diagnosis present

## 2017-04-12 DIAGNOSIS — Z85828 Personal history of other malignant neoplasm of skin: Secondary | ICD-10-CM

## 2017-04-12 DIAGNOSIS — E119 Type 2 diabetes mellitus without complications: Secondary | ICD-10-CM

## 2017-04-12 DIAGNOSIS — D509 Iron deficiency anemia, unspecified: Secondary | ICD-10-CM | POA: Diagnosis present

## 2017-04-12 DIAGNOSIS — Z8249 Family history of ischemic heart disease and other diseases of the circulatory system: Secondary | ICD-10-CM | POA: Diagnosis not present

## 2017-04-12 DIAGNOSIS — K831 Obstruction of bile duct: Secondary | ICD-10-CM

## 2017-04-12 DIAGNOSIS — C787 Secondary malignant neoplasm of liver and intrahepatic bile duct: Secondary | ICD-10-CM | POA: Diagnosis present

## 2017-04-12 DIAGNOSIS — B9689 Other specified bacterial agents as the cause of diseases classified elsewhere: Secondary | ICD-10-CM | POA: Diagnosis present

## 2017-04-12 DIAGNOSIS — E440 Moderate protein-calorie malnutrition: Secondary | ICD-10-CM | POA: Diagnosis present

## 2017-04-12 DIAGNOSIS — B961 Klebsiella pneumoniae [K. pneumoniae] as the cause of diseases classified elsewhere: Secondary | ICD-10-CM | POA: Diagnosis present

## 2017-04-12 DIAGNOSIS — C7989 Secondary malignant neoplasm of other specified sites: Secondary | ICD-10-CM | POA: Diagnosis not present

## 2017-04-12 DIAGNOSIS — Z833 Family history of diabetes mellitus: Secondary | ICD-10-CM | POA: Diagnosis not present

## 2017-04-12 LAB — BASIC METABOLIC PANEL
ANION GAP: 7 (ref 5–15)
BUN: 14 mg/dL (ref 6–20)
CHLORIDE: 98 mmol/L — AB (ref 101–111)
CO2: 26 mmol/L (ref 22–32)
Calcium: 8 mg/dL — ABNORMAL LOW (ref 8.9–10.3)
Creatinine, Ser: 0.74 mg/dL (ref 0.61–1.24)
GFR calc Af Amer: >60 mL/min (ref >60)
GFR calc non Af Amer: >60 mL/min (ref >60)
GLUCOSE: 194 mg/dL — AB (ref 65–99)
Potassium: 3.7 mmol/L (ref 3.5–5.1)
Sodium: 131 mmol/L — ABNORMAL LOW (ref 135–145)

## 2017-04-12 LAB — HEPATIC FUNCTION PANEL
ALT: 61 U/L (ref 17–63)
AST: 71 U/L — ABNORMAL HIGH (ref 15–41)
Albumin: 2.5 g/dL — ABNORMAL LOW (ref 3.5–5.0)
Alkaline Phosphatase: 937 U/L — ABNORMAL HIGH (ref 38–126)
BILIRUBIN INDIRECT: 1 mg/dL — AB (ref 0.3–0.9)
Bilirubin, Direct: 1.5 mg/dL — ABNORMAL HIGH (ref 0.1–0.5)
Total Bilirubin: 2.5 mg/dL — ABNORMAL HIGH (ref 0.3–1.2)
Total Protein: 5.6 g/dL — ABNORMAL LOW (ref 6.5–8.1)

## 2017-04-12 LAB — LACTIC ACID, PLASMA: LACTIC ACID, VENOUS: 4 mmol/L — AB (ref 0.5–1.9)

## 2017-04-12 LAB — CBC WITH DIFFERENTIAL/PLATELET
BASOS PCT: 0 %
Basophils Absolute: 0 10*3/uL (ref 0.0–0.1)
Eosinophils Absolute: 0 10*3/uL (ref 0.0–0.7)
Eosinophils Relative: 0 %
HEMATOCRIT: 21.7 % — AB (ref 39.0–52.0)
HEMOGLOBIN: 7.3 g/dL — AB (ref 13.0–17.0)
Lymphocytes Relative: 4 %
Lymphs Abs: 0.3 10*3/uL — ABNORMAL LOW (ref 0.7–4.0)
MCH: 30.3 pg (ref 26.0–34.0)
MCHC: 33.6 g/dL (ref 30.0–36.0)
MCV: 90 fL (ref 78.0–100.0)
MONOS PCT: 2 %
Monocytes Absolute: 0.1 10*3/uL (ref 0.1–1.0)
NEUTROS ABS: 7.6 10*3/uL (ref 1.7–7.7)
NEUTROS PCT: 94 %
Platelets: 123 10*3/uL — ABNORMAL LOW (ref 150–400)
RBC: 2.41 MIL/uL — ABNORMAL LOW (ref 4.22–5.81)
RDW: 17.8 % — ABNORMAL HIGH (ref 11.5–15.5)
WBC: 8.1 10*3/uL (ref 4.0–10.5)

## 2017-04-12 LAB — URINALYSIS, ROUTINE W REFLEX MICROSCOPIC
Bilirubin Urine: NEGATIVE
GLUCOSE, UA: 50 mg/dL — AB
Ketones, ur: NEGATIVE mg/dL
Leukocytes, UA: NEGATIVE
Nitrite: NEGATIVE
PH: 5 (ref 5.0–8.0)
Protein, ur: 30 mg/dL — AB
SPECIFIC GRAVITY, URINE: 1.019 (ref 1.005–1.030)
Squamous Epithelial / LPF: NONE SEEN

## 2017-04-12 LAB — PROTIME-INR
INR: 1.2
PROTHROMBIN TIME: 15.3 s — AB (ref 11.4–15.2)

## 2017-04-12 LAB — I-STAT CG4 LACTIC ACID, ED
Lactic Acid, Venous: 1.27 mmol/L (ref 0.5–1.9)
Lactic Acid, Venous: 9.17 mmol/L (ref 0.5–1.9)
Lactic Acid, Venous: 4.18 mmol/L (ref 0.5–1.9)

## 2017-04-12 LAB — GLUCOSE, CAPILLARY: Glucose-Capillary: 318 mg/dL — ABNORMAL HIGH (ref 65–99)

## 2017-04-12 LAB — CBG MONITORING, ED: GLUCOSE-CAPILLARY: 172 mg/dL — AB (ref 65–99)

## 2017-04-12 LAB — TSH: TSH: 24.043 u[IU]/mL — AB (ref 0.350–4.500)

## 2017-04-12 MED ORDER — HEPARIN SODIUM (PORCINE) 5000 UNIT/ML IJ SOLN
5000.0000 [IU] | Freq: Three times a day (TID) | INTRAMUSCULAR | Status: DC
  Administered 2017-04-12 – 2017-04-16 (×11): 5000 [IU] via SUBCUTANEOUS
  Filled 2017-04-12 (×11): qty 1

## 2017-04-12 MED ORDER — SODIUM CHLORIDE 0.9 % IV BOLUS (SEPSIS)
1000.0000 mL | Freq: Once | INTRAVENOUS | Status: AC
  Administered 2017-04-12: 1000 mL via INTRAVENOUS

## 2017-04-12 MED ORDER — INSULIN ASPART 100 UNIT/ML ~~LOC~~ SOLN
4.0000 [IU] | Freq: Every day | SUBCUTANEOUS | Status: DC
  Administered 2017-04-14: 4 [IU] via SUBCUTANEOUS

## 2017-04-12 MED ORDER — ACETAMINOPHEN 325 MG PO TABS
650.0000 mg | ORAL_TABLET | Freq: Four times a day (QID) | ORAL | Status: DC | PRN
  Administered 2017-04-13 – 2017-04-16 (×9): 650 mg via ORAL
  Filled 2017-04-12 (×9): qty 2

## 2017-04-12 MED ORDER — MORPHINE SULFATE (PF) 4 MG/ML IV SOLN: 1.0000 mg | INTRAVENOUS | Status: DC | PRN

## 2017-04-12 MED ORDER — ACETAMINOPHEN 650 MG RE SUPP: 650.0000 mg | Freq: Four times a day (QID) | RECTAL | Status: DC | PRN

## 2017-04-12 MED ORDER — VANCOMYCIN HCL IN DEXTROSE 750-5 MG/150ML-% IV SOLN
750.0000 mg | Freq: Two times a day (BID) | INTRAVENOUS | Status: DC
  Administered 2017-04-12 – 2017-04-13 (×2): 750 mg via INTRAVENOUS
  Filled 2017-04-12 (×2): qty 150

## 2017-04-12 MED ORDER — PIPERACILLIN-TAZOBACTAM 3.375 G IVPB 30 MIN
3.3750 g | Freq: Once | INTRAVENOUS | Status: AC
  Administered 2017-04-12: 3.375 g via INTRAVENOUS
  Filled 2017-04-12: qty 50

## 2017-04-12 MED ORDER — PROCHLORPERAZINE MALEATE 10 MG PO TABS: 10.0000 mg | ORAL_TABLET | Freq: Four times a day (QID) | ORAL | Status: DC | PRN

## 2017-04-12 MED ORDER — BACLOFEN 10 MG PO TABS: 5.0000 mg | ORAL_TABLET | Freq: Three times a day (TID) | ORAL | Status: DC | PRN

## 2017-04-12 MED ORDER — POLYETHYLENE GLYCOL 3350 17 G PO PACK: 17.0000 g | PACK | Freq: Every day | ORAL | Status: DC | PRN

## 2017-04-12 MED ORDER — INSULIN DETEMIR 100 UNIT/ML ~~LOC~~ SOLN
40.0000 [IU] | Freq: Every day | SUBCUTANEOUS | Status: DC
  Administered 2017-04-13: 25 [IU] via SUBCUTANEOUS
  Administered 2017-04-14: 40 [IU] via SUBCUTANEOUS
  Filled 2017-04-12 (×3): qty 0.4

## 2017-04-12 MED ORDER — IOPAMIDOL (ISOVUE-300) INJECTION 61%
INTRAVENOUS | Status: AC
  Filled 2017-04-12: qty 100

## 2017-04-12 MED ORDER — SODIUM CHLORIDE 0.9 % IV SOLN
INTRAVENOUS | Status: DC
  Administered 2017-04-13: 10:00:00 via INTRAVENOUS

## 2017-04-12 MED ORDER — INSULIN ASPART 100 UNIT/ML ~~LOC~~ SOLN
0.0000 [IU] | Freq: Three times a day (TID) | SUBCUTANEOUS | Status: DC
  Administered 2017-04-13: 5 [IU] via SUBCUTANEOUS
  Administered 2017-04-13: 3 [IU] via SUBCUTANEOUS
  Administered 2017-04-14: 2 [IU] via SUBCUTANEOUS
  Administered 2017-04-16: 1 [IU] via SUBCUTANEOUS
  Administered 2017-04-16: 3 [IU] via SUBCUTANEOUS

## 2017-04-12 MED ORDER — SODIUM CHLORIDE 0.9% FLUSH
3.0000 mL | Freq: Two times a day (BID) | INTRAVENOUS | Status: DC
  Administered 2017-04-12: 3 mL via INTRAVENOUS

## 2017-04-12 MED ORDER — ONDANSETRON HCL 4 MG/2ML IJ SOLN: 4.0000 mg | Freq: Four times a day (QID) | INTRAMUSCULAR | Status: DC | PRN

## 2017-04-12 MED ORDER — PIPERACILLIN-TAZOBACTAM 3.375 G IVPB
3.3750 g | Freq: Three times a day (TID) | INTRAVENOUS | Status: DC
  Administered 2017-04-12 – 2017-04-13 (×2): 3.375 g via INTRAVENOUS
  Filled 2017-04-12 (×3): qty 50

## 2017-04-12 MED ORDER — ONDANSETRON HCL 4 MG PO TABS: 4.0000 mg | ORAL_TABLET | Freq: Four times a day (QID) | ORAL | Status: DC | PRN

## 2017-04-12 MED ORDER — HYDROCODONE-ACETAMINOPHEN 5-325 MG PO TABS
1.0000 | ORAL_TABLET | ORAL | Status: DC | PRN
Start: 2017-04-12 — End: 2017-04-16

## 2017-04-12 MED ORDER — SODIUM CHLORIDE 0.9 % IV SOLN
1000.0000 mL | INTRAVENOUS | Status: DC
  Administered 2017-04-12 (×2): 1000 mL via INTRAVENOUS

## 2017-04-12 MED ORDER — SODIUM CHLORIDE 0.9% FLUSH
10.0000 mL | INTRAVENOUS | Status: DC | PRN
  Administered 2017-04-12 – 2017-04-13 (×2): 10 mL
  Filled 2017-04-12 (×2): qty 40

## 2017-04-12 MED ORDER — VANCOMYCIN HCL IN DEXTROSE 1-5 GM/200ML-% IV SOLN
1000.0000 mg | Freq: Once | INTRAVENOUS | Status: AC
  Administered 2017-04-12: 1000 mg via INTRAVENOUS
  Filled 2017-04-12: qty 200

## 2017-04-12 MED ORDER — INSULIN ASPART 100 UNIT/ML ~~LOC~~ SOLN
4.0000 [IU] | Freq: Once | SUBCUTANEOUS | Status: AC
Start: 2017-04-12 — End: 2017-04-12
  Administered 2017-04-12: 4 [IU] via SUBCUTANEOUS

## 2017-04-12 MED ORDER — IOPAMIDOL (ISOVUE-300) INJECTION 61%
75.0000 mL | Freq: Once | INTRAVENOUS | Status: AC | PRN
  Administered 2017-04-12: 75 mL via INTRAVENOUS

## 2017-04-12 MED ORDER — ACETAMINOPHEN 325 MG PO TABS
650.0000 mg | ORAL_TABLET | Freq: Once | ORAL | Status: AC
  Administered 2017-04-12: 650 mg via ORAL
  Filled 2017-04-12: qty 2

## 2017-04-12 NOTE — Progress Notes (Signed)
Pharmacy Antibiotic Note  Jacob Hardin is a 71 y.o. male with pancreatic cancer invading into the duodenum presented to the ED from home on 04/12/17 with c/o weaknesson and was found to be hypotensive.  To start broad abx with vancomycin and zosyn for suspected sepsis.   - 6/22 CXR: no acute findings - wbc wnl, scr 0.74 (crcl~78), LA 1.27   Plan: - Zosyn 3.375 gm IV x1 over 30 min, then 3.375 gm IV q8h (infuse over 4 hours) - Vancomycin 1000 mg IV x1, then 750 mg IV q12h  ____________________________  Height: 6' (182.9 cm) Weight: 140 lb (63.5 kg) IBW/kg (Calculated) : 77.6  Temp (24hrs), Avg:98.8 F (37.1 C), Min:98.8 F (37.1 C), Max:98.8 F (37.1 C)   Recent Labs Lab 04/12/17 0950 04/12/17 1000  WBC 8.1  --   CREATININE 0.74  --   LATICACIDVEN  --  1.27    Estimated Creatinine Clearance: 78.3 mL/min (by C-G formula based on SCr of 0.74 mg/dL).    Allergies  Allergen Reactions  . Codeine Nausea Only     Thank you for allowing pharmacy to be a part of this patient's care.  Lynelle Doctor 04/12/2017 12:17 PM

## 2017-04-12 NOTE — ED Notes (Signed)
BLOOD CULTURE X 1 5ML/EACH COLLECTED RT FOREARM

## 2017-04-12 NOTE — ED Notes (Signed)
PT UPPER GI TUESADY. DR.HUNG VIA ENDO CLEANED OUT CURRENT STENT IN STOMACH AND PLACED ADDITIONAL STENT IN STOMACH TO ASSIST WITH GASRTIC EMPYTING POSSIBLY CAUSED BY METS TO STOMACH.

## 2017-04-12 NOTE — ED Notes (Signed)
SPOKE WITH ADMITTING MD UPDATED ON PT'S CURRENT STATUS. MADE AWARE OF LACTIC ACID  VITAL SIGNS AND OVER ALL STATUS. MADE AWARE PT ASSISTED TO STANDING  WITH URINAL AND DENIED DIZZINESS. BP 95/60  HR  97, RR 16 POST PT WAS SITTING. PT STATED HE WAS FEELING BETTER. ORDER TO START 1 LITER NORMAL SALINE BEFORE TRANSFER

## 2017-04-12 NOTE — ED Notes (Signed)
Date and time results received: 04/12/17  (use smartphrase ".now" to insert current time)  Test: LACTIC ACID Critical Value: 9.17  Name of Provider Notified: EDP LIU  Orders Received? Or Actions Taken?: LACTIC PLASMA ORDERED

## 2017-04-12 NOTE — ED Provider Notes (Signed)
[facility] DEPT Provider Note   CSN: [id number] Arrival date & time: 04/12/17  5993     History   Chief Complaint Chief Complaint  Patient presents with  . Loss of Consciousness  . Hypotension  . Post-op Problem    HPI Jacob Hardin is a 71 y.o. male.  HPI   71 year old male with pancreatic CA diagnosed in Jan 2017, here with fever, chills and syncopal episode. Last chemo 04/01/17.  Recently released from hospital 2 days ago after an upper GI and duodenal stent placement done on that date as treatment for duodenal/biliary stricture.  Pt has not had pancreatic surgery.  Pt felt bad for the past week even before the procedure. He Report decrease in appetite and lack of energy.  Last night pt had fever, chills, sweats and 1 syncopal episode around 3am when pt was trying to get back to his bedroom from the bathroom.  He report feeling lightheadedness prior to his syncopal episode. He thought his blood sugar was low.  Denies any trauma or hitting head.  It was a witnessed fall, had LOC but likely <51min.  Wife did not notice any seizure activities aside from some tremors. Did report mild headache this AM but did take tylenol with improvement of sxs.  No confusion.  Does have chronic constipation/diarrhea 2/2 to chemo meds.  LBM was last night.  Denies CP, SOB, URI sxs, Abd pain, Urinary sxs, N/V, focal numbness or back pain. No rectal pain.          Past Medical History:  Diagnosis Date  . Elevated liver enzymes   . GERD (gastroesophageal reflux disease)   . Hyperlipidemia   . Hypertension    recently weight lost-no meds now-running low.  . Obstructive jaundice   . Oxalate nephropathy   . PONV (postoperative nausea and vomiting)   . Primary pancreatic adenocarcinoma (HCC)    mets to liver and duodenum  . Pruritus   . Restless legs   . Skin cancer    "burned off my face & head"  . Type II diabetes mellitus ([place])   . Weight loss     Patient Active Problem List   Diagnosis Date Noted  . Severe sepsis ([place]) 02/09/2017  . DM2 (diabetes mellitus, type 2) ([place]) 02/09/2017  . Pulmonary nodule, right 02/09/2017  . Biliary obstruction   . Obstructive jaundice   . GERD (gastroesophageal reflux disease) 10/12/2016  . Hyperbilirubinemia 10/12/2016  . Hypokalemia 10/12/2016  . Port catheter in place 02/14/2016  . Malnutrition of moderate degree 01/21/2016  . Immunocompromised patient ([place])   . Viridans streptococci infection   . Bacteremia due to Klebsiella pneumoniae   . Central line infection   . Metastatic cancer ([place])   . Sepsis ([place]) 01/17/2016  . SIRS (systemic inflammatory response syndrome) ([place]) 01/17/2016  . Cancer of head of pancreas ([place]) 11/24/2015  . Diabetes mellitus without complication ([place])   . Hyperlipidemia   . Hypertension     Past Surgical History:  Procedure Laterality Date  . ANTERIOR CERVICAL DECOMP/DISCECTOMY FUSION  1994   cervical-bone graft  . BACK SURGERY    . COLONOSCOPY    . DUODENAL STENT PLACEMENT N/A 11/22/2015   Procedure: DUODENAL STENT PLACEMENT;  Surgeon: Carol Ada, MD;  Location: [facility];  Service: Endoscopy;  Laterality: N/A;  . DUODENAL STENT PLACEMENT N/A 04/10/2017   Procedure: DUODENAL STENT PLACEMENT;  Surgeon: Carol Ada, MD;  Location: [facility] ENDOSCOPY;  Service: Endoscopy;  Laterality: N/A;  .  ERCP N/A 10/26/2016   Procedure: ENDOSCOPIC RETROGRADE CHOLANGIOPANCREATOGRAPHY (ERCP);  Surgeon: Carol Ada, MD;  Location: [facility] ENDOSCOPY;  Service: Endoscopy;  Laterality: N/A;  . ESOPHAGOGASTRODUODENOSCOPY N/A 11/18/2015   Procedure: ESOPHAGOGASTRODUODENOSCOPY (EGD);  Surgeon: Carol Ada, MD;  Location: [facility] ENDOSCOPY;  Service: Endoscopy;  Laterality: N/A;  . ESOPHAGOGASTRODUODENOSCOPY N/A 11/22/2015   Procedure: ESOPHAGOGASTRODUODENOSCOPY (EGD);  Surgeon: Carol Ada, MD;  Location: [facility] ENDOSCOPY;  Service: Endoscopy;  Laterality: N/A;  Uncovered duodenal stent placement.  Fluoroscopy required.  .  ESOPHAGOGASTRODUODENOSCOPY N/A 10/14/2016   Procedure: ESOPHAGOGASTRODUODENOSCOPY (EGD);  Surgeon: Carol Ada, MD;  Location: [facility] ENDOSCOPY;  Service: Endoscopy;  Laterality: N/A;  . ESOPHAGOGASTRODUODENOSCOPY (EGD) WITH PROPOFOL N/A 04/10/2017   Procedure: ESOPHAGOGASTRODUODENOSCOPY (EGD) WITH PROPOFOL;  Surgeon: Carol Ada, MD;  Location: [facility] ENDOSCOPY;  Service: Endoscopy;  Laterality: N/A;  . EUS  11/18/2015   Procedure: UPPER ENDOSCOPIC ULTRASOUND (EUS) LINEAR;  Surgeon: Carol Ada, MD;  Location: [facility];  Service: Endoscopy;;  . INGUINAL HERNIA REPAIR Left   . IR CONVERT BILIARY DRAIN TO INT EXT BILIARY DRAIN  03/20/2017  . IR EXCHANGE BILIARY DRAIN  02/12/2017  . IR EXCHANGE BILIARY DRAIN  03/12/2017  . IR GENERIC HISTORICAL  10/16/2016   IR BILIARY DRAIN PLACEMENT WITH CHOLANGIOGRAM 10/16/2016 Marybelle Killings, MD [facility]-INTERV RAD  . IR GENERIC HISTORICAL  11/07/2016   IR EXCHANGE BILIARY DRAIN 11/07/2016 Arne Cleveland, MD [facility]  . IR GENERIC HISTORICAL  11/12/2016   IR PATIENT EVAL TECH 0-60 MINS 11/12/2016 Arne Cleveland, MD [facility]INTER[facility]  . SHOULDER ARTHROSCOPY W/ ROTATOR CUFF REPAIR Left 2014  . TONSILLECTOMY    . WISDOM TOOTH EXTRACTION         Home Medications    Prior to Admission medications   Medication Sig Start Date End Date Taking? Authorizing Provider  acetaminophen (TYLENOL) 500 MG tablet Take 1,000 mg by mouth every 6 (six) hours as needed.   Yes [provider]  baclofen (LIORESAL) 10 MG tablet Take 5 mg by mouth 3 (three) times daily as needed (for hiccups).   Yes [provider]  ibuprofen (ADVIL,MOTRIN) 200 MG tablet Take 400 mg by mouth every 6 (six) hours as needed.   Yes [provider]  insulin aspart (NOVOLOG FLEXPEN) 100 UNIT/ML FlexPen Inject 4 Units into the skin daily before supper.   Yes [provider]  Insulin Detemir (LEVEMIR FLEXTOUCH) 100 UNIT/ML Pen Inject 40 Units into the skin daily. 03/21/17  Yes  [name], MD  lidocaine-prilocaine (EMLA) cream Apply 1 application topically as needed (prior to accessing port).   Yes [provider]  lipase/protease/amylase (CREON) 36000 UNITS CPEP capsule Take 1 capsule (36,000 Units total) by mouth 3 (three) times daily before meals. 02/22/17  Yes Ladell Pier, MD  loratadine (CLARITIN) 10 MG tablet Take 10 mg by mouth daily.    Yes [provider]  Multiple Vitamin (MULTIVITAMIN WITH MINERALS) TABS tablet Take 1 tablet by mouth daily.   Yes [provider]  ondansetron (ZOFRAN) 8 MG tablet Take 1 tablet (8 mg total) by mouth every 8 (eight) hours as needed for nausea or vomiting. 03/05/17  Yes Owens Shark, NP  polyethylene glycol (MIRALAX / GLYCOLAX) packet Take 17 g by mouth daily as needed for mild constipation.    Yes [provider]  prochlorperazine (COMPAZINE) 10 MG tablet Take 1 tablet (10 mg total) by mouth every 6 (six) hours as needed for nausea or vomiting. 03/05/17  Yes Owens Shark, NP  BD PEN NEEDLE NANO U/F 32G X 4 MM MISC USE TO INJECT INSULIN TWICE A DAY 11/06/16   [name], MD  ciprofloxacin (CIPRO) 500 MG tablet Take 1 tablet (500 mg total) by mouth 2 (two) times daily. Patient not taking: Reported on 04/12/2017 04/01/17   Owens Shark, NP  hydrOXYzine (ATARAX/VISTARIL) 25 MG tablet Take 1 tablet (25 mg total) by mouth every 8 (eight) hours as needed for itching. Patient not taking: Reported on 04/12/2017 12/06/16   Ladell Pier, MD  traMADol (ULTRAM) 50 MG tablet Take 1 tablet (50 mg total) by mouth every 6 (six) hours as needed. Patient not taking: Reported on 04/12/2017 02/15/17   Theodis Blaze, MD    Family History Family History  Problem Relation Age of Onset  . Diabetes Mother   . Hypertension Mother   . Cancer Father        lung 98  . Hypertension Father   . Cancer Brother        pancreatic   . Diabetes Brother   . Cancer Paternal Grandfather        colon  .  Cancer Paternal Uncle        prostate    Social History Social History  Substance Use Topics  . Smoking status: Never Smoker  . Smokeless tobacco: Never Used  . Alcohol use No     Allergies   Codeine   Review of Systems Review of Systems  All other systems reviewed and are negative.    Physical Exam Updated Vital Signs BP (!) 90/56   Pulse 78   Temp 98.8 F (37.1 C) (Oral)   Resp 16   Ht 6' (1.829 m)   Wt 63.5 kg (140 lb)   SpO2 98%   BMI 18.99 kg/m   Physical Exam  Constitutional:  Cachectic appearing male laying in bed in no acute discomfort, nontoxic  Eyes:  Conjunctiva are pale  Neck: Normal range of motion. Neck supple.  No nuchal rigidity  Cardiovascular: Normal rate and regular rhythm.   Pulmonary/Chest: Effort normal and breath sounds normal. He has no wheezes. He has no rales.  Abdominal: Soft. Bowel sounds are normal.  Biliary stents on the right side of abdomen with normals skin appearance.  Nursing note and vitals reviewed.    ED Treatments / Results  Labs (all labs ordered are listed, but only abnormal results are displayed) Labs Reviewed  BASIC METABOLIC PANEL - Abnormal; Notable for the following:       Result Value   Sodium 131 (*)    Chloride 98 (*)    Glucose, Bld 194 (*)    Calcium 8.0 (*)    All other components within normal limits  CBC WITH DIFFERENTIAL/PLATELET - Abnormal; Notable for the following:    RBC 2.41 (*)    Hemoglobin 7.3 (*)    HCT 21.7 (*)    RDW 17.8 (*)    Platelets 123 (*)    Lymphs Abs 0.3 (*)    All other components within normal limits  I-STAT CG4 LACTIC ACID, ED - Abnormal; Notable for the following:    Lactic Acid, Venous 9.17 (*)    All other components within normal limits  CULTURE, BLOOD (ROUTINE X 2)  CULTURE, BLOOD (ROUTINE X 2)  URINE CULTURE  URINALYSIS, ROUTINE W REFLEX MICROSCOPIC  HEPATIC FUNCTION PANEL  LACTIC ACID, PLASMA  LACTIC ACID, PLASMA  CBG MONITORING, ED  I-STAT CG4 LACTIC  ACID, ED  I-STAT  CG4 LACTIC ACID, ED  I-STAT CG4 LACTIC ACID, ED  TYPE AND SCREEN    EKG  EKG Interpretation None       Radiology Dg Abd 1 View - Kub  Result Date: 04/10/2017 CLINICAL DATA:  Duodenum stent. EXAM: ABDOMEN - 1 VIEW COMPARISON:  04/09/2017 FINDINGS: Two duodenum stents noted projected over the proximal duodenum. Stent position appears stable from prior exam. Biliary stent noted. Traversing biliary stent is a biliary drainage catheter. Oral contrast in the colon. No bowel distention. Degenerative changes thoracolumbar spine . IMPRESSION: Two duodenum stents noted projected the proximal duodenum. Biliary stent and biliary drainage tube noted. Oral contrast in the colon. Electronically Signed   By: Marcello Moores  Register   On: 04/10/2017 14:05    Procedures Procedures (including critical care time)  Medications Ordered in ED Medications  0.9 %  sodium chloride infusion (1,000 mLs Intravenous New Bag/Given 04/12/17 1259)  vancomycin (VANCOCIN) IVPB 750 mg/150 ml premix (not administered)  piperacillin-tazobactam (ZOSYN) IVPB 3.375 g (not administered)  acetaminophen (TYLENOL) tablet 650 mg (not administered)  iopamidol (ISOVUE-300) 61 % injection (not administered)  sodium chloride 0.9 % bolus 1,000 mL (1,000 mLs Intravenous New Bag/Given 04/12/17 1300)    And  sodium chloride 0.9 % bolus 1,000 mL (0 mLs Intravenous Stopped 04/12/17 1202)  piperacillin-tazobactam (ZOSYN) IVPB 3.375 g (0 g Intravenous Stopped 04/12/17 1159)  vancomycin (VANCOCIN) IVPB 1000 mg/200 mL premix (0 mg Intravenous Stopped 04/12/17 1237)  iopamidol (ISOVUE-300) 61 % injection 75 mL (75 mLs Intravenous Contrast Given 04/12/17 1453)     Initial Impression / Assessment and Plan / ED Course  I have reviewed the triage vital signs and the nursing notes.  Pertinent labs & imaging results that were available during my care of the patient were reviewed by me and considered in my medical decision making (see  chart for details).     BP (!) 145/111   Pulse (!) 116   Temp 99.5 F (37.5 C) (Oral)   Resp (!) 30   Ht 6' (1.829 m)   Wt 63.5 kg (140 lb)   SpO2 94%   BMI 18.99 kg/m    Final Clinical Impressions(s) / ED Diagnoses   Final diagnoses:  Sepsis, due to unspecified organism [facility])    New Prescriptions New Prescriptions   No medications on file    2:56 PM This is a patient with history of pancreatic cancer, currently receiving chemotherapy who is here with symptoms concerning for sepsis of unknown source. He has a cemented fever of 103 according to EMS, was hypotensive initially with blood pressure in the 35T systolic and had a syncopal episode earlier today. He is in no acute distress and not complaining of any pain however he is having some rhinitis. His initial lactic acid was normal at 1.27 however 4 hours later his lactic acid increased to 9.17. Will have it rechecked. He has normal white count. Hemoglobin is 7.3, near his baseline. He denies any abnormal bleeding. Chest x-ray is unremarkable. He did had a biliary stent placement performed by Dr. Carol Ada 2 days ago. Although he denies any significant abdominal pain, there is a concern for cholangitis after stent placement. Appreciate consultation from hospitalist, Dr. Hartford Poli who agrees to see pt in the ER and will admit for further care.  He does recommend abd/pelvis CT.  Pt currently receiving broad spectrum abx and fluid resuscitation at 65ml/kg.    ADDENDUM: pt had a duodenal stent instead of biliary stent.  CRITICAL CARE Performed by: Domenic Moras Total critical care time: 35 minutes Critical care time was exclusive of separately billable procedures and treating other patients. Critical care was necessary to treat or prevent imminent or life-threatening deterioration. Critical care was time spent personally by me on the following activities: development of treatment plan with patient and/or surrogate as well as  nursing, discussions with consultants, evaluation of patient's response to treatment, examination of patient, obtaining history from patient or surrogate, ordering and performing treatments and interventions, ordering and review of laboratory studies, ordering and review of radiographic studies, pulse oximetry and re-evaluation of patient's condition.    Domenic Moras, PA-C 04/12/17 [place], MD 04/13/17 [phone]

## 2017-04-12 NOTE — ED Notes (Signed)
EDPA Provider at bedside. UPDATED ON PT CURRENT STATUS

## 2017-04-12 NOTE — ED Notes (Signed)
Bed: WA21 Expected date:  Expected time:  Means of arrival:  Comments: EMS- hypotension 

## 2017-04-12 NOTE — ED Notes (Signed)
AWARE OF NEED FOR URINE 

## 2017-04-12 NOTE — H&P (Signed)
History and Physical    Jacob Hardin ZOX:[id number] DOB: 29-Jan-1946 DOA: 04/12/2017  PCP: [name], MD  Patient coming from: Home  Chief Complaint: Fever  HPI: Jacob Hardin is a 71 y.o. male with medical history significant of metastatic pancreatic cancer, came into the hospital because of fever. Patient has recent endoscopy with the pancreatic stent placed on 04/10/2017, after he went home he felt okay but earlier today he developed fever, weakness so he came into the hospital for further evaluation. Patient reported having same symptoms and sepsis/bacteremia with previous stent change. In the ED patient temp is 102.6, heart rate is 106 and lactic acid up to 9.1.  ED Course:  Vitals: Temp 102.6, heart rate 106 Labs: Lactic acid of 9.1, hemoglobin 7.3, sodium 131 Imaging: CT scan showed progression of hepatic metastases, new fullness involving the main pancreatic duct and placement of new overlapping duodenal stent. Interventions: Started on IV Zosyn and IV vancomycin  Review of Systems:  12 point review of systems are negative except for the symptoms mentioned in the history of present illness  Past Medical History:  Diagnosis Date  . Elevated liver enzymes   . GERD (gastroesophageal reflux disease)   . Hyperlipidemia   . Hypertension    recently weight lost-no meds now-running low.  . Obstructive jaundice   . Oxalate nephropathy   . PONV (postoperative nausea and vomiting)   . Primary pancreatic adenocarcinoma (HCC)    mets to liver and duodenum  . Pruritus   . Restless legs   . Skin cancer    "burned off my face & head"  . Type II diabetes mellitus ([place])   . Weight loss     Past Surgical History:  Procedure Laterality Date  . ANTERIOR CERVICAL DECOMP/DISCECTOMY FUSION  1994   cervical-bone graft  . BACK SURGERY    . COLONOSCOPY    . DUODENAL STENT PLACEMENT N/A 11/22/2015   Procedure: DUODENAL STENT PLACEMENT;  Surgeon: Carol Ada, MD;  Location:  [facility];  Service: Endoscopy;  Laterality: N/A;  . DUODENAL STENT PLACEMENT N/A 04/10/2017   Procedure: DUODENAL STENT PLACEMENT;  Surgeon: Carol Ada, MD;  Location: [facility] ENDOSCOPY;  Service: Endoscopy;  Laterality: N/A;  . ERCP N/A 10/26/2016   Procedure: ENDOSCOPIC RETROGRADE CHOLANGIOPANCREATOGRAPHY (ERCP);  Surgeon: Carol Ada, MD;  Location: [facility] ENDOSCOPY;  Service: Endoscopy;  Laterality: N/A;  . ESOPHAGOGASTRODUODENOSCOPY N/A 11/18/2015   Procedure: ESOPHAGOGASTRODUODENOSCOPY (EGD);  Surgeon: Carol Ada, MD;  Location: [facility] ENDOSCOPY;  Service: Endoscopy;  Laterality: N/A;  . ESOPHAGOGASTRODUODENOSCOPY N/A 11/22/2015   Procedure: ESOPHAGOGASTRODUODENOSCOPY (EGD);  Surgeon: Carol Ada, MD;  Location: [facility] ENDOSCOPY;  Service: Endoscopy;  Laterality: N/A;  Uncovered duodenal stent placement.  Fluoroscopy required.  . ESOPHAGOGASTRODUODENOSCOPY N/A 10/14/2016   Procedure: ESOPHAGOGASTRODUODENOSCOPY (EGD);  Surgeon: Carol Ada, MD;  Location: [facility];  Service: Endoscopy;  Laterality: N/A;  . ESOPHAGOGASTRODUODENOSCOPY (EGD) WITH PROPOFOL N/A 04/10/2017   Procedure: ESOPHAGOGASTRODUODENOSCOPY (EGD) WITH PROPOFOL;  Surgeon: Carol Ada, MD;  Location: [facility] ENDOSCOPY;  Service: Endoscopy;  Laterality: N/A;  . EUS  11/18/2015   Procedure: UPPER ENDOSCOPIC ULTRASOUND (EUS) LINEAR;  Surgeon: Carol Ada, MD;  Location: [place];  Service: Endoscopy;;  . INGUINAL HERNIA REPAIR Left   . IR CONVERT BILIARY DRAIN TO INT EXT BILIARY DRAIN  03/20/2017  . IR EXCHANGE BILIARY DRAIN  02/12/2017  . IR EXCHANGE BILIARY DRAIN  03/12/2017  . IR GENERIC HISTORICAL  10/16/2016   IR BILIARY DRAIN PLACEMENT WITH CHOLANGIOGRAM 10/16/2016 Marybelle Killings,  MD [facility]  . IR GENERIC HISTORICAL  11/07/2016   IR EXCHANGE BILIARY DRAIN 11/07/2016 Arne Cleveland, MD [facility]  . IR GENERIC HISTORICAL  11/12/2016   IR PATIENT EVAL TECH 0-60 MINS 11/12/2016 Arne Cleveland, MD [facility]  . SHOULDER  ARTHROSCOPY W/ ROTATOR CUFF REPAIR Left 2014  . TONSILLECTOMY    . WISDOM TOOTH EXTRACTION       reports that he has never smoked. He has never used smokeless tobacco. He reports that he does not drink alcohol or use drugs.  Allergies  Allergen Reactions  . Codeine Nausea Only    Family History  Problem Relation Age of Onset  . Diabetes Mother   . Hypertension Mother   . Cancer Father        lung 96  . Hypertension Father   . Cancer Brother        pancreatic   . Diabetes Brother   . Cancer Paternal Grandfather        colon  . Cancer Paternal Uncle        prostate    Prior to Admission medications   Medication Sig Start Date End Date Taking? Authorizing Provider  acetaminophen (TYLENOL) 500 MG tablet Take 1,000 mg by mouth every 6 (six) hours as needed.   Yes [provider]  baclofen (LIORESAL) 10 MG tablet Take 5 mg by mouth 3 (three) times daily as needed (for hiccups).   Yes [provider]  ibuprofen (ADVIL,MOTRIN) 200 MG tablet Take 400 mg by mouth every 6 (six) hours as needed.   Yes [provider]  insulin aspart (NOVOLOG FLEXPEN) 100 UNIT/ML FlexPen Inject 4 Units into the skin daily before supper.   Yes [provider]  Insulin Detemir (LEVEMIR FLEXTOUCH) 100 UNIT/ML Pen Inject 40 Units into the skin daily. 03/21/17  Yes [name], MD  lidocaine-prilocaine (EMLA) cream Apply 1 application topically as needed (prior to accessing port).   Yes [provider]  lipase/protease/amylase (CREON) 36000 UNITS CPEP capsule Take 1 capsule (36,000 Units total) by mouth 3 (three) times daily before meals. 02/22/17  Yes Ladell Pier, MD  loratadine (CLARITIN) 10 MG tablet Take 10 mg by mouth daily.    Yes [provider]  Multiple Vitamin (MULTIVITAMIN WITH MINERALS) TABS tablet Take 1 tablet by mouth daily.   Yes [provider]  ondansetron (ZOFRAN) 8 MG tablet Take 1 tablet (8 mg total) by mouth every 8  (eight) hours as needed for nausea or vomiting. 03/05/17  Yes Owens Shark, NP  polyethylene glycol (MIRALAX / GLYCOLAX) packet Take 17 g by mouth daily as needed for mild constipation.    Yes [provider]  prochlorperazine (COMPAZINE) 10 MG tablet Take 1 tablet (10 mg total) by mouth every 6 (six) hours as needed for nausea or vomiting. 03/05/17  Yes Owens Shark, NP  BD PEN NEEDLE NANO U/F 32G X 4 MM MISC USE TO INJECT INSULIN TWICE A DAY 11/06/16   [name], MD  ciprofloxacin (CIPRO) 500 MG tablet Take 1 tablet (500 mg total) by mouth 2 (two) times daily. Patient not taking: Reported on 04/12/2017 04/01/17   Owens Shark, NP  hydrOXYzine (ATARAX/VISTARIL) 25 MG tablet Take 1 tablet (25 mg total) by mouth every 8 (eight) hours as needed for itching. Patient not taking: Reported on 04/12/2017 12/06/16   Ladell Pier, MD  traMADol (ULTRAM) 50 MG tablet Take 1 tablet (50 mg total) by mouth  every 6 (six) hours as needed. Patient not taking: Reported on 04/12/2017 02/15/17   Theodis Blaze, MD    Physical Exam:  Vitals:   04/12/17 1522 04/12/17 1545 04/12/17 1600 04/12/17 1648  BP: 134/70 118/62 119/82 (!) 96/50  Pulse: (!) 104 (!) 105 (!) 108 99  Resp: 20 15 20 19   Temp:      TempSrc:      SpO2: 97% 98% 100% 99%  Weight:      Height:        Constitutional: NAD, calm, comfortable Eyes: PERRL, lids and conjunctivae normal ENMT: Mucous membranes are moist. Posterior pharynx clear of any exudate or lesions.Normal dentition.  Neck: normal, supple, no masses, no thyromegaly Respiratory: clear to auscultation bilaterally, no wheezing, no crackles. Normal respiratory effort. No accessory muscle use.  Cardiovascular: Regular rate and rhythm, no murmurs / rubs / gallops. No extremity edema. 2+ pedal pulses. No carotid bruits.  Abdomen: no tenderness, no masses palpated. No hepatosplenomegaly. Bowel sounds positive.  Musculoskeletal: no clubbing / cyanosis. No joint deformity  upper and lower extremities. Good ROM, no contractures. Normal muscle tone.  Skin: no rashes, lesions, ulcers. No induration Neurologic: CN 2-12 grossly intact. Sensation intact, DTR normal. Strength 5/5 in all 4.  Psychiatric: Normal judgment and insight. Alert and oriented x 3. Normal mood.   Labs on Admission: I have personally reviewed following labs and imaging studies  CBC:  Recent Labs Lab 04/12/17 0950  WBC 8.1  NEUTROABS 7.6  HGB 7.3*  HCT 21.7*  MCV 90.0  PLT 419*   Basic Metabolic Panel:  Recent Labs Lab 04/12/17 0950  NA 131*  K 3.7  CL 98*  CO2 26  GLUCOSE 194*  BUN 14  CREATININE 0.74  CALCIUM 8.0*   GFR: Estimated Creatinine Clearance: 78.3 mL/min (by C-G formula based on SCr of 0.74 mg/dL). Liver Function Tests:  Recent Labs Lab 04/12/17 1526  AST 71*  ALT 61  ALKPHOS 937*  BILITOT 2.5*  PROT 5.6*  ALBUMIN 2.5*   No results for input(s): LIPASE, AMYLASE in the last 168 hours. No results for input(s): AMMONIA in the last 168 hours. Coagulation Profile: No results for input(s): INR, PROTIME in the last 168 hours. Cardiac Enzymes: No results for input(s): CKTOTAL, CKMB, CKMBINDEX, TROPONINI in the last 168 hours. BNP (last 3 results) No results for input(s): PROBNP in the last 8760 hours. HbA1C: No results for input(s): HGBA1C in the last 72 hours. CBG:  Recent Labs Lab 04/10/17 1221  GLUCAP 148*   Lipid Profile: No results for input(s): CHOL, HDL, LDLCALC, TRIG, CHOLHDL, LDLDIRECT in the last 72 hours. Thyroid Function Tests: No results for input(s): TSH, T4TOTAL, FREET4, T3FREE, THYROIDAB in the last 72 hours. Anemia Panel: No results for input(s): VITAMINB12, FOLATE, FERRITIN, TIBC, IRON, RETICCTPCT in the last 72 hours. Urine analysis:    Component Value Date/Time   COLORURINE YELLOW 04/12/2017 1609   APPEARANCEUR CLEAR 04/12/2017 1609   LABSPEC 1.019 04/12/2017 1609   PHURINE 5.0 04/12/2017 1609   GLUCOSEU 50 (A)  04/12/2017 1609   HGBUR SMALL (A) 04/12/2017 1609   BILIRUBINUR NEGATIVE 04/12/2017 1609   KETONESUR NEGATIVE 04/12/2017 1609   PROTEINUR 30 (A) 04/12/2017 1609   NITRITE NEGATIVE 04/12/2017 1609   LEUKOCYTESUR NEGATIVE 04/12/2017 1609   Sepsis Labs: !!!!!!!!!!!!!!!!!!!!!!!!!!!!!!!!!!!!!!!!!!!! Invalid input(s): PROCALCITONIN, LACTICIDVEN No results found for this or any previous visit (from the past 240 hour(s)).   Radiological Exams on Admission: Ct Abdomen Pelvis W Contrast  Result  Date: 04/12/2017 CLINICAL DATA:  Pancreatic cancer with duodenal invasion. Now with weakness and hypotension. EXAM: CT ABDOMEN AND PELVIS WITH CONTRAST TECHNIQUE: Multidetector CT imaging of the abdomen and pelvis was performed using the standard protocol following bolus administration of intravenous contrast. CONTRAST:  26mL ISOVUE-300 IOPAMIDOL (ISOVUE-300) INJECTION 61% COMPARISON:  10/13/2016 FINDINGS: Lower chest:  Compressive atelectasis right lung base. Hepatobiliary: Multiple new and enlarging liver metastases are evident. Liver metastases range in size from about 5 mm up to 1 of the more dominant lesions identified in the inferior right liver measuring 3.9 cm (image 35 series 2). Internal external biliary drain is identified, coursing through the common bile duct stent which appears to be in stable positions since prior study. Pancreas: Pancreatic parenchyma is diffusely atrophic and mild diffuse dilatation of the main pancreatic duct is evident. Fullness in the region the pancreatic head is stable. Interval development of thickening in the region of the pancreatic tail now measuring 2.5 cm in AP diameter compared to 1.4 cm previously. Spleen: No splenomegaly. No focal mass lesion. Adrenals/Urinary Tract: No adrenal nodule or mass. Kidneys are unremarkable. No hydronephrosis. No evidence for hydroureter. Urinary bladder is moderately distended. Stomach/Bowel: Stomach is nondilated. Duodenal stent again  identified with interval placement of a more proximal overlapping stent in the distal stomach. No evidence for small bowel dilatation to suggest obstruction. Terminal ileum unremarkable. Appendix is normal in appearance. Colon is nondilated with contrast material seen extending from the ascending colon through to the level of the rectum. Vascular/Lymphatic: The portal vein and superior mesenteric vein are patent. Splenic vein patency cannot be confirmed. There is abdominal aortic atherosclerosis without aneurysm. No gastrohepatic or hepatoduodenal ligament lymphadenopathy. No retroperitoneal lymphadenopathy No pelvic sidewall lymphadenopathy. Reproductive: The prostate gland and seminal vesicles have normal imaging features. Other: Small volume intraperitoneal free fluid noted. Musculoskeletal: Bone windows reveal no worrisome lytic or sclerotic osseous lesions. IMPRESSION: 1. Interval progression of hepatic metastases. 2. Features suggest interval progression of main pancreatic ductal dilatation with new soft tissue fullness identified in the pancreatic tail region. 3. Pole vein and superior mesenteric vein remain patent. Patency of the splenic vein cannot be confirmed on today's study. 4. Interval placement of an overlapping distal gastric stent proximal to the pre-existing duodenal stent. 5. Interval percutaneous internal external biliary drain placement. No substantial biliary dilatation on today's study. Electronically Signed   By: Misty Stanley M.D.   On: 04/12/2017 15:44   Dg Chest Port 1 View  Result Date: 04/12/2017 CLINICAL DATA:  Weakness.  Possible sepsis. EXAM: PORTABLE CHEST 1 VIEW COMPARISON:  02/09/2017 FINDINGS: Right Port-A-Cath which terminates at the low SVC. Right hemidiaphragm elevation is moderate. Midline trachea. Normal heart size. Atherosclerosis in the transverse aorta. No pleural effusion or pneumothorax. Clear lungs. IMPRESSION: No acute cardiopulmonary disease. Aortic  Atherosclerosis (ICD10-I70.0). Electronically Signed   By: Abigail Miyamoto M.D.   On: 04/12/2017 11:51    EKG: Independently reviewed.   Assessment/Plan Principal Problem:   Sepsis ([place]) Active Problems:   Diabetes mellitus without complication ([place])   Hypertension   Primary pancreatic cancer with metastasis to other site [facility])   Malnutrition of moderate degree    Sepsis -Presented with a temperature of 102.6, heart rate of 106 and suspected infection. -Evidence of organ damage with lactic acid of 9.1. -Started on aggressive hydration with IV fluids and IV antibiotics. -Blood and urine cultures obtained. Had previous episode after placement of gastric stent.  Metastatic pancreatic cancer -With metastases to the liver, fungating  mass in the duodenal bulb required enteral stent. -Patient follows with Dr. [name].  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 -Patient follows with Dr. [name]. Duodenal stent placed on 04/10/2017. -We will courtesy notify Dr. [name] in the morning.  Diabetes mellitus type 2 -Restart home insulin both lung and short-acting. -Carb modified diet and place on SSI.  Malnutrition of moderate degree -Associated with malignancy RD to evaluate.   DVT prophylaxis: SQ Heparin Code Status: DNR/DNI Family Communication: Plan D/W patient and wife at bedside Disposition Plan: Home Consults called:   Admission status: Inpatient   [facility] A MD [facility] Pager [phone]  If 7PM-7AM, please contact night-coverage [email or web address] Password [facility]  04/12/2017, 4:57 PM

## 2017-04-12 NOTE — ED Triage Notes (Addendum)
Pt from home via EMS- Pt reports that he has been "feeling week" for the past couple days. Pt reports a syncopal episode around 3am. Pt denies injury or head trauma. Pt sts he took 1g Tylenol and 400 advil approx 2 hrs PTA for fever/ EMS repots that pt is orthostatic with systolic drop of over 20 points upon standing. Pt is A&O and in NAD. Pt VS 104/59, 99% RA, HR 81, RR 16, CBG 223, 103.1 temp. Pt denies CP or SOB. Pt received 500cc NS en route. Pt has hx of Pancreatic CA

## 2017-04-12 NOTE — ED Notes (Signed)
ED Provider at bedside. 

## 2017-04-12 NOTE — ED Notes (Signed)
PT REQUESTING TO SIT ON SIDE OF BED. WIFE PRESENT. BOTH ENCOURAGED TO CALL FOR ASSISTANCE

## 2017-04-12 NOTE — ED Notes (Signed)
WIFE WITNESSED SEIZURE LIKE ACTIVITY LAST NIGHT AT 0300. NO HX.THOUGHT ? LOW SUGAR. PT PRESENTED CLAMMY AND PALE.  HOWEVER CBG 200. HUSBAND HAD NO MEMORY OF EVENT. PT THIS AM WENT DOWN TO KNEE AND WENT BACKWARDS WITNESSED BY WIFE. NO MEMORY OF THIS EVENT.

## 2017-04-12 NOTE — Progress Notes (Signed)
Pt. HS CBG 318. No HS insulin correction orders. On call MD Olevia Bowens paged and made aware. Will continue to monitor.

## 2017-04-12 NOTE — ED Notes (Signed)
REMOVED SEVERAL OF THE MULTIPLE BLANKETS ON PT.  PT STATES HE FEELS BETTER.

## 2017-04-12 NOTE — ED Notes (Signed)
REPORT GIVEN AT [place] RN INFORMED PT CURRENT VITALS. PORTA-CATH ACCESSED. 5TH LITER INFUSING. PT VOIDING VIA URINAL. PT WITHOUT COMPLAINTS. ADMITTING MD AWARE OF PT CURRENT STATUS

## 2017-04-12 NOTE — ED Notes (Signed)
PT DECLINES RECTAL TEMP.

## 2017-04-12 NOTE — ED Notes (Signed)
Patient transported to CT 

## 2017-04-12 NOTE — ED Notes (Addendum)
SPOKE WITH CHARGE TARA RN. DELAY IN TRANSFER. HOLDING UNTIL RESULTS FROM LACTIC PLASMA AND CONFIRM WITH EDP PT IS APPROPRIATE FOR FLOOR. CHARGE STACEY RN MADE AWARE.-[phone] RN TO CALL IF RECEIVING

## 2017-04-12 NOTE — ED Notes (Signed)
ADVIL AND TYLENOL TAKEN BEFORE ARRIVAL

## 2017-04-12 NOTE — ED Notes (Signed)
Family at bedside. 

## 2017-04-12 NOTE — ED Notes (Signed)
PT STATES CHILLS AND SHAKING, INCREASED RR AND HR  ENCOURAGED TO SLOW BREATHING AND SHAKING. PT ABLE TO DO SO WITH ENCOURAGEMENT FROM WIFE AND THIS WRITER

## 2017-04-13 DIAGNOSIS — E440 Moderate protein-calorie malnutrition: Secondary | ICD-10-CM

## 2017-04-13 DIAGNOSIS — A419 Sepsis, unspecified organism: Secondary | ICD-10-CM

## 2017-04-13 DIAGNOSIS — E119 Type 2 diabetes mellitus without complications: Secondary | ICD-10-CM

## 2017-04-13 DIAGNOSIS — C259 Malignant neoplasm of pancreas, unspecified: Secondary | ICD-10-CM

## 2017-04-13 LAB — BLOOD CULTURE ID PANEL (REFLEXED)
ACINETOBACTER BAUMANNII: NOT DETECTED
CANDIDA ALBICANS: NOT DETECTED
CANDIDA GLABRATA: NOT DETECTED
CANDIDA KRUSEI: NOT DETECTED
CANDIDA TROPICALIS: NOT DETECTED
Candida parapsilosis: NOT DETECTED
Carbapenem resistance: NOT DETECTED
ENTEROBACTER CLOACAE COMPLEX: DETECTED — AB
ESCHERICHIA COLI: NOT DETECTED
Enterobacteriaceae species: DETECTED — AB
Enterococcus species: NOT DETECTED
Haemophilus influenzae: NOT DETECTED
KLEBSIELLA OXYTOCA: NOT DETECTED
Klebsiella pneumoniae: DETECTED — AB
LISTERIA MONOCYTOGENES: NOT DETECTED
NEISSERIA MENINGITIDIS: NOT DETECTED
PROTEUS SPECIES: NOT DETECTED
Pseudomonas aeruginosa: NOT DETECTED
SERRATIA MARCESCENS: NOT DETECTED
STAPHYLOCOCCUS SPECIES: NOT DETECTED
STREPTOCOCCUS AGALACTIAE: NOT DETECTED
Staphylococcus aureus (BCID): NOT DETECTED
Streptococcus pneumoniae: NOT DETECTED
Streptococcus pyogenes: NOT DETECTED
Streptococcus species: NOT DETECTED

## 2017-04-13 LAB — T4, FREE: Free T4: 0.6 ng/dL — ABNORMAL LOW (ref 0.61–1.12)

## 2017-04-13 LAB — BASIC METABOLIC PANEL
ANION GAP: 7 (ref 5–15)
BUN: 11 mg/dL (ref 6–20)
CHLORIDE: 104 mmol/L (ref 101–111)
CO2: 24 mmol/L (ref 22–32)
Calcium: 7.7 mg/dL — ABNORMAL LOW (ref 8.9–10.3)
Creatinine, Ser: 0.76 mg/dL (ref 0.61–1.24)
GFR calc non Af Amer: >60 mL/min (ref >60)
Glucose, Bld: 238 mg/dL — ABNORMAL HIGH (ref 65–99)
POTASSIUM: 3.9 mmol/L (ref 3.5–5.1)
SODIUM: 135 mmol/L (ref 135–145)

## 2017-04-13 LAB — GLUCOSE, CAPILLARY
GLUCOSE-CAPILLARY: 252 mg/dL — AB (ref 65–99)
GLUCOSE-CAPILLARY: 133 mg/dL — AB (ref 65–99)
GLUCOSE-CAPILLARY: 123 mg/dL — AB (ref 65–99)
Glucose-Capillary: 211 mg/dL — ABNORMAL HIGH (ref 65–99)

## 2017-04-13 LAB — CBC
HCT: 21 % — ABNORMAL LOW (ref 39.0–52.0)
HEMOGLOBIN: 7 g/dL — AB (ref 13.0–17.0)
MCH: 30.7 pg (ref 26.0–34.0)
MCHC: 33.3 g/dL (ref 30.0–36.0)
MCV: 92.1 fL (ref 78.0–100.0)
Platelets: 120 10*3/uL — ABNORMAL LOW (ref 150–400)
RBC: 2.28 MIL/uL — AB (ref 4.22–5.81)
RDW: 18.3 % — ABNORMAL HIGH (ref 11.5–15.5)
WBC: 8.6 10*3/uL (ref 4.0–10.5)

## 2017-04-13 LAB — PREPARE RBC (CROSSMATCH)

## 2017-04-13 MED ORDER — SODIUM CHLORIDE 0.9 % IV SOLN
Freq: Once | INTRAVENOUS | Status: AC
  Administered 2017-04-13: 15:00:00 via INTRAVENOUS

## 2017-04-13 MED ORDER — LOPERAMIDE HCL 2 MG PO CAPS: 2.0000 mg | ORAL_CAPSULE | Freq: Three times a day (TID) | ORAL | Status: DC | PRN

## 2017-04-13 MED ORDER — PANCRELIPASE (LIP-PROT-AMYL) 12000-38000 UNITS PO CPEP
36000.0000 [IU] | ORAL_CAPSULE | Freq: Three times a day (TID) | ORAL | Status: DC
  Administered 2017-04-13 – 2017-04-16 (×8): 36000 [IU] via ORAL
  Filled 2017-04-13 (×9): qty 3

## 2017-04-13 MED ORDER — DEXTROSE 5 % IV SOLN
1.0000 g | Freq: Three times a day (TID) | INTRAVENOUS | Status: DC
  Administered 2017-04-13 – 2017-04-16 (×10): 1 g via INTRAVENOUS
  Filled 2017-04-13 (×12): qty 1

## 2017-04-13 NOTE — Progress Notes (Addendum)
Covering for Dr. [name].  Notified of admission. Dr. [name] performed an EGD and placed Wallstent across a malignant obstruction at the pylorus and duodenal bulb on 6/20. Pt is now hospitalized with Klebsiella sepsis. Abdominal/pelvic CT does not show evidence of perforation, any inflammatory process and the biliary tree is not dilated. Alk phos improved however t bili has increased compared to LFTs on 6/11. Would consult IR for possible biliary source of infection, possible obstructed biliary catheter. I will notify Dr. [name] of admission when he returns on Monday.

## 2017-04-13 NOTE — Progress Notes (Signed)
PHARMACY - PHYSICIAN COMMUNICATION CRITICAL VALUE ALERT - BLOOD CULTURE IDENTIFICATION (BCID)  Results for orders placed or performed during the hospital encounter of 04/12/17  Blood Culture ID Panel (Reflexed) (Collected: 04/12/2017 11:26 AM)  Result Value Ref Range   Enterococcus species NOT DETECTED NOT DETECTED   Listeria monocytogenes NOT DETECTED NOT DETECTED   Staphylococcus species NOT DETECTED NOT DETECTED   Staphylococcus aureus NOT DETECTED NOT DETECTED   Streptococcus species NOT DETECTED NOT DETECTED   Streptococcus agalactiae NOT DETECTED NOT DETECTED   Streptococcus pneumoniae NOT DETECTED NOT DETECTED   Streptococcus pyogenes NOT DETECTED NOT DETECTED   Acinetobacter baumannii NOT DETECTED NOT DETECTED   Enterobacteriaceae species DETECTED (A) NOT DETECTED   Enterobacter cloacae complex DETECTED (A) NOT DETECTED   Escherichia coli NOT DETECTED NOT DETECTED   Klebsiella oxytoca NOT DETECTED NOT DETECTED   Klebsiella pneumoniae DETECTED (A) NOT DETECTED   Proteus species NOT DETECTED NOT DETECTED   Serratia marcescens NOT DETECTED NOT DETECTED   Carbapenem resistance NOT DETECTED NOT DETECTED   Haemophilus influenzae NOT DETECTED NOT DETECTED   Neisseria meningitidis NOT DETECTED NOT DETECTED   Pseudomonas aeruginosa NOT DETECTED NOT DETECTED   Candida albicans NOT DETECTED NOT DETECTED   Candida glabrata NOT DETECTED NOT DETECTED   Candida krusei NOT DETECTED NOT DETECTED   Candida parapsilosis NOT DETECTED NOT DETECTED   Candida tropicalis NOT DETECTED NOT DETECTED    Name of physician (or Provider) Contacted: Dr. Olevia Bowens  Changes to prescribed antibiotics required: Change zosyn to Cefepime 1gm iv q8hr per Rapid ID Treatment Algorithm    Nani Skillern Crowford 04/13/2017  3:22 AM

## 2017-04-13 NOTE — Progress Notes (Addendum)
PROGRESS NOTE    Jacob Hardin  CBS:[id number] DOB: [birth year] DOA: 04/12/2017 PCP: [name], MD   Brief Narrative: 71 year old male with metastatic pancreatic cancer infiltrating invading duodenum,  status post duodenal stent and pancreatic duct stent placed by Dr. [name] on 04/10/2017, presented with a fever, generalized weakness and not feeling well. Patient had a similar problem after his stent placement in the past when he was found to have sepsis. In the ER, patient had temperature of 102.6 and lactic acid level of 9.1. CT scan of abdomen consistent with progression of hepatic metastasis with no problem and stent. Started on vancomycin and Zosyn and admitted for further evaluation.  Assessment & Plan:  #  Sepsis due to Klebsiella bacteremia: -Patient recently had a GI procedure including stent placement of duodenum. Blood culture already growing Klebsiella. Follow up final culture and sensitivity. -Continue cefepime and dc vancomycin  -Patient is afebrile today. Clinically feeling better. -Lactic level improving. Continue IV fluid. Monitor labs.  #Metastatic pancreatic cancer: Patient follows up with his oncologist Dr.Sherrill. As per patient and his wife, Dr.Sherrill knows about patient being in the hospital. Recommended outpatient follow-up.  #Gastric outlet obstruction: As per prior record it was caused by duodenal bulb fungating mass from the metastatic cancer. The duodenal stent was placed on June 20. I consulted Dr. Fuller Plan from GI was covering Dr. Almyra Free in the weekend. I reviewed with him, he recommended to continue antibiotics and no inpatient intervention recommended. -Liver enzymes are not trending up -resume creon  # Diarrhea: Ordered stool Cdiff. However the stool was not loose therefore it was canceled by the lab. Continue to monitor. Provide supportive care.  #Type 2 diabetes: Continue current insulin regimen. Monitor blood sugar level.  #Possible moderate  protein calorie malnutrition: Dietary referral  #Normocytic anemia and thrombocytopenia: Patient received chemotherapy unknown if it is related with that. Plan to transfuse 2 units of blood today. Patient feels weak and tired. I discussed with them.  #TSH level elevated, check a free T3 and free T4 level.  Principal Problem:   Sepsis ([place]) Active Problems:   Diabetes mellitus without complication ([place])   Hypertension   Primary pancreatic cancer with metastasis to other site [facility])   Malnutrition of moderate degree  DVT prophylaxis: SCD. Discontinue heparin because of severe anemia, thrombocytopenia Code Status: DO NOT RESUSCITATE Family Communication: discussed with the patient's wife at bedside Disposition Plan: likely discharge home in 2-3 days    Consultants:   GI consult to Dr. Fuller Plan  Procedures: CT scan of abdomen and pelvis Antimicrobials:  IV vancomycin since June 22 IV cefepime since June 22 Subjective:  Seen and examined at bedside. Feeling better but still has weakness fatigue. Denied chills, nausea vomiting or abdominal pain. Tolerating diet well.  Objective: Vitals:   04/12/17 2216 04/13/17 0559 04/13/17 1111 04/13/17 1112  BP: 90/74 118/64    Pulse: 80 69    Resp: 20 19    Temp: 98.6 F (37 C) 97.6 F (36.4 C) 98.2 F (36.8 C) 97.8 F (36.6 C)  TempSrc: Oral Oral Oral Axillary  SpO2: 100% 100%    Weight:      Height:        Intake/Output Summary (Last 24 hours) at 04/13/17 1249 Last data filed at 04/13/17 0903  Gross per 24 hour  Intake          2436.67 ml  Output              600 ml  Net          1836.67 ml   Filed Weights   04/12/17 0939 04/12/17 1856  Weight: 63.5 kg (140 lb) 67.4 kg (148 lb 9.4 oz)    Examination:  General exam: Appears calm and comfortable  Respiratory system: Clear to auscultation. Respiratory effort normal. No wheezing or crackle Cardiovascular system: S1 & S2 heard, RRR.  No pedal edema. Gastrointestinal system:  Abdomen is nondistended, soft and nontender. Normal bowel sounds heard. Central nervous system: Alert and oriented. No focal neurological deficits. Extremities: Symmetric 5 x 5 power. Skin: No rashes, lesions or ulcers Psychiatry: Judgement and insight appear normal. Mood & affect appropriate.     Data Reviewed: I have personally reviewed following labs and imaging studies  CBC:  Recent Labs Lab 04/12/17 0950 04/13/17 0335  WBC 8.1 8.6  NEUTROABS 7.6  --   HGB 7.3* 7.0*  HCT 21.7* 21.0*  MCV 90.0 92.1  PLT 123* 564*   Basic Metabolic Panel:  Recent Labs Lab 04/12/17 0950 04/13/17 0335  NA 131* 135  K 3.7 3.9  CL 98* 104  CO2 26 24  GLUCOSE 194* 238*  BUN 14 11  CREATININE 0.74 0.76  CALCIUM 8.0* 7.7*   GFR: Estimated Creatinine Clearance: 83.1 mL/min (by C-G formula based on SCr of 0.76 mg/dL). Liver Function Tests:  Recent Labs Lab 04/12/17 1526  AST 71*  ALT 61  ALKPHOS 937*  BILITOT 2.5*  PROT 5.6*  ALBUMIN 2.5*   No results for input(s): LIPASE, AMYLASE in the last 168 hours. No results for input(s): AMMONIA in the last 168 hours. Coagulation Profile:  Recent Labs Lab 04/12/17 2000  INR 1.20   Cardiac Enzymes: No results for input(s): CKTOTAL, CKMB, CKMBINDEX, TROPONINI in the last 168 hours. BNP (last 3 results) No results for input(s): PROBNP in the last 8760 hours. HbA1C: No results for input(s): HGBA1C in the last 72 hours. CBG:  Recent Labs Lab 04/10/17 1221 04/12/17 1807 04/12/17 2212 04/13/17 0725 04/13/17 1131  GLUCAP 148* 172* 318* 211* 252*   Lipid Profile: No results for input(s): CHOL, HDL, LDLCALC, TRIG, CHOLHDL, LDLDIRECT in the last 72 hours. Thyroid Function Tests:  Recent Labs  04/12/17 2000  TSH 24.043*   Anemia Panel: No results for input(s): VITAMINB12, FOLATE, FERRITIN, TIBC, IRON, RETICCTPCT in the last 72 hours. Sepsis Labs:  Recent Labs Lab 04/12/17 1000 04/12/17 1409 04/12/17 1526  04/12/17 1541  LATICACIDVEN 1.27 9.17* 4.0* 4.18*    Recent Results (from the past 240 hour(s))  Blood culture (routine x 2)     Status: None (Preliminary result)   Collection Time: 04/12/17 11:25 AM  Result Value Ref Range Status   Specimen Description BLOOD RIGHT FOREARM  Final   Special Requests   Final    BOTTLES DRAWN AEROBIC AND ANAEROBIC Blood Culture adequate volume   Culture   Final    NO GROWTH < 24 HOURS Performed at [facility], [place] [address], [place], [place] [address]    Report Status PENDING  Incomplete  Blood culture (routine x 2)     Status: None (Preliminary result)   Collection Time: 04/12/17 11:26 AM  Result Value Ref Range Status   Specimen Description BLOOD RIGHT HAND  Final   Special Requests   Final    BOTTLES DRAWN AEROBIC AND ANAEROBIC Blood Culture adequate volume   Culture  Setup Time   Final    GRAM NEGATIVE RODS IN BOTH AEROBIC AND ANAEROBIC BOTTLES CRITICAL  RESULT CALLED TO, READ BACK BY AND VERIFIED WITH: Lavell Luster PHARMD [id number] 04/13/17 A BROWNING    Culture   Final    GRAM NEGATIVE RODS CULTURE REINCUBATED FOR BETTER GROWTH Performed at [facility], [place] [address], [place], [place] [address]    Report Status PENDING  Incomplete  Blood Culture ID Panel (Reflexed)     Status: Abnormal   Collection Time: 04/12/17 11:26 AM  Result Value Ref Range Status   Enterococcus species NOT DETECTED NOT DETECTED Final   Listeria monocytogenes NOT DETECTED NOT DETECTED Final   Staphylococcus species NOT DETECTED NOT DETECTED Final   Staphylococcus aureus NOT DETECTED NOT DETECTED Final   Streptococcus species NOT DETECTED NOT DETECTED Final   Streptococcus agalactiae NOT DETECTED NOT DETECTED Final   Streptococcus pneumoniae NOT DETECTED NOT DETECTED Final   Streptococcus pyogenes NOT DETECTED NOT DETECTED Final   Acinetobacter baumannii NOT DETECTED NOT DETECTED Final   Enterobacteriaceae species DETECTED (A) NOT DETECTED Final     Comment: CRITICAL RESULT CALLED TO, READ BACK BY AND VERIFIED WITH: J GRIMSLEY PHARMD [id number] 04/13/17 A BROWNING    Enterobacter cloacae complex DETECTED (A) NOT DETECTED Final    Comment: CRITICAL RESULT CALLED TO, READ BACK BY AND VERIFIED WITH: J GRIMSLEY PHARMD 0121 04/13/17 A BROWNING    Escherichia coli NOT DETECTED NOT DETECTED Final   Klebsiella oxytoca NOT DETECTED NOT DETECTED Final   Klebsiella pneumoniae DETECTED (A) NOT DETECTED Final    Comment: CRITICAL RESULT CALLED TO, READ BACK BY AND VERIFIED WITH: Lavell Luster PHARMD 0121 04/13/17 A BROWNING    Proteus species NOT DETECTED NOT DETECTED Final   Serratia marcescens NOT DETECTED NOT DETECTED Final   Carbapenem resistance NOT DETECTED NOT DETECTED Final   Haemophilus influenzae NOT DETECTED NOT DETECTED Final   Neisseria meningitidis NOT DETECTED NOT DETECTED Final   Pseudomonas aeruginosa NOT DETECTED NOT DETECTED Final   Candida albicans NOT DETECTED NOT DETECTED Final   Candida glabrata NOT DETECTED NOT DETECTED Final   Candida krusei NOT DETECTED NOT DETECTED Final   Candida parapsilosis NOT DETECTED NOT DETECTED Final   Candida tropicalis NOT DETECTED NOT DETECTED Final    Comment: Performed at [facility], [address], [place], [place] [address]         Radiology Studies: Ct Abdomen Pelvis W Contrast  Result Date: 04/12/2017 CLINICAL DATA:  Pancreatic cancer with duodenal invasion. Now with weakness and hypotension. EXAM: CT ABDOMEN AND PELVIS WITH CONTRAST TECHNIQUE: Multidetector CT imaging of the abdomen and pelvis was performed using the standard protocol following bolus administration of intravenous contrast. CONTRAST:  29mL ISOVUE-300 IOPAMIDOL (ISOVUE-300) INJECTION 61% COMPARISON:  10/13/2016 FINDINGS: Lower chest:  Compressive atelectasis right lung base. Hepatobiliary: Multiple new and enlarging liver metastases are evident. Liver metastases range in size from about 5 mm up to 1 of the more  dominant lesions identified in the inferior right liver measuring 3.9 cm (image 35 series 2). Internal external biliary drain is identified, coursing through the common bile duct stent which appears to be in stable positions since prior study. Pancreas: Pancreatic parenchyma is diffusely atrophic and mild diffuse dilatation of the main pancreatic duct is evident. Fullness in the region the pancreatic head is stable. Interval development of thickening in the region of the pancreatic tail now measuring 2.5 cm in AP diameter compared to 1.4 cm previously. Spleen: No splenomegaly. No focal mass lesion. Adrenals/Urinary Tract: No adrenal nodule or mass. Kidneys are unremarkable. No  hydronephrosis. No evidence for hydroureter. Urinary bladder is moderately distended. Stomach/Bowel: Stomach is nondilated. Duodenal stent again identified with interval placement of a more proximal overlapping stent in the distal stomach. No evidence for small bowel dilatation to suggest obstruction. Terminal ileum unremarkable. Appendix is normal in appearance. Colon is nondilated with contrast material seen extending from the ascending colon through to the level of the rectum. Vascular/Lymphatic: The portal vein and superior mesenteric vein are patent. Splenic vein patency cannot be confirmed. There is abdominal aortic atherosclerosis without aneurysm. No gastrohepatic or hepatoduodenal ligament lymphadenopathy. No retroperitoneal lymphadenopathy No pelvic sidewall lymphadenopathy. Reproductive: The prostate gland and seminal vesicles have normal imaging features. Other: Small volume intraperitoneal free fluid noted. Musculoskeletal: Bone windows reveal no worrisome lytic or sclerotic osseous lesions. IMPRESSION: 1. Interval progression of hepatic metastases. 2. Features suggest interval progression of main pancreatic ductal dilatation with new soft tissue fullness identified in the pancreatic tail region. 3. Pole vein and superior  mesenteric vein remain patent. Patency of the splenic vein cannot be confirmed on today's study. 4. Interval placement of an overlapping distal gastric stent proximal to the pre-existing duodenal stent. 5. Interval percutaneous internal external biliary drain placement. No substantial biliary dilatation on today's study. Electronically Signed   By: Misty Stanley M.D.   On: 04/12/2017 15:44   Dg Chest Port 1 View  Result Date: 04/12/2017 CLINICAL DATA:  Weakness.  Possible sepsis. EXAM: PORTABLE CHEST 1 VIEW COMPARISON:  02/09/2017 FINDINGS: Right Port-A-Cath which terminates at the low SVC. Right hemidiaphragm elevation is moderate. Midline trachea. Normal heart size. Atherosclerosis in the transverse aorta. No pleural effusion or pneumothorax. Clear lungs. IMPRESSION: No acute cardiopulmonary disease. Aortic Atherosclerosis (ICD10-I70.0). Electronically Signed   By: Abigail Miyamoto M.D.   On: 04/12/2017 11:51        Scheduled Meds: . heparin  5,000 Units Subcutaneous Q8H  . insulin aspart  0-9 Units Subcutaneous TID WC  . insulin aspart  4 Units Subcutaneous QAC supper  . insulin detemir  40 Units Subcutaneous Daily  . sodium chloride flush  3 mL Intravenous Q12H   Continuous Infusions: . sodium chloride Stopped (04/13/17 1026)  . ceFEPime (MAXIPIME) IV Stopped (04/13/17 0538)  . vancomycin Stopped (04/13/17 1213)     LOS: 1 day    Novice Vrba Tanna Furry, MD [facility] Pager [phone]  If 7PM-7AM, please contact night-coverage [email or web address] Password [facility] 04/13/2017, 12:49 PM

## 2017-04-14 ENCOUNTER — Inpatient Hospital Stay (HOSPITAL_COMMUNITY): Payer: Medicare Other

## 2017-04-14 LAB — GLUCOSE, CAPILLARY
GLUCOSE-CAPILLARY: 65 mg/dL (ref 65–99)
GLUCOSE-CAPILLARY: 118 mg/dL — AB (ref 65–99)
Glucose-Capillary: 72 mg/dL (ref 65–99)
Glucose-Capillary: 182 mg/dL — ABNORMAL HIGH (ref 65–99)
Glucose-Capillary: 187 mg/dL — ABNORMAL HIGH (ref 65–99)

## 2017-04-14 LAB — CBC
HEMATOCRIT: 26.3 % — AB (ref 39.0–52.0)
HEMOGLOBIN: 9.1 g/dL — AB (ref 13.0–17.0)
MCH: 30.4 pg (ref 26.0–34.0)
MCHC: 34.6 g/dL (ref 30.0–36.0)
MCV: 88 fL (ref 78.0–100.0)
Platelets: 115 10*3/uL — ABNORMAL LOW (ref 150–400)
RBC: 2.99 MIL/uL — ABNORMAL LOW (ref 4.22–5.81)
RDW: 17.7 % — ABNORMAL HIGH (ref 11.5–15.5)
WBC: 8.4 10*3/uL (ref 4.0–10.5)

## 2017-04-14 LAB — COMPREHENSIVE METABOLIC PANEL
ALBUMIN: 2.2 g/dL — AB (ref 3.5–5.0)
ALK PHOS: 736 U/L — AB (ref 38–126)
ALT: 53 U/L (ref 17–63)
AST: 60 U/L — ABNORMAL HIGH (ref 15–41)
Anion gap: 8 (ref 5–15)
BILIRUBIN TOTAL: 2.5 mg/dL — AB (ref 0.3–1.2)
BUN: 18 mg/dL (ref 6–20)
CALCIUM: 8 mg/dL — AB (ref 8.9–10.3)
CO2: 23 mmol/L (ref 22–32)
Chloride: 103 mmol/L (ref 101–111)
Creatinine, Ser: 0.71 mg/dL (ref 0.61–1.24)
GFR calc Af Amer: >60 mL/min (ref >60)
GFR calc non Af Amer: >60 mL/min (ref >60)
GLUCOSE: 110 mg/dL — AB (ref 65–99)
Potassium: 3.5 mmol/L (ref 3.5–5.1)
SODIUM: 134 mmol/L — AB (ref 135–145)
TOTAL PROTEIN: 5.2 g/dL — AB (ref 6.5–8.1)

## 2017-04-14 LAB — T3, FREE: T3 FREE: 1.1 pg/mL — AB (ref 2.0–4.4)

## 2017-04-14 LAB — URINE CULTURE: CULTURE: NO GROWTH

## 2017-04-14 LAB — LACTIC ACID, PLASMA: Lactic Acid, Venous: 1.8 mmol/L (ref 0.5–1.9)

## 2017-04-14 NOTE — Progress Notes (Signed)
Referring Physician(s): Dr Benny Lennert  Supervising Physician: Daryll Brod  Patient Status:  [facility] - In-pt  Chief Complaint:  Hx Pancreatic Ca Biliary I/E drain placed 03/20/17 Wallstent placed with Dr [name] 6/20  Subjective:  Has presented to ED 6/23 with fever + Klebsiella sepsis IR has been asked to evaluate pt Pt up in room---walking halls Denies pain Afeb this am Output from I/E drain is slow since discharge Wife says flushing has been met with resistance  Allergies: Codeine  Medications: Prior to Admission medications   Medication Sig Start Date End Date Taking? Authorizing Provider  acetaminophen (TYLENOL) 500 MG tablet Take 1,000 mg by mouth every 6 (six) hours as needed.   Yes [provider]  baclofen (LIORESAL) 10 MG tablet Take 5 mg by mouth 3 (three) times daily as needed (for hiccups).   Yes [provider]  ibuprofen (ADVIL,MOTRIN) 200 MG tablet Take 400 mg by mouth every 6 (six) hours as needed.   Yes [provider]  insulin aspart (NOVOLOG FLEXPEN) 100 UNIT/ML FlexPen Inject 4 Units into the skin daily before supper.   Yes [provider]  Insulin Detemir (LEVEMIR FLEXTOUCH) 100 UNIT/ML Pen Inject 40 Units into the skin daily. 03/21/17  Yes [name], MD  lidocaine-prilocaine (EMLA) cream Apply 1 application topically as needed (prior to accessing port).   Yes [provider]  lipase/protease/amylase (CREON) 36000 UNITS CPEP capsule Take 1 capsule (36,000 Units total) by mouth 3 (three) times daily before meals. 02/22/17  Yes Ladell Pier, MD  loratadine (CLARITIN) 10 MG tablet Take 10 mg by mouth daily.    Yes [provider]  Multiple Vitamin (MULTIVITAMIN WITH MINERALS) TABS tablet Take 1 tablet by mouth daily.   Yes [provider]  ondansetron (ZOFRAN) 8 MG tablet Take 1 tablet (8 mg total) by mouth every 8 (eight) hours as needed for nausea or vomiting. 03/05/17  Yes Owens Shark, NP   polyethylene glycol (MIRALAX / GLYCOLAX) packet Take 17 g by mouth daily as needed for mild constipation.    Yes [provider]  prochlorperazine (COMPAZINE) 10 MG tablet Take 1 tablet (10 mg total) by mouth every 6 (six) hours as needed for nausea or vomiting. 03/05/17  Yes Owens Shark, NP  BD PEN NEEDLE NANO U/F 32G X 4 MM MISC USE TO INJECT INSULIN TWICE A DAY 11/06/16   [name], MD  ciprofloxacin (CIPRO) 500 MG tablet Take 1 tablet (500 mg total) by mouth 2 (two) times daily. Patient not taking: Reported on 04/12/2017 04/01/17   Owens Shark, NP  hydrOXYzine (ATARAX/VISTARIL) 25 MG tablet Take 1 tablet (25 mg total) by mouth every 8 (eight) hours as needed for itching. Patient not taking: Reported on 04/12/2017 12/06/16   Ladell Pier, MD  traMADol (ULTRAM) 50 MG tablet Take 1 tablet (50 mg total) by mouth every 6 (six) hours as needed. Patient not taking: Reported on 04/12/2017 02/15/17   Theodis Blaze, MD     Vital Signs: BP 104/70 (BP Location: Right Arm)   Pulse 74   Temp 97.8 F (36.6 C) (Oral)   Resp 18   Ht 6' (1.829 m)   Wt 148 lb 9.4 oz (67.4 kg)   SpO2 98%   BMI 20.15 kg/m   Physical Exam  Constitutional: He is oriented to person, place, and time.  Abdominal: Soft. Bowel sounds are normal.  Musculoskeletal: Normal range of motion.  Neurological: He  is alert and oriented to person, place, and time.  Skin: Skin is warm and dry.  Skin site is clean and dry NT No bleeding OP is dark brown Maybe 20 cc in bag (collection of 4 days)   Psychiatric: He has a normal mood and affect. His behavior is normal.  Nursing note and vitals reviewed.   Imaging: Dg Abd 1 View - Kub  Result Date: 04/10/2017 CLINICAL DATA:  Duodenum stent. EXAM: ABDOMEN - 1 VIEW COMPARISON:  04/09/2017 FINDINGS: Two duodenum stents noted projected over the proximal duodenum. Stent position appears stable from prior exam. Biliary stent noted. Traversing biliary stent is a  biliary drainage catheter. Oral contrast in the colon. No bowel distention. Degenerative changes thoracolumbar spine . IMPRESSION: Two duodenum stents noted projected the proximal duodenum. Biliary stent and biliary drainage tube noted. Oral contrast in the colon. Electronically Signed   By: Marcello Moores  Register   On: 04/10/2017 14:05   Ct Abdomen Pelvis W Contrast  Result Date: 04/12/2017 CLINICAL DATA:  Pancreatic cancer with duodenal invasion. Now with weakness and hypotension. EXAM: CT ABDOMEN AND PELVIS WITH CONTRAST TECHNIQUE: Multidetector CT imaging of the abdomen and pelvis was performed using the standard protocol following bolus administration of intravenous contrast. CONTRAST:  67m ISOVUE-300 IOPAMIDOL (ISOVUE-300) INJECTION 61% COMPARISON:  10/13/2016 FINDINGS: Lower chest:  Compressive atelectasis right lung base. Hepatobiliary: Multiple new and enlarging liver metastases are evident. Liver metastases range in size from about 5 mm up to 1 of the more dominant lesions identified in the inferior right liver measuring 3.9 cm (image 35 series 2). Internal external biliary drain is identified, coursing through the common bile duct stent which appears to be in stable positions since prior study. Pancreas: Pancreatic parenchyma is diffusely atrophic and mild diffuse dilatation of the main pancreatic duct is evident. Fullness in the region the pancreatic head is stable. Interval development of thickening in the region of the pancreatic tail now measuring 2.5 cm in AP diameter compared to 1.4 cm previously. Spleen: No splenomegaly. No focal mass lesion. Adrenals/Urinary Tract: No adrenal nodule or mass. Kidneys are unremarkable. No hydronephrosis. No evidence for hydroureter. Urinary bladder is moderately distended. Stomach/Bowel: Stomach is nondilated. Duodenal stent again identified with interval placement of a more proximal overlapping stent in the distal stomach. No evidence for small bowel dilatation to  suggest obstruction. Terminal ileum unremarkable. Appendix is normal in appearance. Colon is nondilated with contrast material seen extending from the ascending colon through to the level of the rectum. Vascular/Lymphatic: The portal vein and superior mesenteric vein are patent. Splenic vein patency cannot be confirmed. There is abdominal aortic atherosclerosis without aneurysm. No gastrohepatic or hepatoduodenal ligament lymphadenopathy. No retroperitoneal lymphadenopathy No pelvic sidewall lymphadenopathy. Reproductive: The prostate gland and seminal vesicles have normal imaging features. Other: Small volume intraperitoneal free fluid noted. Musculoskeletal: Bone windows reveal no worrisome lytic or sclerotic osseous lesions. IMPRESSION: 1. Interval progression of hepatic metastases. 2. Features suggest interval progression of main pancreatic ductal dilatation with new soft tissue fullness identified in the pancreatic tail region. 3. Pole vein and superior mesenteric vein remain patent. Patency of the splenic vein cannot be confirmed on today's study. 4. Interval placement of an overlapping distal gastric stent proximal to the pre-existing duodenal stent. 5. Interval percutaneous internal external biliary drain placement. No substantial biliary dilatation on today's study. Electronically Signed   By: EMisty StanleyM.D.   On: 04/12/2017 15:44   Dg Chest Port 1 View  Result Date: 04/12/2017 CLINICAL DATA:  Weakness.  Possible sepsis. EXAM: PORTABLE CHEST 1 VIEW COMPARISON:  02/09/2017 FINDINGS: Right Port-A-Cath which terminates at the low SVC. Right hemidiaphragm elevation is moderate. Midline trachea. Normal heart size. Atherosclerosis in the transverse aorta. No pleural effusion or pneumothorax. Clear lungs. IMPRESSION: No acute cardiopulmonary disease. Aortic Atherosclerosis (ICD10-I70.0). Electronically Signed   By: Abigail Miyamoto M.D.   On: 04/12/2017 11:51    Labs:  CBC:  Recent Labs  04/03/17 0941  04/12/17 0950 04/13/17 0335 04/14/17 0102  WBC 13.0* 8.1 8.6 8.4  HGB 9.2* 7.3* 7.0* 9.1*  HCT 29.5* 21.7* 21.0* 26.3*  PLT 336 123* 120* 115*    COAGS:  Recent Labs  10/12/16 2021 11/07/16 0945 02/09/17 0751 03/20/17 1350 04/12/17 2000  INR 0.98 1.01 1.01 1.14 1.20  APTT 30  --   --  33  --     BMP:  Recent Labs  03/20/17 1350 04/01/17 0756 04/12/17 0950 04/13/17 0335 04/14/17 0102  NA 131* 137 131* 135 134*  K 3.4* 3.9 3.7 3.9 3.5  CL 97*  --  98* 104 103  CO2 _0 GLUCOSE 170* 258* 194* 238* 110*  BUN 15 11._1 CALCIUM 8.5* 8.6 8.0* 7.7* 8.0*  CREATININE 0.72 0.7 0.74 0.76 0.71  GFRNONAA >60  --  >60 >60 >60  GFRAA >60  --  >60 >60 >60    LIVER FUNCTION TESTS:  Recent Labs  03/20/17 1350 04/01/17 0756 04/12/17 1526 04/14/17 0102  BILITOT 1.6* 0.83 2.5* 2.5*  AST 33 65* 71* 60*  ALT 47 53 61 53  ALKPHOS 560* 1,313* 937* 736*  PROT 6.2* 5.8* 5.6* 5.2*  ALBUMIN 2.5* 2.2* 2.5* 2.2*    Assessment and Plan:  I/E Biliary drain in place To gravity drain  Discussed with Dr Annamaria Boots Order Abd 1 view now.  Electronically Signed: Lavonia Drafts, PA-C 04/14/2017, 9:11 AM   I spent a total of 15 Minutes at the the patient's bedside AND on the patient's hospital floor or unit, greater than 50% of which was counseling/coordinating care for I/E biliary drain

## 2017-04-14 NOTE — Progress Notes (Signed)
Initial Nutrition Assessment  DOCUMENTATION CODES:   Non-severe (moderate) malnutrition in context of chronic illness  INTERVENTION:   Family to bring patient's preferred protein supplements from home Encourage PO intake RD to continue to monitor  NUTRITION DIAGNOSIS:   Malnutrition (Moderate) related to cancer and cancer related treatments, chronic illness as evidenced by percent weight loss, moderate depletion of body fat, moderate depletions of muscle mass.  GOAL:   Patient will meet greater than or equal to 90% of their needs  MONITOR:   PO intake, Supplement acceptance, Labs, Weight trends, I & O's  REASON FOR ASSESSMENT:   Consult Assessment of nutrition requirement/status  ASSESSMENT:   71 year old male with metastatic pancreatic cancer infiltrating invading duodenum,  status post duodenal stent and pancreatic duct stent placed by Dr. [name] on 04/10/2017, presented with a fever, generalized weakness and not feeling well. Patient had a similar problem after his stent placement in the past when he was found to have sepsis.  Patient in room with wife at bedside. Pt eating lunch of [place] [employer], peaches and milk. Pt states he has to eat slowly with little bites. He has experienced taste changes. Last night he had to eat chocolate ice cream d/t a low blood sugar. Pt consuming 75% of meals at this time. Pt states he prefers Special K protein shakes and Nestle essentials protein shakes versus Ensure or Boost drinks. Encouraged pt's wife to bring protein shakes for patient to have while he is admitted. States she will bring a few for him as he is expecting to possible discharge tomorrow.  Per chart review, pt has lost 12 lb since 4/28 (8% wt loss x 2 months, significant for time frame). Nutrition-Focused physical exam completed. Findings are moderate fat depletion, moderate muscle depletion, and no edema.   Medications: CREON capsule TID Labs reviewed: CBGs: 182 Low Na  Diet  Order:  Diet Carb Modified Fluid consistency: Thin; Room service appropriate? Yes  Skin:  Reviewed, no issues  Last BM:  6/23  Height:   Ht Readings from Last 1 Encounters:  04/12/17 6' (1.829 m)    Weight:   Wt Readings from Last 1 Encounters:  04/12/17 148 lb 9.4 oz (67.4 kg)    Ideal Body Weight:  80.9 kg  BMI:  Body mass index is 20.15 kg/m.  Estimated Nutritional Needs:   Kcal:  2000-2200  Protein:  100-110g  Fluid:  2L/day  EDUCATION NEEDS:   No education needs identified at this time  Clayton Bibles, MS, RD, LDN Pager: [phone] After Hours Pager: [phone]

## 2017-04-14 NOTE — Progress Notes (Signed)
PROGRESS NOTE    Jacob Hardin  BBC:[id number] DOB: 01/08/1946 DOA: 04/12/2017 PCP: [name], MD   Brief Narrative: 71 year old male with metastatic pancreatic cancer infiltrating invading duodenum,  status post duodenal stent and pancreatic duct stent placed by Dr. [name] on 04/10/2017, presented with a fever, generalized weakness and not feeling well. Patient had a similar problem after his stent placement in the past when he was found to have sepsis. In the ER, patient had temperature of 102.6 and lactic acid level of 9.1. CT scan of abdomen consistent with progression of hepatic metastasis with no problem and stent. Started on vancomycin and Zosyn and admitted for further evaluation.  Assessment & Plan:  #  Sepsis due to Klebsiella bacteremia: -Patient recently had a GI procedure including stent placement of duodenum. Blood culture growing Klebsiella in one bottle. Follow up final culture and sensitivity. -Continue cefepime IV. -Patient is afebrile today. Improving clinically. Continue supportive care. -ID consult   #Metastatic pancreatic cancer: Patient follows up with his oncologist Dr.Sherrill. As per patient and his wife, Dr.Sherrill knows about patient being in the hospital. Recommended outpatient follow-up.  #Gastric outlet obstruction: As per prior record it was caused by duodenal bulb fungating mass from the metastatic cancer. The duodenal stent was placed on June 20. I consulted Dr. Fuller Plan from GI was covering Dr. Almyra Free in the weekend.  -IR consult to evaluate the stent. Getting abdomen x-ray. Clinically improving. -Liver enzymes are not trending up -resume creon  # Diarrhea: Diarrhea improved.  #Type 2 diabetes: Continue current insulin regimen. Monitor blood sugar level.  #Possible moderate protein calorie malnutrition: Dietary referral  #Normocytic anemia and thrombocytopenia: Received 2 units of red blood cell transfusion. Hemoglobin stable today. No sign of  bleeding. Platelet 115.  #TSH level elevated associated with a low free T3 and T4 consistent with hypothyroidism. I will start Synthroid. Recommended to follow up with PCP.  Principal Problem:   Sepsis ([place]) Active Problems:   Diabetes mellitus without complication ([place])   Hypertension   Primary pancreatic cancer with metastasis to other site [facility])   Malnutrition of moderate degree  DVT prophylaxis: SCD. Discontinue heparin because of severe anemia, thrombocytopenia Code Status: DO NOT RESUSCITATE Family Communication: discussed with the patient's wife at bedside Disposition Plan: likely discharge home in 2-3 days    Consultants:   GI consult to Dr. Fuller Plan    Procedures: CT scan of abdomen and pelvis Antimicrobials:  IV vancomycin since June 22-23 IV cefepime since June 22 Subjective:  Seen and examined at bedside. Feeling good. Denied headache, dizziness, nausea vomiting chest pain or shortness of breath. Wife at bedside. Objective: Vitals:   04/13/17 1743 04/13/17 1828 04/13/17 2115 04/14/17 0404  BP: 101/63 110/71 (!) 149/82 104/70  Pulse: 72 73 72 74  Resp: 16 16 16 18   Temp: 97.9 F (36.6 C) 97.6 F (36.4 C) 98.8 F (37.1 C) 97.8 F (36.6 C)  TempSrc: Oral Axillary Oral Oral  SpO2: 100% 100% 100% 98%  Weight:      Height:        Intake/Output Summary (Last 24 hours) at 04/14/17 1129 Last data filed at 04/14/17 0600  Gross per 24 hour  Intake          1824.16 ml  Output                0 ml  Net          1824.16 ml   [employer]   04/12/17  4818 04/12/17 1856  Weight: 63.5 kg (140 lb) 67.4 kg (148 lb 9.4 oz)    Examination:  General exam: Not in distress Respiratory system: Clear bilateral. Respiratory effort normal, no wheezing or crackle Cardiovascular system: Regular rate rhythm, S1 and S2 normal. No pedal edema Gastrointestinal system: Abdomen soft, nontender, nondistended. Bowel sound positive. Central nervous system: Alert and oriented. No  focal neurological deficits. Skin: No rashes, lesions or ulcers Psychiatry: Judgement and insight appear normal. Mood & affect appropriate.     Data Reviewed: I have personally reviewed following labs and imaging studies  CBC:  Recent Labs Lab 04/12/17 0950 04/13/17 0335 04/14/17 0102  WBC 8.1 8.6 8.4  NEUTROABS 7.6  --   --   HGB 7.3* 7.0* 9.1*  HCT 21.7* 21.0* 26.3*  MCV 90.0 92.1 88.0  PLT 123* 120* 563*   Basic Metabolic Panel:  Recent Labs Lab 04/12/17 0950 04/13/17 0335 04/14/17 0102  NA 131* 135 134*  K 3.7 3.9 3.5  CL 98* 104 103  CO2 26 24 23   GLUCOSE 194* 238* 110*  BUN 14 11 18   CREATININE 0.74 0.76 0.71  CALCIUM 8.0* 7.7* 8.0*   GFR: Estimated Creatinine Clearance: 83.1 mL/min (by C-G formula based on SCr of 0.71 mg/dL). Liver Function Tests:  Recent Labs Lab 04/12/17 1526 04/14/17 0102  AST 71* 60*  ALT 61 53  ALKPHOS 937* 736*  BILITOT 2.5* 2.5*  PROT 5.6* 5.2*  ALBUMIN 2.5* 2.2*   No results for input(s): LIPASE, AMYLASE in the last 168 hours. No results for input(s): AMMONIA in the last 168 hours. Coagulation Profile:  Recent Labs Lab 04/12/17 2000  INR 1.20   Cardiac Enzymes: No results for input(s): CKTOTAL, CKMB, CKMBINDEX, TROPONINI in the last 168 hours. BNP (last 3 results) No results for input(s): PROBNP in the last 8760 hours. HbA1C: No results for input(s): HGBA1C in the last 72 hours. CBG:  Recent Labs Lab 04/13/17 1131 04/13/17 1653 04/13/17 2117 04/14/17 0247 04/14/17 0737  GLUCAP 252* 133* 123* 65 72   Lipid Profile: No results for input(s): CHOL, HDL, LDLCALC, TRIG, CHOLHDL, LDLDIRECT in the last 72 hours. Thyroid Function Tests:  Recent Labs  04/12/17 2000 04/13/17 1009  TSH 24.043*  --   FREET4  --  0.60*  T3FREE  --  1.1*   Anemia Panel: No results for input(s): VITAMINB12, FOLATE, FERRITIN, TIBC, IRON, RETICCTPCT in the last 72 hours. Sepsis Labs:  Recent Labs Lab 04/12/17 1409  04/12/17 1526 04/12/17 1541 04/14/17 0102  LATICACIDVEN 9.17* 4.0* 4.18* 1.8    Recent Results (from the past 240 hour(s))  Blood culture (routine x 2)     Status: None (Preliminary result)   Collection Time: 04/12/17 11:25 AM  Result Value Ref Range Status   Specimen Description BLOOD RIGHT FOREARM  Final   Special Requests   Final    BOTTLES DRAWN AEROBIC AND ANAEROBIC Blood Culture adequate volume   Culture   Final    NO GROWTH 2 DAYS Performed at [facility], [place] [address], [place], [place] [address]    Report Status PENDING  Incomplete  Blood culture (routine x 2)     Status: None (Preliminary result)   Collection Time: 04/12/17 11:26 AM  Result Value Ref Range Status   Specimen Description BLOOD RIGHT HAND  Final   Special Requests   Final    BOTTLES DRAWN AEROBIC AND ANAEROBIC Blood Culture adequate volume   Culture  Setup Time  Final    GRAM NEGATIVE RODS IN BOTH AEROBIC AND ANAEROBIC BOTTLES CRITICAL RESULT CALLED TO, READ BACK BY AND VERIFIED WITH: Lavell Luster PHARMD [id number] 04/13/17 A BROWNING    Culture   Final    GRAM NEGATIVE RODS CULTURE REINCUBATED FOR BETTER GROWTH Performed at [facility], [place] [address] [place], [place] [address]    Report Status PENDING  Incomplete  Blood Culture ID Panel (Reflexed)     Status: Abnormal   Collection Time: 04/12/17 11:26 AM  Result Value Ref Range Status   Enterococcus species NOT DETECTED NOT DETECTED Final   Listeria monocytogenes NOT DETECTED NOT DETECTED Final   Staphylococcus species NOT DETECTED NOT DETECTED Final   Staphylococcus aureus NOT DETECTED NOT DETECTED Final   Streptococcus species NOT DETECTED NOT DETECTED Final   Streptococcus agalactiae NOT DETECTED NOT DETECTED Final   Streptococcus pneumoniae NOT DETECTED NOT DETECTED Final   Streptococcus pyogenes NOT DETECTED NOT DETECTED Final   Acinetobacter baumannii NOT DETECTED NOT DETECTED Final   Enterobacteriaceae species DETECTED (A) NOT  DETECTED Final    Comment: CRITICAL RESULT CALLED TO, READ BACK BY AND VERIFIED WITH: J GRIMSLEY PHARMD [id number] 04/13/17 A BROWNING    Enterobacter cloacae complex DETECTED (A) NOT DETECTED Final    Comment: CRITICAL RESULT CALLED TO, READ BACK BY AND VERIFIED WITH: J GRIMSLEY PHARMD 0121 04/13/17 A BROWNING    Escherichia coli NOT DETECTED NOT DETECTED Final   Klebsiella oxytoca NOT DETECTED NOT DETECTED Final   Klebsiella pneumoniae DETECTED (A) NOT DETECTED Final    Comment: CRITICAL RESULT CALLED TO, READ BACK BY AND VERIFIED WITH: Lavell Luster PHARMD 0121 04/13/17 A BROWNING    Proteus species NOT DETECTED NOT DETECTED Final   Serratia marcescens NOT DETECTED NOT DETECTED Final   Carbapenem resistance NOT DETECTED NOT DETECTED Final   Haemophilus influenzae NOT DETECTED NOT DETECTED Final   Neisseria meningitidis NOT DETECTED NOT DETECTED Final   Pseudomonas aeruginosa NOT DETECTED NOT DETECTED Final   Candida albicans NOT DETECTED NOT DETECTED Final   Candida glabrata NOT DETECTED NOT DETECTED Final   Candida krusei NOT DETECTED NOT DETECTED Final   Candida parapsilosis NOT DETECTED NOT DETECTED Final   Candida tropicalis NOT DETECTED NOT DETECTED Final    Comment: Performed at [facility], [address] [place], [place] [address]  Urine Culture     Status: None   Collection Time: 04/12/17  4:09 PM  Result Value Ref Range Status   Specimen Description URINE, CLEAN CATCH  Final   Special Requests NONE  Final   Culture   Final    NO GROWTH Performed at [facility], [place] [address], [place], [place] [address]    Report Status 04/14/2017 FINAL  Final         Radiology Studies: Dg Abd 1 View  Result Date: 04/14/2017 CLINICAL DATA:  Abdominal discomfort.  pancreatic stent placement EXAM: ABDOMEN - 1 VIEW COMPARISON:  CT 04/12/2017 FINDINGS: Duodenum stent noted. Common bile duct stent noted. Percutaneous biliary stent extends through the common bile duct. Stent in  the duodenum. No dilated to large or small bowel. Moderate volume stool throughout the colon. IMPRESSION: 1. Biliary drains and stents appear in proper orientation. Duodenum stent noted. 2. No evidence of bowel obstruction. 3. Moderate volume stool throughout the suggests constipation. Electronically Signed   By: Suzy Bouchard M.D.   On: 04/14/2017 09:52   Ct Abdomen Pelvis W Contrast  Result Date: 04/12/2017  CLINICAL DATA:  Pancreatic cancer with duodenal invasion. Now with weakness and hypotension. EXAM: CT ABDOMEN AND PELVIS WITH CONTRAST TECHNIQUE: Multidetector CT imaging of the abdomen and pelvis was performed using the standard protocol following bolus administration of intravenous contrast. CONTRAST:  41mL ISOVUE-300 IOPAMIDOL (ISOVUE-300) INJECTION 61% COMPARISON:  10/13/2016 FINDINGS: Lower chest:  Compressive atelectasis right lung base. Hepatobiliary: Multiple new and enlarging liver metastases are evident. Liver metastases range in size from about 5 mm up to 1 of the more dominant lesions identified in the inferior right liver measuring 3.9 cm (image 35 series 2). Internal external biliary drain is identified, coursing through the common bile duct stent which appears to be in stable positions since prior study. Pancreas: Pancreatic parenchyma is diffusely atrophic and mild diffuse dilatation of the main pancreatic duct is evident. Fullness in the region the pancreatic head is stable. Interval development of thickening in the region of the pancreatic tail now measuring 2.5 cm in AP diameter compared to 1.4 cm previously. Spleen: No splenomegaly. No focal mass lesion. Adrenals/Urinary Tract: No adrenal nodule or mass. Kidneys are unremarkable. No hydronephrosis. No evidence for hydroureter. Urinary bladder is moderately distended. Stomach/Bowel: Stomach is nondilated. Duodenal stent again identified with interval placement of a more proximal overlapping stent in the distal stomach. No evidence for  small bowel dilatation to suggest obstruction. Terminal ileum unremarkable. Appendix is normal in appearance. Colon is nondilated with contrast material seen extending from the ascending colon through to the level of the rectum. Vascular/Lymphatic: The portal vein and superior mesenteric vein are patent. Splenic vein patency cannot be confirmed. There is abdominal aortic atherosclerosis without aneurysm. No gastrohepatic or hepatoduodenal ligament lymphadenopathy. No retroperitoneal lymphadenopathy No pelvic sidewall lymphadenopathy. Reproductive: The prostate gland and seminal vesicles have normal imaging features. Other: Small volume intraperitoneal free fluid noted. Musculoskeletal: Bone windows reveal no worrisome lytic or sclerotic osseous lesions. IMPRESSION: 1. Interval progression of hepatic metastases. 2. Features suggest interval progression of main pancreatic ductal dilatation with new soft tissue fullness identified in the pancreatic tail region. 3. Pole vein and superior mesenteric vein remain patent. Patency of the splenic vein cannot be confirmed on today's study. 4. Interval placement of an overlapping distal gastric stent proximal to the pre-existing duodenal stent. 5. Interval percutaneous internal external biliary drain placement. No substantial biliary dilatation on today's study. Electronically Signed   By: Misty Stanley M.D.   On: 04/12/2017 15:44   Dg Chest Port 1 View  Result Date: 04/12/2017 CLINICAL DATA:  Weakness.  Possible sepsis. EXAM: PORTABLE CHEST 1 VIEW COMPARISON:  02/09/2017 FINDINGS: Right Port-A-Cath which terminates at the low SVC. Right hemidiaphragm elevation is moderate. Midline trachea. Normal heart size. Atherosclerosis in the transverse aorta. No pleural effusion or pneumothorax. Clear lungs. IMPRESSION: No acute cardiopulmonary disease. Aortic Atherosclerosis (ICD10-I70.0). Electronically Signed   By: Abigail Miyamoto M.D.   On: 04/12/2017 11:51        Scheduled  Meds: . heparin  5,000 Units Subcutaneous Q8H  . insulin aspart  0-9 Units Subcutaneous TID WC  . insulin aspart  4 Units Subcutaneous QAC supper  . insulin detemir  40 Units Subcutaneous Daily  . lipase/protease/amylase  36,000 Units Oral TID AC   Continuous Infusions: . ceFEPime (MAXIPIME) IV Stopped (04/14/17 0602)     LOS: 2 days    Munachimso Rigdon Tanna Furry, MD [facility] Pager [phone]  If 7PM-7AM, please contact night-coverage [email or web address] Password [facility] 04/14/2017, 11:29 AM

## 2017-04-15 ENCOUNTER — Inpatient Hospital Stay (HOSPITAL_COMMUNITY): Payer: Medicare Other

## 2017-04-15 ENCOUNTER — Encounter: Payer: Self-pay | Admitting: *Deleted

## 2017-04-15 DIAGNOSIS — Z8042 Family history of malignant neoplasm of prostate: Secondary | ICD-10-CM

## 2017-04-15 DIAGNOSIS — B9689 Other specified bacterial agents as the cause of diseases classified elsewhere: Secondary | ICD-10-CM

## 2017-04-15 DIAGNOSIS — Z8249 Family history of ischemic heart disease and other diseases of the circulatory system: Secondary | ICD-10-CM

## 2017-04-15 DIAGNOSIS — C7989 Secondary malignant neoplasm of other specified sites: Secondary | ICD-10-CM

## 2017-04-15 DIAGNOSIS — Z8619 Personal history of other infectious and parasitic diseases: Secondary | ICD-10-CM

## 2017-04-15 DIAGNOSIS — Z95828 Presence of other vascular implants and grafts: Secondary | ICD-10-CM

## 2017-04-15 DIAGNOSIS — Z800 Family history of malignant neoplasm of digestive organs: Secondary | ICD-10-CM

## 2017-04-15 DIAGNOSIS — Z794 Long term (current) use of insulin: Secondary | ICD-10-CM

## 2017-04-15 DIAGNOSIS — Z801 Family history of malignant neoplasm of trachea, bronchus and lung: Secondary | ICD-10-CM

## 2017-04-15 DIAGNOSIS — C250 Malignant neoplasm of head of pancreas: Secondary | ICD-10-CM

## 2017-04-15 DIAGNOSIS — Z833 Family history of diabetes mellitus: Secondary | ICD-10-CM

## 2017-04-15 DIAGNOSIS — Z885 Allergy status to narcotic agent status: Secondary | ICD-10-CM

## 2017-04-15 DIAGNOSIS — Z978 Presence of other specified devices: Secondary | ICD-10-CM

## 2017-04-15 DIAGNOSIS — R7881 Bacteremia: Secondary | ICD-10-CM

## 2017-04-15 HISTORY — PX: IR CHOLANGIOGRAM EXISTING TUBE: IMG6040

## 2017-04-15 LAB — TYPE AND SCREEN
ABO/RH(D): A POS
ANTIBODY SCREEN: NEGATIVE
UNIT DIVISION: 0
Unit division: 0

## 2017-04-15 LAB — GLUCOSE, CAPILLARY
GLUCOSE-CAPILLARY: 52 mg/dL — AB (ref 65–99)
GLUCOSE-CAPILLARY: 99 mg/dL (ref 65–99)
GLUCOSE-CAPILLARY: 144 mg/dL — AB (ref 65–99)
Glucose-Capillary: 69 mg/dL (ref 65–99)
Glucose-Capillary: 87 mg/dL (ref 65–99)
Glucose-Capillary: 128 mg/dL — ABNORMAL HIGH (ref 65–99)

## 2017-04-15 LAB — COMPREHENSIVE METABOLIC PANEL
ALBUMIN: 2.3 g/dL — AB (ref 3.5–5.0)
ALT: 49 U/L (ref 17–63)
AST: 58 U/L — AB (ref 15–41)
Alkaline Phosphatase: 824 U/L — ABNORMAL HIGH (ref 38–126)
Anion gap: 7 (ref 5–15)
BUN: 15 mg/dL (ref 6–20)
CHLORIDE: 102 mmol/L (ref 101–111)
CO2: 24 mmol/L (ref 22–32)
Calcium: 8 mg/dL — ABNORMAL LOW (ref 8.9–10.3)
Creatinine, Ser: 0.66 mg/dL (ref 0.61–1.24)
GFR calc Af Amer: >60 mL/min (ref >60)
GLUCOSE: 163 mg/dL — AB (ref 65–99)
POTASSIUM: 4.3 mmol/L (ref 3.5–5.1)
SODIUM: 133 mmol/L — AB (ref 135–145)
Total Bilirubin: 1.5 mg/dL — ABNORMAL HIGH (ref 0.3–1.2)
Total Protein: 5.7 g/dL — ABNORMAL LOW (ref 6.5–8.1)

## 2017-04-15 LAB — HEMOGLOBIN A1C
Hgb A1c MFr Bld: 6.6 % — ABNORMAL HIGH (ref 4.8–5.6)
MEAN PLASMA GLUCOSE: 143 mg/dL

## 2017-04-15 LAB — BPAM RBC
BLOOD PRODUCT EXPIRATION DATE: 201807042359
Blood Product Expiration Date: 201807042359
ISSUE DATE / TIME: 201806231452
ISSUE DATE / TIME: 201806231755
UNIT TYPE AND RH: 6200
Unit Type and Rh: 6200

## 2017-04-15 LAB — CULTURE, BLOOD (ROUTINE X 2): SPECIAL REQUESTS: ADEQUATE

## 2017-04-15 LAB — CBC
HEMATOCRIT: 25.6 % — AB (ref 39.0–52.0)
HEMOGLOBIN: 8.8 g/dL — AB (ref 13.0–17.0)
MCH: 30.2 pg (ref 26.0–34.0)
MCHC: 34.4 g/dL (ref 30.0–36.0)
MCV: 88 fL (ref 78.0–100.0)
Platelets: 93 10*3/uL — ABNORMAL LOW (ref 150–400)
RBC: 2.91 MIL/uL — AB (ref 4.22–5.81)
RDW: 17.7 % — ABNORMAL HIGH (ref 11.5–15.5)
WBC: 4.7 10*3/uL (ref 4.0–10.5)

## 2017-04-15 MED ORDER — POTASSIUM CHLORIDE CRYS ER 20 MEQ PO TBCR
40.0000 meq | EXTENDED_RELEASE_TABLET | Freq: Once | ORAL | Status: AC
  Administered 2017-04-15: 40 meq via ORAL
  Filled 2017-04-15: qty 2

## 2017-04-15 MED ORDER — LIDOCAINE HCL 1 % IJ SOLN
INTRAMUSCULAR | Status: AC
  Filled 2017-04-15: qty 20

## 2017-04-15 MED ORDER — VANCOMYCIN 50 MG/ML ORAL SOLUTION
125.0000 mg | Freq: Four times a day (QID) | ORAL | Status: DC
  Administered 2017-04-15 – 2017-04-16 (×5): 125 mg via ORAL
  Filled 2017-04-15 (×6): qty 2.5

## 2017-04-15 MED ORDER — LEVOTHYROXINE SODIUM 25 MCG PO TABS
25.0000 ug | ORAL_TABLET | Freq: Every day | ORAL | Status: DC
  Administered 2017-04-16: 25 ug via ORAL
  Filled 2017-04-15 (×2): qty 1

## 2017-04-15 MED ORDER — SODIUM CHLORIDE 0.9 % IV SOLN: 25.0000 mg | Freq: Once | INTRAVENOUS | Status: DC

## 2017-04-15 MED ORDER — IOPAMIDOL (ISOVUE-300) INJECTION 61%
50.0000 mL | Freq: Once | INTRAVENOUS | Status: AC | PRN
  Administered 2017-04-15: 15 mL

## 2017-04-15 MED ORDER — PROMETHAZINE HCL 25 MG/ML IJ SOLN
INTRAMUSCULAR | Status: AC
  Administered 2017-04-15: 25 mg via INTRAVENOUS
  Filled 2017-04-15: qty 1

## 2017-04-15 MED ORDER — PROMETHAZINE HCL 25 MG/ML IJ SOLN
25.0000 mg | Freq: Once | INTRAMUSCULAR | Status: AC
  Administered 2017-04-15: 25 mg via INTRAVENOUS

## 2017-04-15 MED ORDER — FENTANYL CITRATE (PF) 100 MCG/2ML IJ SOLN
50.0000 ug | Freq: Once | INTRAMUSCULAR | Status: AC
  Administered 2017-04-15: 100 ug via INTRAVENOUS

## 2017-04-15 MED ORDER — ZOLPIDEM TARTRATE 5 MG PO TABS
5.0000 mg | ORAL_TABLET | Freq: Every evening | ORAL | Status: DC | PRN
  Administered 2017-04-15: 5 mg via ORAL
  Filled 2017-04-15: qty 1

## 2017-04-15 MED ORDER — IOPAMIDOL (ISOVUE-300) INJECTION 61%
INTRAVENOUS | Status: AC
  Administered 2017-04-15: 15 mL
  Filled 2017-04-15: qty 50

## 2017-04-15 MED ORDER — LIDOCAINE HCL 1 % IJ SOLN
INTRAMUSCULAR | Status: DC | PRN
  Administered 2017-04-15: 5 mL via INTRADERMAL

## 2017-04-15 MED ORDER — INSULIN DETEMIR 100 UNIT/ML ~~LOC~~ SOLN
30.0000 [IU] | Freq: Every day | SUBCUTANEOUS | Status: DC
  Administered 2017-04-15 – 2017-04-16 (×2): 30 [IU] via SUBCUTANEOUS
  Filled 2017-04-15 (×2): qty 0.3

## 2017-04-15 MED ORDER — FENTANYL CITRATE (PF) 100 MCG/2ML IJ SOLN
INTRAMUSCULAR | Status: AC
  Administered 2017-04-15: 100 ug via INTRAVENOUS
  Filled 2017-04-15: qty 2
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
## 2017-04-15 NOTE — Progress Notes (Signed)
CRITICAL VALUE ALERT  Critical Value:  CBG 69  Date & Time Notied:  04/15/17 @ 4383  Provider Notified: Yes  Orders Received/Actions taken: Re-check Blood Glucose in an hour

## 2017-04-15 NOTE — Procedures (Signed)
Interventional Radiology Procedure Note  Procedure: cholangiogram through existing int/ext drain.  Exchange of int/ext drain for new 63F int/ext drain. To gravity  Complications: None Recommendations:  - continue to flush drain once daily - to gravity drain - on schedule for interval routine drain exchange in august  Signed,  Kenyada Hy S. Earleen Newport, DO

## 2017-04-15 NOTE — Progress Notes (Signed)
   04/15/17 1118  Clinical Encounter Type  Visited With Patient;Family  Visit Type Initial;Psychological support;Spiritual support;Social support  Referral From Chaplain  Consult/Referral To Chaplain  Stress Factors  Patient Stress Factors Health changes

## 2017-04-15 NOTE — Progress Notes (Signed)
Referring Physician(s): Stark,M  Supervising Physician: Corrie Mckusick  Patient Status:  [facility] - In-pt  Chief Complaint:  Pancreatic cancer, biliary obstruction, bacteremia  Subjective: Pt recently admitted with klebsiella/enterobacter bacteremia post endo/duod stent 6/20; s/p conversion of ext biliary drain for I/E drain 03/20/17; failed biliary drain capping trial ;currently denies abd pain,N/V; not much of an appetite,+BM's   Allergies: Codeine  Medications: Prior to Admission medications   Medication Sig Start Date End Date Taking? Authorizing Provider  acetaminophen (TYLENOL) 500 MG tablet Take 1,000 mg by mouth every 6 (six) hours as needed.   Yes [provider]  baclofen (LIORESAL) 10 MG tablet Take 5 mg by mouth 3 (three) times daily as needed (for hiccups).   Yes [provider]  ibuprofen (ADVIL,MOTRIN) 200 MG tablet Take 400 mg by mouth every 6 (six) hours as needed.   Yes [provider]  insulin aspart (NOVOLOG FLEXPEN) 100 UNIT/ML FlexPen Inject 4 Units into the skin daily before supper.   Yes [provider]  Insulin Detemir (LEVEMIR FLEXTOUCH) 100 UNIT/ML Pen Inject 40 Units into the skin daily. 03/21/17  Yes [name], MD  lidocaine-prilocaine (EMLA) cream Apply 1 application topically as needed (prior to accessing port).   Yes [provider]  lipase/protease/amylase (CREON) 36000 UNITS CPEP capsule Take 1 capsule (36,000 Units total) by mouth 3 (three) times daily before meals. 02/22/17  Yes Ladell Pier, MD  loratadine (CLARITIN) 10 MG tablet Take 10 mg by mouth daily.    Yes [provider]  Multiple Vitamin (MULTIVITAMIN WITH MINERALS) TABS tablet Take 1 tablet by mouth daily.   Yes [provider]  ondansetron (ZOFRAN) 8 MG tablet Take 1 tablet (8 mg total) by mouth every 8 (eight) hours as needed for nausea or vomiting. 03/05/17  Yes Owens Shark, NP  polyethylene glycol (MIRALAX /  GLYCOLAX) packet Take 17 g by mouth daily as needed for mild constipation.    Yes [provider]  prochlorperazine (COMPAZINE) 10 MG tablet Take 1 tablet (10 mg total) by mouth every 6 (six) hours as needed for nausea or vomiting. 03/05/17  Yes Owens Shark, NP  BD PEN NEEDLE NANO U/F 32G X 4 MM MISC USE TO INJECT INSULIN TWICE A DAY 11/06/16   [name], MD  ciprofloxacin (CIPRO) 500 MG tablet Take 1 tablet (500 mg total) by mouth 2 (two) times daily. Patient not taking: Reported on 04/12/2017 04/01/17   Owens Shark, NP  hydrOXYzine (ATARAX/VISTARIL) 25 MG tablet Take 1 tablet (25 mg total) by mouth every 8 (eight) hours as needed for itching. Patient not taking: Reported on 04/12/2017 12/06/16   Ladell Pier, MD  traMADol (ULTRAM) 50 MG tablet Take 1 tablet (50 mg total) by mouth every 6 (six) hours as needed. Patient not taking: Reported on 04/12/2017 02/15/17   Theodis Blaze, MD     Vital Signs: BP 123/83 (BP Location: Right Arm)   Pulse 71   Temp 99.3 F (37.4 C) (Oral)   Resp 18   Ht 6' (1.829 m)   Wt 148 lb 9.4 oz (67.4 kg)   SpO2 98%   BMI 20.15 kg/m   Physical Exam RUQ drain intact, output minimal; dressing dry,site NT; resistance met with NS flush  Imaging: Dg Abd 1 View  Result Date: 04/14/2017 CLINICAL DATA:  Abdominal discomfort.  pancreatic stent placement EXAM: ABDOMEN - 1 VIEW COMPARISON:  CT 04/12/2017 FINDINGS: Duodenum stent noted. Common  bile duct stent noted. Percutaneous biliary stent extends through the common bile duct. Stent in the duodenum. No dilated to large or small bowel. Moderate volume stool throughout the colon. IMPRESSION: 1. Biliary drains and stents appear in proper orientation. Duodenum stent noted. 2. No evidence of bowel obstruction. 3. Moderate volume stool throughout the suggests constipation. Electronically Signed   By: Suzy Bouchard M.D.   On: 04/14/2017 09:52   Ct Abdomen Pelvis W Contrast  Result Date:  04/12/2017 CLINICAL DATA:  Pancreatic cancer with duodenal invasion. Now with weakness and hypotension. EXAM: CT ABDOMEN AND PELVIS WITH CONTRAST TECHNIQUE: Multidetector CT imaging of the abdomen and pelvis was performed using the standard protocol following bolus administration of intravenous contrast. CONTRAST:  53m ISOVUE-300 IOPAMIDOL (ISOVUE-300) INJECTION 61% COMPARISON:  10/13/2016 FINDINGS: Lower chest:  Compressive atelectasis right lung base. Hepatobiliary: Multiple new and enlarging liver metastases are evident. Liver metastases range in size from about 5 mm up to 1 of the more dominant lesions identified in the inferior right liver measuring 3.9 cm (image 35 series 2). Internal external biliary drain is identified, coursing through the common bile duct stent which appears to be in stable positions since prior study. Pancreas: Pancreatic parenchyma is diffusely atrophic and mild diffuse dilatation of the main pancreatic duct is evident. Fullness in the region the pancreatic head is stable. Interval development of thickening in the region of the pancreatic tail now measuring 2.5 cm in AP diameter compared to 1.4 cm previously. Spleen: No splenomegaly. No focal mass lesion. Adrenals/Urinary Tract: No adrenal nodule or mass. Kidneys are unremarkable. No hydronephrosis. No evidence for hydroureter. Urinary bladder is moderately distended. Stomach/Bowel: Stomach is nondilated. Duodenal stent again identified with interval placement of a more proximal overlapping stent in the distal stomach. No evidence for small bowel dilatation to suggest obstruction. Terminal ileum unremarkable. Appendix is normal in appearance. Colon is nondilated with contrast material seen extending from the ascending colon through to the level of the rectum. Vascular/Lymphatic: The portal vein and superior mesenteric vein are patent. Splenic vein patency cannot be confirmed. There is abdominal aortic atherosclerosis without aneurysm.  No gastrohepatic or hepatoduodenal ligament lymphadenopathy. No retroperitoneal lymphadenopathy No pelvic sidewall lymphadenopathy. Reproductive: The prostate gland and seminal vesicles have normal imaging features. Other: Small volume intraperitoneal free fluid noted. Musculoskeletal: Bone windows reveal no worrisome lytic or sclerotic osseous lesions. IMPRESSION: 1. Interval progression of hepatic metastases. 2. Features suggest interval progression of main pancreatic ductal dilatation with new soft tissue fullness identified in the pancreatic tail region. 3. Pole vein and superior mesenteric vein remain patent. Patency of the splenic vein cannot be confirmed on today's study. 4. Interval placement of an overlapping distal gastric stent proximal to the pre-existing duodenal stent. 5. Interval percutaneous internal external biliary drain placement. No substantial biliary dilatation on today's study. Electronically Signed   By: EMisty StanleyM.D.   On: 04/12/2017 15:44   Dg Chest Port 1 View  Result Date: 04/12/2017 CLINICAL DATA:  Weakness.  Possible sepsis. EXAM: PORTABLE CHEST 1 VIEW COMPARISON:  02/09/2017 FINDINGS: Right Port-A-Cath which terminates at the low SVC. Right hemidiaphragm elevation is moderate. Midline trachea. Normal heart size. Atherosclerosis in the transverse aorta. No pleural effusion or pneumothorax. Clear lungs. IMPRESSION: No acute cardiopulmonary disease. Aortic Atherosclerosis (ICD10-I70.0). Electronically Signed   By: KAbigail MiyamotoM.D.   On: 04/12/2017 11:51    Labs:  CBC:  Recent Labs  04/12/17 0950 04/13/17 0335 04/14/17 0102 04/15/17 0424  WBC 8.1 8.6 8.4  4.7  HGB 7.3* 7.0* 9.1* 8.8*  HCT 21.7* 21.0* 26.3* 25.6*  PLT 123* 120* 115* 93*    COAGS:  Recent Labs  10/12/16 2021 11/07/16 0945 02/09/17 0751 03/20/17 1350 04/12/17 2000  INR 0.98 1.01 1.01 1.14 1.20  APTT 30  --   --  33  --     BMP:  Recent Labs  04/12/17 0950 04/13/17 0335  04/14/17 0102 04/15/17 1206  NA 131* 135 134* 133*  K 3.7 3.9 3.5 4.3  CL 98* 104 103 102  CO2 '26 24 23 24  '$ GLUCOSE 194* 238* 110* 163*  BUN '14 11 18 15  '$ CALCIUM 8.0* 7.7* 8.0* 8.0*  CREATININE 0.74 0.76 0.71 0.66  GFRNONAA >60 >60 >60 >60  GFRAA >60 >60 >60 >60    LIVER FUNCTION TESTS:  Recent Labs  04/01/17 0756 04/12/17 1526 04/14/17 0102 04/15/17 1206  BILITOT 0.83 2.5* 2.5* 1.5*  AST 65* 71* 60* 58*  ALT 53 61 53 49  ALKPHOS 1,313* 937* 736* 824*  PROT 5.8* 5.6* 5.2* 5.7*  ALBUMIN 2.2* 2.5* 2.2* 2.3*    Assessment and Plan: Pt with hx panc ca/obstructive jaundice; s/p I/E biliary drain 11/25/15,biliary stent 12/13/15, last exchange 03/20/17; failed drain capping trial; s/p endo with duod stent 6/20; klebsiella/entrobacter in blood cx's- ID following; AF; WBC nl; hgb 8.8, t bili 1.5(2.5); abd film with stable postioning of biliary drain, mod stool;some resistance met with NS flush of drain; will discuss follow up cholangiogram/poss exchange with Dr. Earleen Newport.   Electronically Signed: D. Rowe Ryson, PA-C 04/15/2017, 2:47 PM   I spent a total of 20 minutes at the the patient's bedside AND on the patient's hospital floor or unit, greater than 50% of which was counseling/coordinating care for biliary drain    Patient ID: Jacob Hardin, male   DOB: Jul 03, 1946, 71 y.o.   MRN: [id number]

## 2017-04-15 NOTE — Progress Notes (Addendum)
PROGRESS NOTE    Jacob Hardin  OFB:[id number] DOB: [birth year] DOA: 04/12/2017 PCP: [name], MD   Brief Narrative: 71 year old male with metastatic pancreatic cancer infiltrating invading duodenum,  status post duodenal stent and pancreatic duct stent placed by Dr. [name] on 04/10/2017, presented with a fever, generalized weakness and not feeling well. Patient had a similar problem after his stent placement in the past when he was found to have sepsis. In the ER, patient had temperature of 102.6 and lactic acid level of 9.1. CT scan of abdomen consistent with progression of hepatic metastasis with no problem and stent. Started on vancomycin and Zosyn and admitted for further evaluation.  Assessment & Plan:  #  Sepsis due to Klebsiella bacteremia: -Patient recently had a GI procedure including stent placement of duodenum. Blood culture growing Klebsiella in one bottle.  -Continue cefepime IV. -Patient is afebrile today. Improving clinically. Continue supportive care. -ID consult requested and discussed with Dr. Megan Salon. Patient has a port on his chest with no surrounding tenderness or redness. Patient may need long-term antibiotics.   #Metastatic pancreatic cancer: Patient follows up with his oncologist Dr.Sherrill. As per patient and his wife, Dr.Sherrill knows about patient being in the hospital. Recommended outpatient follow-up.  #Gastric outlet obstruction: As per prior record it was caused by duodenal bulb fungating mass from the metastatic cancer. The duodenal stent was placed on June 20.  -GI and IR consult appreciated. Abdomen x-ray showed stent in placed with no complication. Patient is clinically improving. Continue Creon and supportive care. Recommended to follow-up with GI outpatient.  # Diarrhea: Diarrhea improved.  #Type 2 diabetes with hypoglycemia: I reduced the dose of Levemir to 30 units today. Patient has good oral intake. Monitor blood sugar  level.  #Possible moderate protein calorie malnutrition: Dietary referral  #Normocytic anemia and thrombocytopenia: Received 2 units of red blood cell transfusion. Hemoglobin mildly dropped to 8.8 with trending down in a pattern count to 93. Repeat CBC in am to monitor Hb and platelet counts. I discussed with pt's wife over the phone to update.   #TSH level elevated associated with a low free T3 and T4 consistent with hypothyroidism. I will start Synthroid. Recommended to follow up with PCP.  Principal Problem:   Sepsis (Jacob Hardin) Active Problems:   Diabetes mellitus without complication ([place])   Hypertension   Cancer of head of pancreas ([place])   Primary pancreatic cancer with metastasis to other site [facility])   Bacteremia due to Klebsiella pneumoniae   Malnutrition of moderate degree   Bacteremia due to Enterobacter species   History of Clostridium difficile colitis  DVT prophylaxis: SCD. Discontinue heparin because of severe anemia, thrombocytopenia Code Status: DO NOT RESUSCITATE Family Communication: discussed with the patient's wife at bedside Disposition Plan: likely discharge home in 1-2 days    Consultants:   GI consult to Dr. Fuller Plan  ID  IR  Procedures: CT scan of abdomen and pelvis Antimicrobials:  IV vancomycin since June 22-23 IV cefepime since June 22 Subjective:  Seen and examined at bedside. Feeling good. Denied headache, dizziness, nausea vomiting chest pain or shortness of breath. He is ambulating in the hallway with his wife.   Objective: Vitals:   04/14/17 0404 04/14/17 1334 04/14/17 2023 04/15/17 0521  BP: 104/70 104/70 112/73 115/77  Pulse: 74 69 65 71  Resp: 18 16 18 20   Temp: 97.8 F (36.6 C) 97.7 F (36.5 C) 98.2 F (36.8 C) 97.8 F (36.6 C)  TempSrc: Oral Oral Oral  Oral  SpO2: 98% 100% 97% 99%  Weight:      Height:        Intake/Output Summary (Last 24 hours) at 04/15/17 1311 Last data filed at 04/15/17 0604  Gross per 24 hour  Intake               750 ml  Output               10 ml  Net              740 ml   Filed Weights   04/12/17 0939 04/12/17 1856  Weight: 63.5 kg (140 lb) 67.4 kg (148 lb 9.4 oz)    Examination:  General exam: Not in distress Respiratory system: Clear bilaterally, respiratory effort normal. No wheezing Cardiovascular system: Regular rate and rhythm, S1 is normal. No pedal edema Gastrointestinal system: Abdomen soft, nontender, nondistended. Bowel sound positive Central nervous system: Alert and oriented. No focal neurological deficits. Skin: No rashes, lesions or ulcers Psychiatry: Judgement and insight appear normal. Mood & affect appropriate.     Data Reviewed: I have personally reviewed following labs and imaging studies  CBC:  Recent Labs Lab 04/12/17 0950 04/13/17 0335 04/14/17 0102 04/15/17 0424  WBC 8.1 8.6 8.4 4.7  NEUTROABS 7.6  --   --   --   HGB 7.3* 7.0* 9.1* 8.8*  HCT 21.7* 21.0* 26.3* 25.6*  MCV 90.0 92.1 88.0 88.0  PLT 123* 120* 115* 93*   Basic Metabolic Panel:  Recent Labs Lab 04/12/17 0950 04/13/17 0335 04/14/17 0102 04/15/17 1206  NA 131* 135 134* 133*  K 3.7 3.9 3.5 4.3  CL 98* 104 103 102  CO2 26 24 23 24   GLUCOSE 194* 238* 110* 163*  BUN 14 11 18 15   CREATININE 0.74 0.76 0.71 0.66  CALCIUM 8.0* 7.7* 8.0* 8.0*   GFR: Estimated Creatinine Clearance: 83.1 mL/min (by C-G formula based on SCr of 0.66 mg/dL). Liver Function Tests:  Recent Labs Lab 04/12/17 1526 04/14/17 0102 04/15/17 1206  AST 71* 60* 58*  ALT 61 53 49  ALKPHOS 937* 736* 824*  BILITOT 2.5* 2.5* 1.5*  PROT 5.6* 5.2* 5.7*  ALBUMIN 2.5* 2.2* 2.3*   No results for input(s): LIPASE, AMYLASE in the last 168 hours. No results for input(s): AMMONIA in the last 168 hours. Coagulation Profile:  Recent Labs Lab 04/12/17 2000  INR 1.20   Cardiac Enzymes: No results for input(s): CKTOTAL, CKMB, CKMBINDEX, TROPONINI in the last 168 hours. BNP (last 3 results) No results for  input(s): PROBNP in the last 8760 hours. HbA1C:  Recent Labs  04/12/17 2000  HGBA1C 6.6*   CBG:  Recent Labs Lab 04/15/17 0108 04/15/17 0243 04/15/17 0750 04/15/17 0906 04/15/17 1207  GLUCAP 69 87 52* 99 144*   Lipid Profile: No results for input(s): CHOL, HDL, LDLCALC, TRIG, CHOLHDL, LDLDIRECT in the last 72 hours. Thyroid Function Tests:  Recent Labs  04/12/17 2000 04/13/17 1009  TSH 24.043*  --   FREET4  --  0.60*  T3FREE  --  1.1*   Anemia Panel: No results for input(s): VITAMINB12, FOLATE, FERRITIN, TIBC, IRON, RETICCTPCT in the last 72 hours. Sepsis Labs:  Recent Labs Lab 04/12/17 1409 04/12/17 1526 04/12/17 1541 04/14/17 0102  LATICACIDVEN 9.17* 4.0* 4.18* 1.8    Recent Results (from the past 240 hour(s))  Blood culture (routine x 2)     Status: None (Preliminary result)   Collection Time: 04/12/17 11:25 AM  Result Value Ref  Range Status   Specimen Description BLOOD RIGHT FOREARM  Final   Special Requests   Final    BOTTLES DRAWN AEROBIC AND ANAEROBIC Blood Culture adequate volume   Culture   Final    NO GROWTH 3 DAYS Performed at [facility], [address] [place], [place] [address]    Report Status PENDING  Incomplete  Blood culture (routine x 2)     Status: Abnormal (Preliminary result)   Collection Time: 04/12/17 11:26 AM  Result Value Ref Range Status   Specimen Description BLOOD RIGHT HAND  Final   Special Requests   Final    BOTTLES DRAWN AEROBIC AND ANAEROBIC Blood Culture adequate volume   Culture  Setup Time   Final    GRAM NEGATIVE RODS IN BOTH AEROBIC AND ANAEROBIC BOTTLES CRITICAL RESULT CALLED TO, READ BACK BY AND VERIFIED WITH: Lavell Luster PHARMD 0121 04/13/17 A BROWNING    Culture (A)  Final    KLEBSIELLA PNEUMONIAE GRAM NEGATIVE RODS CULTURE REINCUBATED FOR BETTER GROWTH Performed at [facility], [place] [address] [place], [place] [address]    Report Status PENDING  Incomplete   Organism ID, Bacteria  KLEBSIELLA PNEUMONIAE  Final      Susceptibility   Klebsiella pneumoniae - MIC*    AMPICILLIN >=32 RESISTANT Resistant     CEFAZOLIN <=4 SENSITIVE Sensitive     CEFEPIME <=1 SENSITIVE Sensitive     CEFTAZIDIME <=1 SENSITIVE Sensitive     CEFTRIAXONE <=1 SENSITIVE Sensitive     CIPROFLOXACIN <=0.25 SENSITIVE Sensitive     GENTAMICIN <=1 SENSITIVE Sensitive     IMIPENEM <=0.25 SENSITIVE Sensitive     TRIMETH/SULFA <=20 SENSITIVE Sensitive     AMPICILLIN/SULBACTAM 4 SENSITIVE Sensitive     PIP/TAZO <=4 SENSITIVE Sensitive     Extended ESBL NEGATIVE Sensitive     * KLEBSIELLA PNEUMONIAE  Blood Culture ID Panel (Reflexed)     Status: Abnormal   Collection Time: 04/12/17 11:26 AM  Result Value Ref Range Status   Enterococcus species NOT DETECTED NOT DETECTED Final   Listeria monocytogenes NOT DETECTED NOT DETECTED Final   Staphylococcus species NOT DETECTED NOT DETECTED Final   Staphylococcus aureus NOT DETECTED NOT DETECTED Final   Streptococcus species NOT DETECTED NOT DETECTED Final   Streptococcus agalactiae NOT DETECTED NOT DETECTED Final   Streptococcus pneumoniae NOT DETECTED NOT DETECTED Final   Streptococcus pyogenes NOT DETECTED NOT DETECTED Final   Acinetobacter baumannii NOT DETECTED NOT DETECTED Final   Enterobacteriaceae species DETECTED (A) NOT DETECTED Final    Comment: CRITICAL RESULT CALLED TO, READ BACK BY AND VERIFIED WITH: J GRIMSLEY PHARMD 0121 04/13/17 A BROWNING    Enterobacter cloacae complex DETECTED (A) NOT DETECTED Final    Comment: CRITICAL RESULT CALLED TO, READ BACK BY AND VERIFIED WITH: Lavell Luster PHARMD 0121 04/13/17 A BROWNING    Escherichia coli NOT DETECTED NOT DETECTED Final   Klebsiella oxytoca NOT DETECTED NOT DETECTED Final   Klebsiella pneumoniae DETECTED (A) NOT DETECTED Final    Comment: CRITICAL RESULT CALLED TO, READ BACK BY AND VERIFIED WITH: Lavell Luster PHARMD 0121 04/13/17 A BROWNING    Proteus species NOT DETECTED NOT DETECTED Final    Serratia marcescens NOT DETECTED NOT DETECTED Final   Carbapenem resistance NOT DETECTED NOT DETECTED Final   Haemophilus influenzae NOT DETECTED NOT DETECTED Final   Neisseria meningitidis NOT DETECTED NOT DETECTED Final   Pseudomonas aeruginosa NOT DETECTED NOT DETECTED Final   Candida albicans NOT  DETECTED NOT DETECTED Final   Candida glabrata NOT DETECTED NOT DETECTED Final   Candida krusei NOT DETECTED NOT DETECTED Final   Candida parapsilosis NOT DETECTED NOT DETECTED Final   Candida tropicalis NOT DETECTED NOT DETECTED Final    Comment: Performed at [facility], [place] [address] [place], [place] [address]  Urine Culture     Status: None   Collection Time: 04/12/17  4:09 PM  Result Value Ref Range Status   Specimen Description URINE, CLEAN CATCH  Final   Special Requests NONE  Final   Culture   Final    NO GROWTH Performed at [facility], [place] [address] [place], [place] [address]    Report Status 04/14/2017 FINAL  Final         Radiology Studies: Dg Abd 1 View  Result Date: 04/14/2017 CLINICAL DATA:  Abdominal discomfort.  pancreatic stent placement EXAM: ABDOMEN - 1 VIEW COMPARISON:  CT 04/12/2017 FINDINGS: Duodenum stent noted. Common bile duct stent noted. Percutaneous biliary stent extends through the common bile duct. Stent in the duodenum. No dilated to large or small bowel. Moderate volume stool throughout the colon. IMPRESSION: 1. Biliary drains and stents appear in proper orientation. Duodenum stent noted. 2. No evidence of bowel obstruction. 3. Moderate volume stool throughout the suggests constipation. Electronically Signed   By: Suzy Bouchard M.D.   On: 04/14/2017 09:52        Scheduled Meds: . heparin  5,000 Units Subcutaneous Q8H  . insulin aspart  0-9 Units Subcutaneous TID WC  . insulin aspart  4 Units Subcutaneous QAC supper  . insulin detemir  30 Units Subcutaneous Daily  . lipase/protease/amylase  36,000 Units Oral TID AC    Continuous Infusions: . ceFEPime (MAXIPIME) IV Stopped (04/15/17 4462)     LOS: 3 days    Amarachi Kotz Tanna Furry, MD [facility] Pager [phone]  If 7PM-7AM, please contact night-coverage [email or web address] Password [email or web address] 04/15/2017, 1:11 PM

## 2017-04-15 NOTE — Progress Notes (Signed)
Subjective: Feeling better.  He is able to tolerate PO.  Objective: Vital signs in last 24 hours: Temp:  [97.8 F (36.6 C)-99.3 F (37.4 C)] 98.7 F (37.1 C) (06/25 1822) Pulse Rate:  [65-81] 81 (06/25 1822) Resp:  [17-20] 17 (06/25 1822) BP: (112-137)/(73-91) 137/91 (06/25 1822) SpO2:  [97 %-99 %] 99 % (06/25 1822) Last BM Date: 04/13/17  Intake/Output from previous day: 06/24 0701 - 06/25 0700 In: 750 [P.O.:600; IV Piggyback:150] Out: 10 [Drains:10] Intake/Output this shift: No intake/output data recorded.  General appearance: alert and no distress GI: soft, non-tender; bowel sounds normal; no masses,  no organomegaly  Lab Results:  Recent Labs  04/13/17 0335 04/14/17 0102 04/15/17 0424  WBC 8.6 8.4 4.7  HGB 7.0* 9.1* 8.8*  HCT 21.0* 26.3* 25.6*  PLT 120* 115* 93*   BMET  Recent Labs  04/13/17 0335 04/14/17 0102 04/15/17 1206  NA 135 134* 133*  K 3.9 3.5 4.3  CL 104 103 102  CO2 24 23 24   GLUCOSE 238* 110* 163*  BUN 11 18 15   CREATININE 0.76 0.71 0.66  CALCIUM 7.7* 8.0* 8.0*   LFT  Recent Labs  04/15/17 1206  PROT 5.7*  ALBUMIN 2.3*  AST 58*  ALT 49  ALKPHOS 824*  BILITOT 1.5*   PT/INR  Recent Labs  04/12/17 2000  LABPROT 15.3*  INR 1.20   Hepatitis Panel No results for input(s): HEPBSAG, HCVAB, HEPAIGM, HEPBIGM in the last 72 hours. C-Diff No results for input(s): CDIFFTOX in the last 72 hours. Fecal Lactopherrin No results for input(s): FECLLACTOFRN in the last 72 hours.  Studies/Results: Dg Abd 1 View  Result Date: 04/14/2017 CLINICAL DATA:  Abdominal discomfort.  pancreatic stent placement EXAM: ABDOMEN - 1 VIEW COMPARISON:  CT 04/12/2017 FINDINGS: Duodenum stent noted. Common bile duct stent noted. Percutaneous biliary stent extends through the common bile duct. Stent in the duodenum. No dilated to large or small bowel. Moderate volume stool throughout the colon. IMPRESSION: 1. Biliary drains and stents appear in proper  orientation. Duodenum stent noted. 2. No evidence of bowel obstruction. 3. Moderate volume stool throughout the suggests constipation. Electronically Signed   By: Suzy Bouchard M.D.   On: 04/14/2017 09:52    Medications:  Scheduled: . heparin  5,000 Units Subcutaneous Q8H  . insulin aspart  0-9 Units Subcutaneous TID WC  . insulin aspart  4 Units Subcutaneous QAC supper  . insulin detemir  30 Units Subcutaneous Daily  . levothyroxine  25 mcg Oral QAC breakfast  . lidocaine      . lipase/protease/amylase  36,000 Units Oral TID AC  . vancomycin  125 mg Oral Q6H   Continuous: . ceFEPime (MAXIPIME) IV Stopped (04/15/17 1438)    Assessment/Plan: 1) Metastatic pancreatic cancer. 2) Klebsiella and Enterobacter bacteremia. 3) GOO secondary to pancreatic cancer - resolved with overlapping stents.   From the GI standpoint he is well.  The overlapping stents are in excellent position and he is tolerating PO without any difficulty.  Plan: 1) No further GI intervention. 2) Bacteremia treatment per ID and IR.  LOS: 3 days   Asees Manfredi D 04/15/2017, 7:04 PM

## 2017-04-16 ENCOUNTER — Encounter (HOSPITAL_COMMUNITY): Payer: Self-pay | Admitting: Interventional Radiology

## 2017-04-16 DIAGNOSIS — Z8619 Personal history of other infectious and parasitic diseases: Secondary | ICD-10-CM

## 2017-04-16 LAB — GLUCOSE, CAPILLARY
GLUCOSE-CAPILLARY: 133 mg/dL — AB (ref 65–99)
GLUCOSE-CAPILLARY: 176 mg/dL — AB (ref 65–99)
GLUCOSE-CAPILLARY: 238 mg/dL — AB (ref 65–99)
GLUCOSE-CAPILLARY: 50 mg/dL — AB (ref 65–99)
GLUCOSE-CAPILLARY: 80 mg/dL (ref 65–99)

## 2017-04-16 LAB — CBC
HEMATOCRIT: 27.1 % — AB (ref 39.0–52.0)
HEMOGLOBIN: 9.2 g/dL — AB (ref 13.0–17.0)
MCH: 30.2 pg (ref 26.0–34.0)
MCHC: 33.9 g/dL (ref 30.0–36.0)
MCV: 88.9 fL (ref 78.0–100.0)
Platelets: 113 10*3/uL — ABNORMAL LOW (ref 150–400)
RBC: 3.05 MIL/uL — AB (ref 4.22–5.81)
RDW: 17.6 % — ABNORMAL HIGH (ref 11.5–15.5)
WBC: 4.9 10*3/uL (ref 4.0–10.5)

## 2017-04-16 MED ORDER — VANCOMYCIN 50 MG/ML ORAL SOLUTION: 125.0000 mg | Freq: Four times a day (QID) | ORAL | 0 refills | Status: AC

## 2017-04-16 MED ORDER — INSULIN DETEMIR 100 UNIT/ML FLEXPEN: 25.0000 [IU] | PEN_INJECTOR | Freq: Every day | SUBCUTANEOUS | 0 refills | Status: AC

## 2017-04-16 MED ORDER — SULFAMETHOXAZOLE-TRIMETHOPRIM 800-160 MG PO TABS: 1.0000 | ORAL_TABLET | Freq: Two times a day (BID) | ORAL | 0 refills | Status: DC

## 2017-04-16 MED ORDER — HEPARIN SOD (PORK) LOCK FLUSH 100 UNIT/ML IV SOLN
500.0000 [IU] | INTRAVENOUS | Status: AC | PRN
  Administered 2017-04-16: 500 [IU]

## 2017-04-16 MED ORDER — LEVOTHYROXINE SODIUM 25 MCG PO TABS: 25.0000 ug | ORAL_TABLET | Freq: Every day | ORAL | 0 refills | Status: DC

## 2017-04-16 NOTE — Care Management Note (Signed)
Case Management Note  Patient Details  Name: Jacob Hardin MRN: [id number] Date of Birth: 1946-10-09  Subjective/Objective: d/c home today. Indep w/biliary drain, po abx. No CM needs.                    Action/Plan:d/c home.   Expected Discharge Date:  04/16/17               Expected Discharge Plan:  Home/Self Care  In-House Referral:     Discharge planning Services  CM Consult  Post Acute Care Choice:    Choice offered to:     DME Arranged:    DME Agency:     HH Arranged:    HH Agency:     Status of Service:  Completed, signed off  If discussed at [employer] of Stay Meetings, dates discussed:    Additional Comments:  Dessa Phi, RN 04/16/2017, 10:47 AM

## 2017-04-16 NOTE — Discharge Summary (Addendum)
Physician Discharge Summary  Jacob Hardin WNI:[id number] DOB: 1946-03-15 DOA: 04/12/2017  PCP: [name], MD  Admit date: 04/12/2017 Discharge date: 04/16/2017  Admitted From:home Disposition:home  Recommendations for Outpatient Follow-up:  1. Follow up with PCP in 1-2 weeks 2. Please obtain BMP/CBC in one week 3. Please check thyroid function test with your PCP in 4-6 weeks.   Home Health:no Equipment/Devices:has biliary drain Discharge Condition:stable CODE STATUS:dnr Diet recommendation:carb modified heart healthy diet  Brief/Interim Summary: 71 year old male with metastatic pancreatic cancer infiltrating invading duodenum,  status post duodenal stent and pancreatic duct stent placed by Dr. [name] on 04/10/2017, presented with a fever, generalized weakness and not feeling well. Patient had a similar problem after his stent placement in the past when he was found to have sepsis. In the ER, patient had temperature of 102.6 and lactic acid level of 9.1. CT scan of abdomen consistent with progression of hepatic metastasis with no problem and stent. Started on vancomycin and Zosyn and admitted for further evaluation.  #  Sepsis due to Klebsiella bacteremia: -Patient recently had a GI procedure including stent placement of duodenum. Blood culture growing Klebsiella in one bottle.  -treated with cefepime IV. -Patient is clinically improved. He is afebrile. Symptoms improved and able to tolerate diet well. Evaluated by infectious disease recommended to discharge with Bactrim for a week as well as oral vancomycin as prophylaxis for C Diff 2 weeks. Patient has a history of C diff diarrhea in the past.  #Metastatic pancreatic cancer: Patient follows up with his oncologist Dr.Sherrill.  Recommended outpatient follow-up.  #Gastric outlet obstruction: As per prior record it was caused by duodenal bulb fungating mass from the metastatic cancer. The duodenal stent was placed on June 20.   -GI and IR consult appreciated. Abdomen x-ray showed stent in placed with no complication. Patient is clinically improving. Continue Creon and supportive care. Recommended to follow-up with GI outpatient. GI evaluated the patient.  # Diarrhea: Diarrhea improved.  #Type 2 diabetes with hypoglycemia: Reduced the dose of Levemir. Recommended to monitor blood sugar level at home and follow-up with PCP.  #Possible moderate protein calorie malnutrition: Dietary referral  #Normocytic anemia, unspecified ?IDA and thrombocytopenia: Received 2 units of red blood cell transfusion. Hemoglobin stable today. Platelet count improved. I recommended patient to follow-up with PCP to monitor labs. He verbalized understanding.  #TSH level elevated associated with a low free T3 and T4 consistent with hypothyroidism.Started oral Synthroid. Recommended to follow up with PCP.  Discharge Diagnoses:  Principal Problem:   Sepsis ([place]) Active Problems:   Diabetes mellitus without complication ([place])   Hypertension   Cancer of head of pancreas ([place])   Primary pancreatic cancer with metastasis to other site [facility])   Bacteremia due to Klebsiella pneumoniae   Malnutrition of moderate degree   Bacteremia due to Enterobacter species   History of Clostridium difficile colitis   Discharge Instructions  Discharge Instructions    Call MD for:  difficulty breathing, headache or visual disturbances    Complete by:  As directed    Call MD for:  extreme fatigue    Complete by:  As directed    Call MD for:  hives    Complete by:  As directed    Call MD for:  persistant dizziness or light-headedness    Complete by:  As directed    Call MD for:  persistant nausea and vomiting    Complete by:  As directed    Call MD for:  severe uncontrolled pain    Complete by:  As directed    Call MD for:  temperature >100.4    Complete by:  As directed    Diet - low sodium heart healthy    Complete by:  As directed    Diet  Carb Modified    Complete by:  As directed    Discharge instructions    Complete by:  As directed    Please follow up with your PCP in one week. Please check thyroid function test in 4-6 weeks.   Increase activity slowly    Complete by:  As directed      Allergies as of 04/16/2017      Reactions   Codeine Nausea Only      Medication List    STOP taking these medications   ciprofloxacin 500 MG tablet Commonly known as:  CIPRO   hydrOXYzine 25 MG tablet Commonly known as:  ATARAX/VISTARIL     TAKE these medications   acetaminophen 500 MG tablet Commonly known as:  TYLENOL Take 1,000 mg by mouth every 6 (six) hours as needed.   baclofen 10 MG tablet Commonly known as:  LIORESAL Take 5 mg by mouth 3 (three) times daily as needed (for hiccups).   BD PEN NEEDLE NANO U/F 32G X 4 MM Misc Generic drug:  Insulin Pen Needle USE TO INJECT INSULIN TWICE A DAY   ibuprofen 200 MG tablet Commonly known as:  ADVIL,MOTRIN Take 400 mg by mouth every 6 (six) hours as needed.   Insulin Detemir 100 UNIT/ML Pen Commonly known as:  LEVEMIR FLEXTOUCH Inject 25 Units into the skin daily. What changed:  how much to take   levothyroxine 25 MCG tablet Commonly known as:  SYNTHROID, LEVOTHROID Take 1 tablet (25 mcg total) by mouth daily before breakfast. Start taking on:  04/17/2017   lidocaine-prilocaine cream Commonly known as:  EMLA Apply 1 application topically as needed (prior to accessing port).   lipase/protease/amylase 36000 UNITS Cpep capsule Commonly known as:  CREON Take 1 capsule (36,000 Units total) by mouth 3 (three) times daily before meals.   loratadine 10 MG tablet Commonly known as:  CLARITIN Take 10 mg by mouth daily.   multivitamin with minerals Tabs tablet Take 1 tablet by mouth daily.   NOVOLOG FLEXPEN 100 UNIT/ML FlexPen Generic drug:  insulin aspart Inject 4 Units into the skin daily before supper.   ondansetron 8 MG tablet Commonly known as:   ZOFRAN Take 1 tablet (8 mg total) by mouth every 8 (eight) hours as needed for nausea or vomiting.   polyethylene glycol packet Commonly known as:  MIRALAX / GLYCOLAX Take 17 g by mouth daily as needed for mild constipation.   prochlorperazine 10 MG tablet Commonly known as:  COMPAZINE Take 1 tablet (10 mg total) by mouth every 6 (six) hours as needed for nausea or vomiting.   sulfamethoxazole-trimethoprim 800-160 MG tablet Commonly known as:  BACTRIM DS,SEPTRA DS Take 1 tablet by mouth 2 (two) times daily.   traMADol 50 MG tablet Commonly known as:  ULTRAM Take 1 tablet (50 mg total) by mouth every 6 (six) hours as needed.   vancomycin 50 mg/mL oral solution Commonly known as:  VANCOCIN Take 2.5 mLs (125 mg total) by mouth every 6 (six) hours.      Follow-up Information    [name], MD. Schedule an appointment as soon as possible for a visit in 1 week(s).   Specialty:  Family Medicine Contact  information: [address] [place] [address] [phone]        Carol Ada, MD. Schedule an appointment as soon as possible for a visit in 2 week(s).   Specialty:  Gastroenterology Contact information: [place], [place] [address] [phone]          Allergies  Allergen Reactions  . Codeine Nausea Only    Consultations: ID GI  Procedures/Studies: none  Subjective: Patient was seen and examined at bedside. Reported doing well and eager to go home today. Denied headache, dizziness, nausea, vomiting, chest pain, shortness of breath, abdominal pain. Able to tolerate diet well. Able to ambulate without difficulties.  Discharge Exam: Vitals:   04/15/17 2012 04/16/17 0448  BP: 127/77 120/85  Pulse: 76 72  Resp: 18 18  Temp: 99.4 F (37.4 C) 98 F (36.7 C)   Vitals:   04/15/17 1822 04/15/17 1910 04/15/17 2012 04/16/17 0448  BP: (!) 137/91 125/88 127/77 120/85  Pulse: 81 93 76 72  Resp: 17 18 18 18   Temp: 98.7  F (37.1 C) 98.7 F (37.1 C) 99.4 F (37.4 C) 98 F (36.7 C)  TempSrc: Axillary Axillary Oral Oral  SpO2: 99% 99% 99% 100%  Weight:      Height:        General: Pt is alert, awake, not in acute distress Cardiovascular: RRR, S1/S2 +, no rubs, no gallops Respiratory: CTA bilaterally, no wheezing, no rhonchi Abdominal: Soft, NT, ND, bowel sounds +, Biliary drain site looks clean. Extremities: no edema, no cyanosis    The results of significant diagnostics from this hospitalization (including imaging, microbiology, ancillary and laboratory) are listed below for reference.     Microbiology: Recent Results (from the past 240 hour(s))  Blood culture (routine x 2)     Status: None (Preliminary result)   Collection Time: 04/12/17 11:25 AM  Result Value Ref Range Status   Specimen Description BLOOD RIGHT FOREARM  Final   Special Requests   Final    BOTTLES DRAWN AEROBIC AND ANAEROBIC Blood Culture adequate volume   Culture   Final    NO GROWTH 3 DAYS Performed at [facility], [address] [place], [place] [address]    Report Status PENDING  Incomplete  Blood culture (routine x 2)     Status: Abnormal   Collection Time: 04/12/17 11:26 AM  Result Value Ref Range Status   Specimen Description BLOOD RIGHT HAND  Final   Special Requests   Final    BOTTLES DRAWN AEROBIC AND ANAEROBIC Blood Culture adequate volume   Culture  Setup Time   Final    GRAM NEGATIVE RODS IN BOTH AEROBIC AND ANAEROBIC BOTTLES CRITICAL RESULT CALLED TO, READ BACK BY AND VERIFIED WITH: Lavell Luster PHARMD 0121 04/13/17 A BROWNING    Culture (A)  Final    KLEBSIELLA PNEUMONIAE ENTEROBACTER CLOACAE DETECTED ON BCID BUT NOT RECOVERED IN CULTURE RESULT CALLED TO, READ BACK BY AND VERIFIED WITH: DR CAMPBELL AT 1525 04/15/17 BY L BENFIELD Performed at [facility], [place] [address], [place], [place] [address]    Report Status 04/15/2017 FINAL  Final   Organism ID, Bacteria KLEBSIELLA PNEUMONIAE  Final       Susceptibility   Klebsiella pneumoniae - MIC*    AMPICILLIN >=32 RESISTANT Resistant     CEFAZOLIN <=4 SENSITIVE Sensitive     CEFEPIME <=1 SENSITIVE Sensitive     CEFTAZIDIME <=1 SENSITIVE Sensitive     CEFTRIAXONE <=  1 SENSITIVE Sensitive     CIPROFLOXACIN <=0.25 SENSITIVE Sensitive     GENTAMICIN <=1 SENSITIVE Sensitive     IMIPENEM <=0.25 SENSITIVE Sensitive     TRIMETH/SULFA <=20 SENSITIVE Sensitive     AMPICILLIN/SULBACTAM 4 SENSITIVE Sensitive     PIP/TAZO <=4 SENSITIVE Sensitive     Extended ESBL NEGATIVE Sensitive     * KLEBSIELLA PNEUMONIAE  Blood Culture ID Panel (Reflexed)     Status: Abnormal   Collection Time: 04/12/17 11:26 AM  Result Value Ref Range Status   Enterococcus species NOT DETECTED NOT DETECTED Final   Listeria monocytogenes NOT DETECTED NOT DETECTED Final   Staphylococcus species NOT DETECTED NOT DETECTED Final   Staphylococcus aureus NOT DETECTED NOT DETECTED Final   Streptococcus species NOT DETECTED NOT DETECTED Final   Streptococcus agalactiae NOT DETECTED NOT DETECTED Final   Streptococcus pneumoniae NOT DETECTED NOT DETECTED Final   Streptococcus pyogenes NOT DETECTED NOT DETECTED Final   Acinetobacter baumannii NOT DETECTED NOT DETECTED Final   Enterobacteriaceae species DETECTED (A) NOT DETECTED Final    Comment: CRITICAL RESULT CALLED TO, READ BACK BY AND VERIFIED WITH: J GRIMSLEY PHARMD 0121 04/13/17 A BROWNING    Enterobacter cloacae complex DETECTED (A) NOT DETECTED Final    Comment: CRITICAL RESULT CALLED TO, READ BACK BY AND VERIFIED WITH: J GRIMSLEY PHARMD 0121 04/13/17 A BROWNING    Escherichia coli NOT DETECTED NOT DETECTED Final   Klebsiella oxytoca NOT DETECTED NOT DETECTED Final   Klebsiella pneumoniae DETECTED (A) NOT DETECTED Final    Comment: CRITICAL RESULT CALLED TO, READ BACK BY AND VERIFIED WITH: Lavell Luster PHARMD 0121 04/13/17 A BROWNING    Proteus species NOT DETECTED NOT DETECTED Final   Serratia marcescens NOT  DETECTED NOT DETECTED Final   Carbapenem resistance NOT DETECTED NOT DETECTED Final   Haemophilus influenzae NOT DETECTED NOT DETECTED Final   Neisseria meningitidis NOT DETECTED NOT DETECTED Final   Pseudomonas aeruginosa NOT DETECTED NOT DETECTED Final   Candida albicans NOT DETECTED NOT DETECTED Final   Candida glabrata NOT DETECTED NOT DETECTED Final   Candida krusei NOT DETECTED NOT DETECTED Final   Candida parapsilosis NOT DETECTED NOT DETECTED Final   Candida tropicalis NOT DETECTED NOT DETECTED Final    Comment: Performed at [facility], [address], [place], [place] [address]  Urine Culture     Status: None   Collection Time: 04/12/17  4:09 PM  Result Value Ref Range Status   Specimen Description URINE, CLEAN CATCH  Final   Special Requests NONE  Final   Culture   Final    NO GROWTH Performed at [facility], [place] [address] [place], [place] [address]    Report Status 04/14/2017 FINAL  Final     Labs: BNP (last 3 results) No results for input(s): BNP in the last 8760 hours. Basic Metabolic Panel:  Recent Labs Lab 04/12/17 0950 04/13/17 0335 04/14/17 0102 04/15/17 1206  NA 131* 135 134* 133*  K 3.7 3.9 3.5 4.3  CL 98* 104 103 102  CO2 26 24 23 24   GLUCOSE 194* 238* 110* 163*  BUN 14 11 18 15   CREATININE 0.74 0.76 0.71 0.66  CALCIUM 8.0* 7.7* 8.0* 8.0*   Liver Function Tests:  Recent Labs Lab 04/12/17 1526 04/14/17 0102 04/15/17 1206  AST 71* 60* 58*  ALT 61 53 49  ALKPHOS 937* 736* 824*  BILITOT 2.5* 2.5* 1.5*  PROT 5.6* 5.2* 5.7*  ALBUMIN 2.5* 2.2* 2.3*  No results for input(s): LIPASE, AMYLASE in the last 168 hours. No results for input(s): AMMONIA in the last 168 hours. CBC:  Recent Labs Lab 04/12/17 0950 04/13/17 0335 04/14/17 0102 04/15/17 0424 04/16/17 0414  WBC 8.1 8.6 8.4 4.7 4.9  NEUTROABS 7.6  --   --   --   --   HGB 7.3* 7.0* 9.1* 8.8* 9.2*  HCT 21.7* 21.0* 26.3* 25.6* 27.1*  MCV 90.0 92.1 88.0 88.0 88.9  PLT  123* 120* 115* 93* 113*   Cardiac Enzymes: No results for input(s): CKTOTAL, CKMB, CKMBINDEX, TROPONINI in the last 168 hours. BNP: Invalid input(s): POCBNP CBG:  Recent Labs Lab 04/15/17 1207 04/15/17 1817 04/15/17 2052 04/16/17 0039 04/16/17 0249  GLUCAP 144* 128* 176* 50* 80   D-Dimer No results for input(s): DDIMER in the last 72 hours. Hgb A1c No results for input(s): HGBA1C in the last 72 hours. Lipid Profile No results for input(s): CHOL, HDL, LDLCALC, TRIG, CHOLHDL, LDLDIRECT in the last 72 hours. Thyroid function studies No results for input(s): TSH, T4TOTAL, T3FREE, THYROIDAB in the last 72 hours.  Invalid input(s): FREET3 Anemia work up No results for input(s): VITAMINB12, FOLATE, FERRITIN, TIBC, IRON, RETICCTPCT in the last 72 hours. Urinalysis    Component Value Date/Time   COLORURINE YELLOW 04/12/2017 1609   APPEARANCEUR CLEAR 04/12/2017 1609   LABSPEC 1.019 04/12/2017 1609   PHURINE 5.0 04/12/2017 1609   GLUCOSEU 50 (A) 04/12/2017 1609   HGBUR SMALL (A) 04/12/2017 1609   BILIRUBINUR NEGATIVE 04/12/2017 1609   KETONESUR NEGATIVE 04/12/2017 1609   PROTEINUR 30 (A) 04/12/2017 1609   NITRITE NEGATIVE 04/12/2017 1609   LEUKOCYTESUR NEGATIVE 04/12/2017 1609   Sepsis Labs Invalid input(s): PROCALCITONIN,  WBC,  LACTICIDVEN Microbiology Recent Results (from the past 240 hour(s))  Blood culture (routine x 2)     Status: None (Preliminary result)   Collection Time: 04/12/17 11:25 AM  Result Value Ref Range Status   Specimen Description BLOOD RIGHT FOREARM  Final   Special Requests   Final    BOTTLES DRAWN AEROBIC AND ANAEROBIC Blood Culture adequate volume   Culture   Final    NO GROWTH 3 DAYS Performed at [facility], [place] [address] [place], [place] [address]    Report Status PENDING  Incomplete  Blood culture (routine x 2)     Status: Abnormal   Collection Time: 04/12/17 11:26 AM  Result Value Ref Range Status   Specimen Description BLOOD  RIGHT HAND  Final   Special Requests   Final    BOTTLES DRAWN AEROBIC AND ANAEROBIC Blood Culture adequate volume   Culture  Setup Time   Final    GRAM NEGATIVE RODS IN BOTH AEROBIC AND ANAEROBIC BOTTLES CRITICAL RESULT CALLED TO, READ BACK BY AND VERIFIED WITH: Lavell Luster PHARMD 0121 04/13/17 A BROWNING    Culture (A)  Final    KLEBSIELLA PNEUMONIAE ENTEROBACTER CLOACAE DETECTED ON BCID BUT NOT RECOVERED IN CULTURE RESULT CALLED TO, READ BACK BY AND VERIFIED WITH: DR CAMPBELL AT 1525 04/15/17 BY L BENFIELD Performed at [facility], [place] [address], [place], [place] [address]    Report Status 04/15/2017 FINAL  Final   Organism ID, Bacteria KLEBSIELLA PNEUMONIAE  Final      Susceptibility   Klebsiella pneumoniae - MIC*    AMPICILLIN >=32 RESISTANT Resistant     CEFAZOLIN <=4 SENSITIVE Sensitive     CEFEPIME <=1 SENSITIVE Sensitive     CEFTAZIDIME <=1 SENSITIVE Sensitive  CEFTRIAXONE <=1 SENSITIVE Sensitive     CIPROFLOXACIN <=0.25 SENSITIVE Sensitive     GENTAMICIN <=1 SENSITIVE Sensitive     IMIPENEM <=0.25 SENSITIVE Sensitive     TRIMETH/SULFA <=20 SENSITIVE Sensitive     AMPICILLIN/SULBACTAM 4 SENSITIVE Sensitive     PIP/TAZO <=4 SENSITIVE Sensitive     Extended ESBL NEGATIVE Sensitive     * KLEBSIELLA PNEUMONIAE  Blood Culture ID Panel (Reflexed)     Status: Abnormal   Collection Time: 04/12/17 11:26 AM  Result Value Ref Range Status   Enterococcus species NOT DETECTED NOT DETECTED Final   Listeria monocytogenes NOT DETECTED NOT DETECTED Final   Staphylococcus species NOT DETECTED NOT DETECTED Final   Staphylococcus aureus NOT DETECTED NOT DETECTED Final   Streptococcus species NOT DETECTED NOT DETECTED Final   Streptococcus agalactiae NOT DETECTED NOT DETECTED Final   Streptococcus pneumoniae NOT DETECTED NOT DETECTED Final   Streptococcus pyogenes NOT DETECTED NOT DETECTED Final   Acinetobacter baumannii NOT DETECTED NOT DETECTED Final   Enterobacteriaceae  species DETECTED (A) NOT DETECTED Final    Comment: CRITICAL RESULT CALLED TO, READ BACK BY AND VERIFIED WITH: J GRIMSLEY PHARMD 0121 04/13/17 A BROWNING    Enterobacter cloacae complex DETECTED (A) NOT DETECTED Final    Comment: CRITICAL RESULT CALLED TO, READ BACK BY AND VERIFIED WITH: J GRIMSLEY PHARMD 0121 04/13/17 A BROWNING    Escherichia coli NOT DETECTED NOT DETECTED Final   Klebsiella oxytoca NOT DETECTED NOT DETECTED Final   Klebsiella pneumoniae DETECTED (A) NOT DETECTED Final    Comment: CRITICAL RESULT CALLED TO, READ BACK BY AND VERIFIED WITH: Lavell Luster PHARMD 0121 04/13/17 A BROWNING    Proteus species NOT DETECTED NOT DETECTED Final   Serratia marcescens NOT DETECTED NOT DETECTED Final   Carbapenem resistance NOT DETECTED NOT DETECTED Final   Haemophilus influenzae NOT DETECTED NOT DETECTED Final   Neisseria meningitidis NOT DETECTED NOT DETECTED Final   Pseudomonas aeruginosa NOT DETECTED NOT DETECTED Final   Candida albicans NOT DETECTED NOT DETECTED Final   Candida glabrata NOT DETECTED NOT DETECTED Final   Candida krusei NOT DETECTED NOT DETECTED Final   Candida parapsilosis NOT DETECTED NOT DETECTED Final   Candida tropicalis NOT DETECTED NOT DETECTED Final    Comment: Performed at [facility], [address], [place], [place] [address]  Urine Culture     Status: None   Collection Time: 04/12/17  4:09 PM  Result Value Ref Range Status   Specimen Description URINE, CLEAN CATCH  Final   Special Requests NONE  Final   Culture   Final    NO GROWTH Performed at [facility], [place] [address] [place], [place] [address]    Report Status 04/14/2017 FINAL  Final     Time coordinating discharge: 33 minutes  SIGNED:   Rosita Fire, MD  [facility] 04/16/2017, 10:20 AM  If 7PM-7AM, please contact night-coverage [email or web address] Password [email or web address]

## 2017-04-16 NOTE — Progress Notes (Signed)
Patient d/c home. Stable. 

## 2017-04-16 NOTE — Care Management Important Message (Signed)
Important Message  Patient Details  Name: TREJAN BUDA MRN: [id number] Date of Birth: Dec 18, 1945   Medicare Important Message Given:  Yes    Kerin Salen 04/16/2017, 11:06 AMImportant Message  Patient Details  Name: WILLET SCHLEIFER MRN: [id number] Date of Birth: [birth year]   Medicare Important Message Given:  Yes    Kerin Salen 04/16/2017, 11:05 AM

## 2017-04-16 NOTE — Progress Notes (Signed)
Discharge instructions given to wife,verbalized understanding.multiple prescriptions given. All questions answered appropriately.

## 2017-04-17 ENCOUNTER — Telehealth: Payer: Self-pay | Admitting: Family Medicine

## 2017-04-17 NOTE — Telephone Encounter (Signed)
Pt was D/C 'd from hospital on 04/16/17 - Spoke to wife and pt is doing good with only one question about why they put him back on the levothyroxine when Dr. [name] took him off of it over a year ago for chemo. I informed her to take to Dr. [name] on Monday as he has an appt and see what he recommends. Appointment for Hospital f/u made with Dr. Dennard Schaumann.

## 2017-04-18 LAB — CULTURE, BLOOD (ROUTINE X 2): Special Requests: ADEQUATE

## 2017-04-19 ENCOUNTER — Ambulatory Visit (HOSPITAL_COMMUNITY): Payer: Medicare Other

## 2017-04-19 ENCOUNTER — Ambulatory Visit (HOSPITAL_BASED_OUTPATIENT_CLINIC_OR_DEPARTMENT_OTHER): Payer: Medicare Other

## 2017-04-19 ENCOUNTER — Other Ambulatory Visit (HOSPITAL_BASED_OUTPATIENT_CLINIC_OR_DEPARTMENT_OTHER): Payer: Medicare Other

## 2017-04-19 DIAGNOSIS — Z452 Encounter for adjustment and management of vascular access device: Secondary | ICD-10-CM | POA: Diagnosis not present

## 2017-04-19 DIAGNOSIS — Z95828 Presence of other vascular implants and grafts: Secondary | ICD-10-CM

## 2017-04-19 DIAGNOSIS — C250 Malignant neoplasm of head of pancreas: Secondary | ICD-10-CM

## 2017-04-19 LAB — COMPREHENSIVE METABOLIC PANEL
ALBUMIN: 2.4 g/dL — AB (ref 3.5–5.0)
ALK PHOS: 843 U/L — AB (ref 40–150)
ALT: 32 U/L (ref 0–55)
AST: 29 U/L (ref 5–34)
Anion Gap: 8 mEq/L (ref 3–11)
BILIRUBIN TOTAL: 1.5 mg/dL — AB (ref 0.20–1.20)
BUN: 12.7 mg/dL (ref 7.0–26.0)
CO2: 25 meq/L (ref 22–29)
CREATININE: 0.8 mg/dL (ref 0.7–1.3)
Calcium: 8.9 mg/dL (ref 8.4–10.4)
Chloride: 98 mEq/L (ref 98–109)
EGFR: 90 mL/min/{1.73_m2} — AB (ref >90)
GLUCOSE: 266 mg/dL — AB (ref 70–140)
Potassium: 4.7 mEq/L (ref 3.5–5.1)
SODIUM: 131 meq/L — AB (ref 136–145)
TOTAL PROTEIN: 6.3 g/dL — AB (ref 6.4–8.3)

## 2017-04-19 LAB — CBC WITH DIFFERENTIAL/PLATELET
BASO%: 0.5 % (ref 0.0–2.0)
Basophils Absolute: 0 10*3/uL (ref 0.0–0.1)
EOS ABS: 0.1 10*3/uL (ref 0.0–0.5)
EOS%: 2.4 % (ref 0.0–7.0)
HCT: 29.2 % — ABNORMAL LOW (ref 38.4–49.9)
HEMOGLOBIN: 9.4 g/dL — AB (ref 13.0–17.1)
LYMPH%: 13.9 % — AB (ref 14.0–49.0)
MCH: 29.9 pg (ref 27.2–33.4)
MCHC: 32.2 g/dL (ref 32.0–36.0)
MCV: 93 fL (ref 79.3–98.0)
MONO#: 0.8 10*3/uL (ref 0.1–0.9)
MONO%: 13.1 % (ref 0.0–14.0)
NEUT%: 70.1 % (ref 39.0–75.0)
NEUTROS ABS: 4.1 10*3/uL (ref 1.5–6.5)
Platelets: 180 10*3/uL (ref 140–400)
RBC: 3.14 10*6/uL — AB (ref 4.20–5.82)
RDW: 17.9 % — AB (ref 11.0–14.6)
WBC: 5.8 10*3/uL (ref 4.0–10.3)
lymph#: 0.8 10*3/uL — ABNORMAL LOW (ref 0.9–3.3)

## 2017-04-19 MED ORDER — SODIUM CHLORIDE 0.9 % IJ SOLN
10.0000 mL | INTRAMUSCULAR | Status: DC | PRN
  Administered 2017-04-19: 10 mL via INTRAVENOUS
  Filled 2017-04-19: qty 10

## 2017-04-19 NOTE — Patient Instructions (Signed)
Implanted Port Home Guide An implanted port is a type of central line that is placed under the skin. Central lines are used to provide IV access when treatment or nutrition needs to be given through a person's veins. Implanted ports are used for long-term IV access. An implanted port may be placed because:  You need IV medicine that would be irritating to the small veins in your hands or arms.  You need long-term IV medicines, such as antibiotics.  You need IV nutrition for a long period.  You need frequent blood draws for lab tests.  You need dialysis.  Implanted ports are usually placed in the chest area, but they can also be placed in the upper arm, the abdomen, or the leg. An implanted port has two main parts:  Reservoir. The reservoir is round and will appear as a small, raised area under your skin. The reservoir is the part where a needle is inserted to give medicines or draw blood.  Catheter. The catheter is a thin, flexible tube that extends from the reservoir. The catheter is placed into a large vein. Medicine that is inserted into the reservoir goes into the catheter and then into the vein.  How will I care for my incision site? Do not get the incision site wet. Bathe or shower as directed by your health care provider. How is my port accessed? Special steps must be taken to access the port:  Before the port is accessed, a numbing cream can be placed on the skin. This helps numb the skin over the port site.  Your health care provider uses a sterile technique to access the port. ? Your health care provider must put on a mask and sterile gloves. ? The skin over your port is cleaned carefully with an antiseptic and allowed to dry. ? The port is gently pinched between sterile gloves, and a needle is inserted into the port.  Only "non-coring" port needles should be used to access the port. Once the port is accessed, a blood return should be checked. This helps ensure that the port  is in the vein and is not clogged.  If your port needs to remain accessed for a constant infusion, a clear (transparent) bandage will be placed over the needle site. The bandage and needle will need to be changed every week, or as directed by your health care provider.  Keep the bandage covering the needle clean and dry. Do not get it wet. Follow your health care provider's instructions on how to take a shower or bath while the port is accessed.  If your port does not need to stay accessed, no bandage is needed over the port.  What is flushing? Flushing helps keep the port from getting clogged. Follow your health care provider's instructions on how and when to flush the port. Ports are usually flushed with saline solution or a medicine called heparin. The need for flushing will depend on how the port is used.  If the port is used for intermittent medicines or blood draws, the port will need to be flushed: ? After medicines have been given. ? After blood has been drawn. ? As part of routine maintenance.  If a constant infusion is running, the port may not need to be flushed.  How long will my port stay implanted? The port can stay in for as long as your health care provider thinks it is needed. When it is time for the port to come out, surgery will be   done to remove it. The procedure is similar to the one performed when the port was put in. When should I seek immediate medical care? When you have an implanted port, you should seek immediate medical care if:  You notice a bad smell coming from the incision site.  You have swelling, redness, or drainage at the incision site.  You have more swelling or pain at the port site or the surrounding area.  You have a fever that is not controlled with medicine.  This information is not intended to replace advice given to you by your health care provider. Make sure you discuss any questions you have with your health care provider. Document  Released: 10/08/2005 Document Revised: 03/15/2016 Document Reviewed: 06/15/2013 Elsevier Interactive Patient Education  2017 [employer] Inc[employer]  

## 2017-04-20 LAB — CANCER ANTIGEN 19-9: CA 19-9: 2375 U/mL — ABNORMAL HIGH (ref 0–35)

## 2017-04-22 ENCOUNTER — Ambulatory Visit (HOSPITAL_BASED_OUTPATIENT_CLINIC_OR_DEPARTMENT_OTHER): Payer: Medicare Other | Admitting: Oncology

## 2017-04-22 ENCOUNTER — Ambulatory Visit: Payer: Medicare Other

## 2017-04-22 ENCOUNTER — Other Ambulatory Visit: Payer: Self-pay | Admitting: Family Medicine

## 2017-04-22 ENCOUNTER — Other Ambulatory Visit: Payer: Self-pay

## 2017-04-22 ENCOUNTER — Telehealth: Payer: Self-pay | Admitting: Oncology

## 2017-04-22 VITALS — BP 133/78 | HR 67 | Temp 98.0°F | Resp 20 | Ht 72.0 in | Wt 146.5 lb

## 2017-04-22 DIAGNOSIS — C250 Malignant neoplasm of head of pancreas: Secondary | ICD-10-CM

## 2017-04-22 DIAGNOSIS — G620 Drug-induced polyneuropathy: Secondary | ICD-10-CM

## 2017-04-22 DIAGNOSIS — C787 Secondary malignant neoplasm of liver and intrahepatic bile duct: Secondary | ICD-10-CM | POA: Diagnosis not present

## 2017-04-22 DIAGNOSIS — R7881 Bacteremia: Secondary | ICD-10-CM | POA: Diagnosis not present

## 2017-04-22 DIAGNOSIS — R740 Nonspecific elevation of levels of transaminase and lactic acid dehydrogenase [LDH]: Secondary | ICD-10-CM | POA: Diagnosis not present

## 2017-04-22 DIAGNOSIS — B961 Klebsiella pneumoniae [K. pneumoniae] as the cause of diseases classified elsewhere: Secondary | ICD-10-CM

## 2017-04-22 DIAGNOSIS — E119 Type 2 diabetes mellitus without complications: Secondary | ICD-10-CM | POA: Diagnosis not present

## 2017-04-22 DIAGNOSIS — Z7189 Other specified counseling: Secondary | ICD-10-CM | POA: Insufficient documentation

## 2017-04-22 MED ORDER — LIDOCAINE-PRILOCAINE 2.5-2.5 % EX CREA: 1.0000 "application " | TOPICAL_CREAM | CUTANEOUS | 3 refills | Status: AC | PRN

## 2017-04-22 NOTE — Progress Notes (Signed)
START ON PATHWAY REGIMEN - Pancreatic     A cycle is every 28 days:     Nab-paclitaxel (protein bound)      Gemcitabine   **Always confirm dose/schedule in your pharmacy ordering system**    Patient Characteristics: Adenocarcinoma, Metastatic Disease, Second Line, MSS/pMMR or MSI Unknown, If FOLFIRINOX First Line Histology: Adenocarcinoma Current evidence of distant metastases? Yes AJCC T Category: Staged < 8th Ed. AJCC N Category: Staged < 8th Ed. AJCC M Category: Staged < 8th Ed. AJCC 8 Stage Grouping: Staged < 8th Ed. Line of therapy: Second Line Would you be surprised if this patient died  in the next year? I would NOT be surprised if this patient died in the next year Microsatellite/Mismatch Repair Status: Unknown Intent of Therapy: Non-Curative / Palliative Intent, Discussed with Patient

## 2017-04-22 NOTE — Telephone Encounter (Signed)
Scheduled appt per 7/2 los - Gave patient AVS and calender per los.  

## 2017-04-22 NOTE — Progress Notes (Signed)
[place] OFFICE PROGRESS NOTE   Diagnosis: Pancreas cancer  INTERVAL HISTORY:   Jacob Hardin returns as scheduled. He was admitted with Klebsiella bacteremia on 04/12/2017. This was after placement of a Gastroduodenal and bile duct stent on 04/10/2017. He completed a course of IV antibiotics and will finish Bactrim today. He was treated with prophylactic oral vancomycin. He underwent exchange of the internal/external biliary drain on 04/15/2017.  He reports feeling well. No recurrent fever.  Objective:  Vital signs in last 24 hours:  Blood pressure 133/78, pulse 67, temperature 98 F (36.7 C), temperature source Oral, resp. rate 20, height 6' (1.829 m), weight 146 lb 8 oz (66.5 kg), SpO2 100 %.    HEENT: No thrush, small healing ulcers at the left buccal mucosa Resp: Lungs clear bilaterally Cardio: Regular rate and rhythm GI: No hepatomegaly, no mass, nontender, right upper quadrant biliary drain site with a gauze dressing Vascular: No leg edema   Portacath/PICC-without erythema  Lab Results:  Lab Results  Component Value Date   WBC 5.8 04/19/2017   HGB 9.4 (L) 04/19/2017   HCT 29.2 (L) 04/19/2017   MCV 93.0 04/19/2017   PLT 180 04/19/2017   NEUTROABS 4.1 04/19/2017    CMP     Component Value Date/Time   NA 131 (L) 04/19/2017 1021   K 4.7 04/19/2017 1021   CL 102 04/15/2017 1206   CO2 25 04/19/2017 1021   GLUCOSE 266 (H) 04/19/2017 1021   BUN 12.7 04/19/2017 1021   CREATININE 0.8 04/19/2017 1021   CALCIUM 8.9 04/19/2017 1021   PROT 6.3 (L) 04/19/2017 1021   ALBUMIN 2.4 (L) 04/19/2017 1021   AST 29 04/19/2017 1021   ALT 32 04/19/2017 1021   ALKPHOS 843 (H) 04/19/2017 1021   BILITOT 1.50 (H) 04/19/2017 1021   GFRNONAA >60 04/15/2017 1206   GFRNONAA 79 11/03/2015 0839   GFRAA >60 04/15/2017 1206   GFRAA >89 11/03/2015 0839    Medications: I have reviewed the patient's current medications.  Assessment/Plan: 1.Pancreas cancer, stage IV,  pancreas head mass, status post an EUS biopsy 11/18/2015 confirming adenocarcinoma  Upper endoscopy 11/18/2015 confirmed duodenal invasion/obstruction with a biopsy confirming adenocarcinoma  Placement of a duodenal stent 11/22/2015  MRI abdomen 11/15/2015 revealed a pancreas head mass and liver metastases  Cycle 1 FOLFIRINOX 12/06/2015  Cycle 2 FOLFIRINOX 12/21/2015  Cycle 3 FOLFIRINOX 01/04/2016  CT abdomen/pelvis 01/19/2016 showed improvement in the pancreatic head mass and liver metastases.  Cycle 4 FOLFIRINOX 02/01/2016  Cycle 5 FOLFIRINOX 02/15/2016  Cycle 6 FOLFIRINOX 02/29/2016  Cycle 7 FOLFIRINOX 03/14/2016  Cycle 8 FOLFIRINOX 03/28/2016  CT abdomen/pelvis 04/09/2016-improvement in pancreas head mass and liver metastases, no new metastatic disease  Chemotherapy switch to FOLFIRI every 3 weeks beginning 04/11/2016  CT 07/30/2016-improvement in liver metastases, stable pancreas mass   FOLFIRI continued on a 3 week schedule  FOLFIRI 09/19/2016.  Treatment subsequently placed on hold due to biliary obstruction  CT 10/13/2016-no change in liver metastases or pancreas mass  FOLFIRI resumed on a 3 week schedule 01/07/2017  FOLFIRI 01/28/2017  FOLFIRI 02/26/2017  FOLFIRI 03/11/2017 (irinotecan dose reduced secondary to prolonged nausea)  FOLFIRI 04/01/2017  CT 04/12/2017-new and enlarging liver metastases, new soft tissue fullness at the pancreas tail  2. Diabetes  3. History of Anorexia/weight loss secondary to #1  4. Obstructive jaundice secondary to #1-status post placement of a percutaneous internal/external biliary drain 11/25/2015; biliary drainage catheter capped 12/02/2015; biliary drain internalized 12/13/2015  5. Port-A-Cath placement 11/30/2015 interventional  radiology  6. Admission 01/17/2016 with a high fever and rigors-no apparent source for infection upon review of his history and examination, blood cultures positive for  gram-positive cocci--viridans streptococcus--completed 2 weeks of IV ceftriaxone; no endocarditis on TTE.  7. Oxaliplatin neuropathy  8. Elevated transaminases, alkaline phosphatase, and bilirubin 06/13/2016.06/19/2016 bilirubin improved to normal range; liver enzymes remain elevated.  9. Generalized pruritus,no rash 10/10/2016.Likely secondary to hyperbilirubinemia. Resolved 11/12/2016. Recurrent 12/03/2016.Persistent pruritus.  10. Hospitalization 10/10/2016 through 10/20/2016 with biliary obstruction/sepsis. Status post placement of a biliary drain in interventional radiology 10/16/2016. Status post ERCP 10/26/2016 with biliary stent exchange. There was no bile drainage. Status post cholangiogram and exchange of biliary drain 11/07/2016.Biliary drain capped 11/12/2016.  Exchange of right sided percutaneous biliary drainage catheter 02/12/2017  Exchange of external biliary drain for internal/external biliary drain 03/20/2017  EGD 04/10/2017-gastric stenosis at the [facility], status post placement of a stent  New internal/external biliary drain 04/16/2017  11. 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 C. difficile colitis 04/26/2018status post course of vancomycin.  14. Delayed nausea following chemotherapy-irinotecan dose reduced and prophylactic Decadron added with chemotherapy 03/11/2017; no significant nausea following chemotherapy 03/11/2017.  15. Admission with Klebsiella bacteremia 04/12/2017   Disposition:  Jacob Hardin appears well. He was admitted on 04/12/2017 with Klebsiella bacteremia. A CT during the hospital admission revealed increased liver metastases compared to a CT from December. He had a significant gap between the December CT and resumption of FOLFIRI chemotherapy. However the overall pattern is of disease progression on FOLFIRI.  We discussed a treatment break,  gemcitabine/Abraxane, and repeat treatment with FOLFIRINOX. I recommend gemcitabine/Abraxane. He agrees. We reviewed the potential toxicities associated with these agents including the chance for hematologic toxicity, fever, rash, pneumonitis, and neuropathy.  He will be scheduled to start gemcitabine/Abraxane on a day one/day 8 schedule 05/06/2017.  30 minutes were spent with the patient today. The majority of the time was used for counseling and coordination of care.  Donneta Romberg, MD  04/22/2017  8:24 AM

## 2017-04-25 ENCOUNTER — Telehealth: Payer: Self-pay

## 2017-04-25 NOTE — Telephone Encounter (Signed)
Call placed to pt's wife at her request. Pt's wife states that an unnecessary CT has been scheduled on 7/16. This RN states that she will F/U with MD [name] and have scheduling make adjustments as necessary. Pt's wife verbalizes understanding and agrees with plan of care.

## 2017-04-26 ENCOUNTER — Inpatient Hospital Stay: Payer: Medicare Other | Admitting: Family Medicine

## 2017-04-26 ENCOUNTER — Ambulatory Visit (INDEPENDENT_AMBULATORY_CARE_PROVIDER_SITE_OTHER): Payer: Medicare Other | Admitting: Family Medicine

## 2017-04-26 ENCOUNTER — Encounter: Payer: Self-pay | Admitting: Family Medicine

## 2017-04-26 VITALS — BP 110/68 | HR 60 | Temp 97.4°F | Resp 14 | Ht 72.0 in | Wt 141.0 lb

## 2017-04-26 DIAGNOSIS — Z09 Encounter for follow-up examination after completed treatment for conditions other than malignant neoplasm: Secondary | ICD-10-CM | POA: Diagnosis not present

## 2017-04-26 NOTE — Progress Notes (Signed)
Subjective:    Patient ID: Jacob Hardin, male    DOB: 30-Aug-1946, 71 y.o.   MRN: [id number]  HPI  Patient is here today for hospital follow-up. Patient has a history of metastatic pancreatic cancer with liver metastasis as well as cancer involving the proximal portion of the duodenum requiring a duodenal stent. He was recently hospitalized for occlusion of the duodenal stent due to the malignancy and underwent endoscopic intervention to return patency to the duodenal stent. Postoperative course was complicated by Klebsiella bacteremia and sepsis. During the hospitalization for sepsis, he was found to have hyperglycemia as well as hypothyroidism. It was recommended that he follow with me to discuss these 2 issues. Regarding his hypothyroidism, he has been started on levothyroxine 25 g a day. TSH in the hospital is 24. He will be due to recheck his TSH in 6 weeks after initiation of the medication. Regarding his diabetes, he is currently using Levemir 35 units a day. He takes his insulin in the morning. His fasting blood sugars in the morning are typically 60-80. At night before bed, his sugars are typically 200-250. He takes any rapid acting insulin to correct his sugars at night, he will experience hypoglycemia between 2 and 4 in the morning. He understands that his metastatic pancreatic cancer is terminal. Therefore we have been very conservative in the management of his diabetes. My biggest concern would be hypoglycemia and complications due to this particularly with fluctuation in his diet due to chemotherapy, occlusion of the duodenal stent, and simply progression of his malignancy. Overall his sugars sound well controlled throughout the day and only could pose problems later in the evening. Past Medical History:  Diagnosis Date  . Elevated liver enzymes   . GERD (gastroesophageal reflux disease)   . Hyperlipidemia   . Hypertension    recently weight lost-no meds now-running low.  .  Obstructive jaundice   . Oxalate nephropathy   . PONV (postoperative nausea and vomiting)   . Primary pancreatic adenocarcinoma (HCC)    mets to liver and duodenum  . Pruritus   . Restless legs   . Skin cancer    "burned off my face & head"  . Type II diabetes mellitus ([place])   . Weight loss    Past Surgical History:  Procedure Laterality Date  . ANTERIOR CERVICAL DECOMP/DISCECTOMY FUSION  1994   cervical-bone graft  . BACK SURGERY    . COLONOSCOPY    . DUODENAL STENT PLACEMENT N/A 11/22/2015   Procedure: DUODENAL STENT PLACEMENT;  Surgeon: Carol Ada, MD;  Location: [facility];  Service: Endoscopy;  Laterality: N/A;  . DUODENAL STENT PLACEMENT N/A 04/10/2017   Procedure: DUODENAL STENT PLACEMENT;  Surgeon: Carol Ada, MD;  Location: [facility] ENDOSCOPY;  Service: Endoscopy;  Laterality: N/A;  . ERCP N/A 10/26/2016   Procedure: ENDOSCOPIC RETROGRADE CHOLANGIOPANCREATOGRAPHY (ERCP);  Surgeon: Carol Ada, MD;  Location: [facility] ENDOSCOPY;  Service: Endoscopy;  Laterality: N/A;  . ESOPHAGOGASTRODUODENOSCOPY N/A 11/18/2015   Procedure: ESOPHAGOGASTRODUODENOSCOPY (EGD);  Surgeon: Carol Ada, MD;  Location[facility] ENDOSCOPY;  Service: Endoscopy;  Laterality: N/A;  . ESOPHAGOGASTRODUODENOSCOPY N/A 11/22/2015   Procedure: ESOPHAGOGASTRODUODENOSCOPY (EGD);  Surgeon: Carol Ada, MD;  Location: [facility] ENDOSCOPY;  Service: Endoscopy;  Laterality: N/A;  Uncovered duodenal stent placement.  Fluoroscopy required.  . ESOPHAGOGASTRODUODENOSCOPY N/A 10/14/2016   Procedure: ESOPHAGOGASTRODUODENOSCOPY (EGD);  Surgeon: Carol Ada, MD;  Location: [facility];  Service: Endoscopy;  Laterality: N/A;  . ESOPHAGOGASTRODUODENOSCOPY (EGD) WITH PROPOFOL N/A 04/10/2017   Procedure: ESOPHAGOGASTRODUODENOSCOPY (EGD)  WITH PROPOFOL;  Surgeon: Carol Ada, MD;  Location: [facility] ENDOS[facility];  Service: Endoscopy;  Laterality: N/A;  . EUS  11/18/2015   Procedure: UPPER ENDOSCOPIC ULTRASOUND (EUS) LINEAR;  Surgeon: Carol Ada, MD;   Location: [facility];  Service: Endoscopy;;  . INGUINAL HERNIA REPAIR Left   . IR CHOLANGIOGRAM EXISTING TUBE  04/15/2017  . IR CONVERT BILIARY DRAIN TO INT EXT BILIARY DRAIN  03/20/2017  . IR EXCHANGE BILIARY DRAIN  02/12/2017  . IR EXCHANGE BILIARY DRAIN  03/12/2017  . IR GENERIC HISTORICAL  10/16/2016   IR BILIARY DRAIN PLACEMENT WITH CHOLANGIOGRAM 10/16/2016 Marybelle Killings, MD [facility]INTER[facility]  . IR GENERIC HISTORICAL  11/07/2016   IR EXCHANGE BILIARY DRAIN 11/07/2016 Arne Cleveland, MD [facility]  . IR GENERIC HISTORICAL  11/12/2016   IR PATIENT EVAL TECH 0-60 MINS 11/12/2016 Arne Cleveland, MD [facility]  . SHOULDER ARTHROSCOPY W/ ROTATOR CUFF REPAIR Left 2014  . TONSILLECTOMY    . WISDOM TOOTH EXTRACTION     Current Outpatient Prescriptions on File Prior to Visit  Medication Sig Dispense Refill  . acetaminophen (TYLENOL) 500 MG tablet Take 1,000 mg by mouth every 6 (six) hours as needed.    . baclofen (LIORESAL) 10 MG tablet Take 5 mg by mouth 3 (three) times daily as needed (for hiccups).    . BD PEN NEEDLE NANO U/F 32G X 4 MM MISC USE TO INJECT INSULIN TWICE A DAY 200 each 3  . ibuprofen (ADVIL,MOTRIN) 200 MG tablet Take 400 mg by mouth every 6 (six) hours as needed.    . insulin aspart (NOVOLOG FLEXPEN) 100 UNIT/ML FlexPen Inject 4 Units into the skin daily before supper.    . Insulin Detemir (LEVEMIR FLEXTOUCH) 100 UNIT/ML Pen Inject 25 Units into the skin daily. 15 mL 0  . levothyroxine (SYNTHROID, LEVOTHROID) 25 MCG tablet Take 1 tablet (25 mcg total) by mouth daily before breakfast. 30 tablet 0  . lidocaine-prilocaine (EMLA) cream Apply 1 application topically as needed (prior to accessing port). 30 g 3  . lipase/protease/amylase (CREON) 36000 UNITS CPEP capsule Take 1 capsule (36,000 Units total) by mouth 3 (three) times daily before meals. 90 capsule 1  . loratadine (CLARITIN) 10 MG tablet Take 10 mg by mouth daily.     . Multiple Vitamin (MULTIVITAMIN WITH MINERALS) TABS tablet  Take 1 tablet by mouth daily.    . ondansetron (ZOFRAN) 8 MG tablet Take 1 tablet (8 mg total) by mouth every 8 (eight) hours as needed for nausea or vomiting. 20 tablet 1  . ONE TOUCH ULTRA TEST test strip TEST TWICE A DAY 200 each 1  . polyethylene glycol (MIRALAX / GLYCOLAX) packet Take 17 g by mouth daily as needed for mild constipation.     . prochlorperazine (COMPAZINE) 10 MG tablet Take 1 tablet (10 mg total) by mouth every 6 (six) hours as needed for nausea or vomiting. 30 tablet 1  . traMADol (ULTRAM) 50 MG tablet Take 1 tablet (50 mg total) by mouth every 6 (six) hours as needed. 50 tablet 0  . vancomycin (VANCOCIN) 50 mg/mL oral solution Take 2.5 mLs (125 mg total) by mouth every 6 (six) hours. 140 mL 0   Current Facility-Administered Medications on File Prior to Visit  Medication Dose Route Frequency Provider Last Rate Last Dose  . sodium chloride 0.9 % injection 10 mL  10 mL Intravenous PRN Ladell Pier, MD   10 mL at 04/19/17 1032  . sodium chloride flush (NS) 0.9 %  injection 10 mL  10 mL Intravenous PRN Owens Shark, NP   10 mL at 01/04/16 0835  . sodium chloride flush (NS) 0.9 % injection 10 mL  10 mL Intravenous PRN Ladell Pier, MD   10 mL at 03/14/16 9702   Allergies  Allergen Reactions  . Codeine Nausea Only   Social History   Social History  . Marital status: Married    Spouse name: Butch Penny  . Number of children: 0  . Years of education: N/A   Occupational History  . retired     Pharmacist, hospital   Social History Main Topics  . Smoking status: Never Smoker  . Smokeless tobacco: Never Used  . Alcohol use No  . Drug use: No  . Sexual activity: Yes    Birth control/ protection: None     Comment: married to [place].  Retired Corporate investment banker   Other Topics Concern  . Not on file   Social History Narrative   Married, wife Butch Penny   No children   Retired Art therapist       Review of Systems  All other systems reviewed and are  negative.      Objective:   Physical Exam  Constitutional: He has a sickly appearance. No distress.  HENT:  Mouth/Throat: Oropharynx is clear and moist. No oropharyngeal exudate.  Eyes: Conjunctivae are normal.  Neck: No thyromegaly present.  Cardiovascular: Normal rate, regular rhythm and normal heart sounds.   Pulmonary/Chest: Effort normal and breath sounds normal. No respiratory distress. He has no wheezes. He has no rales.  Abdominal: Soft. Bowel sounds are normal.  Musculoskeletal: He exhibits no edema.  Lymphadenopathy:    He has no cervical adenopathy.  Skin: He is not diaphoretic.  Vitals reviewed.         Assessment & Plan:  Hospital discharge follow-up  Management of his metastatic pancreatic cancer is determined by his oncologist Dr. [name].  Per the patient's report, they're planning to change his chemotherapy given the recent CAT scan findings of progression of spots on his liver. Given this report, I would be very conservative in the management of diabetes. I feel that the long-term risk of hypoglycemia as far outweighed by the short-term risk of hypoglycemia. I believe the biggest issue is the fact that the Levemir is not providing a full 24-hour coverage. Therefore I recommended taking his Levemir and splitting it to a twice a day dosage. I've asked him to take 18 units in the morning and 10 units at dinnertime. I have asked him to gradually titrate up the dinnertime dose until he is on 18 units twice a day which is roughly the total cumulative dose he is taking now. However I believe that by spreading the insulin now, he will receive better control of his hyperglycemia in a 24-hour period with less chance of hypoglycemia. I've also asked the patient to recheck a TSH in 6 weeks. He believes that Dr. Learta Codding is doing this at his follow-up with oncologist. I will be glad to check this for him if he is unable to get this checked at the oncology office. Anything I can do to  help in his management I will be glad to do. The patient will follow up in 6 weeks either with meals with Dr. [name] to recheck his TSH.

## 2017-04-29 ENCOUNTER — Telehealth: Payer: Self-pay

## 2017-04-29 NOTE — Telephone Encounter (Signed)
Call placed to Central Scheduling to cancel CT on 7/16 per MD Sherrill. Pt has already had recent CT during hospital admission/

## 2017-05-03 ENCOUNTER — Other Ambulatory Visit: Payer: Self-pay | Admitting: Oncology

## 2017-05-06 ENCOUNTER — Telehealth: Payer: Self-pay | Admitting: Nurse Practitioner

## 2017-05-06 ENCOUNTER — Ambulatory Visit (HOSPITAL_COMMUNITY): Payer: Medicare Other

## 2017-05-06 ENCOUNTER — Other Ambulatory Visit (HOSPITAL_BASED_OUTPATIENT_CLINIC_OR_DEPARTMENT_OTHER): Payer: Medicare Other

## 2017-05-06 ENCOUNTER — Ambulatory Visit: Payer: Medicare Other | Admitting: Nutrition

## 2017-05-06 ENCOUNTER — Other Ambulatory Visit: Payer: Medicare Other

## 2017-05-06 ENCOUNTER — Ambulatory Visit: Payer: Medicare Other

## 2017-05-06 ENCOUNTER — Other Ambulatory Visit: Payer: Self-pay | Admitting: Nurse Practitioner

## 2017-05-06 ENCOUNTER — Ambulatory Visit (HOSPITAL_BASED_OUTPATIENT_CLINIC_OR_DEPARTMENT_OTHER): Payer: Medicare Other

## 2017-05-06 VITALS — BP 135/66 | HR 66 | Temp 98.4°F | Resp 18

## 2017-05-06 DIAGNOSIS — C259 Malignant neoplasm of pancreas, unspecified: Secondary | ICD-10-CM

## 2017-05-06 DIAGNOSIS — R509 Fever, unspecified: Secondary | ICD-10-CM

## 2017-05-06 DIAGNOSIS — C250 Malignant neoplasm of head of pancreas: Secondary | ICD-10-CM

## 2017-05-06 DIAGNOSIS — Z452 Encounter for adjustment and management of vascular access device: Secondary | ICD-10-CM | POA: Diagnosis not present

## 2017-05-06 DIAGNOSIS — Z95828 Presence of other vascular implants and grafts: Secondary | ICD-10-CM

## 2017-05-06 LAB — CBC WITH DIFFERENTIAL/PLATELET
BASO%: 0.6 % (ref 0.0–2.0)
Basophils Absolute: 0 10*3/uL (ref 0.0–0.1)
EOS%: 1.5 % (ref 0.0–7.0)
Eosinophils Absolute: 0.1 10*3/uL (ref 0.0–0.5)
HEMATOCRIT: 26.6 % — AB (ref 38.4–49.9)
HGB: 8.6 g/dL — ABNORMAL LOW (ref 13.0–17.1)
LYMPH%: 10.9 % — AB (ref 14.0–49.0)
MCH: 29.8 pg (ref 27.2–33.4)
MCHC: 32.3 g/dL (ref 32.0–36.0)
MCV: 92 fL (ref 79.3–98.0)
MONO#: 0.6 10*3/uL (ref 0.1–0.9)
MONO%: 11.3 % (ref 0.0–14.0)
NEUT%: 75.7 % — AB (ref 39.0–75.0)
NEUTROS ABS: 4.1 10*3/uL (ref 1.5–6.5)
PLATELETS: 110 10*3/uL — AB (ref 140–400)
RBC: 2.89 10*6/uL — AB (ref 4.20–5.82)
RDW: 16.8 % — ABNORMAL HIGH (ref 11.0–14.6)
WBC: 5.4 10*3/uL (ref 4.0–10.3)
lymph#: 0.6 10*3/uL — ABNORMAL LOW (ref 0.9–3.3)
nRBC: 0 % (ref 0–0)

## 2017-05-06 LAB — COMPREHENSIVE METABOLIC PANEL
ALBUMIN: 2.7 g/dL — AB (ref 3.5–5.0)
ALK PHOS: 496 U/L — AB (ref 40–150)
ALT: 59 U/L — AB (ref 0–55)
AST: 60 U/L — AB (ref 5–34)
Anion Gap: 10 mEq/L (ref 3–11)
BUN: 18.5 mg/dL (ref 7.0–26.0)
CALCIUM: 9 mg/dL (ref 8.4–10.4)
CO2: 25 mEq/L (ref 22–29)
CREATININE: 1 mg/dL (ref 0.7–1.3)
Chloride: 100 mEq/L (ref 98–109)
EGFR: 79 mL/min/{1.73_m2} — ABNORMAL LOW (ref >90)
GLUCOSE: 231 mg/dL — AB (ref 70–140)
Potassium: 3.6 mEq/L (ref 3.5–5.1)
SODIUM: 136 meq/L (ref 136–145)
TOTAL PROTEIN: 6.5 g/dL (ref 6.4–8.3)
Total Bilirubin: 3.5 mg/dL — ABNORMAL HIGH (ref 0.20–1.20)

## 2017-05-06 MED ORDER — SODIUM CHLORIDE 0.9 % IJ SOLN
10.0000 mL | INTRAMUSCULAR | Status: DC | PRN
  Administered 2017-05-06: 10 mL via INTRAVENOUS
  Filled 2017-05-06: qty 10

## 2017-05-06 MED ORDER — SODIUM CHLORIDE 0.9% FLUSH
10.0000 mL | INTRAVENOUS | Status: DC | PRN
  Administered 2017-05-06: 10 mL via INTRAVENOUS
  Filled 2017-05-06: qty 10

## 2017-05-06 MED ORDER — HEPARIN SOD (PORK) LOCK FLUSH 100 UNIT/ML IV SOLN
500.0000 [IU] | Freq: Once | INTRAVENOUS | Status: AC
Start: 2017-05-06 — End: 2017-05-06
  Administered 2017-05-06: 500 [IU] via INTRAVENOUS
  Filled 2017-05-06: qty 5

## 2017-05-06 MED ORDER — LEVOFLOXACIN 500 MG PO TABS: 500.0000 mg | ORAL_TABLET | Freq: Every day | ORAL | 0 refills | Status: DC

## 2017-05-06 NOTE — Progress Notes (Signed)
Nutrition follow-up completed with patient and his wife.  Weight has decreased and documented as 141 pounds. Patient reports his weight fluctuates and is down currently secondary to recent infection. Reports breakfast is his best meal.  He is drinking protein shakes, mostly in the evenings.  Nutrition diagnosis: Unintended weight loss continues.  Intervention: Provided support and encouragement for patient to continue strategies for increased calories and protein. Encouraged him to continue oral nutrition supplements. Teach back method used.  Monitoring, evaluation, goals: Patient will work to increase calories and protein to minimize further weight loss.  Next visit: To be scheduled as needed.  **Disclaimer: This note was dictated with voice recognition software. Similar sounding words can inadvertently be transcribed and this note may contain transcription errors which may not have been corrected upon publication of note.**

## 2017-05-06 NOTE — Telephone Encounter (Signed)
Scheduled appt per sch message from [place] - patient in the treatment area - per [place] will let Rn know to print out new schedule for the patient

## 2017-05-06 NOTE — Progress Notes (Signed)
Patient's wife reports that patient's temp was 102.7 on 05/05/17 evening.  05/06/17 Bilirubin was 3.50. [name] NP aware. Dr. Burr Medico and [name] NP at chairside, pt declines IVFs, pt educated to increase fluid (water) intake at home, pt to start antibiotic at home, no treatment today and cultures to be obtained ( one from pot, one from PIV) and pt to be seen Friday 05/10/17 for lab redraw and to see [name] NP per Dr. Burr Medico.  Pt and patient's wife aware. Pt verbalizes understanding. Appts schedule printed Pt stable at discharge.

## 2017-05-06 NOTE — Progress Notes (Signed)
Urine sample not obtained prior to pt leaving office. Called and talked to patients's spouse, per spouse they will have it done when they see Dr. [name] on Wed 05/08/17. Patient's spouse educated that if they are unable to collect it Weds to report to clinic to have it collected. Patient's spouse verbalizes understanding.

## 2017-05-06 NOTE — Patient Instructions (Signed)

## 2017-05-06 NOTE — Patient Instructions (Signed)
Implanted Port Home Guide An implanted port is a type of central line that is placed under the skin. Central lines are used to provide IV access when treatment or nutrition needs to be given through a person's veins. Implanted ports are used for long-term IV access. An implanted port may be placed because:  You need IV medicine that would be irritating to the small veins in your hands or arms.  You need long-term IV medicines, such as antibiotics.  You need IV nutrition for a long period.  You need frequent blood draws for lab tests.  You need dialysis.  Implanted ports are usually placed in the chest area, but they can also be placed in the upper arm, the abdomen, or the leg. An implanted port has two main parts:  Reservoir. The reservoir is round and will appear as a small, raised area under your skin. The reservoir is the part where a needle is inserted to give medicines or draw blood.  Catheter. The catheter is a thin, flexible tube that extends from the reservoir. The catheter is placed into a large vein. Medicine that is inserted into the reservoir goes into the catheter and then into the vein.  How will I care for my incision site? Do not get the incision site wet. Bathe or shower as directed by your health care provider. How is my port accessed? Special steps must be taken to access the port:  Before the port is accessed, a numbing cream can be placed on the skin. This helps numb the skin over the port site.  Your health care provider uses a sterile technique to access the port. ? Your health care provider must put on a mask and sterile gloves. ? The skin over your port is cleaned carefully with an antiseptic and allowed to dry. ? The port is gently pinched between sterile gloves, and a needle is inserted into the port.  Only "non-coring" port needles should be used to access the port. Once the port is accessed, a blood return should be checked. This helps ensure that the port  is in the vein and is not clogged.  If your port needs to remain accessed for a constant infusion, a clear (transparent) bandage will be placed over the needle site. The bandage and needle will need to be changed every week, or as directed by your health care provider.  Keep the bandage covering the needle clean and dry. Do not get it wet. Follow your health care provider's instructions on how to take a shower or bath while the port is accessed.  If your port does not need to stay accessed, no bandage is needed over the port.  What is flushing? Flushing helps keep the port from getting clogged. Follow your health care provider's instructions on how and when to flush the port. Ports are usually flushed with saline solution or a medicine called heparin. The need for flushing will depend on how the port is used.  If the port is used for intermittent medicines or blood draws, the port will need to be flushed: ? After medicines have been given. ? After blood has been drawn. ? As part of routine maintenance.  If a constant infusion is running, the port may not need to be flushed.  How long will my port stay implanted? The port can stay in for as long as your health care provider thinks it is needed. When it is time for the port to come out, surgery will be   done to remove it. The procedure is similar to the one performed when the port was put in. When should I seek immediate medical care? When you have an implanted port, you should seek immediate medical care if:  You notice a bad smell coming from the incision site.  You have swelling, redness, or drainage at the incision site.  You have more swelling or pain at the port site or the surrounding area.  You have a fever that is not controlled with medicine.  This information is not intended to replace advice given to you by your health care provider. Make sure you discuss any questions you have with your health care provider. Document  Released: 10/08/2005 Document Revised: 03/15/2016 Document Reviewed: 06/15/2013 Elsevier Interactive Patient Education  2017 [employer] Inc[employer]  

## 2017-05-07 LAB — CANCER ANTIGEN 19-9

## 2017-05-08 ENCOUNTER — Telehealth: Payer: Self-pay | Admitting: *Deleted

## 2017-05-08 ENCOUNTER — Inpatient Hospital Stay (HOSPITAL_COMMUNITY)
Admission: EM | Admit: 2017-05-08 | Discharge: 2017-05-12 | DRG: 919 | Disposition: A | Discharge status: Home / Self Care | Payer: Medicare Other | Attending: Internal Medicine | Admitting: Internal Medicine

## 2017-05-08 ENCOUNTER — Encounter: Payer: Self-pay | Admitting: *Deleted

## 2017-05-08 ENCOUNTER — Encounter (HOSPITAL_COMMUNITY): Payer: Self-pay | Admitting: Emergency Medicine

## 2017-05-08 DIAGNOSIS — D6481 Anemia due to antineoplastic chemotherapy: Secondary | ICD-10-CM | POA: Diagnosis present

## 2017-05-08 DIAGNOSIS — E876 Hypokalemia: Secondary | ICD-10-CM | POA: Diagnosis present

## 2017-05-08 DIAGNOSIS — K831 Obstruction of bile duct: Secondary | ICD-10-CM | POA: Diagnosis present

## 2017-05-08 DIAGNOSIS — Z66 Do not resuscitate: Secondary | ICD-10-CM | POA: Diagnosis present

## 2017-05-08 DIAGNOSIS — I10 Essential (primary) hypertension: Secondary | ICD-10-CM | POA: Diagnosis present

## 2017-05-08 DIAGNOSIS — C259 Malignant neoplasm of pancreas, unspecified: Secondary | ICD-10-CM | POA: Diagnosis present

## 2017-05-08 DIAGNOSIS — Z833 Family history of diabetes mellitus: Secondary | ICD-10-CM

## 2017-05-08 DIAGNOSIS — K830 Cholangitis: Secondary | ICD-10-CM | POA: Diagnosis not present

## 2017-05-08 DIAGNOSIS — Z8249 Family history of ischemic heart disease and other diseases of the circulatory system: Secondary | ICD-10-CM

## 2017-05-08 DIAGNOSIS — B961 Klebsiella pneumoniae [K. pneumoniae] as the cause of diseases classified elsewhere: Secondary | ICD-10-CM

## 2017-05-08 DIAGNOSIS — Z794 Long term (current) use of insulin: Secondary | ICD-10-CM | POA: Diagnosis not present

## 2017-05-08 DIAGNOSIS — T451X5A Adverse effect of antineoplastic and immunosuppressive drugs, initial encounter: Secondary | ICD-10-CM | POA: Diagnosis present

## 2017-05-08 DIAGNOSIS — K219 Gastro-esophageal reflux disease without esophagitis: Secondary | ICD-10-CM | POA: Diagnosis present

## 2017-05-08 DIAGNOSIS — Y848 Other medical procedures as the cause of abnormal reaction of the patient, or of later complication, without mention of misadventure at the time of the procedure: Secondary | ICD-10-CM | POA: Diagnosis present

## 2017-05-08 DIAGNOSIS — G2581 Restless legs syndrome: Secondary | ICD-10-CM | POA: Diagnosis present

## 2017-05-08 DIAGNOSIS — C787 Secondary malignant neoplasm of liver and intrahepatic bile duct: Secondary | ICD-10-CM | POA: Diagnosis present

## 2017-05-08 DIAGNOSIS — D638 Anemia in other chronic diseases classified elsewhere: Secondary | ICD-10-CM | POA: Diagnosis present

## 2017-05-08 DIAGNOSIS — G8929 Other chronic pain: Secondary | ICD-10-CM | POA: Diagnosis present

## 2017-05-08 DIAGNOSIS — Z85828 Personal history of other malignant neoplasm of skin: Secondary | ICD-10-CM | POA: Diagnosis not present

## 2017-05-08 DIAGNOSIS — R7881 Bacteremia: Secondary | ICD-10-CM | POA: Diagnosis present

## 2017-05-08 DIAGNOSIS — T8579XA Infection and inflammatory reaction due to other internal prosthetic devices, implants and grafts, initial encounter: Secondary | ICD-10-CM | POA: Diagnosis present

## 2017-05-08 DIAGNOSIS — A4159 Other Gram-negative sepsis: Secondary | ICD-10-CM | POA: Diagnosis present

## 2017-05-08 DIAGNOSIS — K8309 Other cholangitis: Secondary | ICD-10-CM

## 2017-05-08 DIAGNOSIS — C250 Malignant neoplasm of head of pancreas: Secondary | ICD-10-CM | POA: Diagnosis not present

## 2017-05-08 DIAGNOSIS — E119 Type 2 diabetes mellitus without complications: Secondary | ICD-10-CM | POA: Diagnosis present

## 2017-05-08 DIAGNOSIS — E785 Hyperlipidemia, unspecified: Secondary | ICD-10-CM | POA: Diagnosis present

## 2017-05-08 DIAGNOSIS — E039 Hypothyroidism, unspecified: Secondary | ICD-10-CM | POA: Diagnosis present

## 2017-05-08 DIAGNOSIS — K311 Adult hypertrophic pyloric stenosis: Secondary | ICD-10-CM | POA: Diagnosis present

## 2017-05-08 DIAGNOSIS — A419 Sepsis, unspecified organism: Secondary | ICD-10-CM | POA: Diagnosis not present

## 2017-05-08 DIAGNOSIS — E118 Type 2 diabetes mellitus with unspecified complications: Secondary | ICD-10-CM

## 2017-05-08 LAB — URINALYSIS, ROUTINE W REFLEX MICROSCOPIC
BACTERIA UA: NONE SEEN
Bilirubin Urine: NEGATIVE
GLUCOSE, UA: 150 mg/dL — AB
Hgb urine dipstick: NEGATIVE
KETONES UR: NEGATIVE mg/dL
Leukocytes, UA: NEGATIVE
Nitrite: NEGATIVE
PROTEIN: 30 mg/dL — AB
Specific Gravity, Urine: 1.019 (ref 1.005–1.030)
Squamous Epithelial / LPF: NONE SEEN
pH: 5 (ref 5.0–8.0)

## 2017-05-08 LAB — COMPREHENSIVE METABOLIC PANEL
ALBUMIN: 2.6 g/dL — AB (ref 3.5–5.0)
ALK PHOS: 622 U/L — AB (ref 38–126)
ALT: 48 U/L (ref 17–63)
AST: 47 U/L — AB (ref 15–41)
Anion gap: 9 (ref 5–15)
BUN: 16 mg/dL (ref 6–20)
CALCIUM: 8.2 mg/dL — AB (ref 8.9–10.3)
CHLORIDE: 98 mmol/L — AB (ref 101–111)
CO2: 25 mmol/L (ref 22–32)
CREATININE: 0.83 mg/dL (ref 0.61–1.24)
GFR calc non Af Amer: >60 mL/min (ref >60)
GLUCOSE: 226 mg/dL — AB (ref 65–99)
Potassium: 3.5 mmol/L (ref 3.5–5.1)
SODIUM: 132 mmol/L — AB (ref 135–145)
Total Bilirubin: 2.8 mg/dL — ABNORMAL HIGH (ref 0.3–1.2)
Total Protein: 6.2 g/dL — ABNORMAL LOW (ref 6.5–8.1)

## 2017-05-08 LAB — CBC WITH DIFFERENTIAL/PLATELET
BASOS ABS: 0 10*3/uL (ref 0.0–0.1)
BASOS PCT: 0 %
EOS ABS: 0.1 10*3/uL (ref 0.0–0.7)
EOS PCT: 1 %
HCT: 22.7 % — ABNORMAL LOW (ref 39.0–52.0)
HEMOGLOBIN: 7.9 g/dL — AB (ref 13.0–17.0)
LYMPHS ABS: 0.6 10*3/uL — AB (ref 0.7–4.0)
Lymphocytes Relative: 11 %
MCH: 30.7 pg (ref 26.0–34.0)
MCHC: 34.8 g/dL (ref 30.0–36.0)
MCV: 88.3 fL (ref 78.0–100.0)
Monocytes Absolute: 0.8 10*3/uL (ref 0.1–1.0)
Monocytes Relative: 13 %
NEUTROS PCT: 75 %
Neutro Abs: 4.4 10*3/uL (ref 1.7–7.7)
PLATELETS: 113 10*3/uL — AB (ref 150–400)
RBC: 2.57 MIL/uL — AB (ref 4.22–5.81)
RDW: 16.3 % — ABNORMAL HIGH (ref 11.5–15.5)
WBC: 5.8 10*3/uL (ref 4.0–10.5)

## 2017-05-08 LAB — LIPASE, BLOOD: Lipase: <10 U/L — ABNORMAL LOW (ref 11–51)

## 2017-05-08 LAB — I-STAT CG4 LACTIC ACID, ED: LACTIC ACID, VENOUS: 1.23 mmol/L (ref 0.5–1.9)

## 2017-05-08 LAB — GLUCOSE, CAPILLARY: Glucose-Capillary: 189 mg/dL — ABNORMAL HIGH (ref 65–99)

## 2017-05-08 MED ORDER — INSULIN ASPART 100 UNIT/ML ~~LOC~~ SOLN: 0.0000 [IU] | Freq: Three times a day (TID) | SUBCUTANEOUS | Status: DC

## 2017-05-08 MED ORDER — TRAMADOL HCL 50 MG PO TABS: 50.0000 mg | ORAL_TABLET | Freq: Four times a day (QID) | ORAL | Status: DC | PRN

## 2017-05-08 MED ORDER — PANCRELIPASE (LIP-PROT-AMYL) 12000-38000 UNITS PO CPEP
36000.0000 [IU] | ORAL_CAPSULE | Freq: Three times a day (TID) | ORAL | Status: DC
  Administered 2017-05-09 – 2017-05-12 (×10): 36000 [IU] via ORAL
  Filled 2017-05-08 (×11): qty 3

## 2017-05-08 MED ORDER — SODIUM CHLORIDE 0.9 % IV SOLN
INTRAVENOUS | Status: DC
  Administered 2017-05-09 (×2): via INTRAVENOUS

## 2017-05-08 MED ORDER — SODIUM CHLORIDE 0.9 % IV BOLUS (SEPSIS)
1000.0000 mL | Freq: Once | INTRAVENOUS | Status: AC
  Administered 2017-05-08: 1000 mL via INTRAVENOUS

## 2017-05-08 MED ORDER — LORATADINE 10 MG PO TABS
10.0000 mg | ORAL_TABLET | Freq: Every day | ORAL | Status: DC
  Administered 2017-05-09 – 2017-05-12 (×4): 10 mg via ORAL
  Filled 2017-05-08 (×4): qty 1

## 2017-05-08 MED ORDER — HEPARIN SODIUM (PORCINE) 5000 UNIT/ML IJ SOLN
5000.0000 [IU] | Freq: Three times a day (TID) | INTRAMUSCULAR | Status: DC
  Administered 2017-05-09 – 2017-05-10 (×6): 5000 [IU] via SUBCUTANEOUS
  Filled 2017-05-08 (×8): qty 1

## 2017-05-08 MED ORDER — DEXTROSE 5 % IV SOLN
2.0000 g | Freq: Once | INTRAVENOUS | Status: AC
  Administered 2017-05-08: 2 g via INTRAVENOUS
  Filled 2017-05-08: qty 2

## 2017-05-08 MED ORDER — ACETAMINOPHEN 325 MG PO TABS
650.0000 mg | ORAL_TABLET | Freq: Four times a day (QID) | ORAL | Status: DC | PRN
  Administered 2017-05-09 – 2017-05-11 (×3): 650 mg via ORAL
  Filled 2017-05-08 (×3): qty 2

## 2017-05-08 MED ORDER — ACETAMINOPHEN 500 MG PO TABS
1000.0000 mg | ORAL_TABLET | Freq: Once | ORAL | Status: AC
  Administered 2017-05-08: 1000 mg via ORAL
  Filled 2017-05-08: qty 2

## 2017-05-08 MED ORDER — CEFEPIME HCL 2 G IJ SOLR
2.0000 g | Freq: Three times a day (TID) | INTRAMUSCULAR | Status: AC
  Administered 2017-05-09 – 2017-05-11 (×9): 2 g via INTRAVENOUS
  Filled 2017-05-08 (×9): qty 2

## 2017-05-08 MED ORDER — INSULIN DETEMIR 100 UNIT/ML ~~LOC~~ SOLN
25.0000 [IU] | Freq: Every day | SUBCUTANEOUS | Status: DC
  Administered 2017-05-09 – 2017-05-11 (×3): 25 [IU] via SUBCUTANEOUS
  Filled 2017-05-08 (×4): qty 0.25

## 2017-05-08 MED ORDER — LEVOTHYROXINE SODIUM 25 MCG PO TABS
25.0000 ug | ORAL_TABLET | Freq: Every day | ORAL | Status: DC
  Administered 2017-05-09 – 2017-05-12 (×4): 25 ug via ORAL
  Filled 2017-05-08 (×4): qty 1

## 2017-05-08 MED ORDER — POLYETHYLENE GLYCOL 3350 17 G PO PACK: 17.0000 g | PACK | Freq: Every day | ORAL | Status: DC | PRN

## 2017-05-08 MED ORDER — PROCHLORPERAZINE MALEATE 10 MG PO TABS: 10.0000 mg | ORAL_TABLET | Freq: Four times a day (QID) | ORAL | Status: DC | PRN

## 2017-05-08 NOTE — Progress Notes (Unsigned)
Positive Blood culture on Right arm and PAC, gram negative rods recovered from aerobic and anaerobic bottles, report given to [name] (NP) 0341pm 05/08/17 by Wonda Olds)

## 2017-05-08 NOTE — Telephone Encounter (Signed)
Received notification of gram negative rods on blood culture. Called pt's wife with instructions to take him to [facility] ED now, per [name], NP/ Dr. Burr Medico. She agreed to do so. Notified Tim, Agricultural consultant in [place] ED of lab and that pt is on the way.

## 2017-05-08 NOTE — H&P (Signed)
History and Physical    Jacob Hardin MWU:[id number] DOB: 02-02-1946 DOA: 05/08/2017  PCP: [name], MD   Patient coming from: Home.  I have personally briefly reviewed patient's old medical records in [place]  Chief Complaint: Fever and positive blood cultures.  HPI: TEE RICHESON is a 71 y.o. 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 He denies headache, sore throat, productive cough, dyspnea, chest pain, palpitations, dizziness, diaphoresis or pitting edema of the lower extremities. He has chronic abdominal pain, mild jaundice and occasional nausea, but states that these symptoms are unchanged. He denies emesis, diarrhea, constipation, melena or hematochezia. He denies dysuria, frequency or hematuria. He complains of occasional pruritus.  ED Course: Initial vital signs temperature 99.21F, pulse 87, blood pressure 119/74 mmHg, respirations 18 and O2 sat 99% on room air. He subsequently was packed a fever of 101.72F in the emergency department. WBC is 5.8, hemoglobin 7.9 g/dL and platelets 113. His urinalysis showed mild proteinuria. His lactic acid was normal. Sodium 132, potassium 3.5, chloride 98 and bicarbonate 25 mmol/L. BUN was 16, creatinine 0.83, calcium 8.2 and glucose 226 mg/dL. Albumin was 2.6 and total protein 6.2 g/dL. Bilirubin was 2.8 mg/dL and alkaline phosphatase 622.  Review of Systems: As per HPI otherwise 10 point review of systems negative.    Past Medical History:  Diagnosis Date  . Elevated liver enzymes   .  GERD (gastroesophageal reflux disease)   . Hyperlipidemia   . Hypertension    recently weight lost-no meds now-running low.  . Obstructive jaundice   . Oxalate nephropathy   . PONV (postoperative nausea and vomiting)   . Primary pancreatic adenocarcinoma (HCC)    mets to liver and duodenum  . Pruritus   . Restless legs   . Skin cancer    "burned off my face & head"  . Type II diabetes mellitus ([place])   . Weight loss     Past Surgical History:  Procedure Laterality Date  . ANTERIOR CERVICAL DECOMP/DISCECTOMY FUSION  1994   cervical-bone graft  . BACK SURGERY    . COLONOSCOPY    . DUODENAL STENT PLACEMENT N/A 11/22/2015   Procedure: DUODENAL STENT PLACEMENT;  Surgeon: Carol Ada, MD;  Location: [place];  Service: Endoscopy;  Laterality: N/A;  . DUODENAL STENT PLACEMENT N/A 04/10/2017   Procedure: DUODENAL STENT PLACEMENT;  Surgeon: Carol Ada, MD;  Location: [facility] ENDOSCOPY;  Service: Endoscopy;  Laterality: N/A;  . ERCP N/A 10/26/2016   Procedure: ENDOSCOPIC RETROGRADE CHOLANGIOPANCREATOGRAPHY (ERCP);  Surgeon: Carol Ada, MD;  Location: [facility] ENDOSCOPY;  Service: Endoscopy;  Laterality: N/A;  . ESOPHAGOGASTRODUODENOSCOPY N/A 11/18/2015   Procedure: ESOPHAGOGASTRODUODENOSCOPY (EGD);  Surgeon: Carol Ada, MD;  Location: [facility] ENDOSCOPY;  Service: Endoscopy;  Laterality: N/A;  . ESOPHAGOGASTRODUODENOSCOPY N/A 11/22/2015   Procedure: ESOPHAGOGASTRODUODENOSCOPY (EGD);  Surgeon: Carol Ada, MD;  Location: [facility] ENDOSCOPY;  Service: Endoscopy;  Laterality: N/A;  Uncovered duodenal stent placement.  Fluoroscopy required.  . ESOPHAGOGASTRODUODENOSCOPY N/A 10/14/2016   Procedure: ESOPHAGOGASTRODUODENOSCOPY (EGD);  Surgeon: Carol Ada, MD;  Location: [facility];  Service: Endoscopy;  Laterality: N/A;  . ESOPHAGOGASTRODUODENOSCOPY (EGD) WITH PROPOFOL N/A 04/10/2017  Procedure: ESOPHAGOGASTRODUODENOSCOPY (EGD) WITH PROPOFOL;  Surgeon: Carol Ada, MD;  Location: [facility] ENDOSCOPY;  Service:  Endoscopy;  Laterality: N/A;  . EUS  11/18/2015   Procedure: UPPER ENDOSCOPIC ULTRASOUND (EUS) LINEAR;  Surgeon: Carol Ada, MD;  Location: [place];  Service: Endoscopy;;  . INGUINAL HERNIA REPAIR Left   . IR CHOLANGIOGRAM EXISTING TUBE  04/15/2017  . IR CONVERT BILIARY DRAIN TO INT EXT BILIARY DRAIN  03/20/2017  . IR EXCHANGE BILIARY DRAIN  02/12/2017  . IR EXCHANGE BILIARY DRAIN  03/12/2017  . IR GENERIC HISTORICAL  10/16/2016   IR BILIARY DRAIN PLACEMENT WITH CHOLANGIOGRAM 10/16/2016 Marybelle Killings, MD [facility]  . IR GENERIC HISTORICAL  11/07/2016   IR EXCHANGE BILIARY DRAIN 11/07/2016 Arne Cleveland, MD [facility]  . IR GENERIC HISTORICAL  11/12/2016   IR PATIENT EVAL TECH 0-60 MINS 11/12/2016 Arne Cleveland, MD [facility]  . SHOULDER ARTHROSCOPY W/ ROTATOR CUFF REPAIR Left 2014  . TONSILLECTOMY    . WISDOM TOOTH EXTRACTION       reports that he has never smoked. He has never used smokeless tobacco. He reports that he does not drink alcohol or use drugs.  Allergies  Allergen Reactions  . Codeine Nausea Only    Family History  Problem Relation Age of Onset  . Diabetes Mother   . Hypertension Mother   . Cancer Father        lung 51  . Hypertension Father   . Cancer Brother        pancreatic   . Diabetes Brother   . Cancer Paternal Grandfather        colon  . Cancer Paternal Uncle        prostate     Prior to Admission medications   Medication Sig Start Date End Date Taking? Authorizing Provider  acetaminophen (TYLENOL) 500 MG tablet Take 1,000 mg by mouth every 6 (six) hours as needed.   Yes [provider]  baclofen (LIORESAL) 10 MG tablet Take 5 mg by mouth 3 (three) times daily as needed (for hiccups).   Yes [provider]  ibuprofen (ADVIL,MOTRIN) 200 MG tablet Take 400 mg by mouth every 6 (six) hours as needed.   Yes [provider]  insulin aspart (NOVOLOG FLEXPEN) 100 UNIT/ML FlexPen Inject 4 Units into the skin daily before  supper.   Yes [provider]  Insulin Detemir (LEVEMIR FLEXTOUCH) 100 UNIT/ML Pen Inject 25 Units into the skin daily. 04/16/17  Yes Rosita Fire, MD  levofloxacin (LEVAQUIN) 500 MG tablet Take 1 tablet (500 mg total) by mouth daily. 05/06/17  Yes Owens Shark, NP  levothyroxine (SYNTHROID, LEVOTHROID) 25 MCG tablet Take 1 tablet (25 mcg total) by mouth daily before breakfast. 04/17/17  Yes Rosita Fire, MD  lidocaine-prilocaine (EMLA) cream Apply 1 application topically as needed (prior to accessing port). 04/22/17  Yes Ladell Pier, MD  lipase/protease/amylase (CREON) 36000 UNITS CPEP capsule Take 1 capsule (36,000 Units total) by mouth 3 (three) times daily before meals. 02/22/17  Yes Ladell Pier, MD  loratadine (CLARITIN) 10 MG tablet Take 10 mg by mouth daily.    Yes [provider]  Multiple Vitamin (MULTIVITAMIN WITH MINERALS) TABS tablet Take 1 tablet by mouth daily.   Yes [provider]  ondansetron (ZOFRAN) 8 MG tablet Take 1 tablet (8 mg total) by mouth every 8 (eight) hours as needed for nausea or vomiting. 03/05/17  Yes Owens Shark, NP  polyethylene glycol [facility] /  GLYCOLAX) packet Take 17 g by mouth daily as needed for mild constipation.    Yes [provider]  prochlorperazine (COMPAZINE) 10 MG tablet Take 1 tablet (10 mg total) by mouth every 6 (six) hours as needed for nausea or vomiting. 03/05/17  Yes Owens Shark, NP  traMADol (ULTRAM) 50 MG tablet Take 1 tablet (50 mg total) by mouth every 6 (six) hours as needed. 02/15/17  Yes Theodis Blaze, MD  BD PEN NEEDLE NANO U/F 32G X 4 MM MISC USE TO INJECT INSULIN TWICE A DAY 11/06/16   [name], MD  ONE TOUCH ULTRA TEST test strip TEST TWICE A DAY 04/22/17   [name], MD    Physical Exam: Vitals:   05/08/17 1851 05/08/17 2116 05/08/17 2149 05/08/17 2300  BP: 122/66 (!) 144/75  138/83  Pulse: 71 74  77  Resp: 16 19  18   Temp:   (!) 101.3 F (38.5 C)  100.2 F (37.9 C)  TempSrc:   Oral Oral  SpO2: 100% 98%  99%  Weight:      Height:    6' (1.829 m)    Constitutional: Febrile, but in NAD, calm, comfortable Eyes: PERRL, lids and conjunctivae normal ENMT: Mucous membranes are moist. Posterior pharynx clear of any exudate or lesions.  Neck: normal, supple, no masses, no thyromegaly Respiratory: clear to auscultation bilaterally, no wheezing, no crackles. Normal respiratory effort. No accessory muscle use.  Cardiovascular: Regular rate and rhythm, no murmurs / rubs / gallops. No extremity edema. 2+ pedal pulses. No carotid bruits.  Abdomen: Positive biliary drain, soft, positive RUQ and epigastric tenderness, no guarding/rebound/masses palpated. No hepatosplenomegaly. Bowel sounds positive.  Musculoskeletal: no clubbing / cyanosis. Good ROM, no contractures. Normal muscle tone.  Skin: no rashes, lesions, ulcers on limited skin exam. Neurologic: CN 2-12 grossly intact. Sensation intact, DTR normal. Strength 5/5 in all 4.  Psychiatric: Normal judgment and insight. Alert and oriented x 4. Normal mood.    Labs on Admission: I have personally reviewed following labs and imaging studies  CBC:  Recent Labs Lab 05/06/17 1131 05/08/17 2017  WBC 5.4 5.8  NEUTROABS 4.1 4.4  HGB 8.6* 7.9*  HCT 26.6* 22.7*  MCV 92.0 88.3  PLT 110* 300*   Basic Metabolic Panel:  Recent Labs Lab 05/06/17 1131 05/08/17 2017  NA 136 132*  K 3.6 3.5  CL  --  98*  CO2 25 25  GLUCOSE 231* 226*  BUN 18.5 16  CREATININE 1.0 0.83  CALCIUM 9.0 8.2*   GFR: Estimated Creatinine Clearance: 80.3 mL/min (by C-G formula based on SCr of 0.83 mg/dL). Liver Function Tests:  Recent Labs Lab 05/06/17 1131 05/08/17 2017  AST 60* 47*  ALT 59* 48  ALKPHOS 496* 622*  BILITOT 3.50* 2.8*  PROT 6.5 6.2*  ALBUMIN 2.7* 2.6*    Recent Labs Lab 05/08/17 2017  LIPASE <10*   No results for input(s): AMMONIA in the last 168 hours. Coagulation Profile: No  results for input(s): INR, PROTIME in the last 168 hours. Cardiac Enzymes: No results for input(s): CKTOTAL, CKMB, CKMBINDEX, TROPONINI in the last 168 hours. BNP (last 3 results) No results for input(s): PROBNP in the last 8760 hours. HbA1C: No results for input(s): HGBA1C in the last 72 hours. CBG: No results for input(s): GLUCAP in the last 168 hours. Lipid Profile: No results for input(s): CHOL, HDL, LDLCALC, TRIG, CHOLHDL, LDLDIRECT in the last 72 hours. Thyroid Function Tests: No results for input(s): TSH,  T4TOTAL, FREET4, T3FREE, THYROIDAB in the last 72 hours. Anemia Panel: No results for input(s): VITAMINB12, FOLATE, FERRITIN, TIBC, IRON, RETICCTPCT in the last 72 hours. Urine analysis:    Component Value Date/Time   COLORURINE AMBER (A) 05/08/2017 1903   APPEARANCEUR CLEAR 05/08/2017 1903   LABSPEC 1.019 05/08/2017 1903   PHURINE 5.0 05/08/2017 1903   GLUCOSEU 150 (A) 05/08/2017 1903   HGBUR NEGATIVE 05/08/2017 1903   BILIRUBINUR NEGATIVE 05/08/2017 [place] NEGATIVE 05/08/2017 1903   PROTEINUR 30 (A) 05/08/2017 1903   NITRITE NEGATIVE 05/08/2017 1903   LEUKOCYTESUR NEGATIVE 05/08/2017 1903   Echo completely without imaging enhancing agent 01/21/2016 ------------------------------------------------------------------- LV EF: 60% -   65%  ------------------------------------------------------------------- Indications:     Fever  ------------------------------------------------------------------- History:   Risk factors:  Hypertension. Diabetes mellitus. Dyslipidemia.  ------------------------------------------------------------------- Study Conclusions  - Left ventricle: The cavity size was normal. Systolic function was   normal. The estimated ejection fraction was in the range of 60%   to 65%. Wall motion was normal; there were no regional wall   motion abnormalities. - Aortic valve: Moderately calcified annulus. Trileaflet. Moderate   diffuse  thickening and calcification, consistent with sclerosis. - Mitral valve: There was trivial regurgitation. - Tricuspid valve: There was trivial regurgitation.  Radiological Exams on Admission: No results found.  EKG: Independently reviewed.   Assessment/Plan Principal Problem:   Gram-negative bacteremia Admit to telemetry unit/inpatient. Continue IV fluids. Supplemental oxygen as needed. Continue cefepime 2 g IVPB every 8 hours. Follow-up blood cultures and sensitivity.  Active Problems:   Obstructive jaundice Biliary drain in place. Check daily CMP.      Primary pancreatic cancer with metastasis to other site [facility]) He is scheduled to see oncology on Monday.    GERD (gastroesophageal reflux disease) Protonix 40 mg by mouth daily.    Hypothyroidism Continue levothyroxine 25 g by mouth daily.      DM2 (diabetes mellitus, type 2) (HCC) Carbohydrate modified diet. Continue Levemir 25 units SQ daily. CBG monitoring a regular insulin sliding scale.    Hypertension Per patient, his blood pressure is better now after weight loss. Currently not taking medications. Follow-up blood pressure.    Hyperlipidemia Has stopped the medication after weight loss and abnormal LFTs.    DVT prophylaxis: Heparin SQ. Code Status: DO NOT RESUSCITATE/DO NOT INTUBATE. Family Communication:  Disposition Plan: Admit for IV antibiotics for 2-3 days, pending blood cultures. Consults called:  Admission status: Inpatient/telemetry.   Reubin Milan MD [facility] Pager [phone].  If 7PM-7AM, please contact night-coverage [email or web address] Password [email or web address]  05/08/2017, 11:18 PM

## 2017-05-08 NOTE — ED Triage Notes (Signed)
[facility] phones prior to his arrival to tell us that pt. Was seen 3 days ago and placed on Levaquin d/t their findings. They rec'd. A positive prelim. Blood culture today and have advised pt. To come to E.D.

## 2017-05-08 NOTE — ED Triage Notes (Signed)
Pt from cancer center with ongoing fever since Sunday. Pt has been taking tylenol and motrin alternating for fever at home. Pt has been taking levoquin at home for infection. Pt also has increased bilirubin and has not been able to get chemo on Monday. Pt was seen by his endo specialist this morning who told him everything was okay and then he was called following his appointment and notified that his blood cultures that were drawn 3 days ago came back positive.

## 2017-05-08 NOTE — ED Provider Notes (Signed)
[facility] DEPT Provider Note   CSN: [id number] Arrival date & time: 05/08/17  1647     History   Chief Complaint No chief complaint on file.   HPI Jacob Hardin is a 71 y.o. male.  HPI   Sunday began to have fever, was 102.7. Was seen on Monday, obtained blood cultures.  Started on levaquin Monday.  Still having temperatures 99. Monday was 101.  No other fevers since then, just 99.    No cough, no urinary symptoms, no rash, normal bowel movements. Eating pretty well.  No increased abdominal pain.  Past Medical History:  Diagnosis Date  . Elevated liver enzymes   . GERD (gastroesophageal reflux disease)   . Hyperlipidemia   . Hypertension    recently weight lost-no meds now-running low.  . Obstructive jaundice   . Oxalate nephropathy   . PONV (postoperative nausea and vomiting)   . Primary pancreatic adenocarcinoma (HCC)    mets to liver and duodenum  . Pruritus   . Restless legs   . Skin cancer    "burned off my face & head"  . Type II diabetes mellitus ([place])   . Weight loss     Patient Active Problem List   Diagnosis Date Noted  . Gram-negative bacteremia 05/08/2017  . Goals of care, counseling/discussion 04/22/2017  . Bacteremia due to Enterobacter species 04/15/2017  . History of Clostridium difficile colitis 04/15/2017  . Severe sepsis ([place]) 02/09/2017  . DM2 (diabetes mellitus, type 2) ([place]) 02/09/2017  . Pulmonary nodule, right 02/09/2017  . Biliary obstruction   . Obstructive jaundice   . GERD (gastroesophageal reflux disease) 10/12/2016  . Hyperbilirubinemia 10/12/2016  . Hypokalemia 10/12/2016  . Port catheter in place 02/14/2016  . Malnutrition of moderate degree 01/21/2016  . Immunocompromised patient ([place])   . Viridans streptococci infection   . Bacteremia due to Klebsiella pneumoniae   . Central line infection   . Primary pancreatic cancer with metastasis to other site [facility])   . Sepsis ([place]) 01/17/2016  . SIRS (systemic  inflammatory response syndrome) ([place]) 01/17/2016  . Cancer of head of pancreas ([place]) 11/24/2015  . Diabetes mellitus without complication ([place])   . Hyperlipidemia   . Hypertension     Past Surgical History:  Procedure Laterality Date  . ANTERIOR CERVICAL DECOMP/DISCECTOMY FUSION  1994   cervical-bone graft  . BACK SURGERY    . COLONOSCOPY    . DUODENAL STENT PLACEMENT N/A 11/22/2015   Procedure: DUODENAL STENT PLACEMENT;  Surgeon: Carol Ada, MD;  Location: [facility];  Service: Endoscopy;  Laterality: N/A;  . DUODENAL STENT PLACEMENT N/A 04/10/2017   Procedure: DUODENAL STENT PLACEMENT;  Surgeon: Carol Ada, MD;  Location: [facility] ENDOSCOPY;  Service: Endoscopy;  Laterality: N/A;  . ERCP N/A 10/26/2016   Procedure: ENDOSCOPIC RETROGRADE CHOLANGIOPANCREATOGRAPHY (ERCP);  Surgeon: Carol Ada, MD;  Location: [facility] ENDOSCOPY;  Service: Endoscopy;  Laterality: N/A;  . ESOPHAGOGASTRODUODENOSCOPY N/A 11/18/2015   Procedure: ESOPHAGOGASTRODUODENOSCOPY (EGD);  Surgeon: Carol Ada, MD;  Location[facility] ENDOSCOPY;  Service: Endoscopy;  Laterality: N/A;  . ESOPHAGOGASTRODUODENOSCOPY N/A 11/22/2015   Procedure: ESOPHAGOGASTRODUODENOSCOPY (EGD);  Surgeon: Carol Ada, MD;  Location: [facility] ENDOSCOPY;  Service: Endoscopy;  Laterality: N/A;  Uncovered duodenal stent placement.  Fluoroscopy required.  . ESOPHAGOGASTRODUODENOSCOPY N/A 10/14/2016   Procedure: ESOPHAGOGASTRODUODENOSCOPY (EGD);  Surgeon: Carol Ada, MD;  Location: [facility] ENDOSCOPY;  Service: Endoscopy;  Laterality: N/A;  . ESOPHAGOGASTRODUODENOSCOPY (EGD) WITH PROPOFOL N/A 04/10/2017   Procedure: ESOPHAGOGASTRODUODENOSCOPY (EGD) WITH PROPOFOL;  Surgeon: Carol Ada, MD;  Location: [facility] ENDOSCOPY;  Service: Endoscopy;  Laterality: N/A;  . EUS  11/18/2015   Procedure: UPPER ENDOSCOPIC ULTRASOUND (EUS) LINEAR;  Surgeon: Carol Ada, MD;  Location: [facility];  Service: Endoscopy;;  . INGUINAL HERNIA REPAIR Left   . IR CHOLANGIOGRAM EXISTING TUBE   04/15/2017  . IR CONVERT BILIARY DRAIN TO INT EXT BILIARY DRAIN  03/20/2017  . IR EXCHANGE BILIARY DRAIN  02/12/2017  . IR EXCHANGE BILIARY DRAIN  03/12/2017  . IR GENERIC HISTORICAL  10/16/2016   IR BILIARY DRAIN PLACEMENT WITH CHOLANGIOGRAM 10/16/2016 Marybelle Killings, MD [facility]  . IR GENERIC HISTORICAL  11/07/2016   IR EXCHANGE BILIARY DRAIN 11/07/2016 Arne Cleveland, MD [facility]  . IR GENERIC HISTORICAL  11/12/2016   IR PATIENT EVAL TECH 0-60 MINS 11/12/2016 Arne Cleveland, MD [facility]  . SHOULDER ARTHROSCOPY W/ ROTATOR CUFF REPAIR Left 2014  . TONSILLECTOMY    . WISDOM TOOTH EXTRACTION         Home Medications    Prior to Admission medications   Medication Sig Start Date End Date Taking? Authorizing Provider  acetaminophen (TYLENOL) 500 MG tablet Take 1,000 mg by mouth every 6 (six) hours as needed.   Yes [provider]  baclofen (LIORESAL) 10 MG tablet Take 5 mg by mouth 3 (three) times daily as needed (for hiccups).   Yes [provider]  ibuprofen (ADVIL,MOTRIN) 200 MG tablet Take 400 mg by mouth every 6 (six) hours as needed.   Yes [provider]  insulin aspart (NOVOLOG FLEXPEN) 100 UNIT/ML FlexPen Inject 4 Units into the skin daily before supper.   Yes [provider]  Insulin Detemir (LEVEMIR FLEXTOUCH) 100 UNIT/ML Pen Inject 25 Units into the skin daily. 04/16/17  Yes Rosita Fire, MD  levofloxacin (LEVAQUIN) 500 MG tablet Take 1 tablet (500 mg total) by mouth daily. 05/06/17  Yes Owens Shark, NP  levothyroxine (SYNTHROID, LEVOTHROID) 25 MCG tablet Take 1 tablet (25 mcg total) by mouth daily before breakfast. 04/17/17  Yes Rosita Fire, MD  lidocaine-prilocaine (EMLA) cream Apply 1 application topically as needed (prior to accessing port). 04/22/17  Yes Ladell Pier, MD  lipase/protease/amylase (CREON) 36000 UNITS CPEP capsule Take 1 capsule (36,000 Units total) by mouth 3 (three) times daily before meals. 02/22/17   Yes Ladell Pier, MD  loratadine (CLARITIN) 10 MG tablet Take 10 mg by mouth daily.    Yes [provider]  Multiple Vitamin (MULTIVITAMIN WITH MINERALS) TABS tablet Take 1 tablet by mouth daily.   Yes [provider]  ondansetron (ZOFRAN) 8 MG tablet Take 1 tablet (8 mg total) by mouth every 8 (eight) hours as needed for nausea or vomiting. 03/05/17  Yes Owens Shark, NP  polyethylene glycol (MIRALAX / GLYCOLAX) packet Take 17 g by mouth daily as needed for mild constipation.    Yes [provider]  prochlorperazine (COMPAZINE) 10 MG tablet Take 1 tablet (10 mg total) by mouth every 6 (six) hours as needed for nausea or vomiting. 03/05/17  Yes Owens Shark, NP  traMADol (ULTRAM) 50 MG tablet Take 1 tablet (50 mg total) by mouth every 6 (six) hours as needed. 02/15/17  Yes Theodis Blaze, MD  BD PEN NEEDLE NANO U/F 32G X 4 MM MISC USE TO INJECT INSULIN TWICE A DAY 11/06/16   [name], MD  ONE TOUCH ULTRA TEST test strip TEST TWICE A DAY 04/22/17   [name], MD  Family History Family History  Problem Relation Age of Onset  . Diabetes Mother   . Hypertension Mother   . Cancer Father        lung 56  . Hypertension Father   . Cancer Brother        pancreatic   . Diabetes Brother   . Cancer Paternal Grandfather        colon  . Cancer Paternal Uncle        prostate    Social History Social History  Substance Use Topics  . Smoking status: Never Smoker  . Smokeless tobacco: Never Used  . Alcohol use No     Allergies   Codeine   Review of Systems Review of Systems  Constitutional: Positive for chills and fever.  HENT: Negative for congestion and sore throat.   Eyes: Negative for visual disturbance.  Respiratory: Negative for cough and shortness of breath.   Cardiovascular: Negative for chest pain.  Gastrointestinal: Negative for abdominal pain, constipation, diarrhea, nausea and vomiting.       Bili drain slowing down now    Genitourinary: Negative for difficulty urinating and dysuria.  Musculoskeletal: Negative for back pain and neck stiffness.  Skin: Negative for rash.  Neurological: Negative for syncope and headaches.     Physical Exam Updated Vital Signs BP (!) 144/67 (BP Location: Left Arm)   Pulse 70   Temp 99.1 F (37.3 C) (Oral)   Resp 18   Ht 6' (1.829 m)   Wt 67.3 kg (148 lb 6.4 oz)   SpO2 100%   BMI 20.13 kg/m   Physical Exam  Constitutional: He is oriented to person, place, and time. He appears well-developed and well-nourished. No distress.  HENT:  Head: Normocephalic and atraumatic.  Eyes: Conjunctivae and EOM are normal.  Neck: Normal range of motion.  Cardiovascular: Normal rate, regular rhythm, normal heart sounds and intact distal pulses.  Exam reveals no gallop and no friction rub.   No murmur heard. Pulmonary/Chest: Effort normal and breath sounds normal. No respiratory distress. He has no wheezes. He has no rales.  Abdominal: Soft. He exhibits no distension. There is no tenderness. There is no guarding.  Biliary drain in place, biliary fluid present  Musculoskeletal: He exhibits no edema.  Neurological: He is alert and oriented to person, place, and time.  Skin: Skin is warm and dry. He is not diaphoretic.  Nursing note and vitals reviewed.    ED Treatments / Results  Labs (all labs ordered are listed, but only abnormal results are displayed) Labs Reviewed  CBC WITH DIFFERENTIAL/PLATELET - Abnormal; Notable for the following:       Result Value   RBC 2.57 (*)    Hemoglobin 7.9 (*)    HCT 22.7 (*)    RDW 16.3 (*)    Platelets 113 (*)    Lymphs Abs 0.6 (*)    All other components within normal limits  COMPREHENSIVE METABOLIC PANEL - Abnormal; Notable for the following:    Sodium 132 (*)    Chloride 98 (*)    Glucose, Bld 226 (*)    Calcium 8.2 (*)    Total Protein 6.2 (*)    Albumin 2.6 (*)    AST 47 (*)    Alkaline Phosphatase 622 (*)    Total Bilirubin  2.8 (*)    All other components within normal limits  LIPASE, BLOOD - Abnormal; Notable for the following:    Lipase <10 (*)    All other  components within normal limits  URINALYSIS, ROUTINE W REFLEX MICROSCOPIC - Abnormal; Notable for the following:    Color, Urine AMBER (*)    Glucose, UA 150 (*)    Protein, ur 30 (*)    All other components within normal limits  COMPREHENSIVE METABOLIC PANEL - Abnormal; Notable for the following:    Potassium 3.2 (*)    Glucose, Bld 216 (*)    Calcium 8.1 (*)    Total Protein 5.9 (*)    Albumin 2.5 (*)    AST 48 (*)    Alkaline Phosphatase 673 (*)    Total Bilirubin 2.7 (*)    All other components within normal limits  CBC WITH DIFFERENTIAL/PLATELET - Abnormal; Notable for the following:    RBC 3.26 (*)    Hemoglobin 10.0 (*)    HCT 29.1 (*)    RDW 16.4 (*)    Platelets 93 (*)    All other components within normal limits  GLUCOSE, CAPILLARY - Abnormal; Notable for the following:    Glucose-Capillary 189 (*)    All other components within normal limits  GLUCOSE, CAPILLARY - Abnormal; Notable for the following:    Glucose-Capillary 203 (*)    All other components within normal limits  GLUCOSE, CAPILLARY - Abnormal; Notable for the following:    Glucose-Capillary 240 (*)    All other components within normal limits  GLUCOSE, CAPILLARY - Abnormal; Notable for the following:    Glucose-Capillary 256 (*)    All other components within normal limits  CULTURE, BLOOD (ROUTINE X 2)  CULTURE, BLOOD (ROUTINE X 2)  URINE CULTURE  I-STAT CG4 LACTIC ACID, ED    EKG  EKG Interpretation None       Radiology No results found.  Procedures Procedures (including critical care time)  Medications Ordered in ED Medications  acetaminophen (TYLENOL) tablet 650 mg (not administered)  levothyroxine (SYNTHROID, LEVOTHROID) tablet 25 mcg (25 mcg Oral Given 05/09/17 0753)  insulin detemir (LEVEMIR) injection 25 Units (25 Units Subcutaneous Given  05/09/17 0909)  lipase/protease/amylase (CREON) capsule 36,000 Units (36,000 Units Oral Given 05/09/17 1131)  loratadine (CLARITIN) tablet 10 mg (10 mg Oral Given 05/09/17 0909)  polyethylene glycol (MIRALAX / GLYCOLAX) packet 17 g (not administered)  prochlorperazine (COMPAZINE) tablet 10 mg (not administered)  traMADol (ULTRAM) tablet 50 mg (not administered)  heparin injection 5,000 Units (5,000 Units Subcutaneous Given 05/09/17 0515)  0.9 %  sodium chloride infusion ( Intravenous Rate/Dose Change 05/09/17 1011)  ceFEPIme (MAXIPIME) 2 g in dextrose 5 % 50 mL IVPB (2 g Intravenous New Bag/Given 05/09/17 1131)  traZODone (DESYREL) tablet 25 mg (not administered)  sodium chloride flush (NS) 0.9 % injection 10-40 mL (10 mLs Intracatheter Not Given 05/09/17 1000)  sodium chloride 0.9 % bolus 1,000 mL (0 mLs Intravenous Stopped 05/08/17 2237)  ceFEPIme (MAXIPIME) 2 g in dextrose 5 % 50 mL IVPB (0 g Intravenous Stopped 05/08/17 2058)  acetaminophen (TYLENOL) tablet 1,000 mg (1,000 mg Oral Given 05/08/17 2207)  potassium chloride SA (K-DUR,KLOR-CON) CR tablet 40 mEq (40 mEq Oral Given 05/09/17 0753)     Initial Impression / Assessment and Plan / ED Course  I have reviewed the triage vital signs and the nursing notes.  Pertinent labs & imaging results that were available during my care of the patient were reviewed by me and considered in my medical decision making (see chart for details).     71yo male with history of pancreatic cancer, DM, htn, hlpd, bacteremia, duodenal stent, biliary  drain, began having fevers to 102.7 on Sunday, saw Oncologist and had blood cx which were positive for gram negative rods and presents to the ED at their request. Hemodynamically stable, initially afebrile in ED.  No clear source of bacteremia. Port in place. No localizing symptoms.  Blood cx repeated, pt given cefepime.  Will admit for further care.   Final Clinical Impressions(s) / ED Diagnoses   Final diagnoses:   Bacteremia    New Prescriptions Current Discharge Medication List       Gareth Morgan, MD 05/09/17 1148

## 2017-05-09 ENCOUNTER — Inpatient Hospital Stay (HOSPITAL_COMMUNITY): Payer: Medicare Other

## 2017-05-09 DIAGNOSIS — C250 Malignant neoplasm of head of pancreas: Secondary | ICD-10-CM

## 2017-05-09 DIAGNOSIS — E119 Type 2 diabetes mellitus without complications: Secondary | ICD-10-CM

## 2017-05-09 DIAGNOSIS — A419 Sepsis, unspecified organism: Secondary | ICD-10-CM

## 2017-05-09 DIAGNOSIS — R7881 Bacteremia: Secondary | ICD-10-CM

## 2017-05-09 DIAGNOSIS — K830 Cholangitis: Secondary | ICD-10-CM

## 2017-05-09 DIAGNOSIS — K831 Obstruction of bile duct: Secondary | ICD-10-CM

## 2017-05-09 DIAGNOSIS — C787 Secondary malignant neoplasm of liver and intrahepatic bile duct: Secondary | ICD-10-CM

## 2017-05-09 LAB — CBC WITH DIFFERENTIAL/PLATELET
BASOS ABS: 0 10*3/uL (ref 0.0–0.1)
BASOS PCT: 0 %
EOS PCT: 2 %
Eosinophils Absolute: 0.1 10*3/uL (ref 0.0–0.7)
HCT: 29.1 % — ABNORMAL LOW (ref 39.0–52.0)
Hemoglobin: 10 g/dL — ABNORMAL LOW (ref 13.0–17.0)
Lymphocytes Relative: 14 %
Lymphs Abs: 0.7 10*3/uL (ref 0.7–4.0)
MCH: 30.7 pg (ref 26.0–34.0)
MCHC: 34.4 g/dL (ref 30.0–36.0)
MCV: 89.3 fL (ref 78.0–100.0)
MONO ABS: 0.7 10*3/uL (ref 0.1–1.0)
Monocytes Relative: 14 %
Neutro Abs: 3.4 10*3/uL (ref 1.7–7.7)
Neutrophils Relative %: 71 %
PLATELETS: 93 10*3/uL — AB (ref 150–400)
RBC: 3.26 MIL/uL — AB (ref 4.22–5.81)
RDW: 16.4 % — AB (ref 11.5–15.5)
WBC: 4.8 10*3/uL (ref 4.0–10.5)

## 2017-05-09 LAB — COMPREHENSIVE METABOLIC PANEL
ALBUMIN: 2.5 g/dL — AB (ref 3.5–5.0)
ALK PHOS: 673 U/L — AB (ref 38–126)
ALT: 47 U/L (ref 17–63)
ANION GAP: 8 (ref 5–15)
AST: 48 U/L — AB (ref 15–41)
BILIRUBIN TOTAL: 2.7 mg/dL — AB (ref 0.3–1.2)
BUN: 12 mg/dL (ref 6–20)
CALCIUM: 8.1 mg/dL — AB (ref 8.9–10.3)
CO2: 24 mmol/L (ref 22–32)
Chloride: 103 mmol/L (ref 101–111)
Creatinine, Ser: 0.72 mg/dL (ref 0.61–1.24)
GFR calc Af Amer: >60 mL/min (ref >60)
GLUCOSE: 216 mg/dL — AB (ref 65–99)
POTASSIUM: 3.2 mmol/L — AB (ref 3.5–5.1)
Sodium: 135 mmol/L (ref 135–145)
TOTAL PROTEIN: 5.9 g/dL — AB (ref 6.5–8.1)

## 2017-05-09 LAB — GLUCOSE, CAPILLARY
GLUCOSE-CAPILLARY: 203 mg/dL — AB (ref 65–99)
GLUCOSE-CAPILLARY: 240 mg/dL — AB (ref 65–99)
GLUCOSE-CAPILLARY: 256 mg/dL — AB (ref 65–99)
GLUCOSE-CAPILLARY: 112 mg/dL — AB (ref 65–99)
Glucose-Capillary: 122 mg/dL — ABNORMAL HIGH (ref 65–99)

## 2017-05-09 LAB — CULTURE, BLOOD (SINGLE)

## 2017-05-09 MED ORDER — TRAZODONE HCL 50 MG PO TABS: 25.0000 mg | ORAL_TABLET | Freq: Every evening | ORAL | Status: DC | PRN

## 2017-05-09 MED ORDER — IOPAMIDOL (ISOVUE-300) INJECTION 61%
INTRAVENOUS | Status: AC
  Filled 2017-05-09: qty 30

## 2017-05-09 MED ORDER — IOPAMIDOL (ISOVUE-300) INJECTION 61%
100.0000 mL | Freq: Once | INTRAVENOUS | Status: AC | PRN
  Administered 2017-05-09: 100 mL via INTRAVENOUS

## 2017-05-09 MED ORDER — POTASSIUM CHLORIDE CRYS ER 20 MEQ PO TBCR
40.0000 meq | EXTENDED_RELEASE_TABLET | Freq: Once | ORAL | Status: AC
  Administered 2017-05-09: 40 meq via ORAL
  Filled 2017-05-09: qty 2

## 2017-05-09 MED ORDER — IOPAMIDOL (ISOVUE-300) INJECTION 61%
15.0000 mL | Freq: Two times a day (BID) | INTRAVENOUS | Status: AC | PRN
  Administered 2017-05-09 (×2): 15 mL via ORAL
  Filled 2017-05-09 (×2): qty 30

## 2017-05-09 MED ORDER — INSULIN ASPART 100 UNIT/ML ~~LOC~~ SOLN
0.0000 [IU] | Freq: Three times a day (TID) | SUBCUTANEOUS | Status: DC
  Administered 2017-05-10: 5 [IU] via SUBCUTANEOUS
  Administered 2017-05-10: 1 [IU] via SUBCUTANEOUS
  Administered 2017-05-11: 5 [IU] via SUBCUTANEOUS
  Administered 2017-05-11: 1 [IU] via SUBCUTANEOUS
  Administered 2017-05-12: 2 [IU] via SUBCUTANEOUS

## 2017-05-09 MED ORDER — IOPAMIDOL (ISOVUE-300) INJECTION 61%
INTRAVENOUS | Status: AC
  Filled 2017-05-09: qty 100

## 2017-05-09 MED ORDER — SODIUM CHLORIDE 0.9% FLUSH
10.0000 mL | Freq: Two times a day (BID) | INTRAVENOUS | Status: DC
  Administered 2017-05-10 – 2017-05-12 (×2): 10 mL

## 2017-05-09 NOTE — Care Management Note (Signed)
Case Management Note  Patient Details  Name: Jacob Hardin MRN: [id number] Date of Birth: 02-06-1946  Subjective/Objective: 71 y/o m admitted w/gm neg bacteremia. From home. Readmit.                   Action/Plan:d/c plan home.   Expected Discharge Date:                  Expected Discharge Plan:  Home/Self Care  In-House Referral:     Discharge planning Services  CM Consult  Post Acute Care Choice:    Choice offered to:     DME Arranged:    DME Agency:     HH Arranged:    HH Agency:     Status of Service:  In process, will continue to follow  If discussed at Long Length of Stay Meetings, dates discussed:    Additional Comments:  Dessa Phi, RN 05/09/2017, 3:00 PM

## 2017-05-09 NOTE — Progress Notes (Signed)
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 He tells me the stent drained very well initilly but now there is scant if any flow. He tells me also this is his fourth admission for sepsis related to this problem.  He was being considered for Abraxane treatment but tells me his bilirubinstill too high. He was seen in our office 07/16 with fever and cultures were obtained. He was started on levaquin pending results but when cultures grew klebsiella he was directed to the ED and was admitted yesterday.  The patient's subsequent history is as detailed below.  INTERVAL HISTORY: I met with the patient in his hospital room 05/09/2017; no family present  REVIEW OF SYSTEMS: He tells me he has felt the best he has in months despite the fever problem. He has no abdominal pain, had 2 normal BMs today, has had no diarrhea, cramps, or melena. Has taste alteration and has lost weight. He is hopeful of getting over the current problem and proceeding with further chemotherapy.  PAST MEDICAL HISTORY: Past Medical History:  Diagnosis Date  . Elevated liver enzymes   . GERD (gastroesophageal reflux disease)   . Hyperlipidemia   . Hypertension    recently weight lost-no meds now-running low.  . Obstructive jaundice   . Oxalate nephropathy   . PONV (postoperative nausea and vomiting)   . Primary pancreatic adenocarcinoma (HCC)    mets to liver and duodenum  . Pruritus   . Restless legs   . Skin cancer    "burned off my face & head"  . Type II diabetes mellitus ([place])   .  Weight loss     PAST SURGICAL HISTORY: Past Surgical History:  Procedure Laterality Date  . ANTERIOR CERVICAL DECOMP/DISCECTOMY FUSION  1994   cervical-bone graft  . BACK SURGERY    . COLONOSCOPY    . DUODENAL STENT PLACEMENT N/A 11/22/2015   Procedure: DUODENAL STENT PLACEMENT;  Surgeon: Carol Ada, MD;  Location: [facility];  Service: Endoscopy;  Laterality: N/A;  . DUODENAL STENT PLACEMENT N/A 04/10/2017   Procedure: DUODENAL STENT PLACEMENT;  Surgeon: Carol Ada, MD;  Location: [facility] ENDOSCOPY;  Service: Endoscopy;  Laterality: N/A;  . ERCP N/A 10/26/2016   Procedure: ENDOSCOPIC RETROGRADE CHOLANGIOPANCREATOGRAPHY (ERCP);  Surgeon: Carol Ada, MD;  Location: [facility] ENDOSCOPY;  Service: Endoscopy;  Laterality: N/A;  . ESOPHAGOGASTRODUODENOSCOPY N/A 11/18/2015   Procedure: ESOPHAGOGASTRODUODENOSCOPY (EGD);  Surgeon: Carol Ada, MD;  Location: [facility] ENDOSCOPY;  Service: Endoscopy;  Laterality: N/A;  . ESOPHAGOGASTRODUODENOSCOPY N/A 11/22/2015   Procedure: ESOPHAGOGASTRODUODENOSCOPY (EGD);  Surgeon: Carol Ada, MD;  Location: [facility] ENDOSCOPY;  Service: Endoscopy;  Laterality: N/A;  Uncovered duodenal stent placement.  Fluoroscopy required.  . ESOPHAGOGASTRODUODENOSCOPY N/A 10/14/2016   Procedure: ESOPHAGOGASTRODUODENOSCOPY (EGD);  Surgeon: Carol Ada, MD;  Location: [facility] ENDOSCOPY;  Service: Endoscopy;  Laterality: N/A;  . ESOPHAGOGASTRODUODENOSCOPY (EGD) WITH PROPOFOL N/A 04/10/2017   Procedure: ESOPHAGOGASTRODUODENOSCOPY (EGD) WITH PROPOFOL;  Surgeon: Carol Ada, MD;  Location: [facility] ENDOSCOPY;  Service: Endoscopy;  Laterality: N/A;  . EUS  11/18/2015   Procedure: UPPER ENDOSCOPIC ULTRASOUND (EUS) LINEAR;  Surgeon: Carol Ada, MD;  Location:  [facility] ENDOSCOPY;  Service: Endoscopy;;  . INGUINAL HERNIA REPAIR Left   . IR CHOLANGIOGRAM EXISTING TUBE  04/15/2017  . IR CONVERT BILIARY DRAIN TO INT EXT BILIARY DRAIN  03/20/2017  . IR EXCHANGE BILIARY DRAIN  02/12/2017  . IR EXCHANGE BILIARY DRAIN   03/12/2017  . IR GENERIC HISTORICAL  10/16/2016   IR BILIARY DRAIN PLACEMENT WITH CHOLANGIOGRAM 10/16/2016 Marybelle Killings, MD [facility]  . IR GENERIC HISTORICAL  11/07/2016   IR EXCHANGE BILIARY DRAIN 11/07/2016 Arne Cleveland, MD [facility]  . IR GENERIC HISTORICAL  11/12/2016   IR PATIENT EVAL TECH 0-60 MINS 11/12/2016 Arne Cleveland, MD [facility]  . SHOULDER ARTHROSCOPY W/ ROTATOR CUFF REPAIR Left 2014  . TONSILLECTOMY    . WISDOM TOOTH EXTRACTION      FAMILY HISTORY Family History  Problem Relation Age of Onset  . Diabetes Mother   . Hypertension Mother   . Cancer Father        lung 36  . Hypertension Father   . Cancer Brother        pancreatic   . Diabetes Brother   . Cancer Paternal Grandfather        colon  . Cancer Paternal Uncle        prostate     HEALTH MAINTENANCE: Social History  Substance Use Topics  . Smoking status: Never Smoker  . Smokeless tobacco: Never Used  . Alcohol use No     Allergies  Allergen Reactions  . Codeine Nausea Only    Current Facility-Administered Medications  Medication Dose Route Frequency Provider Last Rate Last Dose  . 0.9 %  sodium chloride infusion   Intravenous Continuous Dessa Phi Chahn-Yang, DO 10 mL/hr at 05/09/17 1011    . acetaminophen (TYLENOL) tablet 650 mg  650 mg Oral Q6H PRN Reubin Milan, MD   650 mg at 05/09/17 1358  . ceFEPIme (MAXIPIME) 2 g in dextrose 5 % 50 mL IVPB  2 g Intravenous Q8H Reubin Milan, MD   Stopped at 05/09/17 1201  . heparin injection 5,000 Units  5,000 Units Subcutaneous Q8H Reubin Milan, MD   5,000 Units at 05/09/17 1357  . insulin aspart (novoLOG) injection 0-9 Units  0-9 Units Subcutaneous TID WC Dessa Phi Chahn-Yang, DO      . insulin detemir (LEVEMIR) injection 25 Units  25 Units Subcutaneous Daily Reubin Milan, MD   25 Units at 05/09/17 [id number]  . iopamidol (ISOVUE-300) 61 % injection           . iopamidol (ISOVUE-300) 61 % injection           .  levothyroxine (SYNTHROID, LEVOTHROID) tablet 25 mcg  25 mcg Oral QAC breakfast Reubin Milan, MD   25 mcg at 05/09/17 0753  . lipase/protease/amylase (CREON) capsule 36,000 Units  36,000 Units Oral TID AC Reubin Milan, MD   36,000 Units at 05/09/17 1735  . loratadine (CLARITIN) tablet 10 mg  10 mg Oral Daily Reubin Milan, MD   10 mg at 05/09/17 3428  . polyethylene glycol (MIRALAX / GLYCOLAX) packet 17 g  17 g Oral Daily PRN Reubin Milan, MD      . prochlorperazine (COMPAZINE) tablet 10 mg  10 mg Oral Q6H PRN Reubin Milan, MD      . sodium chloride flush (NS) 0.9 % injection 10-40 mL  10-40 mL Intracatheter Q12H Reubin Milan, MD      . traMADol Veatrice Bourbon) tablet 50 mg  50 mg Oral Q6H PRN Reubin Milan, MD      . traZODone (DESYREL) tablet 25 mg  25 mg Oral QHS PRN Reubin Milan, MD       Facility-Administered Medications Ordered in Other Encounters  Medication Dose Route Frequency Provider Last Rate Last Dose  . sodium chloride 0.9 % injection 10 mL  10 mL Intravenous PRN Ladell Pier, MD   10 mL at 04/19/17 1032  . sodium chloride flush (NS) 0.9 % injection 10 mL  10 mL Intravenous PRN Owens Shark, NP   10 mL at 01/04/16 0835  . sodium chloride flush (NS) 0.9 % injection 10 mL  10 mL Intravenous PRN Ladell Pier, MD   10 mL at 03/14/16 2836    OBJECTIVE: middle aged White man examined in bed  Vitals:   05/09/17 1338 05/09/17 1445  BP: 133/76   Pulse: 72   Resp: 19   Temp: 99.2 F (37.3 C) 98.7 F (37.1 C)     Body mass index is 20.13 kg/m.   Wt Readings from Last 3 Encounters:  05/08/17 148 lb 6.4 oz (67.3 kg)  04/26/17 141 lb (64 kg)  04/22/17 146 lb 8 oz (66.5 kg)     Ocular: Sclerae unicteric, pupils round and equal Ear-nose-throat: Oropharynx clear, no thrush or other lesions Lungs no rales or rhonchi--auscultated anterolaterally Heart regular rate and rhythm Abd nontender, positive bowel sounds Neuro:  non-focal, well-oriented, appropriate affect    LAB RESULTS:  CMP     Component Value Date/Time   NA 135 05/09/2017 0457   NA 136 05/06/2017 1131   K 3.2 (L) 05/09/2017 0457   K 3.6 05/06/2017 1131   CL 103 05/09/2017 0457   CO2 24 05/09/2017 0457   CO2 25 05/06/2017 1131   GLUCOSE 216 (H) 05/09/2017 0457   GLUCOSE 231 (H) 05/06/2017 1131   BUN 12 05/09/2017 0457   BUN 18.5 05/06/2017 1131   CREATININE 0.72 05/09/2017 0457   CREATININE 1.0 05/06/2017 1131   CALCIUM 8.1 (L) 05/09/2017 0457   CALCIUM 9.0 05/06/2017 1131   PROT 5.9 (L) 05/09/2017 0457   PROT 6.5 05/06/2017 1131   ALBUMIN 2.5 (L) 05/09/2017 0457   ALBUMIN 2.7 (L) 05/06/2017 1131   AST 48 (H) 05/09/2017 0457   AST 60 (H) 05/06/2017 1131   ALT 47 05/09/2017 0457   ALT 59 (H) 05/06/2017 1131   ALKPHOS 673 (H) 05/09/2017 0457   ALKPHOS 496 (H) 05/06/2017 1131   BILITOT 2.7 (H) 05/09/2017 0457   BILITOT 3.50 (H) 05/06/2017 1131   GFRNONAA >60 05/09/2017 0457   GFRNONAA 79 11/03/2015 0839   GFRAA >60 05/09/2017 0457   GFRAA >89 11/03/2015 0839    No results found for: TOTALPROTELP, ALBUMINELP, A1GS, A2GS, BETS, BETA2SER, GAMS, MSPIKE, SPEI  No results found for: KPAFRELGTCHN, LAMBDASER, KAPLAMBRATIO  Lab Results  Component Value Date   WBC 4.8 05/09/2017   NEUTROABS 3.4 05/09/2017   HGB 10.0 (L) 05/09/2017   HCT 29.1 (L) 05/09/2017   MCV 89.3 05/09/2017   PLT 93 (L) 05/09/2017    _0 @  No results found for: LABCA2  No components found for: [id number]  No results for input(s): INR in the last 168 hours.  Urinalysis    Component Value Date/Time   COLORURINE AMBER (A) 05/08/2017 1903   APPEARANCEUR CLEAR 05/08/2017 1903   LABSPEC 1.019 05/08/2017 1903   PHURINE 5.0 05/08/2017 1903   GLUCOSEU 150 (A) 05/08/2017 1903  HGBUR NEGATIVE 05/08/2017 1903   BILIRUBINUR NEGATIVE 05/08/2017 1903   KETONESUR NEGATIVE 05/08/2017 1903   PROTEINUR 30 (A) 05/08/2017 1903   NITRITE NEGATIVE  05/08/2017 1903   LEUKOCYTESUR NEGATIVE 05/08/2017 1903     STUDIES: Dg Abd 1 View  Result Date: 04/14/2017 CLINICAL DATA:  Abdominal discomfort.  pancreatic stent placement EXAM: ABDOMEN - 1 VIEW COMPARISON:  CT 04/12/2017 FINDINGS: Duodenum stent noted. Common bile duct stent noted. Percutaneous biliary stent extends through the common bile duct. Stent in the duodenum. No dilated to large or small bowel. Moderate volume stool throughout the colon. IMPRESSION: 1. Biliary drains and stents appear in proper orientation. Duodenum stent noted. 2. No evidence of bowel obstruction. 3. Moderate volume stool throughout the suggests constipation. Electronically Signed   By: Suzy Bouchard M.D.   On: 04/14/2017 09:52   Dg Abd 1 View - Kub  Result Date: 04/10/2017 CLINICAL DATA:  Duodenum stent. EXAM: ABDOMEN - 1 VIEW COMPARISON:  04/09/2017 FINDINGS: Two duodenum stents noted projected over the proximal duodenum. Stent position appears stable from prior exam. Biliary stent noted. Traversing biliary stent is a biliary drainage catheter. Oral contrast in the colon. No bowel distention. Degenerative changes thoracolumbar spine . IMPRESSION: Two duodenum stents noted projected the proximal duodenum. Biliary stent and biliary drainage tube noted. Oral contrast in the colon. Electronically Signed   By: Marcello Moores  Register   On: 04/10/2017 14:05   Ct Abdomen Pelvis W Contrast  Result Date: 05/09/2017 CLINICAL DATA:  Pt with medical history significant of elevated liver enzymes, GERD, hyperlipidemia, hypertension, obstructive jaundice, oxalate nephropathy, 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 Positive blood cultures with g negative rods drawn 2 days ago. EXAM: CT ABDOMEN AND PELVIS WITH CONTRAST TECHNIQUE: Multidetector CT imaging of the abdomen and pelvis was performed using the standard  protocol following bolus administration of intravenous contrast. CONTRAST:  164m ISOVUE-300 IOPAMIDOL (ISOVUE-300) INJECTION 61% COMPARISON:  CT abdomen dated 04/12/2017. FINDINGS: Lower chest: Lung bases are clear. Hepatobiliary: Diffuse metastases throughout the bilateral liver lobes. Many of the lesions have increased in size compared to the recent CT abdomen of 04/12/2017. Most conspicuous changes seen within the left liver lobe where essentially all of the previously demonstrated lesions in the left liver lobe have enlarged in the interval. There is a new prominent lesion within segment 4B of the left liver lobe which measures 4.3 cm greatest dimension. Again noted is pneumobilia. Percutaneous biliary drain appears stable in position, traversing a common bile duct stent which also appears stable in position. Gallbladder is not distended. Pancreas: Stable appearance of the pancreas, with pancreatic atrophy and dilatation of the main pancreatic duct. Spleen: Splenomegaly, stable. Adrenals/Urinary Tract: Adrenal glands are unremarkable. Kidneys appear normal without mass, stone or hydronephrosis. Bladder appears normal. Stomach/Bowel: No dilated large or small bowel loops. Walls of the right colon appear thickened, most prominent at the level of the cecum. Gastric stent stable in position. Vascular/Lymphatic: Aortic atherosclerosis. No enlarged lymph nodes seen within the abdomen or pelvis. Reproductive: Prostate is unremarkable. Other: Moderate amount of free fluid within the right upper quadrant, right pericolic gutter and pelvis, increased compared to the previous exam. No circumscribed abscess collection appreciated. No free intraperitoneal air seen. Musculoskeletal: No acute or suspicious osseous finding. Mild degenerative change within the lumbar spine. IMPRESSION: 1. Worsening liver metastases, many of the lesions have increased in size compared to the recent CT abdomen  of 04/12/2017, and a new mass is  identified within segment 4B of the left liver lobe which measures 4.3 cm. Alternatively, given the abnormal blood cultures, 1 or more of these liver lesions (including the new dominant lesion within the left liver lobe) could represent liver abscesses but this is considered less likely than worsening liver metastases. 2. Thickening of the walls of the right colon, particularly prominent thickening of the walls of the cecum, suggesting colitis of infectious or inflammatory nature. 3. Moderate amount of free fluid in the right upper quadrant, right pericolic gutter and pelvis, increased compared to the previous CT. 4. Multiple stents and drains, stable in position, as detailed above. 5. Aortic atherosclerosis. Electronically Signed   By: Franki Cabot M.D.   On: 05/09/2017 17:39   Ct Abdomen Pelvis W Contrast  Result Date: 04/12/2017 CLINICAL DATA:  Pancreatic cancer with duodenal invasion. Now with weakness and hypotension. EXAM: CT ABDOMEN AND PELVIS WITH CONTRAST TECHNIQUE: Multidetector CT imaging of the abdomen and pelvis was performed using the standard protocol following bolus administration of intravenous contrast. CONTRAST:  14m ISOVUE-300 IOPAMIDOL (ISOVUE-300) INJECTION 61% COMPARISON:  10/13/2016 FINDINGS: Lower chest:  Compressive atelectasis right lung base. Hepatobiliary: Multiple new and enlarging liver metastases are evident. Liver metastases range in size from about 5 mm up to 1 of the more dominant lesions identified in the inferior right liver measuring 3.9 cm (image 35 series 2). Internal external biliary drain is identified, coursing through the common bile duct stent which appears to be in stable positions since prior study. Pancreas: Pancreatic parenchyma is diffusely atrophic and mild diffuse dilatation of the main pancreatic duct is evident. Fullness in the region the pancreatic head is stable. Interval development of thickening in the region of the pancreatic tail now measuring 2.5 cm  in AP diameter compared to 1.4 cm previously. Spleen: No splenomegaly. No focal mass lesion. Adrenals/Urinary Tract: No adrenal nodule or mass. Kidneys are unremarkable. No hydronephrosis. No evidence for hydroureter. Urinary bladder is moderately distended. Stomach/Bowel: Stomach is nondilated. Duodenal stent again identified with interval placement of a more proximal overlapping stent in the distal stomach. No evidence for small bowel dilatation to suggest obstruction. Terminal ileum unremarkable. Appendix is normal in appearance. Colon is nondilated with contrast material seen extending from the ascending colon through to the level of the rectum. Vascular/Lymphatic: The portal vein and superior mesenteric vein are patent. Splenic vein patency cannot be confirmed. There is abdominal aortic atherosclerosis without aneurysm. No gastrohepatic or hepatoduodenal ligament lymphadenopathy. No retroperitoneal lymphadenopathy No pelvic sidewall lymphadenopathy. Reproductive: The prostate gland and seminal vesicles have normal imaging features. Other: Small volume intraperitoneal free fluid noted. Musculoskeletal: Bone windows reveal no worrisome lytic or sclerotic osseous lesions. IMPRESSION: 1. Interval progression of hepatic metastases. 2. Features suggest interval progression of main pancreatic ductal dilatation with new soft tissue fullness identified in the pancreatic tail region. 3. Pole vein and superior mesenteric vein remain patent. Patency of the splenic vein cannot be confirmed on today's study. 4. Interval placement of an overlapping distal gastric stent proximal to the pre-existing duodenal stent. 5. Interval percutaneous internal external biliary drain placement. No substantial biliary dilatation on today's study. Electronically Signed   By: EMisty StanleyM.D.   On: 04/12/2017 15:44   Dg Chest Port 1 View  Result Date: 04/12/2017 CLINICAL DATA:  Weakness.  Possible sepsis. EXAM: PORTABLE CHEST 1 VIEW  COMPARISON:  02/09/2017 FINDINGS: Right Port-A-Cath which terminates at the low SVC. Right hemidiaphragm elevation is moderate.  Midline trachea. Normal heart size. Atherosclerosis in the transverse aorta. No pleural effusion or pneumothorax. Clear lungs. IMPRESSION: No acute cardiopulmonary disease. Aortic Atherosclerosis (ICD10-I70.0). Electronically Signed   By: Abigail Miyamoto M.D.   On: 04/12/2017 11:51   Ir Cholangiogram Existing Tube  Result Date: 04/16/2017 INDICATION: 71 year old male with a history of pancreatic carcinoma. Admission with sepsis. Resistance with flushing the drain indicating obstructed internal/ external biliary drain EXAM: CHOLANGIOGRAM VIA EXISTING CATHETER EXCHANGE OF INDWELLING INTERNAL/EXTERNAL BILIARY DRAIN MEDICATIONS: None ANESTHESIA/SEDATION: None . FLUOROSCOPY TIME:  Fluoroscopy Time: 1 minutes 6 seconds (18.9 mGy). COMPLICATIONS: None PROCEDURE: Informed written consent was obtained from the patient after a thorough discussion of the procedural risks, benefits and alternatives. All questions were addressed. Maximal Sterile Barrier Technique was utilized including caps, mask, sterile gowns, sterile gloves, sterile drape, hand hygiene and skin antiseptic. A timeout was performed prior to the initiation of the procedure. Patient positioned supine position on the fluoroscopy table. The upper abdomen and the indwelling drain were prepped and draped in the usual sterile fashion. Scout images were acquired. Contrast was infused through the indwelling internal/external biliary drain confirming partial obstruction. The catheter was ligated and a Bentson wire was navigated through the drain with significant resistance, secondary to internal debris. Drain was removed over the wire and a new 12 [place] drain was placed into the duodenum. The loop was formed and the wire was removed. Contrast infusion confirmed adequate location. 1% lidocaine was used for local anesthesia at the skin surface  and retention suture was placed. Drain was placed to gravity drainage. Patient tolerated procedure well and remained hemodynamically stable throughout. No complications were encountered and no significant blood loss. FINDINGS: Infusion of contrast through the indwelling drain demonstrates partial obstruction with only the proximal sideholes patent. Contrast infusion confirms patency of the common bile duct stent, with rapid transit of contrast into the duodenum. New internal/external biliary drain looped in the duodenum. IMPRESSION: Status post routine exchange of 55 French internal/ external transhepatic biliary drain. Signed, Dulcy Fanny. Earleen Newport, DO [facility] Electronically Signed   By: Corrie Mckusick D.O.   On: 04/16/2017 08:18      ASSESSMENT: 71 y.o. [place] man with a history of stage IV pancreatic cancer, with progressive liver metastases, s/p multiple therapies as detailed below, admitted with recurrent cholangitis in the setting of biliary obstruction, s/p stenting   1.Pancreas cancer, stage IV, pancreas head mass, status post an EUS biopsy 11/18/2015 confirming adenocarcinoma  Upper endoscopy 11/18/2015 confirmed duodenal invasion/obstruction with a biopsy confirming adenocarcinoma  Placement of a duodenal stent 11/22/2015  MRI abdomen 11/15/2015 revealed a pancreas head mass and liver metastases  Cycle 1 FOLFIRINOX 12/06/2015  Cycle 2 FOLFIRINOX 12/21/2015  Cycle 3 FOLFIRINOX 01/04/2016  CT abdomen/pelvis 01/19/2016 showed improvement in the pancreatic head mass and liver metastases.  Cycle 4 FOLFIRINOX 02/01/2016  Cycle 5 FOLFIRINOX 02/15/2016  Cycle 6 FOLFIRINOX 02/29/2016  Cycle 7 FOLFIRINOX 03/14/2016  Cycle 8 FOLFIRINOX 03/28/2016  CT abdomen/pelvis 04/09/2016-improvement in pancreas head mass and liver metastases, no new metastatic disease  Chemotherapy switch to FOLFIRI every 3 weeks beginning  04/11/2016  CT 07/30/2016-improvement in liver metastases, stable pancreas mass   FOLFIRI continued on a 3 week schedule  FOLFIRI 09/19/2016.  Treatment subsequently placed on hold due to biliary obstruction  CT 10/13/2016-no change in liver metastases or pancreas mass  FOLFIRI resumed on a 3 week schedule 01/07/2017  FOLFIRI 01/28/2017  FOLFIRI 02/26/2017  FOLFIRI 03/11/2017 (irinotecan dose reduced  secondary to prolonged nausea)  FOLFIRI 04/01/2017  CT 04/12/2017-new and enlarging liver metastases, new soft tissue fullness at the pancreas tail  2. Diabetes  3. History of Anorexia/weight loss secondary to #1  4. Obstructive jaundice secondary to #1-status post placement of a percutaneous internal/external biliary drain 11/25/2015; biliary drainage catheter capped 12/02/2015; biliary drain internalized 12/13/2015  5. Port-A-Cath placement 11/30/2015 interventional radiology  6. Admission 01/17/2016 with a high fever and rigors-no apparent source for infection upon review of his history and examination, blood cultures positive for gram-positive cocci--viridans streptococcus--completed 2 weeks of IV ceftriaxone; no endocarditis on TTE.  7. Oxaliplatin neuropathy  8. Elevated transaminases, alkaline phosphatase, and bilirubin 06/13/2016.06/19/2016 bilirubin improved to normal range; liver enzymes remain elevated.  9. Generalized pruritus,no rash 10/10/2016.Likely secondary to hyperbilirubinemia. Resolved 11/12/2016. Recurrent 12/03/2016.Persistent pruritus.  10. Hospitalization 10/10/2016 through 10/20/2016 with biliary obstruction/sepsis. Status post placement of a biliary drain in interventional radiology 10/16/2016. Status post ERCP 10/26/2016 with biliary stent exchange. There was no bile drainage. Status post cholangiogram and exchange of biliary drain 11/07/2016.Biliary drain capped 11/12/2016.  Exchange of right sided percutaneous biliary  drainage catheter 02/12/2017  Exchange of external biliary drain for internal/external biliary drain 03/20/2017  EGD 04/10/2017-gastric stenosis at the [place], status post placement of a stent  New internal/external biliary drain 04/16/2017  11. 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 C. difficile colitis 04/26/2018status post course of vancomycin.  14. Delayed nausea following chemotherapy-irinotecan dose reduced and prophylactic Decadron added with chemotherapy 03/11/2017; no significant nausea following chemotherapy 03/11/2017.  15. Admission with Klebsiella bacteremia 04/12/2017, readmitted 05/08/2017   PLAN: This is one of multiple admissions for Jacob Hardin for sepsis related to his biliary drains. The CT of the abdomen shows disease progression but no obvious change in the drains. In addition to antibiotic coverage as you are providing it would be appropriate to consult with Dr [name] the patient's GI specialist and with IR to see if drain exchange or replacement might prevent future cholangitis episodes.  The patient is hopeful of improving and proceeding with further chemotherapy. He has discussed this with Dr [name], who however will be out of town until 07/23. I will alert our GI team of Jacob Hardin's admission. And we will follow with you.   Appreciate your help to this patient!   Chauncey Cruel, MD   05/09/2017 7:33 PM Medical Oncology and Hematology [facility] [address] [place], [place] [address] Tel. [phone]    Fax. [phone]

## 2017-05-09 NOTE — Progress Notes (Signed)
PROGRESS NOTE    Jacob Hardin  TZG:[id number] DOB: 11-Sep-1946 DOA: 05/08/2017 PCP: [name], MD     Brief Narrative:  [name] is a 71 y.o. 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   Assessment & Plan:   Principal Problem:   Gram-negative bacteremia Active Problems:   Hyperlipidemia   Hypertension   Primary pancreatic cancer with metastasis to other site [facility])   GERD (gastroesophageal reflux disease)   Obstructive jaundice   DM2 (diabetes mellitus, type 2) (HCC)   Sepsis secondary to gram negative bacteremia -Blood culture from 7/16 showing GNR, pending identification and sensitivity -Repeat blood culture 7/19 pending  -Urine culture pending, UA is unremarkable -Continue cefepime  -Check CT abdomen/pelvis   Obstructive jaundice and gastric outlet obstruction secondary to primary pancreatic cancer with metastasis  -S/p duodenal stent and pancreatic duct stent placed by Dr. [name] on 04/10/2017, afterward developed klebsiella bacteremia which was sensitive to cefepime and patient was discharged home on bactrim  -S/p cholangiogram and exchange of internal/external biliary drain by IR on 04/12/2017 -Follows with Dr. [name]  -Continue creon  Hypokalemia -Replaced   DM type 2 -Levemir 25u qhs and SSI   Hypothyroidism -Continue synthroid   DVT prophylaxis: subq hep Code Status: DNR Family Communication: No family at bedside Disposition Plan: Pending improvement   Consultants:   None  Procedures:    None  Antimicrobials:  Anti-infectives    Start     Dose/Rate Route Frequency Ordered Stop   05/09/17 0400  ceFEPIme (MAXIPIME) 2 g in dextrose 5 % 50 mL IVPB     2 g 100 mL/hr over 30 Minutes Intravenous Every 8 hours 05/08/17 2315     05/08/17 1900  ceFEPIme (MAXIPIME) 2 g in dextrose 5 % 50 mL IVPB     2 g 100 mL/hr over 30 Minutes Intravenous  Once 05/08/17 1849 05/08/17 2058       Subjective: Feeling well this morning. States he has not slept as well as he did last night in weeks. Wants to walk the halls, no abdominal pain or chest pain, breathing is ok, no nausea, vomiting, tolerating meals.    Objective: Vitals:   05/08/17 2149 05/08/17 2300 05/09/17 0229 05/09/17 0510  BP:  138/83  (!) 144/67  Pulse:  77  70  Resp:  18  18  Temp: (!) 101.3 F (38.5 C) 100.2 F (37.9 C) 98 F (36.7 C) 99.1 F (37.3 C)  TempSrc: Oral Oral Oral Oral  SpO2:  99%  100%  Weight:  67.3 kg (148 lb 6.4 oz)    Height:  6' (1.829 m)      Intake/Output Summary (Last 24 hours) at 05/09/17 1217 Last data filed at 05/09/17 0600  Gross per 24 hour  Intake          1873.33 ml  Output              900 ml  Net           973.33 ml   Filed Weights   05/08/17 1700 05/08/17 2300  Weight: 67.6 kg (149 lb) 67.3 kg (  148 lb 6.4 oz)    Examination:  General exam: Appears calm and comfortable, mildly jaundiced Respiratory system: Clear to auscultation. Respiratory effort normal. Cardiovascular system: S1 & S2 heard, RRR. No JVD, murmurs, rubs, gallops or clicks. No pedal edema. Gastrointestinal system: Abdomen is nondistended, soft and nontender. No organomegaly or masses felt. Normal bowel sounds heard. +biliary drain in place with minimal dark fluid  Central nervous system: Alert and oriented. No focal neurological deficits. Extremities: Symmetric 5 x 5 power. Skin: No rashes, lesions or ulcers Psychiatry: Judgement and insight appear normal. Mood & affect appropriate.   Data Reviewed: I  have personally reviewed following labs and imaging studies  CBC:  Recent Labs Lab 05/06/17 1131 05/08/17 2017 05/09/17 0457  WBC 5.4 5.8 4.8  NEUTROABS 4.1 4.4 3.4  HGB 8.6* 7.9* 10.0*  HCT 26.6* 22.7* 29.1*  MCV 92.0 88.3 89.3  PLT 110* 113* 93*   Basic Metabolic Panel:  Recent Labs Lab 05/06/17 1131 05/08/17 2017 05/09/17 0457  NA 136 132* 135  K 3.6 3.5 3.2*  CL  --  98* 103  CO2 25 25 24   GLUCOSE 231* 226* 216*  BUN 18.5 16 12   CREATININE 1.0 0.83 0.72  CALCIUM 9.0 8.2* 8.1*   GFR: Estimated Creatinine Clearance: 83 mL/min (by C-G formula based on SCr of 0.72 mg/dL). Liver Function Tests:  Recent Labs Lab 05/06/17 1131 05/08/17 2017 05/09/17 0457  AST 60* 47* 48*  ALT 59* 48 47  ALKPHOS 496* 622* 673*  BILITOT 3.50* 2.8* 2.7*  PROT 6.5 6.2* 5.9*  ALBUMIN 2.7* 2.6* 2.5*    Recent Labs Lab 05/08/17 2017  LIPASE <10*   No results for input(s): AMMONIA in the last 168 hours. Coagulation Profile: No results for input(s): INR, PROTIME in the last 168 hours. Cardiac Enzymes: No results for input(s): CKTOTAL, CKMB, CKMBINDEX, TROPONINI in the last 168 hours. BNP (last 3 results) No results for input(s): PROBNP in the last 8760 hours. HbA1C: No results for input(s): HGBA1C in the last 72 hours. CBG:  Recent Labs Lab 05/08/17 2328 05/09/17 0227 05/09/17 0754 05/09/17 1138  GLUCAP 189* 203* 240* 256*   Lipid Profile: No results for input(s): CHOL, HDL, LDLCALC, TRIG, CHOLHDL, LDLDIRECT in the last 72 hours. Thyroid Function Tests: No results for input(s): TSH, T4TOTAL, FREET4, T3FREE, THYROIDAB in the last 72 hours. Anemia Panel: No results for input(s): VITAMINB12, FOLATE, FERRITIN, TIBC, IRON, RETICCTPCT in the last 72 hours. Sepsis Labs:  Recent Labs Lab 05/08/17 2025  LATICACIDVEN 1.23    Recent Results (from the past 240 hour(s))  Culture, Blood     Status: Abnormal (Preliminary result)   Collection Time: 05/06/17  1:57 PM   Result Value Ref Range Status   BLOOD CULTURE, ROUTINE Preliminary report (A)  Preliminary   Organism ID, Bacteria Gram negative rods (A)  Preliminary    Comment: Recovered from aerobic and anaerobic bottles.  Culture, Blood     Status: Abnormal (Preliminary result)   Collection Time: 05/06/17  1:59 PM  Result Value Ref Range Status   BLOOD CULTURE, ROUTINE Preliminary report (A)  Preliminary   Organism ID, Bacteria Gram negative rods (A)  Preliminary    Comment: Recovered from aerobic and anaerobic bottles.       Radiology Studies: No results found.    Scheduled Meds: . heparin  5,000 Units Subcutaneous Q8H  . insulin aspart  0-9 Units Subcutaneous TID WC  . insulin detemir  25 Units Subcutaneous Daily  . levothyroxine  25 mcg Oral QAC breakfast  . lipase/protease/amylase  36,000 Units Oral TID AC  . loratadine  10 mg Oral Daily  . sodium chloride flush  10-40 mL Intracatheter Q12H   Continuous Infusions: . sodium chloride 10 mL/hr at 05/09/17 1011  . ceFEPime (MAXIPIME) IV 2 g (05/09/17 1131)     LOS: 1 day    Time spent: 40 minutes   Dessa Phi, DO [facility] [email or web address] Password [email or web address] 05/09/2017, 12:17 PM

## 2017-05-10 ENCOUNTER — Telehealth: Payer: Self-pay | Admitting: Nurse Practitioner

## 2017-05-10 ENCOUNTER — Ambulatory Visit: Payer: Medicare Other | Admitting: Nurse Practitioner

## 2017-05-10 ENCOUNTER — Encounter (HOSPITAL_COMMUNITY): Payer: Self-pay | Admitting: Interventional Radiology

## 2017-05-10 ENCOUNTER — Inpatient Hospital Stay (HOSPITAL_COMMUNITY): Payer: Medicare Other

## 2017-05-10 ENCOUNTER — Other Ambulatory Visit: Payer: Medicare Other

## 2017-05-10 HISTORY — PX: IR EXCHANGE BILIARY DRAIN: IMG6046

## 2017-05-10 LAB — COMPREHENSIVE METABOLIC PANEL
ALBUMIN: 2.2 g/dL — AB (ref 3.5–5.0)
ALK PHOS: 675 U/L — AB (ref 38–126)
ALT: 47 U/L (ref 17–63)
ANION GAP: 8 (ref 5–15)
AST: 53 U/L — ABNORMAL HIGH (ref 15–41)
BILIRUBIN TOTAL: 2.4 mg/dL — AB (ref 0.3–1.2)
BUN: 10 mg/dL (ref 6–20)
CALCIUM: 8.1 mg/dL — AB (ref 8.9–10.3)
CO2: 25 mmol/L (ref 22–32)
CREATININE: 0.72 mg/dL (ref 0.61–1.24)
Chloride: 101 mmol/L (ref 101–111)
GFR calc Af Amer: >60 mL/min (ref >60)
GFR calc non Af Amer: >60 mL/min (ref >60)
GLUCOSE: 122 mg/dL — AB (ref 65–99)
Potassium: 3.6 mmol/L (ref 3.5–5.1)
Sodium: 134 mmol/L — ABNORMAL LOW (ref 135–145)
TOTAL PROTEIN: 5.6 g/dL — AB (ref 6.5–8.1)

## 2017-05-10 LAB — CBC WITH DIFFERENTIAL/PLATELET
BASOS ABS: 0 10*3/uL (ref 0.0–0.1)
BASOS PCT: 0 %
EOS ABS: 0.1 10*3/uL (ref 0.0–0.7)
EOS PCT: 2 %
HCT: 22.1 % — ABNORMAL LOW (ref 39.0–52.0)
Hemoglobin: 7.6 g/dL — ABNORMAL LOW (ref 13.0–17.0)
Lymphocytes Relative: 13 %
Lymphs Abs: 0.7 10*3/uL (ref 0.7–4.0)
MCH: 30.6 pg (ref 26.0–34.0)
MCHC: 34.4 g/dL (ref 30.0–36.0)
MCV: 89.1 fL (ref 78.0–100.0)
MONO ABS: 0.7 10*3/uL (ref 0.1–1.0)
MONOS PCT: 12 %
Neutro Abs: 3.8 10*3/uL (ref 1.7–7.7)
Neutrophils Relative %: 73 %
PLATELETS: 105 10*3/uL — AB (ref 150–400)
RBC: 2.48 MIL/uL — ABNORMAL LOW (ref 4.22–5.81)
RDW: 16.5 % — AB (ref 11.5–15.5)
WBC: 5.3 10*3/uL (ref 4.0–10.5)

## 2017-05-10 LAB — GLUCOSE, CAPILLARY
GLUCOSE-CAPILLARY: 209 mg/dL — AB (ref 65–99)
GLUCOSE-CAPILLARY: 249 mg/dL — AB (ref 65–99)
Glucose-Capillary: 130 mg/dL — ABNORMAL HIGH (ref 65–99)
Glucose-Capillary: 143 mg/dL — ABNORMAL HIGH (ref 65–99)
Glucose-Capillary: 271 mg/dL — ABNORMAL HIGH (ref 65–99)

## 2017-05-10 LAB — URINE CULTURE: CULTURE: NO GROWTH

## 2017-05-10 MED ORDER — ONDANSETRON HCL 4 MG/2ML IJ SOLN
4.0000 mg | Freq: Once | INTRAMUSCULAR | Status: AC
  Administered 2017-05-10: 4 mg via INTRAVENOUS

## 2017-05-10 MED ORDER — ONDANSETRON HCL 4 MG/2ML IJ SOLN
INTRAMUSCULAR | Status: AC
  Administered 2017-05-10: 4 mg via INTRAVENOUS
  Filled 2017-05-10: qty 2

## 2017-05-10 MED ORDER — IOPAMIDOL (ISOVUE-300) INJECTION 61%
INTRAVENOUS | Status: AC
  Administered 2017-05-10: 15 mL
  Filled 2017-05-10: qty 50

## 2017-05-10 MED ORDER — LIDOCAINE HCL 1 % IJ SOLN
INTRAMUSCULAR | Status: AC
  Filled 2017-05-10: qty 20

## 2017-05-10 MED ORDER — IOPAMIDOL (ISOVUE-300) INJECTION 61%
50.0000 mL | Freq: Once | INTRAVENOUS | Status: AC | PRN
  Administered 2017-05-10: 15 mL

## 2017-05-10 MED ORDER — LIDOCAINE HCL 1 % IJ SOLN
INTRAMUSCULAR | Status: AC | PRN
  Administered 2017-05-10: 5 mL via INTRADERMAL

## 2017-05-10 MED ORDER — SODIUM CHLORIDE 0.9% FLUSH
5.0000 mL | Freq: Three times a day (TID) | INTRAVENOUS | Status: DC
  Administered 2017-05-11 (×2): 5 mL via INTRAVENOUS

## 2017-05-10 NOTE — Telephone Encounter (Signed)
Jacob Hardin called and said they patient is in the hospital and will not make his appointments today 8/20

## 2017-05-10 NOTE — Progress Notes (Signed)
MEDICATION-RELATED CONSULT NOTE   IR Procedure Consult - Anticoagulant/Antiplatelet PTA/Inpatient Med List Review by Pharmacist    Procedure: exchange of int/ext biliary drain    Completed: 7/20 at 6773  Post-Procedural bleeding risk per IR MD assessment:  low  Antithrombotic medications on inpatient or PTA profile prior to procedure:   SQ heparin 5000 units q8    Recommended restart time per IR Post-Procedure Guidelines:  Day 0 at least 4 hours after procedure time or at next standard dosing interval   Other considerations:      Plan:    Continue SQ Heparin as already ordered   Adrian Saran, PharmD, BCPS Pager [phone] 05/10/2017 8:58 PM

## 2017-05-10 NOTE — Progress Notes (Signed)
PROGRESS NOTE    Jacob Hardin  QIO:[id number] DOB: July 24, 1946 DOA: 05/08/2017 PCP: [name], MD     Brief Narrative:  Jacob Hardin is a 71 y.o. 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 Blood culture has resulted in klebsiella.   Assessment & Plan:   Principal Problem:   Gram-negative bacteremia Active Problems:   Hyperlipidemia   Hypertension   Primary pancreatic cancer with metastasis to other site [facility])   GERD (gastroesophageal reflux disease)   Obstructive jaundice   DM2 (diabetes mellitus, type 2) (HCC)   Sepsis secondary to klebsiella pneumonia  -Blood culture from 7/16 showing klebsiella pneumonia, sensitive to all but ampicillin  -Repeat blood culture 7/18 negative to date  -Urine culture negative, UA is unremarkable -CT abdomen/pelvis was repeated which shows worsening liver metastases, increased in size, with new mass left liver lobe, possible liver abscess but less likely in setting of worsening liver mets.  -Continue cefepime, considering chronic oral suppressive antibiotic as this is his 4th episode of GNR bacteremia  -Spoke with Dr. [name] over the phone 7/20. Dr. [name] is very familiar with patient. This has been a difficult case with multiple interventions as above. Unfortunately there is not much more GI can offer patient to decrease recurrence of bacteremia.  -IR consulted today to evaluate possible further interventions to see if we can prevent future cholangitis episodes   Obstructive jaundice and gastric outlet  obstruction secondary to primary pancreatic cancer with metastasis  -S/p biliary drain in interventional radiology 10/16/2016. S/p ERCP 10/26/2016 with biliary stent exchange. S/p cholangiogram and exchange of biliary drain 11/07/2016.Biliary drain capped 11/12/2016. Exchange of right sided percutaneous biliary drainage catheter 02/12/2017. Exchange of external biliary drain for internal/external biliary drain 03/20/2017. S/p duodenal stent and pancreatic duct stent placed by Dr. [name] on 04/10/2017. S/p cholangiogram and exchange of internal/external biliary drain by IR on 04/12/2017 -Follows with Dr. [name], appreciate oncology consultation -Continue creon  DM type 2 -Levemir 25u qhs and SSI   Hypothyroidism -Continue synthroid   DVT prophylaxis: subq hep Code Status: DNR Family Communication: wife at bedside Disposition Plan: Pending improvement   Consultants:   Oncology  GI Dr. [name] over the phone only  IR   Procedures:   None  Antimicrobials:  Anti-infectives    Start     Dose/Rate Route Frequency Ordered Stop   05/09/17 0400  ceFEPIme (MAXIPIME) 2 g in dextrose 5 % 50 mL IVPB     2 g 100 mL/hr over 30 Minutes Intravenous Every 8 hours 05/08/17 2315     05/08/17 1900  ceFEPIme (MAXIPIME) 2 g in dextrose 5 % 50 mL IVPB     2 g 100 mL/hr over 30 Minutes Intravenous  Once 05/08/17 1849 05/08/17 2058       Subjective: Feeling well this morning. States this is the best he has felt in a while. No abdominal pain, no nausea, vomiting, tolerating meals and ambulating in the hallway. No new complaints.   Objective: Vitals:   05/09/17 1445 05/09/17 1935 05/09/17 2146 05/10/17 0429  BP:   120/69 134/72  Pulse:   73 71  Resp:   20 18  Temp: 98.7 F (37.1 C) 98.9 F (37.2 C) 99.1 F (37.3 C) 99.7 F (37.6 C)  TempSrc: Oral Oral Oral Oral  SpO2:   99% 98%  Weight:      Height:        Intake/Output Summary (Last 24 hours) at 05/10/17 1014 Last data filed at  05/10/17 0700  Gross per 24 hour  Intake           1306.5 ml  Output             1500 ml  Net           -193.5 ml   Filed Weights   05/08/17 1700 05/08/17 2300  Weight: 67.6 kg (149 lb) 67.3 kg (148 lb 6.4 oz)    Examination:  General exam: Appears calm and comfortable, mildly jaundiced Respiratory system: Clear to auscultation. Respiratory effort normal. Cardiovascular system: S1 & S2 heard, RRR. No JVD, murmurs, rubs, gallops or clicks. No pedal edema. Gastrointestinal system: Abdomen is nondistended, soft and nontender. No organomegaly or masses felt. Normal bowel sounds heard. +biliary drain in place with minimal dark fluid  Central nervous system: Alert and oriented. No focal neurological deficits. Extremities: Symmetric 5 x 5 power. Skin: No rashes, lesions or ulcers Psychiatry: Judgement and insight appear normal. Mood & affect appropriate.   Data Reviewed: I have personally reviewed following labs and imaging studies  CBC:  Recent Labs Lab 05/06/17 1131 05/08/17 2017 05/09/17 0457 05/10/17 0415  WBC 5.4 5.8 4.8 5.3  NEUTROABS 4.1 4.4 3.4 3.8  HGB 8.6* 7.9* 10.0* 7.6*  HCT 26.6* 22.7* 29.1* 22.1*  MCV 92.0 88.3 89.3 89.1  PLT 110* 113* 93* 235*   Basic Metabolic Panel:  Recent Labs Lab 05/06/17 1131 05/08/17 2017 05/09/17 0457 05/10/17 0415  NA 136 132* 135 134*  K 3.6 3.5 3.2* 3.6  CL  --  98* 103 101  CO2 25 25 24 25   GLUCOSE 231* 226* 216* 122*  BUN 18.5 16 12 10   CREATININE 1.0 0.83 0.72 0.72  CALCIUM 9.0 8.2* 8.1* 8.1*   GFR: Estimated Creatinine Clearance: 83 mL/min (by C-G formula based on SCr of 0.72 mg/dL). Liver Function Tests:  Recent Labs Lab 05/06/17 1131 05/08/17 2017 05/09/17 0457 05/10/17 0415  AST 60* 47* 48* 53*  ALT 59* 48 47 47  ALKPHOS 496* 622* 673* 675*  BILITOT 3.50* 2.8* 2.7* 2.4*  PROT 6.5 6.2* 5.9* 5.6*  ALBUMIN 2.7* 2.6* 2.5* 2.2*    Recent Labs Lab 05/08/17 2017  LIPASE <10*   No results for input(s):  AMMONIA in the last 168 hours. Coagulation Profile: No results for input(s): INR, PROTIME in the last 168 hours. Cardiac Enzymes: No results for input(s): CKTOTAL, CKMB, CKMBINDEX, TROPONINI in the last 168 hours. BNP (last 3 results) No results for input(s): PROBNP in the last 8760 hours. HbA1C: No results for input(s): HGBA1C in the last 72 hours. CBG:  Recent Labs Lab 05/09/17 1138 05/09/17 1728 05/09/17 2144 05/10/17 0424 05/10/17 0759  GLUCAP 256* 112* 122* 130* 143*   Lipid Profile: No results for input(s): CHOL, HDL, LDLCALC, TRIG, CHOLHDL, LDLDIRECT in the last 72 hours. Thyroid Function Tests: No results for input(s): TSH, T4TOTAL, FREET4, T3FREE, THYROIDAB in the last 72 hours. Anemia Panel: No results for input(s): VITAMINB12, FOLATE, FERRITIN, TIBC, IRON, RETICCTPCT in the last 72 hours. Sepsis Labs:  Recent Labs Lab 05/08/17 2025  LATICACIDVEN 1.23    Recent Results (from the  past 240 hour(s))  Culture, Blood     Status: Abnormal   Collection Time: 05/06/17  1:57 PM  Result Value Ref Range Status   BLOOD CULTURE, ROUTINE Final report (A)  Final   Organism ID, Bacteria Klebsiella pneumoniae (A)  Final    Comment: Recovered from aerobic and anaerobic bottles.   Antimicrobial Susceptibility Comment  Final    Comment:       ** S = Susceptible; I = Intermediate; R = Resistant **                    P = Positive; N = Negative             MICS are expressed in micrograms per mL    Antibiotic                 RSLT#1    RSLT#2    RSLT#3    RSLT#4 Amoxicillin/Clavulanic Acid    S Ampicillin                     R Cefepime                       S Ceftriaxone                    S Cefuroxime                     S Ciprofloxacin                  S Ertapenem                      S Gentamicin                     S Imipenem                       S Levofloxacin                   S Meropenem                      S Piperacillin/Tazobactam        S Tetracycline                    S Tobramycin                     S Trimethoprim/Sulfa             S   Culture, Blood     Status: Abnormal   Collection Time: 05/06/17  1:59 PM  Result Value Ref Range Status   BLOOD CULTURE, ROUTINE Final report (A)  Final   Organism ID, Bacteria Klebsiella pneumoniae (A)  Final    Comment: Recovered from aerobic and anaerobic bottles.   Antimicrobial Susceptibility Comment  Final    Comment:       ** S = Susceptible; I = Intermediate; R = Resistant **                    P = Positive; N = Negative             MICS are expressed in micrograms per mL    Antibiotic                 RSLT#1  RSLT#2    RSLT#3    RSLT#4 Amoxicillin/Clavulanic Acid    S Ampicillin                     R Cefepime                       S Ceftriaxone                    S Cefuroxime                     S Ciprofloxacin                  S Ertapenem                      S Gentamicin                     S Imipenem                       S Levofloxacin                   S Meropenem                      S Piperacillin/Tazobactam        S Tetracycline                   S Tobramycin                     S Trimethoprim/Sulfa             S   Urine culture     Status: None   Collection Time: 05/08/17  7:03 PM  Result Value Ref Range Status   Specimen Description URINE, CLEAN CATCH  Final   Special Requests NONE  Final   Culture   Final    NO GROWTH Performed at [facility], [place] [address], [place], [place] [address]    Report Status 05/10/2017 FINAL  Final  Blood culture (routine x 2)     Status: None (Preliminary result)   Collection Time: 05/08/17  8:17 PM  Result Value Ref Range Status   Specimen Description BLOOD RIGHT CHEST  Final   Special Requests   Final    BOTTLES DRAWN AEROBIC AND ANAEROBIC Blood Culture adequate volume   Culture   Final    NO GROWTH 1 DAY Performed at [facility], [place] [address], [place], [place] [address]    Report Status PENDING  Incomplete  Blood culture  (routine x 2)     Status: None (Preliminary result)   Collection Time: 05/08/17  8:25 PM  Result Value Ref Range Status   Specimen Description BLOOD LEFT ANTECUBITAL  Final   Special Requests   Final    BOTTLES DRAWN AEROBIC AND ANAEROBIC Blood Culture adequate volume   Culture   Final    NO GROWTH 1 DAY Performed at [facility], [place] [address], 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 Positive blood cultures with g negative rods drawn 2 days ago. EXAM: CT ABDOMEN AND PELVIS WITH CONTRAST TECHNIQUE: Multidetector CT imaging of the abdomen and pelvis was performed using the standard protocol following bolus administration of intravenous contrast. CONTRAST:  111mL ISOVUE-300 IOPAMIDOL (ISOVUE-300) INJECTION 61% COMPARISON:  CT abdomen dated 04/12/2017. FINDINGS: Lower chest: Lung bases are clear. Hepatobiliary: Diffuse metastases throughout the bilateral liver lobes. Many of the lesions have increased in size compared to the recent CT abdomen of 04/12/2017. Most conspicuous changes seen within the left liver lobe where essentially all of the previously demonstrated lesions in the left liver lobe have enlarged in the interval. There is a new prominent lesion within segment 4B of the left liver lobe which measures 4.3 cm greatest dimension. Again noted is pneumobilia. Percutaneous biliary drain appears stable in position, traversing a common bile duct stent which also appears stable in position. Gallbladder is not distended. Pancreas: Stable appearance of the pancreas, with  pancreatic atrophy and dilatation of the main pancreatic duct. Spleen: Splenomegaly, stable. Adrenals/Urinary Tract: Adrenal glands are unremarkable. Kidneys appear normal without mass, stone or hydronephrosis. Bladder appears normal. Stomach/Bowel: No dilated large or small bowel loops. Walls of the right colon appear thickened, most prominent at the level of the cecum. Gastric stent stable in position. Vascular/Lymphatic: Aortic atherosclerosis. No enlarged lymph nodes seen within the abdomen or pelvis. Reproductive: Prostate is unremarkable. Other: Moderate amount of free fluid within the right upper quadrant, right pericolic gutter and pelvis, increased compared to the previous exam. No circumscribed abscess collection appreciated. No free intraperitoneal air seen. Musculoskeletal: No acute or suspicious osseous finding. Mild degenerative change within the lumbar spine. IMPRESSION: 1. Worsening liver metastases, many of the lesions have increased in size compared to the recent CT abdomen of 04/12/2017, and a new mass is identified within segment 4B of the left liver lobe which measures 4.3 cm. Alternatively, given the abnormal blood cultures, 1 or more of these liver lesions (including the new dominant lesion within the left liver lobe) could represent liver abscesses but this is considered less likely than worsening liver metastases. 2. Thickening of the walls of the right colon, particularly prominent thickening of the walls of the cecum, suggesting colitis of infectious or inflammatory nature. 3. Moderate amount of free fluid in the right upper quadrant, right pericolic gutter and pelvis, increased compared to the previous CT. 4. Multiple stents and drains, stable in position, as detailed above. 5. Aortic atherosclerosis. Electronically Signed   By: Franki Cabot M.D.   On: 05/09/2017 17:39      Scheduled Meds: . heparin  5,000 Units Subcutaneous Q8H  . insulin aspart  0-9 Units Subcutaneous TID WC  .  insulin detemir  25 Units Subcutaneous Daily  . levothyroxine  25 mcg Oral QAC breakfast  . lipase/protease/amylase  36,000 Units Oral TID AC  . loratadine  10 mg Oral Daily  . sodium chloride flush  10-40 mL Intracatheter Q12H   Continuous Infusions: . sodium chloride 10 mL/hr at 05/09/17 1011  . ceFEPime (MAXIPIME) IV Stopped (05/10/17 0451)     LOS: 2 days    Time spent: 30 minutes   Dessa Phi, DO [facility] [email or web address] Password [email or web address] 05/10/2017, 10:14 AM

## 2017-05-10 NOTE — Procedures (Signed)
Interventional Radiology Procedure Note  Procedure: Exchange of Int/ext biliary drain.  51F drain placed.  To gravity.  Complications: None Recommendations:  - Continue to flush TID.   - Do not submerge  - Routine care   Signed,  Dulcy Fanny. Earleen Newport, DO

## 2017-05-10 NOTE — Progress Notes (Signed)
Patient ID: Jacob Hardin, male   DOB: Jun 30, 1946, 71 y.o.   MRN: [id number]    Referring Physician(s): Dr. 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  He has had a PTC drain placed with internal and external components and subsequent biliary stent placement.  He has had a routine exchange lastly in June.    He is now admitted with Klebsiella bacteremia and suspected cholangitis.  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  He had a CT scan this admission that Dr. Earleen Newport has reviewed.  A request has been made for evaluation of this drain given his recurrent bacteremia and cholangitis.  Allergies: Codeine  Medications: Prior to Admission medications   Medication Sig Start Date End Date Taking? Authorizing Provider  acetaminophen (TYLENOL) 500 MG tablet Take 1,000 mg by mouth every 6 (six) hours as needed.   Yes [provider]  baclofen (LIORESAL) 10 MG tablet Take 5 mg by mouth 3 (three) times daily as needed (for hiccups).   Yes [provider]  ibuprofen (ADVIL,MOTRIN) 200 MG tablet Take 400 mg by mouth every 6 (six) hours as needed.   Yes [provider]  insulin aspart (NOVOLOG FLEXPEN) 100 UNIT/ML FlexPen Inject 4 Units into the skin daily before supper.   Yes [provider]  Insulin Detemir (LEVEMIR FLEXTOUCH) 100 UNIT/ML Pen Inject 25 Units into the skin daily. 04/16/17  Yes Rosita Fire, MD  levofloxacin (LEVAQUIN) 500 MG tablet Take 1 tablet (500 mg total) by mouth daily. 05/06/17  Yes Owens Shark, NP  levothyroxine (SYNTHROID, LEVOTHROID) 25 MCG tablet Take 1 tablet (25 mcg total) by mouth daily before breakfast. 04/17/17  Yes Rosita Fire, MD  lidocaine-prilocaine (EMLA) cream Apply 1  application topically as needed (prior to accessing port). 04/22/17  Yes Ladell Pier, MD  lipase/protease/amylase (CREON) 36000 UNITS CPEP capsule Take 1 capsule (36,000 Units total) by mouth 3 (three) times daily before meals. 02/22/17  Yes Ladell Pier, MD  loratadine (CLARITIN) 10 MG tablet Take 10 mg by mouth daily.    Yes [provider]  Multiple Vitamin (MULTIVITAMIN WITH MINERALS) TABS tablet Take 1 tablet by mouth daily.   Yes [provider]  ondansetron (ZOFRAN) 8 MG tablet Take 1 tablet (8 mg total) by mouth every 8 (eight) hours as needed for nausea or vomiting. 03/05/17  Yes Owens Shark, NP  polyethylene glycol (MIRALAX / GLYCOLAX) packet Take 17 g by mouth daily as needed for mild constipation.    Yes [provider]  prochlorperazine (COMPAZINE) 10 MG tablet Take 1 tablet (10 mg total) by mouth every 6 (six) hours as needed for nausea or vomiting. 03/05/17  Yes Owens Shark, NP  traMADol (ULTRAM) 50 MG tablet Take 1 tablet (50 mg total) by mouth every 6 (six) hours as needed. 02/15/17  Yes Theodis Blaze, MD  BD PEN NEEDLE NANO U/F 32G X 4 MM MISC USE TO INJECT INSULIN TWICE A DAY 11/06/16   [name], MD  ONE TOUCH ULTRA TEST test strip TEST TWICE A DAY 04/22/17   [name], MD    Vital Signs: BP 134/72 (BP Location: Left Arm)   Pulse 71   Temp 99.7 F (37.6 C) (Oral)   Resp 18  Ht 6' (1.829 m)   Wt 148 lb 6.4 oz (67.3 kg)   SpO2 98%   BMI 20.13 kg/m   Physical Exam: Heart: regular rate and rhythm Lungs: CTAB Abd: soft, NT, ND, PTC drain in place and site is c/d/i  Imaging: Ct Abdomen Pelvis W Contrast  Result Date: 05/09/2017 CLINICAL DATA:  Pt with medical history significant of elevated liver enzymes, GERD, hyperlipidemia, hypertension, obstructive jaundice, oxalate nephropathy, 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 Positive blood cultures with g negative rods drawn 2 days ago. EXAM: CT ABDOMEN AND PELVIS WITH CONTRAST TECHNIQUE: Multidetector CT imaging of the abdomen and pelvis was performed using the standard protocol following bolus administration of intravenous contrast. CONTRAST:  ISOVUE-300 IOPAMIDOL (ISOVUE-300) INJECTION 61% COMPARISON:  CT abdomen dated 04/12/2017. FINDINGS: Lower chest: Lung bases are clear. Hepatobiliary: Diffuse metastases throughout the bilateral liver lobes. Many of the lesions have increased in size compared to the recent CT abdomen of 04/12/2017. Most conspicuous changes seen within the left liver lobe where essentially all of the previously demonstrated lesions in the left liver lobe have enlarged in the interval. There is a new prominent lesion within segment 4B of the left liver lobe which measures 4.3 cm greatest dimension. Again noted is pneumobilia. Percutaneous biliary drain appears stable in position, traversing a common bile duct stent which also appears stable in position. Gallbladder is not distended. Pancreas: Stable appearance of the pancreas, with pancreatic atrophy and dilatation of the main pancreatic duct. Spleen: Splenomegaly, stable. Adrenals/Urinary Tract: Adrenal glands are unremarkable. Kidneys appear normal without mass, stone or hydronephrosis. Bladder appears normal. Stomach/Bowel: No dilated large or small bowel loops. Walls of the right colon appear thickened, most prominent at the level of the cecum. Gastric stent stable in position. Vascular/Lymphatic: Aortic atherosclerosis. No enlarged lymph nodes seen within the abdomen or pelvis. Reproductive: Prostate is unremarkable. Other: Moderate amount of free fluid within the right upper quadrant, right pericolic gutter and pelvis, increased compared to the previous exam. No circumscribed abscess collection appreciated. No free intraperitoneal air seen. Musculoskeletal: No acute or suspicious  osseous finding. Mild degenerative change within the lumbar spine. IMPRESSION: 1. Worsening liver metastases, many of the lesions have increased in size compared to the recent CT abdomen of 04/12/2017, and a new mass is identified within segment 4B of the left liver lobe which measures 4.3 cm. Alternatively, given the abnormal blood cultures, 1 or more of these liver lesions (including the new dominant lesion within the left liver lobe) could represent liver abscesses but this is considered less likely than worsening liver metastases. 2. Thickening of the walls of the right colon, particularly prominent thickening of the walls of the cecum, suggesting colitis of infectious or inflammatory nature. 3. Moderate amount of free fluid in the right upper quadrant, right pericolic gutter and pelvis, increased compared to the previous CT. 4. Multiple stents and drains, stable in position, as detailed above. 5. Aortic atherosclerosis. Electronically Signed   By: Bary Richard M.D.   On: 05/09/2017 17:39    Labs:  CBC:  Recent Labs  05/06/17 1131 05/08/17 2017 05/09/17 0457 05/10/17 0415  WBC 5.4 5.8 4.8 5.3  HGB 8.6* 7.9* 10.0* 7.6*  HCT 26.6* 22.7* 29.1* 22.1*  PLT 110* 113* 93* 105*    COAGS:  Recent Labs  10/12/16 2021 11/07/16 0945 02/09/17 0751 03/20/17 1350 04/12/17 2000  INR 0.98 1.01 1.01  1.14 1.20  APTT 30  --   --  33  --     BMP:  Recent Labs  04/15/17 1206  05/06/17 1131 05/08/17 2017 05/09/17 0457 05/10/17 0415  NA 133*  < > 136 132* 135 134*  K 4.3  < > 3.6 3.5 3.2* 3.6  CL 102  --   --  98* 103 101  CO2 24  < > '25 25 24 25  '$ GLUCOSE 163*  < > 231* 226* 216* 122*  BUN 15  < > 18.'5 16 12 10  '$ CALCIUM 8.0*  < > 9.0 8.2* 8.1* 8.1*  CREATININE 0.66  < > 1.0 0.83 0.72 0.72  GFRNONAA >60  --   --  >60 >60 >60  GFRAA >60  --   --  >60 >60 >60  < > = values in this interval not displayed.  LIVER FUNCTION TESTS:  Recent Labs  05/06/17 1131 05/08/17 2017  05/09/17 0457 05/10/17 0415  BILITOT 3.50* 2.8* 2.7* 2.4*  AST 60* 47* 48* 53*  ALT 59* 48 47 47  ALKPHOS 496* 622* 673* 675*  PROT 6.5 6.2* 5.9* 5.6*  ALBUMIN 2.7* 2.6* 2.5* 2.2*    Assessment and Plan: 1. Pancreatic cancer with biliary obstruction  After reviewing his imaging, it is possible that he has some drain malposition and some sludge blocking his stent.  We will bring him down today for a drain injection and possible exchange/upsizing of his drain.    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  This makes him susceptible to infection.  Given he has to have this stent and drain, there is little radiologically we can do to help prevent further episodes of this.  The procedure has been discussed with the patient and he is agreeable to move forward this afternoon.  Electronically Signed: Henreitta Cea 05/10/2017, 2:01 PM   I spent a total of 25 Minutes at the the patient's bedside AND on the patient's hospital floor or unit, greater than 50% of which was counseling/coordinating care for biliary obstruction secondary to pancreatic cancer

## 2017-05-11 LAB — COMPREHENSIVE METABOLIC PANEL
ALBUMIN: 2.1 g/dL — AB (ref 3.5–5.0)
ALK PHOS: 698 U/L — AB (ref 38–126)
ALT: 45 U/L (ref 17–63)
AST: 45 U/L — AB (ref 15–41)
Anion gap: 6 (ref 5–15)
BILIRUBIN TOTAL: 2.6 mg/dL — AB (ref 0.3–1.2)
BUN: 13 mg/dL (ref 6–20)
CALCIUM: 8.3 mg/dL — AB (ref 8.9–10.3)
CO2: 27 mmol/L (ref 22–32)
Chloride: 101 mmol/L (ref 101–111)
Creatinine, Ser: 0.7 mg/dL (ref 0.61–1.24)
GFR calc Af Amer: >60 mL/min (ref >60)
GFR calc non Af Amer: >60 mL/min (ref >60)
GLUCOSE: 132 mg/dL — AB (ref 65–99)
Potassium: 3.8 mmol/L (ref 3.5–5.1)
Sodium: 134 mmol/L — ABNORMAL LOW (ref 135–145)
TOTAL PROTEIN: 5.7 g/dL — AB (ref 6.5–8.1)

## 2017-05-11 LAB — CBC WITH DIFFERENTIAL/PLATELET
BASOS ABS: 0 10*3/uL (ref 0.0–0.1)
BASOS PCT: 0 %
EOS PCT: 2 %
Eosinophils Absolute: 0.1 10*3/uL (ref 0.0–0.7)
HCT: 23 % — ABNORMAL LOW (ref 39.0–52.0)
Hemoglobin: 7.8 g/dL — ABNORMAL LOW (ref 13.0–17.0)
Lymphocytes Relative: 13 %
Lymphs Abs: 0.7 10*3/uL (ref 0.7–4.0)
MCH: 30.2 pg (ref 26.0–34.0)
MCHC: 33.9 g/dL (ref 30.0–36.0)
MCV: 89.1 fL (ref 78.0–100.0)
MONO ABS: 0.5 10*3/uL (ref 0.1–1.0)
Monocytes Relative: 9 %
Neutro Abs: 4.3 10*3/uL (ref 1.7–7.7)
Neutrophils Relative %: 76 %
PLATELETS: 117 10*3/uL — AB (ref 150–400)
RBC: 2.58 MIL/uL — ABNORMAL LOW (ref 4.22–5.81)
RDW: 16.6 % — AB (ref 11.5–15.5)
WBC: 5.7 10*3/uL (ref 4.0–10.5)

## 2017-05-11 LAB — GLUCOSE, CAPILLARY
GLUCOSE-CAPILLARY: 235 mg/dL — AB (ref 65–99)
GLUCOSE-CAPILLARY: 238 mg/dL — AB (ref 65–99)
Glucose-Capillary: 136 mg/dL — ABNORMAL HIGH (ref 65–99)
Glucose-Capillary: 149 mg/dL — ABNORMAL HIGH (ref 65–99)
Glucose-Capillary: 264 mg/dL — ABNORMAL HIGH (ref 65–99)

## 2017-05-11 MED ORDER — SULFAMETHOXAZOLE-TRIMETHOPRIM 800-160 MG PO TABS
1.0000 | ORAL_TABLET | Freq: Two times a day (BID) | ORAL | Status: DC
  Administered 2017-05-12: 1 via ORAL
  Filled 2017-05-11: qty 1

## 2017-05-11 NOTE — Progress Notes (Signed)
PROGRESS NOTE    Jacob Hardin  ION:[id number] DOB: [birth year] DOA: 05/08/2017 PCP: [name], MD     Brief Narrative:  Jacob Hardin is a 71 y.o. 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 Blood culture has resulted in klebsiella.   Assessment & Plan:   Principal Problem:   Bacteremia due to Klebsiella pneumoniae Active Problems:   Hyperlipidemia   Hypertension   Primary pancreatic cancer with metastasis to other site [facility]   GERD (gastroesophageal reflux disease)   Obstructive jaundice   DM2 (diabetes mellitus, type 2) (HCC)   Sepsis secondary to klebsiella pneumonia  -Blood culture from 7/16 showing klebsiella pneumonia, sensitive to all but ampicillin  -Repeat blood culture 7/18 negative to date  -Urine culture negative -CT abdomen/pelvis was repeated which shows worsening liver metastases, increased in size, with new mass left liver lobe, possible liver abscess but less likely in setting of worsening liver mets.  -Spoke with Dr. [name] over the phone 7/20. Dr. [name] is very familiar with patient. This has been a difficult case with multiple interventions as below. Unfortunately there is not much more GI can offer patient to decrease recurrence of bacteremia.  -IR consulted 7/20 and patient underwent exchange of internal/external biliary drain. As his biliary system is in free communication with GI lumen, he remains at risk of biliary contamination and recurrence of infection -Continue cefepime today and plan to transition to oral at  discharge. I called and spoke with Dr. Linus Salmons of infectious disease to discuss antibiotic plan on discharge. If patient is to continue chronic suppressive antibiotic therapy, he is at high risk to develop drug-resistance. If patient's prognosis is very poor < 1 month, we could try chronic suppression with either keflex 500mg  BID or bactrim BID. If however, patient's prognosis longer, he would recommend that we treat patient as needed when he develops fever and infection to avoid drug resistance. He also does not recommend C Diff prophylaxis.   Obstructive jaundice and gastric outlet obstruction secondary to primary pancreatic cancer with metastasis  -S/p biliary drain in interventional radiology 10/16/2016. S/p ERCP 10/26/2016 with biliary stent exchange. S/p cholangiogram and exchange of biliary drain 11/07/2016.Biliary drain capped 11/12/2016. Exchange of right sided percutaneous biliary drainage catheter 02/12/2017. Exchange of external biliary drain for internal/external biliary drain 03/20/2017. S/p duodenal stent and pancreatic duct stent placed by Dr. [name] on 04/10/2017. S/p cholangiogram and exchange of internal/external biliary drain by IR on 04/12/2017. S/p  exchange of internal/external biliary drain by IR 05/10/2017.  -Follows with Dr. [name], appreciate oncology consultation -Continue creon  DM type 2 -Levemir 25u qhs and SSI   Hypothyroidism -Continue synthroid   DVT prophylaxis: subq hep Code Status: DNR Family Communication: wife at bedside Disposition Plan: Pending improvement   Consultants:   Oncology  GI Dr. [name] over the phone only  IR   ID Dr. Linus Salmons over the phone only   Procedures:   S/p exchange of internal/external biliary drain by IR 05/10/2017.   Antimicrobials:  Anti-infectives    Start     Dose/Rate Route Frequency Ordered Stop   05/09/17 0400  ceFEPIme (MAXIPIME) 2  g in dextrose 5 % 50 mL IVPB     2 g 100 mL/hr over 30 Minutes Intravenous Every 8  hours 05/08/17 2315     05/08/17 1900  ceFEPIme (MAXIPIME) 2 g in dextrose 5 % 50 mL IVPB     2 g 100 mL/hr over 30 Minutes Intravenous  Once 05/08/17 1849 05/08/17 2058       Subjective: Feeling well this morning. No complaints and biliary drain seems to be working well   Objective: Vitals:   05/10/17 1451 05/10/17 1919 05/10/17 2140 05/11/17 0445  BP: 133/72  137/78 (!) 153/84  Pulse: 73  71 73  Resp: 18  14 18   Temp: (!) 100.6 F (38.1 C) 98.5 F (36.9 C) 98.8 F (37.1 C) 99.7 F (37.6 C)  TempSrc: Oral Oral Oral Oral  SpO2: 99%  98% 99%  Weight:      Height:        Intake/Output Summary (Last 24 hours) at 05/11/17 0959 Last data filed at 05/11/17 0900  Gross per 24 hour  Intake              240 ml  Output             3360 ml  Net            -3120 ml   Filed Weights   05/08/17 1700 05/08/17 2300  Weight: 67.6 kg (149 lb) 67.3 kg (148 lb 6.4 oz)    Examination:  General exam: Appears calm and comfortable, mildly jaundiced Respiratory system: Clear to auscultation. Respiratory effort normal. Cardiovascular system: S1 & S2 heard, RRR. No JVD, murmurs, rubs, gallops or clicks. No pedal edema. Gastrointestinal system: Abdomen is nondistended, soft and nontender. No organomegaly or masses felt. Normal bowel sounds heard. +biliary drain in place with green fluid  Central nervous system: Alert and oriented. No focal neurological deficits. Extremities: Symmetric 5 x 5 power. Skin: No rashes, lesions or ulcers Psychiatry: Judgement and insight appear normal. Mood & affect appropriate.   Data Reviewed: I have personally reviewed following labs and imaging studies  CBC:  Recent Labs Lab 05/06/17 1131 05/08/17 2017 05/09/17 0457 05/10/17 0415 05/11/17 0328  WBC 5.4 5.8 4.8 5.3 5.7  NEUTROABS 4.1 4.4 3.4 3.8 4.3  HGB 8.6* 7.9* 10.0* 7.6* 7.8*  HCT 26.6* 22.7* 29.1* 22.1* 23.0*  MCV 92.0 88.3 89.3 89.1 89.1  PLT 110* 113* 93* 105* 628*   Basic Metabolic  Panel:  Recent Labs Lab 05/06/17 1131 05/08/17 2017 05/09/17 0457 05/10/17 0415 05/11/17 0328  NA 136 132* 135 134* 134*  K 3.6 3.5 3.2* 3.6 3.8  CL  --  98* 103 101 101  CO2 25 25 24 25 27   GLUCOSE 231* 226* 216* 122* 132*  BUN 18.5 16 12 10 13   CREATININE 1.0 0.83 0.72 0.72 0.70  CALCIUM 9.0 8.2* 8.1* 8.1* 8.3*   GFR: Estimated Creatinine Clearance: 83 mL/min (by C-G formula based on SCr of 0.7 mg/dL). Liver Function Tests:  Recent Labs Lab 05/06/17 1131 05/08/17 2017 05/09/17 0457 05/10/17 0415 05/11/17 0328  AST 60* 47* 48* 53* 45*  ALT 59* 48 47 47 45  ALKPHOS 496* 622* 673* 675* 698*  BILITOT 3.50* 2.8* 2.7* 2.4* 2.6*  PROT 6.5 6.2* 5.9* 5.6* 5.7*  ALBUMIN 2.7* 2.6* 2.5* 2.2* 2.1*    Recent Labs Lab 05/08/17 2017  LIPASE <10*   No results for input(s): AMMONIA in the last 168 hours. Coagulation Profile: No results for input(s):  INR, PROTIME in the last 168 hours. Cardiac Enzymes: No results for input(s): CKTOTAL, CKMB, CKMBINDEX, TROPONINI in the last 168 hours. BNP (last 3 results) No results for input(s): PROBNP in the last 8760 hours. HbA1C: No results for input(s): HGBA1C in the last 72 hours. CBG:  Recent Labs Lab 05/10/17 1229 05/10/17 1733 05/10/17 2146 05/11/17 0251 05/11/17 0806  GLUCAP 271* 209* 249* 136* 149*   Lipid Profile: No results for input(s): CHOL, HDL, LDLCALC, TRIG, CHOLHDL, LDLDIRECT in the last 72 hours. Thyroid Function Tests: No results for input(s): TSH, T4TOTAL, FREET4, T3FREE, THYROIDAB in the last 72 hours. Anemia Panel: No results for input(s): VITAMINB12, FOLATE, FERRITIN, TIBC, IRON, RETICCTPCT in the last 72 hours. Sepsis Labs:  Recent Labs Lab 05/08/17 2025  LATICACIDVEN 1.23    Recent Results (from the past 240 hour(s))  Culture, Blood     Status: Abnormal   Collection Time: 05/06/17  1:57 PM  Result Value Ref Range Status   BLOOD CULTURE, ROUTINE Final report (A)  Final   Organism ID, Bacteria  Klebsiella pneumoniae (A)  Final    Comment: Recovered from aerobic and anaerobic bottles.   Antimicrobial Susceptibility Comment  Final    Comment:       ** S = Susceptible; I = Intermediate; R = Resistant **                    P = Positive; N = Negative             MICS are expressed in micrograms per mL    Antibiotic                 RSLT#1    RSLT#2    RSLT#3    RSLT#4 Amoxicillin/Clavulanic Acid    S Ampicillin                     R Cefepime                       S Ceftriaxone                    S Cefuroxime                     S Ciprofloxacin                  S Ertapenem                      S Gentamicin                     S Imipenem                       S Levofloxacin                   S Meropenem                      S Piperacillin/Tazobactam        S Tetracycline                   S Tobramycin                     S Trimethoprim/Sulfa             S   Culture, Blood  Status: Abnormal   Collection Time: 05/06/17  1:59 PM  Result Value Ref Range Status   BLOOD CULTURE, ROUTINE Final report (A)  Final   Organism ID, Bacteria Klebsiella pneumoniae (A)  Final    Comment: Recovered from aerobic and anaerobic bottles.   Antimicrobial Susceptibility Comment  Final    Comment:       ** S = Susceptible; I = Intermediate; R = Resistant **                    P = Positive; N = Negative             MICS are expressed in micrograms per mL    Antibiotic                 RSLT#1    RSLT#2    RSLT#3    RSLT#4 Amoxicillin/Clavulanic Acid    S Ampicillin                     R Cefepime                       S Ceftriaxone                    S Cefuroxime                     S Ciprofloxacin                  S Ertapenem                      S Gentamicin                     S Imipenem                       S Levofloxacin                   S Meropenem                      S Piperacillin/Tazobactam        S Tetracycline                   S Tobramycin                      S Trimethoprim/Sulfa             S   Urine culture     Status: None   Collection Time: 05/08/17  7:03 PM  Result Value Ref Range Status   Specimen Description URINE, CLEAN CATCH  Final   Special Requests NONE  Final   Culture   Final    NO GROWTH Performed at [facility], [address] [place], [place] [address]    Report Status 05/10/2017 FINAL  Final  Blood culture (routine x 2)     Status: None (Preliminary result)   Collection Time: 05/08/17  8:17 PM  Result Value Ref Range Status   Specimen Description BLOOD RIGHT CHEST  Final   Special Requests   Final    BOTTLES DRAWN AEROBIC AND ANAEROBIC Blood Culture adequate volume   Culture   Final    NO GROWTH 1 DAY Performed at [facility], [place] [address], [place], [place] [address]  Report Status PENDING  Incomplete  Blood culture (routine x 2)     Status: None (Preliminary result)   Collection Time: 05/08/17  8:25 PM  Result Value Ref Range Status   Specimen Description BLOOD LEFT ANTECUBITAL  Final   Special Requests   Final    BOTTLES DRAWN AEROBIC AND ANAEROBIC Blood Culture adequate volume   Culture   Final    NO GROWTH 1 DAY Performed at [facility], [place] [address], [place], [place] [address]    Report Status PENDING  Incomplete       Radiology Studies: Ct Abdomen Pelvis W Contrast  Result Date: 05/09/2017 CLINICAL DATA:  Pt with medical history significant of elevated liver enzymes, GERD, hyperlipidemia, hypertension, obstructive jaundice, oxalate nephropathy, 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 Positive blood cultures with g negative rods drawn 2 days ago. EXAM: CT ABDOMEN AND PELVIS WITH CONTRAST TECHNIQUE: Multidetector CT imaging of the abdomen and pelvis was performed using the standard protocol following bolus administration of intravenous contrast. CONTRAST:   189mL ISOVUE-300 IOPAMIDOL (ISOVUE-300) INJECTION 61% COMPARISON:  CT abdomen dated 04/12/2017. FINDINGS: Lower chest: Lung bases are clear. Hepatobiliary: Diffuse metastases throughout the bilateral liver lobes. Many of the lesions have increased in size compared to the recent CT abdomen of 04/12/2017. Most conspicuous changes seen within the left liver lobe where essentially all of the previously demonstrated lesions in the left liver lobe have enlarged in the interval. There is a new prominent lesion within segment 4B of the left liver lobe which measures 4.3 cm greatest dimension. Again noted is pneumobilia. Percutaneous biliary drain appears stable in position, traversing a common bile duct stent which also appears stable in position. Gallbladder is not distended. Pancreas: Stable appearance of the pancreas, with pancreatic atrophy and dilatation of the main pancreatic duct. Spleen: Splenomegaly, stable. Adrenals/Urinary Tract: Adrenal glands are unremarkable. Kidneys appear normal without mass, stone or hydronephrosis. Bladder appears normal. Stomach/Bowel: No dilated large or small bowel loops. Walls of the right colon appear thickened, most prominent at the level of the cecum. Gastric stent stable in position. Vascular/Lymphatic: Aortic atherosclerosis. No enlarged lymph nodes seen within the abdomen or pelvis. Reproductive: Prostate is unremarkable. Other: Moderate amount of free fluid within the right upper quadrant, right pericolic gutter and pelvis, increased compared to the previous exam. No circumscribed abscess collection appreciated. No free intraperitoneal air seen. Musculoskeletal: No acute or suspicious osseous finding. Mild degenerative change within the lumbar spine. IMPRESSION: 1. Worsening liver metastases, many of the lesions have increased in size compared to the recent CT abdomen of 04/12/2017, and a new mass is identified within segment 4B of the left liver lobe which measures 4.3 cm.  Alternatively, given the abnormal blood cultures, 1 or more of these liver lesions (including the new dominant lesion within the left liver lobe) could represent liver abscesses but this is considered less likely than worsening liver metastases. 2. Thickening of the walls of the right colon, particularly prominent thickening of the walls of the cecum, suggesting colitis of infectious or inflammatory nature. 3. Moderate amount of free fluid in the right upper quadrant, right pericolic gutter and pelvis, increased compared to the previous CT. 4. Multiple stents and drains, stable in position, as detailed above. 5. Aortic atherosclerosis. Electronically Signed   By: Franki Cabot M.D.   On: 05/09/2017 17:39   Ir Exchange Biliary Drain  Result Date:  05/10/2017 INDICATION: 71 year old male with a history of biliary obstruction, with previously placed common bile duct stent and internal/ external biliary drain. He presents with recurrent episode of cholangitis, with possible obstruction of the drain EXAM: IMAGE GUIDED DRAIN INJECTION AND EXCHANGE MEDICATIONS: 4 mg IV Zofran; The antibiotic was administered within an appropriate time frame prior to the initiation of the procedure. ANESTHESIA/SEDATION: None FLUOROSCOPY TIME:  Fluoroscopy Time: 1 minutes 12 seconds (24 mGy). COMPLICATIONS: None PROCEDURE: Informed written consent was obtained from the patient after a thorough discussion of the procedural risks, benefits and alternatives. All questions were addressed. Maximal Sterile Barrier Technique was utilized including caps, mask, sterile gowns, sterile gloves, sterile drape, hand hygiene and skin antiseptic. A timeout was performed prior to the initiation of the procedure. Patient positioned supine position on the IR table. Scout images were acquired. Patient is prepped and draped. Contrast injection confirmed a least partial occlusion of the indwelling 12 French drain which was appropriately positioned across the  stent into the duodenum. 1% lidocaine was used for local anesthesia. Using modified Seldinger technique, a new 14 French drain was placed. Final image was stored. Patient tolerated the procedure well and remained hemodynamically stable throughout. No complications were encountered and no significant blood loss. IMPRESSION: Status post exchange of partially obstructed internal external biliary drain with placement of a new 14 French drain. Signed, Dulcy Fanny. Earleen Newport, DO [facility] Electronically Signed   By: Corrie Mckusick D.O.   On: 05/10/2017 17:26      Scheduled Meds: . heparin  5,000 Units Subcutaneous Q8H  . insulin aspart  0-9 Units Subcutaneous TID WC  . insulin detemir  25 Units Subcutaneous Daily  . levothyroxine  25 mcg Oral QAC breakfast  . lipase/protease/amylase  36,000 Units Oral TID AC  . loratadine  10 mg Oral Daily  . sodium chloride flush  10-40 mL Intracatheter Q12H  . sodium chloride flush  5 mL Intravenous Q8H   Continuous Infusions: . sodium chloride 10 mL/hr at 05/09/17 1011  . ceFEPime (MAXIPIME) IV Stopped (05/11/17 0522)     LOS: 3 days    Time spent: 30 minutes   Dessa Phi, DO [facility] [email or web address] Password [email or web address] 05/11/2017, 9:59 AM

## 2017-05-12 LAB — CBC
HCT: 23.5 % — ABNORMAL LOW (ref 39.0–52.0)
HEMOGLOBIN: 8 g/dL — AB (ref 13.0–17.0)
MCH: 30.4 pg (ref 26.0–34.0)
MCHC: 34 g/dL (ref 30.0–36.0)
MCV: 89.4 fL (ref 78.0–100.0)
Platelets: 130 10*3/uL — ABNORMAL LOW (ref 150–400)
RBC: 2.63 MIL/uL — ABNORMAL LOW (ref 4.22–5.81)
RDW: 16.4 % — AB (ref 11.5–15.5)
WBC: 7.9 10*3/uL (ref 4.0–10.5)

## 2017-05-12 LAB — BASIC METABOLIC PANEL
Anion gap: 8 (ref 5–15)
BUN: 12 mg/dL (ref 6–20)
CHLORIDE: 96 mmol/L — AB (ref 101–111)
CO2: 27 mmol/L (ref 22–32)
CREATININE: 0.68 mg/dL (ref 0.61–1.24)
Calcium: 8.3 mg/dL — ABNORMAL LOW (ref 8.9–10.3)
GFR calc Af Amer: >60 mL/min (ref >60)
GFR calc non Af Amer: >60 mL/min (ref >60)
Glucose, Bld: 172 mg/dL — ABNORMAL HIGH (ref 65–99)
Potassium: 3.9 mmol/L (ref 3.5–5.1)
SODIUM: 131 mmol/L — AB (ref 135–145)

## 2017-05-12 LAB — GLUCOSE, CAPILLARY
GLUCOSE-CAPILLARY: 171 mg/dL — AB (ref 65–99)
Glucose-Capillary: 188 mg/dL — ABNORMAL HIGH (ref 65–99)

## 2017-05-12 MED ORDER — SULFAMETHOXAZOLE-TRIMETHOPRIM 800-160 MG PO TABS
1.0000 | ORAL_TABLET | Freq: Two times a day (BID) | ORAL | 0 refills | Status: AC
Start: 2017-05-12 — End: 2017-05-17

## 2017-05-12 MED ORDER — HEPARIN SOD (PORK) LOCK FLUSH 100 UNIT/ML IV SOLN
500.0000 [IU] | INTRAVENOUS | Status: DC | PRN
  Administered 2017-05-12: 500 [IU]
  Filled 2017-05-12: qty 5

## 2017-05-12 MED ORDER — HEPARIN SOD (PORK) LOCK FLUSH 100 UNIT/ML IV SOLN
500.0000 [IU] | INTRAVENOUS | Status: DC
  Filled 2017-05-12: qty 5

## 2017-05-12 NOTE — Discharge Summary (Signed)
Physician Discharge Summary  Jacob Hardin LHT:[id number] DOB: 1945/10/28 DOA: 05/08/2017  PCP: [name], MD  Admit date: 05/08/2017 Discharge date: 05/12/2017  Admitted From: Home Disposition:  Home  Recommendations for Outpatient Follow-up:  1. Follow up with Dr. [name] per your appointment on 7/23 2. Please obtain BMP/CBC in 1 week  3. 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 Blood culture has resulted in klebsiella. He was started on cefepime  Sepsis secondary to klebsiella pneumonia  -Blood culture from 7/16 showing klebsiella pneumonia, sensitive to all but ampicillin  -Repeat blood culture 7/18 negative to date  -Urine culture negative -CT abdomen/pelvis was repeated which shows worsening liver metastases, increased in size, with new mass left liver lobe, possible liver abscess but less likely in setting of worsening liver mets.  -Spoke with Dr. [name] over the phone 7/20. Dr. [name] is very familiar with patient. This has been a difficult case with multiple interventions as below. Unfortunately there is not much more GI can offer patient to decrease recurrence of bacteremia.  -IR consulted 7/20 and patient underwent exchange of internal/external  biliary drain. As his biliary system is in free communication with GI lumen, he remains at risk of biliary contamination and recurrence of infection -Spoke with Dr. Linus Salmons of infectious disease on 7/21 to discuss antibiotic plan on discharge. If patient is to continue chronic suppressive antibiotic therapy, he is at high risk to develop drug-resistance. If patient's prognosis is very poor (< 1 month), we could try chronic suppression with either keflex 500mg  BID or bactrim BID. If however, patient's prognosis longer, would recommend that we treat patient as needed when he develops fever and infection to avoid drug resistance. He also does not recommend C Diff prophylaxis. Discussed with patient. He agrees to look out for fevers at home and is to call Dr. Gearldine Shown office if he starts to feel ill. Will discharge patient on oral bactrim to complete antibiotic course.   Obstructive jaundice and gastric outlet obstruction secondary to primary pancreatic cancer with metastasis  -S/p biliary drain in interventional radiology 10/16/2016. S/p ERCP 10/26/2016 with biliary stent exchange. S/p cholangiogram and exchange of biliary drain 11/07/2016.Biliary drain capped 11/12/2016. Exchange of right sided percutaneous biliary drainage catheter 02/12/2017. Exchange of external biliary drain for internal/external biliary drain 03/20/2017. S/p duodenal stent and pancreatic duct stent placed by Dr. [name] on 04/10/2017. S/p cholangiogram and exchange of internal/external biliary drain by IR on 04/12/2017. S/p  exchange of internal/external biliary drain by IR 05/10/2017.  -Follows with Dr. [name], appreciate oncology consultation -Continue creon  DM type 2 -Levemir 25u qhs and SSI   Hypothyroidism -Continue synthroid    Discharge Instructions  Discharge Instructions    Call MD for:  difficulty breathing, headache or visual disturbances    Complete by:  As directed    Call MD for:  extreme fatigue  Complete  by:  As directed    Call MD for:  hives    Complete by:  As directed    Call MD for:  persistant dizziness or light-headedness    Complete by:  As directed    Call MD for:  persistant nausea and vomiting    Complete by:  As directed    Call MD for:  severe uncontrolled pain    Complete by:  As directed    Call MD for:  temperature >100.4    Complete by:  As directed    Diet Carb Modified    Complete by:  As directed    Discharge instructions    Complete by:  As directed    You were cared for by a hospitalist during your hospital stay. If you have any questions about your discharge medications or the care you received while you were in the hospital after you are discharged, you can call the unit and asked to speak with the hospitalist on call if the hospitalist that took care of you is not available. Once you are discharged, your primary care physician will handle any further medical issues. Please note that NO REFILLS for any discharge medications will be authorized once you are discharged, as it is imperative that you return to your primary care physician (or establish a relationship with a primary care physician if you do not have one) for your aftercare needs so that they can reassess your need for medications and monitor your lab values.   Increase activity slowly    Complete by:  As directed      Allergies as of 05/12/2017      Reactions   Codeine Nausea Only      Medication List    STOP taking these medications   levofloxacin 500 MG tablet Commonly known as:  LEVAQUIN     TAKE these medications   acetaminophen 500 MG tablet Commonly known as:  TYLENOL Take 1,000 mg by mouth every 6 (six) hours as needed.   baclofen 10 MG tablet Commonly known as:  LIORESAL Take 5 mg by mouth 3 (three) times daily as needed (for hiccups).   BD PEN NEEDLE NANO U/F 32G X 4 MM Misc Generic drug:  Insulin Pen Needle USE TO INJECT INSULIN TWICE A DAY   ibuprofen 200 MG tablet Commonly  known as:  ADVIL,MOTRIN Take 400 mg by mouth every 6 (six) hours as needed.   Insulin Detemir 100 UNIT/ML Pen Commonly known as:  LEVEMIR FLEXTOUCH Inject 25 Units into the skin daily.   levothyroxine 25 MCG tablet Commonly known as:  SYNTHROID, LEVOTHROID Take 1 tablet (25 mcg total) by mouth daily before breakfast.   lidocaine-prilocaine cream Commonly known as:  EMLA Apply 1 application topically as needed (prior to accessing port).   lipase/protease/amylase 36000 UNITS Cpep capsule Commonly known as:  CREON Take 1 capsule (36,000 Units total) by mouth 3 (three) times daily before meals.   loratadine 10 MG tablet Commonly known as:  CLARITIN Take 10 mg by mouth daily.   multivitamin with minerals Tabs tablet Take 1 tablet by mouth daily.   NOVOLOG FLEXPEN 100 UNIT/ML FlexPen Generic drug:  insulin aspart Inject 4 Units into the skin daily before supper.   ondansetron 8 MG tablet Commonly known as:  ZOFRAN Take 1 tablet (8 mg total) by mouth every 8 (eight) hours as needed for nausea or vomiting.   ONE TOUCH ULTRA TEST test strip Generic drug:  glucose blood  TEST TWICE A DAY   polyethylene glycol packet Commonly known as:  MIRALAX / GLYCOLAX Take 17 g by mouth daily as needed for mild constipation.   prochlorperazine 10 MG tablet Commonly known as:  COMPAZINE Take 1 tablet (10 mg total) by mouth every 6 (six) hours as needed for nausea or vomiting.   sulfamethoxazole-trimethoprim 800-160 MG tablet Commonly known as:  BACTRIM DS,SEPTRA DS Take 1 tablet by mouth every 12 (twelve) hours.   traMADol 50 MG tablet Commonly known as:  ULTRAM Take 1 tablet (50 mg total) by mouth every 6 (six) hours as needed.      Follow-up Information    Ladell Pier, MD. Go on 05/13/2017.   Specialty:  Oncology Contact information: [address] [id number] [phone]          Allergies  Allergen Reactions  . Codeine Nausea Only     Consultations:  Oncology  IR    Procedures/Studies: Dg Abd 1 View  Result Date: 04/14/2017 CLINICAL DATA:  Abdominal discomfort.  pancreatic stent placement EXAM: ABDOMEN - 1 VIEW COMPARISON:  CT 04/12/2017 FINDINGS: Duodenum stent noted. Common bile duct stent noted. Percutaneous biliary stent extends through the common bile duct. Stent in the duodenum. No dilated to large or small bowel. Moderate volume stool throughout the colon. IMPRESSION: 1. Biliary drains and stents appear in proper orientation. Duodenum stent noted. 2. No evidence of bowel obstruction. 3. Moderate volume stool throughout the suggests constipation. Electronically Signed   By: Suzy Bouchard M.D.   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 Positive blood cultures with g negative rods drawn 2 days ago. EXAM: CT ABDOMEN AND PELVIS WITH CONTRAST TECHNIQUE: Multidetector CT imaging of the abdomen and pelvis was performed using the standard protocol following bolus administration of intravenous contrast. CONTRAST:  170mL ISOVUE-300 IOPAMIDOL (ISOVUE-300) INJECTION 61% COMPARISON:  CT abdomen dated 04/12/2017. FINDINGS: Lower chest: Lung bases are clear. Hepatobiliary: Diffuse metastases throughout the bilateral liver lobes. Many of the lesions have increased in size compared to the recent CT abdomen of 04/12/2017. Most conspicuous changes seen within the left liver lobe where essentially all of the previously demonstrated lesions in the left liver lobe have enlarged in the interval. There is a new prominent lesion within segment 4B of the left liver lobe which measures 4.3 cm  greatest dimension. Again noted is pneumobilia. Percutaneous biliary drain appears stable in position, traversing a common bile duct stent which also appears stable in position. Gallbladder is not distended. Pancreas: Stable appearance of the pancreas, with pancreatic atrophy and dilatation of the main pancreatic duct. Spleen: Splenomegaly, stable. Adrenals/Urinary Tract: Adrenal glands are unremarkable. Kidneys appear normal without mass, stone or hydronephrosis. Bladder appears normal. Stomach/Bowel: No dilated large or small bowel loops. Walls of the right colon appear thickened, most prominent at the level of the cecum. Gastric stent stable in position. Vascular/Lymphatic: Aortic atherosclerosis. No enlarged lymph nodes seen within the abdomen or pelvis. Reproductive: Prostate is unremarkable. Other: Moderate amount of free fluid within the right upper quadrant, right pericolic gutter and pelvis, increased compared to the previous exam. No circumscribed abscess collection appreciated. No free intraperitoneal air seen. Musculoskeletal: No acute or suspicious osseous finding. Mild  degenerative change within the lumbar spine. IMPRESSION: 1. Worsening liver metastases, many of the lesions have increased in size compared to the recent CT abdomen of 04/12/2017, and a new mass is identified within segment 4B of the left liver lobe which measures 4.3 cm. Alternatively, given the abnormal blood cultures, 1 or more of these liver lesions (including the new dominant lesion within the left liver lobe) could represent liver abscesses but this is considered less likely than worsening liver metastases. 2. Thickening of the walls of the right colon, particularly prominent thickening of the walls of the cecum, suggesting colitis of infectious or inflammatory nature. 3. Moderate amount of free fluid in the right upper quadrant, right pericolic gutter and pelvis, increased compared to the previous CT. 4. Multiple stents and  drains, stable in position, as detailed above. 5. Aortic atherosclerosis. Electronically Signed   By: Franki Cabot M.D.   On: 05/09/2017 17:39   Ct Abdomen Pelvis W Contrast  Result Date: 04/12/2017 CLINICAL DATA:  Pancreatic cancer with duodenal invasion. Now with weakness and hypotension. EXAM: CT ABDOMEN AND PELVIS WITH CONTRAST TECHNIQUE: Multidetector CT imaging of the abdomen and pelvis was performed using the standard protocol following bolus administration of intravenous contrast. CONTRAST:  11mL ISOVUE-300 IOPAMIDOL (ISOVUE-300) INJECTION 61% COMPARISON:  10/13/2016 FINDINGS: Lower chest:  Compressive atelectasis right lung base. Hepatobiliary: Multiple new and enlarging liver metastases are evident. Liver metastases range in size from about 5 mm up to 1 of the more dominant lesions identified in the inferior right liver measuring 3.9 cm (image 35 series 2). Internal external biliary drain is identified, coursing through the common bile duct stent which appears to be in stable positions since prior study. Pancreas: Pancreatic parenchyma is diffusely atrophic and mild diffuse dilatation of the main pancreatic duct is evident. Fullness in the region the pancreatic head is stable. Interval development of thickening in the region of the pancreatic tail now measuring 2.5 cm in AP diameter compared to 1.4 cm previously. Spleen: No splenomegaly. No focal mass lesion. Adrenals/Urinary Tract: No adrenal nodule or mass. Kidneys are unremarkable. No hydronephrosis. No evidence for hydroureter. Urinary bladder is moderately distended. Stomach/Bowel: Stomach is nondilated. Duodenal stent again identified with interval placement of a more proximal overlapping stent in the distal stomach. No evidence for small bowel dilatation to suggest obstruction. Terminal ileum unremarkable. Appendix is normal in appearance. Colon is nondilated with contrast material seen extending from the ascending colon through to the level of  the rectum. Vascular/Lymphatic: The portal vein and superior mesenteric vein are patent. Splenic vein patency cannot be confirmed. There is abdominal aortic atherosclerosis without aneurysm. No gastrohepatic or hepatoduodenal ligament lymphadenopathy. No retroperitoneal lymphadenopathy No pelvic sidewall lymphadenopathy. Reproductive: The prostate gland and seminal vesicles have normal imaging features. Other: Small volume intraperitoneal free fluid noted. Musculoskeletal: Bone windows reveal no worrisome lytic or sclerotic osseous lesions. IMPRESSION: 1. Interval progression of hepatic metastases. 2. Features suggest interval progression of main pancreatic ductal dilatation with new soft tissue fullness identified in the pancreatic tail region. 3. Pole vein and superior mesenteric vein remain patent. Patency of the splenic vein cannot be confirmed on today's study. 4. Interval placement of an overlapping distal gastric stent proximal to the pre-existing duodenal stent. 5. Interval percutaneous internal external biliary drain placement. No substantial biliary dilatation on today's study. Electronically Signed   By: Misty Stanley M.D.   On: 04/12/2017 15:44   Dg Chest Port 1 View  Result Date: 04/12/2017 CLINICAL DATA:  Weakness.  Possible sepsis. EXAM: PORTABLE CHEST 1 VIEW COMPARISON:  02/09/2017 FINDINGS: Right Port-A-Cath which terminates at the low SVC. Right hemidiaphragm elevation is moderate. Midline trachea. Normal heart size. Atherosclerosis in the transverse aorta. No pleural effusion or pneumothorax. Clear lungs. IMPRESSION: No acute cardiopulmonary disease. Aortic Atherosclerosis (ICD10-I70.0). Electronically Signed   By: Abigail Miyamoto M.D.   On: 04/12/2017 11:51   Ir Cholangiogram Existing Tube  Result Date: 04/16/2017 INDICATION: 71 year old male with a history of pancreatic carcinoma. Admission with sepsis. Resistance with flushing the drain indicating obstructed internal/ external biliary  drain EXAM: CHOLANGIOGRAM VIA EXISTING CATHETER EXCHANGE OF INDWELLING INTERNAL/EXTERNAL BILIARY DRAIN MEDICATIONS: None ANESTHESIA/SEDATION: None . FLUOROSCOPY TIME:  Fluoroscopy Time: 1 minutes 6 seconds (18.9 mGy). COMPLICATIONS: None PROCEDURE: Informed written consent was obtained from the patient after a thorough discussion of the procedural risks, benefits and alternatives. All questions were addressed. Maximal Sterile Barrier Technique was utilized including caps, mask, sterile gowns, sterile gloves, sterile drape, hand hygiene and skin antiseptic. A timeout was performed prior to the initiation of the procedure. Patient positioned supine position on the fluoroscopy table. The upper abdomen and the indwelling drain were prepped and draped in the usual sterile fashion. Scout images were acquired. Contrast was infused through the indwelling internal/external biliary drain confirming partial obstruction. The catheter was ligated and a Bentson wire was navigated through the drain with significant resistance, secondary to internal debris. Drain was removed over the wire and a new 12 [place] drain was placed into the duodenum. The loop was formed and the wire was removed. Contrast infusion confirmed adequate location. 1% lidocaine was used for local anesthesia at the skin surface and retention suture was placed. Drain was placed to gravity drainage. Patient tolerated procedure well and remained hemodynamically stable throughout. No complications were encountered and no significant blood loss. FINDINGS: Infusion of contrast through the indwelling drain demonstrates partial obstruction with only the proximal sideholes patent. Contrast infusion confirms patency of the common bile duct stent, with rapid transit of contrast into the duodenum. New internal/external biliary drain looped in the duodenum. IMPRESSION: Status post routine exchange of 47 French internal/ external transhepatic biliary drain. Signed, Dulcy Fanny.  Earleen Newport, DO [facility] Radiology Electronically Signed   By: Corrie Mckusick D.O.   On: 04/16/2017 08:18   Ir Exchange Biliary Drain  Result Date: 05/10/2017 INDICATION: 71 year old male with a history of biliary obstruction, with previously placed common bile duct stent and internal/ external biliary drain. He presents with recurrent episode of cholangitis, with possible obstruction of the drain EXAM: IMAGE GUIDED DRAIN INJECTION AND EXCHANGE MEDICATIONS: 4 mg IV Zofran; The antibiotic was administered within an appropriate time frame prior to the initiation of the procedure. ANESTHESIA/SEDATION: None FLUOROSCOPY TIME:  Fluoroscopy Time: 1 minutes 12 seconds (24 mGy). COMPLICATIONS: None PROCEDURE: Informed written consent was obtained from the patient after a thorough discussion of the procedural risks, benefits and alternatives. All questions were addressed. Maximal Sterile Barrier Technique was utilized including caps, mask, sterile gowns, sterile gloves, sterile drape, hand hygiene and skin antiseptic. A timeout was performed prior to the initiation of the procedure. Patient positioned supine position on the IR table. Scout images were acquired. Patient is prepped and draped. Contrast injection confirmed a least partial occlusion of the indwelling 12 French drain which was appropriately positioned across the stent into the duodenum. 1% lidocaine was used for local anesthesia. Using modified Seldinger technique, a new 14 French drain was placed. Final image was stored. Patient  tolerated the procedure well and remained hemodynamically stable throughout. No complications were encountered and no significant blood loss. IMPRESSION: Status post exchange of partially obstructed internal external biliary drain with placement of a new 14 French drain. Signed, Dulcy Fanny. Earleen Newport, DO [facility] Radiology Electronically Signed    By: Corrie Mckusick D.O.   On: 05/10/2017 17:26       Discharge Exam: Vitals:   05/11/17 1414 05/12/17 0415  BP: 133/74 125/72  Pulse: 81 71  Resp: 18 18  Temp: 98.2 F (36.8 C) 98 F (36.7 C)   Vitals:   05/11/17 0445 05/11/17 1230 05/11/17 1414 05/12/17 0415  BP: (!) 153/84  133/74 125/72  Pulse: 73  81 71  Resp: 18  18 18   Temp: 99.7 F (37.6 C) 97.8 F (36.6 C) 98.2 F (36.8 C) 98 F (36.7 C)  TempSrc: Oral Oral Oral Oral  SpO2: 99%  98% 96%  Weight:      Height:        General: Pt is alert, awake, not in acute distress Cardiovascular: RRR, S1/S2 +, no rubs, no gallops Respiratory: CTA bilaterally, no wheezing, no rhonchi Abdominal: Soft, NT, ND, bowel sounds +, +biliary drain with green fluid  Extremities: no edema, no cyanosis    The results of significant diagnostics from this hospitalization (including imaging, microbiology, ancillary and laboratory) are listed below for reference.     Microbiology: Recent Results (from the past 240 hour(s))  Culture, Blood     Status: Abnormal   Collection Time: 05/06/17  1:57 PM  Result Value Ref Range Status   BLOOD CULTURE, ROUTINE Final report (A)  Final   Organism ID, Bacteria Klebsiella pneumoniae (A)  Final    Comment: Recovered from aerobic and anaerobic bottles.   Antimicrobial Susceptibility Comment  Final    Comment:       ** S = Susceptible; I = Intermediate; R = Resistant **                    P = Positive; N = Negative             MICS are expressed in micrograms per mL    Antibiotic                 RSLT#1    RSLT#2    RSLT#3    RSLT#4 Amoxicillin/Clavulanic Acid    S Ampicillin                     R Cefepime                       S Ceftriaxone                    S Cefuroxime                     S Ciprofloxacin                  S Ertapenem                      S Gentamicin                     S Imipenem                       S Levofloxacin  S Meropenem                       S Piperacillin/Tazobactam        S Tetracycline                   S Tobramycin                     S Trimethoprim/Sulfa             S   Culture, Blood     Status: Abnormal   Collection Time: 05/06/17  1:59 PM  Result Value Ref Range Status   BLOOD CULTURE, ROUTINE Final report (A)  Final   Organism ID, Bacteria Klebsiella pneumoniae (A)  Final    Comment: Recovered from aerobic and anaerobic bottles.   Antimicrobial Susceptibility Comment  Final    Comment:       ** S = Susceptible; I = Intermediate; R = Resistant **                    P = Positive; N = Negative             MICS are expressed in micrograms per mL    Antibiotic                 RSLT#1    RSLT#2    RSLT#3    RSLT#4 Amoxicillin/Clavulanic Acid    S Ampicillin                     R Cefepime                       S Ceftriaxone                    S Cefuroxime                     S Ciprofloxacin                  S Ertapenem                      S Gentamicin                     S Imipenem                       S Levofloxacin                   S Meropenem                      S Piperacillin/Tazobactam        S Tetracycline                   S Tobramycin                     S Trimethoprim/Sulfa             S   Urine culture     Status: None   Collection Time: 05/08/17  7:03 PM  Result Value Ref Range Status   Specimen Description URINE, CLEAN CATCH  Final   Special Requests NONE  Final   Culture   Final    NO GROWTH Performed at [facility], [address] [place], [place] [address]    Report Status 05/10/2017 FINAL  Final  Blood culture (routine x 2)     Status: None (Preliminary result)   Collection Time: 05/08/17  8:17 PM  Result Value Ref Range Status   Specimen Description BLOOD RIGHT CHEST  Final   Special Requests   Final    BOTTLES DRAWN AEROBIC AND ANAEROBIC Blood Culture adequate volume   Culture   Final    NO GROWTH 2 DAYS Performed at [facility], [place] [address] [place], [place]  [address]    Report Status PENDING  Incomplete  Blood culture (routine x 2)     Status: None (Preliminary result)   Collection Time: 05/08/17  8:25 PM  Result Value Ref Range Status   Specimen Description BLOOD LEFT ANTECUBITAL  Final   Special Requests   Final    BOTTLES DRAWN AEROBIC AND ANAEROBIC Blood Culture adequate volume   Culture   Final    NO GROWTH 2 DAYS Performed at [facility], [place] [address] [place], [place] [address]    Report Status PENDING  Incomplete     Labs: BNP (last 3 results) No results for input(s): BNP in the last 8760 hours. Basic Metabolic Panel:  Recent Labs Lab 05/08/17 2017 05/09/17 0457 05/10/17 0415 05/11/17 0328 05/12/17 0547  NA 132* 135 134* 134* 131*  K 3.5 3.2* 3.6 3.8 3.9  CL 98* 103 101 101 96*  CO2 25 24 25 27 27   GLUCOSE 226* 216* 122* 132* 172*  BUN 16 12 10 13 12   CREATININE 0.83 0.72 0.72 0.70 0.68  CALCIUM 8.2* 8.1* 8.1* 8.3* 8.3*   Liver Function Tests:  Recent Labs Lab 05/06/17 1131 05/08/17 2017 05/09/17 0457 05/10/17 0415 05/11/17 0328  AST 60* 47* 48* 53* 45*  ALT 59* 48 47 47 45  ALKPHOS 496* 622* 673* 675* 698*  BILITOT 3.50* 2.8* 2.7* 2.4* 2.6*  PROT 6.5 6.2* 5.9* 5.6* 5.7*  ALBUMIN 2.7* 2.6* 2.5* 2.2* 2.1*    Recent Labs Lab 05/08/17 2017  LIPASE <10*   No results for input(s): AMMONIA in the last 168 hours. CBC:  Recent Labs Lab 05/06/17 1131 05/08/17 2017 05/09/17 0457 05/10/17 0415 05/11/17 0328 05/12/17 0547  WBC 5.4 5.8 4.8 5.3 5.7 7.9  NEUTROABS 4.1 4.4 3.4 3.8 4.3  --   HGB 8.6* 7.9* 10.0* 7.6* 7.8* 8.0*  HCT 26.6* 22.7* 29.1* 22.1* 23.0* 23.5*  MCV 92.0 88.3 89.3 89.1 89.1 89.4  PLT 110* 113* 93* 105* 117* 130*   Cardiac Enzymes: No results for input(s): CKTOTAL, CKMB, CKMBINDEX, TROPONINI in the last 168 hours. BNP: Invalid input(s): POCBNP CBG:  Recent Labs Lab 05/11/17 1220 05/11/17 1630 05/11/17 2123 05/12/17 0412 05/12/17 0747  GLUCAP 264* 235* 238* 171*  188*   D-Dimer No results for input(s): DDIMER in the last 72 hours. Hgb A1c No results for input(s): HGBA1C in the last 72 hours. Lipid Profile No results for input(s): CHOL, HDL, LDLCALC, TRIG, CHOLHDL, LDLDIRECT in the last 72 hours. Thyroid function studies No results for input(s): TSH, T4TOTAL, T3FREE, THYROIDAB in the last 72 hours.  Invalid input(s): FREET3 Anemia work up No results for input(s): VITAMINB12, FOLATE, FERRITIN, TIBC, IRON, RETICCTPCT in the last 72 hours. Urinalysis    Component Value Date/Time   COLORURINE AMBER (A) 05/08/2017 1903   APPEARANCEUR CLEAR 05/08/2017 1903   LABSPEC 1.019 05/08/2017 1903   PHURINE 5.0 05/08/2017 1903   GLUCOSEU 150 (A) 05/08/2017 1903  HGBUR NEGATIVE 05/08/2017 1903   BILIRUBINUR NEGATIVE 05/08/2017 [place] 05/08/2017 1903   PROTEINUR 49 (A) 05/08/2017 1903   NITRITE NEGATIVE 05/08/2017 1903   LEUKOCYTESUR NEGATIVE 05/08/2017 1903   Sepsis Labs Invalid input(s): PROCALCITONIN,  WBC,  LACTICIDVEN Microbiology Recent Results (from the past 240 hour(s))  Culture, Blood     Status: Abnormal   Collection Time: 05/06/17  1:57 PM  Result Value Ref Range Status   BLOOD CULTURE, ROUTINE Final report (A)  Final   Organism ID, Bacteria Klebsiella pneumoniae (A)  Final    Comment: Recovered from aerobic and anaerobic bottles.   Antimicrobial Susceptibility Comment  Final    Comment:       ** S = Susceptible; I = Intermediate; R = Resistant **                    P = Positive; N = Negative             MICS are expressed in micrograms per mL    Antibiotic                 RSLT#1    RSLT#2    RSLT#3    RSLT#4 Amoxicillin/Clavulanic Acid    S Ampicillin                     R Cefepime                       S Ceftriaxone                    S Cefuroxime                     S Ciprofloxacin                  S Ertapenem                      S Gentamicin                     S Imipenem                        S Levofloxacin                   S Meropenem                      S Piperacillin/Tazobactam        S Tetracycline                   S Tobramycin                     S Trimethoprim/Sulfa             S   Culture, Blood     Status: Abnormal   Collection Time: 05/06/17  1:59 PM  Result Value Ref Range Status   BLOOD CULTURE, ROUTINE Final report (A)  Final   Organism ID, Bacteria Klebsiella pneumoniae (A)  Final    Comment: Recovered from aerobic and anaerobic bottles.   Antimicrobial Susceptibility Comment  Final    Comment:       ** S = Susceptible; I = Intermediate; R = Resistant **  P = Positive; N = Negative             MICS are expressed in micrograms per mL    Antibiotic                 RSLT#1    RSLT#2    RSLT#3    RSLT#4 Amoxicillin/Clavulanic Acid    S Ampicillin                     R Cefepime                       S Ceftriaxone                    S Cefuroxime                     S Ciprofloxacin                  S Ertapenem                      S Gentamicin                     S Imipenem                       S Levofloxacin                   S Meropenem                      S Piperacillin/Tazobactam        S Tetracycline                   S Tobramycin                     S Trimethoprim/Sulfa             S   Urine culture     Status: None   Collection Time: 05/08/17  7:03 PM  Result Value Ref Range Status   Specimen Description URINE, CLEAN CATCH  Final   Special Requests NONE  Final   Culture   Final    NO GROWTH Performed at [facility], [place] [address], [place], [place] [address]    Report Status 05/10/2017 FINAL  Final  Blood culture (routine x 2)     Status: None (Preliminary result)   Collection Time: 05/08/17  8:17 PM  Result Value Ref Range Status   Specimen Description BLOOD RIGHT CHEST  Final   Special Requests   Final    BOTTLES DRAWN AEROBIC AND ANAEROBIC Blood Culture adequate volume   Culture   Final    NO GROWTH 2  DAYS Performed at [facility], [place] [address] [place], [place] [address]    Report Status PENDING  Incomplete  Blood culture (routine x 2)     Status: None (Preliminary result)   Collection Time: 05/08/17  8:25 PM  Result Value Ref Range Status   Specimen Description BLOOD LEFT ANTECUBITAL  Final   Special Requests   Final    BOTTLES DRAWN AEROBIC AND ANAEROBIC Blood Culture adequate volume   Culture   Final    NO GROWTH 2 DAYS Performed at [facility], [place] [address] [place], [place] [address]    Report Status  PENDING  Incomplete     Time coordinating discharge: 20 minutes  SIGNED:  Dessa Phi, DO [facility] Pager [phone]  If 7PM-7AM, please contact night-coverage [email or web address] Password [facility] 05/12/2017, 8:48 AM

## 2017-05-12 NOTE — Progress Notes (Signed)
Pt and Pt's wife given discharge  instructions and medication teaching.Pt and Wife verbalized understanding. Packet with discharge instructions with Pt at time of discharge.

## 2017-05-13 ENCOUNTER — Telehealth: Payer: Self-pay | Admitting: Oncology

## 2017-05-13 ENCOUNTER — Ambulatory Visit: Payer: Medicare Other | Admitting: Nurse Practitioner

## 2017-05-13 ENCOUNTER — Other Ambulatory Visit: Payer: Medicare Other

## 2017-05-13 ENCOUNTER — Ambulatory Visit (HOSPITAL_BASED_OUTPATIENT_CLINIC_OR_DEPARTMENT_OTHER): Payer: Medicare Other | Admitting: Oncology

## 2017-05-13 ENCOUNTER — Ambulatory Visit (HOSPITAL_BASED_OUTPATIENT_CLINIC_OR_DEPARTMENT_OTHER): Payer: Medicare Other

## 2017-05-13 ENCOUNTER — Ambulatory Visit: Payer: Medicare Other

## 2017-05-13 VITALS — BP 126/64 | HR 75 | Temp 98.9°F | Resp 17 | Ht 72.0 in | Wt 150.7 lb

## 2017-05-13 DIAGNOSIS — C787 Secondary malignant neoplasm of liver and intrahepatic bile duct: Secondary | ICD-10-CM

## 2017-05-13 DIAGNOSIS — C250 Malignant neoplasm of head of pancreas: Secondary | ICD-10-CM

## 2017-05-13 DIAGNOSIS — Z5111 Encounter for antineoplastic chemotherapy: Secondary | ICD-10-CM

## 2017-05-13 DIAGNOSIS — R7881 Bacteremia: Secondary | ICD-10-CM | POA: Diagnosis not present

## 2017-05-13 DIAGNOSIS — C259 Malignant neoplasm of pancreas, unspecified: Secondary | ICD-10-CM

## 2017-05-13 DIAGNOSIS — D6481 Anemia due to antineoplastic chemotherapy: Secondary | ICD-10-CM | POA: Diagnosis not present

## 2017-05-13 DIAGNOSIS — B961 Klebsiella pneumoniae [K. pneumoniae] as the cause of diseases classified elsewhere: Secondary | ICD-10-CM | POA: Diagnosis not present

## 2017-05-13 DIAGNOSIS — G620 Drug-induced polyneuropathy: Secondary | ICD-10-CM | POA: Diagnosis not present

## 2017-05-13 LAB — COMPREHENSIVE METABOLIC PANEL
ALT: 42 U/L (ref 0–55)
ANION GAP: 8 meq/L (ref 3–11)
AST: 36 U/L — AB (ref 5–34)
Albumin: 2.3 g/dL — ABNORMAL LOW (ref 3.5–5.0)
Alkaline Phosphatase: 635 U/L — ABNORMAL HIGH (ref 40–150)
BILIRUBIN TOTAL: 3.44 mg/dL — AB (ref 0.20–1.20)
BUN: 13.1 mg/dL (ref 7.0–26.0)
CHLORIDE: 95 meq/L — AB (ref 98–109)
CO2: 27 meq/L (ref 22–29)
CREATININE: 1.1 mg/dL (ref 0.7–1.3)
Calcium: 9.1 mg/dL (ref 8.4–10.4)
EGFR: 67 mL/min/{1.73_m2} — ABNORMAL LOW (ref >90)
GLUCOSE: 426 mg/dL — AB (ref 70–140)
Potassium: 5.1 mEq/L (ref 3.5–5.1)
Sodium: 130 mEq/L — ABNORMAL LOW (ref 136–145)
TOTAL PROTEIN: 6.6 g/dL (ref 6.4–8.3)

## 2017-05-13 LAB — CBC WITH DIFFERENTIAL/PLATELET
BASO%: 0.2 % (ref 0.0–2.0)
BASOS ABS: 0 10*3/uL (ref 0.0–0.1)
EOS ABS: 0.1 10*3/uL (ref 0.0–0.5)
EOS%: 1.2 % (ref 0.0–7.0)
HCT: 26.2 % — ABNORMAL LOW (ref 38.4–49.9)
HEMOGLOBIN: 8.5 g/dL — AB (ref 13.0–17.1)
LYMPH%: 6.8 % — AB (ref 14.0–49.0)
MCH: 30 pg (ref 27.2–33.4)
MCHC: 32.4 g/dL (ref 32.0–36.0)
MCV: 92.6 fL (ref 79.3–98.0)
MONO#: 1 10*3/uL — ABNORMAL HIGH (ref 0.1–0.9)
MONO%: 9.4 % (ref 0.0–14.0)
NEUT#: 8.6 10*3/uL — ABNORMAL HIGH (ref 1.5–6.5)
NEUT%: 82.4 % — AB (ref 39.0–75.0)
Platelets: 156 10*3/uL (ref 140–400)
RBC: 2.83 10*6/uL — ABNORMAL LOW (ref 4.20–5.82)
RDW: 16.6 % — ABNORMAL HIGH (ref 11.0–14.6)
WBC: 10.4 10*3/uL — ABNORMAL HIGH (ref 4.0–10.3)
lymph#: 0.7 10*3/uL — ABNORMAL LOW (ref 0.9–3.3)

## 2017-05-13 MED ORDER — SODIUM CHLORIDE 0.9% FLUSH
10.0000 mL | INTRAVENOUS | Status: DC | PRN
  Filled 2017-05-13: qty 10

## 2017-05-13 MED ORDER — SODIUM CHLORIDE 0.9 % IV SOLN
Freq: Once | INTRAVENOUS | Status: AC
  Administered 2017-05-13: 13:00:00 via INTRAVENOUS

## 2017-05-13 MED ORDER — GEMCITABINE HCL CHEMO INJECTION 1 GM/26.3ML
800.0000 mg/m2 | Freq: Once | INTRAVENOUS | Status: AC
  Administered 2017-05-13: 1482 mg via INTRAVENOUS
  Filled 2017-05-13: qty 38.98

## 2017-05-13 MED ORDER — HEPARIN SOD (PORK) LOCK FLUSH 100 UNIT/ML IV SOLN
500.0000 [IU] | Freq: Once | INTRAVENOUS | Status: DC | PRN
  Filled 2017-05-13: qty 5

## 2017-05-13 MED ORDER — PROCHLORPERAZINE MALEATE 10 MG PO TABS
10.0000 mg | ORAL_TABLET | Freq: Once | ORAL | Status: AC
  Administered 2017-05-13: 10 mg via ORAL

## 2017-05-13 MED ORDER — PROCHLORPERAZINE MALEATE 10 MG PO TABS
ORAL_TABLET | ORAL | Status: AC
  Filled 2017-05-13: qty 1

## 2017-05-13 NOTE — Telephone Encounter (Signed)
Gave wife avs report and appointments for July and August.

## 2017-05-13 NOTE — Progress Notes (Signed)
CMET reviewed by Dr. [name]: OK to treat with bilirubin 3.44, have pharmacy review for dose adjustments based on lab. Pt to receive Gemzar only today, Jan, Infusion Charge RN made aware.

## 2017-05-13 NOTE — Progress Notes (Signed)
[place] OFFICE PROGRESS NOTE   Diagnosis: Pancreas cancer  INTERVAL HISTORY:   [name] returns as scheduled. He was admitted 05/08/2017 with recurrent klebsiella bacteremia after blood cultures from 05/06/2017 returned positive.Marland Kitchen He was discharged 05/12/2017 to complete an outpatient course of Bactrim. He did not begin gemcitabine/Abraxane secondary to the fever and hyperbilirubinemia.  He generally feels well. No pain.  Objective:  Vital signs in last 24 hours:  Blood pressure 126/64, pulse 75, temperature 98.9 F (37.2 C), temperature source Oral, resp. rate 17, height 6' (1.829 m), weight 150 lb 11.2 oz (68.4 kg), SpO2 99 %.    HEENT: Mild scleral icterus Resp: Lungs clear bilaterally Cardio: Regular rate and rhythm GI: No hepatomegaly, right upper quadrant biliary drain, nontender Vascular: No leg edema   Portacath/PICC-without erythema  Lab Results:  Lab Results  Component Value Date   WBC 10.4 (H) 05/13/2017   HGB 8.5 (L) 05/13/2017   HCT 26.2 (L) 05/13/2017   MCV 92.6 05/13/2017   PLT 156 05/13/2017   NEUTROABS 8.6 (H) 05/13/2017    CMP     Component Value Date/Time   NA 130 (L) 05/13/2017 1204   K 5.1 05/13/2017 1204   CL 96 (L) 05/12/2017 0547   CO2 27 05/13/2017 1204   GLUCOSE 426 (H) 05/13/2017 1204   BUN 13.1 05/13/2017 1204   CREATININE 1.1 05/13/2017 1204   CALCIUM 9.1 05/13/2017 1204   PROT 6.6 05/13/2017 1204   ALBUMIN 2.3 (L) 05/13/2017 1204   AST 36 (H) 05/13/2017 1204   ALT 42 05/13/2017 1204   ALKPHOS 635 (H) 05/13/2017 1204   BILITOT 3.44 (H) 05/13/2017 1204   GFRNONAA >60 05/12/2017 0547   GFRNONAA 79 11/03/2015 0839   GFRAA >60 05/12/2017 0547   GFRAA >89 11/03/2015 0839      Ct Abdomen Pelvis W Contrast  Result Date: 05/09/2017 CLINICAL DATA:  Pt with medical history significant of elevated liver enzymes, GERD, hyperlipidemia, hypertension, obstructive jaundice, oxalate nephropathy, 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 Positive blood cultures with g negative rods drawn 2 days ago. EXAM: CT ABDOMEN AND PELVIS WITH CONTRAST TECHNIQUE: Multidetector CT imaging of the abdomen and pelvis was performed using the standard protocol following bolus administration of intravenous contrast. CONTRAST:  137mL ISOVUE-300 IOPAMIDOL (ISOVUE-300) INJECTION 61% COMPARISON:  CT abdomen dated 04/12/2017. FINDINGS: Lower chest: Lung bases are clear. Hepatobiliary: Diffuse metastases throughout the bilateral liver lobes. Many of the lesions have increased in size compared to the recent CT abdomen of 04/12/2017. Most conspicuous changes seen within the left liver lobe where essentially all of the previously demonstrated lesions in the left liver lobe have enlarged in the interval. There is a new prominent lesion within segment 4B of the left liver lobe which measures 4.3 cm greatest dimension. Again noted is pneumobilia. Percutaneous biliary drain appears stable in position, traversing a common bile duct stent which also appears stable in position. Gallbladder is not distended. Pancreas: Stable appearance of the pancreas, with pancreatic atrophy and dilatation of the main pancreatic duct. Spleen: Splenomegaly, stable. Adrenals/Urinary Tract: Adrenal glands are unremarkable. Kidneys appear normal without mass, stone or hydronephrosis. Bladder appears normal. Stomach/Bowel: No dilated large or small bowel loops. Walls of the right colon appear thickened, most prominent at the level of the cecum. Gastric stent stable in position. Vascular/Lymphatic: Aortic atherosclerosis. No enlarged lymph nodes seen within the abdomen or pelvis.  Reproductive: Prostate is unremarkable. Other: Moderate amount of free fluid within the right upper quadrant, right pericolic gutter and pelvis, increased compared to the previous exam.  No circumscribed abscess collection appreciated. No free intraperitoneal air seen. Musculoskeletal: No acute or suspicious osseous finding. Mild degenerative change within the lumbar spine. IMPRESSION: 1. Worsening liver metastases, many of the lesions have increased in size compared to the recent CT abdomen of 04/12/2017, and a new mass is identified within segment 4B of the left liver lobe which measures 4.3 cm. Alternatively, given the abnormal blood cultures, 1 or more of these liver lesions (including the new dominant lesion within the left liver lobe) could represent liver abscesses but this is considered less likely than worsening liver metastases. 2. Thickening of the walls of the right colon, particularly prominent thickening of the walls of the cecum, suggesting colitis of infectious or inflammatory nature. 3. Moderate amount of free fluid in the right upper quadrant, right pericolic gutter and pelvis, increased compared to the previous CT. 4. Multiple stents and drains, stable in position, as detailed above. 5. Aortic atherosclerosis. Electronically Signed   By: Franki Cabot M.D.   On: 05/09/2017 17:39   Ir Exchange Biliary Drain  Result Date: 05/10/2017 INDICATION: 71 year old male with a history of biliary obstruction, with previously placed common bile duct stent and internal/ external biliary drain. He presents with recurrent episode of cholangitis, with possible obstruction of the drain EXAM: IMAGE GUIDED DRAIN INJECTION AND EXCHANGE MEDICATIONS: 4 mg IV Zofran; The antibiotic was administered within an appropriate time frame prior to the initiation of the procedure. ANESTHESIA/SEDATION: None FLUOROSCOPY TIME:  Fluoroscopy Time: 1 minutes 12 seconds (24 mGy). COMPLICATIONS: None PROCEDURE: Informed written consent was obtained from the patient after a thorough discussion of the procedural risks, benefits and alternatives. All questions were addressed. Maximal Sterile Barrier Technique was  utilized including caps, mask, sterile gowns, sterile gloves, sterile drape, hand hygiene and skin antiseptic. A timeout was performed prior to the initiation of the procedure. Patient positioned supine position on the IR table. Scout images were acquired. Patient is prepped and draped. Contrast injection confirmed a least partial occlusion of the indwelling 12 French drain which was appropriately positioned across the stent into the duodenum. 1% lidocaine was used for local anesthesia. Using modified Seldinger technique, a new 14 French drain was placed. Final image was stored. Patient tolerated the procedure well and remained hemodynamically stable throughout. No complications were encountered and no significant blood loss. IMPRESSION: Status post exchange of partially obstructed internal external biliary drain with placement of a new 14 French drain. Signed, Dulcy Fanny. Earleen Newport, DO [facility] Electronically Signed   By: Corrie Mckusick D.O.   On: 05/10/2017 17:26    Medications: I have reviewed the patient's current medications.  Assessment/Plan: 1.Pancreas cancer, stage IV, pancreas head mass, status post an EUS biopsy 11/18/2015 confirming adenocarcinoma  Upper endoscopy 11/18/2015 confirmed duodenal invasion/obstruction with a biopsy confirming adenocarcinoma  Placement of a duodenal stent 11/22/2015  MRI abdomen 11/15/2015 revealed a pancreas head mass and liver metastases  Cycle 1 FOLFIRINOX 12/06/2015  Cycle 2 FOLFIRINOX 12/21/2015  Cycle 3 FOLFIRINOX 01/04/2016  CT abdomen/pelvis 01/19/2016 showed improvement in the pancreatic head mass and liver metastases.  Cycle 4 FOLFIRINOX 02/01/2016  Cycle 5 FOLFIRINOX 02/15/2016  Cycle 6 FOLFIRINOX 02/29/2016  Cycle 7 FOLFIRINOX 03/14/2016  Cycle 8 FOLFIRINOX 03/28/2016  CT abdomen/pelvis 04/09/2016-improvement in pancreas head mass and liver metastases, no new metastatic  disease  Chemotherapy switch to FOLFIRI every 3 weeks beginning 04/11/2016  CT 07/30/2016-improvement in liver metastases, stable pancreas mass   FOLFIRI continued on a 3 week schedule  FOLFIRI 09/19/2016.  Treatment subsequently placed on hold due to biliary obstruction  CT 10/13/2016-no change in liver metastases or pancreas mass  FOLFIRI resumed on a 3 week schedule 01/07/2017  FOLFIRI 01/28/2017  FOLFIRI 02/26/2017  FOLFIRI 03/11/2017 (irinotecan dose reduced secondary to prolonged nausea)  FOLFIRI 04/01/2017  CT 04/12/2017-new and enlarging liver metastases, new soft tissue fullness at the pancreas tail  CT 05/09/2017-progressive liver metastases, increased ascites,  2. Diabetes  3. History of Anorexia/weight loss secondary to #1  4. Obstructive jaundice secondary to #1-status post placement of a percutaneous internal/external biliary drain 11/25/2015; biliary drainage catheter capped 12/02/2015; biliary drain internalized 12/13/2015  5. Port-A-Cath placement 11/30/2015 interventional radiology  6. Admission 01/17/2016 with a high fever and rigors-no apparent source for infection upon review of his history and examination, blood cultures positive for gram-positive cocci--viridans streptococcus--completed 2 weeks of IV ceftriaxone; no endocarditis on TTE.  7. Oxaliplatin neuropathy  8. Elevated transaminases, alkaline phosphatase, and bilirubin 06/13/2016.06/19/2016 bilirubin improved to normal range; liver enzymes remain elevated.  9. Generalized pruritus,no rash 10/10/2016.Likely secondary to hyperbilirubinemia. Resolved 11/12/2016. Recurrent 12/03/2016.Persistent pruritus.  10. Hospitalization 10/10/2016 through 10/20/2016 with biliary obstruction/sepsis. Status post placement of a biliary drain in interventional radiology 10/16/2016. Status post ERCP 10/26/2016 with biliary stent exchange. There was no bile drainage. Status post  cholangiogram and exchange of biliary drain 11/07/2016.Biliary drain capped 11/12/2016.  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 C. difficile colitis 04/26/2018status post course of vancomycin.  14. Delayed nausea following chemotherapy-irinotecan dose reduced and prophylactic Decadron added with chemotherapy 03/11/2017; no significant nausea following chemotherapy 03/11/2017.  15. Admission with Klebsiella bacteremia 04/12/2017 and 05/08/2017    Disposition:  Mr. Dilone did not begin gemcitabine/Abraxane as scheduled secondary to recurrent Klebsiella bacteremia. He appears well today and would like to proceed with chemotherapy. Abraxane will be held secondary to hyperbilirubinemia. He will return for day 8 chemotherapy on 05/20/2017.  He will complete the course of Bactrim for the recent Klebsiella bacteremia. He is at risk for recurrent bacteremia in the setting of multiple stents and metastatic pancreas cancer.  He has marked hyperglycemia today. He will receive additional insulin at the [facility] and monitor the blood sugar closely at home.  25 minutes were spent with the patient today. The majority of the time was used for counseling and coordination of care.  Donneta Romberg, MD  05/13/2017  3:35 PM

## 2017-05-15 ENCOUNTER — Telehealth: Payer: Self-pay

## 2017-05-15 LAB — CULTURE, BLOOD (ROUTINE X 2)
Special Requests: ADEQUATE
Special Requests: ADEQUATE

## 2017-05-15 NOTE — Telephone Encounter (Signed)
Pt had Gemzar on Monday. This afternoon he had fever 103. Wife gave him tylenol and advil and fever did not go down. Now temp 103.2. No chill, pt feels fine, no sputum, no diarrhea, no n/v. S/w Dr [name] and gave wife instructions. Continue tylenol and ibuprofen. If fever does not go down over night go to ER. If fever goes down but returns, go to ER. If he develops associated symptoms, go to ER.  Otherwise observation. Keep him hydrated. Wife expressed appreciation of MD input.

## 2017-05-19 ENCOUNTER — Other Ambulatory Visit: Payer: Self-pay | Admitting: Oncology

## 2017-05-20 ENCOUNTER — Other Ambulatory Visit: Payer: Self-pay

## 2017-05-20 ENCOUNTER — Ambulatory Visit (HOSPITAL_BASED_OUTPATIENT_CLINIC_OR_DEPARTMENT_OTHER): Payer: Medicare Other

## 2017-05-20 ENCOUNTER — Other Ambulatory Visit: Payer: Self-pay | Admitting: *Deleted

## 2017-05-20 ENCOUNTER — Other Ambulatory Visit (HOSPITAL_BASED_OUTPATIENT_CLINIC_OR_DEPARTMENT_OTHER): Payer: Medicare Other

## 2017-05-20 ENCOUNTER — Ambulatory Visit (HOSPITAL_COMMUNITY)
Admission: RE | Admit: 2017-05-20 | Discharge: 2017-05-20 | Disposition: A | Discharge status: Home / Self Care | Payer: Medicare Other | Source: Ambulatory Visit | Attending: Oncology | Admitting: Oncology

## 2017-05-20 ENCOUNTER — Ambulatory Visit (HOSPITAL_BASED_OUTPATIENT_CLINIC_OR_DEPARTMENT_OTHER): Payer: Medicare Other | Admitting: Oncology

## 2017-05-20 DIAGNOSIS — D6481 Anemia due to antineoplastic chemotherapy: Secondary | ICD-10-CM

## 2017-05-20 DIAGNOSIS — R509 Fever, unspecified: Secondary | ICD-10-CM | POA: Diagnosis not present

## 2017-05-20 DIAGNOSIS — D649 Anemia, unspecified: Secondary | ICD-10-CM

## 2017-05-20 DIAGNOSIS — C787 Secondary malignant neoplasm of liver and intrahepatic bile duct: Secondary | ICD-10-CM

## 2017-05-20 DIAGNOSIS — C250 Malignant neoplasm of head of pancreas: Secondary | ICD-10-CM

## 2017-05-20 LAB — CBC WITH DIFFERENTIAL/PLATELET
BASO%: 0.1 % (ref 0.0–2.0)
BASOS ABS: 0 10*3/uL (ref 0.0–0.1)
EOS%: 0.1 % (ref 0.0–7.0)
Eosinophils Absolute: 0 10*3/uL (ref 0.0–0.5)
HCT: 21.4 % — ABNORMAL LOW (ref 38.4–49.9)
HEMOGLOBIN: 7 g/dL — AB (ref 13.0–17.1)
LYMPH#: 0.5 10*3/uL — AB (ref 0.9–3.3)
LYMPH%: 7.8 % — ABNORMAL LOW (ref 14.0–49.0)
MCH: 29.7 pg (ref 27.2–33.4)
MCHC: 32.7 g/dL (ref 32.0–36.0)
MCV: 90.7 fL (ref 79.3–98.0)
MONO#: 0.4 10*3/uL (ref 0.1–0.9)
MONO%: 5.9 % (ref 0.0–14.0)
NEUT#: 5.9 10*3/uL (ref 1.5–6.5)
NEUT%: 86.1 % — ABNORMAL HIGH (ref 39.0–75.0)
Platelets: 72 10*3/uL — ABNORMAL LOW (ref 140–400)
RBC: 2.36 10*6/uL — ABNORMAL LOW (ref 4.20–5.82)
RDW: 16.6 % — AB (ref 11.0–14.6)
WBC: 6.8 10*3/uL (ref 4.0–10.3)

## 2017-05-20 LAB — PREPARE RBC (CROSSMATCH)

## 2017-05-20 LAB — COMPREHENSIVE METABOLIC PANEL
ALBUMIN: 2.1 g/dL — AB (ref 3.5–5.0)
ALK PHOS: 712 U/L — AB (ref 40–150)
ALT: 47 U/L (ref 0–55)
AST: 62 U/L — AB (ref 5–34)
Anion Gap: 9 mEq/L (ref 3–11)
BILIRUBIN TOTAL: 3.55 mg/dL — AB (ref 0.20–1.20)
BUN: 16.2 mg/dL (ref 7.0–26.0)
CO2: 23 meq/L (ref 22–29)
CREATININE: 0.8 mg/dL (ref 0.7–1.3)
Calcium: 8.6 mg/dL (ref 8.4–10.4)
Chloride: 97 mEq/L — ABNORMAL LOW (ref 98–109)
EGFR: 89 mL/min/{1.73_m2} — ABNORMAL LOW (ref >90)
Glucose: 161 mg/dl — ABNORMAL HIGH (ref 70–140)
POTASSIUM: 4.1 meq/L (ref 3.5–5.1)
SODIUM: 130 meq/L — AB (ref 136–145)
Total Protein: 6.1 g/dL — ABNORMAL LOW (ref 6.4–8.3)

## 2017-05-20 MED ORDER — LEVOFLOXACIN 500 MG PO TABS: 500.0000 mg | ORAL_TABLET | Freq: Every day | ORAL | 0 refills | Status: DC

## 2017-05-20 MED ORDER — SODIUM CHLORIDE 0.9 % IV SOLN
250.0000 mL | Freq: Once | INTRAVENOUS | Status: AC
Start: 2017-05-20 — End: 2017-05-20
  Administered 2017-05-20: 250 mL via INTRAVENOUS

## 2017-05-20 MED ORDER — SODIUM CHLORIDE 0.9% FLUSH
10.0000 mL | INTRAVENOUS | Status: AC | PRN
  Administered 2017-05-20: 10 mL
  Filled 2017-05-20: qty 10

## 2017-05-20 MED ORDER — HEPARIN SOD (PORK) LOCK FLUSH 100 UNIT/ML IV SOLN
500.0000 [IU] | Freq: Every day | INTRAVENOUS | Status: AC | PRN
  Administered 2017-05-20: 500 [IU]
  Filled 2017-05-20: qty 5

## 2017-05-20 NOTE — Patient Instructions (Signed)

## 2017-05-20 NOTE — Progress Notes (Signed)
Dr. [name] reviewed labs and aware of assessment and per Tanya RN per Dr. [name], no chemo pt to receive 2 units of PRBCs.

## 2017-05-20 NOTE — Progress Notes (Signed)
[facility] OFFICE PROGRESS NOTE   Diagnosis: Pancreas cancer  INTERVAL HISTORY:   Jacob Hardin completed a first cycle of gemcitabine on 05/13/2017. Abraxane was held secondary to hyperbilirubinemia. He returns today for day 8 chemotherapy. He completed a course of Bactrim on 05/14/2017. Beginning on 05/15/2017 he notes intermittent low-grade fever. No chills. The fever has been over 100 several times. He feels "weak ". No pain. No bleeding.   Objective:  Vital signs in last 24 hours:  There were no vitals taken for this visit.    HEENT: Scleral icterus. No thrush Resp: Lungs clear bilaterally Cardio: Regular rate and rhythm GI: No hepatomegaly, right upper quadrant biliary drain with a gauze dressing. Vascular: Trace edema to lower leg and ankle bilaterally   Portacath/PICC-without erythema  Lab Results:  Lab Results  Component Value Date   WBC 6.8 05/20/2017   HGB 7.0 (L) 05/20/2017   HCT 21.4 (L) 05/20/2017   MCV 90.7 05/20/2017   PLT 72 (L) 05/20/2017   NEUTROABS 5.9 05/20/2017    CMP     Component Value Date/Time   NA 130 (L) 05/20/2017 0803   K 4.1 05/20/2017 0803   CL 96 (L) 05/12/2017 0547   CO2 23 05/20/2017 0803   GLUCOSE 161 (H) 05/20/2017 0803   BUN 16.2 05/20/2017 0803   CREATININE 0.8 05/20/2017 0803   CALCIUM 8.6 05/20/2017 0803   PROT 6.1 (L) 05/20/2017 0803   ALBUMIN 2.1 (L) 05/20/2017 0803   AST 62 (H) 05/20/2017 0803   ALT 47 05/20/2017 0803   ALKPHOS 712 (H) 05/20/2017 0803   BILITOT 3.55 (HH) 05/20/2017 0803   GFRNONAA >60 05/12/2017 0547   GFRNONAA 79 11/03/2015 0839   GFRAA >60 05/12/2017 0547   GFRAA >89 11/03/2015 0839     Medications: I have reviewed the patient's current medications.  Assessment/Plan: .Pancreas cancer, stage IV, pancreas head mass, status post an EUS biopsy 11/18/2015 confirming adenocarcinoma  Upper endoscopy 11/18/2015 confirmed duodenal invasion/obstruction with a biopsy confirming  adenocarcinoma  Placement of a duodenal stent 11/22/2015  MRI abdomen 11/15/2015 revealed a pancreas head mass and liver metastases  Cycle 1 FOLFIRINOX 12/06/2015  Cycle 2 FOLFIRINOX 12/21/2015  Cycle 3 FOLFIRINOX 01/04/2016  CT abdomen/pelvis 01/19/2016 showed improvement in the pancreatic head mass and liver metastases.  Cycle 4 FOLFIRINOX 02/01/2016  Cycle 5 FOLFIRINOX 02/15/2016  Cycle 6 FOLFIRINOX 02/29/2016  Cycle 7 FOLFIRINOX 03/14/2016  Cycle 8 FOLFIRINOX 03/28/2016  CT abdomen/pelvis 04/09/2016-improvement in pancreas head mass and liver metastases, no new metastatic disease  Chemotherapy switch to FOLFIRI every 3 weeks beginning 04/11/2016  CT 07/30/2016-improvement in liver metastases, stable pancreas mass   FOLFIRI continued on a 3 week schedule  FOLFIRI 09/19/2016.  Treatment subsequently placed on hold due to biliary obstruction  CT 10/13/2016-no change in liver metastases or pancreas mass  FOLFIRI resumed on a 3 week schedule 01/07/2017  FOLFIRI 01/28/2017  FOLFIRI 02/26/2017  FOLFIRI 03/11/2017 (irinotecan dose reduced secondary to prolonged nausea)  FOLFIRI 04/01/2017  CT 04/12/2017-new and enlarging liver metastases, new soft tissue fullness at the pancreas tail  CT 05/09/2017-progressive liver metastases, increased ascites  Cycle 1 gemcitabine/Abraxane 05/13/2017 (Abraxane held secondary to hyperbilirubinemia)   2. Diabetes  3. History of Anorexia/weight loss secondary to #1  4. Obstructive jaundice secondary to #1-status post placement of a percutaneous internal/external biliary drain 11/25/2015; biliary drainage catheter capped 12/02/2015; biliary drain internalized 12/13/2015  5. Port-A-Cath placement 11/30/2015 interventional radiology  6. Admission 01/17/2016 with a high fever and  rigors-no apparent source for infection upon review of his history and examination, blood cultures positive for gram-positive  cocci--viridans streptococcus--completed 2 weeks of IV ceftriaxone; no endocarditis on TTE.  7. Oxaliplatin neuropathy  8. Elevated transaminases, alkaline phosphatase, and bilirubin 06/13/2016.06/19/2016 bilirubin improved to normal range; liver enzymes remain elevated.  9. Generalized pruritus,no rash 10/10/2016.Likely secondary to hyperbilirubinemia. Resolved 11/12/2016. Recurrent 12/03/2016.Persistent pruritus.  10. Hospitalization 10/10/2016 through 10/20/2016 with biliary obstruction/sepsis. Status post placement of a biliary drain in interventional radiology 10/16/2016. Status post ERCP 10/26/2016 with biliary stent exchange. There was no bile drainage. Status post cholangiogram and exchange of biliary drain 11/07/2016.Biliary drain capped 11/12/2016.  Exchange of right sided percutaneous biliary drainage catheter 02/12/2017  Exchange of external biliary drain for internal/external biliary drain 03/20/2017  EGD 04/10/2017-gastric stenosis at the [facility], status post placement of a stent  New internal/external biliary drain 04/16/2017  Exchange of partially obstructed internal/external biliary drain 05/10/2017  11. 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 C. difficile colitis 04/26/2018status post course of vancomycin.  14. Delayed nausea following chemotherapy-irinotecan dose reduced and prophylactic Decadron added with chemotherapy 03/11/2017; no significant nausea following chemotherapy 03/11/2017.  15. Admission with Klebsiella bacteremia 04/12/2017 and 05/08/2017     Disposition:  Jacob Hardin has persistent hyperbilirubinemia and fever. 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 He will begin Levaquin. I contacted interventional radiology. They reviewed his studies to date and see no option for an additional drainage  catheter. The hyperbilirubinemia could be in part related to the metastatic tumor burden in the liver or toxicities from treatment.  Chemotherapy will be placed on hold today. He has developed severe anemia. The anemia is likely secondary to chronic disease and chemotherapy. He will be transfused with 2 units of packed red blood cells today.  Mr. Rosenboom will return for an office visit in one week.  25 minutes were spent with the patient today. The majority of the time was used for counseling and coordination of care.  05/20/2017  4:59 PM

## 2017-05-21 LAB — TYPE AND SCREEN
ABO/RH(D): A POS
ANTIBODY SCREEN: NEGATIVE
UNIT DIVISION: 0
UNIT DIVISION: 0

## 2017-05-21 LAB — BPAM RBC
Blood Product Expiration Date: 201808102359
Blood Product Expiration Date: 201808142359
ISSUE DATE / TIME: 201807301144
ISSUE DATE / TIME: 201807301144
UNIT TYPE AND RH: 6200
Unit Type and Rh: 6200

## 2017-05-22 ENCOUNTER — Telehealth: Payer: Self-pay | Admitting: *Deleted

## 2017-05-22 NOTE — Telephone Encounter (Signed)
Received report of gram positive rods on blood culture from lab. Dr. [name] notified: Await final report. Will review with ID. Called pt, he reports he began Levaquin Tues AM. Temperatures since then have been "99ish." Instructed pt and wife to call office for fever greater than 100.5. Will call with recommendation from ID. They both voiced understanding.  Pt stated he wants to take antibiotics at home if at all possible. Informed him we will call when sensitivity results are back.

## 2017-05-23 NOTE — Telephone Encounter (Signed)
Spoke with pt's wife, temp is staying around 99.5, pt continues Levaquin and is taking Tylenol. Denies any shaking chills or other signs of infection. Dr. [name] made aware. Awaiting sensitivity result to discuss with ID doctor.  Instructed them to call office if he develops any of those.  She voiced understanding.

## 2017-05-24 ENCOUNTER — Telehealth: Payer: Self-pay | Admitting: *Deleted

## 2017-05-24 ENCOUNTER — Other Ambulatory Visit: Payer: Self-pay | Admitting: *Deleted

## 2017-05-24 DIAGNOSIS — D849 Immunodeficiency, unspecified: Secondary | ICD-10-CM

## 2017-05-24 DIAGNOSIS — D899 Disorder involving the immune mechanism, unspecified: Secondary | ICD-10-CM

## 2017-05-24 DIAGNOSIS — C250 Malignant neoplasm of head of pancreas: Secondary | ICD-10-CM

## 2017-05-24 NOTE — Telephone Encounter (Signed)
Dr. [name] discussed case with Infectious Disease MD. Recommendation is to discontinue Levaquin and begin IV vancomycin x 2 weeks. Followed by Augmentin 500 mg indefinitely. Called wife with above plan. Informed her of referral to [facility]. She reports temperature has remained in the 99 degree range. He feels OK.  Spoke with [name], RN in [place]. They can start pt on 8/4 [facility] pharmacy will dose Vancomycin. Confirmed pt has tolerated Vancomycin in the past.  They will contact pt to schedule visit.  Wife aware, voiced appreciation for call.

## 2017-05-27 ENCOUNTER — Ambulatory Visit (HOSPITAL_BASED_OUTPATIENT_CLINIC_OR_DEPARTMENT_OTHER): Payer: Medicare Other

## 2017-05-27 ENCOUNTER — Ambulatory Visit (HOSPITAL_BASED_OUTPATIENT_CLINIC_OR_DEPARTMENT_OTHER): Payer: Medicare Other | Admitting: Nurse Practitioner

## 2017-05-27 ENCOUNTER — Other Ambulatory Visit (HOSPITAL_BASED_OUTPATIENT_CLINIC_OR_DEPARTMENT_OTHER): Payer: Medicare Other

## 2017-05-27 VITALS — BP 131/82 | HR 75 | Temp 98.5°F | Resp 18 | Ht 72.0 in | Wt 166.0 lb

## 2017-05-27 DIAGNOSIS — Z95828 Presence of other vascular implants and grafts: Secondary | ICD-10-CM

## 2017-05-27 DIAGNOSIS — G620 Drug-induced polyneuropathy: Secondary | ICD-10-CM | POA: Diagnosis not present

## 2017-05-27 DIAGNOSIS — C250 Malignant neoplasm of head of pancreas: Secondary | ICD-10-CM | POA: Diagnosis not present

## 2017-05-27 DIAGNOSIS — C787 Secondary malignant neoplasm of liver and intrahepatic bile duct: Secondary | ICD-10-CM | POA: Diagnosis not present

## 2017-05-27 DIAGNOSIS — Z452 Encounter for adjustment and management of vascular access device: Secondary | ICD-10-CM

## 2017-05-27 LAB — CBC WITH DIFFERENTIAL/PLATELET
BASO%: 0.5 % (ref 0.0–2.0)
BASOS ABS: 0.1 10*3/uL (ref 0.0–0.1)
EOS ABS: 0.1 10*3/uL (ref 0.0–0.5)
EOS%: 0.7 % (ref 0.0–7.0)
HEMATOCRIT: 27.4 % — AB (ref 38.4–49.9)
HGB: 9 g/dL — ABNORMAL LOW (ref 13.0–17.1)
LYMPH#: 0.6 10*3/uL — AB (ref 0.9–3.3)
LYMPH%: 5.2 % — ABNORMAL LOW (ref 14.0–49.0)
MCH: 30.1 pg (ref 27.2–33.4)
MCHC: 33 g/dL (ref 32.0–36.0)
MCV: 91.3 fL (ref 79.3–98.0)
MONO#: 1.4 10*3/uL — AB (ref 0.1–0.9)
MONO%: 11.5 % (ref 0.0–14.0)
NEUT#: 9.9 10*3/uL — ABNORMAL HIGH (ref 1.5–6.5)
NEUT%: 82.1 % — AB (ref 39.0–75.0)
PLATELETS: 197 10*3/uL (ref 140–400)
RBC: 3 10*6/uL — ABNORMAL LOW (ref 4.20–5.82)
RDW: 16.9 % — ABNORMAL HIGH (ref 11.0–14.6)
WBC: 12.1 10*3/uL — ABNORMAL HIGH (ref 4.0–10.3)

## 2017-05-27 LAB — COMPREHENSIVE METABOLIC PANEL
ALT: 47 U/L (ref 0–55)
ANION GAP: 8 meq/L (ref 3–11)
AST: 63 U/L — ABNORMAL HIGH (ref 5–34)
Albumin: 1.7 g/dL — ABNORMAL LOW (ref 3.5–5.0)
Alkaline Phosphatase: 782 U/L — ABNORMAL HIGH (ref 40–150)
BUN: 19.1 mg/dL (ref 7.0–26.0)
CALCIUM: 8.5 mg/dL (ref 8.4–10.4)
CHLORIDE: 99 meq/L (ref 98–109)
CO2: 22 mEq/L (ref 22–29)
CREATININE: 0.8 mg/dL (ref 0.7–1.3)
EGFR: 90 mL/min/{1.73_m2} — ABNORMAL LOW (ref >90)
Glucose: 192 mg/dl — ABNORMAL HIGH (ref 70–140)
POTASSIUM: 4.9 meq/L (ref 3.5–5.1)
Sodium: 129 mEq/L — ABNORMAL LOW (ref 136–145)
Total Bilirubin: 4.98 mg/dL (ref 0.20–1.20)
Total Protein: 5.8 g/dL — ABNORMAL LOW (ref 6.4–8.3)

## 2017-05-27 MED ORDER — ALPRAZOLAM 0.25 MG PO TABS: 0.2500 mg | ORAL_TABLET | Freq: Every evening | ORAL | 0 refills | Status: DC | PRN

## 2017-05-27 MED ORDER — LEVOTHYROXINE SODIUM 25 MCG PO TABS: 25.0000 ug | ORAL_TABLET | Freq: Every day | ORAL | 0 refills | Status: DC

## 2017-05-27 MED ORDER — TRAMADOL HCL 50 MG PO TABS: 50.0000 mg | ORAL_TABLET | Freq: Four times a day (QID) | ORAL | 0 refills | Status: DC | PRN

## 2017-05-27 MED ORDER — ONDANSETRON HCL 8 MG PO TABS: 8.0000 mg | ORAL_TABLET | Freq: Three times a day (TID) | ORAL | 2 refills | Status: AC | PRN

## 2017-05-27 MED ORDER — HYDROCODONE-ACETAMINOPHEN 5-325 MG PO TABS: 1.0000 | ORAL_TABLET | Freq: Four times a day (QID) | ORAL | 0 refills | Status: DC | PRN

## 2017-05-27 MED ORDER — SODIUM CHLORIDE 0.9 % IJ SOLN
10.0000 mL | INTRAMUSCULAR | Status: DC | PRN
  Administered 2017-05-27: 10 mL via INTRAVENOUS
  Filled 2017-05-27: qty 10

## 2017-05-27 NOTE — Progress Notes (Addendum)
[facility] OFFICE PROGRESS NOTE   Diagnosis:  Pancreas cancer  INTERVAL HISTORY:   Jacob Hardin returns as scheduled. He notes no improvement in his energy level since the recent blood transfusion. He has developed abdominal distention and leg swelling. No fever. No shaking chills. He has intermittent pain at the right anterior upper abdomen. He takes tramadol as needed with partial relief. He is having difficulty sleeping. He "paces" part of the night.  Objective:  Vital signs in last 24 hours:  Blood pressure 131/82, pulse 75, temperature 98.5 F (36.9 C), temperature source Oral, resp. rate 18, height 6' (1.829 m), weight 166 lb (75.3 kg), SpO2 100 %.    HEENT: No thrush or ulcers. Scleral icterus. Resp: Lungs clear bilaterally. Cardio: Regular rate and rhythm. GI: Abdomen is distended consistent with ascites. Right upper quadrant biliary drain. Vascular: Pitting edema at the lower legs bilaterally. Neuro: Alert and oriented.  Skin: Jaundice appearing. Port-A-Cath without erythema.    Lab Results:  Lab Results  Component Value Date   WBC 12.1 (H) 05/27/2017   HGB 9.0 (L) 05/27/2017   HCT 27.4 (L) 05/27/2017   MCV 91.3 05/27/2017   PLT 197 05/27/2017   NEUTROABS 9.9 (H) 05/27/2017    Imaging:  No results found.  Medications: I have reviewed the patient's current medications.  Assessment/Plan: 1. 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  Cycle 4 FOLFIRINOX  02/01/2016  Cycle 5 FOLFIRINOX 02/15/2016  Cycle 6 FOLFIRINOX 02/29/2016  Cycle 7 FOLFIRINOX 03/14/2016  Cycle 8 FOLFIRINOX 03/28/2016  CT abdomen/pelvis 04/09/2016-improvement in pancreas head mass and liver metastases, no new metastatic disease  Chemotherapy switch to FOLFIRI every 3 weeks beginning 04/11/2016  CT 07/30/2016-improvement in liver metastases, stable pancreas mass   FOLFIRI continued on a 3 week schedule  FOLFIRI 09/19/2016.  Treatment subsequently placed on hold due to biliary obstruction  CT 10/13/2016-no change in liver metastases or pancreas mass  FOLFIRI resumed on a 3 week schedule 01/07/2017  FOLFIRI 01/28/2017  FOLFIRI 02/26/2017  FOLFIRI 03/11/2017 (irinotecan dose reduced secondary to prolonged nausea)  FOLFIRI 04/01/2017  CT 04/12/2017-new and enlarging liver metastases, new soft tissue fullness at the pancreas tail  CT 05/09/2017-progressive liver metastases, increased ascites  Cycle 1 gemcitabine/Abraxane 05/13/2017 (Abraxane held secondary to hyperbilirubinemia)   2. Diabetes  3. History of Anorexia/weight loss secondary to #1  4. Obstructive jaundice secondary to #1-status post placement of a percutaneous internal/external biliary drain 11/25/2015; biliary drainage catheter capped 12/02/2015; biliary drain internalized 12/13/2015  5. Port-A-Cath placement 11/30/2015 interventional radiology  6. Admission 01/17/2016 with a high fever and rigors-no apparent source for infection upon review of his history and examination, blood cultures positive for gram-positive cocci--viridans streptococcus--completed 2 weeks of IV ceftriaxone; no endocarditis on TTE.  7. Oxaliplatin neuropathy  8. Elevated transaminases, alkaline phosphatase, and bilirubin 06/13/2016.06/19/2016 bilirubin improved to normal range; liver enzymes remain elevated.  9. Generalized pruritus,no rash 10/10/2016.Likely secondary to  hyperbilirubinemia. Resolved 11/12/2016. Recurrent 12/03/2016.Persistent pruritus.  10. Hospitalization 10/10/2016 through 10/20/2016 with biliary obstruction/sepsis. Status post placement of a biliary drain in interventional radiology 10/16/2016. Status post  ERCP 10/26/2016 with biliary stent exchange. There was no bile drainage. Status post cholangiogram and exchange of biliary drain 11/07/2016.Biliary drain capped 11/12/2016.  Exchange of right sided percutaneous biliary drainage catheter 02/12/2017  Exchange of external biliary drain for internal/external biliary drain 03/20/2017  EGD 04/10/2017-gastric stenosis at the [facility], status post placement of a stent  New internal/external biliary drain 04/16/2017  Exchange of partially obstructed internal/external biliary drain 05/10/2017  11. 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 C. difficile colitis 04/26/2018status post course of vancomycin.  14. Delayed nausea following chemotherapy-irinotecan dose reduced and prophylactic Decadron added with chemotherapy 03/11/2017; no significant nausea following chemotherapy 03/11/2017.  15. Admission with Klebsiella bacteremia 06/22/2018and 05/08/2017  16. Blood culture 05/20/2017 positive for Lactobacillus. Vancomycin initiated 05/25/2017 with 2 weeks planned to be followed by indefinite Augmentin.    Disposition: Jacob Hardin appears unchanged. We will hold today's chemotherapy due to the persistent hyperbilirubinemia and recent positive blood culture. He will continue IV vancomycin for a total of 2 weeks and then begin indefinite Augmentin. Dr. [name] will contact Dr. [name] regarding the persistently elevated bilirubin.  Clinically he appears to have ascites. We are referring him for a diagnostic/therapeutic paracentesis.  For the difficulty sleeping, question anxiety at nighttime he will try low-dose Xanax.  He is aware he should not combine this with pain medication.  For the abdominal pain he was given a prescription for hydrocodone 1-2 tablets every 6 hours as needed. We also refilled tramadol. He understands he should not take these together.  Preliminary discussion regarding hospice initiated at today's visit.  He will return for a follow-up visit in one week. He will contact the office in the interim with any problems.  Patient seen with Dr. [name]. 25 minutes were spent face-to-face at today's visit with the majority of that time involved in counseling/coordination of care.  [name] ANP/GNP-BC   05/27/2017  11:27 AM  This was a shared visit with [name]. Jacob Hardin was interviewed and examined. He appears to have ascites. He is completing IV antibiotics for the Lactobacillus bacteremia. I will discuss the case with Dr. [name] this week to see if there are any other options for relieving the hyperbilirubinemia.  I discussed the case with infectious disease on 05/24/2017. They recommend 2 weeks of vancomycin followed by indefinite Augmentin. We will recommend hospice care if the paracentesis confirms carcinomatosis.  [name], M.D.

## 2017-05-27 NOTE — Patient Instructions (Signed)

## 2017-05-28 ENCOUNTER — Ambulatory Visit (HOSPITAL_COMMUNITY): Admission: RE | Admit: 2017-05-28 | Payer: Medicare Other | Source: Ambulatory Visit

## 2017-05-28 ENCOUNTER — Ambulatory Visit (HOSPITAL_COMMUNITY): Payer: Medicare Other

## 2017-05-28 ENCOUNTER — Telehealth: Payer: Self-pay | Admitting: *Deleted

## 2017-05-28 ENCOUNTER — Ambulatory Visit: Payer: Medicare Other

## 2017-05-28 ENCOUNTER — Ambulatory Visit (HOSPITAL_COMMUNITY)
Admission: RE | Admit: 2017-05-28 | Discharge: 2017-05-28 | Disposition: A | Discharge status: Home / Self Care | Payer: Medicare Other | Source: Ambulatory Visit | Attending: Nurse Practitioner | Admitting: Nurse Practitioner

## 2017-05-28 ENCOUNTER — Encounter (HOSPITAL_COMMUNITY): Payer: Self-pay | Admitting: Student

## 2017-05-28 DIAGNOSIS — C250 Malignant neoplasm of head of pancreas: Secondary | ICD-10-CM

## 2017-05-28 DIAGNOSIS — Z8507 Personal history of malignant neoplasm of pancreas: Secondary | ICD-10-CM | POA: Diagnosis not present

## 2017-05-28 DIAGNOSIS — R188 Other ascites: Secondary | ICD-10-CM | POA: Insufficient documentation

## 2017-05-28 HISTORY — PX: IR PARACENTESIS: IMG2679

## 2017-05-28 LAB — CULTURE, BLOOD (SINGLE)

## 2017-05-28 LAB — GRAM STAIN

## 2017-05-28 MED ORDER — LIDOCAINE HCL (PF) 1 % IJ SOLN
INTRAMUSCULAR | Status: AC
  Filled 2017-05-28: qty 30

## 2017-05-28 MED ORDER — LIDOCAINE HCL 1 % IJ SOLN
INTRAMUSCULAR | Status: DC | PRN
  Administered 2017-05-28: 5 mL

## 2017-05-28 NOTE — Procedures (Signed)
PROCEDURE SUMMARY:  Successful US guided paracentesis from left lateral abdomen.  Yielded 1.8 liters of hazy, yellow fluid.  No immediate complications.  Pt tolerated well.   Specimen was sent for labs.  Docia Barrier PA-C 05/28/2017 2:10 PM

## 2017-05-28 NOTE — Telephone Encounter (Signed)
Spoke with wife, pt tolerated paracentesis OK today. They got less than 2 liters. He is not in as much pain today. He has not tried 2 Hydrocodone. Had nausea after taking one. Tramadol did not help pain. Per [name], NP: We can try Oxycodone. Will need to pick up a prescription. He just took another dose of Hydrocodone. Recommended he take Zofran to see if this helps. Wife voiced understanding, she will call in AM with update.

## 2017-05-28 NOTE — Telephone Encounter (Signed)
Message from pt's wife reporting Hydrocodone "did not touch the pain." Thinks he may need to be admitted for pain control. Returned call, left message requesting she call back. Has he tried taking 2 Hydrocodone?

## 2017-05-29 ENCOUNTER — Other Ambulatory Visit: Payer: Self-pay

## 2017-05-30 ENCOUNTER — Telehealth: Payer: Self-pay | Admitting: *Deleted

## 2017-05-30 MED ORDER — PENICILLIN V POTASSIUM 500 MG PO TABS: 500.0000 mg | ORAL_TABLET | Freq: Four times a day (QID) | ORAL | 0 refills | Status: AC

## 2017-05-30 NOTE — Telephone Encounter (Signed)
Per Dr. [name]: Discontinue vancomycin. Infection is resistant. Start penicillin 500 mg QID for 2 weeks. Called wife with instructions, she voiced understanding.  Notified [name], RN in [place] to [facility]. They will send out a nurse to flush and de-access port. Wife reports pt continues to have abdominal pain. Seems to be worse at night. He is taking Hydrocodone 2 tablets which helps the pain but causes nausea. She wants to wait to see if his pain is better after starting new antibiotic.  Reports temp last night was 99.7 (has not been this high in a week).  She will call with any changes.

## 2017-05-31 NOTE — Telephone Encounter (Signed)
VM message from Mardene Celeste, [place] with [facility].  She called to say that nurse did not go to pt's home to flush port needle and de-access as pt's wife had a "nurse friend" come over to de-access port.

## 2017-06-01 LAB — CULTURE, BODY FLUID-BOTTLE

## 2017-06-01 LAB — CULTURE, BODY FLUID W GRAM STAIN -BOTTLE

## 2017-06-03 ENCOUNTER — Encounter (HOSPITAL_COMMUNITY): Payer: Self-pay | Admitting: Radiology

## 2017-06-03 ENCOUNTER — Ambulatory Visit: Payer: Medicare Other

## 2017-06-03 ENCOUNTER — Ambulatory Visit: Payer: Medicare Other | Admitting: Oncology

## 2017-06-03 ENCOUNTER — Emergency Department (HOSPITAL_COMMUNITY): Payer: Medicare Other

## 2017-06-03 ENCOUNTER — Observation Stay (HOSPITAL_COMMUNITY)
Admission: EM | Admit: 2017-06-03 | Discharge: 2017-06-05 | Disposition: A | Discharge status: Home / Self Care | Payer: Medicare Other | Attending: Family Medicine | Admitting: Family Medicine

## 2017-06-03 ENCOUNTER — Other Ambulatory Visit: Payer: Medicare Other

## 2017-06-03 ENCOUNTER — Inpatient Hospital Stay (HOSPITAL_COMMUNITY): Payer: Medicare Other

## 2017-06-03 DIAGNOSIS — E785 Hyperlipidemia, unspecified: Secondary | ICD-10-CM | POA: Diagnosis not present

## 2017-06-03 DIAGNOSIS — L299 Pruritus, unspecified: Secondary | ICD-10-CM | POA: Diagnosis not present

## 2017-06-03 DIAGNOSIS — Z85828 Personal history of other malignant neoplasm of skin: Secondary | ICD-10-CM | POA: Insufficient documentation

## 2017-06-03 DIAGNOSIS — K831 Obstruction of bile duct: Secondary | ICD-10-CM | POA: Insufficient documentation

## 2017-06-03 DIAGNOSIS — I7389 Other specified peripheral vascular diseases: Secondary | ICD-10-CM | POA: Insufficient documentation

## 2017-06-03 DIAGNOSIS — D72825 Bandemia: Secondary | ICD-10-CM | POA: Insufficient documentation

## 2017-06-03 DIAGNOSIS — R918 Other nonspecific abnormal finding of lung field: Secondary | ICD-10-CM | POA: Insufficient documentation

## 2017-06-03 DIAGNOSIS — R590 Localized enlarged lymph nodes: Secondary | ICD-10-CM | POA: Insufficient documentation

## 2017-06-03 DIAGNOSIS — Z7189 Other specified counseling: Secondary | ICD-10-CM | POA: Diagnosis not present

## 2017-06-03 DIAGNOSIS — D649 Anemia, unspecified: Secondary | ICD-10-CM | POA: Diagnosis present

## 2017-06-03 DIAGNOSIS — R509 Fever, unspecified: Secondary | ICD-10-CM | POA: Diagnosis not present

## 2017-06-03 DIAGNOSIS — E162 Hypoglycemia, unspecified: Secondary | ICD-10-CM | POA: Insufficient documentation

## 2017-06-03 DIAGNOSIS — T80219A Unspecified infection due to central venous catheter, initial encounter: Secondary | ICD-10-CM | POA: Insufficient documentation

## 2017-06-03 DIAGNOSIS — C250 Malignant neoplasm of head of pancreas: Secondary | ICD-10-CM | POA: Diagnosis not present

## 2017-06-03 DIAGNOSIS — R4781 Slurred speech: Secondary | ICD-10-CM | POA: Insufficient documentation

## 2017-06-03 DIAGNOSIS — G629 Polyneuropathy, unspecified: Secondary | ICD-10-CM | POA: Insufficient documentation

## 2017-06-03 DIAGNOSIS — C784 Secondary malignant neoplasm of small intestine: Secondary | ICD-10-CM | POA: Diagnosis not present

## 2017-06-03 DIAGNOSIS — R188 Other ascites: Secondary | ICD-10-CM

## 2017-06-03 DIAGNOSIS — Z794 Long term (current) use of insulin: Secondary | ICD-10-CM | POA: Insufficient documentation

## 2017-06-03 DIAGNOSIS — R748 Abnormal levels of other serum enzymes: Secondary | ICD-10-CM | POA: Insufficient documentation

## 2017-06-03 DIAGNOSIS — Z885 Allergy status to narcotic agent status: Secondary | ICD-10-CM | POA: Insufficient documentation

## 2017-06-03 DIAGNOSIS — I517 Cardiomegaly: Secondary | ICD-10-CM | POA: Insufficient documentation

## 2017-06-03 DIAGNOSIS — C787 Secondary malignant neoplasm of liver and intrahepatic bile duct: Secondary | ICD-10-CM | POA: Diagnosis present

## 2017-06-03 DIAGNOSIS — E139 Other specified diabetes mellitus without complications: Secondary | ICD-10-CM | POA: Diagnosis not present

## 2017-06-03 DIAGNOSIS — R0602 Shortness of breath: Secondary | ICD-10-CM | POA: Insufficient documentation

## 2017-06-03 DIAGNOSIS — Z515 Encounter for palliative care: Secondary | ICD-10-CM | POA: Diagnosis not present

## 2017-06-03 DIAGNOSIS — E039 Hypothyroidism, unspecified: Secondary | ICD-10-CM | POA: Diagnosis not present

## 2017-06-03 DIAGNOSIS — A419 Sepsis, unspecified organism: Secondary | ICD-10-CM | POA: Diagnosis not present

## 2017-06-03 DIAGNOSIS — K219 Gastro-esophageal reflux disease without esophagitis: Secondary | ICD-10-CM | POA: Insufficient documentation

## 2017-06-03 DIAGNOSIS — R0600 Dyspnea, unspecified: Secondary | ICD-10-CM | POA: Diagnosis not present

## 2017-06-03 DIAGNOSIS — R410 Disorientation, unspecified: Secondary | ICD-10-CM | POA: Diagnosis not present

## 2017-06-03 DIAGNOSIS — E119 Type 2 diabetes mellitus without complications: Secondary | ICD-10-CM | POA: Diagnosis not present

## 2017-06-03 DIAGNOSIS — Z8249 Family history of ischemic heart disease and other diseases of the circulatory system: Secondary | ICD-10-CM | POA: Insufficient documentation

## 2017-06-03 DIAGNOSIS — I10 Essential (primary) hypertension: Secondary | ICD-10-CM | POA: Insufficient documentation

## 2017-06-03 DIAGNOSIS — Z981 Arthrodesis status: Secondary | ICD-10-CM | POA: Diagnosis not present

## 2017-06-03 DIAGNOSIS — Y848 Other medical procedures as the cause of abnormal reaction of the patient, or of later complication, without mention of misadventure at the time of the procedure: Secondary | ICD-10-CM | POA: Insufficient documentation

## 2017-06-03 DIAGNOSIS — F419 Anxiety disorder, unspecified: Secondary | ICD-10-CM | POA: Insufficient documentation

## 2017-06-03 DIAGNOSIS — R161 Splenomegaly, not elsewhere classified: Secondary | ICD-10-CM | POA: Insufficient documentation

## 2017-06-03 DIAGNOSIS — R109 Unspecified abdominal pain: Secondary | ICD-10-CM

## 2017-06-03 DIAGNOSIS — G2581 Restless legs syndrome: Secondary | ICD-10-CM | POA: Insufficient documentation

## 2017-06-03 DIAGNOSIS — E440 Moderate protein-calorie malnutrition: Secondary | ICD-10-CM | POA: Insufficient documentation

## 2017-06-03 DIAGNOSIS — Z8619 Personal history of other infectious and parasitic diseases: Secondary | ICD-10-CM | POA: Insufficient documentation

## 2017-06-03 DIAGNOSIS — R911 Solitary pulmonary nodule: Secondary | ICD-10-CM | POA: Insufficient documentation

## 2017-06-03 DIAGNOSIS — C251 Malignant neoplasm of body of pancreas: Secondary | ICD-10-CM

## 2017-06-03 DIAGNOSIS — R140 Abdominal distension (gaseous): Secondary | ICD-10-CM | POA: Insufficient documentation

## 2017-06-03 DIAGNOSIS — R531 Weakness: Secondary | ICD-10-CM | POA: Insufficient documentation

## 2017-06-03 DIAGNOSIS — J90 Pleural effusion, not elsewhere classified: Secondary | ICD-10-CM | POA: Insufficient documentation

## 2017-06-03 DIAGNOSIS — E871 Hypo-osmolality and hyponatremia: Secondary | ICD-10-CM | POA: Diagnosis not present

## 2017-06-03 DIAGNOSIS — K830 Cholangitis: Secondary | ICD-10-CM | POA: Insufficient documentation

## 2017-06-03 DIAGNOSIS — C259 Malignant neoplasm of pancreas, unspecified: Secondary | ICD-10-CM

## 2017-06-03 DIAGNOSIS — Z809 Family history of malignant neoplasm, unspecified: Secondary | ICD-10-CM | POA: Insufficient documentation

## 2017-06-03 DIAGNOSIS — Z833 Family history of diabetes mellitus: Secondary | ICD-10-CM | POA: Insufficient documentation

## 2017-06-03 DIAGNOSIS — R180 Malignant ascites: Secondary | ICD-10-CM

## 2017-06-03 DIAGNOSIS — R7881 Bacteremia: Secondary | ICD-10-CM | POA: Insufficient documentation

## 2017-06-03 DIAGNOSIS — R079 Chest pain, unspecified: Secondary | ICD-10-CM | POA: Insufficient documentation

## 2017-06-03 LAB — URINALYSIS, ROUTINE W REFLEX MICROSCOPIC
Glucose, UA: NEGATIVE mg/dL
Hgb urine dipstick: NEGATIVE
Ketones, ur: NEGATIVE mg/dL
Leukocytes, UA: NEGATIVE
NITRITE: NEGATIVE
PH: 5 (ref 5.0–8.0)
Protein, ur: NEGATIVE mg/dL
SPECIFIC GRAVITY, URINE: 1.017 (ref 1.005–1.030)

## 2017-06-03 LAB — COMPREHENSIVE METABOLIC PANEL
ALT: 43 U/L (ref 17–63)
ANION GAP: 12 (ref 5–15)
AST: 72 U/L — ABNORMAL HIGH (ref 15–41)
Albumin: 1.7 g/dL — ABNORMAL LOW (ref 3.5–5.0)
Alkaline Phosphatase: 1305 U/L — ABNORMAL HIGH (ref 38–126)
BILIRUBIN TOTAL: 4.7 mg/dL — AB (ref 0.3–1.2)
BUN: 20 mg/dL (ref 6–20)
CO2: 23 mmol/L (ref 22–32)
Calcium: 8.2 mg/dL — ABNORMAL LOW (ref 8.9–10.3)
Chloride: 93 mmol/L — ABNORMAL LOW (ref 101–111)
Creatinine, Ser: 1.04 mg/dL (ref 0.61–1.24)
GFR calc Af Amer: >60 mL/min (ref >60)
Glucose, Bld: 153 mg/dL — ABNORMAL HIGH (ref 65–99)
POTASSIUM: 5.2 mmol/L — AB (ref 3.5–5.1)
Sodium: 128 mmol/L — ABNORMAL LOW (ref 135–145)
TOTAL PROTEIN: 6.5 g/dL (ref 6.5–8.1)

## 2017-06-03 LAB — CBC WITH DIFFERENTIAL/PLATELET
Band Neutrophils: 35 %
Basophils Absolute: 0 10*3/uL (ref 0.0–0.1)
Basophils Relative: 0 %
Blasts: 0 %
EOS PCT: 1 %
Eosinophils Absolute: 0.3 10*3/uL (ref 0.0–0.7)
HEMATOCRIT: 26.5 % — AB (ref 39.0–52.0)
Hemoglobin: 9.2 g/dL — ABNORMAL LOW (ref 13.0–17.0)
LYMPHS ABS: 1.6 10*3/uL (ref 0.7–4.0)
Lymphocytes Relative: 5 %
MCH: 30.3 pg (ref 26.0–34.0)
MCHC: 34.7 g/dL (ref 30.0–36.0)
MCV: 87.2 fL (ref 78.0–100.0)
MONOS PCT: 11 %
Metamyelocytes Relative: 0 %
Monocytes Absolute: 3.6 10*3/uL — ABNORMAL HIGH (ref 0.1–1.0)
Myelocytes: 0 %
NEUTROS ABS: 26.9 10*3/uL — AB (ref 1.7–7.7)
NEUTROS PCT: 47 %
NRBC: 0 /100{WBCs}
Other: 1 %
Platelets: 407 10*3/uL — ABNORMAL HIGH (ref 150–400)
Promyelocytes Absolute: 0 %
RBC: 3.04 MIL/uL — AB (ref 4.22–5.81)
RDW: 17.3 % — AB (ref 11.5–15.5)
WBC Morphology: INCREASED
WBC: 32.8 10*3/uL — AB (ref 4.0–10.5)

## 2017-06-03 LAB — APTT: APTT: 41 s — AB (ref 24–36)

## 2017-06-03 LAB — GRAM STAIN

## 2017-06-03 LAB — GLUCOSE, CAPILLARY
GLUCOSE-CAPILLARY: 198 mg/dL — AB (ref 65–99)
Glucose-Capillary: 146 mg/dL — ABNORMAL HIGH (ref 65–99)
Glucose-Capillary: 191 mg/dL — ABNORMAL HIGH (ref 65–99)
Glucose-Capillary: 230 mg/dL — ABNORMAL HIGH (ref 65–99)

## 2017-06-03 LAB — PROTIME-INR
INR: 1.32
Prothrombin Time: 16.5 seconds — ABNORMAL HIGH (ref 11.4–15.2)

## 2017-06-03 LAB — ALBUMIN, PLEURAL OR PERITONEAL FLUID: Albumin, Fluid: <1 g/dL

## 2017-06-03 LAB — BODY FLUID CELL COUNT WITH DIFFERENTIAL
Lymphs, Fluid: 21 %
Monocyte-Macrophage-Serous Fluid: 19 % — ABNORMAL LOW (ref 50–90)
NEUTROPHIL FLUID: 60 % — AB (ref 0–25)
WBC FLUID: 2928 uL — AB (ref 0–1000)

## 2017-06-03 LAB — I-STAT CG4 LACTIC ACID, ED: Lactic Acid, Venous: 3.25 mmol/L (ref 0.5–1.9)

## 2017-06-03 LAB — LACTIC ACID, PLASMA: Lactic Acid, Venous: 2.1 mmol/L (ref 0.5–1.9)

## 2017-06-03 LAB — AMMONIA: AMMONIA: 41 umol/L — AB (ref 9–35)

## 2017-06-03 MED ORDER — VANCOMYCIN HCL IN DEXTROSE 1-5 GM/200ML-% IV SOLN
1000.0000 mg | Freq: Once | INTRAVENOUS | Status: AC
  Administered 2017-06-03: 1000 mg via INTRAVENOUS
  Filled 2017-06-03: qty 200

## 2017-06-03 MED ORDER — LEVOTHYROXINE SODIUM 25 MCG PO TABS
25.0000 ug | ORAL_TABLET | Freq: Every day | ORAL | Status: DC
  Administered 2017-06-03 – 2017-06-05 (×3): 25 ug via ORAL
  Filled 2017-06-03 (×3): qty 1

## 2017-06-03 MED ORDER — INSULIN ASPART 100 UNIT/ML ~~LOC~~ SOLN
0.0000 [IU] | Freq: Three times a day (TID) | SUBCUTANEOUS | Status: DC
Start: 2017-06-03 — End: 2017-06-05

## 2017-06-03 MED ORDER — HYDROMORPHONE HCL-NACL 0.5-0.9 MG/ML-% IV SOSY
0.5000 mg | PREFILLED_SYRINGE | INTRAVENOUS | Status: DC | PRN
  Filled 2017-06-03: qty 1

## 2017-06-03 MED ORDER — SODIUM CHLORIDE 0.9% FLUSH
10.0000 mL | INTRAVENOUS | Status: DC | PRN
Start: 2017-06-03 — End: 2017-06-05
  Administered 2017-06-05: 10 mL
  Filled 2017-06-03: qty 40

## 2017-06-03 MED ORDER — IOPAMIDOL (ISOVUE-370) INJECTION 76%
100.0000 mL | Freq: Once | INTRAVENOUS | Status: AC | PRN
  Administered 2017-06-03: 100 mL via INTRAVENOUS

## 2017-06-03 MED ORDER — POLYETHYLENE GLYCOL 3350 17 G PO PACK: 17.0000 g | PACK | Freq: Every day | ORAL | Status: DC | PRN

## 2017-06-03 MED ORDER — ALPRAZOLAM 0.25 MG PO TABS
0.2500 mg | ORAL_TABLET | Freq: Every evening | ORAL | Status: DC | PRN
  Administered 2017-06-04 – 2017-06-05 (×2): 0.25 mg via ORAL
  Filled 2017-06-03 (×2): qty 1

## 2017-06-03 MED ORDER — METRONIDAZOLE IN NACL 5-0.79 MG/ML-% IV SOLN
500.0000 mg | Freq: Once | INTRAVENOUS | Status: AC
  Administered 2017-06-03: 500 mg via INTRAVENOUS
  Filled 2017-06-03 (×2): qty 100

## 2017-06-03 MED ORDER — INSULIN DETEMIR 100 UNIT/ML ~~LOC~~ SOLN
25.0000 [IU] | Freq: Every day | SUBCUTANEOUS | Status: DC
  Filled 2017-06-03 (×3): qty 0.25

## 2017-06-03 MED ORDER — SODIUM CHLORIDE 0.9 % IV SOLN
INTRAVENOUS | Status: DC
  Administered 2017-06-03: 07:00:00 via INTRAVENOUS

## 2017-06-03 MED ORDER — ENSURE ENLIVE PO LIQD: 237.0000 mL | Freq: Two times a day (BID) | ORAL | Status: DC

## 2017-06-03 MED ORDER — LIDOCAINE-EPINEPHRINE (PF) 2 %-1:200000 IJ SOLN
INTRAMUSCULAR | Status: AC
  Filled 2017-06-03: qty 20

## 2017-06-03 MED ORDER — VANCOMYCIN HCL IN DEXTROSE 1-5 GM/200ML-% IV SOLN
1000.0000 mg | Freq: Two times a day (BID) | INTRAVENOUS | Status: DC
  Administered 2017-06-03 – 2017-06-04 (×2): 1000 mg via INTRAVENOUS
  Filled 2017-06-03 (×2): qty 200

## 2017-06-03 MED ORDER — DEXTROSE 5 % IV SOLN
2.0000 g | Freq: Once | INTRAVENOUS | Status: AC
  Administered 2017-06-03: 2 g via INTRAVENOUS
  Filled 2017-06-03: qty 2

## 2017-06-03 MED ORDER — ALPRAZOLAM 0.25 MG PO TABS
0.2500 mg | ORAL_TABLET | Freq: Three times a day (TID) | ORAL | Status: DC | PRN
  Administered 2017-06-03 – 2017-06-04 (×2): 0.25 mg via ORAL
  Filled 2017-06-03 (×2): qty 1

## 2017-06-03 MED ORDER — ONDANSETRON HCL 4 MG PO TABS
4.0000 mg | ORAL_TABLET | Freq: Four times a day (QID) | ORAL | Status: DC | PRN
  Administered 2017-06-03: 4 mg via ORAL
  Filled 2017-06-03: qty 1

## 2017-06-03 MED ORDER — IPRATROPIUM-ALBUTEROL 0.5-2.5 (3) MG/3ML IN SOLN
3.0000 mL | RESPIRATORY_TRACT | Status: DC | PRN
  Administered 2017-06-03: 3 mL via RESPIRATORY_TRACT
  Filled 2017-06-03: qty 3

## 2017-06-03 MED ORDER — BACLOFEN 10 MG PO TABS: 5.0000 mg | ORAL_TABLET | Freq: Three times a day (TID) | ORAL | Status: DC | PRN

## 2017-06-03 MED ORDER — LIDOCAINE-PRILOCAINE 2.5-2.5 % EX CREA
TOPICAL_CREAM | CUTANEOUS | Status: DC | PRN
  Filled 2017-06-03: qty 5

## 2017-06-03 MED ORDER — ACETAMINOPHEN 500 MG PO TABS: 1000.0000 mg | ORAL_TABLET | Freq: Four times a day (QID) | ORAL | Status: DC | PRN

## 2017-06-03 MED ORDER — PIPERACILLIN-TAZOBACTAM 3.375 G IVPB
3.3750 g | Freq: Three times a day (TID) | INTRAVENOUS | Status: DC
  Administered 2017-06-03 – 2017-06-04 (×4): 3.375 g via INTRAVENOUS
  Filled 2017-06-03 (×4): qty 50

## 2017-06-03 MED ORDER — PANCRELIPASE (LIP-PROT-AMYL) 12000-38000 UNITS PO CPEP
36000.0000 [IU] | ORAL_CAPSULE | Freq: Three times a day (TID) | ORAL | Status: DC
  Administered 2017-06-03 – 2017-06-04 (×4): 36000 [IU] via ORAL
  Administered 2017-06-04 (×2): 12000 [IU] via ORAL
  Administered 2017-06-05 (×2): 36000 [IU] via ORAL
  Filled 2017-06-03 (×8): qty 3

## 2017-06-03 MED ORDER — HYDROCODONE-ACETAMINOPHEN 5-325 MG PO TABS
1.0000 | ORAL_TABLET | Freq: Four times a day (QID) | ORAL | Status: DC | PRN
  Administered 2017-06-03 – 2017-06-05 (×4): 2 via ORAL
  Filled 2017-06-03: qty 1
  Filled 2017-06-03 (×4): qty 2

## 2017-06-03 MED ORDER — LIDOCAINE HCL 1 % IJ SOLN
INTRAMUSCULAR | Status: AC
  Filled 2017-06-03: qty 20

## 2017-06-03 MED ORDER — IOPAMIDOL (ISOVUE-370) INJECTION 76%
INTRAVENOUS | Status: AC
  Filled 2017-06-03: qty 100

## 2017-06-03 MED ORDER — ONDANSETRON HCL 4 MG/2ML IJ SOLN: 4.0000 mg | Freq: Four times a day (QID) | INTRAMUSCULAR | Status: DC | PRN

## 2017-06-03 MED ORDER — LIDOCAINE-EPINEPHRINE (PF) 2 %-1:200000 IJ SOLN: 20.0000 mL | Freq: Once | INTRAMUSCULAR | Status: DC

## 2017-06-03 MED ORDER — INSULIN ASPART 100 UNIT/ML ~~LOC~~ SOLN: 0.0000 [IU] | Freq: Every day | SUBCUTANEOUS | Status: DC

## 2017-06-03 NOTE — Procedures (Signed)
Ultrasound-guided diagnostic and therapeutic paracentesis performed yielding 3 liters of slightly hazy, yellow  fluid. No immediate complications. A portion of the fluid was submitted to the lab for preordered studies.

## 2017-06-03 NOTE — Progress Notes (Signed)
Pharmacy Antibiotic Note  Jacob Hardin is a 71 y.o. male admitted on 06/03/2017 with sepsis.  Pharmacy has been consulted for vancomycin/Zosyn dosing.  Plan:  Zosyn 3.375g IV q8h (4 hour infusion).   Vancomycin 1000 mg IV q12h. Target trouugh 15-20 mcg/ml  Daily SCr  Monitor clinical course, renal function, cultures as available   Height: 6' (182.9 cm) Weight: 176 lb 12.9 oz (80.2 kg) IBW/kg (Calculated) : 77.6  Temp (24hrs), Avg:98 F (36.7 C), Min:97.8 F (36.6 C), Max:98.1 F (36.7 C)   Recent Labs Lab 05/27/17 1036 06/03/17 0210 06/03/17 0413  WBC 12.1* 32.8*  --   CREATININE 0.8 1.04  --   LATICACIDVEN  --   --  3.25*    Estimated Creatinine Clearance: 73.6 mL/min (by C-G formula based on SCr of 1.04 mg/dL).    Allergies  Allergen Reactions  . Codeine Nausea Only    Antimicrobials this admission: 8/13 ceftriaxone x1  8/13 metronidazole x 1   8/13 vancomycin >>  8/13 Zosyn >>   Dose adjustments this admission: ---  Microbiology results: 8/13 BCx: sent 8/13 UCx: sent    Thank you for allowing pharmacy to be a part of this patient's care.   Royetta Asal, PharmD, BCPS Pager [phone] 06/03/2017 6:54 AM

## 2017-06-03 NOTE — Progress Notes (Signed)
PROGRESS NOTE  Subjective: Jacob Hardin is a 71 y.o. male with a history of metastatic pancreatic CA complicated by obstructive jaundice and recurrent sepsis/bacteremia since biliary drain placement earlier this year who presented early this morning for worsening generalized weakness, fatigue, and abdominal distention. His wife reported confusion as cause for ED presentation. On arrival, HR 105, RR 21, BP 125/80, afebrile. Na 128, K 5.2, SCr 1.04 (up from baseline 0.8), WBC 32.8 with bandemia, and hgb 9.2. Lactate was 3.2. CT abdomen showed mderate left pleural effusion, ascites, and new rim-enhancing lesions near stomach. Vancomycin, ceftriaxone, and flagyl were given, IV fluids started, and he was admitted early this morning.  After improving some since discharge 7/22 he had worsening abdominal swelling, had an oncology visit 7/30 where he was weak. Chemotherapy held due to anemia, 2u PRBCs given, and started on levaquin for ?recurrent cholangitis. Blood cultures were drawn, ultimately grew lactobacillus and after discussion with ID, was changed to vancomycin 8/3. Paracentesis performed 8/7 had GPRs on gram stain, culture positive for Corynebacterium for which penicillin was started.   Objective: BP 121/65 (BP Location: Left Arm)   Pulse 83   Temp 97.7 F (36.5 C) (Oral)   Resp 15   Ht 6' (1.829 m)   Wt 80.2 kg (176 lb 12.9 oz)   SpO2 95%   BMI 23.98 kg/m   Gen: Frail, ill-appearing male in no distress Pulm: Clear and nonlabored  CV: RRR, no murmur, no JVD, no edema GI: Distended, nontender, +BS Neuro: Alert and oriented. No focal deficits. Skin: Jaundiced. External biliary drain site c/d/i, nontender, otherwise without wounds.  Assessment & Plan: Sepsis: with presumed intra-abdominal source (abscess, cholangitis, SBP). - Discussed with Dr. [name], appreciate GI evaluating this patient.  - Continue broad spectrum abx - Will decrease IVF's with significant ascites and improvement in  lactic acid.  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  - Diagnostic and therapeutic paracentesis today  Obstructive jaundice: TBili stable in 4's.  - Consult GI  Hyponatremia: Chronic, stable, mild.  - DC IVF's, monitor  Stage IV pancreatic cancer:  - Oncology and GI recommending home hospice. Palliative care consulted and goals of care discussions are ongoing.  - Continue norco prn pain. IV medication available as well.  - Continue benzodiazepine prn anxiety  Secondary diabetes: Well-controlled (HbA1c 6.6%) - Continuing levemir, administer SSI qAC/HS  Hypothyroidism: Recently started synthroid due to elevated TSH (24) in June 2018.  - Continue synthroid - Recheck TSH, T3, T4 as outpatient.   Normocytic anemia: Related to chronic disease and chemotherapy. Required transfusions recently, currently stable.  - Monitor  Vance Gather, MD [facility] Pager [phone] 06/03/2017, 2:03 PM

## 2017-06-03 NOTE — Consult Note (Signed)
Reason for Consult: Sepsis, obstructive jaundice, ascites Referring Physician:  [facility]  Valeria Batman HPI: This is a 71 year old male with metastatic pancreatic cancer who is well-known to me.  Since this past June his clinical status has continued to decline rapidly with repeated bouts if sepsis from a biliary source, however, there was no overt dilation in his biliary ducts with his current internal and external stents in place.  He has numerous metastatic lesions in the liver.  The CT scan also reveals a significant amount of ascites and he underwent a paracentesis today with 3 liters of fluid removed.  Palliative consultation was performed and the patient as well as his wife desire further consultation with GI and Oncology.  Past Medical History:  Diagnosis Date  . Elevated liver enzymes   . GERD (gastroesophageal reflux disease)   . Hyperlipidemia   . Hypertension    recently weight lost-no meds now-running low.  . Obstructive jaundice   . Oxalate nephropathy   . PONV (postoperative nausea and vomiting)   . Primary pancreatic adenocarcinoma (HCC)    mets to liver and duodenum  . Pruritus   . Restless legs   . Skin cancer    "burned off my face & head"  . Type II diabetes mellitus ([place])   . Weight loss     Past Surgical History:  Procedure Laterality Date  . ANTERIOR CERVICAL DECOMP/DISCECTOMY FUSION  1994   cervical-bone graft  . BACK SURGERY    . COLONOSCOPY    . DUODENAL STENT PLACEMENT N/A 11/22/2015   Procedure: DUODENAL STENT PLACEMENT;  Surgeon: Carol Ada, MD;  Location: [facility];  Service: Endoscopy;  Laterality: N/A;  . DUODENAL STENT PLACEMENT N/A 04/10/2017   Procedure: DUODENAL STENT PLACEMENT;  Surgeon: Carol Ada, MD;  Location: [facility] ENDOSCOPY;  Service: Endoscopy;  Laterality: N/A;  . ERCP N/A 10/26/2016   Procedure: ENDOSCOPIC RETROGRADE CHOLANGIOPANCREATOGRAPHY (ERCP);  Surgeon: Carol Ada, MD;  Location: [facility] ENDOSCOPY;  Service:  Endoscopy;  Laterality: N/A;  . ESOPHAGOGASTRODUODENOSCOPY N/A 11/18/2015   Procedure: ESOPHAGOGASTRODUODENOSCOPY (EGD);  Surgeon: Carol Ada, MD;  Location: [facility] ENDOSCOPY;  Service: Endoscopy;  Laterality: N/A;  . ESOPHAGOGASTRODUODENOSCOPY N/A 11/22/2015   Procedure: ESOPHAGOGASTRODUODENOSCOPY (EGD);  Surgeon: Carol Ada, MD;  Location: [facility] ENDOSCOPY;  Service: Endoscopy;  Laterality: N/A;  Uncovered duodenal stent placement.  Fluoroscopy required.  . ESOPHAGOGASTRODUODENOSCOPY N/A 10/14/2016   Procedure: ESOPHAGOGASTRODUODENOSCOPY (EGD);  Surgeon: Carol Ada, MD;  Location: [facility];  Service: Endoscopy;  Laterality: N/A;  . ESOPHAGOGASTRODUODENOSCOPY (EGD) WITH PROPOFOL N/A 04/10/2017   Procedure: ESOPHAGOGASTRODUODENOSCOPY (EGD) WITH PROPOFOL;  Surgeon: Carol Ada, MD;  Location: [facility]OS[facility];  Service: Endoscopy;  Laterality: N/A;  . EUS  11/18/2015   Procedure: UPPER ENDOSCOPIC ULTRASOUND (EUS) LINEAR;  Surgeon: Carol Ada, MD;  Location: [place];  Service: Endoscopy;;  . INGUINAL HERNIA REPAIR Left   . IR CHOLANGIOGRAM EXISTING TUBE  04/15/2017  . IR CONVERT BILIARY DRAIN TO INT EXT BILIARY DRAIN  03/20/2017  . IR EXCHANGE BILIARY DRAIN  02/12/2017  . IR EXCHANGE BILIARY DRAIN  03/12/2017  . IR EXCHANGE BILIARY DRAIN  05/10/2017  . IR GENERIC HISTORICAL  10/16/2016   IR BILIARY DRAIN PLACEMENT WITH CHOLANGIOGRAM 10/16/2016 Marybelle Killings, MD [facility]  . IR GENERIC HISTORICAL  11/07/2016   IR EXCHANGE BILIARY DRAIN 11/07/2016 Arne Cleveland, MD [facility]  . IR GENERIC HISTORICAL  11/12/2016   IR PATIENT EVAL TECH 0-60 MINS 11/12/2016 Arne Cleveland, MD [facility]  . IR  PARACENTESIS  05/28/2017  . SHOULDER ARTHROSCOPY W/ ROTATOR CUFF REPAIR Left 2014  . TONSILLECTOMY    . WISDOM TOOTH EXTRACTION      Family History  Problem Relation Age of Onset  . Diabetes Mother   . Hypertension Mother   . Cancer Father        lung 43  . Hypertension Father   . Cancer Brother         pancreatic   . Diabetes Brother   . Cancer Paternal Grandfather        colon  . Cancer Paternal Uncle        prostate    Social History:  reports that he has never smoked. He has never used smokeless tobacco. He reports that he does not drink alcohol or use drugs.  Allergies:  Allergies  Allergen Reactions  . Codeine Nausea Only    Medications:  Scheduled: . insulin aspart  0-5 Units Subcutaneous QHS  . insulin aspart  0-9 Units Subcutaneous TID WC  . insulin detemir  25 Units Subcutaneous Daily  . levothyroxine  25 mcg Oral QAC breakfast  . lidocaine      . lipase/protease/amylase  36,000 Units Oral TID AC   Continuous: . piperacillin-tazobactam (ZOSYN)  IV 3.375 g (06/03/17 1510)  . vancomycin Stopped (06/03/17 1610)    Results for orders placed or performed during the hospital encounter of 06/03/17 (from the past 24 hour(s))  CBC with Differential     Status: Abnormal   Collection Time: 06/03/17  2:10 AM  Result Value Ref Range   WBC 32.8 (H) 4.0 - 10.5 K/uL   RBC 3.04 (L) 4.22 - 5.81 MIL/uL   Hemoglobin 9.2 (L) 13.0 - 17.0 g/dL   HCT 26.5 (L) 39.0 - 52.0 %   MCV 87.2 78.0 - 100.0 fL   MCH 30.3 26.0 - 34.0 pg   MCHC 34.7 30.0 - 36.0 g/dL   RDW 17.3 (H) 11.5 - 15.5 %   Platelets 407 (H) 150 - 400 K/uL   Neutrophils Relative % 47 %   Lymphocytes Relative 5 %   Monocytes Relative 11 %   Eosinophils Relative 1 %   Basophils Relative 0 %   Band Neutrophils 35 %   Metamyelocytes Relative 0 %   Myelocytes 0 %   Promyelocytes Absolute 0 %   Blasts 0 %   nRBC 0 0 /100 WBC   Other 1 %   Neutro Abs 26.9 (H) 1.7 - 7.7 K/uL   Lymphs Abs 1.6 0.7 - 4.0 K/uL   Monocytes Absolute 3.6 (H) 0.1 - 1.0 K/uL   Eosinophils Absolute 0.3 0.0 - 0.7 K/uL   Basophils Absolute 0.0 0.0 - 0.1 K/uL   WBC Morphology INCREASED BANDS (>20% BANDS)    Smear Review MORPHOLOGY UNREMARKABLE   Comprehensive metabolic panel     Status: Abnormal   Collection Time: 06/03/17  2:10 AM   Result Value Ref Range   Sodium 128 (L) 135 - 145 mmol/L   Potassium 5.2 (H) 3.5 - 5.1 mmol/L   Chloride 93 (L) 101 - 111 mmol/L   CO2 23 22 - 32 mmol/L   Glucose, Bld 153 (H) 65 - 99 mg/dL   BUN 20 6 - 20 mg/dL   Creatinine, Ser 1.04 0.61 - 1.24 mg/dL   Calcium 8.2 (L) 8.9 - 10.3 mg/dL   Total Protein 6.5 6.5 - 8.1 g/dL   Albumin 1.7 (L) 3.5 - 5.0 g/dL   AST 72 (H) 15 -  41 U/L   ALT 43 17 - 63 U/L   Alkaline Phosphatase 1,305 (H) 38 - 126 U/L   Total Bilirubin 4.7 (H) 0.3 - 1.2 mg/dL   GFR calc non Af Amer >60 >60 mL/min   GFR calc Af Amer >60 >60 mL/min   Anion gap 12 5 - 15  Ammonia     Status: Abnormal   Collection Time: 06/03/17  2:10 AM  Result Value Ref Range   Ammonia 41 (H) 9 - 35 umol/L  Protime-INR     Status: Abnormal   Collection Time: 06/03/17  2:10 AM  Result Value Ref Range   Prothrombin Time 16.5 (H) 11.4 - 15.2 seconds   INR 1.32   APTT     Status: Abnormal   Collection Time: 06/03/17  2:10 AM  Result Value Ref Range   aPTT 41 (H) 24 - 36 seconds  Urinalysis, Routine w reflex microscopic     Status: Abnormal   Collection Time: 06/03/17  3:15 AM  Result Value Ref Range   Color, Urine AMBER (A) YELLOW   APPearance CLEAR CLEAR   Specific Gravity, Urine 1.017 1.005 - 1.030   pH 5.0 5.0 - 8.0   Glucose, UA NEGATIVE NEGATIVE mg/dL   Hgb urine dipstick NEGATIVE NEGATIVE   Bilirubin Urine SMALL (A) NEGATIVE   Ketones, ur NEGATIVE NEGATIVE mg/dL   Protein, ur NEGATIVE NEGATIVE mg/dL   Nitrite NEGATIVE NEGATIVE   Leukocytes, UA NEGATIVE NEGATIVE  I-Stat CG4 Lactic Acid, ED  (not at  [facility])     Status: Abnormal   Collection Time: 06/03/17  4:13 AM  Result Value Ref Range   Lactic Acid, Venous 3.25 (HH) 0.5 - 1.9 mmol/L   Comment NOTIFIED PHYSICIAN   Glucose, capillary     Status: Abnormal   Collection Time: 06/03/17  6:34 AM  Result Value Ref Range   Glucose-Capillary 146 (H) 65 - 99 mg/dL  Lactic acid, plasma     Status: Abnormal   Collection Time:  06/03/17  7:38 AM  Result Value Ref Range   Lactic Acid, Venous 2.1 (HH) 0.5 - 1.9 mmol/L  Body fluid cell count with differential     Status: Abnormal   Collection Time: 06/03/17 11:51 AM  Result Value Ref Range   Fluid Type-FCT Peritoneal    Color, Fluid YELLOW (A) YELLOW   Appearance, Fluid CLOUDY (A) CLEAR   WBC, Fluid 2,928 (H) 0 - 1,000 cu mm   Neutrophil Count, Fluid 60 (H) 0 - 25 %   Lymphs, Fluid 21 %   Monocyte-Macrophage-Serous Fluid 19 (L) 50 - 90 %   Other Cells, Fluid CORRELATE WITH CYTOLOGY. %  Albumin, pleural or peritoneal fluid     Status: None   Collection Time: 06/03/17 11:51 AM  Result Value Ref Range   Albumin, Fluid <1.0 g/dL   Fluid Type-FALB Peritoneal   Gram stain     Status: None   Collection Time: 06/03/17 11:51 AM  Result Value Ref Range   Specimen Description FLUID PERITONEAL    Special Requests NONE    Gram Stain      ABUNDANT WBC PRESENT,BOTH PMN AND MONONUCLEAR NO ORGANISMS SEEN Performed at [facility], [address], [place], [place] [address]    Report Status 06/03/2017 FINAL   Glucose, capillary     Status: Abnormal   Collection Time: 06/03/17 12:54 PM  Result Value Ref Range   Glucose-Capillary 191 (H) 65 - 99 mg/dL  Ct Head Wo Contrast  Result Date: 06/03/2017 CLINICAL DATA:  Acute onset of confusion and slurred speech. Hypoglycemia. Generalized weakness. Expressive aphasia. Initial encounter. EXAM: CT HEAD WITHOUT CONTRAST TECHNIQUE: Contiguous axial images were obtained from the base of the skull through the vertex without intravenous contrast. COMPARISON:  None. FINDINGS: Brain: No evidence of acute infarction, hemorrhage, hydrocephalus, extra-axial collection or mass lesion/mass effect. Mild periventricular white matter change likely reflects small vessel ischemic microangiopathy. The posterior fossa, including the cerebellum, brainstem and fourth ventricle, is within normal limits. The third and lateral ventricles, and  basal ganglia are unremarkable in appearance. The cerebral hemispheres are symmetric in appearance, with normal gray-white differentiation. No mass effect or midline shift is seen. Vascular: No hyperdense vessel or unexpected calcification. Skull: There is no evidence of fracture; visualized osseous structures are unremarkable in appearance. Sinuses/Orbits: The orbits are within normal limits. The paranasal sinuses and mastoid air cells are well-aerated. Other: No significant soft tissue abnormalities are seen. IMPRESSION: 1. No acute intracranial pathology seen on CT. 2. Mild small vessel ischemic microangiopathy. Electronically Signed   By: Garald Balding M.D.   On: 06/03/2017 04:12   Ct Angio Chest Pe W And/or Wo Contrast  Result Date: 06/03/2017 CLINICAL DATA:  71 y/o M; pancreatic cancer with Mets to the liver with fluid removed from the abdomen last week. Recurrent abdominal distention, orthopnea, ongoing chemotherapy, elevated bilirubin, chest pain, and shortness of breath. EXAM: CT ANGIOGRAPHY CHEST CT ABDOMEN AND PELVIS WITH CONTRAST TECHNIQUE: Multidetector CT imaging of the chest was performed using the standard protocol during bolus administration of intravenous contrast. Multiplanar CT image reconstructions and MIPs were obtained to evaluate the vascular anatomy. Multidetector CT imaging of the abdomen and pelvis was performed using the standard protocol during bolus administration of intravenous contrast. CONTRAST:  100 cc Isovue 370 COMPARISON:  None. FINDINGS: CTA CHEST FINDINGS Cardiovascular: Satisfactory opacification of the pulmonary arteries to the segmental level. No evidence of pulmonary embolism. Mild cardiomegaly. Severe coronary artery calcification coronary artery calcification. Normal caliber thoracic aorta with mild calcific atherosclerosis. Mediastinum/Nodes: Prominent subcentimeter mediastinal lymph nodes, possibly reactive or due to edema. Normal thoracic esophagus. Normal thyroid  gland. Lungs/Pleura: Trace right and moderate left pleural effusions. Calcified plaque along the right lateral chest wall, probably related to prior asbestos exposure. Partial atelectasis of lower lobes bilaterally likely secondary pleural effusions. No pulmonary metastatic disease identified. Musculoskeletal: No chest wall abnormality. No acute or significant osseous findings. Review of the MIP images confirms the above findings. CT ABDOMEN and PELVIS FINDINGS Hepatobiliary: Numerous liver metastasis are present, some of which are overall stable to mildly decreased in size in comparison to prior CT of the abdomen and pelvis, for example the previous dominant lesion in segment 4B now measures 2.0 cm (series 12, image 25). Normal gallbladder. Transhepatic common bile duct catheter and common bile duct stent in situ unchanged in position in comparison with the prior CT of abdomen and pelvis. Pain main portal vein and superior mesenteric vein. Stable occlusion of the splenic vein. Pancreas: Ill-defined mass in head of pancreas is stable. Atrophy of pancreatic body and tail with main duct dilatation is stable. Interval development of irregular rim enhancing masslike lesions within the lesser sac extending over the body of the stomach into the left upper quadrant. Lesions in the lesser sac measure 4.1 x 2.6 cm and 5.1 x 3.3 cm in the axial plane (series 12, image 32) and anterior to the gastric body the lesion measures up to 6.7  x 4.9 cm in the axial plane (series 12, image 21). Spleen: Normal in size without focal abnormality. Adrenals/Urinary Tract: No focal kidney lesion, or hydronephrosis is identified. Normal appearance of the bladder. Bilateral nonspecific adrenal hypertrophy. Stomach/Bowel: Stent within the gastric antrum extending into first and second portions of the duodenum. Irregular soft tissue thickening within the walls of gastric antrum, lesser curvature of the stomach, and proximal duodenum has worsened  in comparison with the prior CT of abdomen and pelvis. Large volume of stool throughout the colon. No apparent obstructive or inflammatory changes of the small and large bowel. Vascular/Lymphatic: Severe calcification of the abdominal aorta. Prominent stable upper retroperitoneal lymph nodes and periportal lymph nodes. Reproductive: Negative. Other: Large volume of ascites is much increased in comparison with the prior CT of abdomen and pelvis. Musculoskeletal: With Review of the MIP images confirms the above findings. IMPRESSION: CTA chest: 1. No pulmonary embolus identified. 2. Low lung volumes likely due to large volume ascites. 3. Trace right and moderate left pleural effusion. 4. Partial compressive atelectasis of lower lobes. 5. Mild cardiomegaly and severe coronary artery calcification. CT abdomen and pelvis: 1. Interval development of irregular rim enhancing masslike lesions within the lesser sac along lesser curvature of stomach and extending anterior to the gastric body into the left upper quadrant measuring up to 6.7 cm in the axial plane. Differential includes interval development of necrotic metastasis, acute peripancreatic collections, and/or abscess. 2. Worsening wall thickening along gastric antrum and proximal duodenum which may represent local progression of disease or superimposed inflammation. 3. Liver metastasis are stable to mildly decreased in size. 4. Large volume of ascites is increased from prior CT. Electronically Signed   By: Kristine Garbe M.D.   On: 06/03/2017 04:22   Ct Abdomen Pelvis W Contrast  Result Date: 06/03/2017 CLINICAL DATA:  71 y/o M; pancreatic cancer with Mets to the liver with fluid removed from the abdomen last week. Recurrent abdominal distention, orthopnea, ongoing chemotherapy, elevated bilirubin, chest pain, and shortness of breath. EXAM: CT ANGIOGRAPHY CHEST CT ABDOMEN AND PELVIS WITH CONTRAST TECHNIQUE: Multidetector CT imaging of the chest was  performed using the standard protocol during bolus administration of intravenous contrast. Multiplanar CT image reconstructions and MIPs were obtained to evaluate the vascular anatomy. Multidetector CT imaging of the abdomen and pelvis was performed using the standard protocol during bolus administration of intravenous contrast. CONTRAST:  100 cc Isovue 370 COMPARISON:  None. FINDINGS: CTA CHEST FINDINGS Cardiovascular: Satisfactory opacification of the pulmonary arteries to the segmental level. No evidence of pulmonary embolism. Mild cardiomegaly. Severe coronary artery calcification coronary artery calcification. Normal caliber thoracic aorta with mild calcific atherosclerosis. Mediastinum/Nodes: Prominent subcentimeter mediastinal lymph nodes, possibly reactive or due to edema. Normal thoracic esophagus. Normal thyroid gland. Lungs/Pleura: Trace right and moderate left pleural effusions. Calcified plaque along the right lateral chest wall, probably related to prior asbestos exposure. Partial atelectasis of lower lobes bilaterally likely secondary pleural effusions. No pulmonary metastatic disease identified. Musculoskeletal: No chest wall abnormality. No acute or significant osseous findings. Review of the MIP images confirms the above findings. CT ABDOMEN and PELVIS FINDINGS Hepatobiliary: Numerous liver metastasis are present, some of which are overall stable to mildly decreased in size in comparison to prior CT of the abdomen and pelvis, for example the previous dominant lesion in segment 4B now measures 2.0 cm (series 12, image 25). Normal gallbladder. Transhepatic common bile duct catheter and common bile duct stent in situ unchanged in position in comparison with the  prior CT of abdomen and pelvis. Pain main portal vein and superior mesenteric vein. Stable occlusion of the splenic vein. Pancreas: Ill-defined mass in head of pancreas is stable. Atrophy of pancreatic body and tail with main duct dilatation is  stable. Interval development of irregular rim enhancing masslike lesions within the lesser sac extending over the body of the stomach into the left upper quadrant. Lesions in the lesser sac measure 4.1 x 2.6 cm and 5.1 x 3.3 cm in the axial plane (series 12, image 32) and anterior to the gastric body the lesion measures up to 6.7 x 4.9 cm in the axial plane (series 12, image 21). Spleen: Normal in size without focal abnormality. Adrenals/Urinary Tract: No focal kidney lesion, or hydronephrosis is identified. Normal appearance of the bladder. Bilateral nonspecific adrenal hypertrophy. Stomach/Bowel: Stent within the gastric antrum extending into first and second portions of the duodenum. Irregular soft tissue thickening within the walls of gastric antrum, lesser curvature of the stomach, and proximal duodenum has worsened in comparison with the prior CT of abdomen and pelvis. Large volume of stool throughout the colon. No apparent obstructive or inflammatory changes of the small and large bowel. Vascular/Lymphatic: Severe calcification of the abdominal aorta. Prominent stable upper retroperitoneal lymph nodes and periportal lymph nodes. Reproductive: Negative. Other: Large volume of ascites is much increased in comparison with the prior CT of abdomen and pelvis. Musculoskeletal: With Review of the MIP images confirms the above findings. IMPRESSION: CTA chest: 1. No pulmonary embolus identified. 2. Low lung volumes likely due to large volume ascites. 3. Trace right and moderate left pleural effusion. 4. Partial compressive atelectasis of lower lobes. 5. Mild cardiomegaly and severe coronary artery calcification. CT abdomen and pelvis: 1. Interval development of irregular rim enhancing masslike lesions within the lesser sac along lesser curvature of stomach and extending anterior to the gastric body into the left upper quadrant measuring up to 6.7 cm in the axial plane. Differential includes interval development of  necrotic metastasis, acute peripancreatic collections, and/or abscess. 2. Worsening wall thickening along gastric antrum and proximal duodenum which may represent local progression of disease or superimposed inflammation. 3. Liver metastasis are stable to mildly decreased in size. 4. Large volume of ascites is increased from prior CT. Electronically Signed   By: Kristine Garbe M.D.   On: 06/03/2017 04:22   US Paracentesis  Result Date: 06/03/2017 INDICATION: Stage IV pancreatic cancer, recurrent ascites. Request made for diagnostic and therapeutic paracentesis. EXAM: ULTRASOUND GUIDED DIAGNOSTIC AND THERAPEUTIC PARACENTESIS MEDICATIONS: None. COMPLICATIONS: None immediate. PROCEDURE: Informed written consent was obtained from the patient after a discussion of the risks, benefits and alternatives to treatment. A timeout was performed prior to the initiation of the procedure. Initial ultrasound scanning demonstrates a moderate amount of ascites within the left lower abdominal quadrant. The left lower abdomen was prepped and draped in the usual sterile fashion. 1% lidocaine was used for local anesthesia. Following this, a Yueh catheter was introduced. An ultrasound image was saved for documentation purposes. The paracentesis was performed. The catheter was removed and a dressing was applied. The patient tolerated the procedure well without immediate post procedural complication. FINDINGS: A total of approximately 3 liters of slightly hazy, yellow fluid was removed. Samples were sent to the laboratory as requested by the clinical team. IMPRESSION: Successful ultrasound-guided diagnostic and therapeutic paracentesis yielding 3 liters of peritoneal fluid. Read by: Rowe Rahkeem, PA-C Electronically Signed   By: Sandi Mariscal M.D.   On: 06/03/2017 12:19  ROS:  As stated above in the HPI otherwise negative.  Blood pressure 121/65, pulse 83, temperature 97.7 F (36.5 C), temperature source Oral, resp. rate  15, height 6' (1.829 m), weight 80.2 kg (176 lb 12.9 oz), SpO2 95 %.    PE: Gen: NAD, Alert and Oriented, weak HEENT:  [place]/AT, EOMI, scleral icterus Lungs: CTA Bilaterally CV: RRR without M/G/R ABM: Soft Ext: mild edema  Assessment/Plan: 1) Metastatic pancreatic cancer. 2) Malignant ascites. 3) Obstructive jaundice.   I spoke with Dr. [name] as Friday about Mr. Delvecchio.  He has not been able to receive any further chemotherapy as his bilirubin remains elevated and he continues to have septic episodes.  I feel that the sepsis is secondary to the metastatic lesions in his liver and there is obstruction in his smaller ducts.  New lesions were identified around the lesser sac of the stomach.  Unfortunately I cannot offer any further therapy to improve his clinical status.  This is a difficult situation as I know the patient and his wife very well during this time that I have been involved in his care.  Plan: 1) Hospice care.  He is willing to undergo inpatient hospice, which I think is the best option with his ascites. 2) I will discuss with radiology to see about placing a port to allow for PRN drainage rather than scheduling drainage with IR.  He will reaccumulate very quickly with his malignant ascites.    Cali Cuartas D 06/03/2017, 5:41 PM

## 2017-06-03 NOTE — Consult Note (Signed)
Consultation Note Date: 06/03/2017   Patient Name: Jacob Hardin  DOB: [birth year]  MRN: [id number]  Age / Sex: 71 y.o., male  PCP: [name], MD Referring Physician: Patrecia Pour, MD  Reason for Consultation: Establishing goals of care and Pain control  HPI/Patient Profile: 71 y.o. male  with past medical history of  metastatic pancreatic cancer on gemcitabine with complicated obstructive jaundice, external biliary drain and also stenting as recently as last month, HTN, and IDDM who presents with malaise worsening over the last week. Now admitted on 06/03/2017   Clinical Assessment and Goals of Care:  71 yo gentleman with history as noted above, with ongoing functional decline and high symptom burden. Patient unfortunately has had recurrent bacteremia since his duodenal stent placement in June 2018. Patient has required hospitalizations for fever sepsis, positive blood cultures, has been on extensive courses of antibiotics from June up until this current hospitalization.  Patient was admitted to hospital medicine service with presumed sepsis, obstructive jaundice, worsening ascites, ongoing hyponatremia. At this time, a multifactorial etiology for his sepsis is being considered: Cholangitis versus spontaneous bacterial peritonitis versus questionable intra-abdominal infection.  A palliative consultation has been requested for goals of care discussions.  Patient is a visibly icteric gentleman sitting up by the edge of the bed. He complains of feeling short of breath. He has generalized abdominal discomfort and back discomfort. His wife in past or present at the bedside. I introduced myself and palliative care as follows: Palliative medicine is specialized medical care for people living with serious illness. It focuses on providing relief from the symptoms and stress of a serious illness. The goal is to  improve quality of life for both the patient and the family.  Patient has hydrocodone at home. He complains of abdominal discomfort and shortness of breath. Wife is asking about when he will be taken for his paracenteses. We discussed initially about appropriate symptom management. We will half available IV hydromorphone on an as-needed basis for assistance with pain and shortness of breath management. We have briefly discussed about the advancing nature of his malignancy. She discussions and recommendations below. We will continue to follow along.  NEXT OF KIN  wife   SUMMARY OF RECOMMENDATIONS    Agree with DNR.  Add low dose IV Dilaudid PRN for pain and dyspnea, patient with high symptom burden from ascites and underlying malignancy.  Brief goals of care discussions held, patient and wife wish to see GI and medical oncology again, they are hopeful that the patient will feel better after his paracentesis, so as to be able to participate well in goals of care discussions.   PMT will continue to follow and help address goals of care and assist with appropriate decision making.   Thank you for the consult.   Code Status/Advance Care Planning:  DNR    Symptom Management:    see above   Palliative Prophylaxis:   Delirium Protocol   Psycho-social/Spiritual:   Desire for further Chaplaincy support: yes, patient is  being supported by his own pastor.   Additional Recommendations: Education on Hospice  Prognosis:   Guarded, patient is hospice eligible, in my opinion.   Discharge Planning: To Be Determined      Primary Diagnoses: Present on Admission: . Sepsis ([facility]) . Biliary obstruction . Cancer of head of pancreas ([place]) . Hypertension . Malnutrition of moderate degree . Hyponatremia . Normocytic anemia   I have reviewed the medical record, interviewed the patient and family, and examined the patient. The following aspects are pertinent.  Past Medical History:    Diagnosis Date  . Elevated liver enzymes   . GERD (gastroesophageal reflux disease)   . Hyperlipidemia   . Hypertension    recently weight lost-no meds now-running low.  . Obstructive jaundice   . Oxalate nephropathy   . PONV (postoperative nausea and vomiting)   . Primary pancreatic adenocarcinoma ([facility])    mets to liver and duodenum  . Pruritus   . Restless legs   . Skin cancer    "burned off my face & head"  . Type II diabetes mellitus ([place])   . Weight loss    Social History   Social History  . Marital status: Married    Spouse name: Butch Penny  . Number of children: 0  . Years of education: N/A   Occupational History  . retired     Pharmacist, hospital   Social History Main Topics  . Smoking status: Never Smoker  . Smokeless tobacco: Never Used  . Alcohol use No  . Drug use: No  . Sexual activity: Yes    Birth control/ protection: None     Comment: married to [place].  Retired Corporate investment banker   Other Topics Concern  . None   Social History Narrative   Married, wife Butch Penny   No children   Retired Art therapist   Family History  Problem Relation Age of Onset  . Diabetes Mother   . Hypertension Mother   . Cancer Father        lung 31  . Hypertension Father   . Cancer Brother        pancreatic   . Diabetes Brother   . Cancer Paternal Grandfather        colon  . Cancer Paternal Uncle        prostate   Scheduled Meds: . feeding supplement (ENSURE ENLIVE)  237 mL Oral BID BM  . insulin aspart  0-5 Units Subcutaneous QHS  . insulin aspart  0-9 Units Subcutaneous TID WC  . insulin detemir  25 Units Subcutaneous Daily  . iopamidol      . levothyroxine  25 mcg Oral QAC breakfast  . lidocaine-EPINEPHrine      . lipase/protease/amylase  36,000 Units Oral TID AC   Continuous Infusions: . sodium chloride 75 mL/hr at 06/03/17 0630  . piperacillin-tazobactam (ZOSYN)  IV 3.375 g (06/03/17 0927)  . vancomycin     PRN Meds:.acetaminophen, ALPRAZolam,  baclofen, HYDROcodone-acetaminophen, HYDROmorphone (DILAUDID) injection, ipratropium-albuterol, ondansetron **OR** ondansetron (ZOFRAN) IV, polyethylene glycol Medications Prior to Admission:  Prior to Admission medications   Medication Sig Start Date End Date Taking? Authorizing Provider  acetaminophen (TYLENOL) 500 MG tablet Take 1,000 mg by mouth every 6 (six) hours as needed.   Yes [provider]  ALPRAZolam (XANAX) 0.25 MG tablet Take 1 tablet (0.25 mg total) by mouth at bedtime as needed for anxiety or sleep. 05/27/17  Yes Owens Shark, NP  baclofen (LIORESAL) 10 MG  tablet Take 5 mg by mouth 3 (three) times daily as needed (for hiccups).   Yes [provider]  BD PEN NEEDLE NANO U/F 32G X 4 MM MISC USE TO INJECT INSULIN TWICE A DAY 11/06/16  Yes [name], MD  HYDROcodone-acetaminophen (NORCO) 5-325 MG tablet Take 1-2 tablets by mouth every 6 (six) hours as needed for moderate pain. 05/27/17  Yes Owens Shark, NP  ibuprofen (ADVIL,MOTRIN) 200 MG tablet Take 400 mg by mouth every 6 (six) hours as needed for headache, mild pain or moderate pain.    Yes [provider]  insulin aspart (NOVOLOG FLEXPEN) 100 UNIT/ML FlexPen Inject 4 Units into the skin daily before supper.   Yes [provider]  Insulin Detemir (LEVEMIR FLEXTOUCH) 100 UNIT/ML Pen Inject 25 Units into the skin daily. 04/16/17  Yes Rosita Fire, MD  levothyroxine (SYNTHROID, LEVOTHROID) 25 MCG tablet Take 1 tablet (25 mcg total) by mouth daily before breakfast. 05/27/17  Yes Owens Shark, NP  lidocaine-prilocaine (EMLA) cream Apply 1 application topically as needed (prior to accessing port). 04/22/17  Yes Ladell Pier, MD  lipase/protease/amylase (CREON) 36000 UNITS CPEP capsule Take 1 capsule (36,000 Units total) by mouth 3 (three) times daily before meals. 02/22/17  Yes Ladell Pier, MD  loratadine (CLARITIN) 10 MG tablet Take 10 mg by mouth daily.    Yes [provider]  Multiple Vitamin (MULTIVITAMIN WITH MINERALS) TABS tablet Take 1 tablet by mouth daily.   Yes [provider]  ondansetron (ZOFRAN) 8 MG tablet Take 1 tablet (8 mg total) by mouth every 8 (eight) hours as needed for nausea or vomiting. 05/27/17  Yes Owens Shark, NP  ONE TOUCH ULTRA TEST test strip TEST TWICE A DAY 04/22/17  Yes [name], MD  penicillin v potassium (VEETID) 500 MG tablet Take 1 tablet (500 mg total) by mouth 4 (four) times daily. 05/30/17  Yes Ladell Pier, MD  polyethylene glycol (MIRALAX / GLYCOLAX) packet Take 17 g by mouth daily as needed for mild constipation.    Yes [provider]  prochlorperazine (COMPAZINE) 10 MG tablet Take 1 tablet (10 mg total) by mouth every 6 (six) hours as needed for nausea or vomiting. 03/05/17  Yes Owens Shark, NP  traMADol (ULTRAM) 50 MG tablet Take 1 tablet (50 mg total) by mouth every 6 (six) hours as needed. Patient taking differently: Take 50 mg by mouth every 6 (six) hours as needed for moderate pain or severe pain.  05/27/17  Yes Owens Shark, NP   Allergies  Allergen Reactions  . Codeine Nausea Only   Review of Systems Positive for abdominal pain positive for shortness of breath Physical Exam Older than stated age appearing, visibly icteric appearing gentleman S1-S2 Diminished breath sounds Abdomen distended Trace edema Awake alert nonfocal  Vital Signs: BP 134/77 (BP Location: Right Arm)   Pulse 70   Temp 97.8 F (36.6 C) (Oral)   Resp 16   Ht 6' (1.829 m)   Wt 80.2 kg (176 lb 12.9 oz)   SpO2 93%   BMI 23.98 kg/m  Pain Assessment: 0-10   Pain Score: 0-No pain   SpO2: SpO2: 93 % O2 Device:SpO2: 93 % O2 Flow Rate: .O2 Flow Rate (L/min): 2 L/min  IO: Intake/output summary:  Intake/Output Summary (Last 24 hours) at 06/03/17 0942 Last data filed at 06/03/17 0630  Gross per 24 hour  Intake  0 ml  Output                0 ml  Net                0 ml    LBM: Last BM  Date: 06/03/17 Baseline Weight: Weight: 75.3 kg (166 lb) Most recent weight: Weight: 80.2 kg (176 lb 12.9 oz)     Palliative Assessment/Data:   Flowsheet Rows     Most Recent Value  Intake Tab  Referral Department  Hospitalist  Unit at Time of Referral  Oncology Unit  Palliative Care Primary Diagnosis  Cancer  Palliative Care Type  New Palliative care  Reason for referral  Pain, Clarify Goals of Care  Date first seen by Palliative Care  06/03/17  Clinical Assessment  Palliative Performance Scale Score  30%  Pain Max last 24 hours  6  Pain Min Last 24 hours  5  Dyspnea Max Last 24 Hours  6  Dyspnea Min Last 24 hours  5  Nausea Max Last 24 Hours  3  Nausea Min Last 24 Hours  2  Anxiety Max Last 24 Hours  5  Anxiety Min Last 24 Hours  4  Psychosocial & Spiritual Assessment  Palliative Care Outcomes  Patient/Family meeting held?  Yes  Who was at the meeting?  wife patient pastor   Palliative Care Outcomes  Improved pain interventions, Clarified goals of care      Time In:  8.30 Time Out:  9.30 Time Total:  60 min  Greater than 50%  of this time was spent counseling and coordinating care related to the above assessment and plan.  Signed by: Loistine Chance, MD  [phone]  Please contact Palliative Medicine Team phone at [phone] for questions and concerns.  For individual provider: See Shea Evans

## 2017-06-03 NOTE — ED Notes (Addendum)
Call Melissa, RN with report at 05:40, [phone]

## 2017-06-03 NOTE — Progress Notes (Signed)
IP PROGRESS NOTE  Subjective:   Mr. Fielden is well-known to me with a history metastatic pancreas cancer. He is completing a course of antibiotics for treatment of Lactobacillus bacteremia diagnosed 2 weeks ago. He was admitted this morning with upper abdominal pain and dyspnea. He denies recurrent fever. He reports anxiety related to dyspnea. A CT of the chest and abdomen revealed increased ascites and bilateral pleural effusions. No evidence of pulmonary embolism. There is a new mass adjacent to the stomach.  Objective: Vital signs in last 24 hours: Blood pressure 134/77, pulse 70, temperature 97.8 F (36.6 C), temperature source Oral, resp. rate 16, height 6' (1.829 m), weight 176 lb 12.9 oz (80.2 kg), SpO2 93 %.  Intake/Output from previous day: No intake/output data recorded.  Physical Exam:  HEENT: Scleral icterus Lungs: Decreased breath sounds at the left greater than right lower chest Cardiac: Regular rate and rhythm Abdomen: Markedly distended, no mass Extremities: Pitting edema at the lower leg bilaterally   Portacath/PICC-without erythema  Lab Results:  Recent Labs  06/03/17 0210  WBC 32.8*  HGB 9.2*  HCT 26.5*  PLT 407*    BMET  Recent Labs  06/03/17 0210  NA 128*  K 5.2*  CL 93*  CO2 23  GLUCOSE 153*  BUN 20  CREATININE 1.04  CALCIUM 8.2*    No results found for: CEA1  Studies/Results: Ct Head Wo Contrast  Result Date: 06/03/2017 CLINICAL DATA:  Acute onset of confusion and slurred speech. Hypoglycemia. Generalized weakness. Expressive aphasia. Initial encounter. EXAM: CT HEAD WITHOUT CONTRAST TECHNIQUE: Contiguous axial images were obtained from the base of the skull through the vertex without intravenous contrast. COMPARISON:  None. FINDINGS: Brain: No evidence of acute infarction, hemorrhage, hydrocephalus, extra-axial collection or mass lesion/mass effect. Mild periventricular white matter change likely reflects small vessel ischemic  microangiopathy. The posterior fossa, including the cerebellum, brainstem and fourth ventricle, is within normal limits. The third and lateral ventricles, and basal ganglia are unremarkable in appearance. The cerebral hemispheres are symmetric in appearance, with normal gray-white differentiation. No mass effect or midline shift is seen. Vascular: No hyperdense vessel or unexpected calcification. Skull: There is no evidence of fracture; visualized osseous structures are unremarkable in appearance. Sinuses/Orbits: The orbits are within normal limits. The paranasal sinuses and mastoid air cells are well-aerated. Other: No significant soft tissue abnormalities are seen. IMPRESSION: 1. No acute intracranial pathology seen on CT. 2. Mild small vessel ischemic microangiopathy. Electronically Signed   By: Garald Balding M.D.   On: 06/03/2017 04:12   Ct Angio Chest Pe W And/or Wo Contrast  Result Date: 06/03/2017 CLINICAL DATA:  71 y/o M; pancreatic cancer with Mets to the liver with fluid removed from the abdomen last week. Recurrent abdominal distention, orthopnea, ongoing chemotherapy, elevated bilirubin, chest pain, and shortness of breath. EXAM: CT ANGIOGRAPHY CHEST CT ABDOMEN AND PELVIS WITH CONTRAST TECHNIQUE: Multidetector CT imaging of the chest was performed using the standard protocol during bolus administration of intravenous contrast. Multiplanar CT image reconstructions and MIPs were obtained to evaluate the vascular anatomy. Multidetector CT imaging of the abdomen and pelvis was performed using the standard protocol during bolus administration of intravenous contrast. CONTRAST:  100 cc Isovue 370 COMPARISON:  None. FINDINGS: CTA CHEST FINDINGS Cardiovascular: Satisfactory opacification of the pulmonary arteries to the segmental level. No evidence of pulmonary embolism. Mild cardiomegaly. Severe coronary artery calcification coronary artery calcification. Normal caliber thoracic aorta with mild calcific  atherosclerosis. Mediastinum/Nodes: Prominent subcentimeter mediastinal lymph nodes, possibly reactive  or due to edema. Normal thoracic esophagus. Normal thyroid gland. Lungs/Pleura: Trace right and moderate left pleural effusions. Calcified plaque along the right lateral chest wall, probably related to prior asbestos exposure. Partial atelectasis of lower lobes bilaterally likely secondary pleural effusions. No pulmonary metastatic disease identified. Musculoskeletal: No chest wall abnormality. No acute or significant osseous findings. Review of the MIP images confirms the above findings. CT ABDOMEN and PELVIS FINDINGS Hepatobiliary: Numerous liver metastasis are present, some of which are overall stable to mildly decreased in size in comparison to prior CT of the abdomen and pelvis, for example the previous dominant lesion in segment 4B now measures 2.0 cm (series 12, image 25). Normal gallbladder. Transhepatic common bile duct catheter and common bile duct stent in situ unchanged in position in comparison with the prior CT of abdomen and pelvis. Pain main portal vein and superior mesenteric vein. Stable occlusion of the splenic vein. Pancreas: Ill-defined mass in head of pancreas is stable. Atrophy of pancreatic body and tail with main duct dilatation is stable. Interval development of irregular rim enhancing masslike lesions within the lesser sac extending over the body of the stomach into the left upper quadrant. Lesions in the lesser sac measure 4.1 x 2.6 cm and 5.1 x 3.3 cm in the axial plane (series 12, image 32) and anterior to the gastric body the lesion measures up to 6.7 x 4.9 cm in the axial plane (series 12, image 21). Spleen: Normal in size without focal abnormality. Adrenals/Urinary Tract: No focal kidney lesion, or hydronephrosis is identified. Normal appearance of the bladder. Bilateral nonspecific adrenal hypertrophy. Stomach/Bowel: Stent within the gastric antrum extending into first and second  portions of the duodenum. Irregular soft tissue thickening within the walls of gastric antrum, lesser curvature of the stomach, and proximal duodenum has worsened in comparison with the prior CT of abdomen and pelvis. Large volume of stool throughout the colon. No apparent obstructive or inflammatory changes of the small and large bowel. Vascular/Lymphatic: Severe calcification of the abdominal aorta. Prominent stable upper retroperitoneal lymph nodes and periportal lymph nodes. Reproductive: Negative. Other: Large volume of ascites is much increased in comparison with the prior CT of abdomen and pelvis. Musculoskeletal: With Review of the MIP images confirms the above findings. IMPRESSION: CTA chest: 1. No pulmonary embolus identified. 2. Low lung volumes likely due to large volume ascites. 3. Trace right and moderate left pleural effusion. 4. Partial compressive atelectasis of lower lobes. 5. Mild cardiomegaly and severe coronary artery calcification. CT abdomen and pelvis: 1. Interval development of irregular rim enhancing masslike lesions within the lesser sac along lesser curvature of stomach and extending anterior to the gastric body into the left upper quadrant measuring up to 6.7 cm in the axial plane. Differential includes interval development of necrotic metastasis, acute peripancreatic collections, and/or abscess. 2. Worsening wall thickening along gastric antrum and proximal duodenum which may represent local progression of disease or superimposed inflammation. 3. Liver metastasis are stable to mildly decreased in size. 4. Large volume of ascites is increased from prior CT. Electronically Signed   By: Kristine Garbe M.D.   On: 06/03/2017 04:22   Ct Abdomen Pelvis W Contrast  Result Date: 06/03/2017 CLINICAL DATA:  71 y/o M; pancreatic cancer with Mets to the liver with fluid removed from the abdomen last week. Recurrent abdominal distention, orthopnea, ongoing chemotherapy, elevated  bilirubin, chest pain, and shortness of breath. EXAM: CT ANGIOGRAPHY CHEST CT ABDOMEN AND PELVIS WITH CONTRAST TECHNIQUE: Multidetector CT imaging of the  chest was performed using the standard protocol during bolus administration of intravenous contrast. Multiplanar CT image reconstructions and MIPs were obtained to evaluate the vascular anatomy. Multidetector CT imaging of the abdomen and pelvis was performed using the standard protocol during bolus administration of intravenous contrast. CONTRAST:  100 cc Isovue 370 COMPARISON:  None. FINDINGS: CTA CHEST FINDINGS Cardiovascular: Satisfactory opacification of the pulmonary arteries to the segmental level. No evidence of pulmonary embolism. Mild cardiomegaly. Severe coronary artery calcification coronary artery calcification. Normal caliber thoracic aorta with mild calcific atherosclerosis. Mediastinum/Nodes: Prominent subcentimeter mediastinal lymph nodes, possibly reactive or due to edema. Normal thoracic esophagus. Normal thyroid gland. Lungs/Pleura: Trace right and moderate left pleural effusions. Calcified plaque along the right lateral chest wall, probably related to prior asbestos exposure. Partial atelectasis of lower lobes bilaterally likely secondary pleural effusions. No pulmonary metastatic disease identified. Musculoskeletal: No chest wall abnormality. No acute or significant osseous findings. Review of the MIP images confirms the above findings. CT ABDOMEN and PELVIS FINDINGS Hepatobiliary: Numerous liver metastasis are present, some of which are overall stable to mildly decreased in size in comparison to prior CT of the abdomen and pelvis, for example the previous dominant lesion in segment 4B now measures 2.0 cm (series 12, image 25). Normal gallbladder. Transhepatic common bile duct catheter and common bile duct stent in situ unchanged in position in comparison with the prior CT of abdomen and pelvis. Pain main portal vein and superior mesenteric  vein. Stable occlusion of the splenic vein. Pancreas: Ill-defined mass in head of pancreas is stable. Atrophy of pancreatic body and tail with main duct dilatation is stable. Interval development of irregular rim enhancing masslike lesions within the lesser sac extending over the body of the stomach into the left upper quadrant. Lesions in the lesser sac measure 4.1 x 2.6 cm and 5.1 x 3.3 cm in the axial plane (series 12, image 32) and anterior to the gastric body the lesion measures up to 6.7 x 4.9 cm in the axial plane (series 12, image 21). Spleen: Normal in size without focal abnormality. Adrenals/Urinary Tract: No focal kidney lesion, or hydronephrosis is identified. Normal appearance of the bladder. Bilateral nonspecific adrenal hypertrophy. Stomach/Bowel: Stent within the gastric antrum extending into first and second portions of the duodenum. Irregular soft tissue thickening within the walls of gastric antrum, lesser curvature of the stomach, and proximal duodenum has worsened in comparison with the prior CT of abdomen and pelvis. Large volume of stool throughout the colon. No apparent obstructive or inflammatory changes of the small and large bowel. Vascular/Lymphatic: Severe calcification of the abdominal aorta. Prominent stable upper retroperitoneal lymph nodes and periportal lymph nodes. Reproductive: Negative. Other: Large volume of ascites is much increased in comparison with the prior CT of abdomen and pelvis. Musculoskeletal: With Review of the MIP images confirms the above findings. IMPRESSION: CTA chest: 1. No pulmonary embolus identified. 2. Low lung volumes likely due to large volume ascites. 3. Trace right and moderate left pleural effusion. 4. Partial compressive atelectasis of lower lobes. 5. Mild cardiomegaly and severe coronary artery calcification. CT abdomen and pelvis: 1. Interval development of irregular rim enhancing masslike lesions within the lesser sac along lesser curvature of  stomach and extending anterior to the gastric body into the left upper quadrant measuring up to 6.7 cm in the axial plane. Differential includes interval development of necrotic metastasis, acute peripancreatic collections, and/or abscess. 2. Worsening wall thickening along gastric antrum and proximal duodenum which may represent local progression of  disease or superimposed inflammation. 3. Liver metastasis are stable to mildly decreased in size. 4. Large volume of ascites is increased from prior CT. Electronically Signed   By: Kristine Garbe M.D.   On: 06/03/2017 04:22    Medications: I have reviewed the patient's current medications.  Assessment/Plan:  1. 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  Cycle 4 FOLFIRINOX 02/01/2016  Cycle 5 FOLFIRINOX 02/15/2016  Cycle 6 FOLFIRINOX 02/29/2016  Cycle 7 FOLFIRINOX 03/14/2016  Cycle 8 FOLFIRINOX 03/28/2016  CT abdomen/pelvis 04/09/2016-improvement in pancreas head mass and liver metastases, no new metastatic disease  Chemotherapy switch to FOLFIRI every 3 weeks beginning 04/11/2016  CT 07/30/2016-improvement in liver metastases, stable pancreas mass   FOLFIRI continued on a 3 week schedule  FOLFIRI 09/19/2016.  Treatment subsequently placed on hold due to biliary obstruction  CT 10/13/2016-no change in liver metastases or pancreas mass  FOLFIRI resumed on a 3 week schedule 01/07/2017  FOLFIRI 01/28/2017  FOLFIRI 02/26/2017  FOLFIRI  03/11/2017 (irinotecan dose reduced secondary to prolonged nausea)  FOLFIRI 04/01/2017  CT 04/12/2017-new and enlarging liver metastases, new soft tissue fullness at the pancreas tail  CT 05/09/2017-progressive liver metastases, increased ascites  Cycle 1 gemcitabine/Abraxane 05/13/2017 (Abraxane held secondary to hyperbilirubinemia)   2. Diabetes  3. History of Anorexia/weight loss secondary to #1  4. Obstructive jaundice secondary to #1-status post placement of a percutaneous internal/external biliary drain 11/25/2015; biliary drainage catheter capped 12/02/2015; biliary drain internalized 12/13/2015  5. Port-A-Cath placement 11/30/2015 interventional radiology  6. Admission 01/17/2016 with a high fever and rigors-no apparent source for infection upon review of his history and examination, blood cultures positive for gram-positive cocci--viridans streptococcus--completed 2 weeks of IV ceftriaxone; no endocarditis on TTE.  7. Oxaliplatin neuropathy  8. Elevated transaminases, alkaline phosphatase, and bilirubin 06/13/2016.06/19/2016 bilirubin improved to normal range; liver enzymes remain elevated.  9. Generalized pruritus,no rash 10/10/2016.Likely secondary to hyperbilirubinemia. Resolved 11/12/2016. Recurrent 12/03/2016.Persistent pruritus.  10. Hospitalization 10/10/2016 through 10/20/2016 with biliary obstruction/sepsis. Status post placement of a biliary drain in interventional radiology 10/16/2016. Status post ERCP 10/26/2016 with biliary stent exchange. There was no bile drainage. Status post cholangiogram and exchange of biliary drain 11/07/2016.Biliary drain capped 11/12/2016.  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 C. difficile colitis 04/26/2018status post course of vancomycin.  14. Delayed nausea following chemotherapy-irinotecan dose reduced and prophylactic Decadron added with chemotherapy 03/11/2017; no significant nausea following chemotherapy 03/11/2017.  15. Admission with Klebsiella bacteremia 06/22/2018and 05/08/2017  16. Blood culture 05/20/2017 positive for Lactobacillus. Vancomycin initiated 05/25/2017 with 2 weeks planned to be followed by indefinite Augmentin.  17. Admission 06/03/2017 with progressive ascites, abdominal pain, and dyspnea-CT with increased ascites, new mass adjacent to the stomach-symptoms and CT findings likely secondary to progressive metastatic pancreas cancer  I discussed the CT findings and treatment options with Mr. Bisono and his wife. I discussed the case with Dr. [name] last week. There is no apparent treatment available for management of the biliary obstruction. He has progressive metastatic pancreas cancer. The recurrent bacteremia is likely related to cholangitis in the setting of intrahepatic obstructing tumor.  The ascites is likely malignant as opposed to related to peritonitis. I suspect the positive culture from peritoneal fluid last week was a contaminant.  I recommend comfort/supportive care. Mr. Hilbert is in agreement. He agrees to a [facility] hospice referral for home care.  Recommendations: 1. Palliative paracentesis, send fluid for cytology 2. Narcotic analgesics 3. [facility] hospice referral for home care 4. Decrease IV fluids in the setting of recurrent ascites    LOS: 0 days   Donneta Romberg, MD   06/03/2017, 9:18 AM

## 2017-06-03 NOTE — H&P (Signed)
History and Physical  Patient Name: Jacob Hardin     YSA:[id number]    DOB: 03/29/1946    DOA: 06/03/2017 PCP: [name], MD  Patient coming from: Home  Chief Complaint: Weakness, fatigue, malaise, abdominal pain and swelling      HPI: Jacob Hardin is a 71 y.o. male with a past medical history significant for metastatic pancreatic cancer on gemcitabine with complicated obstructive jaundice, external biliary drain and also stenting as recently as last month, HTN, and IDDM who presents with malaise worsening over the last week.  Since his last discharge, the patient was back home, walking around and even outside a little, per wife.  Per wife, then last Tuesday patient had another paracentesis and since then he has become weaker and weaker.  She recalls that last Thursday his IV vancomycin was stopped and he was started on penicillin by mouth.  Over the weekend, he got weaker and weaker until he could barely walk a few feet, complained of abdominal swelling and being out of breath, and also having intermittent abdominal pain, diffuse and severe and relieved only partly with his hydrocodone.  Tonight, his weakness was worse and wife thought he was somewhat confused, so she brought him to the ER.  No fever, cough, sputum production.  Abdominal pain was actually better today.    ED course: -Afebrile, heart rate 105, RR 21, BP 125/80, SpO2 normal -Na 128, K 5.2, Cr 1.04 (baseline 0.8), WBC 32.8K with bandemia, Hgb 9.2 and normocytic -Ammonia 41 -INR 1.3 -Lactic acid 3.2 -CTA chest showed no PE or pneumonia -CT abdomen showed moderate left pleural effusion, rim enhancing lesions of the lesser curvature of the stomach and ascites -A paracentesis was attempted in the ER but EDP was unable to locate sufficienct pocket -He was given vancomycin, ceftriaxone and flagyl and some IV fluids and TRH were asked to evaluate for admission    The patient has had recurrent bacteremia since his  most recent duodenal stent in June: 6/20: Stent placed 6/22: Admitted with fever, sepsis --> blood cultures +Klebsiella pneumonia treated with cefepime, discharged on Bactrim with oral Vanc for C diff ppx 7/16: Repeat blood cultures as outpatient grew GNRs again 7/18: readmitted for positive cultures --> again blood cultures +Klebsiella, treated again with Cefepime and discharged on Bactrim 7/22: IR exchanged his biliary drain 7/30: Started on oral Levaquin by Onc given persistent fever without clear source, blood cultures drawn 8/1: Cultures from 7/30 grew lactobacillus actually 8/3: Blood cultures discussed with ID, Levauin stopped, and he was started on vancomycin with Home health (Lactobacillus culture report actually states both vanc and FQ resistance however) 8/7: Had paracentesis, gram stain positive for GPRs 8/9: Culture of ascitic fluid grows Corynebacterium, he is changed to penicillin           ROS: Review of Systems  Constitutional: Positive for malaise/fatigue.  Respiratory: Positive for shortness of breath.   Gastrointestinal: Positive for abdominal pain (and ascites again).  Neurological: Positive for weakness.  All other systems reviewed and are negative.         Past Medical History:  Diagnosis Date  . Elevated liver enzymes   . GERD (gastroesophageal reflux disease)   . Hyperlipidemia   . Hypertension    recently weight lost-no meds now-running low.  . Obstructive jaundice   . Oxalate nephropathy   . PONV (postoperative nausea and vomiting)   . Primary pancreatic adenocarcinoma (HCC)    mets to liver and duodenum  .  Pruritus   . Restless legs   . Skin cancer    "burned off my face & head"  . Type II diabetes mellitus ([place])   . Weight loss     Past Surgical History:  Procedure Laterality Date  . ANTERIOR CERVICAL DECOMP/DISCECTOMY FUSION  1994   cervical-bone graft  . BACK SURGERY    . COLONOSCOPY    . DUODENAL STENT PLACEMENT N/A 11/22/2015     Procedure: DUODENAL STENT PLACEMENT;  Surgeon: Carol Ada, MD;  Location: [place];  Service: Endoscopy;  Laterality: N/A;  . DUODENAL STENT PLACEMENT N/A 04/10/2017   Procedure: DUODENAL STENT PLACEMENT;  Surgeon: Carol Ada, MD;  Location: [facility] ENDOSCOPY;  Service: Endoscopy;  Laterality: N/A;  . ERCP N/A 10/26/2016   Procedure: ENDOSCOPIC RETROGRADE CHOLANGIOPANCREATOGRAPHY (ERCP);  Surgeon: Carol Ada, MD;  Location: [facility] ENDOSCOPY;  Service: Endoscopy;  Laterality: N/A;  . ESOPHAGOGASTRODUODENOSCOPY N/A 11/18/2015   Procedure: ESOPHAGOGASTRODUODENOSCOPY (EGD);  Surgeon: Carol Ada, MD;  Location: [facility] ENDOSCOPY;  Service: Endoscopy;  Laterality: N/A;  . ESOPHAGOGASTRODUODENOSCOPY N/A 11/22/2015   Procedure: ESOPHAGOGASTRODUODENOSCOPY (EGD);  Surgeon: Carol Ada, MD;  Location: [facility] ENDOSCOPY;  Service: Endoscopy;  Laterality: N/A;  Uncovered duodenal stent placement.  Fluoroscopy required.  . ESOPHAGOGASTRODUODENOSCOPY N/A 10/14/2016   Procedure: ESOPHAGOGASTRODUODENOSCOPY (EGD);  Surgeon: Carol Ada, MD;  Location: [facility];  Service: Endoscopy;  Laterality: N/A;  . ESOPHAGOGASTRODUODENOSCOPY (EGD) WITH PROPOFOL N/A 04/10/2017   Procedure: ESOPHAGOGASTRODUODENOSCOPY (EGD) WITH PROPOFOL;  Surgeon: Carol Ada, MD;  Location: [facility] ENDOSCOPY;  Service: Endoscopy;  Laterality: N/A;  . EUS  11/18/2015   Procedure: UPPER ENDOSCOPIC ULTRASOUND (EUS) LINEAR;  Surgeon: Carol Ada, MD;  Location: [facility];  Service: Endoscopy;;  . INGUINAL HERNIA REPAIR Left   . IR CHOLANGIOGRAM EXISTING TUBE  04/15/2017  . IR CONVERT BILIARY DRAIN TO INT EXT BILIARY DRAIN  03/20/2017  . IR EXCHANGE BILIARY DRAIN  02/12/2017  . IR EXCHANGE BILIARY DRAIN  03/12/2017  . IR EXCHANGE BILIARY DRAIN  05/10/2017  . IR GENERIC HISTORICAL  10/16/2016   IR BILIARY DRAIN PLACEMENT WITH CHOLANGIOGRAM 10/16/2016 Marybelle Killings, MD [facility]  . IR GENERIC HISTORICAL  11/07/2016   IR EXCHANGE BILIARY DRAIN 11/07/2016  Arne Cleveland, MD [facility]  . IR GENERIC HISTORICAL  11/12/2016   IR PATIENT EVAL TECH 0-60 MINS 11/12/2016 Arne Cleveland, MD [facility]  . IR PARACENTESIS  05/28/2017  . SHOULDER ARTHROSCOPY W/ ROTATOR CUFF REPAIR Left 2014  . TONSILLECTOMY    . WISDOM TOOTH EXTRACTION      Social History: Patient lives with his wife.  The patient walks unassisted.  Nonmoker.  He is from [place].  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    Allergies  Allergen Reactions  . Codeine Nausea Only    Family history: family history includes Cancer in his brother, father, paternal grandfather, and paternal uncle; Diabetes in his brother and mother; Hypertension in his father and mother.  Prior to Admission medications   Medication Sig Start Date End Date Taking? Authorizing Provider  acetaminophen (TYLENOL) 500 MG tablet Take 1,000 mg by mouth every 6 (six) hours as needed.   Yes [provider]  ALPRAZolam (XANAX) 0.25 MG tablet Take 1 tablet (0.25 mg total) by mouth at bedtime as needed for anxiety or sleep. 05/27/17  Yes Owens Shark, NP  baclofen (LIORESAL) 10 MG tablet Take 5 mg by mouth 3 (three) times daily as needed (for hiccups).   Yes [provider]  BD  PEN NEEDLE NANO U/F 32G X 4 MM MISC USE TO INJECT INSULIN TWICE A DAY 11/06/16  Yes [name], MD  HYDROcodone-acetaminophen (NORCO) 5-325 MG tablet Take 1-2 tablets by mouth every 6 (six) hours as needed for moderate pain. 05/27/17  Yes Owens Shark, NP  ibuprofen (ADVIL,MOTRIN) 200 MG tablet Take 400 mg by mouth every 6 (six) hours as needed for headache, mild pain or moderate pain.    Yes [provider]  insulin aspart (NOVOLOG FLEXPEN) 100 UNIT/ML FlexPen Inject 4 Units into the skin daily before supper.   Yes [provider]  Insulin Detemir (LEVEMIR FLEXTOUCH) 100 UNIT/ML Pen Inject 25 Units into the skin daily. 04/16/17  Yes Rosita Fire, MD  levothyroxine (SYNTHROID, LEVOTHROID) 25 MCG  tablet Take 1 tablet (25 mcg total) by mouth daily before breakfast. 05/27/17  Yes Owens Shark, NP  lidocaine-prilocaine (EMLA) cream Apply 1 application topically as needed (prior to accessing port). 04/22/17  Yes Ladell Pier, MD  lipase/protease/amylase (CREON) 36000 UNITS CPEP capsule Take 1 capsule (36,000 Units total) by mouth 3 (three) times daily before meals. 02/22/17  Yes Ladell Pier, MD  loratadine (CLARITIN) 10 MG tablet Take 10 mg by mouth daily.    Yes [provider]  Multiple Vitamin (MULTIVITAMIN WITH MINERALS) TABS tablet Take 1 tablet by mouth daily.   Yes [provider]  ondansetron (ZOFRAN) 8 MG tablet Take 1 tablet (8 mg total) by mouth every 8 (eight) hours as needed for nausea or vomiting. 05/27/17  Yes Owens Shark, NP  ONE TOUCH ULTRA TEST test strip TEST TWICE A DAY 04/22/17  Yes [name], MD  penicillin v potassium (VEETID) 500 MG tablet Take 1 tablet (500 mg total) by mouth 4 (four) times daily. 05/30/17  Yes Ladell Pier, MD  polyethylene glycol (MIRALAX / GLYCOLAX) packet Take 17 g by mouth daily as needed for mild constipation.    Yes [provider]  prochlorperazine (COMPAZINE) 10 MG tablet Take 1 tablet (10 mg total) by mouth every 6 (six) hours as needed for nausea or vomiting. 03/05/17  Yes Owens Shark, NP  traMADol (ULTRAM) 50 MG tablet Take 1 tablet (50 mg total) by mouth every 6 (six) hours as needed. Patient taking differently: Take 50 mg by mouth every 6 (six) hours as needed for moderate pain or severe pain.  05/27/17  Yes Owens Shark, NP       Physical Exam: BP 126/79   Pulse 80   Temp 98.1 F (36.7 C) (Oral)   Resp 10   Ht 6' (1.829 m)   Wt 75.3 kg (166 lb)   SpO2 94%   BMI 22.51 kg/m  General appearance: Chronically ill appearing adult male, alert and in no acute distress, tired.   Eyes: Icteric, conjunctiva pink, lids and lashes normal. PERRL.    ENT: No nasal deformity, discharge, epistaxis.   Hearing normal. OP with dry MM without lesions.   Neck: No neck masses.  Trachea midline.  No thyromegaly/tenderness. Lymph: No cervical or supraclavicular lymphadenopathy. Skin: Warm and dry.  Jaundice.  No suspicious rashes or lesions. Cardiac: RRR, nl S1-S2, no murmurs appreciated.  Capillary refill is brisk.  JVP not visible.  2+ LE edema.  Radial pulses 2+ and symmetric. Respiratory: Normal respiratory rate and rhythm.  CTAB without rales or wheezes. Abdomen: No focal TTP or guarding. Ascites noted.   MSK: No deformities or effusions.  No cyanosis or  clubbing. Neuro: Cranial nerves normal.  Sensation intact to light touch. Speech is fluent.  Muscle strength weak but symmetric.    Psych: Sensorium intact and responding to questions, attention normal.  Behavior appropriate.  Affect blunted.  Judgment and insight appear normal.     Labs on Admission:  I have personally reviewed following labs and imaging studies: CBC:  Recent Labs Lab 05/27/17 1036 06/03/17 0210  WBC 12.1* 32.8*  NEUTROABS 9.9* 26.9*  HGB 9.0* 9.2*  HCT 27.4* 26.5*  MCV 91.3 87.2  PLT 197 409*   Basic Metabolic Panel:  Recent Labs Lab 05/27/17 1036 06/03/17 0210  NA 129* 128*  K 4.9 5.2*  CL  --  93*  CO2 22 23  GLUCOSE 192* 153*  BUN 19.1 20  CREATININE 0.8 1.04  CALCIUM 8.5 8.2*   GFR: Estimated Creatinine Clearance: 71.4 mL/min (by C-G formula based on SCr of 1.04 mg/dL).  Liver Function Tests:  Recent Labs Lab 05/27/17 1036 06/03/17 0210  AST 63* 72*  ALT 47 43  ALKPHOS 782* 1,305*  BILITOT 4.98* 4.7*  PROT 5.8* 6.5  ALBUMIN 1.7* 1.7*   No results for input(s): LIPASE, AMYLASE in the last 168 hours.  Recent Labs Lab 06/03/17 0210  AMMONIA 41*   Coagulation Profile:  Recent Labs Lab 06/03/17 0210  INR 1.32   Sepsis Labs: Lactic acid 3.25  Recent Results (from the past 240 hour(s))  Culture, body fluid-bottle     Status: Abnormal   Collection Time: 05/28/17  2:18 PM    Result Value Ref Range Status   Specimen Description PERITONEAL  Final   Special Requests NONE  Final   Gram Stain   Final    GRAM POSITIVE RODS IN BOTH AEROBIC AND ANAEROBIC BOTTLES CRITICAL RESULT CALLED TO, READ BACK BY AND VERIFIED WITH: Ephriam Knuckles, RN ([place]) AT [place] ON 05/30/17 BY C. JESSUP, MLT CONCERNING GRAM STAIN.    Culture (A)  Final    DIPHTHEROIDS(CORYNEBACTERIUM SPECIES) Standardized susceptibility testing for this organism is not available.    Report Status 06/01/2017 FINAL  Final  Gram stain     Status: None   Collection Time: 05/28/17  2:18 PM  Result Value Ref Range Status   Specimen Description PERITONEAL  Final   Special Requests NONE  Final   Gram Stain   Final    MODERATE WBC PRESENT, PREDOMINANTLY PMN NO ORGANISMS SEEN    Report Status 05/28/2017 FINAL  Final         Radiological Exams on Admission: Personally reviewed CT head, CT abdomen and CTA chest reports: Ct Head Wo Contrast  Result Date: 06/03/2017 CLINICAL DATA:  Acute onset of confusion and slurred speech. Hypoglycemia. Generalized weakness. Expressive aphasia. Initial encounter. EXAM: CT HEAD WITHOUT CONTRAST TECHNIQUE: Contiguous axial images were obtained from the base of the skull through the vertex without intravenous contrast. COMPARISON:  None. FINDINGS: Brain: No evidence of acute infarction, hemorrhage, hydrocephalus, extra-axial collection or mass lesion/mass effect. Mild periventricular white matter change likely reflects small vessel ischemic microangiopathy. The posterior fossa, including the cerebellum, brainstem and fourth ventricle, is within normal limits. The third and lateral ventricles, and basal ganglia are unremarkable in appearance. The cerebral hemispheres are symmetric in appearance, with normal gray-white differentiation. No mass effect or midline shift is seen. Vascular: No hyperdense vessel or unexpected calcification. Skull: There is no evidence of fracture; visualized osseous  structures are unremarkable in appearance. Sinuses/Orbits: The orbits are within normal limits. The paranasal sinuses and mastoid  air cells are well-aerated. Other: No significant soft tissue abnormalities are seen. IMPRESSION: 1. No acute intracranial pathology seen on CT. 2. Mild small vessel ischemic microangiopathy. Electronically Signed   By: Garald Balding M.D.   On: 06/03/2017 04:12   Ct Angio Chest Pe W And/or Wo Contrast  Result Date: 06/03/2017 CLINICAL DATA:  71 y/o M; pancreatic cancer with Mets to the liver with fluid removed from the abdomen last week. Recurrent abdominal distention, orthopnea, ongoing chemotherapy, elevated bilirubin, chest pain, and shortness of breath. EXAM: CT ANGIOGRAPHY CHEST CT ABDOMEN AND PELVIS WITH CONTRAST TECHNIQUE: Multidetector CT imaging of the chest was performed using the standard protocol during bolus administration of intravenous contrast. Multiplanar CT image reconstructions and MIPs were obtained to evaluate the vascular anatomy. Multidetector CT imaging of the abdomen and pelvis was performed using the standard protocol during bolus administration of intravenous contrast. CONTRAST:  100 cc Isovue 370 COMPARISON:  None. FINDINGS: CTA CHEST FINDINGS Cardiovascular: Satisfactory opacification of the pulmonary arteries to the segmental level. No evidence of pulmonary embolism. Mild cardiomegaly. Severe coronary artery calcification coronary artery calcification. Normal caliber thoracic aorta with mild calcific atherosclerosis. Mediastinum/Nodes: Prominent subcentimeter mediastinal lymph nodes, possibly reactive or due to edema. Normal thoracic esophagus. Normal thyroid gland. Lungs/Pleura: Trace right and moderate left pleural effusions. Calcified plaque along the right lateral chest wall, probably related to prior asbestos exposure. Partial atelectasis of lower lobes bilaterally likely secondary pleural effusions. No pulmonary metastatic disease identified.  Musculoskeletal: No chest wall abnormality. No acute or significant osseous findings. Review of the MIP images confirms the above findings. CT ABDOMEN and PELVIS FINDINGS Hepatobiliary: Numerous liver metastasis are present, some of which are overall stable to mildly decreased in size in comparison to prior CT of the abdomen and pelvis, for example the previous dominant lesion in segment 4B now measures 2.0 cm (series 12, image 25). Normal gallbladder. Transhepatic common bile duct catheter and common bile duct stent in situ unchanged in position in comparison with the prior CT of abdomen and pelvis. Pain main portal vein and superior mesenteric vein. Stable occlusion of the splenic vein. Pancreas: Ill-defined mass in head of pancreas is stable. Atrophy of pancreatic body and tail with main duct dilatation is stable. Interval development of irregular rim enhancing masslike lesions within the lesser sac extending over the body of the stomach into the left upper quadrant. Lesions in the lesser sac measure 4.1 x 2.6 cm and 5.1 x 3.3 cm in the axial plane (series 12, image 32) and anterior to the gastric body the lesion measures up to 6.7 x 4.9 cm in the axial plane (series 12, image 21). Spleen: Normal in size without focal abnormality. Adrenals/Urinary Tract: No focal kidney lesion, or hydronephrosis is identified. Normal appearance of the bladder. Bilateral nonspecific adrenal hypertrophy. Stomach/Bowel: Stent within the gastric antrum extending into first and second portions of the duodenum. Irregular soft tissue thickening within the walls of gastric antrum, lesser curvature of the stomach, and proximal duodenum has worsened in comparison with the prior CT of abdomen and pelvis. Large volume of stool throughout the colon. No apparent obstructive or inflammatory changes of the small and large bowel. Vascular/Lymphatic: Severe calcification of the abdominal aorta. Prominent stable upper retroperitoneal lymph nodes  and periportal lymph nodes. Reproductive: Negative. Other: Large volume of ascites is much increased in comparison with the prior CT of abdomen and pelvis. Musculoskeletal: With Review of the MIP images confirms the above findings. IMPRESSION: CTA chest: 1. No pulmonary  embolus identified. 2. Low lung volumes likely due to large volume ascites. 3. Trace right and moderate left pleural effusion. 4. Partial compressive atelectasis of lower lobes. 5. Mild cardiomegaly and severe coronary artery calcification. CT abdomen and pelvis: 1. Interval development of irregular rim enhancing masslike lesions within the lesser sac along lesser curvature of stomach and extending anterior to the gastric body into the left upper quadrant measuring up to 6.7 cm in the axial plane. Differential includes interval development of necrotic metastasis, acute peripancreatic collections, and/or abscess. 2. Worsening wall thickening along gastric antrum and proximal duodenum which may represent local progression of disease or superimposed inflammation. 3. Liver metastasis are stable to mildly decreased in size. 4. Large volume of ascites is increased from prior CT. Electronically Signed   By: Kristine Garbe M.D.   On: 06/03/2017 04:22   Ct Abdomen Pelvis W Contrast  Result Date: 06/03/2017 CLINICAL DATA:  71 y/o M; pancreatic cancer with Mets to the liver with fluid removed from the abdomen last week. Recurrent abdominal distention, orthopnea, ongoing chemotherapy, elevated bilirubin, chest pain, and shortness of breath. EXAM: CT ANGIOGRAPHY CHEST CT ABDOMEN AND PELVIS WITH CONTRAST TECHNIQUE: Multidetector CT imaging of the chest was performed using the standard protocol during bolus administration of intravenous contrast. Multiplanar CT image reconstructions and MIPs were obtained to evaluate the vascular anatomy. Multidetector CT imaging of the abdomen and pelvis was performed using the standard protocol during bolus  administration of intravenous contrast. CONTRAST:  100 cc Isovue 370 COMPARISON:  None. FINDINGS: CTA CHEST FINDINGS Cardiovascular: Satisfactory opacification of the pulmonary arteries to the segmental level. No evidence of pulmonary embolism. Mild cardiomegaly. Severe coronary artery calcification coronary artery calcification. Normal caliber thoracic aorta with mild calcific atherosclerosis. Mediastinum/Nodes: Prominent subcentimeter mediastinal lymph nodes, possibly reactive or due to edema. Normal thoracic esophagus. Normal thyroid gland. Lungs/Pleura: Trace right and moderate left pleural effusions. Calcified plaque along the right lateral chest wall, probably related to prior asbestos exposure. Partial atelectasis of lower lobes bilaterally likely secondary pleural effusions. No pulmonary metastatic disease identified. Musculoskeletal: No chest wall abnormality. No acute or significant osseous findings. Review of the MIP images confirms the above findings. CT ABDOMEN and PELVIS FINDINGS Hepatobiliary: Numerous liver metastasis are present, some of which are overall stable to mildly decreased in size in comparison to prior CT of the abdomen and pelvis, for example the previous dominant lesion in segment 4B now measures 2.0 cm (series 12, image 25). Normal gallbladder. Transhepatic common bile duct catheter and common bile duct stent in situ unchanged in position in comparison with the prior CT of abdomen and pelvis. Pain main portal vein and superior mesenteric vein. Stable occlusion of the splenic vein. Pancreas: Ill-defined mass in head of pancreas is stable. Atrophy of pancreatic body and tail with main duct dilatation is stable. Interval development of irregular rim enhancing masslike lesions within the lesser sac extending over the body of the stomach into the left upper quadrant. Lesions in the lesser sac measure 4.1 x 2.6 cm and 5.1 x 3.3 cm in the axial plane (series 12, image 32) and anterior to the  gastric body the lesion measures up to 6.7 x 4.9 cm in the axial plane (series 12, image 21). Spleen: Normal in size without focal abnormality. Adrenals/Urinary Tract: No focal kidney lesion, or hydronephrosis is identified. Normal appearance of the bladder. Bilateral nonspecific adrenal hypertrophy. Stomach/Bowel: Stent within the gastric antrum extending into first and second portions of the duodenum. Irregular soft tissue  thickening within the walls of gastric antrum, lesser curvature of the stomach, and proximal duodenum has worsened in comparison with the prior CT of abdomen and pelvis. Large volume of stool throughout the colon. No apparent obstructive or inflammatory changes of the small and large bowel. Vascular/Lymphatic: Severe calcification of the abdominal aorta. Prominent stable upper retroperitoneal lymph nodes and periportal lymph nodes. Reproductive: Negative. Other: Large volume of ascites is much increased in comparison with the prior CT of abdomen and pelvis. Musculoskeletal: With Review of the MIP images confirms the above findings. IMPRESSION: CTA chest: 1. No pulmonary embolus identified. 2. Low lung volumes likely due to large volume ascites. 3. Trace right and moderate left pleural effusion. 4. Partial compressive atelectasis of lower lobes. 5. Mild cardiomegaly and severe coronary artery calcification. CT abdomen and pelvis: 1. Interval development of irregular rim enhancing masslike lesions within the lesser sac along lesser curvature of stomach and extending anterior to the gastric body into the left upper quadrant measuring up to 6.7 cm in the axial plane. Differential includes interval development of necrotic metastasis, acute peripancreatic collections, and/or abscess. 2. Worsening wall thickening along gastric antrum and proximal duodenum which may represent local progression of disease or superimposed inflammation. 3. Liver metastasis are stable to mildly decreased in size. 4. Large  volume of ascites is increased from prior CT. Electronically Signed   By: Kristine Garbe M.D.   On: 06/03/2017 04:22    Echocardiogram 2017: Report reviewed EF 60-65%    Assessment/Plan  1. Presumed sepsis:  Patient meets criteria given tachycardia, tachypnea, and leukocytosis with bandemia.  Appears comfortable, and suspect degree of lactic acidosis is exaggerated by liver failure.    He has numerous possible causes of infection: firstly, this new rim-enhancing lesion (i.e. Intra-abd abscess by stomach?) although also very likely cholangitis (given his internal and external biliary drains) vs SBP (from cancer or stent eroding the GI tract?).   -Consult to GI Dr. [name] and Onc Dr. [name] who are more familiar with patient's care -Vancomycin and Zosyn IV -IV fluids  -Follow blood, urine cultures -Try to obtain abdominal fluid for culture -Trend lactic acid -Consult palliative care for goals of care   2. Obstructive jaundice:  As above. -Consult to GI  3. Ascites:  -Will ask IR to attempt US guided paracentesis with gram stain, cell count and cultures  4. Hyponatremia: Near baseline.   -IV fluids sparingly and trend  5. Pancreatic cancer:  -Continue BZD -Continue Norco -Consult palliative care  6. Hypertension:  Not currently on meds  7. Diabetes:  -Continue Levemir -SSI with meals  8. Hypothyroidism: -Continue levothyroxine  9. Normocytic anemia:  At baseline       DVT prophylaxis: SCDs  Code Status: DNR  Family Communication: Wife at bedside  Disposition Plan: Anticipate IV fluids, empiric antibiotics, will discuss by phone with GI/Onc. Consults called: Inbasket message sent to Oncology Admission status: INPATIENT    Medical decision making: Patient seen at 4:55 AM on 06/03/2017.  The patient was discussed with Dr. Kathrynn Humble.  What exists of the patient's chart was reviewed in depth and summarized above.  Clinical condition: stable.         Edwin Dada [facility] Pager [phone]

## 2017-06-03 NOTE — Progress Notes (Signed)
A consult was received from an ED physician for vancomycin per pharmacy dosing.  The patient's profile has been reviewed for ht/wt/allergies/indication/available labs.   A one time order has been placed for vancomycin 1 Gm.  Further antibiotics/pharmacy consults should be ordered by admitting physician if indicated.                       Thank you, Dorrene German 06/03/2017 3:33 AM

## 2017-06-03 NOTE — Progress Notes (Signed)
Initial Nutrition Assessment  INTERVENTION:   Protein shakes from home D/c Ensure per patient preference RD will continue to monitor for plan  NUTRITION DIAGNOSIS:   Increased nutrient needs related to cancer and cancer related treatments as evidenced by estimated needs.  GOAL:   Patient will meet greater than or equal to 90% of their needs  MONITOR:   PO intake, Labs, Weight trends, I & O's (GOC)  REASON FOR ASSESSMENT:   Malnutrition Screening Tool    ASSESSMENT:   71 y.o. male with a past medical history significant for metastatic pancreatic cancer on gemcitabine with complicated obstructive jaundice, external biliary drain and also stenting as recently as last month, HTN, and IDDM who presents with malaise worsening over the last week.  Patient not in room at time of visit, having paracentesis. Pt seen in previous admissions, most recently in June 2018 and in July was seen by [facility] RD which revealed that patient was continuing to drink protein shakes at home. Pt had some weight loss at that time.  Since then, pt has gained weight which is likely d/t ascites and fluid accumulation. Pt reported in H&P, nausea and poor appetite PTA.  Pt has been ordered Ensure supplements, given previous assessments pt does not like them and prefers Special K protein shakes or Carnation Instant drinks.  Palliative care following patient for goals of care. Will monitor for plan. Medications: CREON capsule TID  Labs reviewed: CBGs: 146-191 Low Na Elevated K  Diet Order:  Diet regular Room service appropriate? Yes; Fluid consistency: Thin  Skin:  Reviewed, no issues  Last BM:  8/13  Height:   Ht Readings from Last 1 Encounters:  06/03/17 6' (1.829 m)    Weight:   Wt Readings from Last 1 Encounters:  06/03/17 176 lb 12.9 oz (80.2 kg)    Ideal Body Weight:  80.9 kg  BMI:  Body mass index is 23.98 kg/m.  Estimated Nutritional Needs:   Kcal:  2000-2200  Protein:   105-115g  Fluid:  2L/day  EDUCATION NEEDS:   No education needs identified at this time  Clayton Bibles, MS, RD, LDN Pager: [phone] After Hours Pager: [phone]

## 2017-06-03 NOTE — ED Notes (Signed)
Abnormal lab result MD Kathrynn Humble have been made aware

## 2017-06-03 NOTE — ED Provider Notes (Signed)
[facility] DEPT Provider Note   CSN: [id number] Arrival date & time: 06/03/17  0118     History   Chief Complaint Chief Complaint  Patient presents with  . Abdominal Pain    abdomen swelling    HPI Jacob Hardin is a 71 y.o. male.  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 Pt reports that he has been getting weaker the last few days and  Getting short of breath. Pt's symptoms have worsened over the last 2 weeks. He now gets short of breath when laying supine and with minimal exertion. Pt has had paracentesis in the past and reports that his abdomen is back distended again. Pt is not on chemo at the moment. Pt has nausea, anorexia and continues to have weight loss. ROS also positive for confusion.   Past Medical History:  Diagnosis Date  . Elevated liver enzymes   . GERD (gastroesophageal reflux disease)   . Hyperlipidemia   . Hypertension    recently weight lost-no meds now-running low.  . Obstructive jaundice   . Oxalate nephropathy   . PONV (postoperative nausea and vomiting)   . Primary pancreatic adenocarcinoma (HCC)    mets to liver and duodenum  . Pruritus   . Restless legs   . Skin cancer    "burned off my face & head"  . Type II diabetes mellitus ([place])   . Weight loss     Patient Active Problem List   Diagnosis Date Noted  . Sepsis ([place]) 06/03/2017  . Goals of care, counseling/discussion 04/22/2017  . Bacteremia due to Enterobacter species 04/15/2017  . History of Clostridium difficile colitis 04/15/2017  . DM2 (diabetes mellitus, type 2) ([place]) 02/09/2017  . Pulmonary nodule, right 02/09/2017  . Biliary obstruction   . Obstructive jaundice   . GERD (gastroesophageal reflux disease) 10/12/2016  . Hyperbilirubinemia  10/12/2016  . Port catheter in place 02/14/2016  . Malnutrition of moderate degree 01/21/2016  . Immunocompromised patient ([place])   . Viridans streptococci infection   . Bacteremia due to Klebsiella pneumoniae   . Central line infection   . Primary pancreatic cancer with metastasis to other site [facility])   . Cancer of head of pancreas ([place]) 11/24/2015  . Diabetes mellitus without complication ([place])   . Hyperlipidemia   . Hypertension     Past Surgical History:  Procedure Laterality Date  . ANTERIOR CERVICAL DECOMP/DISCECTOMY FUSION  1994   cervical-bone graft  . BACK SURGERY    . COLONOSCOPY    . DUODENAL STENT PLACEMENT N/A 11/22/2015   Procedure: DUODENAL STENT PLACEMENT;  Surgeon: Carol Ada, MD;  Location: [place];  Service: Endoscopy;  Laterality: N/A;  . DUODENAL STENT PLACEMENT N/A 04/10/2017   Procedure: DUODENAL STENT PLACEMENT;  Surgeon: Carol Ada, MD;  Location: [facility] ENDOSCOPY;  Service: Endoscopy;  Laterality: N/A;  . ERCP N/A 10/26/2016   Procedure: ENDOSCOPIC RETROGRADE CHOLANGIOPANCREATOGRAPHY (ERCP);  Surgeon: Carol Ada, MD;  Location: [facility] ENDOSCOPY;  Service: Endoscopy;  Laterality: N/A;  . ESOPHAGOGASTRODUODENOSCOPY N/A 11/18/2015   Procedure: ESOPHAGOGASTRODUODENOSCOPY (EGD);  Surgeon: Carol Ada, MD;  Location[facility] ENDOSCOPY;  Service: Endoscopy;  Laterality: N/A;  . ESOPHAGOGASTRODUODENOSCOPY N/A 11/22/2015   Procedure: ESOPHAGOGASTRODUODENOSCOPY (EGD);  Surgeon: Carol Ada, MD;  Location: [facility] ENDOSCOPY;  Service: Endoscopy;  Laterality: N/A;  Uncovered duodenal stent placement.  Fluoroscopy required.  Marland Kitchen  ESOPHAGOGASTRODUODENOSCOPY N/A 10/14/2016   Procedure: ESOPHAGOGASTRODUODENOSCOPY (EGD);  Surgeon: Carol Ada, MD;  Location: [facility];  Service: Endoscopy;  Laterality: N/A;  . ESOPHAGOGASTRODUODENOSCOPY (EGD) WITH PROPOFOL N/A 04/10/2017   Procedure: ESOPHAGOGASTRODUODENOSCOPY (EGD) WITH PROPOFOL;  Surgeon: Carol Ada, MD;  Location: [facility] ENDOSCOPY;   Service: Endoscopy;  Laterality: N/A;  . EUS  11/18/2015   Procedure: UPPER ENDOSCOPIC ULTRASOUND (EUS) LINEAR;  Surgeon: Carol Ada, MD;  Location: [place];  Service: Endoscopy;;  . INGUINAL HERNIA REPAIR Left   . IR CHOLANGIOGRAM EXISTING TUBE  04/15/2017  . IR CONVERT BILIARY DRAIN TO INT EXT BILIARY DRAIN  03/20/2017  . IR EXCHANGE BILIARY DRAIN  02/12/2017  . IR EXCHANGE BILIARY DRAIN  03/12/2017  . IR EXCHANGE BILIARY DRAIN  05/10/2017  . IR GENERIC HISTORICAL  10/16/2016   IR BILIARY DRAIN PLACEMENT WITH CHOLANGIOGRAM 10/16/2016 Marybelle Killings, MD [facility]-INTERV RAD  . IR GENERIC HISTORICAL  11/07/2016   IR EXCHANGE BILIARY DRAIN 11/07/2016 Arne Cleveland, MD [facility]  . IR GENERIC HISTORICAL  11/12/2016   IR PATIENT EVAL TECH 0-60 MINS 11/12/2016 Arne Cleveland, MD [facility]INTER[facility]  . IR PARACENTESIS  05/28/2017  . SHOULDER ARTHROSCOPY W/ ROTATOR CUFF REPAIR Left 2014  . TONSILLECTOMY    . WISDOM TOOTH EXTRACTION         Home Medications    Prior to Admission medications   Medication Sig Start Date End Date Taking? Authorizing Provider  acetaminophen (TYLENOL) 500 MG tablet Take 1,000 mg by mouth every 6 (six) hours as needed.   Yes [provider]  ALPRAZolam (XANAX) 0.25 MG tablet Take 1 tablet (0.25 mg total) by mouth at bedtime as needed for anxiety or sleep. 05/27/17  Yes Owens Shark, NP  baclofen (LIORESAL) 10 MG tablet Take 5 mg by mouth 3 (three) times daily as needed (for hiccups).   Yes [provider]  BD PEN NEEDLE NANO U/F 32G X 4 MM MISC USE TO INJECT INSULIN TWICE A DAY 11/06/16  Yes [name], MD  HYDROcodone-acetaminophen (NORCO) 5-325 MG tablet Take 1-2 tablets by mouth every 6 (six) hours as needed for moderate pain. 05/27/17  Yes Owens Shark, NP  ibuprofen (ADVIL,MOTRIN) 200 MG tablet Take 400 mg by mouth every 6 (six) hours as needed for headache, mild pain or moderate pain.    Yes [provider]  insulin aspart (NOVOLOG  FLEXPEN) 100 UNIT/ML FlexPen Inject 4 Units into the skin daily before supper.   Yes [provider]  Insulin Detemir (LEVEMIR FLEXTOUCH) 100 UNIT/ML Pen Inject 25 Units into the skin daily. 04/16/17  Yes Rosita Fire, MD  levothyroxine (SYNTHROID, LEVOTHROID) 25 MCG tablet Take 1 tablet (25 mcg total) by mouth daily before breakfast. 05/27/17  Yes Owens Shark, NP  lidocaine-prilocaine (EMLA) cream Apply 1 application topically as needed (prior to accessing port). 04/22/17  Yes Ladell Pier, MD  lipase/protease/amylase (CREON) 36000 UNITS CPEP capsule Take 1 capsule (36,000 Units total) by mouth 3 (three) times daily before meals. 02/22/17  Yes Ladell Pier, MD  loratadine (CLARITIN) 10 MG tablet Take 10 mg by mouth daily.    Yes [provider]  Multiple Vitamin (MULTIVITAMIN WITH MINERALS) TABS tablet Take 1 tablet by mouth daily.   Yes [provider]  ondansetron (ZOFRAN) 8 MG tablet Take 1 tablet (8 mg total) by mouth every 8 (eight) hours as needed for nausea or vomiting. 05/27/17  Yes Owens Shark, NP  ONE Margurite Auerbach  TEST test strip TEST TWICE A DAY 04/22/17  Yes [name], MD  penicillin v potassium (VEETID) 500 MG tablet Take 1 tablet (500 mg total) by mouth 4 (four) times daily. 05/30/17  Yes Ladell Pier, MD  polyethylene glycol (MIRALAX / GLYCOLAX) packet Take 17 g by mouth daily as needed for mild constipation.    Yes [provider]  prochlorperazine (COMPAZINE) 10 MG tablet Take 1 tablet (10 mg total) by mouth every 6 (six) hours as needed for nausea or vomiting. 03/05/17  Yes Owens Shark, NP  traMADol (ULTRAM) 50 MG tablet Take 1 tablet (50 mg total) by mouth every 6 (six) hours as needed. Patient taking differently: Take 50 mg by mouth every 6 (six) hours as needed for moderate pain or severe pain.  05/27/17  Yes Owens Shark, NP  levofloxacin (LEVAQUIN) 500 MG tablet Take 1 tablet (500 mg total) by mouth daily. Patient not  taking: Reported on 06/03/2017 05/20/17   Ladell Pier, MD    Family History Family History  Problem Relation Age of Onset  . Diabetes Mother   . Hypertension Mother   . Cancer Father        lung 74  . Hypertension Father   . Cancer Brother        pancreatic   . Diabetes Brother   . Cancer Paternal Grandfather        colon  . Cancer Paternal Uncle        prostate    Social History Social History  Substance Use Topics  . Smoking status: Never Smoker  . Smokeless tobacco: Never Used  . Alcohol use No     Allergies   Codeine   Review of Systems Review of Systems  Constitutional: Positive for activity change and fatigue.  Respiratory: Positive for shortness of breath.   Gastrointestinal: Positive for abdominal distention, abdominal pain and nausea.  Allergic/Immunologic: Positive for immunocompromised state.  Neurological: Positive for weakness.  Hematological: Does not bruise/bleed easily.  All other systems reviewed and are negative.    Physical Exam Updated Vital Signs BP 126/79   Pulse 80   Temp 98.1 F (36.7 C) (Oral)   Resp 10   Ht 6' (1.829 m)   Wt 75.3 kg (166 lb)   SpO2 94%   BMI 22.51 kg/m   Physical Exam  Constitutional: He is oriented to person, place, and time. He appears well-developed.  HENT:  Head: Normocephalic and atraumatic.  Eyes: Pupils are equal, round, and reactive to light. Conjunctivae and EOM are normal. No scleral icterus.  Neck: Normal range of motion. Neck supple.  No meningismus  Cardiovascular: Normal rate and regular rhythm.   Pulmonary/Chest: Effort normal and breath sounds normal.  Abdominal: Bowel sounds are normal. He exhibits distension. He exhibits no mass. There is tenderness. There is no rebound and no guarding.  Musculoskeletal: He exhibits edema. He exhibits no deformity.  Neurological: He is alert and oriented to person, place, and time.  Skin: Skin is warm.  Nursing note and vitals reviewed.    ED  Treatments / Results  Labs (all labs ordered are listed, but only abnormal results are displayed) Labs Reviewed  CBC WITH DIFFERENTIAL/PLATELET - Abnormal; Notable for the following:       Result Value   WBC 32.8 (*)    RBC 3.04 (*)    Hemoglobin 9.2 (*)    HCT 26.5 (*)    RDW 17.3 (*)    Platelets  407 (*)    Neutro Abs 26.9 (*)    Monocytes Absolute 3.6 (*)    All other components within normal limits  COMPREHENSIVE METABOLIC PANEL - Abnormal; Notable for the following:    Sodium 128 (*)    Potassium 5.2 (*)    Chloride 93 (*)    Glucose, Bld 153 (*)    Calcium 8.2 (*)    Albumin 1.7 (*)    AST 72 (*)    Alkaline Phosphatase 1,305 (*)    Total Bilirubin 4.7 (*)    All other components within normal limits  AMMONIA - Abnormal; Notable for the following:    Ammonia 41 (*)    All other components within normal limits  PROTIME-INR - Abnormal; Notable for the following:    Prothrombin Time 16.5 (*)    All other components within normal limits  APTT - Abnormal; Notable for the following:    aPTT 41 (*)    All other components within normal limits  I-STAT CG4 LACTIC ACID, ED - Abnormal; Notable for the following:    Lactic Acid, Venous 3.25 (*)    All other components within normal limits  CULTURE, BLOOD (ROUTINE X 2)  CULTURE, BLOOD (ROUTINE X 2)  URINE CULTURE  BODY FLUID CULTURE  URINALYSIS, ROUTINE W REFLEX MICROSCOPIC  LACTATE DEHYDROGENASE, PLEURAL OR PERITONEAL FLUID  GLUCOSE, PLEURAL OR PERITONEAL FLUID  PROTEIN, PLEURAL OR PERITONEAL FLUID  ALBUMIN, PLEURAL OR PERITONEAL FLUID  BODY FLUID CELL COUNT WITH DIFFERENTIAL    EKG  EKG Interpretation None       Radiology Ct Head Wo Contrast  Result Date: 06/03/2017 CLINICAL DATA:  Acute onset of confusion and slurred speech. Hypoglycemia. Generalized weakness. Expressive aphasia. Initial encounter. EXAM: CT HEAD WITHOUT CONTRAST TECHNIQUE: Contiguous axial images were obtained from the base of the skull  through the vertex without intravenous contrast. COMPARISON:  None. FINDINGS: Brain: No evidence of acute infarction, hemorrhage, hydrocephalus, extra-axial collection or mass lesion/mass effect. Mild periventricular white matter change likely reflects small vessel ischemic microangiopathy. The posterior fossa, including the cerebellum, brainstem and fourth ventricle, is within normal limits. The third and lateral ventricles, and basal ganglia are unremarkable in appearance. The cerebral hemispheres are symmetric in appearance, with normal gray-white differentiation. No mass effect or midline shift is seen. Vascular: No hyperdense vessel or unexpected calcification. Skull: There is no evidence of fracture; visualized osseous structures are unremarkable in appearance. Sinuses/Orbits: The orbits are within normal limits. The paranasal sinuses and mastoid air cells are well-aerated. Other: No significant soft tissue abnormalities are seen. IMPRESSION: 1. No acute intracranial pathology seen on CT. 2. Mild small vessel ischemic microangiopathy. Electronically Signed   By: Garald Balding M.D.   On: 06/03/2017 04:12   Ct Angio Chest Pe W And/or Wo Contrast  Result Date: 06/03/2017 CLINICAL DATA:  71 y/o M; pancreatic cancer with Mets to the liver with fluid removed from the abdomen last week. Recurrent abdominal distention, orthopnea, ongoing chemotherapy, elevated bilirubin, chest pain, and shortness of breath. EXAM: CT ANGIOGRAPHY CHEST CT ABDOMEN AND PELVIS WITH CONTRAST TECHNIQUE: Multidetector CT imaging of the chest was performed using the standard protocol during bolus administration of intravenous contrast. Multiplanar CT image reconstructions and MIPs were obtained to evaluate the vascular anatomy. Multidetector CT imaging of the abdomen and pelvis was performed using the standard protocol during bolus administration of intravenous contrast. CONTRAST:  100 cc Isovue 370 COMPARISON:  None. FINDINGS: CTA CHEST  FINDINGS Cardiovascular: Satisfactory opacification of the pulmonary  arteries to the segmental level. No evidence of pulmonary embolism. Mild cardiomegaly. Severe coronary artery calcification coronary artery calcification. Normal caliber thoracic aorta with mild calcific atherosclerosis. Mediastinum/Nodes: Prominent subcentimeter mediastinal lymph nodes, possibly reactive or due to edema. Normal thoracic esophagus. Normal thyroid gland. Lungs/Pleura: Trace right and moderate left pleural effusions. Calcified plaque along the right lateral chest wall, probably related to prior asbestos exposure. Partial atelectasis of lower lobes bilaterally likely secondary pleural effusions. No pulmonary metastatic disease identified. Musculoskeletal: No chest wall abnormality. No acute or significant osseous findings. Review of the MIP images confirms the above findings. CT ABDOMEN and PELVIS FINDINGS Hepatobiliary: Numerous liver metastasis are present, some of which are overall stable to mildly decreased in size in comparison to prior CT of the abdomen and pelvis, for example the previous dominant lesion in segment 4B now measures 2.0 cm (series 12, image 25). Normal gallbladder. Transhepatic common bile duct catheter and common bile duct stent in situ unchanged in position in comparison with the prior CT of abdomen and pelvis. Pain main portal vein and superior mesenteric vein. Stable occlusion of the splenic vein. Pancreas: Ill-defined mass in head of pancreas is stable. Atrophy of pancreatic body and tail with main duct dilatation is stable. Interval development of irregular rim enhancing masslike lesions within the lesser sac extending over the body of the stomach into the left upper quadrant. Lesions in the lesser sac measure 4.1 x 2.6 cm and 5.1 x 3.3 cm in the axial plane (series 12, image 32) and anterior to the gastric body the lesion measures up to 6.7 x 4.9 cm in the axial plane (series 12, image 21). Spleen: Normal  in size without focal abnormality. Adrenals/Urinary Tract: No focal kidney lesion, or hydronephrosis is identified. Normal appearance of the bladder. Bilateral nonspecific adrenal hypertrophy. Stomach/Bowel: Stent within the gastric antrum extending into first and second portions of the duodenum. Irregular soft tissue thickening within the walls of gastric antrum, lesser curvature of the stomach, and proximal duodenum has worsened in comparison with the prior CT of abdomen and pelvis. Large volume of stool throughout the colon. No apparent obstructive or inflammatory changes of the small and large bowel. Vascular/Lymphatic: Severe calcification of the abdominal aorta. Prominent stable upper retroperitoneal lymph nodes and periportal lymph nodes. Reproductive: Negative. Other: Large volume of ascites is much increased in comparison with the prior CT of abdomen and pelvis. Musculoskeletal: With Review of the MIP images confirms the above findings. IMPRESSION: CTA chest: 1. No pulmonary embolus identified. 2. Low lung volumes likely due to large volume ascites. 3. Trace right and moderate left pleural effusion. 4. Partial compressive atelectasis of lower lobes. 5. Mild cardiomegaly and severe coronary artery calcification. CT abdomen and pelvis: 1. Interval development of irregular rim enhancing masslike lesions within the lesser sac along lesser curvature of stomach and extending anterior to the gastric body into the left upper quadrant measuring up to 6.7 cm in the axial plane. Differential includes interval development of necrotic metastasis, acute peripancreatic collections, and/or abscess. 2. Worsening wall thickening along gastric antrum and proximal duodenum which may represent local progression of disease or superimposed inflammation. 3. Liver metastasis are stable to mildly decreased in size. 4. Large volume of ascites is increased from prior CT. Electronically Signed   By: Kristine Garbe M.D.   On:  06/03/2017 04:22   Ct Abdomen Pelvis W Contrast  Result Date: 06/03/2017 CLINICAL DATA:  71 y/o M; pancreatic cancer with Mets to the liver with fluid removed  from the abdomen last week. Recurrent abdominal distention, orthopnea, ongoing chemotherapy, elevated bilirubin, chest pain, and shortness of breath. EXAM: CT ANGIOGRAPHY CHEST CT ABDOMEN AND PELVIS WITH CONTRAST TECHNIQUE: Multidetector CT imaging of the chest was performed using the standard protocol during bolus administration of intravenous contrast. Multiplanar CT image reconstructions and MIPs were obtained to evaluate the vascular anatomy. Multidetector CT imaging of the abdomen and pelvis was performed using the standard protocol during bolus administration of intravenous contrast. CONTRAST:  100 cc Isovue 370 COMPARISON:  None. FINDINGS: CTA CHEST FINDINGS Cardiovascular: Satisfactory opacification of the pulmonary arteries to the segmental level. No evidence of pulmonary embolism. Mild cardiomegaly. Severe coronary artery calcification coronary artery calcification. Normal caliber thoracic aorta with mild calcific atherosclerosis. Mediastinum/Nodes: Prominent subcentimeter mediastinal lymph nodes, possibly reactive or due to edema. Normal thoracic esophagus. Normal thyroid gland. Lungs/Pleura: Trace right and moderate left pleural effusions. Calcified plaque along the right lateral chest wall, probably related to prior asbestos exposure. Partial atelectasis of lower lobes bilaterally likely secondary pleural effusions. No pulmonary metastatic disease identified. Musculoskeletal: No chest wall abnormality. No acute or significant osseous findings. Review of the MIP images confirms the above findings. CT ABDOMEN and PELVIS FINDINGS Hepatobiliary: Numerous liver metastasis are present, some of which are overall stable to mildly decreased in size in comparison to prior CT of the abdomen and pelvis, for example the previous dominant lesion in segment  4B now measures 2.0 cm (series 12, image 25). Normal gallbladder. Transhepatic common bile duct catheter and common bile duct stent in situ unchanged in position in comparison with the prior CT of abdomen and pelvis. Pain main portal vein and superior mesenteric vein. Stable occlusion of the splenic vein. Pancreas: Ill-defined mass in head of pancreas is stable. Atrophy of pancreatic body and tail with main duct dilatation is stable. Interval development of irregular rim enhancing masslike lesions within the lesser sac extending over the body of the stomach into the left upper quadrant. Lesions in the lesser sac measure 4.1 x 2.6 cm and 5.1 x 3.3 cm in the axial plane (series 12, image 32) and anterior to the gastric body the lesion measures up to 6.7 x 4.9 cm in the axial plane (series 12, image 21). Spleen: Normal in size without focal abnormality. Adrenals/Urinary Tract: No focal kidney lesion, or hydronephrosis is identified. Normal appearance of the bladder. Bilateral nonspecific adrenal hypertrophy. Stomach/Bowel: Stent within the gastric antrum extending into first and second portions of the duodenum. Irregular soft tissue thickening within the walls of gastric antrum, lesser curvature of the stomach, and proximal duodenum has worsened in comparison with the prior CT of abdomen and pelvis. Large volume of stool throughout the colon. No apparent obstructive or inflammatory changes of the small and large bowel. Vascular/Lymphatic: Severe calcification of the abdominal aorta. Prominent stable upper retroperitoneal lymph nodes and periportal lymph nodes. Reproductive: Negative. Other: Large volume of ascites is much increased in comparison with the prior CT of abdomen and pelvis. Musculoskeletal: With Review of the MIP images confirms the above findings. IMPRESSION: CTA chest: 1. No pulmonary embolus identified. 2. Low lung volumes likely due to large volume ascites. 3. Trace right and moderate left pleural  effusion. 4. Partial compressive atelectasis of lower lobes. 5. Mild cardiomegaly and severe coronary artery calcification. CT abdomen and pelvis: 1. Interval development of irregular rim enhancing masslike lesions within the lesser sac along lesser curvature of stomach and extending anterior to the gastric body into the left upper quadrant measuring up  to 6.7 cm in the axial plane. Differential includes interval development of necrotic metastasis, acute peripancreatic collections, and/or abscess. 2. Worsening wall thickening along gastric antrum and proximal duodenum which may represent local progression of disease or superimposed inflammation. 3. Liver metastasis are stable to mildly decreased in size. 4. Large volume of ascites is increased from prior CT. Electronically Signed   By: Kristine Garbe M.D.   On: 06/03/2017 04:22    Procedures Procedures (including critical care time)  CRITICAL CARE Performed by: Varney Biles   Total critical care time: 78 minutes for severe sepsis  Critical care time was exclusive of separately billable procedures and treating other patients.  Critical care was necessary to treat or prevent imminent or life-threatening deterioration.  Critical care was time spent personally by me on the following activities: development of treatment plan with patient and/or surrogate as well as nursing, discussions with consultants, evaluation of patient's response to treatment, examination of patient, obtaining history from patient or surrogate, ordering and performing treatments and interventions, ordering and review of laboratory studies, ordering and review of radiographic studies, pulse oximetry and re-evaluation of patient's condition.   Medications Ordered in ED Medications  iopamidol (ISOVUE-370) 76 % injection (not administered)  vancomycin (VANCOCIN) IVPB 1000 mg/200 mL premix (1,000 mg Intravenous New Bag/Given 06/03/17 0432)  metroNIDAZOLE (FLAGYL) IVPB  500 mg (not administered)  lidocaine-EPINEPHrine (XYLOCAINE W/EPI) 2 %-1:200000 (PF) injection (not administered)  lidocaine-EPINEPHrine (XYLOCAINE W/EPI) 2 %-1:200000 (PF) injection 20 mL (not administered)  iopamidol (ISOVUE-370) 76 % injection 100 mL (100 mLs Intravenous Contrast Given 06/03/17 0329)  cefTRIAXone (ROCEPHIN) 2 g in dextrose 5 % 50 mL IVPB (2 g Intravenous New Bag/Given 06/03/17 0340)     Initial Impression / Assessment and Plan / ED Course  I have reviewed the triage vital signs and the nursing notes.  Pertinent labs & imaging results that were available during my care of the patient were reviewed by me and considered in my medical decision making (see chart for details).  Clinical Course as of Jun 03 449  Mon Jun 03, 2017  0330 Elevated WC with bandemia.  Previous admission cultures, CT scan reviewed, and we will add CT abdomen.  Broad spectrum antibiotics to be ordered and sepsis pathway to be initiated. We also will consider adding CT abdomen with contrast and look to r/o SBP in the ER if possible.  WBC: (!) 32.8 [AN]  0420 Lactate is elevated. Blood cultures / and ivab started prior to 3 hour cutoff. Pt is not in septic shock, but has severe sepsis based on laccate. Some of the lactate is elevated due to liver dz leading to poor clearance.  I was unable to recognize a large pocket to be comfortable to do a diagnostic paracentesis.  Lactic Acid, Venous: (!!) 3.25 [AN]  0447 Based on CT I am concerned for likely necrotic mass which subsequently caused infection and abscess. Pt will need medicine admission and consults to palliative care, IR, ID in my opinion. I have discussed my concerns with Dr. Loleta Books - and he will f/u with the consults. Results from the ER workup discussed with the patient face to face and all questions answered to the best of my ability. Pharmacy had been consulted to see if pt should get anything more than penicillin, ceftriaxone and flagyl -  their recs are pending.  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 Pt also is   DdX: PE Pneumonia Pleural effusion Severe ascites Peritonitis Biliary infection Hepatic encephalopathy  Basic labs ordered. CT PE ordered. Ct head ordered to r/o mets due to confusion.  Long discussion about palliative care. Pt has reasonable understanding of his disease process and prognosis. He does want to talk about palliative care, but also wants to continue seeing his cancer team.    Final Clinical Impressions(s) / ED Diagnoses   Final diagnoses:  Pancreatic carcinoma metastatic to liver [facility])    New Prescriptions New Prescriptions   No medications on file     Varney Biles, MD 06/03/17 [phone]

## 2017-06-03 NOTE — ED Triage Notes (Signed)
States abdomen swelling had it drained off and came right back and took off more than a liter. States hard to breath in certain positions.  No respiratory or acute distress noted while triaging pt.

## 2017-06-04 ENCOUNTER — Telehealth: Payer: Self-pay

## 2017-06-04 DIAGNOSIS — I10 Essential (primary) hypertension: Secondary | ICD-10-CM | POA: Diagnosis not present

## 2017-06-04 DIAGNOSIS — R188 Other ascites: Secondary | ICD-10-CM | POA: Diagnosis not present

## 2017-06-04 DIAGNOSIS — C259 Malignant neoplasm of pancreas, unspecified: Secondary | ICD-10-CM | POA: Diagnosis not present

## 2017-06-04 DIAGNOSIS — D649 Anemia, unspecified: Secondary | ICD-10-CM | POA: Diagnosis not present

## 2017-06-04 DIAGNOSIS — Z7189 Other specified counseling: Secondary | ICD-10-CM | POA: Diagnosis not present

## 2017-06-04 DIAGNOSIS — R180 Malignant ascites: Secondary | ICD-10-CM | POA: Diagnosis not present

## 2017-06-04 DIAGNOSIS — C250 Malignant neoplasm of head of pancreas: Secondary | ICD-10-CM | POA: Diagnosis not present

## 2017-06-04 DIAGNOSIS — R109 Unspecified abdominal pain: Secondary | ICD-10-CM | POA: Diagnosis not present

## 2017-06-04 DIAGNOSIS — E871 Hypo-osmolality and hyponatremia: Secondary | ICD-10-CM | POA: Diagnosis not present

## 2017-06-04 DIAGNOSIS — Z515 Encounter for palliative care: Secondary | ICD-10-CM | POA: Diagnosis not present

## 2017-06-04 DIAGNOSIS — E440 Moderate protein-calorie malnutrition: Secondary | ICD-10-CM | POA: Diagnosis not present

## 2017-06-04 DIAGNOSIS — K831 Obstruction of bile duct: Secondary | ICD-10-CM | POA: Diagnosis not present

## 2017-06-04 DIAGNOSIS — A419 Sepsis, unspecified organism: Secondary | ICD-10-CM | POA: Diagnosis not present

## 2017-06-04 DIAGNOSIS — E119 Type 2 diabetes mellitus without complications: Secondary | ICD-10-CM | POA: Diagnosis not present

## 2017-06-04 DIAGNOSIS — C787 Secondary malignant neoplasm of liver and intrahepatic bile duct: Secondary | ICD-10-CM | POA: Diagnosis not present

## 2017-06-04 LAB — COMPREHENSIVE METABOLIC PANEL
ALBUMIN: 1.5 g/dL — AB (ref 3.5–5.0)
ALT: 38 U/L (ref 17–63)
ANION GAP: 9 (ref 5–15)
AST: 50 U/L — AB (ref 15–41)
Alkaline Phosphatase: 992 U/L — ABNORMAL HIGH (ref 38–126)
BUN: 18 mg/dL (ref 6–20)
CHLORIDE: 94 mmol/L — AB (ref 101–111)
CO2: 24 mmol/L (ref 22–32)
Calcium: 7.7 mg/dL — ABNORMAL LOW (ref 8.9–10.3)
Creatinine, Ser: 1.02 mg/dL (ref 0.61–1.24)
GFR calc Af Amer: >60 mL/min (ref >60)
GFR calc non Af Amer: >60 mL/min (ref >60)
GLUCOSE: 213 mg/dL — AB (ref 65–99)
POTASSIUM: 4.5 mmol/L (ref 3.5–5.1)
SODIUM: 127 mmol/L — AB (ref 135–145)
TOTAL PROTEIN: 5.5 g/dL — AB (ref 6.5–8.1)
Total Bilirubin: 4.7 mg/dL — ABNORMAL HIGH (ref 0.3–1.2)

## 2017-06-04 LAB — URINE CULTURE: Culture: NO GROWTH

## 2017-06-04 LAB — GLUCOSE, CAPILLARY
Glucose-Capillary: 210 mg/dL — ABNORMAL HIGH (ref 65–99)
Glucose-Capillary: 211 mg/dL — ABNORMAL HIGH (ref 65–99)
Glucose-Capillary: 220 mg/dL — ABNORMAL HIGH (ref 65–99)
Glucose-Capillary: 216 mg/dL — ABNORMAL HIGH (ref 65–99)
Glucose-Capillary: 248 mg/dL — ABNORMAL HIGH (ref 65–99)

## 2017-06-04 LAB — CBC
HCT: 25 % — ABNORMAL LOW (ref 39.0–52.0)
Hemoglobin: 8.4 g/dL — ABNORMAL LOW (ref 13.0–17.0)
MCH: 29.5 pg (ref 26.0–34.0)
MCHC: 33.6 g/dL (ref 30.0–36.0)
MCV: 87.7 fL (ref 78.0–100.0)
PLATELETS: 311 10*3/uL (ref 150–400)
RBC: 2.85 MIL/uL — ABNORMAL LOW (ref 4.22–5.81)
RDW: 17.4 % — ABNORMAL HIGH (ref 11.5–15.5)
WBC: 22.5 10*3/uL — AB (ref 4.0–10.5)

## 2017-06-04 NOTE — Telephone Encounter (Signed)
Absolutely, whatever I can do to help.

## 2017-06-04 NOTE — Progress Notes (Signed)
IP PROGRESS NOTE  Subjective:   Jacob Hardin reports feeling better after the paracentesis procedure yesterday. Xanax has helped anxiety. No fever or chills.  Objective: Vital signs in last 24 hours: Blood pressure 120/72, pulse 83, temperature 97.7 F (36.5 C), temperature source Oral, resp. rate 14, height 6' (1.829 m), weight 176 lb 12.9 oz (80.2 kg), SpO2 95 %.  Intake/Output from previous day: 08/13 0701 - 08/14 0700 In: 725 [P.O.:175; IV Piggyback:550] Out: -   Physical Exam:  Lungs: Decreased breath sounds at the left greater than right lower chest Cardiac: Regular rate and rhythm Abdomen: Mildly distended with ascites Extremities: Pitting edema at the lower leg bilaterally   Portacath/PICC-without erythema  Lab Results:  Recent Labs  06/03/17 0210 06/04/17 0414  WBC 32.8* 22.5*  HGB 9.2* 8.4*  HCT 26.5* 25.0*  PLT 407* 311    BMET  Recent Labs  06/03/17 0210 06/04/17 0414  NA 128* 127*  K 5.2* 4.5  CL 93* 94*  CO2 23 24  GLUCOSE 153* 213*  BUN 20 18  CREATININE 1.04 1.02  CALCIUM 8.2* 7.7*    No results found for: CEA1  Studies/Results: Ct Head Wo Contrast  Result Date: 06/03/2017 CLINICAL DATA:  Acute onset of confusion and slurred speech. Hypoglycemia. Generalized weakness. Expressive aphasia. Initial encounter. EXAM: CT HEAD WITHOUT CONTRAST TECHNIQUE: Contiguous axial images were obtained from the base of the skull through the vertex without intravenous contrast. COMPARISON:  None. FINDINGS: Brain: No evidence of acute infarction, hemorrhage, hydrocephalus, extra-axial collection or mass lesion/mass effect. Mild periventricular white matter change likely reflects small vessel ischemic microangiopathy. The posterior fossa, including the cerebellum, brainstem and fourth ventricle, is within normal limits. The third and lateral ventricles, and basal ganglia are unremarkable in appearance. The cerebral hemispheres are symmetric in appearance, with  normal gray-white differentiation. No mass effect or midline shift is seen. Vascular: No hyperdense vessel or unexpected calcification. Skull: There is no evidence of fracture; visualized osseous structures are unremarkable in appearance. Sinuses/Orbits: The orbits are within normal limits. The paranasal sinuses and mastoid air cells are well-aerated. Other: No significant soft tissue abnormalities are seen. IMPRESSION: 1. No acute intracranial pathology seen on CT. 2. Mild small vessel ischemic microangiopathy. Electronically Signed   By: Garald Balding M.D.   On: 06/03/2017 04:12   Ct Angio Chest Pe W And/or Wo Contrast  Result Date: 06/03/2017 CLINICAL DATA:  71 y/o M; pancreatic cancer with Mets to the liver with fluid removed from the abdomen last week. Recurrent abdominal distention, orthopnea, ongoing chemotherapy, elevated bilirubin, chest pain, and shortness of breath. EXAM: CT ANGIOGRAPHY CHEST CT ABDOMEN AND PELVIS WITH CONTRAST TECHNIQUE: Multidetector CT imaging of the chest was performed using the standard protocol during bolus administration of intravenous contrast. Multiplanar CT image reconstructions and MIPs were obtained to evaluate the vascular anatomy. Multidetector CT imaging of the abdomen and pelvis was performed using the standard protocol during bolus administration of intravenous contrast. CONTRAST:  100 cc Isovue 370 COMPARISON:  None. FINDINGS: CTA CHEST FINDINGS Cardiovascular: Satisfactory opacification of the pulmonary arteries to the segmental level. No evidence of pulmonary embolism. Mild cardiomegaly. Severe coronary artery calcification coronary artery calcification. Normal caliber thoracic aorta with mild calcific atherosclerosis. Mediastinum/Nodes: Prominent subcentimeter mediastinal lymph nodes, possibly reactive or due to edema. Normal thoracic esophagus. Normal thyroid gland. Lungs/Pleura: Trace right and moderate left pleural effusions. Calcified plaque along the right  lateral chest wall, probably related to prior asbestos exposure. Partial atelectasis of lower lobes  bilaterally likely secondary pleural effusions. No pulmonary metastatic disease identified. Musculoskeletal: No chest wall abnormality. No acute or significant osseous findings. Review of the MIP images confirms the above findings. CT ABDOMEN and PELVIS FINDINGS Hepatobiliary: Numerous liver metastasis are present, some of which are overall stable to mildly decreased in size in comparison to prior CT of the abdomen and pelvis, for example the previous dominant lesion in segment 4B now measures 2.0 cm (series 12, image 25). Normal gallbladder. Transhepatic common bile duct catheter and common bile duct stent in situ unchanged in position in comparison with the prior CT of abdomen and pelvis. Pain main portal vein and superior mesenteric vein. Stable occlusion of the splenic vein. Pancreas: Ill-defined mass in head of pancreas is stable. Atrophy of pancreatic body and tail with main duct dilatation is stable. Interval development of irregular rim enhancing masslike lesions within the lesser sac extending over the body of the stomach into the left upper quadrant. Lesions in the lesser sac measure 4.1 x 2.6 cm and 5.1 x 3.3 cm in the axial plane (series 12, image 32) and anterior to the gastric body the lesion measures up to 6.7 x 4.9 cm in the axial plane (series 12, image 21). Spleen: Normal in size without focal abnormality. Adrenals/Urinary Tract: No focal kidney lesion, or hydronephrosis is identified. Normal appearance of the bladder. Bilateral nonspecific adrenal hypertrophy. Stomach/Bowel: Stent within the gastric antrum extending into first and second portions of the duodenum. Irregular soft tissue thickening within the walls of gastric antrum, lesser curvature of the stomach, and proximal duodenum has worsened in comparison with the prior CT of abdomen and pelvis. Large volume of stool throughout the colon. No  apparent obstructive or inflammatory changes of the small and large bowel. Vascular/Lymphatic: Severe calcification of the abdominal aorta. Prominent stable upper retroperitoneal lymph nodes and periportal lymph nodes. Reproductive: Negative. Other: Large volume of ascites is much increased in comparison with the prior CT of abdomen and pelvis. Musculoskeletal: With Review of the MIP images confirms the above findings. IMPRESSION: CTA chest: 1. No pulmonary embolus identified. 2. Low lung volumes likely due to large volume ascites. 3. Trace right and moderate left pleural effusion. 4. Partial compressive atelectasis of lower lobes. 5. Mild cardiomegaly and severe coronary artery calcification. CT abdomen and pelvis: 1. Interval development of irregular rim enhancing masslike lesions within the lesser sac along lesser curvature of stomach and extending anterior to the gastric body into the left upper quadrant measuring up to 6.7 cm in the axial plane. Differential includes interval development of necrotic metastasis, acute peripancreatic collections, and/or abscess. 2. Worsening wall thickening along gastric antrum and proximal duodenum which may represent local progression of disease or superimposed inflammation. 3. Liver metastasis are stable to mildly decreased in size. 4. Large volume of ascites is increased from prior CT. Electronically Signed   By: Kristine Garbe M.D.   On: 06/03/2017 04:22   Ct Abdomen Pelvis W Contrast  Result Date: 06/03/2017 CLINICAL DATA:  71 y/o M; pancreatic cancer with Mets to the liver with fluid removed from the abdomen last week. Recurrent abdominal distention, orthopnea, ongoing chemotherapy, elevated bilirubin, chest pain, and shortness of breath. EXAM: CT ANGIOGRAPHY CHEST CT ABDOMEN AND PELVIS WITH CONTRAST TECHNIQUE: Multidetector CT imaging of the chest was performed using the standard protocol during bolus administration of intravenous contrast. Multiplanar CT  image reconstructions and MIPs were obtained to evaluate the vascular anatomy. Multidetector CT imaging of the abdomen and pelvis was performed using  the standard protocol during bolus administration of intravenous contrast. CONTRAST:  100 cc Isovue 370 COMPARISON:  None. FINDINGS: CTA CHEST FINDINGS Cardiovascular: Satisfactory opacification of the pulmonary arteries to the segmental level. No evidence of pulmonary embolism. Mild cardiomegaly. Severe coronary artery calcification coronary artery calcification. Normal caliber thoracic aorta with mild calcific atherosclerosis. Mediastinum/Nodes: Prominent subcentimeter mediastinal lymph nodes, possibly reactive or due to edema. Normal thoracic esophagus. Normal thyroid gland. Lungs/Pleura: Trace right and moderate left pleural effusions. Calcified plaque along the right lateral chest wall, probably related to prior asbestos exposure. Partial atelectasis of lower lobes bilaterally likely secondary pleural effusions. No pulmonary metastatic disease identified. Musculoskeletal: No chest wall abnormality. No acute or significant osseous findings. Review of the MIP images confirms the above findings. CT ABDOMEN and PELVIS FINDINGS Hepatobiliary: Numerous liver metastasis are present, some of which are overall stable to mildly decreased in size in comparison to prior CT of the abdomen and pelvis, for example the previous dominant lesion in segment 4B now measures 2.0 cm (series 12, image 25). Normal gallbladder. Transhepatic common bile duct catheter and common bile duct stent in situ unchanged in position in comparison with the prior CT of abdomen and pelvis. Pain main portal vein and superior mesenteric vein. Stable occlusion of the splenic vein. Pancreas: Ill-defined mass in head of pancreas is stable. Atrophy of pancreatic body and tail with main duct dilatation is stable. Interval development of irregular rim enhancing masslike lesions within the lesser sac extending  over the body of the stomach into the left upper quadrant. Lesions in the lesser sac measure 4.1 x 2.6 cm and 5.1 x 3.3 cm in the axial plane (series 12, image 32) and anterior to the gastric body the lesion measures up to 6.7 x 4.9 cm in the axial plane (series 12, image 21). Spleen: Normal in size without focal abnormality. Adrenals/Urinary Tract: No focal kidney lesion, or hydronephrosis is identified. Normal appearance of the bladder. Bilateral nonspecific adrenal hypertrophy. Stomach/Bowel: Stent within the gastric antrum extending into first and second portions of the duodenum. Irregular soft tissue thickening within the walls of gastric antrum, lesser curvature of the stomach, and proximal duodenum has worsened in comparison with the prior CT of abdomen and pelvis. Large volume of stool throughout the colon. No apparent obstructive or inflammatory changes of the small and large bowel. Vascular/Lymphatic: Severe calcification of the abdominal aorta. Prominent stable upper retroperitoneal lymph nodes and periportal lymph nodes. Reproductive: Negative. Other: Large volume of ascites is much increased in comparison with the prior CT of abdomen and pelvis. Musculoskeletal: With Review of the MIP images confirms the above findings. IMPRESSION: CTA chest: 1. No pulmonary embolus identified. 2. Low lung volumes likely due to large volume ascites. 3. Trace right and moderate left pleural effusion. 4. Partial compressive atelectasis of lower lobes. 5. Mild cardiomegaly and severe coronary artery calcification. CT abdomen and pelvis: 1. Interval development of irregular rim enhancing masslike lesions within the lesser sac along lesser curvature of stomach and extending anterior to the gastric body into the left upper quadrant measuring up to 6.7 cm in the axial plane. Differential includes interval development of necrotic metastasis, acute peripancreatic collections, and/or abscess. 2. Worsening wall thickening along  gastric antrum and proximal duodenum which may represent local progression of disease or superimposed inflammation. 3. Liver metastasis are stable to mildly decreased in size. 4. Large volume of ascites is increased from prior CT. Electronically Signed   By: Kristine Garbe M.D.   On: 06/03/2017  04:22   US Paracentesis  Result Date: 06/03/2017 INDICATION: Stage IV pancreatic cancer, recurrent ascites. Request made for diagnostic and therapeutic paracentesis. EXAM: ULTRASOUND GUIDED DIAGNOSTIC AND THERAPEUTIC PARACENTESIS MEDICATIONS: None. COMPLICATIONS: None immediate. PROCEDURE: Informed written consent was obtained from the patient after a discussion of the risks, benefits and alternatives to treatment. A timeout was performed prior to the initiation of the procedure. Initial ultrasound scanning demonstrates a moderate amount of ascites within the left lower abdominal quadrant. The left lower abdomen was prepped and draped in the usual sterile fashion. 1% lidocaine was used for local anesthesia. Following this, a Yueh catheter was introduced. An ultrasound image was saved for documentation purposes. The paracentesis was performed. The catheter was removed and a dressing was applied. The patient tolerated the procedure well without immediate post procedural complication. FINDINGS: A total of approximately 3 liters of slightly hazy, yellow fluid was removed. Samples were sent to the laboratory as requested by the clinical team. IMPRESSION: Successful ultrasound-guided diagnostic and therapeutic paracentesis yielding 3 liters of peritoneal fluid. Read by: Rowe Dwayne, PA-C Electronically Signed   By: Sandi Mariscal M.D.   On: 06/03/2017 12:19    Medications: I have reviewed the patient's current medications.  Assessment/Plan:  1. Pancreas cancer, stage IV, pancreas head mass, status post an EUS biopsy 11/18/2015 confirming adenocarcinoma  Upper endoscopy 11/18/2015 confirmed duodenal  invasion/obstruction with a biopsy confirming adenocarcinoma  Placement of a duodenal stent 11/22/2015  MRI abdomen 11/15/2015 revealed a pancreas head mass and liver metastases  Cycle 1 FOLFIRINOX 12/06/2015  Cycle 2 FOLFIRINOX 12/21/2015  Cycle 3 FOLFIRINOX 01/04/2016  CT abdomen/pelvis 01/19/2016 showed improvement in the pancreatic head mass and liver metastases.  Cycle 4 FOLFIRINOX 02/01/2016  Cycle 5 FOLFIRINOX 02/15/2016  Cycle 6 FOLFIRINOX 02/29/2016  Cycle 7 FOLFIRINOX 03/14/2016  Cycle 8 FOLFIRINOX 03/28/2016  CT abdomen/pelvis 04/09/2016-improvement in pancreas head mass and liver metastases, no new metastatic disease  Chemotherapy switch to FOLFIRI every 3 weeks beginning 04/11/2016  CT 07/30/2016-improvement in liver metastases, stable pancreas mass   FOLFIRI continued on a 3 week schedule  FOLFIRI 09/19/2016.  Treatment subsequently placed on hold due to biliary obstruction  CT 10/13/2016-no change in liver metastases or pancreas mass  FOLFIRI resumed on a 3 week schedule 01/07/2017  FOLFIRI 01/28/2017  FOLFIRI 02/26/2017  FOLFIRI 03/11/2017 (irinotecan dose reduced secondary to prolonged nausea)  FOLFIRI 04/01/2017  CT 04/12/2017-new and enlarging liver metastases, new soft tissue fullness at the pancreas tail  CT 05/09/2017-progressive liver metastases, increased ascites  Cycle 1 gemcitabine/Abraxane 05/13/2017 (Abraxane held secondary to hyperbilirubinemia)   2. Diabetes  3. History of Anorexia/weight loss secondary to #1  4. Obstructive jaundice secondary to #1-status post placement of a percutaneous internal/external biliary drain 11/25/2015; biliary drainage catheter capped 12/02/2015; biliary drain internalized 12/13/2015  5. Port-A-Cath placement 11/30/2015 interventional radiology  6. Admission 01/17/2016 with a high fever and rigors-no apparent source for infection upon review of his history and examination, blood  cultures positive for gram-positive cocci--viridans streptococcus--completed 2 weeks of IV ceftriaxone; no endocarditis on TTE.  7. Oxaliplatin neuropathy  8. Elevated transaminases, alkaline phosphatase, and bilirubin 06/13/2016.06/19/2016 bilirubin improved to normal range; liver enzymes remain elevated.  9. Generalized pruritus,no rash 10/10/2016.Likely secondary to hyperbilirubinemia. Resolved 11/12/2016. Recurrent 12/03/2016.Persistent pruritus.  10. Hospitalization 10/10/2016 through 10/20/2016 with biliary obstruction/sepsis. Status post placement of a biliary drain in interventional radiology 10/16/2016. Status post ERCP 10/26/2016 with biliary stent exchange. There was no bile drainage. Status post cholangiogram and exchange of biliary drain  11/07/2016.Biliary drain capped 11/12/2016.  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 C. difficile colitis 04/26/2018status post course of vancomycin.  14. Delayed nausea following chemotherapy-irinotecan dose reduced and prophylactic Decadron added with chemotherapy 03/11/2017; no significant nausea following chemotherapy 03/11/2017.  15. Admission with Klebsiella bacteremia 06/22/2018and 05/08/2017  16. Blood culture 05/20/2017 positive for Lactobacillus. Vancomycin initiated 05/25/2017 with 2 weeks planned to be followed by indefinite Augmentin.  17. Admission 06/03/2017 with progressive ascites, abdominal pain, and dyspnea-CT with increased ascites, new mass adjacent to the  stomach-symptoms and CT findings likely secondary to progressive metastatic pancreas cancer  Jacob Hardin appears more comfortable today. The paracentesis helped his dyspnea. He continues to have upper abdominal pain. 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  I discussed Hospice care with Mr. Levitz his wife. He plans to return home with Home hospice at discharge. We discussed CPR and ACLS issues. He agrees to a no CODE BLUE status.  Recommendations: 1. Decrease IV fluids 2. Hydrocodone for pain 3. [facility] hospice referral for home care 4. Discontinue IV antibiotics and blood cultures return negative, resume oral penicillin at discharge 5. Repeat therapeutic paracentesis for comfort prior to discharge, consider palliative peritoneal drainage catheter if ascites continues to reaccumulate rapidly  I will follow him at discharge with the [facility] program.    LOS: 1 day   Donneta Romberg, MD   06/04/2017, 8:29 AM

## 2017-06-04 NOTE — Progress Notes (Signed)
[facility]) Hospital Liaison:  RN visit  Notified by Lorenza Chick [facility], of patient/family request for [facility] services at home after discharge.  Chart and patient information are being reviewed with [facility] physician.  Hospice eligibility pending at this time.  Write spoke with Butch Penny, wife, at bedside to initiate education related to hospice philosophy, services and team approach to care.  Patient/family verbalized understanding of information given.  Per discussion, plan is for discharge to home by private vehicle on 06/05/17.  Please send signed and completed DNR form home with patient/family.  Patient will need prescriptions for discharge comfort medications.  DME needs have been discussed, patient currently has the following equipment in the home:  Walking stick (which patient uses as cane).  Patient/family requests the following DME for delivery to the home:  Has declined any equipment at this time.    [facility] is aware of the above.  Completed discharge summary will need to be faxed to [facility] at [phone], when final.  Please notify [facility] when patient is ready to leave the unit at discharge.  Call [phone] or [phone] if after 5 pm.  [facility] information and contact numbers have been given to Butch Penny, wife, during visit.  Above information shared with Cookie, CMRN.  Please call with any hospice related questions.  Thank you for the referral.  Edyth Gunnels, RN, BSN [facility] Liaison [phone]  All hospital liaisons are now on [place].

## 2017-06-04 NOTE — Care Management CC44 (Signed)
Condition Code 44 Documentation Completed  Patient Details  Name: Jacob Hardin MRN: [id number] Date of Birth: 1945/11/19   Condition Code 44 given:  Yes Patient signature on Condition Code 44 notice:  Yes Documentation of 2 MD's agreement:  Yes Code 44 added to claim:  Yes    Purcell Mouton, RN 06/04/2017, 4:48 PM

## 2017-06-04 NOTE — Progress Notes (Signed)
Pt and wife selected [facility], referral given Audrea Muscat, RN with [facility].

## 2017-06-04 NOTE — Progress Notes (Signed)
Daily Progress Note   Patient Name: Jacob Hardin       Date: 06/04/2017 DOB: Sep 27, 1946  Age: 71 y.o. MRN#: [id number] Attending Physician: Patrecia Pour, MD Primary Care Physician: [name], MD Admit Date: 06/03/2017  Reason for Consultation/Follow-up: Establishing goals of care, Non pain symptom management and Pain control  Subjective: I met today with Jacob Hardin and his wife.  See below.  Length of Stay: 1  Current Medications: Scheduled Meds:  . insulin aspart  0-5 Units Subcutaneous QHS  . insulin aspart  0-9 Units Subcutaneous TID WC  . insulin detemir  25 Units Subcutaneous Daily  . levothyroxine  25 mcg Oral QAC breakfast  . lipase/protease/amylase  36,000 Units Oral TID AC    Continuous Infusions:   PRN Meds: acetaminophen, ALPRAZolam, ALPRAZolam, baclofen, HYDROcodone-acetaminophen, HYDROmorphone (DILAUDID) injection, ipratropium-albuterol, lidocaine-prilocaine, ondansetron **OR** ondansetron (ZOFRAN) IV, polyethylene glycol, sodium chloride flush  Physical Exam         Older than stated age appearing, visibly icteric appearing gentleman S1-S2 Diminished breath sounds Abdomen distended Trace edema Awake alert nonfocal  Vital Signs: BP 117/73 (BP Location: Right Arm)   Pulse 80   Temp 97.6 F (36.4 C) (Oral)   Resp 16   Ht 6' (1.829 m)   Wt 80.2 kg (176 lb 12.9 oz)   SpO2 96%   BMI 23.98 kg/m  SpO2: SpO2: 96 % O2 Device: O2 Device: Not Delivered O2 Flow Rate: O2 Flow Rate (L/min): 3 L/min  Intake/output summary:  Intake/Output Summary (Last 24 hours) at 06/04/17 1910 Last data filed at 06/04/17 1830  Gross per 24 hour  Intake              665 ml  Output                0 ml  Net              665 ml   LBM: Last BM Date:  06/03/17 Baseline Weight: Weight: 75.3 kg (166 lb) Most recent weight: Weight: 80.2 kg (176 lb 12.9 oz)       Palliative Assessment/Data:    Flowsheet Rows     Most Recent Value  Intake Tab  Referral Department  Hospitalist  Unit at Time of Referral  Oncology Unit  Palliative Care Primary Diagnosis  Cancer  Palliative Care Type  New Palliative care  Reason for referral  Pain, Clarify Goals of Care  Date first seen by Palliative Care  06/03/17  Clinical Assessment  Palliative Performance Scale Score  30%  Pain Max last 24 hours  6  Pain Min Last 24 hours  5  Dyspnea Max Last 24 Hours  6  Dyspnea Min Last 24 hours  5  Nausea Max Last 24 Hours  3  Nausea Min Last 24 Hours  2  Anxiety Max Last 24 Hours  5  Anxiety Min Last 24 Hours  4  Psychosocial & Spiritual Assessment  Palliative Care Outcomes  Patient/Family meeting held?  Yes  Who was at the meeting?  wife patient pastor   Palliative Care Outcomes  Improved pain interventions, Clarified goals of care      Patient Active Problem List   Diagnosis Date Noted  . Sepsis ([place]) 06/03/2017  . Hyponatremia 06/03/2017  . Normocytic anemia 06/03/2017  . Encounter for palliative care   . Goals of care, counseling/discussion 04/22/2017  . Bacteremia due to Enterobacter species 04/15/2017  . History of Clostridium difficile colitis 04/15/2017  . Pulmonary nodule, right 02/09/2017  . Biliary obstruction   . Obstructive jaundice   . GERD (gastroesophageal reflux disease) 10/12/2016  . Hyperbilirubinemia 10/12/2016  . Port catheter in place 02/14/2016  . Malnutrition of moderate degree 01/21/2016  . Immunocompromised patient ([place])   . Viridans streptococci infection   . Bacteremia due to Klebsiella pneumoniae   . Central line infection   . Primary pancreatic cancer with metastasis to other site [facility])   . Cancer of head of pancreas ([place]) 11/24/2015  . Diabetes mellitus without complication ([place])   . Hyperlipidemia   .  Hypertension     Palliative Care Assessment & Plan   Patient Profile: 71 y.o. male  with past medical history of  metastatic pancreatic cancer on gemcitabine with complicated obstructive jaundice, external biliary drain and also stenting as recently as last month, HTN, and IDDMwho presents with malaise worsening over the last week. Now admitted on 06/03/2017   Recommendations/Plan:  I met today with Jacob Hardin and his wife.  We discussed options moving forward and he would like to pursue home with hospice support.  He spoke with Dr. [name] this AM, but family had questions about how services work and we reviewed this AM.  Care management to present hospice options.  He reports symptoms are currently well managed. Wants to maintain current medications today and see how he does.  Will plan to check in tomorrow prior to discharge to discuss potential medication changes again.  Code Status:    Code Status Orders        Start     Ordered   06/03/17 [phone]  Do not attempt resuscitation (DNR)  Continuous    Question Answer Comment  In the event of cardiac or respiratory ARREST Do not call a "code blue"   In the event of cardiac or respiratory ARREST Do not perform Intubation, CPR, defibrillation or ACLS   In the event of cardiac or respiratory ARREST Use medication by any route, position, wound care, and other measures to relive pain and suffering. May use oxygen, suction and manual treatment of airway obstruction as needed for comfort.      06/03/17 8242    Code Status History    Date Active Date Inactive Code Status Order ID Comments User Context   05/08/2017 11:15 PM 05/12/2017  1:54  PM DNR [id number]  Reubin Milan, MD Inpatient   04/12/2017  6:51 PM 04/16/2017  4:36 PM DNR [id number]  Verlee Monte, MD Inpatient   02/09/2017 10:31 AM 02/16/2017  4:27 PM DNR [id number]  Debbe Odea, MD ED   10/12/2016  7:57 PM 10/20/2016  6:35 PM Partial Code [id number]  Ivor Costa, MD ED    10/12/2016  7:52 PM 10/12/2016  7:57 PM Full Code [id number]  Ivor Costa, MD ED   01/17/2016  6:30 PM 01/22/2016  5:08 PM DNR [id number]  Domenic Polite, MD Inpatient       Prognosis:   < 6 months  Discharge Planning:  Home with Hospice  Care plan was discussed with patient, wife, Dr. Bonner Puna, Dr. [name]  Thank you for allowing the Palliative Medicine Team to assist in the care of this patient.   Time In: 10 Time Out: 1030 Total Time 30 Prolonged Time Billed No      Greater than 50%  of this time was spent counseling and coordinating care related to the above assessment and plan.  Micheline Rough, MD  Please contact Palliative Medicine Team phone at [phone] for questions and concerns.

## 2017-06-04 NOTE — Care Management CC44 (Signed)
Condition Code 44 Documentation Completed  Patient Details  Name: Jacob Hardin MRN: [id number] Date of Birth: 08-12-1946   Condition Code 44 given:  Yes Patient signature on Condition Code 44 notice:  Yes Documentation of 2 MD's agreement:  Yes Code 44 added to claim:  Yes    Purcell Mouton, RN 06/04/2017, 4:48 PM

## 2017-06-04 NOTE — Care Management Obs Status (Signed)
[place] NOTIFICATION   Patient Details  Name: Jacob Hardin MRN: [id number] Date of Birth: September 29, 1946   Medicare Observation Status Notification Given:  Yes    Purcell Mouton, RN 06/04/2017, 4:48 PM

## 2017-06-04 NOTE — Progress Notes (Signed)
Subjective: No complaints.  Breathing well.  Xanax has helped.  Objective: Vital signs in last 24 hours: Temp:  [97.6 F (36.4 C)-97.9 F (36.6 C)] 97.6 F (36.4 C) (08/14 1421) Pulse Rate:  [79-83] 80 (08/14 1421) Resp:  [14-16] 16 (08/14 1421) BP: (117-122)/(72-78) 117/73 (08/14 1421) SpO2:  [95 %-100 %] 96 % (08/14 1421) Last BM Date: 06/03/17  Intake/Output from previous day: 08/13 0701 - 08/14 0700 In: 725 [P.O.:175; IV Piggyback:550] Out: -  Intake/Output this shift: No intake/output data recorded.  General appearance: fatigued and weak appearing GI: soft, non-tender; bowel sounds normal; no masses,  no organomegaly  Lab Results:  Recent Labs  06/03/17 0210 06/04/17 0414  WBC 32.8* 22.5*  HGB 9.2* 8.4*  HCT 26.5* 25.0*  PLT 407* 311   BMET  Recent Labs  06/03/17 0210 06/04/17 0414  NA 128* 127*  K 5.2* 4.5  CL 93* 94*  CO2 23 24  GLUCOSE 153* 213*  BUN 20 18  CREATININE 1.04 1.02  CALCIUM 8.2* 7.7*   LFT  Recent Labs  06/04/17 0414  PROT 5.5*  ALBUMIN 1.5*  AST 50*  ALT 38  ALKPHOS 992*  BILITOT 4.7*   PT/INR  Recent Labs  06/03/17 0210  LABPROT 16.5*  INR 1.32   Hepatitis Panel No results for input(s): HEPBSAG, HCVAB, HEPAIGM, HEPBIGM in the last 72 hours. C-Diff No results for input(s): CDIFFTOX in the last 72 hours. Fecal Lactopherrin No results for input(s): FECLLACTOFRN in the last 72 hours.  Studies/Results: Ct Head Wo Contrast  Result Date: 06/03/2017 CLINICAL DATA:  Acute onset of confusion and slurred speech. Hypoglycemia. Generalized weakness. Expressive aphasia. Initial encounter. EXAM: CT HEAD WITHOUT CONTRAST TECHNIQUE: Contiguous axial images were obtained from the base of the skull through the vertex without intravenous contrast. COMPARISON:  None. FINDINGS: Brain: No evidence of acute infarction, hemorrhage, hydrocephalus, extra-axial collection or mass lesion/mass effect. Mild periventricular white matter change  likely reflects small vessel ischemic microangiopathy. The posterior fossa, including the cerebellum, brainstem and fourth ventricle, is within normal limits. The third and lateral ventricles, and basal ganglia are unremarkable in appearance. The cerebral hemispheres are symmetric in appearance, with normal gray-white differentiation. No mass effect or midline shift is seen. Vascular: No hyperdense vessel or unexpected calcification. Skull: There is no evidence of fracture; visualized osseous structures are unremarkable in appearance. Sinuses/Orbits: The orbits are within normal limits. The paranasal sinuses and mastoid air cells are well-aerated. Other: No significant soft tissue abnormalities are seen. IMPRESSION: 1. No acute intracranial pathology seen on CT. 2. Mild small vessel ischemic microangiopathy. Electronically Signed   By: Garald Balding M.D.   On: 06/03/2017 04:12   Ct Angio Chest Pe W And/or Wo Contrast  Result Date: 06/03/2017 CLINICAL DATA:  71 y/o M; pancreatic cancer with Mets to the liver with fluid removed from the abdomen last week. Recurrent abdominal distention, orthopnea, ongoing chemotherapy, elevated bilirubin, chest pain, and shortness of breath. EXAM: CT ANGIOGRAPHY CHEST CT ABDOMEN AND PELVIS WITH CONTRAST TECHNIQUE: Multidetector CT imaging of the chest was performed using the standard protocol during bolus administration of intravenous contrast. Multiplanar CT image reconstructions and MIPs were obtained to evaluate the vascular anatomy. Multidetector CT imaging of the abdomen and pelvis was performed using the standard protocol during bolus administration of intravenous contrast. CONTRAST:  100 cc Isovue 370 COMPARISON:  None. FINDINGS: CTA CHEST FINDINGS Cardiovascular: Satisfactory opacification of the pulmonary arteries to the segmental level. No evidence of pulmonary embolism. Mild cardiomegaly.  Severe coronary artery calcification coronary artery calcification. Normal  caliber thoracic aorta with mild calcific atherosclerosis. Mediastinum/Nodes: Prominent subcentimeter mediastinal lymph nodes, possibly reactive or due to edema. Normal thoracic esophagus. Normal thyroid gland. Lungs/Pleura: Trace right and moderate left pleural effusions. Calcified plaque along the right lateral chest wall, probably related to prior asbestos exposure. Partial atelectasis of lower lobes bilaterally likely secondary pleural effusions. No pulmonary metastatic disease identified. Musculoskeletal: No chest wall abnormality. No acute or significant osseous findings. Review of the MIP images confirms the above findings. CT ABDOMEN and PELVIS FINDINGS Hepatobiliary: Numerous liver metastasis are present, some of which are overall stable to mildly decreased in size in comparison to prior CT of the abdomen and pelvis, for example the previous dominant lesion in segment 4B now measures 2.0 cm (series 12, image 25). Normal gallbladder. Transhepatic common bile duct catheter and common bile duct stent in situ unchanged in position in comparison with the prior CT of abdomen and pelvis. Pain main portal vein and superior mesenteric vein. Stable occlusion of the splenic vein. Pancreas: Ill-defined mass in head of pancreas is stable. Atrophy of pancreatic body and tail with main duct dilatation is stable. Interval development of irregular rim enhancing masslike lesions within the lesser sac extending over the body of the stomach into the left upper quadrant. Lesions in the lesser sac measure 4.1 x 2.6 cm and 5.1 x 3.3 cm in the axial plane (series 12, image 32) and anterior to the gastric body the lesion measures up to 6.7 x 4.9 cm in the axial plane (series 12, image 21). Spleen: Normal in size without focal abnormality. Adrenals/Urinary Tract: No focal kidney lesion, or hydronephrosis is identified. Normal appearance of the bladder. Bilateral nonspecific adrenal hypertrophy. Stomach/Bowel: Stent within the  gastric antrum extending into first and second portions of the duodenum. Irregular soft tissue thickening within the walls of gastric antrum, lesser curvature of the stomach, and proximal duodenum has worsened in comparison with the prior CT of abdomen and pelvis. Large volume of stool throughout the colon. No apparent obstructive or inflammatory changes of the small and large bowel. Vascular/Lymphatic: Severe calcification of the abdominal aorta. Prominent stable upper retroperitoneal lymph nodes and periportal lymph nodes. Reproductive: Negative. Other: Large volume of ascites is much increased in comparison with the prior CT of abdomen and pelvis. Musculoskeletal: With Review of the MIP images confirms the above findings. IMPRESSION: CTA chest: 1. No pulmonary embolus identified. 2. Low lung volumes likely due to large volume ascites. 3. Trace right and moderate left pleural effusion. 4. Partial compressive atelectasis of lower lobes. 5. Mild cardiomegaly and severe coronary artery calcification. CT abdomen and pelvis: 1. Interval development of irregular rim enhancing masslike lesions within the lesser sac along lesser curvature of stomach and extending anterior to the gastric body into the left upper quadrant measuring up to 6.7 cm in the axial plane. Differential includes interval development of necrotic metastasis, acute peripancreatic collections, and/or abscess. 2. Worsening wall thickening along gastric antrum and proximal duodenum which may represent local progression of disease or superimposed inflammation. 3. Liver metastasis are stable to mildly decreased in size. 4. Large volume of ascites is increased from prior CT. Electronically Signed   By: Kristine Garbe M.D.   On: 06/03/2017 04:22   Ct Abdomen Pelvis W Contrast  Result Date: 06/03/2017 CLINICAL DATA:  71 y/o M; pancreatic cancer with Mets to the liver with fluid removed from the abdomen last week. Recurrent abdominal distention,  orthopnea, ongoing chemotherapy,  elevated bilirubin, chest pain, and shortness of breath. EXAM: CT ANGIOGRAPHY CHEST CT ABDOMEN AND PELVIS WITH CONTRAST TECHNIQUE: Multidetector CT imaging of the chest was performed using the standard protocol during bolus administration of intravenous contrast. Multiplanar CT image reconstructions and MIPs were obtained to evaluate the vascular anatomy. Multidetector CT imaging of the abdomen and pelvis was performed using the standard protocol during bolus administration of intravenous contrast. CONTRAST:  100 cc Isovue 370 COMPARISON:  None. FINDINGS: CTA CHEST FINDINGS Cardiovascular: Satisfactory opacification of the pulmonary arteries to the segmental level. No evidence of pulmonary embolism. Mild cardiomegaly. Severe coronary artery calcification coronary artery calcification. Normal caliber thoracic aorta with mild calcific atherosclerosis. Mediastinum/Nodes: Prominent subcentimeter mediastinal lymph nodes, possibly reactive or due to edema. Normal thoracic esophagus. Normal thyroid gland. Lungs/Pleura: Trace right and moderate left pleural effusions. Calcified plaque along the right lateral chest wall, probably related to prior asbestos exposure. Partial atelectasis of lower lobes bilaterally likely secondary pleural effusions. No pulmonary metastatic disease identified. Musculoskeletal: No chest wall abnormality. No acute or significant osseous findings. Review of the MIP images confirms the above findings. CT ABDOMEN and PELVIS FINDINGS Hepatobiliary: Numerous liver metastasis are present, some of which are overall stable to mildly decreased in size in comparison to prior CT of the abdomen and pelvis, for example the previous dominant lesion in segment 4B now measures 2.0 cm (series 12, image 25). Normal gallbladder. Transhepatic common bile duct catheter and common bile duct stent in situ unchanged in position in comparison with the prior CT of abdomen and pelvis. Pain  main portal vein and superior mesenteric vein. Stable occlusion of the splenic vein. Pancreas: Ill-defined mass in head of pancreas is stable. Atrophy of pancreatic body and tail with main duct dilatation is stable. Interval development of irregular rim enhancing masslike lesions within the lesser sac extending over the body of the stomach into the left upper quadrant. Lesions in the lesser sac measure 4.1 x 2.6 cm and 5.1 x 3.3 cm in the axial plane (series 12, image 32) and anterior to the gastric body the lesion measures up to 6.7 x 4.9 cm in the axial plane (series 12, image 21). Spleen: Normal in size without focal abnormality. Adrenals/Urinary Tract: No focal kidney lesion, or hydronephrosis is identified. Normal appearance of the bladder. Bilateral nonspecific adrenal hypertrophy. Stomach/Bowel: Stent within the gastric antrum extending into first and second portions of the duodenum. Irregular soft tissue thickening within the walls of gastric antrum, lesser curvature of the stomach, and proximal duodenum has worsened in comparison with the prior CT of abdomen and pelvis. Large volume of stool throughout the colon. No apparent obstructive or inflammatory changes of the small and large bowel. Vascular/Lymphatic: Severe calcification of the abdominal aorta. Prominent stable upper retroperitoneal lymph nodes and periportal lymph nodes. Reproductive: Negative. Other: Large volume of ascites is much increased in comparison with the prior CT of abdomen and pelvis. Musculoskeletal: With Review of the MIP images confirms the above findings. IMPRESSION: CTA chest: 1. No pulmonary embolus identified. 2. Low lung volumes likely due to large volume ascites. 3. Trace right and moderate left pleural effusion. 4. Partial compressive atelectasis of lower lobes. 5. Mild cardiomegaly and severe coronary artery calcification. CT abdomen and pelvis: 1. Interval development of irregular rim enhancing masslike lesions within the  lesser sac along lesser curvature of stomach and extending anterior to the gastric body into the left upper quadrant measuring up to 6.7 cm in the axial plane. Differential includes interval development  of necrotic metastasis, acute peripancreatic collections, and/or abscess. 2. Worsening wall thickening along gastric antrum and proximal duodenum which may represent local progression of disease or superimposed inflammation. 3. Liver metastasis are stable to mildly decreased in size. 4. Large volume of ascites is increased from prior CT. Electronically Signed   By: Kristine Garbe M.D.   On: 06/03/2017 04:22   US Paracentesis  Result Date: 06/03/2017 INDICATION: Stage IV pancreatic cancer, recurrent ascites. Request made for diagnostic and therapeutic paracentesis. EXAM: ULTRASOUND GUIDED DIAGNOSTIC AND THERAPEUTIC PARACENTESIS MEDICATIONS: None. COMPLICATIONS: None immediate. PROCEDURE: Informed written consent was obtained from the patient after a discussion of the risks, benefits and alternatives to treatment. A timeout was performed prior to the initiation of the procedure. Initial ultrasound scanning demonstrates a moderate amount of ascites within the left lower abdominal quadrant. The left lower abdomen was prepped and draped in the usual sterile fashion. 1% lidocaine was used for local anesthesia. Following this, a Yueh catheter was introduced. An ultrasound image was saved for documentation purposes. The paracentesis was performed. The catheter was removed and a dressing was applied. The patient tolerated the procedure well without immediate post procedural complication. FINDINGS: A total of approximately 3 liters of slightly hazy, yellow fluid was removed. Samples were sent to the laboratory as requested by the clinical team. IMPRESSION: Successful ultrasound-guided diagnostic and therapeutic paracentesis yielding 3 liters of peritoneal fluid. Read by: Rowe Lambert, PA-C Electronically Signed    By: Sandi Mariscal M.D.   On: 06/03/2017 12:19    Medications:  Scheduled: . insulin aspart  0-5 Units Subcutaneous QHS  . insulin aspart  0-9 Units Subcutaneous TID WC  . insulin detemir  25 Units Subcutaneous Daily  . levothyroxine  25 mcg Oral QAC breakfast  . lipase/protease/amylase  36,000 Units Oral TID AC   Continuous:   Assessment/Plan: 1) Metastatic pancreatic cancer. 2) SBP. 3) Exudative ascites.   The patient's SAAG is <1.1 and there is SBP as his WBC count is 60% PMNs (1,000+ PMN).  He denies any change in his ascites at this time.  I do believe the source is malignant, but last week's cytology did not reveal any malignant cells.  If he has rapid accumulation of his ascites then a peritoneal drainage port can be placed.  I did speak with IR and placing a drain is a viable option.  It is difficult to discern how fast the reaccumulation will  occur.  Plan: 1) Agree with Hospice. 2) Monitor the rate of recurrence of the ascites.  LOS: 1 day   Lizzie Cokley D 06/04/2017, 2:56 PM

## 2017-06-04 NOTE — Progress Notes (Signed)
PROGRESS NOTE  Subjective: Jacob Hardin is a 71 y.o. male with a history of metastatic pancreatic CA complicated by obstructive jaundice and recurrent sepsis/bacteremia since biliary drain placement earlier this year who presented early this morning for worsening generalized weakness, fatigue, and abdominal distention. His wife reported confusion as cause for ED presentation. On arrival, HR 105, RR 21, BP 125/80, afebrile. Na 128, K 5.2, SCr 1.04 (up from baseline 0.8), WBC 32.8 with bandemia, and hgb 9.2. Lactate was 3.2. CT abdomen showed mderate left pleural effusion, ascites, and new rim-enhancing lesions near stomach. Vancomycin, ceftriaxone, and flagyl were given, IV fluids started, and he was admitted 8/13. Had 3L drawn off by paracentesis with subjective improvement. Due to continued symptom burden and incurable nature of underlying illness, palliative care has been consulted and the patient is referred for home hospice.   Recent previous history: After improving some since discharge 7/22 he had worsening abdominal swelling, had an oncology visit 7/30 where he was weak. Chemotherapy held due to anemia, 2u PRBCs given, and started on levaquin for ?recurrent cholangitis. Blood cultures were drawn, ultimately grew lactobacillus and after discussion with ID, was changed to vancomycin 8/3. Paracentesis performed 8/7 had GPRs on gram stain, culture positive for Corynebacterium for which penicillin was started.   Objective: BP 120/72 (BP Location: Left Arm)   Pulse 83   Temp 97.7 F (36.5 C) (Oral)   Resp 14   Ht 6' (1.829 m)   Wt 80.2 kg (176 lb 12.9 oz)   SpO2 95%   BMI 23.98 kg/m   Gen: Frail, ill-appearing male in no distress walking slowly with IV pole Pulm: Clear and nonlabored  CV: RRR, no murmur, no JVD, no edema GI: Soft, distended, nontender, +BS Neuro: Alert and oriented. No focal deficits, no asterixis. Skin: Diffusely jaundiced. External biliary drain site c/d/i, nontender,  otherwise without wounds.  Assessment & Plan: Sepsis: with possible intra-abdominal source (abscess, cholangitis, SBP). - Will discontinue antibiotics and monitor. Blood cultures and peritoneal gram stain/culture are negative.  - Stop IVF's with recurrent ascites.  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  - Diagnostic and therapeutic paracentesis 8/13 w/3L out.  -Plan to repeat therapeutic paracentesis 8/15 and discharge home with hospice if stable. Oncology has voiced concern that risk of infection with peritoneal drainage catheter is too high to have this placed currently.   Obstructive jaundice: TBili stable in 4's. Due to extensive hepatic metastases obstructing small ducts.  - No further interventions available per GI, Dr. [name].  - Continue biliary drainage catheter.   Stage IV pancreatic cancer:  - Oncology and GI recommending home hospice. Selected [facility]. Palliative care consulted and goals of care discussions are ongoing.  - Continue IV dilaudid and po norco prn pain. - Continue benzodiazepine prn anxiety  Hyponatremia: Chronic, stable, mild.  - Monitor  Secondary diabetes: Well-controlled (HbA1c 6.6%) - Continuing levemir, administering SSI qAC/HS  Hypothyroidism: Recently started synthroid due to elevated TSH (24) in June 2018.  - Continue synthroid - Recheck TSH, T3, T4 as outpatient if indicated.   Normocytic anemia: Related to chronic disease and chemotherapy. Required transfusions recently, currently stable.  - Monitor  Vance Gather, MD [facility] Pager [phone] 06/04/2017, 12:34 PM

## 2017-06-04 NOTE — Telephone Encounter (Signed)
FYI: Jacob Hardin called and left a message patient has been in hospital and will be discharged 8/15 from [facility] and patient is req Pickard be the attending physician.

## 2017-06-04 NOTE — Care Management Obs Status (Signed)
[facility] NOTIFICATION   Patient Details  Name: KEGHAN MCFARREN MRN: [id number] Date of Birth: 07/28/1946   Medicare Observation Status Notification Given:  Yes    Purcell Mouton, RN 06/04/2017, 4:48 PM

## 2017-06-05 ENCOUNTER — Observation Stay (HOSPITAL_COMMUNITY): Payer: Medicare Other

## 2017-06-05 ENCOUNTER — Telehealth: Payer: Self-pay | Admitting: Oncology

## 2017-06-05 DIAGNOSIS — K831 Obstruction of bile duct: Secondary | ICD-10-CM | POA: Diagnosis not present

## 2017-06-05 DIAGNOSIS — R180 Malignant ascites: Secondary | ICD-10-CM | POA: Diagnosis not present

## 2017-06-05 DIAGNOSIS — C250 Malignant neoplasm of head of pancreas: Secondary | ICD-10-CM | POA: Diagnosis not present

## 2017-06-05 DIAGNOSIS — A419 Sepsis, unspecified organism: Secondary | ICD-10-CM | POA: Diagnosis not present

## 2017-06-05 DIAGNOSIS — C259 Malignant neoplasm of pancreas, unspecified: Secondary | ICD-10-CM | POA: Diagnosis not present

## 2017-06-05 LAB — GLUCOSE, CAPILLARY
Glucose-Capillary: 197 mg/dL — ABNORMAL HIGH (ref 65–99)
Glucose-Capillary: 226 mg/dL — ABNORMAL HIGH (ref 65–99)

## 2017-06-05 MED ORDER — LIDOCAINE HCL 1 % IJ SOLN
INTRAMUSCULAR | Status: AC
  Filled 2017-06-05: qty 20

## 2017-06-05 MED ORDER — OXYCODONE HCL 5 MG PO CAPS: 5.0000 mg | ORAL_CAPSULE | ORAL | 0 refills | Status: DC | PRN

## 2017-06-05 MED ORDER — ALPRAZOLAM 0.25 MG PO TABS: 0.2500 mg | ORAL_TABLET | Freq: Three times a day (TID) | ORAL | 0 refills | Status: DC | PRN

## 2017-06-05 MED ORDER — HEPARIN SOD (PORK) LOCK FLUSH 100 UNIT/ML IV SOLN
500.0000 [IU] | INTRAVENOUS | Status: AC | PRN
  Administered 2017-06-05: 500 [IU]

## 2017-06-05 NOTE — Discharge Summary (Signed)
Physician Discharge Summary  Jacob Hardin IWL:[id number] DOB: [birth year] DOA: 06/03/2017  PCP: [name], MD  Admit date: 06/03/2017 Discharge date: 06/05/2017  Admitted From: Home Disposition: Home with Hospice   Recommendations for Outpatient Follow-up:  1. To receive home hospice services, arranged prior to discharge. Dr. [name] will follow him.   Home Health: Hospice. Equipment/Devices: None new Discharge Condition: Stable for transfer. Prognosis < 6 months. CODE STATUS: DNR Diet recommendation: As tolerated.  Brief/Interim Summary: Jacob Hardin is a 71 y.o. male with a history of metastatic pancreatic CA complicated by obstructive jaundice and recurrent sepsis/bacteremia since biliary drain placement earlier this year who presented early this morning for worsening generalized weakness, fatigue, and abdominal distention. His wife reported confusion as cause for ED presentation. On arrival, HR 105, RR 21, BP 125/80, afebrile. Na 128, K 5.2, SCr 1.04 (up from baseline 0.8), WBC 32.8 with bandemia, and hgb 9.2. Lactate was 3.2. CT abdomen showed mderate left pleural effusion, ascites, and new rim-enhancing lesions near stomach. Vancomycin, ceftriaxone, and flagyl were given, IV fluids started, and he was admitted 8/13. Had 3L drawn off by paracentesis with subjective improvement. Due to continued symptom burden and incurable nature of underlying illness, palliative care has been consulted and the patient is referred for home hospice. Paracentesis was repeated prior to discharge yielding 2L on 8/15.   Recent previous history: After improving some since discharge 7/22 he had worsening abdominal swelling, had an oncology visit 7/30 where he was weak. Chemotherapy held due to anemia, 2u PRBCs given, and started on levaquin for ?recurrent cholangitis. Blood cultures were drawn, ultimately grew lactobacillus and after discussion with ID, was changed to vancomycin 8/3. Paracentesis  performed 8/7 had GPRs on gram stain, culture positive for Corynebacterium for which penicillin was started.   Discharge Diagnoses:  Principal Problem:   Sepsis ([place]) Active Problems:   Diabetes mellitus without complication ([place])   Hypertension   Cancer of head of pancreas ([place])   Malnutrition of moderate degree   Biliary obstruction   Hyponatremia   Normocytic anemia   Encounter for palliative care  Sepsis: with possible intra-abdominal source (abscess, cholangitis, SBP). - Will discontinue antibiotics and monitor. Blood cultures and peritoneal gram stain/culture are negative.   Ascites:Presumed malignant, recurrent.  - Diagnostic and therapeutic paracentesis 8/13 w/3L out - transudate, no SBP.  - Repeated therapeutic paracentesis 8/15 w/2L out.  and discharge home with hospice.  - Oncology has voiced concern that risk of infection with peritoneal drainage catheter is too high to have this placed currently. Will continue following. - Will continue penicillin as outpatient.   Obstructive jaundice:TBili stable in 4's. Due to extensive hepatic metastases obstructing small ducts.  - No further interventions available per GI, Dr. [name].  - Continue biliary drainage catheter.   Stage IV pancreatic cancer: - Oncology and GI recommending home hospice. Selected [facility]. Palliative care consulted and goals of care discussions are ongoing.  - Will switch to oxyIR prn pain given hepatic impairment, continue antiemetics which he has at home - Continue benzodiazepine prn anxiety  Hyponatremia: Chronic, stable, mild.  - Monitor  Secondary diabetes: Well-controlled (HbA1c 6.6%) - Continuing levemir, administering SSI qAC/HS  Hypothyroidism: Recently started synthroid due to elevated TSH (24) in June 2018.  - Continue synthroid - Recheck TSH, T3, T4 as outpatient if indicated.   Normocytic anemia: Related to chronic disease and chemotherapy. Required transfusions recently,  currently stable.  - Monitor  Discharge Instructions Discharge Instructions  Discharge instructions    Complete by:  As directed    - Home hospice will follow up with you for ongoing symptom management - Continue taking penicillin as you were - For pain control, tylenol, vicodin, and ultram were stopped and oxycodone was prescribed. Hospice can continue changing this as needed.  - Continue xanax as directed, a new prescription was provided.     Allergies as of 06/05/2017      Reactions   Codeine Nausea Only      Medication List    STOP taking these medications   acetaminophen 500 MG tablet Commonly known as:  TYLENOL   HYDROcodone-acetaminophen 5-325 MG tablet Commonly known as:  NORCO   traMADol 50 MG tablet Commonly known as:  ULTRAM     TAKE these medications   ALPRAZolam 0.25 MG tablet Commonly known as:  XANAX Take 1 tablet (0.25 mg total) by mouth 3 (three) times daily as needed for anxiety. What changed:  when to take this  reasons to take this   baclofen 10 MG tablet Commonly known as:  LIORESAL Take 5 mg by mouth 3 (three) times daily as needed (for hiccups).   BD PEN NEEDLE NANO U/F 32G X 4 MM Misc Generic drug:  Insulin Pen Needle USE TO INJECT INSULIN TWICE A DAY   ibuprofen 200 MG tablet Commonly known as:  ADVIL,MOTRIN Take 400 mg by mouth every 6 (six) hours as needed for headache, mild pain or moderate pain.   Insulin Detemir 100 UNIT/ML Pen Commonly known as:  LEVEMIR FLEXTOUCH Inject 25 Units into the skin daily.   levothyroxine 25 MCG tablet Commonly known as:  SYNTHROID, LEVOTHROID Take 1 tablet (25 mcg total) by mouth daily before breakfast.   lidocaine-prilocaine cream Commonly known as:  EMLA Apply 1 application topically as needed (prior to accessing port).   lipase/protease/amylase 36000 UNITS Cpep capsule Commonly known as:  CREON Take 1 capsule (36,000 Units total) by mouth 3 (three) times daily before meals.    loratadine 10 MG tablet Commonly known as:  CLARITIN Take 10 mg by mouth daily.   multivitamin with minerals Tabs tablet Take 1 tablet by mouth daily.   NOVOLOG FLEXPEN 100 UNIT/ML FlexPen Generic drug:  insulin aspart Inject 4 Units into the skin daily before supper.   ondansetron 8 MG tablet Commonly known as:  ZOFRAN Take 1 tablet (8 mg total) by mouth every 8 (eight) hours as needed for nausea or vomiting.   ONE TOUCH ULTRA TEST test strip Generic drug:  glucose blood TEST TWICE A DAY   oxycodone 5 MG capsule Commonly known as:  OXY-IR Take 1 capsule (5 mg total) by mouth every 4 (four) hours as needed for pain.   penicillin v potassium 500 MG tablet Commonly known as:  VEETID Take 1 tablet (500 mg total) by mouth 4 (four) times daily.   polyethylene glycol packet Commonly known as:  MIRALAX / GLYCOLAX Take 17 g by mouth daily as needed for mild constipation.   prochlorperazine 10 MG tablet Commonly known as:  COMPAZINE Take 1 tablet (10 mg total) by mouth every 6 (six) hours as needed for nausea or vomiting.      Follow-up Information    [name], MD Follow up.   Specialty:  Family Medicine Contact information: [address] [place] [id number] [phone]        Ladell Pier, MD Follow up.   Specialty:  Oncology Contact information: [address]  [place] [address] [phone]          Allergies  Allergen Reactions  . Codeine Nausea Only    Consultations:  Oncology, Dr. [name]  GI, Dr. [name]  Palliative Care, Dr. Domingo Cocking  Procedures/Studies: Ct Head Wo Contrast  Result Date: 06/03/2017 CLINICAL DATA:  Acute onset of confusion and slurred speech. Hypoglycemia. Generalized weakness. Expressive aphasia. Initial encounter. EXAM: CT HEAD WITHOUT CONTRAST TECHNIQUE: Contiguous axial images were obtained from the base of the skull through the vertex without intravenous contrast. COMPARISON:  None.  FINDINGS: Brain: No evidence of acute infarction, hemorrhage, hydrocephalus, extra-axial collection or mass lesion/mass effect. Mild periventricular white matter change likely reflects small vessel ischemic microangiopathy. The posterior fossa, including the cerebellum, brainstem and fourth ventricle, is within normal limits. The third and lateral ventricles, and basal ganglia are unremarkable in appearance. The cerebral hemispheres are symmetric in appearance, with normal gray-white differentiation. No mass effect or midline shift is seen. Vascular: No hyperdense vessel or unexpected calcification. Skull: There is no evidence of fracture; visualized osseous structures are unremarkable in appearance. Sinuses/Orbits: The orbits are within normal limits. The paranasal sinuses and mastoid air cells are well-aerated. Other: No significant soft tissue abnormalities are seen. IMPRESSION: 1. No acute intracranial pathology seen on CT. 2. Mild small vessel ischemic microangiopathy. Electronically Signed   By: Garald Balding M.D.   On: 06/03/2017 04:12   Ct Angio Chest Pe W And/or Wo Contrast  Result Date: 06/03/2017 CLINICAL DATA:  71 y/o M; pancreatic cancer with Mets to the liver with fluid removed from the abdomen last week. Recurrent abdominal distention, orthopnea, ongoing chemotherapy, elevated bilirubin, chest pain, and shortness of breath. EXAM: CT ANGIOGRAPHY CHEST CT ABDOMEN AND PELVIS WITH CONTRAST TECHNIQUE: Multidetector CT imaging of the chest was performed using the standard protocol during bolus administration of intravenous contrast. Multiplanar CT image reconstructions and MIPs were obtained to evaluate the vascular anatomy. Multidetector CT imaging of the abdomen and pelvis was performed using the standard protocol during bolus administration of intravenous contrast. CONTRAST:  100 cc Isovue 370 COMPARISON:  None. FINDINGS: CTA CHEST FINDINGS Cardiovascular: Satisfactory opacification of the pulmonary  arteries to the segmental level. No evidence of pulmonary embolism. Mild cardiomegaly. Severe coronary artery calcification coronary artery calcification. Normal caliber thoracic aorta with mild calcific atherosclerosis. Mediastinum/Nodes: Prominent subcentimeter mediastinal lymph nodes, possibly reactive or due to edema. Normal thoracic esophagus. Normal thyroid gland. Lungs/Pleura: Trace right and moderate left pleural effusions. Calcified plaque along the right lateral chest wall, probably related to prior asbestos exposure. Partial atelectasis of lower lobes bilaterally likely secondary pleural effusions. No pulmonary metastatic disease identified. Musculoskeletal: No chest wall abnormality. No acute or significant osseous findings. Review of the MIP images confirms the above findings. CT ABDOMEN and PELVIS FINDINGS Hepatobiliary: Numerous liver metastasis are present, some of which are overall stable to mildly decreased in size in comparison to prior CT of the abdomen and pelvis, for example the previous dominant lesion in segment 4B now measures 2.0 cm (series 12, image 25). Normal gallbladder. Transhepatic common bile duct catheter and common bile duct stent in situ unchanged in position in comparison with the prior CT of abdomen and pelvis. Pain main portal vein and superior mesenteric vein. Stable occlusion of the splenic vein. Pancreas: Ill-defined mass in head of pancreas is stable. Atrophy of pancreatic body and tail with main duct dilatation is stable. Interval development of irregular rim enhancing masslike lesions within the lesser sac extending over the body  of the stomach into the left upper quadrant. Lesions in the lesser sac measure 4.1 x 2.6 cm and 5.1 x 3.3 cm in the axial plane (series 12, image 32) and anterior to the gastric body the lesion measures up to 6.7 x 4.9 cm in the axial plane (series 12, image 21). Spleen: Normal in size without focal abnormality. Adrenals/Urinary Tract: No focal  kidney lesion, or hydronephrosis is identified. Normal appearance of the bladder. Bilateral nonspecific adrenal hypertrophy. Stomach/Bowel: Stent within the gastric antrum extending into first and second portions of the duodenum. Irregular soft tissue thickening within the walls of gastric antrum, lesser curvature of the stomach, and proximal duodenum has worsened in comparison with the prior CT of abdomen and pelvis. Large volume of stool throughout the colon. No apparent obstructive or inflammatory changes of the small and large bowel. Vascular/Lymphatic: Severe calcification of the abdominal aorta. Prominent stable upper retroperitoneal lymph nodes and periportal lymph nodes. Reproductive: Negative. Other: Large volume of ascites is much increased in comparison with the prior CT of abdomen and pelvis. Musculoskeletal: With Review of the MIP images confirms the above findings. IMPRESSION: CTA chest: 1. No pulmonary embolus identified. 2. Low lung volumes likely due to large volume ascites. 3. Trace right and moderate left pleural effusion. 4. Partial compressive atelectasis of lower lobes. 5. Mild cardiomegaly and severe coronary artery calcification. CT abdomen and pelvis: 1. Interval development of irregular rim enhancing masslike lesions within the lesser sac along lesser curvature of stomach and extending anterior to the gastric body into the left upper quadrant measuring up to 6.7 cm in the axial plane. Differential includes interval development of necrotic metastasis, acute peripancreatic collections, and/or abscess. 2. Worsening wall thickening along gastric antrum and proximal duodenum which may represent local progression of disease or superimposed inflammation. 3. Liver metastasis are stable to mildly decreased in size. 4. Large volume of ascites is increased from prior CT. Electronically Signed   By: Kristine Garbe M.D.   On: 06/03/2017 04:22   Ct Abdomen Pelvis W Contrast  Result Date:  06/03/2017 CLINICAL DATA:  71 y/o M; pancreatic cancer with Mets to the liver with fluid removed from the abdomen last week. Recurrent abdominal distention, orthopnea, ongoing chemotherapy, elevated bilirubin, chest pain, and shortness of breath. EXAM: CT ANGIOGRAPHY CHEST CT ABDOMEN AND PELVIS WITH CONTRAST TECHNIQUE: Multidetector CT imaging of the chest was performed using the standard protocol during bolus administration of intravenous contrast. Multiplanar CT image reconstructions and MIPs were obtained to evaluate the vascular anatomy. Multidetector CT imaging of the abdomen and pelvis was performed using the standard protocol during bolus administration of intravenous contrast. CONTRAST:  100 cc Isovue 370 COMPARISON:  None. FINDINGS: CTA CHEST FINDINGS Cardiovascular: Satisfactory opacification of the pulmonary arteries to the segmental level. No evidence of pulmonary embolism. Mild cardiomegaly. Severe coronary artery calcification coronary artery calcification. Normal caliber thoracic aorta with mild calcific atherosclerosis. Mediastinum/Nodes: Prominent subcentimeter mediastinal lymph nodes, possibly reactive or due to edema. Normal thoracic esophagus. Normal thyroid gland. Lungs/Pleura: Trace right and moderate left pleural effusions. Calcified plaque along the right lateral chest wall, probably related to prior asbestos exposure. Partial atelectasis of lower lobes bilaterally likely secondary pleural effusions. No pulmonary metastatic disease identified. Musculoskeletal: No chest wall abnormality. No acute or significant osseous findings. Review of the MIP images confirms the above findings. CT ABDOMEN and PELVIS FINDINGS Hepatobiliary: Numerous liver metastasis are present, some of which are overall stable to mildly decreased in size in comparison to prior CT  of the abdomen and pelvis, for example the previous dominant lesion in segment 4B now measures 2.0 cm (series 12, image 25). Normal gallbladder.  Transhepatic common bile duct catheter and common bile duct stent in situ unchanged in position in comparison with the prior CT of abdomen and pelvis. Pain main portal vein and superior mesenteric vein. Stable occlusion of the splenic vein. Pancreas: Ill-defined mass in head of pancreas is stable. Atrophy of pancreatic body and tail with main duct dilatation is stable. Interval development of irregular rim enhancing masslike lesions within the lesser sac extending over the body of the stomach into the left upper quadrant. Lesions in the lesser sac measure 4.1 x 2.6 cm and 5.1 x 3.3 cm in the axial plane (series 12, image 32) and anterior to the gastric body the lesion measures up to 6.7 x 4.9 cm in the axial plane (series 12, image 21). Spleen: Normal in size without focal abnormality. Adrenals/Urinary Tract: No focal kidney lesion, or hydronephrosis is identified. Normal appearance of the bladder. Bilateral nonspecific adrenal hypertrophy. Stomach/Bowel: Stent within the gastric antrum extending into first and second portions of the duodenum. Irregular soft tissue thickening within the walls of gastric antrum, lesser curvature of the stomach, and proximal duodenum has worsened in comparison with the prior CT of abdomen and pelvis. Large volume of stool throughout the colon. No apparent obstructive or inflammatory changes of the small and large bowel. Vascular/Lymphatic: Severe calcification of the abdominal aorta. Prominent stable upper retroperitoneal lymph nodes and periportal lymph nodes. Reproductive: Negative. Other: Large volume of ascites is much increased in comparison with the prior CT of abdomen and pelvis. Musculoskeletal: With Review of the MIP images confirms the above findings. IMPRESSION: CTA chest: 1. No pulmonary embolus identified. 2. Low lung volumes likely due to large volume ascites. 3. Trace right and moderate left pleural effusion. 4. Partial compressive atelectasis of lower lobes. 5. Mild  cardiomegaly and severe coronary artery calcification. CT abdomen and pelvis: 1. Interval development of irregular rim enhancing masslike lesions within the lesser sac along lesser curvature of stomach and extending anterior to the gastric body into the left upper quadrant measuring up to 6.7 cm in the axial plane. Differential includes interval development of necrotic metastasis, acute peripancreatic collections, and/or abscess. 2. Worsening wall thickening along gastric antrum and proximal duodenum which may represent local progression of disease or superimposed inflammation. 3. Liver metastasis are stable to mildly decreased in size. 4. Large volume of ascites is increased from prior CT. Electronically Signed   By: Kristine Garbe M.D.   On: 06/03/2017 04:22   Ct Abdomen Pelvis W Contrast  Result Date: 05/09/2017 CLINICAL DATA:  Pt with medical history significant of elevated liver enzymes, GERD, hyperlipidemia, hypertension, obstructive jaundice, oxalate nephropathy, 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 Positive blood cultures with g negative rods drawn 2 days ago. EXAM: CT ABDOMEN AND PELVIS WITH CONTRAST TECHNIQUE: Multidetector CT imaging of the abdomen and pelvis was performed using the standard protocol following bolus administration of intravenous contrast. CONTRAST:  123mL ISOVUE-300 IOPAMIDOL (ISOVUE-300) INJECTION 61% COMPARISON:  CT abdomen dated 04/12/2017. FINDINGS: Lower chest: Lung bases are clear. Hepatobiliary: Diffuse metastases throughout the bilateral liver lobes. Many of the lesions have increased in size compared to the recent CT abdomen of 04/12/2017. Most conspicuous changes seen within the left liver lobe where essentially all of the previously demonstrated lesions in the left  liver lobe have enlarged in the interval. There is a new prominent lesion  within segment 4B of the left liver lobe which measures 4.3 cm greatest dimension. Again noted is pneumobilia. Percutaneous biliary drain appears stable in position, traversing a common bile duct stent which also appears stable in position. Gallbladder is not distended. Pancreas: Stable appearance of the pancreas, with pancreatic atrophy and dilatation of the main pancreatic duct. Spleen: Splenomegaly, stable. Adrenals/Urinary Tract: Adrenal glands are unremarkable. Kidneys appear normal without mass, stone or hydronephrosis. Bladder appears normal. Stomach/Bowel: No dilated large or small bowel loops. Walls of the right colon appear thickened, most prominent at the level of the cecum. Gastric stent stable in position. Vascular/Lymphatic: Aortic atherosclerosis. No enlarged lymph nodes seen within the abdomen or pelvis. Reproductive: Prostate is unremarkable. Other: Moderate amount of free fluid within the right upper quadrant, right pericolic gutter and pelvis, increased compared to the previous exam. No circumscribed abscess collection appreciated. No free intraperitoneal air seen. Musculoskeletal: No acute or suspicious osseous finding. Mild degenerative change within the lumbar spine. IMPRESSION: 1. Worsening liver metastases, many of the lesions have increased in size compared to the recent CT abdomen of 04/12/2017, and a new mass is identified within segment 4B of the left liver lobe which measures 4.3 cm. Alternatively, given the abnormal blood cultures, 1 or more of these liver lesions (including the new dominant lesion within the left liver lobe) could represent liver abscesses but this is considered less likely than worsening liver metastases. 2. Thickening of the walls of the right colon, particularly prominent thickening of the walls of the cecum, suggesting colitis of infectious or inflammatory nature. 3. Moderate amount of free fluid in the right upper quadrant, right pericolic gutter and pelvis,  increased compared to the previous CT. 4. Multiple stents and drains, stable in position, as detailed above. 5. Aortic atherosclerosis. Electronically Signed   By: Franki Cabot M.D.   On: 05/09/2017 17:39   US Paracentesis  Result Date: 06/05/2017 INDICATION: Pancreatic cancer, recurrent ascites. Request made for therapeutic paracentesis. EXAM: ULTRASOUND GUIDED THERAPEUTIC PARACENTESIS MEDICATIONS: None. COMPLICATIONS: None immediate. PROCEDURE: Informed written consent was obtained from the patient after a discussion of the risks, benefits and alternatives to treatment. A timeout was performed prior to the initiation of the procedure. Initial ultrasound scanning demonstrates a small to moderate amount of ascites within the right mid to lower abdominal quadrant. The right mid to lower abdomen was prepped and draped in the usual sterile fashion. 1% lidocaine was used for local anesthesia. Following this, a Yueh catheter was introduced. An ultrasound image was saved for documentation purposes. The paracentesis was performed. The catheter was removed and a dressing was applied. The patient tolerated the procedure well without immediate post procedural complication. FINDINGS: A total of approximately 2 liters of slightly hazy, yellow fluid was removed. IMPRESSION: Successful ultrasound-guided therapeutic paracentesis yielding 2 liters of peritoneal fluid. Read by: Rowe Kekai, PA-C Electronically Signed   By: Jerilynn Mages.  Shick M.D.   On: 06/05/2017 12:11   US Paracentesis  Result Date: 06/03/2017 INDICATION: Stage IV pancreatic cancer, recurrent ascites. Request made for diagnostic and therapeutic paracentesis. EXAM: ULTRASOUND GUIDED DIAGNOSTIC AND THERAPEUTIC PARACENTESIS MEDICATIONS: None. COMPLICATIONS: None immediate. PROCEDURE: Informed written consent was obtained from the patient after a discussion of the risks, benefits and alternatives to treatment. A timeout was performed prior to the initiation of the  procedure. Initial ultrasound scanning demonstrates a moderate amount of ascites within the left lower abdominal quadrant. The left  lower abdomen was prepped and draped in the usual sterile fashion. 1% lidocaine was used for local anesthesia. Following this, a Yueh catheter was introduced. An ultrasound image was saved for documentation purposes. The paracentesis was performed. The catheter was removed and a dressing was applied. The patient tolerated the procedure well without immediate post procedural complication. FINDINGS: A total of approximately 3 liters of slightly hazy, yellow fluid was removed. Samples were sent to the laboratory as requested by the clinical team. IMPRESSION: Successful ultrasound-guided diagnostic and therapeutic paracentesis yielding 3 liters of peritoneal fluid. Read by: Rowe Daelan, PA-C Electronically Signed   By: Sandi Mariscal M.D.   On: 06/03/2017 12:19   Ir Exchange Biliary Drain  Result Date: 05/10/2017 INDICATION: 70 year old male with a history of biliary obstruction, with previously placed common bile duct stent and internal/ external biliary drain. He presents with recurrent episode of cholangitis, with possible obstruction of the drain EXAM: IMAGE GUIDED DRAIN INJECTION AND EXCHANGE MEDICATIONS: 4 mg IV Zofran; The antibiotic was administered within an appropriate time frame prior to the initiation of the procedure. ANESTHESIA/SEDATION: None FLUOROSCOPY TIME:  Fluoroscopy Time: 1 minutes 12 seconds (24 mGy). COMPLICATIONS: None PROCEDURE: Informed written consent was obtained from the patient after a thorough discussion of the procedural risks, benefits and alternatives. All questions were addressed. Maximal Sterile Barrier Technique was utilized including caps, mask, sterile gowns, sterile gloves, sterile drape, hand hygiene and skin antiseptic. A timeout was performed prior to the initiation of the procedure. Patient positioned supine position on the IR table. Scout  images were acquired. Patient is prepped and draped. Contrast injection confirmed a least partial occlusion of the indwelling 12 French drain which was appropriately positioned across the stent into the duodenum. 1% lidocaine was used for local anesthesia. Using modified Seldinger technique, a new 14 French drain was placed. Final image was stored. Patient tolerated the procedure well and remained hemodynamically stable throughout. No complications were encountered and no significant blood loss. IMPRESSION: Status post exchange of partially obstructed internal external biliary drain with placement of a new 14 French drain. Signed, Dulcy Fanny. Earleen Newport, DO [facility] Electronically Signed   By: Corrie Mckusick D.O.   On: 05/10/2017 17:26   Ir Paracentesis  Result Date: 05/28/2017 INDICATION: Patient with history of pancreatic cancer, now with ascites. Request is made for diagnostic and therapeutic paracentesis of up to 5 liters. EXAM: ULTRASOUND GUIDED DIAGNOSTIC AND THERAPEUTIC PARACENTESIS MEDICATIONS: 10 mL 1% lidocaine COMPLICATIONS: None immediate. PROCEDURE: Informed written consent was obtained from the patient after a discussion of the risks, benefits and alternatives to treatment. A timeout was performed prior to the initiation of the procedure. Initial ultrasound scanning demonstrates a small amount of ascites within the left lateral abdomen. The left lateral abdomen was prepped and draped in the usual sterile fashion. 1% lidocaine was used for local anesthesia. Following this, a Safe-T-Centesis catheter was introduced. An ultrasound image was saved for documentation purposes. The paracentesis was performed. The catheter was removed and a dressing was applied. The patient tolerated the procedure well without immediate post procedural complication. FINDINGS: A total of approximately 1.8 liters of hazy, yellow fluid was removed. Samples were sent to the  laboratory as requested by the clinical team. IMPRESSION: Successful ultrasound-guided diagnostic and therapeutic paracentesis yielding 1.8 liters of peritoneal fluid. Read by:  Brynda Greathouse PA-C Electronically Signed   By: Sandi Mariscal M.D.   On: 05/28/2017 14:13   Subjective: Pain is controlled, xanax  has helped symptoms of anxiety. Abdominal swelling improved after paracentesis. No fever, cough, dysuria.   Discharge Exam: BP 118/71 (BP Location: Left Arm)   Pulse 80   Temp 97.7 F (36.5 C) (Oral)   Resp 18   Ht 6' (1.829 m)   Wt 80.2 kg (176 lb 12.9 oz)   SpO2 97%   BMI 23.98 kg/m   Gen: Frail, ill-appearing male in no distress walking slowly with IV pole Pulm: Clear and nonlabored  CV: RRR, no murmur, no JVD, no edema GI: Soft, distended, nontender, +BS Neuro: Alert and oriented. No focal deficits, no asterixis. Skin: Diffusely jaundiced. External biliary drain site c/d/i, nontender, otherwise without wounds.  Labs: Basic Metabolic Panel:  Recent Labs Lab 06/03/17 0210 06/04/17 0414  NA 128* 127*  K 5.2* 4.5  CL 93* 94*  CO2 23 24  GLUCOSE 153* 213*  BUN 20 18  CREATININE 1.04 1.02  CALCIUM 8.2* 7.7*   Liver Function Tests:  Recent Labs Lab 06/03/17 0210 06/04/17 0414  AST 72* 50*  ALT 43 38  ALKPHOS 1,305* 992*  BILITOT 4.7* 4.7*  PROT 6.5 5.5*  ALBUMIN 1.7* 1.5*    Recent Labs Lab 06/03/17 0210  AMMONIA 41*   CBC:  Recent Labs Lab 06/03/17 0210 06/04/17 0414  WBC 32.8* 22.5*  NEUTROABS 26.9*  --   HGB 9.2* 8.4*  HCT 26.5* 25.0*  MCV 87.2 87.7  PLT 407* 311   Urinalysis    Component Value Date/Time   COLORURINE AMBER (A) 06/03/2017 0315   APPEARANCEUR CLEAR 06/03/2017 0315   LABSPEC 1.017 06/03/2017 0315   PHURINE 5.0 06/03/2017 0315   GLUCOSEU NEGATIVE 06/03/2017 0315   HGBUR NEGATIVE 06/03/2017 0315   BILIRUBINUR SMALL (A) 06/03/2017 0315   KETONESUR NEGATIVE 06/03/2017 0315   PROTEINUR NEGATIVE 06/03/2017 0315   NITRITE  NEGATIVE 06/03/2017 0315   LEUKOCYTESUR NEGATIVE 06/03/2017 0315    Microbiology Recent Results (from the past 240 hour(s))  Culture, body fluid-bottle     Status: Abnormal   Collection Time: 05/28/17  2:18 PM  Result Value Ref Range Status   Specimen Description PERITONEAL  Final   Special Requests NONE  Final   Gram Stain   Final    GRAM POSITIVE RODS IN BOTH AEROBIC AND ANAEROBIC BOTTLES CRITICAL RESULT CALLED TO, READ BACK BY AND VERIFIED WITH: Ephriam Knuckles, RN ([place]) AT [place] ON 05/30/17 BY C. JESSUP, MLT CONCERNING GRAM STAIN.    Culture (A)  Final    DIPHTHEROIDS(CORYNEBACTERIUM SPECIES) Standardized susceptibility testing for this organism is not available.    Report Status 06/01/2017 FINAL  Final  Gram stain     Status: None   Collection Time: 05/28/17  2:18 PM  Result Value Ref Range Status   Specimen Description PERITONEAL  Final   Special Requests NONE  Final   Gram Stain   Final    MODERATE WBC PRESENT, PREDOMINANTLY PMN NO ORGANISMS SEEN    Report Status 05/28/2017 FINAL  Final  Urine culture     Status: None   Collection Time: 06/03/17  3:15 AM  Result Value Ref Range Status   Specimen Description URINE, RANDOM  Final   Special Requests NONE  Final   Culture   Final    NO GROWTH Performed at [facility], [place] [address], [place], [place] [address]    Report Status 06/04/2017 FINAL  Final  Blood Culture (routine x 2)     Status: None (Preliminary result)   Collection  Time: 06/03/17  3:58 AM  Result Value Ref Range Status   Specimen Description LEFT ANTECUBITAL  Final   Special Requests   Final    BOTTLES DRAWN AEROBIC AND ANAEROBIC Blood Culture adequate volume   Culture   Final    NO GROWTH 2 DAYS Performed at [facility], [address], [place], [place] [address]    Report Status PENDING  Incomplete  Blood Culture (routine x 2)     Status: None (Preliminary result)   Collection Time: 06/03/17  4:05 AM  Result Value Ref Range Status    Specimen Description RIGHT ANTECUBITAL  Final   Special Requests   Final    BOTTLES DRAWN AEROBIC AND ANAEROBIC Blood Culture adequate volume   Culture   Final    NO GROWTH 2 DAYS Performed at [facility], [place] [address] [place], [place] [address]    Report Status PENDING  Incomplete  Culture, body fluid-bottle     Status: None (Preliminary result)   Collection Time: 06/03/17 11:51 AM  Result Value Ref Range Status   Specimen Description FLUID PERITONEAL  Final   Special Requests NONE  Final   Culture   Final    NO GROWTH 2 DAYS Performed at [facility] [address], [place], [place] [address]    Report Status PENDING  Incomplete  Gram stain     Status: None   Collection Time: 06/03/17 11:51 AM  Result Value Ref Range Status   Specimen Description FLUID PERITONEAL  Final   Special Requests NONE  Final   Gram Stain   Final    ABUNDANT WBC PRESENT,BOTH PMN AND MONONUCLEAR NO ORGANISMS SEEN Performed at [facility], [address] [place], [place] [address]    Report Status 06/03/2017 FINAL  Final    Time coordinating discharge: Approximately 40 minutes  Vance Gather, MD  [facility] 06/05/2017, 12:43 PM Pager [phone]

## 2017-06-05 NOTE — Progress Notes (Signed)
[facility] Liaison:  RN  Patient was approved.  Left message for Cookie, CMRN.  No DME pending.    Thank you,  Edyth Gunnels, RN, BSN [facility] Liaison [phone]  All hospital liaisons are now on [place].

## 2017-06-05 NOTE — Telephone Encounter (Signed)
lvm on home phone to inform pt of 8/28 appts at 1115 am per sch msg. Unable to sch appts on 8/27 due to infusion being capped

## 2017-06-05 NOTE — Progress Notes (Signed)
CRITICAL VALUE ALERT  Critical Value:  Lactic Acid-2.1  Date & Time Notied:  06/03/17  Provider Notified: Dr. Bonner Puna  Orders Received/Actions taken: Rounding,on the unit to see patient

## 2017-06-05 NOTE — Procedures (Signed)
Ultrasound-guided therapeutic paracentesis performed yielding 2 liters of slightly hazy, yellow fluid. No immediate complications.

## 2017-06-05 NOTE — Telephone Encounter (Signed)
Amy called office this morning and I confirmed with Larene Beach that Dr Dennard Schaumann would be the attending physician

## 2017-06-07 ENCOUNTER — Telehealth: Payer: Self-pay

## 2017-06-07 NOTE — Telephone Encounter (Signed)
Call placed to patient's wife to regarding his appointments. Patient's wife verbalizes understanding of next appointment on 8/20. Pt's wife appreciative of call back.

## 2017-06-08 LAB — CULTURE, BLOOD (ROUTINE X 2)
CULTURE: NO GROWTH
Culture: NO GROWTH
SPECIAL REQUESTS: ADEQUATE
SPECIAL REQUESTS: ADEQUATE

## 2017-06-08 LAB — CULTURE, BODY FLUID W GRAM STAIN -BOTTLE

## 2017-06-08 LAB — CULTURE, BODY FLUID-BOTTLE: CULTURE: NO GROWTH

## 2017-06-09 ENCOUNTER — Encounter: Payer: Self-pay | Admitting: *Deleted

## 2017-06-10 ENCOUNTER — Ambulatory Visit: Payer: Medicare Other | Admitting: Oncology

## 2017-06-10 ENCOUNTER — Telehealth: Payer: Self-pay | Admitting: *Deleted

## 2017-06-10 ENCOUNTER — Other Ambulatory Visit: Payer: Medicare Other

## 2017-06-10 ENCOUNTER — Ambulatory Visit: Payer: Medicare Other

## 2017-06-10 DIAGNOSIS — C250 Malignant neoplasm of head of pancreas: Secondary | ICD-10-CM

## 2017-06-10 MED ORDER — OXYCODONE HCL 5 MG PO CAPS: 5.0000 mg | ORAL_CAPSULE | ORAL | 0 refills | Status: AC | PRN

## 2017-06-10 NOTE — Telephone Encounter (Signed)
Message from pt's friend reporting he will not make it in to visit today. Requesting refill of Oxycodone and order for supplemental O2 for hospice RN. Returned call, spoke with [place], Hospice RN. Pt will need narcotic script faxed to [facility]. They have hospice orders for PRN O2. Wife is requesting paracentesis today or tomorrow. She doesn't think he needs to be seen today. Appt will be canceled.

## 2017-06-11 ENCOUNTER — Ambulatory Visit (HOSPITAL_COMMUNITY)
Admission: RE | Admit: 2017-06-11 | Discharge: 2017-06-11 | Disposition: A | Discharge status: Home / Self Care | Source: Ambulatory Visit | Attending: Oncology | Admitting: Oncology

## 2017-06-11 ENCOUNTER — Encounter (HOSPITAL_COMMUNITY): Payer: Self-pay | Admitting: Interventional Radiology

## 2017-06-11 DIAGNOSIS — C250 Malignant neoplasm of head of pancreas: Secondary | ICD-10-CM

## 2017-06-11 DIAGNOSIS — C259 Malignant neoplasm of pancreas, unspecified: Secondary | ICD-10-CM | POA: Insufficient documentation

## 2017-06-11 DIAGNOSIS — R180 Malignant ascites: Secondary | ICD-10-CM | POA: Diagnosis not present

## 2017-06-11 HISTORY — PX: IR PARACENTESIS: IMG2679

## 2017-06-11 MED ORDER — LIDOCAINE HCL (PF) 1 % IJ SOLN
INTRAMUSCULAR | Status: DC | PRN
  Administered 2017-06-11: 10 mL

## 2017-06-11 MED ORDER — LIDOCAINE HCL (PF) 1 % IJ SOLN
INTRAMUSCULAR | Status: AC
  Filled 2017-06-11: qty 30

## 2017-06-11 NOTE — Procedures (Signed)
Ultrasound-guided therapeutic paracentesis performed yielding 3 liters of serous, icteric colored fluid. No immediate complications.  Renold Kozar E 2:15 PM 06/11/2017

## 2017-06-12 ENCOUNTER — Other Ambulatory Visit (HOSPITAL_COMMUNITY): Payer: Medicare Other

## 2017-06-13 ENCOUNTER — Telehealth: Payer: Self-pay | Admitting: *Deleted

## 2017-06-13 NOTE — Telephone Encounter (Signed)
Message from [place], hospice RN, requesting refill for Penicillin to be called to pharmacy if pt is to continue.  Reviewed with Dr. [name]: DC Penicillin after he completes 2 week supply. Left message informing hospice RN. Called and informed pt as well.

## 2017-06-17 ENCOUNTER — Other Ambulatory Visit: Payer: Self-pay

## 2017-06-17 ENCOUNTER — Ambulatory Visit: Payer: Medicare Other | Admitting: Nurse Practitioner

## 2017-06-17 DIAGNOSIS — C250 Malignant neoplasm of head of pancreas: Secondary | ICD-10-CM

## 2017-06-17 NOTE — Progress Notes (Signed)
Spoke with Joseph Art, [place], and let her know that per MD [name] it is ok to increase the dose of Xanax from 0.25mg  to 0.5mg , and that orders are in for a therapeutic paracentesis on 8/31 as requested. Renee, RN verbalizes understanding and states that she will communicate that information to the patient and family.

## 2017-06-18 ENCOUNTER — Other Ambulatory Visit: Payer: Self-pay

## 2017-06-18 ENCOUNTER — Ambulatory Visit: Payer: Medicare Other | Admitting: Nurse Practitioner

## 2017-06-18 ENCOUNTER — Other Ambulatory Visit: Payer: Medicare Other

## 2017-06-18 ENCOUNTER — Ambulatory Visit: Payer: Medicare Other

## 2017-06-18 DIAGNOSIS — C250 Malignant neoplasm of head of pancreas: Secondary | ICD-10-CM

## 2017-06-19 ENCOUNTER — Other Ambulatory Visit: Payer: Self-pay

## 2017-06-19 DIAGNOSIS — C250 Malignant neoplasm of head of pancreas: Secondary | ICD-10-CM

## 2017-06-19 MED ORDER — ALPRAZOLAM 0.5 MG PO TABS: 0.5000 mg | ORAL_TABLET | Freq: Four times a day (QID) | ORAL | 0 refills | Status: AC | PRN

## 2017-06-19 NOTE — Telephone Encounter (Signed)
Call placed to pharmacy to refill Xanax. Called pt's wife to notify her that the pt's rx has been refilled. Pt's wife appreciative of call back.

## 2017-06-21 ENCOUNTER — Ambulatory Visit (HOSPITAL_COMMUNITY)
Admission: RE | Admit: 2017-06-21 | Discharge: 2017-06-21 | Disposition: A | Discharge status: Home / Self Care | Source: Ambulatory Visit | Attending: Oncology | Admitting: Oncology

## 2017-06-21 DIAGNOSIS — C250 Malignant neoplasm of head of pancreas: Secondary | ICD-10-CM | POA: Diagnosis present

## 2017-06-21 DIAGNOSIS — R188 Other ascites: Secondary | ICD-10-CM | POA: Insufficient documentation

## 2017-06-21 MED ORDER — LIDOCAINE HCL 1 % IJ SOLN
INTRAMUSCULAR | Status: AC
  Filled 2017-06-21: qty 10

## 2017-06-21 NOTE — Procedures (Signed)
Ultrasound-guided  therapeutic paracentesis performed yielding 3.9 liters of golden yellow fluid. No immediate complications.

## 2017-06-23 ENCOUNTER — Other Ambulatory Visit: Payer: Self-pay | Admitting: Nurse Practitioner

## 2017-06-23 DIAGNOSIS — C250 Malignant neoplasm of head of pancreas: Secondary | ICD-10-CM

## 2017-06-25 ENCOUNTER — Other Ambulatory Visit: Payer: Self-pay

## 2017-06-25 DIAGNOSIS — C250 Malignant neoplasm of head of pancreas: Secondary | ICD-10-CM

## 2017-06-25 MED ORDER — LEVOTHYROXINE SODIUM 25 MCG PO TABS: 25.0000 ug | ORAL_TABLET | Freq: Every day | ORAL | 0 refills | Status: AC

## 2017-06-25 NOTE — Telephone Encounter (Signed)
Call placed to pt's wife to let her know that orders for a paracentesis are in. Pt's wife states that she will call and get the paracentesis scheduled.

## 2017-06-28 ENCOUNTER — Ambulatory Visit (HOSPITAL_COMMUNITY): Admission: RE | Admit: 2017-06-28 | Payer: Medicare Other | Source: Ambulatory Visit

## 2017-06-28 ENCOUNTER — Telehealth: Payer: Self-pay | Admitting: *Deleted

## 2017-06-28 NOTE — Telephone Encounter (Signed)
Message from [place], [facility] RN, reporting pt will go to [facility] today. He is bedbound and has become agitated and combative. Dr. [name] made aware.

## 2017-06-28 NOTE — Telephone Encounter (Addendum)
Spoke with pt's wife, support given. Informed her Dr. [name] will see pt at [facility] on 9/11. She was glad to hear that. Encouraged her to call office with questions or concerns.

## 2017-07-22 DEATH — deceased

## 2017-07-30 ENCOUNTER — Other Ambulatory Visit: Payer: Self-pay | Admitting: Radiology

## 2017-07-31 ENCOUNTER — Other Ambulatory Visit (HOSPITAL_COMMUNITY): Payer: Medicare Other

## 2017-09-25 ENCOUNTER — Other Ambulatory Visit: Payer: Self-pay | Admitting: Nurse Practitioner

## 2018-08-12 IMAGING — RF DG ERCP WO/W SPHINCTEROTOMY
1 series · 4 of 4 positions shown · non-contrast
Comparison: 10/16/2016 and 10/13/2016

CLINICAL DATA: Primary pancreatic adenocarcinoma and biliary
obstruction.

EXAM:
ERCP
TECHNIQUE: Multiple spot images obtained with the fluoroscopic device and
submitted for interpretation post-procedure.
FLUOROSCOPY TIME:  Fluoroscopy Time:  2 minutes and 37 seconds
Number of Acquired Spot Images: 4

[Series 1: run · 4 of 4 slices shown]
[im 1/4]
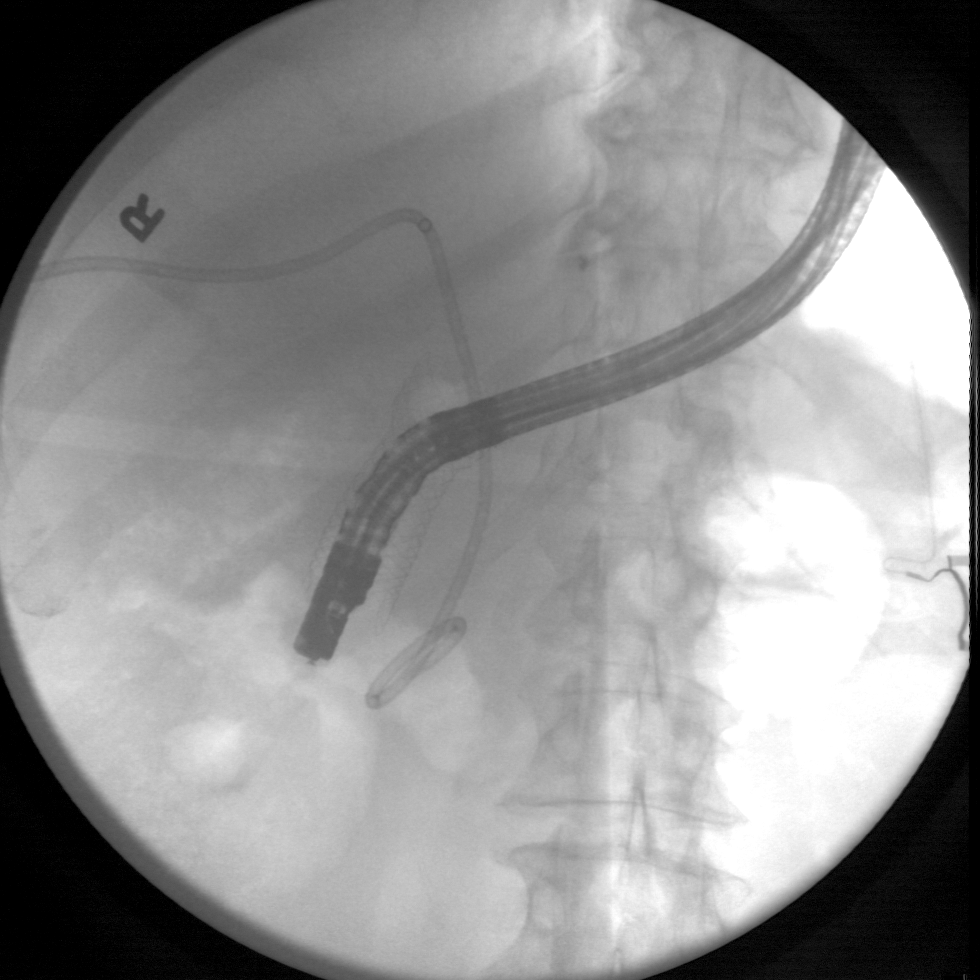
[im 2/4]
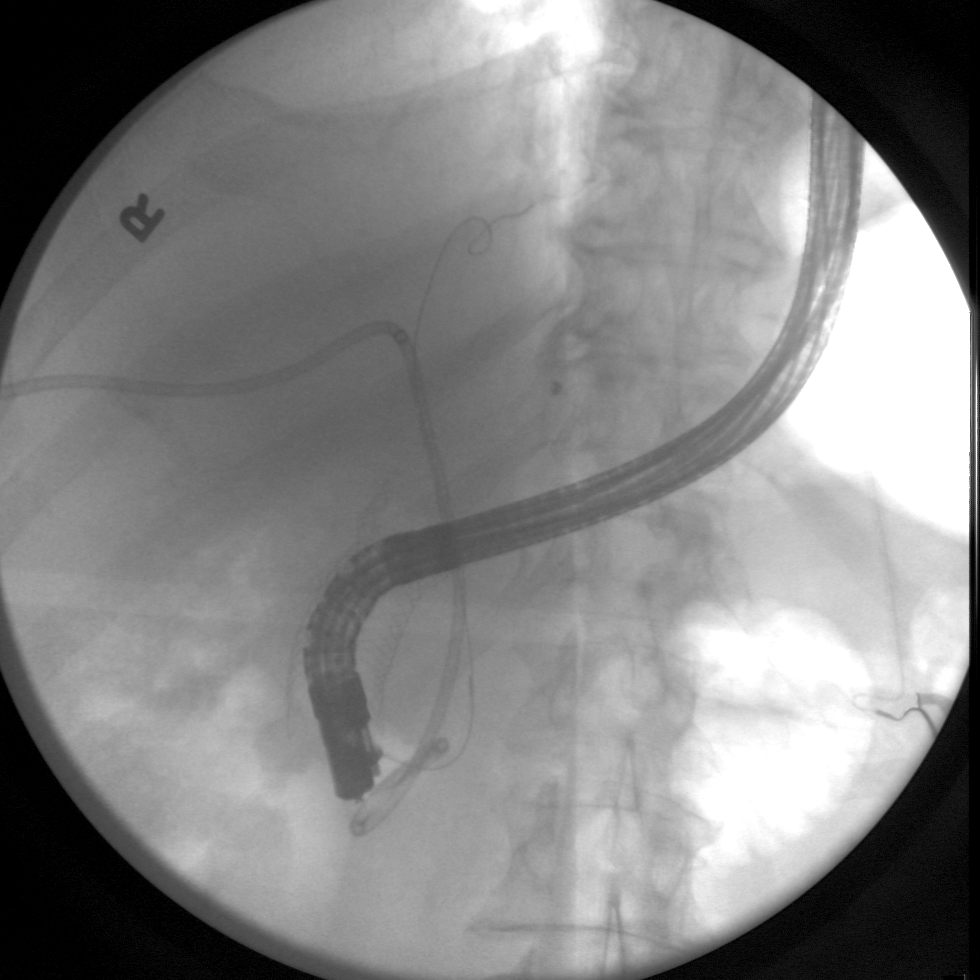
[im 3/4]
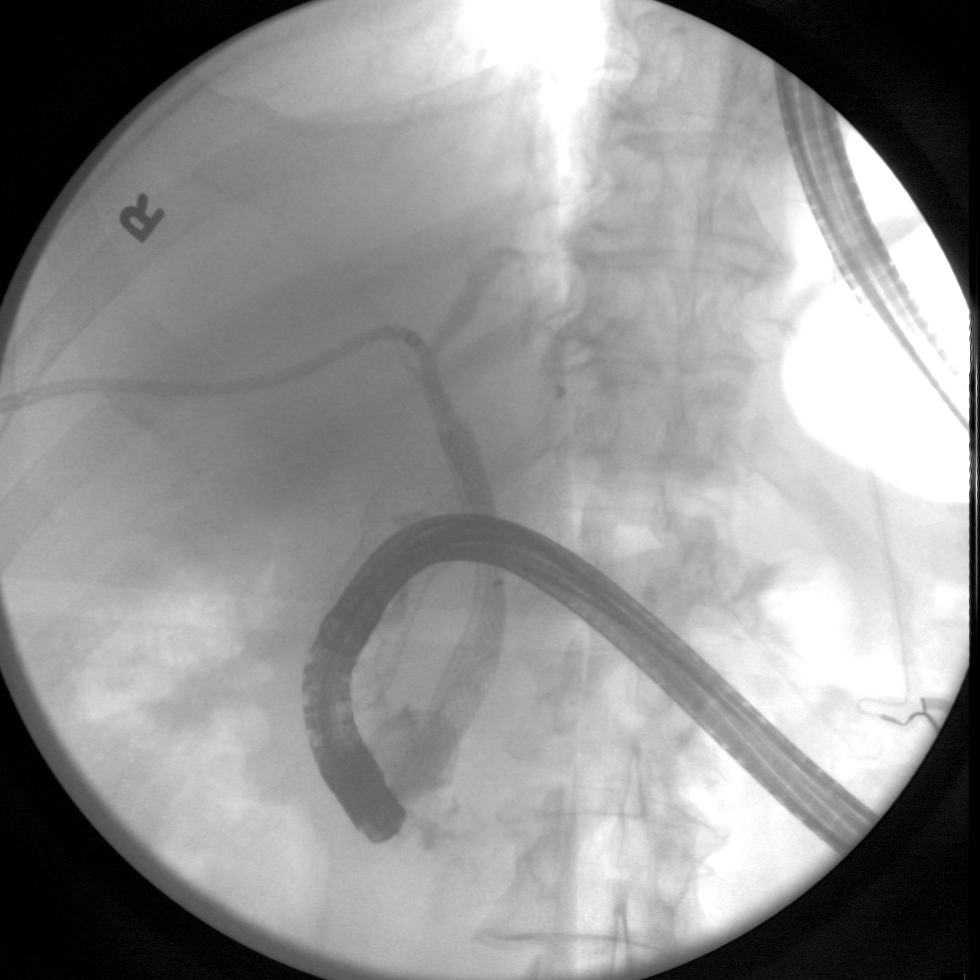
[im 4/4]
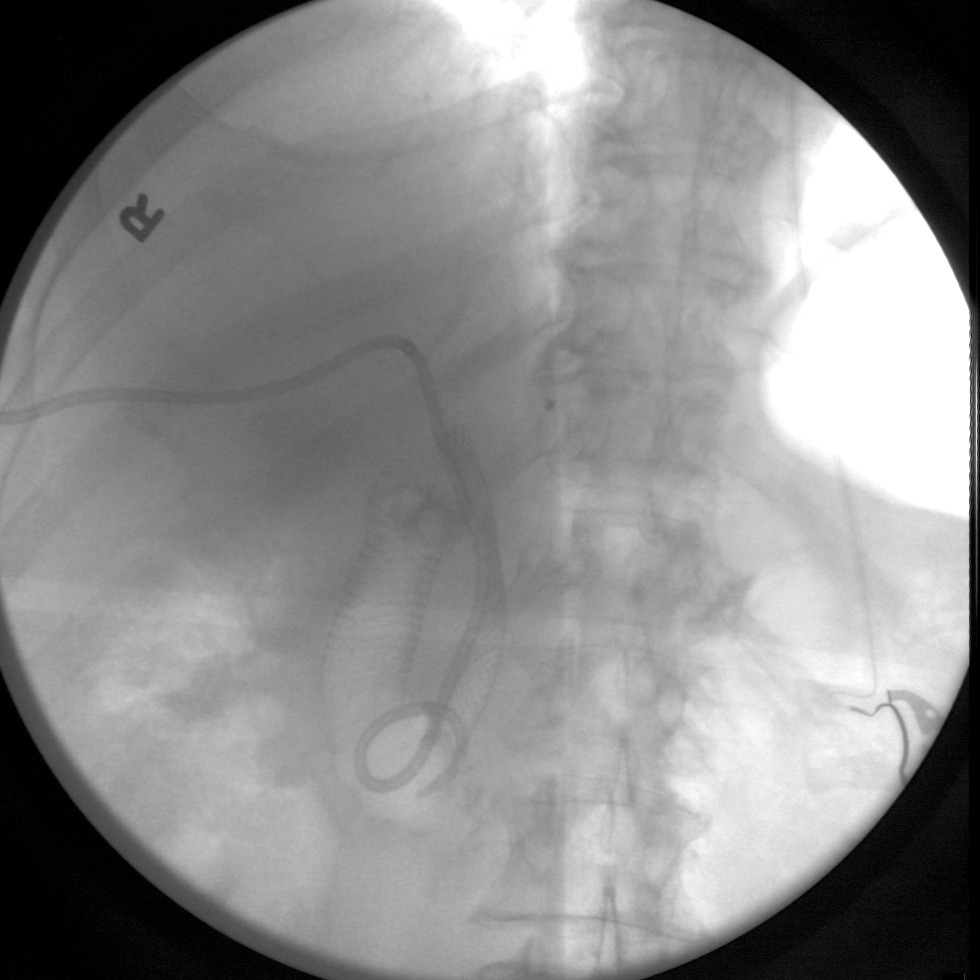

[4 of 4 positions shown; findings below may reference images not displayed]

FINDINGS: The metallic biliary stent has been removed. Patient continues to
have a duodenal stent. Again noted is a right-sided
internal/external biliary drain. Common bile duct was cannulated and
a wire was advanced into the intrahepatic bile ducts. A new metallic
biliary stent was placed in the common bile duct. Contrast
demonstrates patency of the new biliary stent.
IMPRESSION: Placement of a new metallic biliary stent.

These images were submitted for radiologic interpretation only.
Please see the procedural report for the amount of contrast and the
fluoroscopy time utilized.

## 2019-02-23 IMAGING — CT CT ABD-PELV W/ CM
2 of 5 series · 15 of 46 positions shown, 17 images · IV contrast (APPLIED)
Comparison: CT abdomen dated 04/12/2017.

CLINICAL DATA: Pt with medical history significant of elevated
liver enzymes, GERD, hyperlipidemia, hypertension, obstructive
jaundice, oxalate nephropathy, primary pancreatic adenocarcinoma
with metastases to liver and duodenum, pruritus, presenting to the
emergency department with complaints of several days of fever.

Positive blood cultures with g negative rods drawn 2 days ago.
EXAM:
CT ABDOMEN AND PELVIS WITH CONTRAST
TECHNIQUE: Multidetector CT imaging of the abdomen and pelvis was performed
using the standard protocol following bolus administration of
intravenous contrast.
CONTRAST:  100mL UICVGM-G22 IOPAMIDOL (UICVGM-G22) INJECTION 61%

[Series 2: axial st · axial · 0.78mm/px · z∈[-355,+80]mm · 12 of 101 slices shown, 14 images]
[im 7/101  soft-tissue]
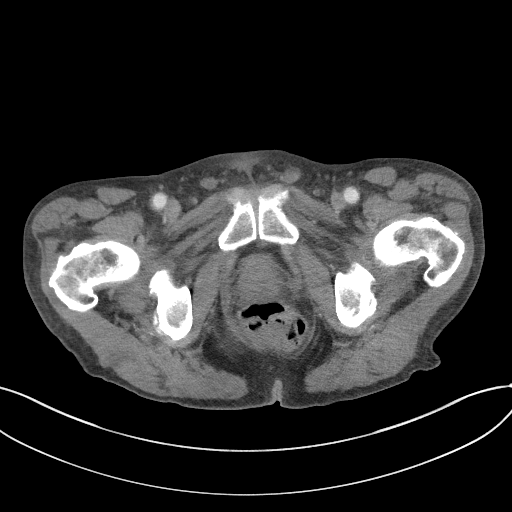
[im 7/101  bone]
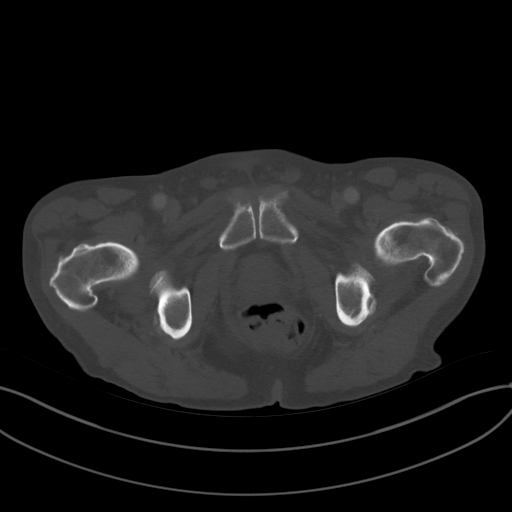
[im 14/101  soft-tissue]
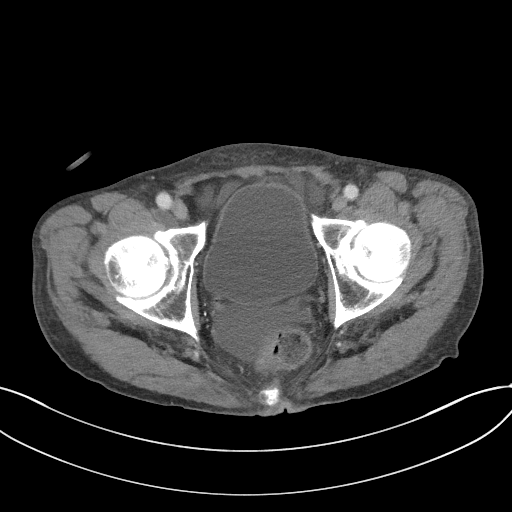
[im 21/101  soft-tissue]
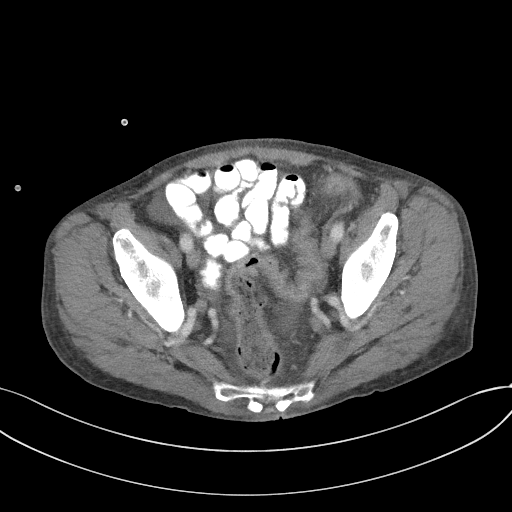
[im 34/101  soft-tissue]
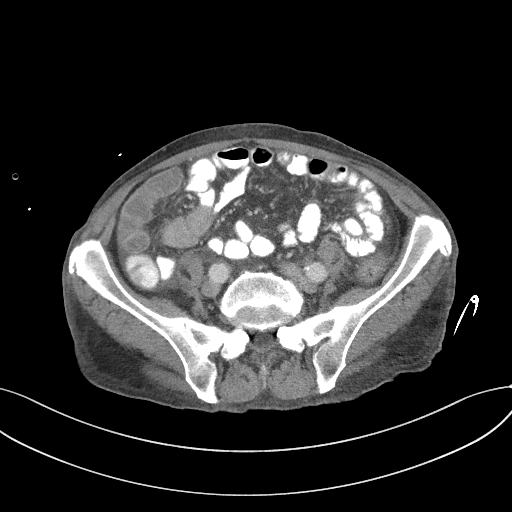
[im 41/101  soft-tissue]
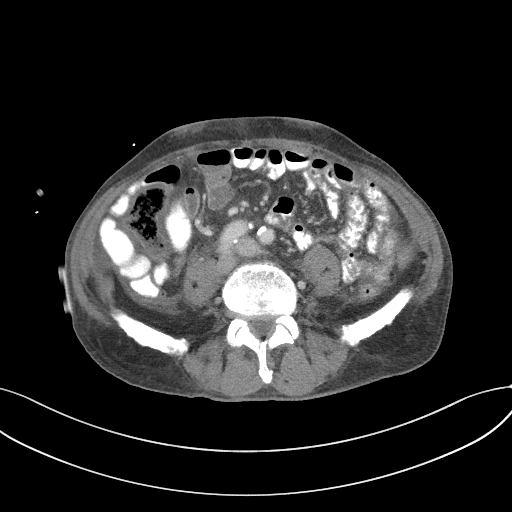
[im 47/101  soft-tissue]
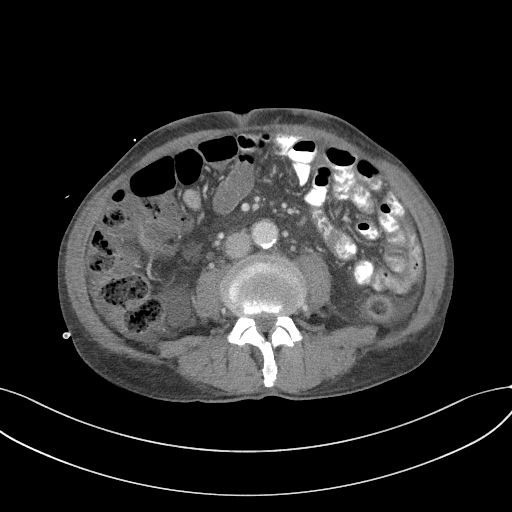
[im 54/101  soft-tissue]
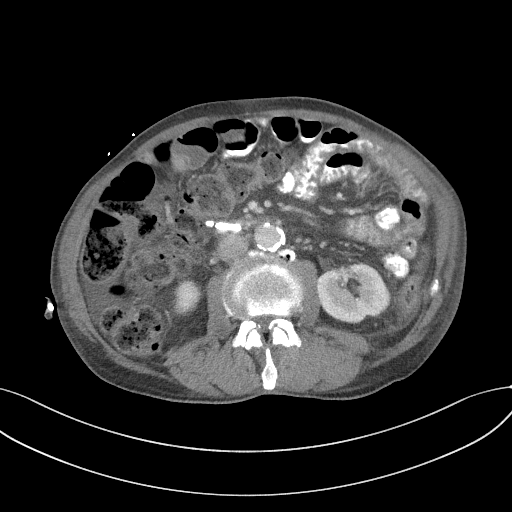
[im 61/101  soft-tissue]
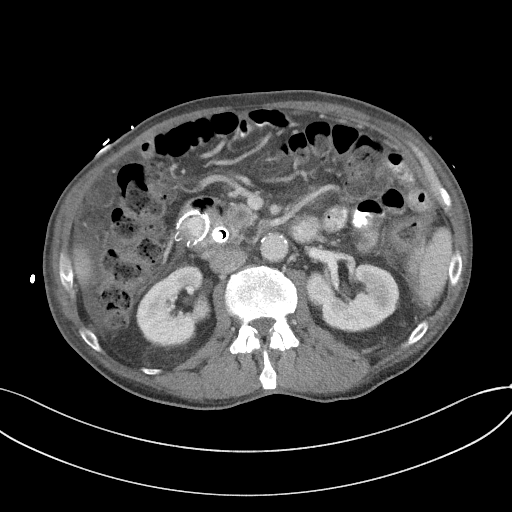
[im 67/101  soft-tissue]
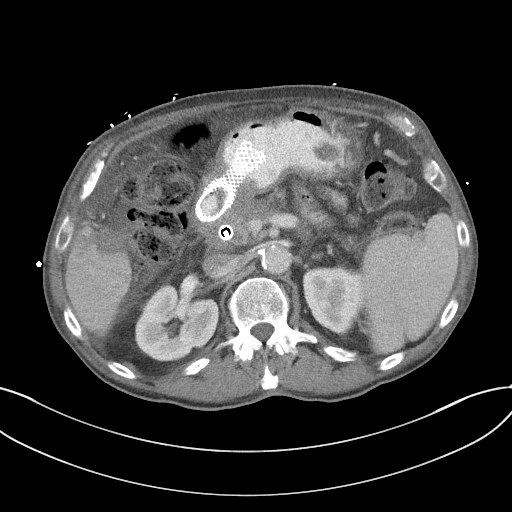
[im 67/101  bone]
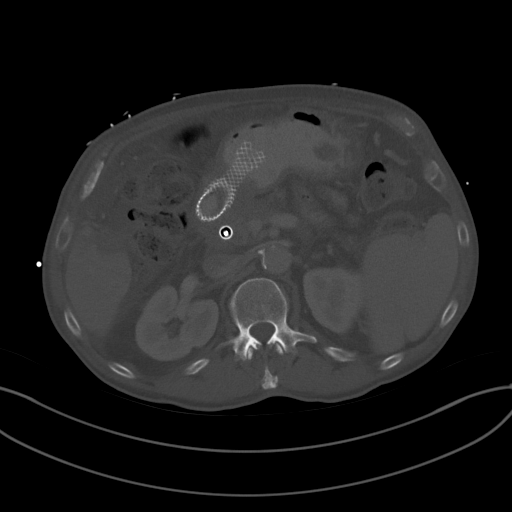
[im 81/101  soft-tissue]
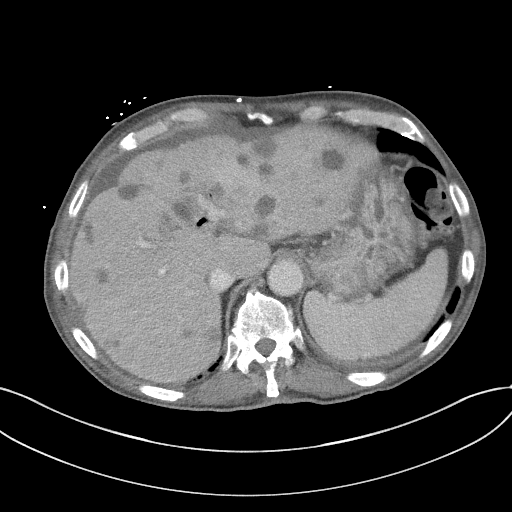
[im 87/101  soft-tissue]
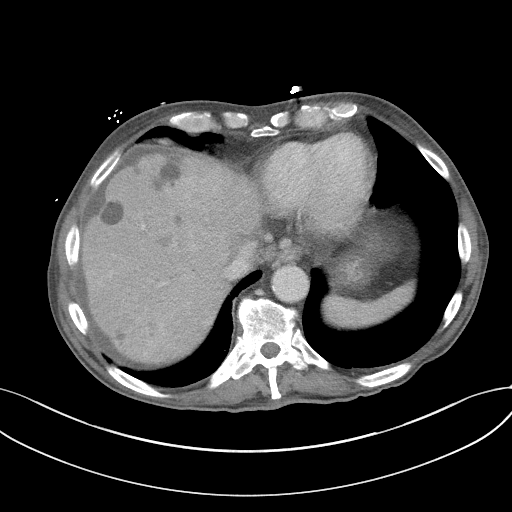
[im 94/101  soft-tissue]
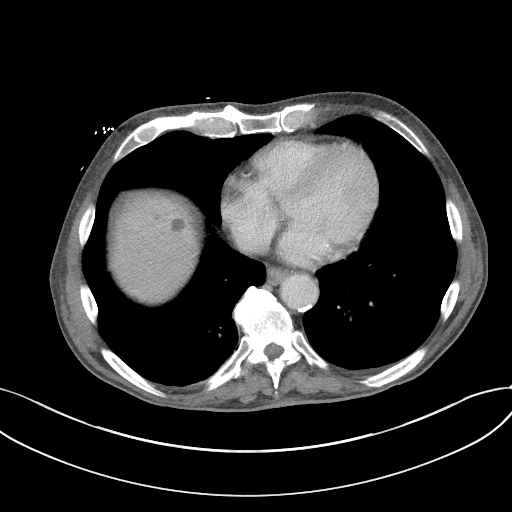

[Series 4: coronal st · coronal · 0.76mm/px · 3 of 87 slices shown]
[im 29/87  soft-tissue]
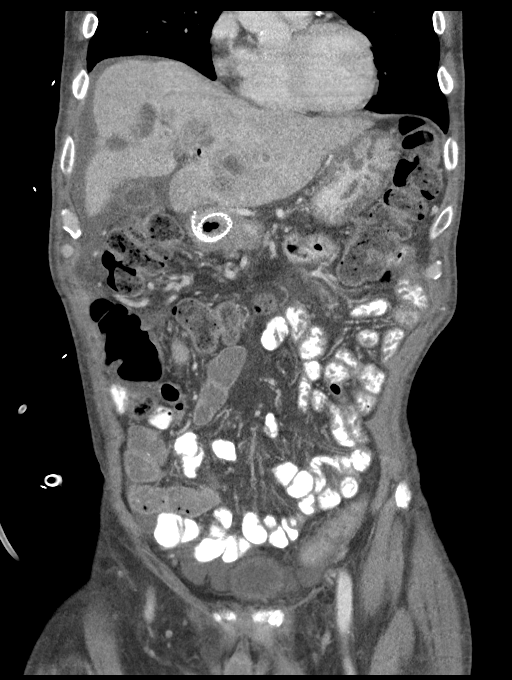
[im 39/87  soft-tissue]
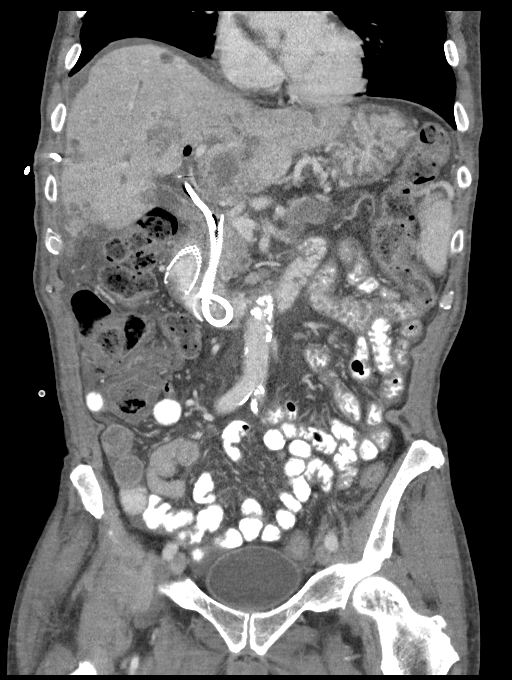
[im 48/87  soft-tissue]
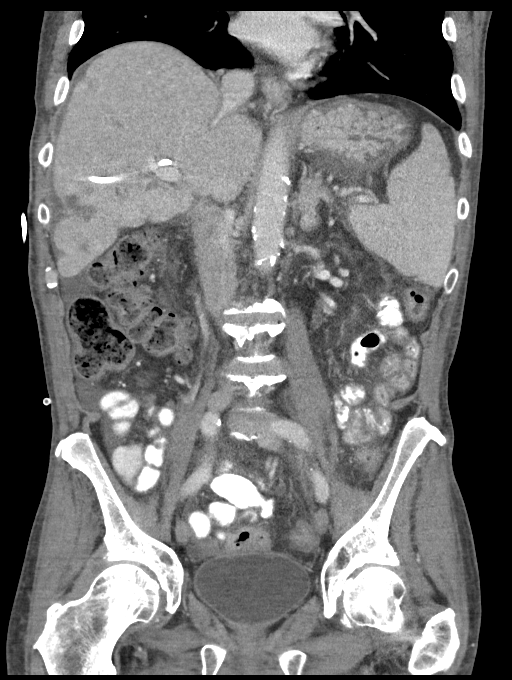

[15 of 46 positions shown; findings below may reference images not displayed]

FINDINGS: Lower chest: Lung bases are clear.

Hepatobiliary: Diffuse metastases throughout the bilateral liver
lobes. Many of the lesions have increased in size compared to the
recent CT abdomen of 04/12/2017. Most conspicuous changes seen
within the left liver lobe where essentially all of the previously
demonstrated lesions in the left liver lobe have enlarged in the
interval. There is a new prominent lesion within segment 4B of the
left liver lobe which measures 4.3 cm greatest dimension.

Again noted is pneumobilia. Percutaneous biliary drain appears
stable in position, traversing a common bile duct stent which also
appears stable in position. Gallbladder is not distended.

Pancreas: Stable appearance of the pancreas, with pancreatic atrophy
and dilatation of the main pancreatic duct.

Spleen: Splenomegaly, stable.

Adrenals/Urinary Tract: Adrenal glands are unremarkable. Kidneys
appear normal without mass, stone or hydronephrosis. Bladder appears
normal.

Stomach/Bowel: No dilated large or small bowel loops. Walls of the
right colon appear thickened, most prominent at the level of the
cecum.

Gastric stent stable in position.

Vascular/Lymphatic: Aortic atherosclerosis. No enlarged lymph nodes
seen within the abdomen or pelvis.

Reproductive: Prostate is unremarkable.

Other: Moderate amount of free fluid within the right upper
quadrant, right pericolic gutter and pelvis, increased compared to
the previous exam. No circumscribed abscess collection appreciated.
No free intraperitoneal air seen.

Musculoskeletal: No acute or suspicious osseous finding. Mild
degenerative change within the lumbar spine.
IMPRESSION: 1. Worsening liver metastases, many of the lesions have increased in
size compared to the recent CT abdomen of 04/12/2017, and a new mass
is identified within segment 4B of the left liver lobe which
measures 4.3 cm. Alternatively, given the abnormal blood cultures, 1
or more of these liver lesions (including the new dominant lesion
within the left liver lobe) could represent liver abscesses but this
is considered less likely than worsening liver metastases.
2. Thickening of the walls of the right colon, particularly
prominent thickening of the walls of the cecum, suggesting colitis
of infectious or inflammatory nature.
3. Moderate amount of free fluid in the right upper quadrant, right
pericolic gutter and pelvis, increased compared to the previous CT.
4. Multiple stents and drains, stable in position, as detailed
above.
5. Aortic atherosclerosis.

## 2019-02-24 IMAGING — XA IR EXCHANGE BILARY DRAIN
2 series · 8 of 8 positions shown · non-contrast
Comparison: none

INDICATION: 71-year-old male with a history of biliary obstruction, with
previously placed common bile duct stent and internal/ external
biliary drain.

[Series 2: fl - angio · 4 of 69 frames shown]
[frame 11/69]
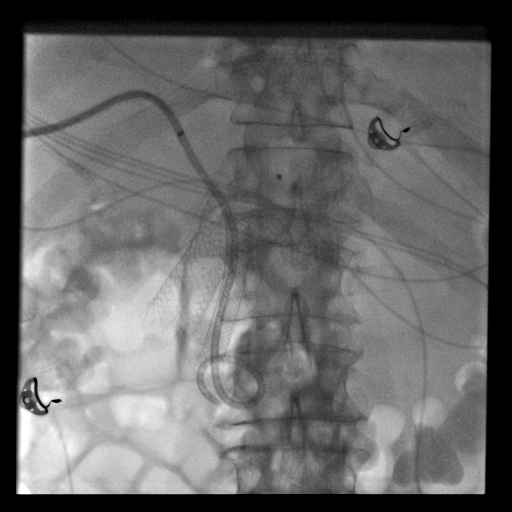
[frame 12/69]
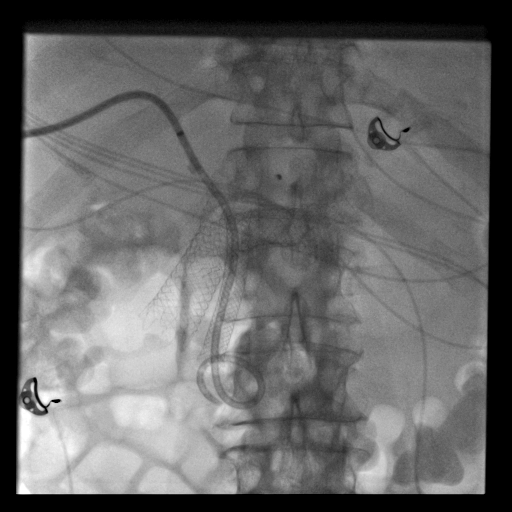
[frame 35/69]
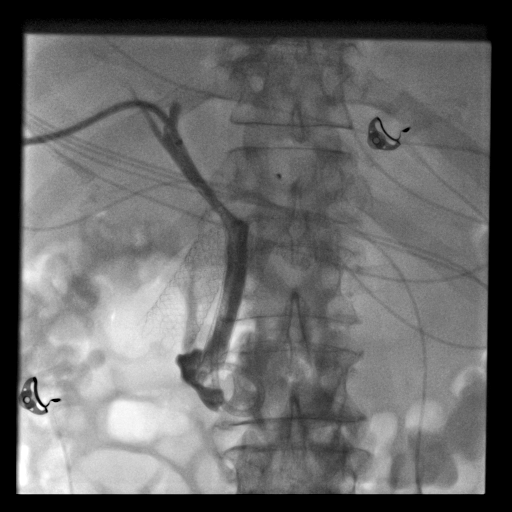
[frame 59/69]
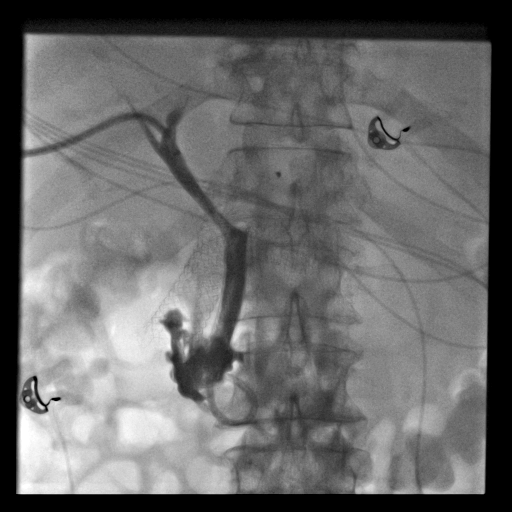

[Series 300: tube placements · 4 of 4 slices shown]
[im 1/4]
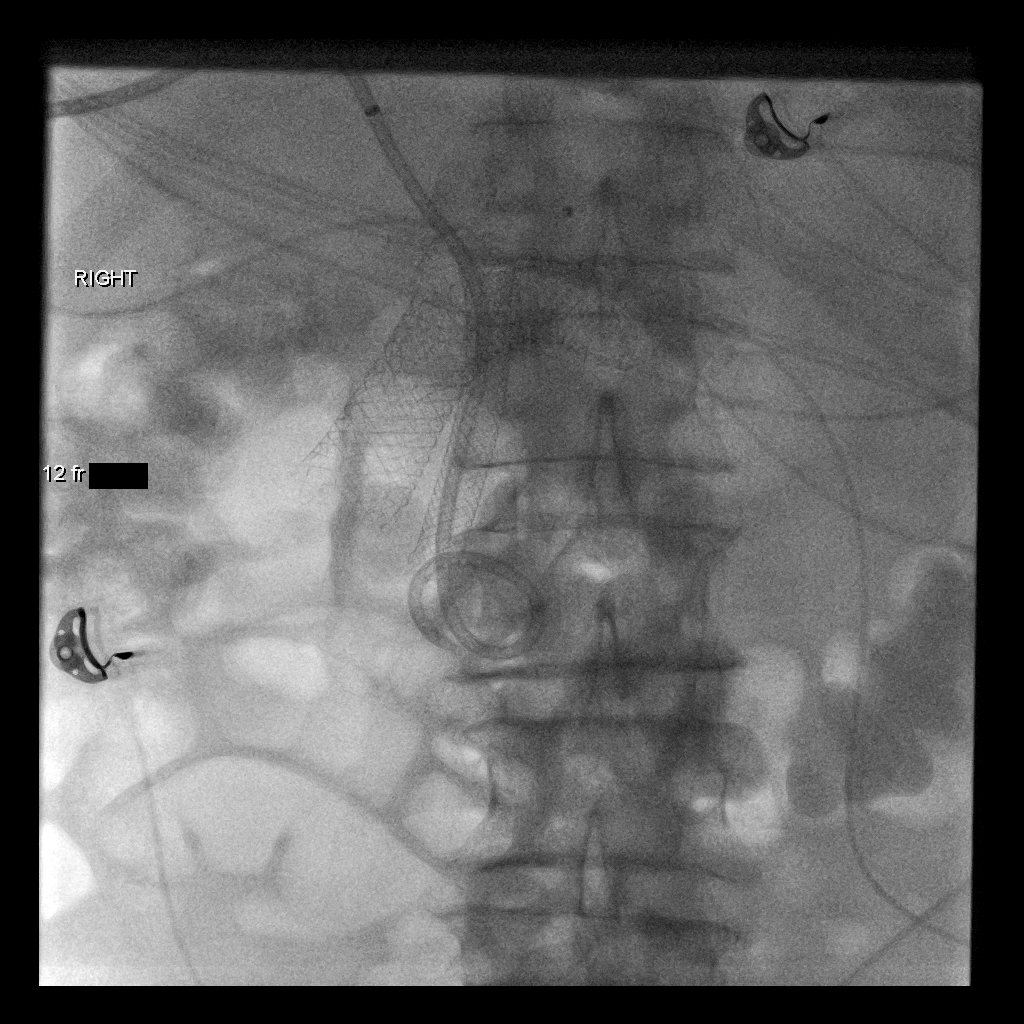
[im 2/4]
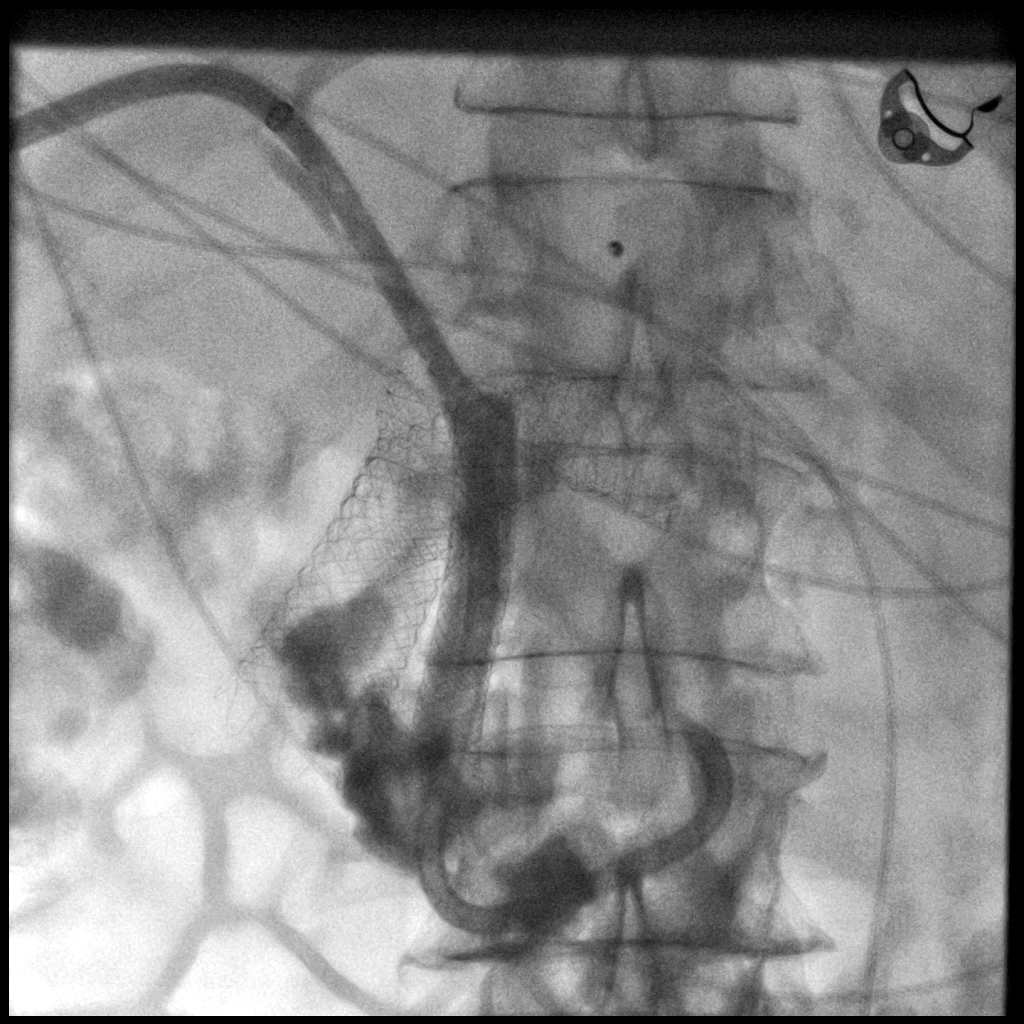
[im 3/4]
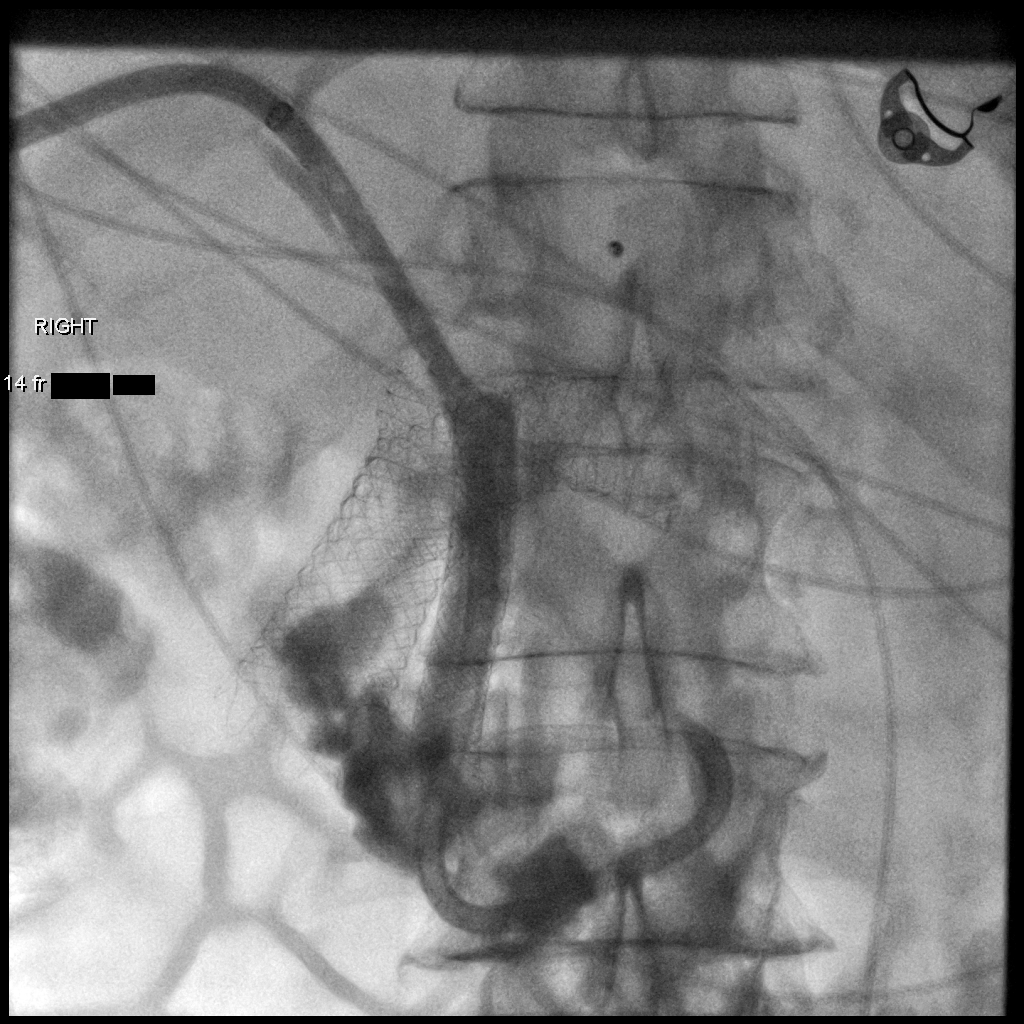
[im 4/4]
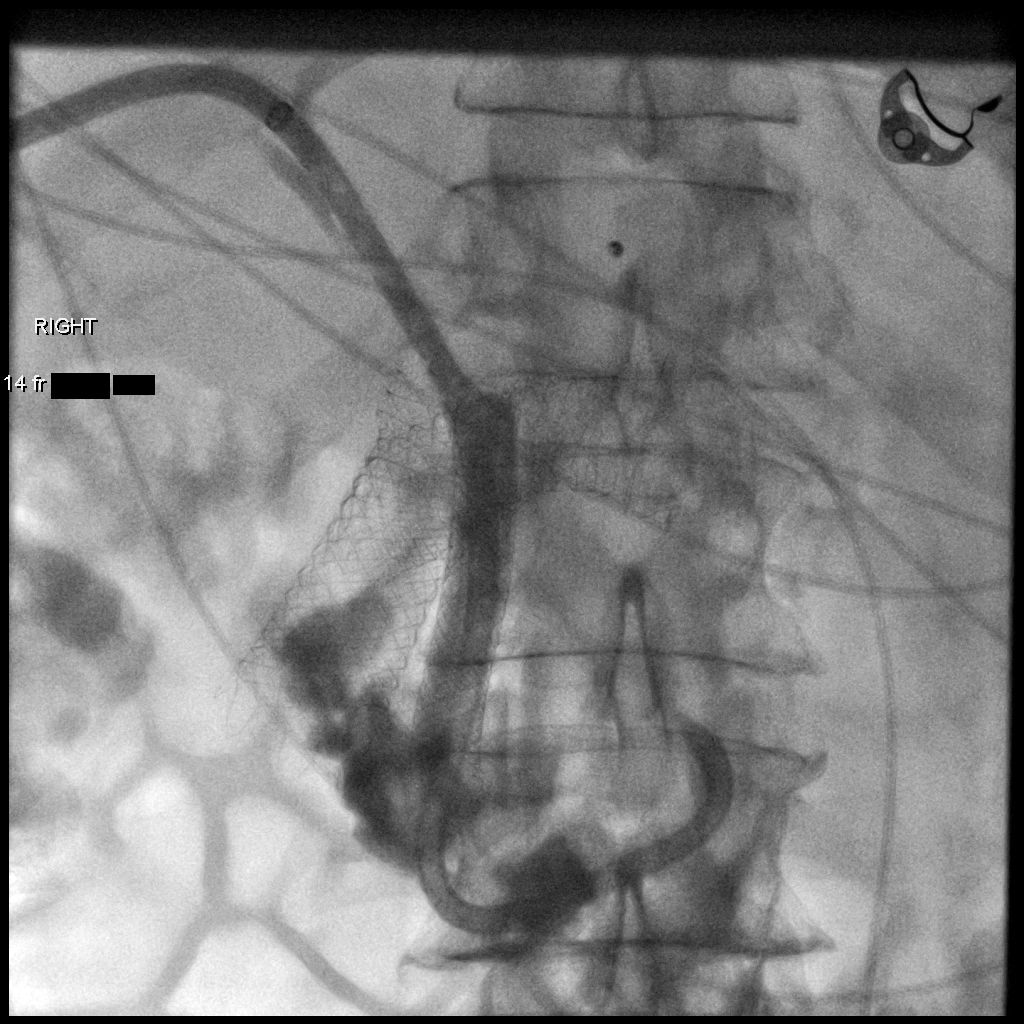

[8 of 8 positions shown; findings below may reference images not displayed]

He presents with recurrent episode of cholangitis, with possible
obstruction of the drain

EXAM:
IMAGE GUIDED DRAIN INJECTION AND EXCHANGE

MEDICATIONS:
4 mg IV Zofran; The antibiotic was administered within an
appropriate time frame prior to the initiation of the procedure.

ANESTHESIA/SEDATION:
None

FLUOROSCOPY TIME:  Fluoroscopy Time: 1 minutes 12 seconds (24 mGy).

COMPLICATIONS:
None

PROCEDURE:
Informed written consent was obtained from the patient after a
thorough discussion of the procedural risks, benefits and
alternatives. All questions were addressed. Maximal Sterile Barrier
Technique was utilized including caps, mask, sterile gowns, sterile
gloves, sterile drape, hand hygiene and skin antiseptic. A timeout
was performed prior to the initiation of the procedure.

Patient positioned supine position on the IR table. Scout images
were acquired.

Patient is prepped and draped. Contrast injection confirmed a least
partial occlusion of the indwelling 12 French drain which was
appropriately positioned across the stent into the duodenum.

1% lidocaine was used for local anesthesia.

Using modified Seldinger technique, a new 14 French drain was
placed.

Final image was stored.

Patient tolerated the procedure well and remained hemodynamically
stable throughout.

No complications were encountered and no significant blood loss.
IMPRESSION: Status post exchange of partially obstructed internal external
biliary drain with placement of a new 14 French drain.
